# Patient Record
Sex: Female | Born: 1940
Health system: Southern US, Community
[De-identification: ages and names within clinical notes are randomized; demographics above are authoritative.]

## PROBLEM LIST (undated history)

## (undated) DIAGNOSIS — I10 Essential (primary) hypertension: Secondary | ICD-10-CM

## (undated) DIAGNOSIS — R569 Unspecified convulsions: Secondary | ICD-10-CM

## (undated) DIAGNOSIS — R112 Nausea with vomiting, unspecified: Secondary | ICD-10-CM

## (undated) DIAGNOSIS — Z9889 Other specified postprocedural states: Secondary | ICD-10-CM

## (undated) DIAGNOSIS — I639 Cerebral infarction, unspecified: Secondary | ICD-10-CM

## (undated) DIAGNOSIS — R479 Unspecified speech disturbances: Secondary | ICD-10-CM

## (undated) DIAGNOSIS — R011 Cardiac murmur, unspecified: Secondary | ICD-10-CM

## (undated) DIAGNOSIS — E785 Hyperlipidemia, unspecified: Secondary | ICD-10-CM

## (undated) DIAGNOSIS — J189 Pneumonia, unspecified organism: Secondary | ICD-10-CM

## (undated) DIAGNOSIS — E1140 Type 2 diabetes mellitus with diabetic neuropathy, unspecified: Secondary | ICD-10-CM

## (undated) DIAGNOSIS — K219 Gastro-esophageal reflux disease without esophagitis: Secondary | ICD-10-CM

## (undated) DIAGNOSIS — A498 Other bacterial infections of unspecified site: Secondary | ICD-10-CM

## (undated) DIAGNOSIS — R42 Dizziness and giddiness: Secondary | ICD-10-CM

## (undated) DIAGNOSIS — K8020 Calculus of gallbladder without cholecystitis without obstruction: Secondary | ICD-10-CM

## (undated) DIAGNOSIS — F17200 Nicotine dependence, unspecified, uncomplicated: Secondary | ICD-10-CM

## (undated) DIAGNOSIS — Z923 Personal history of irradiation: Secondary | ICD-10-CM

## (undated) DIAGNOSIS — C801 Malignant (primary) neoplasm, unspecified: Secondary | ICD-10-CM

## (undated) DIAGNOSIS — I6529 Occlusion and stenosis of unspecified carotid artery: Secondary | ICD-10-CM

## (undated) HISTORY — DX: Occlusion and stenosis of unspecified carotid artery: I65.29

## (undated) HISTORY — PX: KNEE SURGERY: SHX244

## (undated) HISTORY — DX: Hyperlipidemia, unspecified: E78.5

## (undated) HISTORY — DX: Nicotine dependence, unspecified, uncomplicated: F17.200

## (undated) HISTORY — PX: OTHER SURGICAL HISTORY: SHX169

## (undated) HISTORY — PX: EYE SURGERY: SHX253

## (undated) HISTORY — PX: CHOLECYSTECTOMY: SHX55

## (undated) HISTORY — DX: Other bacterial infections of unspecified site: A49.8

## (undated) HISTORY — DX: Type 2 diabetes mellitus with diabetic neuropathy, unspecified: E11.40

## (undated) HISTORY — DX: Calculus of gallbladder without cholecystitis without obstruction: K80.20

## (undated) HISTORY — DX: Personal history of irradiation: Z92.3

## (undated) HISTORY — DX: Essential (primary) hypertension: I10

## (undated) HISTORY — PX: COLONOSCOPY: SHX174

---

## 2001-03-06 ENCOUNTER — Encounter: Admission: RE | Admit: 2001-03-06 | Discharge: 2001-06-04 | Payer: Self-pay | Admitting: Family Medicine

## 2001-03-28 ENCOUNTER — Emergency Department (HOSPITAL_COMMUNITY): Admission: EM | Admit: 2001-03-28 | Discharge: 2001-03-28 | Payer: Self-pay | Admitting: *Deleted

## 2001-03-28 ENCOUNTER — Encounter: Payer: Self-pay | Admitting: *Deleted

## 2001-08-15 ENCOUNTER — Inpatient Hospital Stay (HOSPITAL_COMMUNITY): Admission: EM | Admit: 2001-08-15 | Discharge: 2001-08-17 | Payer: Self-pay | Admitting: Emergency Medicine

## 2001-08-15 ENCOUNTER — Encounter: Payer: Self-pay | Admitting: Emergency Medicine

## 2004-12-31 ENCOUNTER — Emergency Department (HOSPITAL_COMMUNITY): Admission: EM | Admit: 2004-12-31 | Discharge: 2004-12-31 | Payer: Self-pay | Admitting: Emergency Medicine

## 2006-02-27 ENCOUNTER — Ambulatory Visit: Payer: Self-pay | Admitting: Family Medicine

## 2006-03-01 ENCOUNTER — Ambulatory Visit: Payer: Self-pay | Admitting: Family Medicine

## 2006-03-04 ENCOUNTER — Encounter: Admission: RE | Admit: 2006-03-04 | Discharge: 2006-03-04 | Payer: Self-pay | Admitting: Family Medicine

## 2006-03-22 ENCOUNTER — Encounter (INDEPENDENT_AMBULATORY_CARE_PROVIDER_SITE_OTHER): Payer: Self-pay | Admitting: Specialist

## 2006-03-22 ENCOUNTER — Ambulatory Visit (HOSPITAL_COMMUNITY): Admission: RE | Admit: 2006-03-22 | Discharge: 2006-03-23 | Payer: Self-pay | Admitting: General Surgery

## 2006-04-16 ENCOUNTER — Ambulatory Visit: Payer: Self-pay | Admitting: Family Medicine

## 2006-05-29 ENCOUNTER — Ambulatory Visit: Payer: Self-pay | Admitting: Family Medicine

## 2006-09-05 ENCOUNTER — Ambulatory Visit: Payer: Self-pay | Admitting: Family Medicine

## 2006-12-04 ENCOUNTER — Ambulatory Visit: Payer: Self-pay | Admitting: Family Medicine

## 2007-04-03 ENCOUNTER — Ambulatory Visit: Payer: Self-pay | Admitting: Family Medicine

## 2007-06-05 ENCOUNTER — Ambulatory Visit: Payer: Self-pay | Admitting: Family Medicine

## 2007-08-29 ENCOUNTER — Ambulatory Visit: Payer: Self-pay | Admitting: Family Medicine

## 2007-09-04 ENCOUNTER — Ambulatory Visit: Payer: Self-pay | Admitting: Family Medicine

## 2007-09-05 ENCOUNTER — Ambulatory Visit: Payer: Self-pay | Admitting: Family Medicine

## 2007-09-08 ENCOUNTER — Ambulatory Visit: Payer: Self-pay | Admitting: Family Medicine

## 2007-10-10 ENCOUNTER — Ambulatory Visit: Payer: Self-pay | Admitting: Family Medicine

## 2007-12-30 ENCOUNTER — Ambulatory Visit: Payer: Self-pay | Admitting: Family Medicine

## 2008-04-21 ENCOUNTER — Ambulatory Visit: Payer: Self-pay | Admitting: Family Medicine

## 2008-08-30 ENCOUNTER — Ambulatory Visit: Payer: Self-pay | Admitting: Family Medicine

## 2009-06-27 ENCOUNTER — Ambulatory Visit: Payer: Self-pay | Admitting: Family Medicine

## 2009-06-29 ENCOUNTER — Ambulatory Visit: Payer: Self-pay | Admitting: Family Medicine

## 2009-08-04 ENCOUNTER — Ambulatory Visit: Payer: Self-pay | Admitting: Vascular Surgery

## 2009-10-26 ENCOUNTER — Ambulatory Visit: Payer: Self-pay | Admitting: Family Medicine

## 2010-02-01 ENCOUNTER — Ambulatory Visit: Payer: Self-pay | Admitting: Family Medicine

## 2010-06-02 ENCOUNTER — Ambulatory Visit: Payer: Self-pay | Admitting: Family Medicine

## 2010-08-06 ENCOUNTER — Encounter: Payer: Self-pay | Admitting: Family Medicine

## 2010-10-04 ENCOUNTER — Ambulatory Visit: Payer: Self-pay | Admitting: Family Medicine

## 2010-10-11 ENCOUNTER — Ambulatory Visit (INDEPENDENT_AMBULATORY_CARE_PROVIDER_SITE_OTHER): Payer: Medicare Other | Admitting: Family Medicine

## 2010-10-11 DIAGNOSIS — F17200 Nicotine dependence, unspecified, uncomplicated: Secondary | ICD-10-CM

## 2010-10-11 DIAGNOSIS — I10 Essential (primary) hypertension: Secondary | ICD-10-CM

## 2010-10-11 DIAGNOSIS — Z79899 Other long term (current) drug therapy: Secondary | ICD-10-CM

## 2010-10-11 DIAGNOSIS — E119 Type 2 diabetes mellitus without complications: Secondary | ICD-10-CM

## 2010-10-11 DIAGNOSIS — E785 Hyperlipidemia, unspecified: Secondary | ICD-10-CM

## 2010-12-01 NOTE — Op Note (Signed)
[name], [name] NO.:  [id number]   MEDICAL RECORD NO.:  [id number]          PATIENT TYPE:  AMB   LOCATION:  SDS                          FACILITY:  [facility]   PHYSICIAN:  Cherylynn Ridges, M.D.    DATE OF BIRTH:  1941/06/03   DATE OF PROCEDURE:  03/22/2006  DATE OF DISCHARGE:                                 OPERATIVE REPORT   SURGEON:  Cherylynn Ridges, M.D.   PREOPERATIVE DIAGNOSIS:  Symptomatic cholelithiasis in a diabetic.   POSTOPERATIVE DIAGNOSIS:  Symptomatic cholelithiasis in a diabetic.   PROCEDURE:  Laparoscopic cholecystectomy with cholangiogram.   ANESTHESIA:  General endotracheal.   ESTIMATED BLOOD LOSS:  Less than 20 cc.   COMPLICATIONS:  None.   CONDITION:  Stable.   FINDINGS:  Subacute cholecystitis with a large gallbladder stone in the neck  of the gallbladder.  Intraoperative cholangiogram was normal.   INDICATIONS FOR OPERATION:  The patient is a 71 year old with a very  symptomatic gallstone in the neck of the gallbladder, who comes in now for a  laparoscopic cholecystectomy.   OPERATION:  The patient was taken to the operating room and placed on the  table in a supine position.  After an adequate endotracheal anesthetic was  administered, she was prepped and draped in the usual sterile manner,  exposing the midline in the right upper quadrant.   A supraumbilical curvilinear incision was made using a #11 blade and taken  down to the midline fascia.  It was at this fascial level that Kocher clamps  were grabbed onto the fascia and an incision made between the Kocher clamps  using a #15 blade.  We then were able to bluntly dissect down into the  peritoneal cavity, and once we had opened into the peritoneal cavity  bluntly, we passed a pursestring suture of 0 Vicryl into the peritoneal  cavity with a Hasson cannula.  The pursestring suture was used to secure the  Hasson cannula in place.  We then insufflated carbon dioxide gas up to a  maximum intra-abdominal pressure of 15 mmHg.  Two right costal margin 5-mm  cannulae and a subxiphoid 11-12 mm cannula were passed under direct vision  into the peritoneal cavity.  Once they were all in place, the patient was  placed in reverse Trendelenburg, the left side was tilted down and then  dissection begun.   The dome of the gallbladder was grasped using a ratcheted grasper through  the lateral-most 5-mm cannula and retracted towards the anterior abdominal  wall in the right upper quadrant.  We then passed a second grasper onto the  infundibulum of the gallbladder, and then dissected out the peritoneum  overlying the triangle of Calot and the hepatoduodenal triangle.  Both the  cystic duct and the cystic artery were identified.  We clipped the cystic  artery proximally x2, and distally, and then transected it.  And then we  clipped the cystic duct along the gallbladder side and made a  cholecystodochotomy with the laparoscopic scissors, and then passed a Cook  catheter, which had been passed through  the anterior abdominal wall, into  the cholecystodochotomy.  We performed a cholangiogram, which showed good  flow into the duodenum, no evidence of intraductal filling defect, no  obstruction and good proximal flow.   Once the cholangiogram was completed, the clip holding the catheter in place  was removed, and then the cholangiocatheter removed without difficulty.  We  then clipped the distal cystic duct x2, and then transected it.  We  dissected out the gallbladder from its bed with minimal difficulty, and then  retrieved it from the supraumbilical site, having to enlarge both the  fascial and the skin incision because of the large size of the stone.  We  then placed a second suture at the fascial side of the periumbilical  incision, tying down the pursestring suture, and then placing in a second  suture.  We inspected the gallbladder bed and right upper quadrant for bile   staining and bleeding, and there was minimal of each.  We then irrigated  with a small amount of saline solution, aspirated all fluid and gas from  above the liver, and then removed all cannulae.   The skin at all sites was closed using a running subcuticular stitch of 4-0  Vicryl.  Marcaine 0.25% with epi was injected at all sites, and all needle  counts, sponge counts and instrument counts were correct, as we applied  sterile dressings to all wounds.      Cherylynn Ridges, M.D.  Electronically Signed     JOW/MEDQ  D:  03/22/2006  T:  03/22/2006  Job:  [id number]   cc:   Baird Lyons

## 2010-12-01 NOTE — Op Note (Signed)
[place]  Patient:    [name], [name] Visit Number: [id number] MRN: [id number]          Service Type: Attending:  Harvie Junior, M.D. Dictated by:   Harvie Junior, M.D. Proc. Date: 08/15/01                             Operative Report  PREOPERATIVE DIAGNOSIS:  Comminuted displaced patellar fracture.  POSTOPERATIVE DIAGNOSIS:  Comminuted displaced patellar fracture.  OPERATION PERFORMED:  Partial inferior pole patellectomy with suturing of the patellar tendon to the posterior aspect of the remaining patella.  SURGEON:  Harvie Junior, M.D.  ASSISTANT:  Currie Paris. Thedore Mins.  ANESTHESIA:  General.  INDICATIONS FOR PROCEDURE:  The patient is a 70 year old female with a long history of fallen and having a comminuted fracture of the patella.  She was evaluated and felt to have this comminuted fracture.  She was taken to the operating room for fixation versus excision and patellar tendon repair.  DESCRIPTION OF PROCEDURE:  The patient was taken to the operating room where adequate anesthesia was obtained with general anesthetic.  The patient was placed supine on the operating table.  The left leg was then prepped and draped in the usual sterile fashion.  Following arthroscopic exsanguination of the extremity, blood pressure tourniquet was inflated to 350 mHg.  Following this, a midline incision was made and the subcutaneous tissues dissected down to the level of the comminuted patellar fracture.  The patella was fractured in a comminuted way such that the portion that was articular was actually only about 10% of the articular surface.  It went in a diagonal direction from there but this was nonarticular.  I did not feel that these fragments could be attached in a way that would allow for early motion and given that there was only a 10% articular portion, I felt that repair of the patellar tendon to the remaining fragment was appropriate.   This was undertaken with #5 Ethibond sutures times three through the tendon, tied over four drill holes into the patella.  Excellent fixation was achieved and the patient could be flexed to about 90 without any significant tension on the repair.  At this point the retinaculum was closed medially and laterally.  The wounds were then copiously irrigated and suctioned dry.  A sterile compressive dressing was applied.  The patient was taken to the recovery room where she was noted to be in satisfactory condition.  Estimated blood loss for this procedure was none. Total tourniquet time was an hour and 10 minutes. Dictated by:   Harvie Junior, M.D. Attending:  Harvie Junior, M.D. DD:  11/05/01 TD:  11/06/01 Job: [id number]

## 2010-12-01 NOTE — Discharge Summary (Signed)
[place]  Patient:    [name], [name] Visit Number: [id number] MRN: [id number]          Service Type: MED Location: [phone] Attending Physician:  Milly Jakob Dictated by:   Currie Paris Thedore Mins. Admit Date:  08/15/2001 Discharge Date: 08/17/2001   CC:         Ronnald Nian, M.D.  Harvie Junior, M.D.   Discharge Summary  ADMITTING DIAGNOSES: 1. Displaced patella fracture left knee. 2. History of hypertension. 3. History of noninsulin-dependent diabetes mellitus.  DISCHARGE DIAGNOSES: 1. Displaced patella fracture left knee. 2. History of hypertension. 3. History of noninsulin-dependent diabetes mellitus.  PROCEDURES IN HOSPITAL:  Partial patellectomy left knee with extensor mechanism repair, Jodi Geralds, M.D. on August 15, 2001.  BRIEF HISTORY:  The patient is a pleasant 70 year old patient who is followed medically by Dr. Sharlot Gowda for hypertension and noninsulin-dependent diabetes. She was at work today and fell on some ice, striking her left knee. She had onset of pain and swelling in her left knee and had difficulty doing a straight leg raise. She came to the [facility] Emergency Room where her left leg was evaluated and x-rays confirmed a displaced comminuted inferior patellar fracture. She was brought to the operating room for open reduction internal fixation of her patellar fracture versus partial patellectomy and extensor mechanism repair depending upon the ability to fix the patella adequately and get it reduced to our liking. She was admitted for this.  LABORATORY AND ACCESSORY DATA:  EKG on admission showed normal sinus rhythm. Preoperative chest x-ray showed upper limits of normal heart size with pulmonary vascular congestion without definite edema.  Hemoglobin on admission was 15.5 with a hematocrit of 44.7. WBC was 13.5, indices were within normal limits. Protime on admission was 12.7  seconds with an INR of 0.9, PTT was 26. On the day of discharge on Coumadin and anti-thrombolytic therapy, her protime was 15.6 seconds with an INR of 1.3. Her BMET on admission was within normal limits other than an elevated glucose at 228.  HOSPITAL COURSE:  The patient underwent left knee surgery as well described in Dr. Luiz Blare operative note on August 15, 2001. Postoperatively she was put on Ancef 1 gram IV q.8h x 5 doses. She was also started on Coumadin and anti-thrombolytic therapy per pharmacy protocol for DVT prophylaxis as we felt that she would be somewhat limited in her activity and was at risk for DVT. Postoperatively she was seen by physical therapy for walker ambulation, partial weight bearing on the left, IV fluids were used to hydrate the patient. Foley catheter that was in place at the time of surgery was intact. On postoperative day #1, she was without complaints. She had no chest pain or shortness of breath. She was taking fluids without difficulty. She had a low grade fever of 99.9. Her vital signs were stable. She progressed with physical therapy and on postoperative day #2, she had moderate knee pain. She was taking fluids without difficulty. The Foley catheter was discontinued, and she voided without difficulty. She had spiked fever up to 100.8, and her vital signs were stable. Her protime was 15.6 seconds and INR 1.3. The left knee dressing was changed. Her IV and Foley catheter had been removed. She was discharged home that day after being seen by physical therapy. She was instructed to ambulate weight bearing as tolerated on the left with a knee mobilizer and a walker. She  was given a prescription for Percocet p.r.n. for pain and Coumadin per pharmacy protocol x1 month postoperatively. She will be set up for home health, physical therapy, and home health R.N. blood draws for protimes, and we will follow her up in our office in 2 weeks. She will also continue  her usual hypertension and diabetes medication by Dr. Richardo Hanks. Dictated by:   Currie Paris Thedore Mins. Attending Physician:  Milly Jakob DD:  09/03/01 TD:  09/03/01 Job: [id number]

## 2010-12-28 ENCOUNTER — Telehealth: Payer: Self-pay | Admitting: Family Medicine

## 2010-12-28 MED ORDER — VERAPAMIL HCL ER 180 MG PO TBCR: 180.0000 mg | EXTENDED_RELEASE_TABLET | Freq: Every day | ORAL | Status: DC

## 2010-12-28 MED ORDER — SITAGLIPTIN PHOS-METFORMIN HCL 50-1000 MG PO TABS: 1.0000 | ORAL_TABLET | Freq: Two times a day (BID) | ORAL | Status: DC

## 2010-12-28 NOTE — Telephone Encounter (Signed)
Verapamil and Janumet were called in

## 2011-01-04 ENCOUNTER — Encounter: Payer: Self-pay | Admitting: Family Medicine

## 2011-01-04 DIAGNOSIS — E1159 Type 2 diabetes mellitus with other circulatory complications: Secondary | ICD-10-CM | POA: Insufficient documentation

## 2011-01-04 DIAGNOSIS — I152 Hypertension secondary to endocrine disorders: Secondary | ICD-10-CM | POA: Insufficient documentation

## 2011-01-04 DIAGNOSIS — F17200 Nicotine dependence, unspecified, uncomplicated: Secondary | ICD-10-CM

## 2011-01-04 DIAGNOSIS — E1140 Type 2 diabetes mellitus with diabetic neuropathy, unspecified: Secondary | ICD-10-CM

## 2011-01-04 DIAGNOSIS — E785 Hyperlipidemia, unspecified: Secondary | ICD-10-CM

## 2011-01-04 DIAGNOSIS — E1169 Type 2 diabetes mellitus with other specified complication: Secondary | ICD-10-CM | POA: Insufficient documentation

## 2011-01-04 DIAGNOSIS — F17210 Nicotine dependence, cigarettes, uncomplicated: Secondary | ICD-10-CM | POA: Insufficient documentation

## 2011-01-04 DIAGNOSIS — E119 Type 2 diabetes mellitus without complications: Secondary | ICD-10-CM | POA: Insufficient documentation

## 2011-01-04 DIAGNOSIS — I10 Essential (primary) hypertension: Secondary | ICD-10-CM

## 2011-02-06 ENCOUNTER — Encounter: Payer: Self-pay | Admitting: Family Medicine

## 2011-02-09 ENCOUNTER — Ambulatory Visit (INDEPENDENT_AMBULATORY_CARE_PROVIDER_SITE_OTHER): Payer: Medicare Other | Admitting: Family Medicine

## 2011-02-09 ENCOUNTER — Encounter: Payer: Self-pay | Admitting: Family Medicine

## 2011-02-09 DIAGNOSIS — F17200 Nicotine dependence, unspecified, uncomplicated: Secondary | ICD-10-CM

## 2011-02-09 DIAGNOSIS — E119 Type 2 diabetes mellitus without complications: Secondary | ICD-10-CM

## 2011-02-09 DIAGNOSIS — E785 Hyperlipidemia, unspecified: Secondary | ICD-10-CM

## 2011-02-09 DIAGNOSIS — E1169 Type 2 diabetes mellitus with other specified complication: Secondary | ICD-10-CM

## 2011-02-09 DIAGNOSIS — I10 Essential (primary) hypertension: Secondary | ICD-10-CM

## 2011-02-09 DIAGNOSIS — E1159 Type 2 diabetes mellitus with other circulatory complications: Secondary | ICD-10-CM

## 2011-02-09 LAB — POCT GLYCOSYLATED HEMOGLOBIN (HGB A1C): Hemoglobin A1C: 6.9

## 2011-02-09 NOTE — Patient Instructions (Signed)
Continue on your present medications. Will discuss the smoking later

## 2011-02-09 NOTE — Progress Notes (Signed)
  Subjective:    Patient ID: [name], female    DOB: Dec 12, 1940, 70 y.o.   MRN: [id number]  HPI She is here for a diabetes recheck. She continues on medications listed in the chart. She continues to smoke and is not ready to quit. She checks her blood sugars 3 times per day. Next eye exam is in October per she checks her feet regularly . Her exercise is minimal. She has been under a lot of stress over the last several months handling her brother's estate.  Review of Systems     Objective:   Physical Exam Alert and in no distress. Hemoglobin A1c is 6.9       Assessment & Plan:   Diabetes. Hypertension. Hyperlipidemia. Cigarette abuse

## 2011-03-02 ENCOUNTER — Other Ambulatory Visit: Payer: Self-pay | Admitting: Family Medicine

## 2011-03-06 ENCOUNTER — Other Ambulatory Visit: Payer: Self-pay | Admitting: Family Medicine

## 2011-03-21 ENCOUNTER — Ambulatory Visit (INDEPENDENT_AMBULATORY_CARE_PROVIDER_SITE_OTHER): Payer: Medicare Other | Admitting: Medical

## 2011-03-21 ENCOUNTER — Encounter: Payer: Self-pay | Admitting: Medical

## 2011-03-21 VITALS — BP 122/78 | HR 68 | Temp 98.0°F | Resp 16 | Ht 67.0 in | Wt 152.0 lb

## 2011-03-21 DIAGNOSIS — R209 Unspecified disturbances of skin sensation: Secondary | ICD-10-CM

## 2011-03-21 DIAGNOSIS — F17200 Nicotine dependence, unspecified, uncomplicated: Secondary | ICD-10-CM

## 2011-03-21 DIAGNOSIS — G5620 Lesion of ulnar nerve, unspecified upper limb: Secondary | ICD-10-CM

## 2011-03-21 DIAGNOSIS — J329 Chronic sinusitis, unspecified: Secondary | ICD-10-CM

## 2011-03-21 DIAGNOSIS — R202 Paresthesia of skin: Secondary | ICD-10-CM

## 2011-03-21 MED ORDER — AMOXICILLIN 875 MG PO TABS: 875.0000 mg | ORAL_TABLET | Freq: Two times a day (BID) | ORAL | Status: AC

## 2011-03-21 NOTE — Progress Notes (Signed)
Subjective:   HPI [name] is a 70 y.o. female who presents for multiple c/o.  She notes two-week history of head pressure, head congestion, cough, sinus pressure, runny nose, scratchy throat, and right ear pressure.  The last time she had a sinus infection was 3 years ago, but thinks she has a sinus infection now. Her grandkids were in town recently to see her and all of them were sick.  She is using nothing for her symptoms currently.  She notes for the last 4 days having some intermittent numbness in her right fourth and fifth fingers.  At times the thumb has numbness as well.  She notes having similar symptoms in the remote past, but nothing in the last several months until the last 4 days.  She is right-handed, is on her computer a lot, plays video games and online poker, in addition to gardening and canning.  She also notes that she does rest her elbows and arms on the table or desk when on the computer.  She denies any unilateral weakness, no recent vision changes, slurred speech, memory change, or dizziness.  No other aggravating or relieving factors.  No other c/o.  The following portions of the patient's history were reviewed and updated as appropriate: allergies, current medications, past family history, past medical history, past social history, past surgical history and problem list.  Past Medical History  Diagnosis Date  . Hypertension   . Diabetes mellitus   . Neuropathy, diabetic   . Dyslipidemia   . Smoker    Review of Systems Gen.: No fever, chills, sweats, fatigue Allergies: No sneezing, itchy eyes Neck: No neck pain Lungs: + Cough; No shortness of breath, wheezing Heart: No chest pain, palpitations, lower extremity edema GI: No abdominal pain, nausea, vomiting Neuro: No slurred speech, mental status change, loss of consciousness, memory changes Muscle skeletal: No arthralgias, myalgias, joint swelling, gait changes    Objective:   Physical Exam  General  appearance: alert, no distress, WD/WN, white female Skin: Unremarkable HEENT: Conjunctiva normal, TMs pearly, nares patent, mild sinus tenderness, pharynx with mild erythema Neck: Nontender, normal range of motion, no bruit, supple, no thyromegaly Heart: RRR, normal S1, S2 no murmurs Lungs: Decreased breath sounds in general, but no rhonchi, wheezes, rales Extremities: No edema, no cyanosis, no clubbing Neuro: Alert and oriented x3, CN II through XII intact, normal strength and sensation throughout, DTRs normal, Phalen's and Tinel's negative, no gait abnormality, no focal signs    Assessment :    Encounter Diagnoses  Name Primary?  . Sinusitis Yes  . Ulnar nerve external compression syndrome   . Paresthesia   . Tobacco use disorder       Plan:     Sinusitis- prescription for amoxicillin today, rest, hydrate well, can use over-the-counter Mucinex DM. Cough not improving.  Ulnar nerve compression syndrome/paresthesias -  given her history and symptoms, I suspect between the computer use or resting her elbows on the table, and that she is putting pressure on her ulnar nerve. I suspect she has both mild ulnar nerve and carpal tunnel syndrome. Advise she avoid resting her arms and elbows on the desk, take frequent breaks when on the computer, abduct and wrap her right arm in a towel to keep it straight during sleep, and don't sleep on that side.  If this continues to worsen we can get EMG/nerve conduction studies.  I reassured her that her current symptoms and exam do not suggest stroke.  Discussed signs  of TIA/stroke, and when to call 911.  Tobacco use disorder- discussed the risk of smoking, and advise she stopped smoking. She notes that she is not ready to quit at this time, and she says that Dr. Susann Givens, her [facility], has spoken to her many times about her tobacco use.

## 2011-03-21 NOTE — Patient Instructions (Signed)
Sinus infection - increase your water intake.  Take OTC Mucinex DM or Coricidin HBP for congestion and cough.  Rest, and begin Amoxicillin twice daily for 10 days.  Call if you are not improving.  Finger numbness - your symptoms suggests Ulnar nerve syndrome and possibly mild carpal tunnel syndrome.  Avoid sitting with arms propped up with the elbow area resting on the table.  This may be worsening your symptoms.  Have your husband wrap your arm in a towel at night straight to avoid bending your arm in your sleep.  If this continues to worsen, let us know.  STOP SMOKING!!!!

## 2011-03-30 ENCOUNTER — Telehealth: Payer: Self-pay | Admitting: Family Medicine

## 2011-03-30 MED ORDER — FLUCONAZOLE 150 MG PO TABS: 150.0000 mg | ORAL_TABLET | Freq: Once | ORAL | Status: AC

## 2011-03-30 NOTE — Telephone Encounter (Signed)
Diflucan called in for yeast infection secondary to antibiotic use

## 2011-03-30 NOTE — Telephone Encounter (Signed)
Amoxil caused yeast infection, wants rx    [facility] [address] [place]

## 2011-03-30 NOTE — Telephone Encounter (Signed)
Pt called re antibiotic

## 2011-03-30 NOTE — Telephone Encounter (Signed)
Let her know that I called an antibiotic in 

## 2011-04-01 ENCOUNTER — Other Ambulatory Visit: Payer: Self-pay | Admitting: Family Medicine

## 2011-04-06 ENCOUNTER — Other Ambulatory Visit: Payer: Self-pay | Admitting: Family Medicine

## 2011-06-12 ENCOUNTER — Ambulatory Visit (INDEPENDENT_AMBULATORY_CARE_PROVIDER_SITE_OTHER): Payer: Medicare Other | Admitting: Family Medicine

## 2011-06-12 ENCOUNTER — Encounter: Payer: Self-pay | Admitting: Family Medicine

## 2011-06-12 DIAGNOSIS — E785 Hyperlipidemia, unspecified: Secondary | ICD-10-CM

## 2011-06-12 DIAGNOSIS — E1140 Type 2 diabetes mellitus with diabetic neuropathy, unspecified: Secondary | ICD-10-CM

## 2011-06-12 DIAGNOSIS — Z23 Encounter for immunization: Secondary | ICD-10-CM

## 2011-06-12 DIAGNOSIS — F17200 Nicotine dependence, unspecified, uncomplicated: Secondary | ICD-10-CM

## 2011-06-12 DIAGNOSIS — I10 Essential (primary) hypertension: Secondary | ICD-10-CM

## 2011-06-12 DIAGNOSIS — E1149 Type 2 diabetes mellitus with other diabetic neurological complication: Secondary | ICD-10-CM

## 2011-06-12 DIAGNOSIS — Z79899 Other long term (current) drug therapy: Secondary | ICD-10-CM

## 2011-06-12 DIAGNOSIS — E1142 Type 2 diabetes mellitus with diabetic polyneuropathy: Secondary | ICD-10-CM

## 2011-06-12 LAB — CBC WITH DIFFERENTIAL/PLATELET
Basophils Relative: 0 % (ref 0–1)
Hemoglobin: 13.2 g/dL (ref 12.0–15.0)
Lymphocytes Relative: 17 % (ref 12–46)
Lymphs Abs: 2.6 10*3/uL (ref 0.7–4.0)
MCHC: 33.2 g/dL (ref 30.0–36.0)
Monocytes Relative: 6 % (ref 3–12)
Neutro Abs: 11.5 10*3/uL — ABNORMAL HIGH (ref 1.7–7.7)
Neutrophils Relative %: 75 % (ref 43–77)
RBC: 4.35 MIL/uL (ref 3.87–5.11)
WBC: 15.3 10*3/uL — ABNORMAL HIGH (ref 4.0–10.5)

## 2011-06-12 LAB — COMPREHENSIVE METABOLIC PANEL
ALT: 8 U/L (ref 0–35)
AST: 12 U/L (ref 0–37)
BUN: 21 mg/dL (ref 6–23)
CO2: 23 mEq/L (ref 19–32)
Calcium: 9.8 mg/dL (ref 8.4–10.5)
Creat: 0.88 mg/dL (ref 0.50–1.10)
Total Bilirubin: 0.5 mg/dL (ref 0.3–1.2)

## 2011-06-12 LAB — POCT UA - MICROALBUMIN: Creatinine, POC: 87.6 mg/dL

## 2011-06-12 LAB — LIPID PANEL
Cholesterol: 188 mg/dL (ref 0–200)
HDL: 47 mg/dL (ref >39)
Total CHOL/HDL Ratio: 4 Ratio
VLDL: 32 mg/dL (ref 0–40)

## 2011-06-12 LAB — POCT GLYCOSYLATED HEMOGLOBIN (HGB A1C): Hemoglobin A1C: 7

## 2011-06-12 NOTE — Progress Notes (Signed)
  Subjective:    Patient ID: ARLYNN STARE, female    DOB: Aug 15, 1940, 70 y.o.   MRN: [id number]  HPI She is here for recheck. She has not taken her Levemir in the last 3 months due to cost. She is also been under a lot of stress dealing with the death of a brother and his estate. This is now settled. She continues to smoke. At this time she is not ready to quit. She does not exercise regularly. Review of the record indicates she needs followup with her ophthalmologist. She is not sure about her blood pressure medications.   Review of Systems     Objective:   Physical Exam Alert and in no distress. Hemoglobin A1c is 7.0       Assessment & Plan:   1. Diabetes mellitus with neuropathy  POCT HgB A1C, CBC with Differential, Comprehensive metabolic panel, Lipid panel, POCT UA - Microalbumin  2. Dyslipidemia  Lipid panel  3. HTN (hypertension)  CBC with Differential, Comprehensive metabolic panel  4. Smoker    5. Encounter for long-term (current) use of other medications  CBC with Differential, Comprehensive metabolic panel, Lipid panel   The majority of the encounter was spent discussing the stresses she has been under dealing with her brother and the estate. She is glad that this is behind her. Strongly encouraged her to get back to taking better care of yourself regard to her diabetes and in working on smoking cessation. Recheck here in several months.

## 2011-06-12 NOTE — Patient Instructions (Signed)
Continue on your present medications. Keep working on quitting smoking.

## 2011-06-13 ENCOUNTER — Other Ambulatory Visit: Payer: Self-pay | Admitting: Internal Medicine

## 2011-06-13 MED ORDER — ROSUVASTATIN CALCIUM 40 MG PO TABS: 40.0000 mg | ORAL_TABLET | Freq: Every day | ORAL | Status: DC

## 2011-06-13 NOTE — Telephone Encounter (Signed)
Increase crestor 40mg  per Dr. Susann Givens. Sent in to pharmacy

## 2011-07-04 ENCOUNTER — Other Ambulatory Visit: Payer: Self-pay | Admitting: Family Medicine

## 2011-07-24 ENCOUNTER — Other Ambulatory Visit: Payer: Self-pay | Admitting: Family Medicine

## 2011-08-28 ENCOUNTER — Other Ambulatory Visit: Payer: Self-pay | Admitting: Family Medicine

## 2011-09-03 ENCOUNTER — Other Ambulatory Visit: Payer: Self-pay | Admitting: Family Medicine

## 2011-10-04 ENCOUNTER — Other Ambulatory Visit: Payer: Self-pay | Admitting: Family Medicine

## 2011-10-10 ENCOUNTER — Ambulatory Visit (INDEPENDENT_AMBULATORY_CARE_PROVIDER_SITE_OTHER): Payer: Medicare Other | Admitting: Family Medicine

## 2011-10-10 ENCOUNTER — Encounter: Payer: Self-pay | Admitting: Family Medicine

## 2011-10-10 DIAGNOSIS — I10 Essential (primary) hypertension: Secondary | ICD-10-CM

## 2011-10-10 DIAGNOSIS — E119 Type 2 diabetes mellitus without complications: Secondary | ICD-10-CM

## 2011-10-10 DIAGNOSIS — E785 Hyperlipidemia, unspecified: Secondary | ICD-10-CM

## 2011-10-10 DIAGNOSIS — Z79899 Other long term (current) drug therapy: Secondary | ICD-10-CM

## 2011-10-10 DIAGNOSIS — F17200 Nicotine dependence, unspecified, uncomplicated: Secondary | ICD-10-CM

## 2011-10-10 LAB — LIPID PANEL
Cholesterol: 128 mg/dL (ref 0–200)
LDL Cholesterol: 47 mg/dL (ref 0–99)
Total CHOL/HDL Ratio: 2.5 Ratio
Triglycerides: 145 mg/dL (ref <150)
VLDL: 29 mg/dL (ref 0–40)

## 2011-10-10 NOTE — Progress Notes (Signed)
  Subjective:    Patient ID: [name], female    DOB: March 26, 1941, 71 y.o.   MRN: [id number]  HPI She is here for a diabetes recheck. She continues on medications listed in the chart. She does not check her feet regularly or her blood sugars. She continues to smoke and is not interested in quitting. He is not interested in having a colonoscopy done. She has been under a lot of stress dealing with recent family death and difficulties dealing with one of her brothers.  Review of Systems     Objective:   Physical Exam Alert and in no distress. Hemoglobin A1c is 7.5.       Assessment & Plan:   1. Diabetes mellitus  POCT HgB A1C  2. Dyslipidemia    3. HTN (hypertension)    4. Smoker     encouraged her to do a better job of taking care of her self in regard to diet and exercise. Did not discuss smoking since she is not interested in quitting.

## 2011-10-10 NOTE — Patient Instructions (Signed)
Get more physically active.

## 2011-10-11 ENCOUNTER — Encounter: Payer: Self-pay | Admitting: Internal Medicine

## 2011-12-04 ENCOUNTER — Other Ambulatory Visit: Payer: Self-pay | Admitting: Family Medicine

## 2011-12-30 ENCOUNTER — Other Ambulatory Visit: Payer: Self-pay | Admitting: Family Medicine

## 2012-01-03 ENCOUNTER — Other Ambulatory Visit: Payer: Self-pay | Admitting: Family Medicine

## 2012-02-05 ENCOUNTER — Other Ambulatory Visit: Payer: Self-pay | Admitting: Family Medicine

## 2012-02-07 ENCOUNTER — Ambulatory Visit (INDEPENDENT_AMBULATORY_CARE_PROVIDER_SITE_OTHER): Payer: Medicare Other | Admitting: Family Medicine

## 2012-02-07 ENCOUNTER — Encounter: Payer: Self-pay | Admitting: Family Medicine

## 2012-02-07 VITALS — BP 124/70 | HR 74 | Wt 156.0 lb

## 2012-02-07 DIAGNOSIS — I10 Essential (primary) hypertension: Secondary | ICD-10-CM

## 2012-02-07 DIAGNOSIS — E119 Type 2 diabetes mellitus without complications: Secondary | ICD-10-CM

## 2012-02-07 DIAGNOSIS — F17200 Nicotine dependence, unspecified, uncomplicated: Secondary | ICD-10-CM

## 2012-02-07 DIAGNOSIS — E785 Hyperlipidemia, unspecified: Secondary | ICD-10-CM

## 2012-02-07 NOTE — Progress Notes (Signed)
  Subjective:    Patient ID: [name], female    DOB: October 15, 1940, 71 y.o.   MRN: [id number]  HPI She is here for recheck. She does check her blood sugars 2 or 3 times per day. She notes that sometimes after eating it is in the low 200 range but she has not made any adjustments based on that. She admits to not exercising like she normally does. She continues on medications listed in the chart and is having no difficulty. She continues to smoke and at this time is not interested in quitting. She we'll get an eye exam in December. She does intermittently check her feet. She has no other concerns or complaints.   Review of Systems     Objective:   Physical Exam Alert and in no distress. Globin A1c is 8.0.       Assessment & Plan:   1. Diabetes mellitus  POCT glycosylated hemoglobin (Hb A1C)  2. Dyslipidemia    3. HTN (hypertension)    4. Smoker     discussed the fact that checking her blood sugar and then doing nothing about it is wasting time and money. Encouraged her to increase her physical activities. Encouraged her to check her blood sugars after she eats and adjust carbohydrates based on this.

## 2012-02-07 NOTE — Patient Instructions (Signed)
There is no need to check your blood sugar less sugar usually information appropriately.

## 2012-03-06 ENCOUNTER — Other Ambulatory Visit: Payer: Self-pay | Admitting: Family Medicine

## 2012-04-01 ENCOUNTER — Telehealth: Payer: Self-pay | Admitting: Internal Medicine

## 2012-04-01 NOTE — Telephone Encounter (Signed)
Pt uses [facility] diabetic supplies.   However pt has enough stuff for now and does not need a refill. Pt will call at a later time when she needs a refill on her supplies

## 2012-04-29 ENCOUNTER — Other Ambulatory Visit: Payer: Self-pay | Admitting: Family Medicine

## 2012-06-10 ENCOUNTER — Encounter: Payer: Self-pay | Admitting: Family Medicine

## 2012-06-10 ENCOUNTER — Ambulatory Visit (INDEPENDENT_AMBULATORY_CARE_PROVIDER_SITE_OTHER): Payer: Medicare Other | Admitting: Family Medicine

## 2012-06-10 VITALS — BP 150/90 | HR 83 | Temp 98.1°F | Wt 152.0 lb

## 2012-06-10 DIAGNOSIS — Z79899 Other long term (current) drug therapy: Secondary | ICD-10-CM

## 2012-06-10 DIAGNOSIS — E785 Hyperlipidemia, unspecified: Secondary | ICD-10-CM

## 2012-06-10 DIAGNOSIS — E1149 Type 2 diabetes mellitus with other diabetic neurological complication: Secondary | ICD-10-CM

## 2012-06-10 DIAGNOSIS — E1142 Type 2 diabetes mellitus with diabetic polyneuropathy: Secondary | ICD-10-CM

## 2012-06-10 DIAGNOSIS — Z23 Encounter for immunization: Secondary | ICD-10-CM

## 2012-06-10 DIAGNOSIS — I10 Essential (primary) hypertension: Secondary | ICD-10-CM

## 2012-06-10 DIAGNOSIS — E1140 Type 2 diabetes mellitus with diabetic neuropathy, unspecified: Secondary | ICD-10-CM

## 2012-06-10 DIAGNOSIS — F17200 Nicotine dependence, unspecified, uncomplicated: Secondary | ICD-10-CM

## 2012-06-10 LAB — POCT UA - MICROALBUMIN: Microalbumin Ur, POC: 14.1 mg/dL

## 2012-06-10 MED ORDER — INFLUENZA VIRUS VACC SPLIT PF IM SUSP: 0.5000 mL | Freq: Once | INTRAMUSCULAR | Status: DC

## 2012-06-10 NOTE — Progress Notes (Signed)
  Subjective:    Patient ID: [name], female    DOB: March 08, 1941, 71 y.o.   MRN: [id number]  HPI She is here for a recheck. She continues on medications listed in the chart. She does check her blood sugars usually twice per day. She remains active physically. She continues to smoke and is not interested in quitting. She does check her feet regularly. She has an eye exam scheduled in the near future.   Review of Systems     Objective:   Physical Exam Alert and in no distress. Hemoglobin A1c is 6.9       Assessment & Plan:   1. Diabetes mellitus with neuropathy  POCT glycosylated hemoglobin (Hb A1C), POCT UA - Microalbumin  2. Need for prophylactic vaccination and inoculation against influenza  influenza  inactive virus vaccine (FLUZONE/FLUARIX) injection 0.5 mL  3. HTN (hypertension)    4. Dyslipidemia    5. Smoker     flu shot given with benefits and risks discussed. Blood pressure is elevated today. I will check this again with her next visit. Previous readings were normal

## 2012-06-30 ENCOUNTER — Other Ambulatory Visit: Payer: Self-pay | Admitting: Family Medicine

## 2012-07-04 ENCOUNTER — Other Ambulatory Visit: Payer: Self-pay | Admitting: Family Medicine

## 2012-07-15 LAB — HM DIABETES EYE EXAM

## 2012-07-30 ENCOUNTER — Other Ambulatory Visit: Payer: Self-pay | Admitting: Family Medicine

## 2012-09-28 ENCOUNTER — Other Ambulatory Visit: Payer: Self-pay | Admitting: Family Medicine

## 2012-10-09 ENCOUNTER — Encounter: Payer: Self-pay | Admitting: Family Medicine

## 2012-10-09 ENCOUNTER — Ambulatory Visit (INDEPENDENT_AMBULATORY_CARE_PROVIDER_SITE_OTHER): Payer: Medicare Other | Admitting: Family Medicine

## 2012-10-09 VITALS — BP 120/80 | HR 77 | Wt 154.0 lb

## 2012-10-09 DIAGNOSIS — E785 Hyperlipidemia, unspecified: Secondary | ICD-10-CM

## 2012-10-09 DIAGNOSIS — Z79899 Other long term (current) drug therapy: Secondary | ICD-10-CM

## 2012-10-09 DIAGNOSIS — E1140 Type 2 diabetes mellitus with diabetic neuropathy, unspecified: Secondary | ICD-10-CM

## 2012-10-09 DIAGNOSIS — E1142 Type 2 diabetes mellitus with diabetic polyneuropathy: Secondary | ICD-10-CM

## 2012-10-09 DIAGNOSIS — E1149 Type 2 diabetes mellitus with other diabetic neurological complication: Secondary | ICD-10-CM

## 2012-10-09 DIAGNOSIS — F17200 Nicotine dependence, unspecified, uncomplicated: Secondary | ICD-10-CM

## 2012-10-09 DIAGNOSIS — I10 Essential (primary) hypertension: Secondary | ICD-10-CM

## 2012-10-09 LAB — COMPREHENSIVE METABOLIC PANEL
AST: 12 U/L (ref 0–37)
BUN: 18 mg/dL (ref 6–23)
Calcium: 10.7 mg/dL — ABNORMAL HIGH (ref 8.4–10.5)
Chloride: 101 mEq/L (ref 96–112)
Creat: 0.84 mg/dL (ref 0.50–1.10)
Total Bilirubin: 0.7 mg/dL (ref 0.3–1.2)

## 2012-10-09 LAB — CBC WITH DIFFERENTIAL/PLATELET
Basophils Absolute: 0 10*3/uL (ref 0.0–0.1)
Eosinophils Absolute: 0.3 10*3/uL (ref 0.0–0.7)
Eosinophils Relative: 2 % (ref 0–5)
HCT: 39.1 % (ref 36.0–46.0)
Lymphocytes Relative: 28 % (ref 12–46)
MCH: 29.6 pg (ref 26.0–34.0)
MCHC: 34.3 g/dL (ref 30.0–36.0)
MCV: 86.5 fL (ref 78.0–100.0)
Monocytes Absolute: 0.5 10*3/uL (ref 0.1–1.0)
Platelets: 419 10*3/uL — ABNORMAL HIGH (ref 150–400)
RDW: 14.6 % (ref 11.5–15.5)

## 2012-10-09 LAB — POCT GLYCOSYLATED HEMOGLOBIN (HGB A1C): Hemoglobin A1C: 7.8

## 2012-10-09 LAB — LIPID PANEL
Cholesterol: 209 mg/dL — ABNORMAL HIGH (ref 0–200)
HDL: 61 mg/dL (ref >39)
Total CHOL/HDL Ratio: 3.4 Ratio
Triglycerides: 190 mg/dL — ABNORMAL HIGH (ref <150)

## 2012-10-09 NOTE — Progress Notes (Deleted)
  Subjective:    Patient ID: [name], female    DOB: Feb 07, 1941, 72 y.o.   MRN: [id number]  HPI    Review of Systems     Objective:   Physical Exam        Assessment & Plan:

## 2012-10-09 NOTE — Progress Notes (Signed)
  Subjective:    [name] is a 72 y.o. female who presents for follow-up of Type 2 diabetes mellitus.    Home blood sugar records: fasting range: 130's, postprandial range: 170 and 130  Current symptoms/problems include none and have   been stable. Daily foot checks, foot concerns:  Last eye exam:     Medication compliance: Current diet: in general, a "healthy" diet   Current exercise: none Known diabetic complications: none Cardiovascular risk factors: advanced age (older than 86 for men, 28 for women), diabetes mellitus, dyslipidemia, hypertension and smoking/ tobacco exposure   The following portions of the patient's history were reviewed and updated as appropriate: allergies, current medications, past family history, past medical history, past social history, past surgical history and problem list.  ROS as in subjective above    Objective:    BP 120/80  Pulse 77  Wt 154 lb (69.854 kg)  BMI 24.11 kg/m2  SpO2 99%  Filed Vitals:   10/09/12 0920  BP: 120/80  Pulse: 77    General appearence: alert, no distress, WD/WN Neck: supple, no lymphadenopathy, no thyromegaly, no masses Heart: RRR, normal S1, S2, no murmurs Lungs: CTA bilaterally, no wheezes, rhonchi, or rales Abdomen: +bs, soft, non tender, non distended, no masses, no hepatomegaly, no splenomegaly Pulses: 2+ symmetric, upper and lower extremities, normal cap refill Ext: no edema Foot exam:  Neuro: foot monofilament exam normal   Lab Review Lab Results  Component Value Date   HGBA1C 7.8 10/09/2012   Lab Results  Component Value Date   CHOL 128 10/10/2011   HDL 52 10/10/2011   LDLCALC 47 10/10/2011   TRIG 145 10/10/2011   CHOLHDL 2.5 10/10/2011   No results found for this basenameConcepcion Elk     Chemistry      Component Value Date/Time   NA 136 06/12/2011 0931   K 4.6 06/12/2011 0931   CL 100 06/12/2011 0931   CO2 23 06/12/2011 0931   BUN 21 06/12/2011 0931   CREATININE 0.88  06/12/2011 0931      Component Value Date/Time   CALCIUM 9.8 06/12/2011 0931   ALKPHOS 71 06/12/2011 0931   AST 12 06/12/2011 0931   ALT 8 06/12/2011 0931   BILITOT 0.5 06/12/2011 0931        Chemistry      Component Value Date/Time   NA 136 06/12/2011 0931   K 4.6 06/12/2011 0931   CL 100 06/12/2011 0931   CO2 23 06/12/2011 0931   BUN 21 06/12/2011 0931   CREATININE 0.88 06/12/2011 0931      Component Value Date/Time   CALCIUM 9.8 06/12/2011 0931   ALKPHOS 71 06/12/2011 0931   AST 12 06/12/2011 0931   ALT 8 06/12/2011 0931   BILITOT 0.5 06/12/2011 0931       Last optometry/ophthalmology exam reviewed from:    Assessment:   Encounter Diagnoses  Name Primary?  . Diabetes mellitus with neuropathy Yes  . HTN (hypertension)   . Dyslipidemia   . Smoker   . Encounter for long-term (current) use of other medications    Plan:    1.  Rx changes: none 2.  Education: Reviewed 'ABCs' of diabetes management (respective goals in parentheses):  A1C (<7), blood pressure (<130/80), and cholesterol (LDL <100). 3.  Compliance at present is estimated to be fair. Efforts to improve compliance (if necessary) will be directed at increased exercise. 4. Follow up: 4 months

## 2012-10-09 NOTE — Patient Instructions (Addendum)
Time to increase your physical activity. When you're ready to  quit smoking I will work with you

## 2012-10-09 NOTE — Progress Notes (Signed)
Last eye exam:07/15/12 Dr.groat Last foot exam:10/10/11 Dr.Lalonde PT test B/S 2 times daily B/S running around 130's

## 2012-11-16 ENCOUNTER — Emergency Department (HOSPITAL_COMMUNITY): Payer: Medicare Other

## 2012-11-16 ENCOUNTER — Encounter (HOSPITAL_COMMUNITY): Payer: Self-pay | Admitting: Family Medicine

## 2012-11-16 ENCOUNTER — Observation Stay (HOSPITAL_COMMUNITY)
Admission: EM | Admit: 2012-11-16 | Discharge: 2012-11-17 | Disposition: A | Discharge status: Home / Self Care | Payer: Medicare Other | Attending: Internal Medicine | Admitting: Internal Medicine

## 2012-11-16 ENCOUNTER — Observation Stay (HOSPITAL_COMMUNITY): Payer: Medicare Other

## 2012-11-16 DIAGNOSIS — I10 Essential (primary) hypertension: Secondary | ICD-10-CM | POA: Insufficient documentation

## 2012-11-16 DIAGNOSIS — E1169 Type 2 diabetes mellitus with other specified complication: Secondary | ICD-10-CM | POA: Diagnosis present

## 2012-11-16 DIAGNOSIS — E1140 Type 2 diabetes mellitus with diabetic neuropathy, unspecified: Secondary | ICD-10-CM

## 2012-11-16 DIAGNOSIS — R29810 Facial weakness: Secondary | ICD-10-CM | POA: Insufficient documentation

## 2012-11-16 DIAGNOSIS — E1142 Type 2 diabetes mellitus with diabetic polyneuropathy: Secondary | ICD-10-CM | POA: Insufficient documentation

## 2012-11-16 DIAGNOSIS — E785 Hyperlipidemia, unspecified: Secondary | ICD-10-CM

## 2012-11-16 DIAGNOSIS — E119 Type 2 diabetes mellitus without complications: Secondary | ICD-10-CM | POA: Diagnosis present

## 2012-11-16 DIAGNOSIS — G459 Transient cerebral ischemic attack, unspecified: Principal | ICD-10-CM

## 2012-11-16 DIAGNOSIS — R209 Unspecified disturbances of skin sensation: Secondary | ICD-10-CM | POA: Insufficient documentation

## 2012-11-16 DIAGNOSIS — E1149 Type 2 diabetes mellitus with other diabetic neurological complication: Secondary | ICD-10-CM | POA: Insufficient documentation

## 2012-11-16 DIAGNOSIS — F17200 Nicotine dependence, unspecified, uncomplicated: Secondary | ICD-10-CM | POA: Insufficient documentation

## 2012-11-16 DIAGNOSIS — E1159 Type 2 diabetes mellitus with other circulatory complications: Secondary | ICD-10-CM | POA: Diagnosis present

## 2012-11-16 DIAGNOSIS — D72829 Elevated white blood cell count, unspecified: Secondary | ICD-10-CM | POA: Insufficient documentation

## 2012-11-16 LAB — CBC
HCT: 37.1 % (ref 36.0–46.0)
MCH: 30.5 pg (ref 26.0–34.0)
MCV: 86.3 fL (ref 78.0–100.0)
RBC: 4.3 MIL/uL (ref 3.87–5.11)
WBC: 12.5 10*3/uL — ABNORMAL HIGH (ref 4.0–10.5)

## 2012-11-16 LAB — CBC WITH DIFFERENTIAL/PLATELET
Basophils Absolute: 0.1 10*3/uL (ref 0.0–0.1)
Eosinophils Absolute: 0.4 10*3/uL (ref 0.0–0.7)
Eosinophils Relative: 3 % (ref 0–5)
Lymphocytes Relative: 26 % (ref 12–46)
Lymphs Abs: 3.3 10*3/uL (ref 0.7–4.0)
MCH: 30.6 pg (ref 26.0–34.0)
Neutrophils Relative %: 65 % (ref 43–77)
Platelets: 367 10*3/uL (ref 150–400)
RBC: 4.35 MIL/uL (ref 3.87–5.11)
RDW: 14.5 % (ref 11.5–15.5)
WBC: 12.8 10*3/uL — ABNORMAL HIGH (ref 4.0–10.5)

## 2012-11-16 LAB — CREATININE, SERUM: GFR calc Af Amer: 77 mL/min — ABNORMAL LOW (ref >90)

## 2012-11-16 LAB — BASIC METABOLIC PANEL
Calcium: 9.3 mg/dL (ref 8.4–10.5)
GFR calc non Af Amer: 60 mL/min — ABNORMAL LOW (ref >90)
Glucose, Bld: 181 mg/dL — ABNORMAL HIGH (ref 70–99)
Potassium: 3.5 mEq/L (ref 3.5–5.1)
Sodium: 140 mEq/L (ref 135–145)

## 2012-11-16 LAB — GLUCOSE, CAPILLARY: Glucose-Capillary: 136 mg/dL — ABNORMAL HIGH (ref 70–99)

## 2012-11-16 MED ORDER — ENOXAPARIN SODIUM 40 MG/0.4ML ~~LOC~~ SOLN
40.0000 mg | SUBCUTANEOUS | Status: DC
  Administered 2012-11-16: 40 mg via SUBCUTANEOUS
  Filled 2012-11-16 (×2): qty 0.4

## 2012-11-16 MED ORDER — NICOTINE 14 MG/24HR TD PT24
14.0000 mg | MEDICATED_PATCH | Freq: Every day | TRANSDERMAL | Status: DC
  Administered 2012-11-17: 14 mg via TRANSDERMAL
  Filled 2012-11-16 (×2): qty 1

## 2012-11-16 MED ORDER — ASPIRIN 325 MG PO TABS
325.0000 mg | ORAL_TABLET | Freq: Every day | ORAL | Status: DC
  Administered 2012-11-17: 325 mg via ORAL
  Filled 2012-11-16: qty 1

## 2012-11-16 MED ORDER — INSULIN ASPART 100 UNIT/ML ~~LOC~~ SOLN
0.0000 [IU] | Freq: Three times a day (TID) | SUBCUTANEOUS | Status: DC
  Administered 2012-11-17: 3 [IU] via SUBCUTANEOUS
  Administered 2012-11-17: 2 [IU] via SUBCUTANEOUS

## 2012-11-16 NOTE — ED Notes (Signed)
Per pt sts intermittently for the last month she has had facial tightness and numbness, HA, and vision changes. sts also some numbness in fingers. sts her eyes and mouth also twitch.

## 2012-11-16 NOTE — H&P (Signed)
[facility] History and Physical  [name]:[id number] DOB: 1941-01-03 DOA: 11/16/2012  Referring physician: Rolan Bucco, MD  PCP: Carollee Herter, MD   Chief Complaint:right side headache    HPI:  72 yr old F with hx of DM, HTN, who presents with frontal HA and facial numbness.She reports episodes lasting 15 minutes that include a "white light jumping" in her right eye, right facial numbness and tightness, and right UE numbness without weakness. Per family at bedside, she has had some right facial drooping to her mouth. She has had these episodes for about a month, intermittently, with three occuring once each day for the past 3 days. Currently she is asymptomatic. No nausea, chest pain, SOB. Has not taken her ASA for 3 weeks  She has also had some numbness in fingers. Sister states  her eyes and mouth also twitch.        Review of Systems: negative for the following  Constitutional: Denies fever, chills, diaphoresis, appetite change and fatigue.  HEENT: Denies photophobia, eye pain, redness, hearing loss, ear pain, congestion, sore throat, rhinorrhea, sneezing, mouth sores, trouble swallowing, neck pain, neck stiffness and tinnitus. Positive for visual disturbance  Respiratory: Denies SOB, DOE, cough, chest tightness, and wheezing.  Cardiovascular: Denies chest pain, palpitations and leg swelling.  Gastrointestinal: Denies nausea, vomiting, abdominal pain, diarrhea, constipation, blood in stool and abdominal distention.  Genitourinary: Denies dysuria, urgency, frequency, hematuria, flank pain and difficulty urinating.  Musculoskeletal: Denies myalgias, back pain, joint swelling, arthralgias and gait problem.  Skin: Denies pallor, rash and wound.  Neurological: Denies dizziness, seizures, syncope, weakness, light-headedness, Neurological: Positive for facial asymmetry, numbness and headaches   Hematological: Denies adenopathy. Easy bruising, personal or family  bleeding history  Psychiatric/Behavioral: Denies suicidal ideation, mood changes, confusion, nervousness, sleep disturbance and agitation       Past Medical History  Diagnosis Date  . Hypertension   . Diabetes mellitus   . Neuropathy, diabetic   . Dyslipidemia   . Smoker      Past Surgical History  Procedure Laterality Date  . Cholecystectomy        Social History:  reports that she has been smoking.  She does not have any smokeless tobacco history on file. She reports that she does not drink alcohol or use illicit drugs.    Allergies  Allergen Reactions  . Codeine Nausea And Vomiting     family history.reviewed and found to be negative    Prior to Admission medications   Medication Sig Start Date End Date Taking? Authorizing Provider  aspirin 81 MG tablet Take 81 mg by mouth daily.   Yes Historical Provider, MD  lisinopril-hydrochlorothiazide (PRINZIDE,ZESTORETIC) 20-12.5 MG per tablet take 1 tablet by mouth once daily 09/28/12  Yes Ronnald Nian, MD  sitaGLIPtan-metformin (JANUMET) 50-1000 MG per tablet Take 1 tablet by mouth 2 (two) times daily with a meal.   Yes Historical Provider, MD  verapamil (CALAN-SR) 240 MG CR tablet take 1 tablet by mouth once daily 07/30/12  Yes Ronnald Nian, MD     Physical Exam: Filed Vitals:   11/16/12 1436 11/16/12 1530 11/16/12 1649  BP: 172/67 134/54 120/51  Pulse: 100 83 98  Temp: 98.1 F (36.7 C)    TempSrc: Oral    Resp: 20 32 20  SpO2: 97% 95% 96%     Constitutional: Vital signs reviewed. Patient is a well-developed and well-nourished in no acute distress and cooperative with exam. Alert and oriented x3.  Head:  Normocephalic and atraumatic  Ear: TM normal bilaterally  Mouth: no erythema or exudates, MMM  Eyes: PERRL, EOMI, conjunctivae normal, No scleral icterus.  Neck: Supple, Trachea midline normal ROM, No JVD, mass, thyromegaly, or carotid bruit present.  Cardiovascular: RRR, S1 normal, S2 normal, no MRG,  pulses symmetric and intact bilaterally  Pulmonary/Chest: CTAB, no wheezes, rales, or rhonchi  Abdominal: Soft. Non-tender, non-distended, bowel sounds are normal, no masses, organomegaly, or guarding present.  GU: no CVA tenderness Musculoskeletal: No joint deformities, erythema, or stiffness, ROM full and no nontender Ext: no edema and no cyanosis, pulses palpable bilaterally (DP and PT)  Hematology: no cervical, inginal, or axillary adenopathy.  Neurological: A&O x3, Strenght is normal and symmetric bilaterally, cranial nerve II-XII are grossly intact, no focal motor deficit, sensory intact to light touch bilaterally.  Skin: Warm, dry and intact. No rash, cyanosis, or clubbing.  Psychiatric: Normal mood and affect. speech and behavior is normal. Judgment and thought content normal. Cognition and memory are normal.       Labs on Admission:    Basic Metabolic Panel:  Recent Labs Lab 11/16/12 1503  NA 140  K 3.5  CL 102  CO2 25  GLUCOSE 181*  BUN 16  CREATININE 0.93  CALCIUM 9.3   Liver Function Tests: No results found for this basename: AST, ALT, ALKPHOS, BILITOT, PROT, ALBUMIN,  in the last 168 hours No results found for this basename: LIPASE, AMYLASE,  in the last 168 hours No results found for this basename: AMMONIA,  in the last 168 hours CBC:  Recent Labs Lab 11/16/12 1503 11/16/12 1700  WBC 12.8* 12.5*  NEUTROABS 8.3*  --   HGB 13.3 13.1  HCT 38.2 37.1  MCV 87.8 86.3  PLT 367 361   Cardiac Enzymes: No results found for this basename: CKTOTAL, CKMB, CKMBINDEX, TROPONINI,  in the last 168 hours  BNP (last 3 results) No results found for this basename: PROBNP,  in the last 8760 hours    CBG: No results found for this basename: GLUCAP,  in the last 168 hours  Radiological Exams on Admission: Ct Head Wo Contrast  11/16/2012  *RADIOLOGY REPORT*  Clinical Data: Headache and forehead numbness.  CT HEAD WITHOUT CONTRAST  Technique:  Contiguous axial images  were obtained from the base of the skull through the vertex without contrast.  Comparison: None.  Findings: No evidence for acute hemorrhage, mass lesion, midline shift, hydrocephalus or large infarct.  There is subtle low density in the subcortical white matter which may represent chronic changes.  There is a small cavum septum pellucidum.  Visualized sinuses are clear.  No acute bony abnormality.  IMPRESSION: No acute intracranial abnormality.   Original Report Authenticated By: Richarda Overlie, M.D.     EKG: Independently reviewed. RBBB  Assessment/Plan Active Problems:   HTN (hypertension)   Diabetes mellitus with neuropathy   Dyslipidemia   TIA (transient ischemic attack)   TIA Full stroke work up  [employer] shows  Several scattered punctate nonspecific white matter type changes  most likely related to result of small vessel disease in this  diabetic hypertensive patient with hyperlipidemia ECHO, CAROTID LIPID PANEL ASA RESUME   DIABETES  WILL CHECK A1C, START SSI  NICOTINE DEPENDENCE  SMOKING SINCE AGE 23  NICOTINE PATCH   Code Status:   full Family Communication: bedside Disposition Plan: admit   Time spent: 70 mins   [facility] Pager [phone]  If 7PM-7AM, please contact night-coverage [email or web address] Password [facility] 11/16/2012,  5:28 PM

## 2012-11-16 NOTE — ED Provider Notes (Addendum)
Medical screening examination/treatment/procedure(s) were conducted as a shared visit with non-physician practitioner(s) and myself.  I personally evaluated the patient during the encounter  Pt with one month hx of intermittent right side headache with vision changes right eye, over last 3 days, now associated with right arm numbness and weakness and numbness to right side of face.  Usually lasts about 15 minutes.  2 spells today.  No deficits now.  Discussed with [facility] who will admit.   Date: 11/16/2012  Rate: 82  Rhythm: normal sinus rhythm  QRS Axis: normal  Intervals: normal  ST/T Wave abnormalities: nonspecific ST/T changes  Conduction Disutrbances:right bundle branch block  Narrative Interpretation:   Old EKG Reviewed: none available    Rolan Bucco, MD 11/16/12 1537  Rolan Bucco, MD 11/16/12 1621  Rolan Bucco, MD 11/16/12 [phone]

## 2012-11-16 NOTE — ED Provider Notes (Signed)
History     CSN: [id number]  Arrival date & time 11/16/12  1431   First MD Initiated Contact with Patient 11/16/12 1437      Chief Complaint  Patient presents with  . Headache  . Numbness    (Consider location/radiation/quality/duration/timing/severity/associated sxs/prior treatment) Patient is a 72 y.o. female presenting with headaches. The history is provided by the patient.  Headache Pain location:  Frontal Radiates to:  Face Associated symptoms: numbness   Associated symptoms: no fever   Associated symptoms comment:  She reports episodes lasting 15 minutes that include a "white light jumping" in her right eye, right facial numbness and tightness, and right UE numbness without weakness. Per family at bedside, she has had some right facial drooping to her mouth. She has had these episodes for about a month, intermittently, with three occuring once each day for the past 3 days. Currently she is asymptomatic. No nausea, chest pain, SOB.   Past Medical History  Diagnosis Date  . Hypertension   . Diabetes mellitus   . Neuropathy, diabetic   . Dyslipidemia   . Smoker     Past Surgical History  Procedure Laterality Date  . Cholecystectomy      History reviewed. No pertinent family history.  History  Substance Use Topics  . Smoking status: Current Every Day Smoker  . Smokeless tobacco: Not on file  . Alcohol Use: No    OB History   Grav Para Term Preterm Abortions TAB SAB Ect Mult Living                  Review of Systems  Constitutional: Negative for fever and chills.  Eyes: Positive for visual disturbance.  Respiratory: Negative.   Cardiovascular: Negative.   Gastrointestinal: Negative.   Musculoskeletal: Negative.   Skin: Negative.   Neurological: Positive for facial asymmetry, numbness and headaches.    Allergies  Codeine  Home Medications   Current Outpatient Rx  Name  Route  Sig  Dispense  Refill  . aspirin 81 MG tablet   Oral   Take 81 mg by  mouth daily.         . CRESTOR 40 MG tablet      take 1 tablet by mouth once daily   30 tablet   1   . JANUMET 50-1000 MG per tablet      TAKE 1 TABLET 2 TIMES A DAY WITH A MEAL   60 each   6     Refill Approved   . lisinopril-hydrochlorothiazide (PRINZIDE,ZESTORETIC) 20-12.5 MG per tablet      take 1 tablet by mouth once daily   30 tablet   2   . verapamil (CALAN-SR) 240 MG CR tablet      take 1 tablet by mouth once daily   60 tablet   6     BP 172/67  Pulse 100  Temp(Src) 98.1 F (36.7 C) (Oral)  Resp 20  SpO2 97%  Physical Exam  Constitutional: She is oriented to person, place, and time. She appears well-developed and well-nourished.  HENT:  Head: Normocephalic.  Eyes: Pupils are equal, round, and reactive to light.  Neck: Normal range of motion. Neck supple.  Cardiovascular: Normal rate and regular rhythm.   Pulmonary/Chest: Effort normal and breath sounds normal.  Abdominal: Soft. Bowel sounds are normal. There is no tenderness. There is no rebound and no guarding.  Musculoskeletal: Normal range of motion.  Neurological: She is alert and oriented to person, place, and  time. She has normal strength and normal reflexes. No sensory deficit. She displays a negative Romberg sign. Coordination normal.  No neurologic deficits. Cranial nerves 3-12 grossly intact. Ambulatory, steady.   Skin: Skin is warm and dry. No rash noted.  Psychiatric: She has a normal mood and affect.    ED Course  Procedures (including critical care time)  Labs Reviewed  CBC WITH DIFFERENTIAL  BASIC METABOLIC PANEL   No results found.   No diagnosis found.    MDM  Patient care transferred to Dr. Arta Silence, PA-C 11/16/12 1507

## 2012-11-17 ENCOUNTER — Telehealth: Payer: Self-pay | Admitting: Family Medicine

## 2012-11-17 DIAGNOSIS — E1142 Type 2 diabetes mellitus with diabetic polyneuropathy: Secondary | ICD-10-CM

## 2012-11-17 DIAGNOSIS — E785 Hyperlipidemia, unspecified: Secondary | ICD-10-CM

## 2012-11-17 DIAGNOSIS — E1149 Type 2 diabetes mellitus with other diabetic neurological complication: Secondary | ICD-10-CM

## 2012-11-17 DIAGNOSIS — D72829 Elevated white blood cell count, unspecified: Secondary | ICD-10-CM | POA: Diagnosis present

## 2012-11-17 DIAGNOSIS — I10 Essential (primary) hypertension: Secondary | ICD-10-CM

## 2012-11-17 LAB — GLUCOSE, CAPILLARY: Glucose-Capillary: 211 mg/dL — ABNORMAL HIGH (ref 70–99)

## 2012-11-17 LAB — HEMOGLOBIN A1C
Hgb A1c MFr Bld: 6.9 % — ABNORMAL HIGH (ref <5.7)
Hgb A1c MFr Bld: 7.3 % — ABNORMAL HIGH (ref <5.7)
Mean Plasma Glucose: 151 mg/dL — ABNORMAL HIGH (ref <117)
Mean Plasma Glucose: 163 mg/dL — ABNORMAL HIGH (ref <117)

## 2012-11-17 LAB — LIPID PANEL: LDL Cholesterol: 110 mg/dL — ABNORMAL HIGH (ref 0–99)

## 2012-11-17 MED ORDER — INSULIN GLARGINE 100 UNIT/ML ~~LOC~~ SOLN
5.0000 [IU] | Freq: Every day | SUBCUTANEOUS | Status: DC
  Filled 2012-11-17: qty 0.05

## 2012-11-17 MED ORDER — ACETAMINOPHEN 325 MG PO TABS: 650.0000 mg | ORAL_TABLET | Freq: Four times a day (QID) | ORAL | Status: DC | PRN

## 2012-11-17 MED ORDER — SIMVASTATIN 40 MG PO TABS
40.0000 mg | ORAL_TABLET | Freq: Every day | ORAL | Status: DC
  Filled 2012-11-17: qty 1

## 2012-11-17 MED ORDER — LOVASTATIN 40 MG PO TABS: 40.0000 mg | ORAL_TABLET | Freq: Every day | ORAL | Status: DC

## 2012-11-17 NOTE — Progress Notes (Signed)
VASCULAR LAB PRELIMINARY  PRELIMINARY  PRELIMINARY  PRELIMINARY  Carotid Dopplers completed.    Preliminary report:  There is no significant right ICA stenosis.  There is 40-59% left ICA stenosis.  Bilateral vertebral artery flow is antegrade.  Codee Bloodworth, RVT 11/17/2012, 12:07 PM

## 2012-11-17 NOTE — Telephone Encounter (Signed)
Pt daughter called and states pt has been admitted to hospital and wanted you to know.

## 2012-11-17 NOTE — Evaluation (Signed)
Physical Therapy Evaluation Patient Details Name: [name] MRN: [id number] DOB: [birth year] Today's Date: 11/17/2012 Time: 7829-5621 PT Time Calculation (min): 20 min  PT Assessment / Plan / Recommendation Clinical Impression  Pt adm with TIA with RUE and Rt facial weakness. Symptoms have resolved and pt reports she is back to baseline. She scored 23/24 on Dynamic Gait Index and WNL for Romberg with eyes closed. Reviewed signs and symptoms of CVA and need to call 911 if symptoms return.     PT Assessment  Patent does not need any further PT services    Follow Up Recommendations  No PT follow up    Does the patient have the potential to tolerate intense rehabilitation      Barriers to Discharge        Equipment Recommendations  None recommended by PT    Recommendations for Other Services     Frequency      Precautions / Restrictions     Pertinent Vitals/Pain Denied pain; no apparent distress      Mobility  Bed Mobility Bed Mobility: Supine to Sit;Sitting - Scoot to Edge of Bed Supine to Sit: 7: Independent Sitting - Scoot to [place] of Bed: 7: Independent Transfers Transfers: Sit to Stand;Stand to Sit Sit to Stand: 7: Independent Stand to Sit: 7: Independent Ambulation/Gait Ambulation/Gait Assistance: 7: Independent Ambulation Distance (Feet): 400 Feet Assistive device: None Ambulation/Gait Assistance Details: see DGI; WNL Gait Pattern: Within Functional Limits Gait velocity: >2.62 ft/sec (community ambulator) Stairs: Yes Stairs Assistance: 7: Independent Stair Management Technique: No rails;Forwards Number of Stairs: 3 Modified Rankin (Stroke Patients Only) Pre-Morbid Rankin Score: No symptoms Modified Rankin: No symptoms       Visit Information  Last PT Received On: 11/17/12 Assistance Needed: +1    Subjective Data  Subjective: I've had the light in my eye thing for years...the numbness is new Patient Stated Goal: home today   Prior Functioning  Home Living Lives With: Spouse Available Help at Discharge: Family;Available 24 hours/day Type of Home: House Home Access: Stairs to enter [employer] of Steps: 1 Entrance Stairs-Rails: None Home Layout: One level Bathroom Shower/Tub: Engineer, manufacturing systems: Standard Bathroom Accessibility: Yes How Accessible: Accessible via walker Home Adaptive Equipment: Walker - rolling Additional Comments: used walker in past when she hurt her knee Prior Function Level of Independence: Independent Able to Take Stairs?: Reciprically Driving: Yes Vocation: Retired Musician: No difficulties Dominant Hand: Right    Cognition  Cognition Arousal/Alertness: Awake/alert Behavior During Therapy: WFL for tasks assessed/performed Overall Cognitive Status: Within Functional Limits for tasks assessed    Extremity/Trunk Assessment Right Lower Extremity Assessment RLE ROM/Strength/Tone: WFL for tasks assessed RLE Sensation: WFL - Light Touch RLE Coordination: WFL - gross motor Left Lower Extremity Assessment LLE ROM/Strength/Tone: WFL for tasks assessed LLE Sensation: WFL - Light Touch LLE Coordination: WFL - gross motor Trunk Assessment Trunk Assessment: Normal   Balance Balance Balance Assessed: Yes Static Standing Balance Rhomberg - Eyes Opened: 30 Rhomberg - Eyes Closed: 30 Standardized Balance Assessment Standardized Balance Assessment: Dynamic Gait Index Dynamic Gait Index Level Surface: Normal Change in Gait Speed: Normal Gait with Horizontal Head Turns: Normal Gait with Vertical Head Turns: Normal Gait and Pivot Turn: Normal Step Over Obstacle: Mild Impairment Step Around Obstacles: Normal Steps: Normal Total Score: 23 High Level Balance High Level Balance Activites: Direction changes;Turns;Sudden stops;Head turns;Other (comment) (reaching) High Level Balance Comments: no loss of balance  End of Session PT - End of Session Equipment  Utilized During Treatment: Gait belt Activity Tolerance: Patient tolerated treatment well Patient left: in bed;with call bell/phone within reach;with family/visitor present Nurse Communication: Mobility status (no further PT needs)  GP Functional Assessment Tool Used: clinical judgement Functional Limitation: Mobility: Walking and moving around Mobility: Walking and Moving Around Current Status ([id number]): 0 percent impaired, limited or restricted Mobility: Walking and Moving Around Goal Status ([id number]): 0 percent impaired, limited or restricted Mobility: Walking and Moving Around Discharge Status [phone]): 0 percent impaired, limited or restricted   Yaira Bernardi 11/17/2012, 10:59 AM  11/17/2012 Veda Canning, PT Pager: [phone]

## 2012-11-17 NOTE — Progress Notes (Signed)
[facility] PROGRESS NOTE Assessment/Plan:  TIA (transient ischemic attack) - MRI /MRA 5.3.2014: negative for a stroke - HbgA1c 6.9, see below. - LDL > 100 start statin. - carotid doppler, ECHO pending. - Resume ASA, not compliant.  HTN (hypertension) - stable, cont home meds.  Diabetes mellitus with neuropathy - HbgA1c good a control. - pt excersing. Follow up as an out patient.   Code Status: full Family Communication: daughter  Disposition Plan: observation   Consultants:  neuro  Procedures:  MRI  carotid  Antibiotics:  none (indicate start date, and stop date if known)  HPI/Subjective: No complains symptoms resolved.  Objective: Filed Vitals:   11/16/12 1854 11/16/12 2200 11/17/12 0200 11/17/12 0600  BP: 91/43 119/53 115/45 119/54  Pulse: 79 69 65 70  Temp: 98 F (36.7 C) 97.4 F (36.3 C) 97.8 F (36.6 C) 98.2 F (36.8 C)  TempSrc: Oral Oral Oral Oral  Resp: 18 18 18 18   Height:      Weight:      SpO2: 97% 98% 97% 98%   No intake or output data in the 24 hours ending 11/17/12 0850 Filed Weights   11/16/12 1844  Weight: 68.04 kg (150 lb)    Exam:  General: Alert, awake, oriented x3, in no acute distress.  HEENT: No bruits, no goiter.  Heart: Regular rate and rhythm, without murmurs, rubs, gallops.  Lungs: Good air movement, clear.  Abdomen: Soft, nontender, nondistended, positive bowel sounds.  Neuro: Grossly intact, nonfocal.   Data Reviewed: Basic Metabolic Panel:  Recent Labs Lab 11/16/12 1503 11/16/12 1700  NA 140  --   K 3.5  --   CL 102  --   CO2 25  --   GLUCOSE 181*  --   BUN 16  --   CREATININE 0.93 0.86  CALCIUM 9.3  --    Liver Function Tests: No results found for this basename: AST, ALT, ALKPHOS, BILITOT, PROT, ALBUMIN,  in the last 168 hours No results found for this basename: LIPASE, AMYLASE,  in the last 168 hours No results found for this basename: AMMONIA,  in the last 168 hours CBC:  Recent  Labs Lab 11/16/12 1503 11/16/12 1700  WBC 12.8* 12.5*  NEUTROABS 8.3*  --   HGB 13.3 13.1  HCT 38.2 37.1  MCV 87.8 86.3  PLT 367 361   Cardiac Enzymes: No results found for this basename: CKTOTAL, CKMB, CKMBINDEX, TROPONINI,  in the last 168 hours BNP (last 3 results) No results found for this basename: PROBNP,  in the last 8760 hours CBG:  Recent Labs Lab 11/16/12 1759 11/16/12 2110 11/17/12 0631  GLUCAP 136* 211* 155*    No results found for this or any previous visit (from the past 240 hour(s)).   Studies: Ct Head Wo Contrast  11/16/2012  *RADIOLOGY REPORT*  Clinical Data: Headache and forehead numbness.  CT HEAD WITHOUT CONTRAST  Technique:  Contiguous axial images were obtained from the base of the skull through the vertex without contrast.  Comparison: None.  Findings: No evidence for acute hemorrhage, mass lesion, midline shift, hydrocephalus or large infarct.  There is subtle low density in the subcortical white matter which may represent chronic changes.  There is a small cavum septum pellucidum.  Visualized sinuses are clear.  No acute bony abnormality.  IMPRESSION: No acute intracranial abnormality.   Original Report Authenticated By: Richarda Overlie, M.D.    Mri Brain Without Contrast  11/16/2012  *RADIOLOGY REPORT*  Clinical Data:  Right-sided  lip and hand numbness for past month. Diabetic hypertensive patient with hyperlipidemia.  Smoker.  MRI BRAIN WITHOUT CONTRAST MRA HEAD WITHOUT CONTRAST  Technique: Multiplanar, multiecho pulse sequences of the brain and surrounding structures were obtained according to standard protocol without intravenous contrast.  Angiographic images of the head were obtained using MRA technique without contrast.  Comparison: 11/16/2012 head CT.  No comparison brain MR.  MRI HEAD  Findings:  No acute infarct.  No intracranial hemorrhage.  Several scattered punctate nonspecific white matter type changes most likely related to result of small vessel  disease in this diabetic hypertensive patient with hyperlipidemia.  Mild atrophy without hydrocephalus.  No intracranial mass lesion detected on this unenhanced exam.  Major intracranial vascular structures are patent.  IMPRESSION: No acute infarct.  Mild small vessel disease type changes.  MRA HEAD  Findings: Anterior circulation without medium or large size vessel significant stenosis or occlusion.  Ectatic vertebral arteries and basilar artery.  No high-grade stenosis.  Nonvisualization PICAs and left AICA.  Mild branch vessel irregularity.  No aneurysm or vascular malformation.  IMPRESSION: Mild intracranial atherosclerotic type changes as noted above.   Original Report Authenticated By: Lacy Duverney, M.D.    Mr Mra Head/brain Wo Cm  11/16/2012  *RADIOLOGY REPORT*  Clinical Data:  Right-sided lip and hand numbness for past month. Diabetic hypertensive patient with hyperlipidemia.  Smoker.  MRI BRAIN WITHOUT CONTRAST MRA HEAD WITHOUT CONTRAST  Technique: Multiplanar, multiecho pulse sequences of the brain and surrounding structures were obtained according to standard protocol without intravenous contrast.  Angiographic images of the head were obtained using MRA technique without contrast.  Comparison: 11/16/2012 head CT.  No comparison brain MR.  MRI HEAD  Findings:  No acute infarct.  No intracranial hemorrhage.  Several scattered punctate nonspecific white matter type changes most likely related to result of small vessel disease in this diabetic hypertensive patient with hyperlipidemia.  Mild atrophy without hydrocephalus.  No intracranial mass lesion detected on this unenhanced exam.  Major intracranial vascular structures are patent.  IMPRESSION: No acute infarct.  Mild small vessel disease type changes.  MRA HEAD  Findings: Anterior circulation without medium or large size vessel significant stenosis or occlusion.  Ectatic vertebral arteries and basilar artery.  No high-grade stenosis.  Nonvisualization  PICAs and left AICA.  Mild branch vessel irregularity.  No aneurysm or vascular malformation.  IMPRESSION: Mild intracranial atherosclerotic type changes as noted above.   Original Report Authenticated By: Lacy Duverney, M.D.     Scheduled Meds: . aspirin  325 mg Oral Daily  . enoxaparin (LOVENOX) injection  40 mg Subcutaneous Q24H  . insulin aspart  0-15 Units Subcutaneous TID WC  . nicotine  14 mg Transdermal Daily   Continuous Infusions:    Marinda Elk  [facility] Pager [phone]. If 8PM-8AM, please contact night-coverage at [email or web address], password [facility] 11/17/2012, 8:50 AM  LOS: 1 day

## 2012-11-17 NOTE — Discharge Summary (Signed)
Physician Discharge Summary  [name] AVW:[id number] DOB: [birth year] DOA: 11/16/2012  PCP: Carollee Herter, MD  Admit date: 11/16/2012 Discharge date: 11/17/2012  Time spent: 35 minutes  Recommendations for Outpatient Follow-up:  1. Follow up with PCP check HbgA1c in 3 month. Titrate meds as needed.  Discharge Diagnoses:  Active Problems:   HTN (hypertension)   Diabetes mellitus with neuropathy   Dyslipidemia   TIA (transient ischemic attack)   Leukocytosis   Discharge Condition: stable  Diet recommendation: carb modified.  Filed Weights   11/16/12 1844  Weight: 68.04 kg (150 lb)    History of present illness:  72 yr old F with hx of DM, HTN, who presents with frontal HA and facial numbness.She reports episodes lasting 15 minutes that include a "white light jumping" in her right eye, right facial numbness and tightness, and right UE numbness without weakness. Per family at bedside, she has had some right facial drooping to her mouth. She has had these episodes for about a month, intermittently, with three occuring once each day for the past 3 days. Currently she is asymptomatic. No nausea, chest pain, SOB. Has not taken her ASA for 3 weeks  She has also had some numbness in fingers. Sister states her eyes and mouth also twitch.    Hospital Course:  TIA (transient ischemic attack)  - MRI /MRA 5.3.2014: negative for a stroke  - HbgA1c 6.9, see below.  - LDL > 100 start statin.  - carotid doppler, ECHO pending.  - Resume ASA, pt non complaint with statins and ASA, counseled.  HTN (hypertension)  - stable, cont home meds.   Diabetes mellitus with neuropathy  - HbgA1c good a control.  - pt excersing. Follow up as an out patient.   Procedures:  MRI  Carotid 5.5.2014: 40-59% left ICA stenosis. Echo .5.5.2014: estimated ejection fraction was in the range of 60% to 65%    Consultations:  none  Discharge Exam: Filed Vitals:   11/17/12 0200 11/17/12 0600  11/17/12 1056 11/17/12 1454  BP: 115/45 119/54 132/72 124/69  Pulse: 65 70 72 67  Temp: 97.8 F (36.6 C) 98.2 F (36.8 C) 98.2 F (36.8 C) 98.3 F (36.8 C)  TempSrc: Oral Oral Oral Oral  Resp: 18 18 18 18   Height:      Weight:      SpO2: 97% 98% 98% 96%    General: see progress note.  Discharge Instructions  Discharge Orders   Future Appointments Provider Department Dept Phone   02/10/2013 8:15 AM Ronnald Nian, MD [facility] [phone]   Future Orders Complete By Expires     Diet - low sodium heart healthy  As directed     Increase activity slowly  As directed         Medication List    TAKE these medications       aspirin 81 MG tablet  Take 81 mg by mouth daily.     lisinopril-hydrochlorothiazide 20-12.5 MG per tablet  Commonly known as:  PRINZIDE,ZESTORETIC  take 1 tablet by mouth once daily     lovastatin 40 MG tablet  Commonly known as:  MEVACOR  Take 1 tablet (40 mg total) by mouth at bedtime.     sitaGLIPtan-metformin 50-1000 MG per tablet  Commonly known as:  JANUMET  Take 1 tablet by mouth 2 (two) times daily with a meal.     verapamil 240 MG CR tablet  Commonly known as:  CALAN-SR  take 1 tablet  by mouth once daily       Allergies  Allergen Reactions  . Codeine Nausea And Vomiting       Follow-up Information   Follow up with Carollee Herter, MD In 3 weeks. (hospital follow up)    Contact information:   [address] [place] [address] [phone]        The results of significant diagnostics from this hospitalization (including imaging, microbiology, ancillary and laboratory) are listed below for reference.    Significant Diagnostic Studies: Ct Head Wo Contrast  11/16/2012  *RADIOLOGY REPORT*  Clinical Data: Headache and forehead numbness.  CT HEAD WITHOUT CONTRAST  Technique:  Contiguous axial images were obtained from the base of the skull through the vertex without contrast.  Comparison: None.   Findings: No evidence for acute hemorrhage, mass lesion, midline shift, hydrocephalus or large infarct.  There is subtle low density in the subcortical white matter which may represent chronic changes.  There is a small cavum septum pellucidum.  Visualized sinuses are clear.  No acute bony abnormality.  IMPRESSION: No acute intracranial abnormality.   Original Report Authenticated By: Richarda Overlie, M.D.    Mri Brain Without Contrast  11/16/2012  *RADIOLOGY REPORT*  Clinical Data:  Right-sided lip and hand numbness for past month. Diabetic hypertensive patient with hyperlipidemia.  Smoker.  MRI BRAIN WITHOUT CONTRAST MRA HEAD WITHOUT CONTRAST  Technique: Multiplanar, multiecho pulse sequences of the brain and surrounding structures were obtained according to standard protocol without intravenous contrast.  Angiographic images of the head were obtained using MRA technique without contrast.  Comparison: 11/16/2012 head CT.  No comparison brain MR.  MRI HEAD  Findings:  No acute infarct.  No intracranial hemorrhage.  Several scattered punctate nonspecific white matter type changes most likely related to result of small vessel disease in this diabetic hypertensive patient with hyperlipidemia.  Mild atrophy without hydrocephalus.  No intracranial mass lesion detected on this unenhanced exam.  Major intracranial vascular structures are patent.  IMPRESSION: No acute infarct.  Mild small vessel disease type changes.  MRA HEAD  Findings: Anterior circulation without medium or large size vessel significant stenosis or occlusion.  Ectatic vertebral arteries and basilar artery.  No high-grade stenosis.  Nonvisualization PICAs and left AICA.  Mild branch vessel irregularity.  No aneurysm or vascular malformation.  IMPRESSION: Mild intracranial atherosclerotic type changes as noted above.   Original Report Authenticated By: Lacy Duverney, M.D.    Mr Mra Head/brain Wo Cm  11/16/2012  *RADIOLOGY REPORT*  Clinical Data:  Right-sided  lip and hand numbness for past month. Diabetic hypertensive patient with hyperlipidemia.  Smoker.  MRI BRAIN WITHOUT CONTRAST MRA HEAD WITHOUT CONTRAST  Technique: Multiplanar, multiecho pulse sequences of the brain and surrounding structures were obtained according to standard protocol without intravenous contrast.  Angiographic images of the head were obtained using MRA technique without contrast.  Comparison: 11/16/2012 head CT.  No comparison brain MR.  MRI HEAD  Findings:  No acute infarct.  No intracranial hemorrhage.  Several scattered punctate nonspecific white matter type changes most likely related to result of small vessel disease in this diabetic hypertensive patient with hyperlipidemia.  Mild atrophy without hydrocephalus.  No intracranial mass lesion detected on this unenhanced exam.  Major intracranial vascular structures are patent.  IMPRESSION: No acute infarct.  Mild small vessel disease type changes.  MRA HEAD  Findings: Anterior circulation without medium or large size vessel significant stenosis or occlusion.  Ectatic vertebral arteries and basilar artery.  No  high-grade stenosis.  Nonvisualization PICAs and left AICA.  Mild branch vessel irregularity.  No aneurysm or vascular malformation.  IMPRESSION: Mild intracranial atherosclerotic type changes as noted above.   Original Report Authenticated By: Lacy Duverney, M.D.     Microbiology: No results found for this or any previous visit (from the past 240 hour(s)).   Labs: Basic Metabolic Panel:  Recent Labs Lab 11/16/12 1503 11/16/12 1700  NA 140  --   K 3.5  --   CL 102  --   CO2 25  --   GLUCOSE 181*  --   BUN 16  --   CREATININE 0.93 0.86  CALCIUM 9.3  --    Liver Function Tests: No results found for this basename: AST, ALT, ALKPHOS, BILITOT, PROT, ALBUMIN,  in the last 168 hours No results found for this basename: LIPASE, AMYLASE,  in the last 168 hours No results found for this basename: AMMONIA,  in the last 168  hours CBC:  Recent Labs Lab 11/16/12 1503 11/16/12 1700  WBC 12.8* 12.5*  NEUTROABS 8.3*  --   HGB 13.3 13.1  HCT 38.2 37.1  MCV 87.8 86.3  PLT 367 361   Cardiac Enzymes: No results found for this basename: CKTOTAL, CKMB, CKMBINDEX, TROPONINI,  in the last 168 hours BNP: BNP (last 3 results) No results found for this basename: PROBNP,  in the last 8760 hours CBG:  Recent Labs Lab 11/16/12 1759 11/16/12 2110 11/17/12 0631 11/17/12 1239  GLUCAP 136* 211* 155* 143*       Signed:  Marinda Elk  [facility] 11/17/2012, 5:04 PM

## 2012-11-17 NOTE — Evaluation (Signed)
Occupational Therapy Evaluation Patient Details Name: [name] MRN: [id number] DOB: 1940/10/20 Today's Date: 11/17/2012 Time: 4098-1191 OT Time Calculation (min): 25 min  OT Assessment / Plan / Recommendation Clinical Impression  Pt doing well following episode of RUE and Rt facial weakness,symptoms have since resolved and pt reports she is back to baseline. Pt is indpendent with all ADLs and ADL mobility. All education completed and no further acute or follow up OT services needed at this time, OT will sign off    OT Assessment  Patient does not need any further OT services    Follow Up Recommendations  No OT follow up    Barriers to Discharge  None    Equipment Recommendations  None recommended by OT    Recommendations for Other Services  None  Frequency       Precautions / Restrictions Precautions Precautions: None Restrictions Weight Bearing Restrictions: No       ADL  Grooming: Performed;Wash/dry hands;Wash/dry face;Independent Where Assessed - Grooming: Unsupported standing Upper Body Bathing: Simulated;Independent Lower Body Bathing: Simulated;Independent Upper Body Dressing: Performed;Independent Lower Body Dressing: Performed;Independent Toilet Transfer: Performed;Independent Toilet Transfer Method: Sit to Barista: Regular height toilet Toileting - Clothing Manipulation and Hygiene: Independent;Performed Where Assessed - Engineer, mining and Hygiene: Standing Tub/Shower Transfer: Performed;Independent Tub/Shower Transfer Method: Ambulating    OT Diagnosis:    OT Problem List:   OT Treatment Interventions:     OT Goals    Visit Information  Last OT Received On: 11/17/12    Subjective Data  Subjective: " I wish they would tell me about these tests so I can go home ' Patient Stated Goal: To return home   Prior Functioning     Home Living Lives With: Spouse Available Help at Discharge: Family;Available  24 hours/day Type of Home: House Home Access: Stairs to enter [employer] of Steps: 1 Entrance Stairs-Rails: None Home Layout: One level Bathroom Shower/Tub: Engineer, manufacturing systems: Standard Bathroom Accessibility: Yes How Accessible: Accessible via walker Home Adaptive Equipment: Walker - rolling Additional Comments: used walker in past when she hurt her knee Prior Function Level of Independence: Independent Able to Take Stairs?: Reciprically Driving: Yes Vocation: Retired Musician: No difficulties Dominant Hand: Right         Vision/Perception Vision - History Baseline Vision: Wears glasses only for reading Patient Visual Report: No change from baseline   Cognition  Cognition Arousal/Alertness: Awake/alert Behavior During Therapy: WFL for tasks assessed/performed Overall Cognitive Status: Within Functional Limits for tasks assessed    Extremity/Trunk Assessment Right Upper Extremity Assessment RUE ROM/Strength/Tone: WFL for tasks assessed RUE Sensation: WFL - Light Touch;WFL - Proprioception RUE Coordination: WFL - gross/fine motor Left Upper Extremity Assessment LUE ROM/Strength/Tone: WFL for tasks assessed LUE Sensation: WFL - Light Touch LUE Coordination: WFL - gross/fine motor     Mobility Bed Mobility Bed Mobility: Supine to Sit;Sitting - Scoot to Edge of Bed Supine to Sit: 7: Independent Sitting - Scoot to [place] of Bed: 7: Independent Transfers Transfers: Sit to Stand;Stand to Sit Sit to Stand: 7: Independent Stand to Sit: 7: Independent     Exercise     Balance Balance Balance Assessed: Yes Static Standing Balance Static Standing - Balance Support: No upper extremity supported Static Standing - Level of Assistance: 7: Independent Dynamic Standing Balance Dynamic Standing - Balance Support: No upper extremity supported;During functional activity Dynamic Standing - Level of Assistance: 7: Independent   End  of Session OT - End  of Session Activity Tolerance: Patient tolerated treatment well Patient left: in bed;with call bell/phone within reach;with family/visitor present;Other (comment) (seated EOB)  GO Functional Limitation: Self care Self Care Current Status (Z6109): 0 percent impaired, limited or restricted Self Care Goal Status (U0454): 0 percent impaired, limited or restricted Self Care Discharge Status [id number]): 0 percent impaired, limited or restricted   Galen Manila 11/17/2012, 2:53 PM

## 2012-11-17 NOTE — Plan of Care (Signed)
Problem: Phase III Progression Outcomes Goal: Discharge plan remains appropriate-arrangements made No follow up OT services recommended at this time

## 2012-11-18 NOTE — Care Management Note (Signed)
    Page 1 of 1   11/18/2012     7:53:45 AM   CARE MANAGEMENT NOTE 11/18/2012  Patient:  [name], [name]   Account Number:  [id number]  Date Initiated:  11/17/2012  Documentation initiated by:  [facility]  Subjective/Objective Assessment:   admitted for TIA workup     Action/Plan:   PT/OT evals- no PT follow or equipment   Anticipated DC Date:  11/18/2012   Anticipated DC Plan:  HOME/SELF CARE      DC Planning Services  CM consult      Choice offered to / List presented to:             Status of service:  Completed, signed off Medicare Important Message given?   (If response is "NO", the following Medicare IM given date fields will be blank) Date Medicare IM given:   Date Additional Medicare IM given:    Discharge Disposition:  HOME/SELF CARE  Per UR Regulation:  Reviewed for med. necessity/level of care/duration of stay  If discussed at Long Length of Stay Meetings, dates discussed:    Comments:

## 2012-12-15 ENCOUNTER — Ambulatory Visit (INDEPENDENT_AMBULATORY_CARE_PROVIDER_SITE_OTHER): Payer: Medicare Other | Admitting: Family Medicine

## 2012-12-15 ENCOUNTER — Encounter: Payer: Self-pay | Admitting: Family Medicine

## 2012-12-15 VITALS — BP 142/74 | HR 76 | Wt 150.0 lb

## 2012-12-15 DIAGNOSIS — E785 Hyperlipidemia, unspecified: Secondary | ICD-10-CM

## 2012-12-15 DIAGNOSIS — G459 Transient cerebral ischemic attack, unspecified: Secondary | ICD-10-CM

## 2012-12-15 DIAGNOSIS — I10 Essential (primary) hypertension: Secondary | ICD-10-CM

## 2012-12-15 MED ORDER — ROSUVASTATIN CALCIUM 20 MG PO TABS: 20.0000 mg | ORAL_TABLET | Freq: Every day | ORAL | Status: DC

## 2012-12-15 NOTE — Progress Notes (Signed)
  Subjective:    Patient ID: [name], female    DOB: [birth year], 72 y.o.   MRN: [id number]  HPI ffor a recheck. She was hospitalized in early May and treated for TIA. The record was reviewed. Her MRI and MRA were negative. She was also followup with me in roughly 3 months but is here for consult. They did switch from Crestor to lovastatin. Blood work was reviewed and did show an LDL of 110. Presently she is having no symptoms and continues on medications listed in the chart.   Review of Systems     Objective:   Physical Exam Alert and in no distress. EOMI. Other cranial nerves grossly intact.       Assessment & Plan:  HTN (hypertension)  TIA (transient ischemic attack)  Dyslipidemia I will switch her back to Crestor since her LDL was 110 and I doubt lovastatin will be able to get that low. She is to continue on her other medications and recheck here in 2 months.

## 2012-12-27 ENCOUNTER — Other Ambulatory Visit: Payer: Self-pay | Admitting: Family Medicine

## 2013-02-10 ENCOUNTER — Ambulatory Visit (INDEPENDENT_AMBULATORY_CARE_PROVIDER_SITE_OTHER): Payer: Medicare Other | Admitting: Family Medicine

## 2013-02-10 ENCOUNTER — Encounter: Payer: Self-pay | Admitting: Family Medicine

## 2013-02-10 VITALS — BP 130/72 | HR 81 | Wt 155.0 lb

## 2013-02-10 DIAGNOSIS — E1140 Type 2 diabetes mellitus with diabetic neuropathy, unspecified: Secondary | ICD-10-CM

## 2013-02-10 DIAGNOSIS — E1149 Type 2 diabetes mellitus with other diabetic neurological complication: Secondary | ICD-10-CM

## 2013-02-10 DIAGNOSIS — I10 Essential (primary) hypertension: Secondary | ICD-10-CM

## 2013-02-10 DIAGNOSIS — F17200 Nicotine dependence, unspecified, uncomplicated: Secondary | ICD-10-CM

## 2013-02-10 DIAGNOSIS — E785 Hyperlipidemia, unspecified: Secondary | ICD-10-CM

## 2013-02-10 DIAGNOSIS — E1142 Type 2 diabetes mellitus with diabetic polyneuropathy: Secondary | ICD-10-CM

## 2013-02-10 LAB — POCT GLYCOSYLATED HEMOGLOBIN (HGB A1C): Hemoglobin A1C: 7.2

## 2013-02-10 MED ORDER — SITAGLIPTIN PHOS-METFORMIN HCL 50-1000 MG PO TABS: 1.0000 | ORAL_TABLET | Freq: Two times a day (BID) | ORAL | Status: DC

## 2013-02-10 NOTE — Progress Notes (Signed)
  Subjective:    [name] is a 72 y.o. female who presents for follow-up of Type 2 diabetes mellitus.    Home blood sugar records: 150 avg  Current symptoms/problems no Daily foot checks, foot concerns: yes/no Last eye exam:  07/15/12   Medication compliance: Excellent Current diet: no, eating a lot of vegetables from her garden Current exercise: working in garden, gym Known diabetic complications: none Cardiovascular risk factors: advanced age (older than 75 for men, 70 for women), diabetes mellitus, dyslipidemia, hypertension and smoking/ tobacco exposure   The following portions of the patient's history were reviewed and updated as appropriate: allergies, current medications, past family history, past medical history, past social history and problem list.  ROS as in subjective above    Objective:    BP 130/72  Pulse 81  Wt 155 lb (70.308 kg)  BMI 24.27 kg/m2  Filed Vitals:   02/10/13 0807  BP: 130/72  Pulse: 81    General appearence: alert, no distress, WD/WN Neck: supple, no lymphadenopathy, no thyromegaly, no masses Heart: RRR, normal S1, S2, no murmurs Lungs: CTA bilaterally, no wheezes, rhonchi, or rales Abdomen: +bs, soft, non tender, non distended, no masses, no hepatomegaly, no splenomegaly Pulses: 2+ symmetric, upper and lower extremities, normal cap refill Ext: no edema Foot exam:  Neuro: foot monofilament exam normal   Lab Review Lab Results  Component Value Date   HGBA1C 7.3* 11/16/2012   Lab Results  Component Value Date   CHOL 196 11/17/2012   HDL 49 11/17/2012   LDLCALC 161* 11/17/2012   TRIG 185* 11/17/2012   CHOLHDL 4.0 11/17/2012   No results found for this basenameConcepcion Elk     Chemistry      Component Value Date/Time   NA 140 11/16/2012 1503   K 3.5 11/16/2012 1503   CL 102 11/16/2012 1503   CO2 25 11/16/2012 1503   BUN 16 11/16/2012 1503   CREATININE 0.86 11/16/2012 1700   CREATININE 0.84 10/09/2012 1009      Component Value  Date/Time   CALCIUM 9.3 11/16/2012 1503   ALKPHOS 56 10/09/2012 1009   AST 12 10/09/2012 1009   ALT 11 10/09/2012 1009   BILITOT 0.7 10/09/2012 1009        Chemistry      Component Value Date/Time   NA 140 11/16/2012 1503   K 3.5 11/16/2012 1503   CL 102 11/16/2012 1503   CO2 25 11/16/2012 1503   BUN 16 11/16/2012 1503   CREATININE 0.86 11/16/2012 1700   CREATININE 0.84 10/09/2012 1009      Component Value Date/Time   CALCIUM 9.3 11/16/2012 1503   ALKPHOS 56 10/09/2012 1009   AST 12 10/09/2012 1009   ALT 11 10/09/2012 1009   BILITOT 0.7 10/09/2012 1009     She refuses to have a colonoscopy  Hemoglobin A1c is 7.2    Assessment:   Encounter Diagnoses  Name Primary?  . Diabetes mellitus with neuropathy Yes  . Dyslipidemia   . HTN (hypertension)   . Smoker          Plan:    1.  Rx changes: none 2.  Education: Reviewed 'ABCs' of diabetes management (respective goals in parentheses):  A1C (<7), blood pressure (<130/80), and cholesterol (LDL <100). 3.  Compliance at present is estimated to be good. Efforts to improve compliance (if necessary) will be directed at Encouraged her to quit smoking. 4. Follow up: 4 months

## 2013-04-17 ENCOUNTER — Other Ambulatory Visit: Payer: Medicare Other

## 2013-06-09 ENCOUNTER — Encounter: Payer: Self-pay | Admitting: Family Medicine

## 2013-06-09 ENCOUNTER — Ambulatory Visit (INDEPENDENT_AMBULATORY_CARE_PROVIDER_SITE_OTHER): Payer: Medicare Other | Admitting: Family Medicine

## 2013-06-09 ENCOUNTER — Other Ambulatory Visit: Payer: Self-pay

## 2013-06-09 VITALS — BP 150/70 | HR 86 | Ht 66.5 in | Wt 148.0 lb

## 2013-06-09 DIAGNOSIS — E1140 Type 2 diabetes mellitus with diabetic neuropathy, unspecified: Secondary | ICD-10-CM

## 2013-06-09 DIAGNOSIS — I10 Essential (primary) hypertension: Secondary | ICD-10-CM

## 2013-06-09 DIAGNOSIS — E119 Type 2 diabetes mellitus without complications: Secondary | ICD-10-CM

## 2013-06-09 DIAGNOSIS — Z23 Encounter for immunization: Secondary | ICD-10-CM

## 2013-06-09 DIAGNOSIS — E785 Hyperlipidemia, unspecified: Secondary | ICD-10-CM

## 2013-06-09 DIAGNOSIS — F17200 Nicotine dependence, unspecified, uncomplicated: Secondary | ICD-10-CM

## 2013-06-09 MED ORDER — SITAGLIPTIN PHOS-METFORMIN HCL 50-1000 MG PO TABS: 1.0000 | ORAL_TABLET | Freq: Two times a day (BID) | ORAL | Status: DC

## 2013-06-09 NOTE — Telephone Encounter (Signed)
REORDERED DIABETES MED

## 2013-06-09 NOTE — Progress Notes (Signed)
  Subjective:    [name] is a 72 y.o. female who presents for follow-up of Type 2 diabetes mellitus.    Home blood sugar records: 170  Current symptoms/problems NONE Daily foot checks:  Any foot concerns: no Last eye exam:  2013/11. She has an appointment in December.   Medication compliance: Good Current diet: NONE Current exercise: TAKING CARE OF GRANDBABY ALL DAY KEEPS HER BUSY Known diabetic complications: none Cardiovascular risk factors: advanced age (older than 33 for men, 67 for women), diabetes mellitus, sedentary lifestyle and smoking/ tobacco exposure She has no plans to quit smoking  The following portions of the patient's history were reviewed and updated as appropriate: allergies, current medications, past medical history, past social history and problem list.  ROS as in subjective above    Objective:     General appearence: alert, no distress, WD/WN Foot exam:  Neuro: foot monofilament exam normal There is a previous history of possible neuropathy however at this point there is no data indicating that. Hemoglobin A1c 7.0 Lab Review Lab Results  Component Value Date   HGBA1C 7.2 02/10/2013   Lab Results  Component Value Date   CHOL 196 11/17/2012   HDL 49 11/17/2012   LDLCALC 161* 11/17/2012   TRIG 185* 11/17/2012   CHOLHDL 4.0 11/17/2012   No results found for this basename: Concepcion Elk     Chemistry      Component Value Date/Time   NA 140 11/16/2012 1503   K 3.5 11/16/2012 1503   CL 102 11/16/2012 1503   CO2 25 11/16/2012 1503   BUN 16 11/16/2012 1503   CREATININE 0.86 11/16/2012 1700   CREATININE 0.84 10/09/2012 1009      Component Value Date/Time   CALCIUM 9.3 11/16/2012 1503   ALKPHOS 56 10/09/2012 1009   AST 12 10/09/2012 1009   ALT 11 10/09/2012 1009   BILITOT 0.7 10/09/2012 1009        Chemistry      Component Value Date/Time   NA 140 11/16/2012 1503   K 3.5 11/16/2012 1503   CL 102 11/16/2012 1503   CO2 25 11/16/2012 1503   BUN 16 11/16/2012  1503   CREATININE 0.86 11/16/2012 1700   CREATININE 0.84 10/09/2012 1009      Component Value Date/Time   CALCIUM 9.3 11/16/2012 1503   ALKPHOS 56 10/09/2012 1009   AST 12 10/09/2012 1009   ALT 11 10/09/2012 1009   BILITOT 0.7 10/09/2012 1009       Assessment:   Encounter Diagnoses  Name Primary?  . Type II or unspecified type diabetes mellitus without mention of complication, not stated as uncontrolled Yes  . Need for prophylactic vaccination and inoculation against influenza   . HTN (hypertension)   . Dyslipidemia   . Smoker          Plan:    1.  Rx changes: none 2.  Education: Reviewed 'ABCs' of diabetes management (respective goals in parentheses):  A1C (<7), blood pressure (<130/80), and cholesterol (LDL <100). 3.  Compliance at present is estimated to be good. Efforts to improve compliance (if necessary) will be directed at increased exercise. 4. Follow up: 4 months  Lower extremity Doppler will also be ordered.

## 2013-06-15 ENCOUNTER — Ambulatory Visit (HOSPITAL_COMMUNITY)
Admission: RE | Admit: 2013-06-15 | Discharge: 2013-06-15 | Disposition: A | Discharge status: Home / Self Care | Payer: Medicare Other | Source: Ambulatory Visit | Attending: Family Medicine | Admitting: Family Medicine

## 2013-06-15 ENCOUNTER — Other Ambulatory Visit (HOSPITAL_COMMUNITY): Payer: Self-pay | Admitting: Vascular Surgery

## 2013-06-15 ENCOUNTER — Ambulatory Visit (HOSPITAL_COMMUNITY): Payer: Medicare Other

## 2013-06-15 ENCOUNTER — Other Ambulatory Visit (HOSPITAL_COMMUNITY): Payer: Self-pay | Admitting: Family Medicine

## 2013-06-15 DIAGNOSIS — I10 Essential (primary) hypertension: Secondary | ICD-10-CM | POA: Insufficient documentation

## 2013-06-15 DIAGNOSIS — R0989 Other specified symptoms and signs involving the circulatory and respiratory systems: Secondary | ICD-10-CM

## 2013-06-15 DIAGNOSIS — E785 Hyperlipidemia, unspecified: Secondary | ICD-10-CM | POA: Insufficient documentation

## 2013-06-15 DIAGNOSIS — A498 Other bacterial infections of unspecified site: Secondary | ICD-10-CM

## 2013-06-15 DIAGNOSIS — F17200 Nicotine dependence, unspecified, uncomplicated: Secondary | ICD-10-CM | POA: Insufficient documentation

## 2013-06-15 DIAGNOSIS — E119 Type 2 diabetes mellitus without complications: Secondary | ICD-10-CM

## 2013-06-15 HISTORY — DX: Other bacterial infections of unspecified site: A49.8

## 2013-06-15 NOTE — Progress Notes (Addendum)
VASCULAR LAB PRELIMINARY  ABI completed:    RIGHT    LEFT    PRESSURE WAVEFORM  PRESSURE WAVEFORM  BRACHIAL 144 Tri BRACHIAL 130 Tri  DP   DP    AT 122 Bi AT 134 Bi  PT 143 Bi PT 135 Bi  PER   PER    GREAT TOE  NA GREAT TOE  NA    RIGHT LEFT  ABI 0.99 0.94     Farrel Demark, RDMS, RVT  06/15/2013, 10:46 AM

## 2013-06-25 DIAGNOSIS — I639 Cerebral infarction, unspecified: Secondary | ICD-10-CM

## 2013-06-25 HISTORY — DX: Cerebral infarction, unspecified: I63.9

## 2013-06-27 ENCOUNTER — Inpatient Hospital Stay (HOSPITAL_COMMUNITY): Payer: Medicare Other

## 2013-06-27 ENCOUNTER — Emergency Department (HOSPITAL_COMMUNITY): Payer: Medicare Other

## 2013-06-27 ENCOUNTER — Inpatient Hospital Stay (HOSPITAL_COMMUNITY)
Admission: EM | Admit: 2013-06-27 | Discharge: 2013-06-29 | DRG: 066 | Disposition: A | Discharge status: Home / Self Care | Payer: Medicare Other | Attending: Internal Medicine | Admitting: Internal Medicine

## 2013-06-27 ENCOUNTER — Encounter (HOSPITAL_COMMUNITY): Payer: Self-pay | Admitting: Emergency Medicine

## 2013-06-27 DIAGNOSIS — Z7982 Long term (current) use of aspirin: Secondary | ICD-10-CM

## 2013-06-27 DIAGNOSIS — E1159 Type 2 diabetes mellitus with other circulatory complications: Secondary | ICD-10-CM | POA: Diagnosis present

## 2013-06-27 DIAGNOSIS — F17210 Nicotine dependence, cigarettes, uncomplicated: Secondary | ICD-10-CM | POA: Diagnosis present

## 2013-06-27 DIAGNOSIS — E1149 Type 2 diabetes mellitus with other diabetic neurological complication: Secondary | ICD-10-CM | POA: Diagnosis present

## 2013-06-27 DIAGNOSIS — E1169 Type 2 diabetes mellitus with other specified complication: Secondary | ICD-10-CM | POA: Diagnosis present

## 2013-06-27 DIAGNOSIS — Z8673 Personal history of transient ischemic attack (TIA), and cerebral infarction without residual deficits: Secondary | ICD-10-CM | POA: Diagnosis present

## 2013-06-27 DIAGNOSIS — E119 Type 2 diabetes mellitus without complications: Secondary | ICD-10-CM

## 2013-06-27 DIAGNOSIS — I63239 Cerebral infarction due to unspecified occlusion or stenosis of unspecified carotid arteries: Principal | ICD-10-CM

## 2013-06-27 DIAGNOSIS — I639 Cerebral infarction, unspecified: Secondary | ICD-10-CM

## 2013-06-27 DIAGNOSIS — D72829 Elevated white blood cell count, unspecified: Secondary | ICD-10-CM

## 2013-06-27 DIAGNOSIS — R4701 Aphasia: Secondary | ICD-10-CM | POA: Diagnosis present

## 2013-06-27 DIAGNOSIS — F17200 Nicotine dependence, unspecified, uncomplicated: Secondary | ICD-10-CM

## 2013-06-27 DIAGNOSIS — I6350 Cerebral infarction due to unspecified occlusion or stenosis of unspecified cerebral artery: Secondary | ICD-10-CM

## 2013-06-27 DIAGNOSIS — I10 Essential (primary) hypertension: Secondary | ICD-10-CM

## 2013-06-27 DIAGNOSIS — E1142 Type 2 diabetes mellitus with diabetic polyneuropathy: Secondary | ICD-10-CM | POA: Diagnosis present

## 2013-06-27 DIAGNOSIS — E785 Hyperlipidemia, unspecified: Secondary | ICD-10-CM

## 2013-06-27 DIAGNOSIS — G459 Transient cerebral ischemic attack, unspecified: Secondary | ICD-10-CM

## 2013-06-27 LAB — GLUCOSE, CAPILLARY: Glucose-Capillary: 156 mg/dL — ABNORMAL HIGH (ref 70–99)

## 2013-06-27 LAB — CBC WITH DIFFERENTIAL/PLATELET
Basophils Absolute: 0.1 10*3/uL (ref 0.0–0.1)
Basophils Relative: 0 % (ref 0–1)
Eosinophils Absolute: 0.2 10*3/uL (ref 0.0–0.7)
Eosinophils Relative: 2 % (ref 0–5)
Lymphs Abs: 2.5 10*3/uL (ref 0.7–4.0)
MCH: 30.9 pg (ref 26.0–34.0)
MCHC: 34.6 g/dL (ref 30.0–36.0)
MCV: 89.3 fL (ref 78.0–100.0)
Neutrophils Relative %: 72 % (ref 43–77)
Platelets: 366 10*3/uL (ref 150–400)
RDW: 15 % (ref 11.5–15.5)

## 2013-06-27 LAB — POCT I-STAT, CHEM 8
Creatinine, Ser: 0.9 mg/dL (ref 0.50–1.10)
HCT: 44 % (ref 36.0–46.0)
Hemoglobin: 15 g/dL (ref 12.0–15.0)
Potassium: 3.7 mEq/L (ref 3.5–5.1)
Sodium: 138 mEq/L (ref 135–145)
TCO2: 26 mmol/L (ref 0–100)

## 2013-06-27 LAB — CREATININE, SERUM
Creatinine, Ser: 0.73 mg/dL (ref 0.50–1.10)
GFR calc Af Amer: >90 mL/min (ref >90)
GFR calc non Af Amer: 83 mL/min — ABNORMAL LOW (ref >90)

## 2013-06-27 MED ORDER — VERAPAMIL HCL ER 120 MG PO TBCR
120.0000 mg | EXTENDED_RELEASE_TABLET | Freq: Every day | ORAL | Status: DC
  Administered 2013-06-27 – 2013-06-29 (×3): 120 mg via ORAL
  Filled 2013-06-27 (×3): qty 1

## 2013-06-27 MED ORDER — SENNOSIDES-DOCUSATE SODIUM 8.6-50 MG PO TABS: 1.0000 | ORAL_TABLET | Freq: Every evening | ORAL | Status: DC | PRN

## 2013-06-27 MED ORDER — ASPIRIN EC 81 MG PO TBEC
81.0000 mg | DELAYED_RELEASE_TABLET | Freq: Every day | ORAL | Status: DC
  Administered 2013-06-27 – 2013-06-29 (×3): 81 mg via ORAL
  Filled 2013-06-27 (×3): qty 1

## 2013-06-27 MED ORDER — ENOXAPARIN SODIUM 40 MG/0.4ML ~~LOC~~ SOLN
40.0000 mg | SUBCUTANEOUS | Status: DC
  Administered 2013-06-27 – 2013-06-28 (×2): 40 mg via SUBCUTANEOUS
  Filled 2013-06-27 (×3): qty 0.4

## 2013-06-27 MED ORDER — SODIUM CHLORIDE 0.9 % IV SOLN
INTRAVENOUS | Status: DC
  Administered 2013-06-27: 18:00:00 via INTRAVENOUS

## 2013-06-27 MED ORDER — ATORVASTATIN CALCIUM 40 MG PO TABS
40.0000 mg | ORAL_TABLET | Freq: Every day | ORAL | Status: DC
  Administered 2013-06-27: 40 mg via ORAL
  Filled 2013-06-27 (×3): qty 1

## 2013-06-27 MED ORDER — INSULIN ASPART 100 UNIT/ML ~~LOC~~ SOLN
0.0000 [IU] | Freq: Three times a day (TID) | SUBCUTANEOUS | Status: DC
  Administered 2013-06-27 – 2013-06-28 (×2): 2 [IU] via SUBCUTANEOUS
  Administered 2013-06-28: 3 [IU] via SUBCUTANEOUS
  Administered 2013-06-28: 2 [IU] via SUBCUTANEOUS
  Administered 2013-06-29: 3 [IU] via SUBCUTANEOUS
  Administered 2013-06-29: 2 [IU] via SUBCUTANEOUS

## 2013-06-27 MED ORDER — STROKE: EARLY STAGES OF RECOVERY BOOK
Freq: Once | Status: AC
  Administered 2013-06-27: 23:00:00
  Filled 2013-06-27: qty 1

## 2013-06-27 MED ORDER — ASPIRIN 81 MG PO TABS: 81.0000 mg | ORAL_TABLET | Freq: Every day | ORAL | Status: DC

## 2013-06-27 NOTE — H&P (Signed)
[facility] History and Physical  [name] ZOX:[id number] DOB: 11/25/1940 DOA: 06/27/2013  Referring physician: ED Physician.  PCP: Carollee Herter, MD   Chief Complaint: slurred speech.   HPI: [name] is a 72 y.o. female with PMH significant for hypertension, Diabetes, Dyslipidemia, TIA, current smoker who presents to the ED with persistent slurred speech and difficulty finding words. Patient wake up the morning of 12-12 with slurred speech, stuttering words, difficulty finding words. Patient ran out of aspirin 1 week ago. She continue to smoke one pack daily. On my interview she continue to have slurred speech, expressive aphasia. Per family patient speech is better today.  Patient denies chest pain, dyspnea, abdominal pain.  Per daughter patient has been in a lot stress.   Review of Systems:  Negative except as per HPI.   Past Medical History  Diagnosis Date  . Hypertension   . Diabetes mellitus   . Neuropathy, diabetic   . Dyslipidemia   . Smoker    Past Surgical History  Procedure Laterality Date  . Cholecystectomy     Social History:  reports that she has been smoking.  She does not have any smokeless tobacco history on file. She reports that she does not drink alcohol or use illicit drugs.  Allergies  Allergen Reactions  . Codeine Nausea And Vomiting    Family History: mother with history of Diabetes,, brother died sudden death.   Prior to Admission medications   Medication Sig Start Date End Date Taking? Authorizing Provider  aspirin 81 MG tablet Take 81 mg by mouth daily.   Yes Historical Provider, MD  lisinopril-hydrochlorothiazide (PRINZIDE,ZESTORETIC) 20-12.5 MG per tablet Take 1 tablet by mouth daily.   Yes Historical Provider, MD  rosuvastatin (CRESTOR) 20 MG tablet Take 1 tablet (20 mg total) by mouth daily. 12/15/12  Yes Ronnald Nian, MD  sitaGLIPtin-metformin (JANUMET) 50-1000 MG per tablet Take 1 tablet by mouth 2 (two) times daily  with a meal. 06/09/13  Yes Ronnald Nian, MD  verapamil (CALAN-SR) 240 MG CR tablet Take 240 mg by mouth daily.   Yes Historical Provider, MD   Physical Exam: Filed Vitals:   06/27/13 1330  BP: 125/57  Pulse: 74  Temp:   Resp:     BP 125/57  Pulse 74  Temp(Src) 98.1 F (36.7 C) (Oral)  Resp 22  Ht 5\' 6"  (1.676 m)  Wt 67.132 kg (148 lb)  BMI 23.90 kg/m2  SpO2 97%  BP 125/57  Pulse 74  Temp(Src) 98.1 F (36.7 C) (Oral)  Resp 22  Ht 5\' 6"  (1.676 m)  Wt 67.132 kg (148 lb)  BMI 23.90 kg/m2  SpO2 97%  General Appearance:    Alert, cooperative, no distress, appears stated age  Head:    Normocephalic, without obvious abnormality, atraumatic  Eyes:    PERRL, conjunctiva/corneas clear, EOM's intact,     Ears:    Normal TM's and external ear canals, both ears  Nose:   Nares normal, septum midline, mucosa normal, no drainage    or sinus tenderness  Throat:   Lips, mucosa, and tongue normal; teeth and gums normal  Neck:   Supple, symmetrical, trachea midline, no adenopathy;    thyroid:  no enlargement/tenderness/nodules; no carotid   bruit or JVD     Lungs:     Clear to auscultation bilaterally, respirations unlabored      Heart:    Regular rate and rhythm, S1 and S2 normal, no murmur, rub  or gallop     Abdomen:     Soft, non-tender, bowel sounds active all four quadrants,    no masses, no organomegaly        Extremities:   Extremities normal, atraumatic, no cyanosis or edema  Pulses:   2+ and symmetric all extremities  Skin:   Skin color, texture, turgor normal, no rashes or lesions  Lymph nodes:   Cervical, supraclavicular, and axillary nodes normal  Neurologic:   Slurred speech, difficulty finding words, following commands , normal strength, sensation and reflexes    throughout             Labs on Admission:  Basic Metabolic Panel:  Recent Labs Lab 06/27/13 0916  NA 138  K 3.7  CL 102  GLUCOSE 187*  BUN 18  CREATININE 0.90   Liver Function Tests: No  results found for this basename: AST, ALT, ALKPHOS, BILITOT, PROT, ALBUMIN,  in the last 168 hours No results found for this basename: LIPASE, AMYLASE,  in the last 168 hours No results found for this basename: AMMONIA,  in the last 168 hours CBC:  Recent Labs Lab 06/27/13 0900 06/27/13 0916  WBC 12.9*  --   NEUTROABS 9.4*  --   HGB 14.1 15.0  HCT 40.7 44.0  MCV 89.3  --   PLT 366  --    Cardiac Enzymes: No results found for this basename: CKTOTAL, CKMB, CKMBINDEX, TROPONINI,  in the last 168 hours  BNP (last 3 results) No results found for this basename: PROBNP,  in the last 8760 hours CBG:  Recent Labs Lab 06/27/13 0859  GLUCAP 176*    Radiological Exams on Admission: Ct Head Wo Contrast  06/27/2013   CLINICAL DATA:  Right arm paresthesias, speech difficulty, hypertension, diabetes  EXAM: CT HEAD WITHOUT CONTRAST  TECHNIQUE: Contiguous axial images were obtained from the base of the skull through the vertex without contrast.  COMPARISON:  11/16/2012  FINDINGS: Normal appearance of the intracranial structures. No evidence for acute hemorrhage, mass lesion, midline shift, hydrocephalus or large infarct. No acute bony abnormality. Minor right maxillary mucosal thickening. Other sinuses clear. Mastoids clear. Atherosclerosis of the intracranial vessels. .  IMPRESSION: No acute intracranial abnormality.   Electronically Signed   By: Ruel Favors M.D.   On: 06/27/2013 10:15   Mr [name] Head Wo Contrast  06/27/2013   CLINICAL DATA:  Slurred speech.  Stroke.  EXAM: MRI HEAD WITHOUT CONTRAST  MRA HEAD WITHOUT CONTRAST  TECHNIQUE: Multiplanar, multiecho pulse sequences of the brain and surrounding structures were obtained without intravenous contrast. Angiographic images of the head were obtained using MRA technique without contrast.  COMPARISON:  CT 06/27/2013  FINDINGS: MRI HEAD FINDINGS  Acute infarct left posterior frontal cortex extending into the white matter. No other acute infarct.   Mild chronic microvascular ischemic change in the white matter. Small chronic infarct posterior external capsule on the right. Brainstem and pons are normal.  Negative for intracranial hemorrhage.  Negative for mass or edema.  Mucosal edema right maxillary sinus.  MRA HEAD FINDINGS  Both vertebral arteries are patent to the basilar. The basilar is patent. Superior cerebellar and posterior cerebral arteries are patent bilaterally.  Cavernous carotid is patent bilaterally. Anterior and middle cerebral arteries are widely patent without stenosis.  Negative for cerebral aneurysm.  IMPRESSION: Acute infarct left posterior frontal lobe involving cortex and white matter. This measures approximately 10 x 20 mm.  Negative intracranial MRA.   Electronically Signed   By:  Marlan Palau M.D.   On: 06/27/2013 13:17   Mr Brain Wo Contrast  06/27/2013   CLINICAL DATA:  Slurred speech.  Stroke.  EXAM: MRI HEAD WITHOUT CONTRAST  MRA HEAD WITHOUT CONTRAST  TECHNIQUE: Multiplanar, multiecho pulse sequences of the brain and surrounding structures were obtained without intravenous contrast. Angiographic images of the head were obtained using MRA technique without contrast.  COMPARISON:  CT 06/27/2013  FINDINGS: MRI HEAD FINDINGS  Acute infarct left posterior frontal cortex extending into the white matter. No other acute infarct.  Mild chronic microvascular ischemic change in the white matter. Small chronic infarct posterior external capsule on the right. Brainstem and pons are normal.  Negative for intracranial hemorrhage.  Negative for mass or edema.  Mucosal edema right maxillary sinus.  MRA HEAD FINDINGS  Both vertebral arteries are patent to the basilar. The basilar is patent. Superior cerebellar and posterior cerebral arteries are patent bilaterally.  Cavernous carotid is patent bilaterally. Anterior and middle cerebral arteries are widely patent without stenosis.  Negative for cerebral aneurysm.  IMPRESSION: Acute infarct left  posterior frontal lobe involving cortex and white matter. This measures approximately 10 x 20 mm.  Negative intracranial MRA.   Electronically Signed   By: Marlan Palau M.D.   On: 06/27/2013 13:17    EKG: Independently reviewed. Sinus rhythm.   Assessment/Plan Active Problems:   HTN (hypertension)   Type II or unspecified type diabetes mellitus without mention of complication, not stated as uncontrolled   Dyslipidemia   Smoker   Stroke   1-Acute Stroke: MRI show Acute infarct left posterior frontal lobe involving cortex and white matter. Patient was not taking aspirin for 1 week, she is current smoker. She presents beyond period window for TPA. Will continue with aspirin. Order  lipid panel. ECHO and doppler. Needs risk factor modification.  2-HTN: Permissive hypertension in setting of acute stroke. Will continue with lower dose Cardizem to avoid rebound tachycardia. Hold diuretic SBP in the 120 range.     3-Diabetes: recent Hb A 1 at 7. Hold oral hypoglycemic medications while in the hospital. Will order SSI.   Code Status: presume Full Code.  Family Communication: Care discussed with Daughter who was at bedside.  Disposition Plan: expect 2 days inpatient.   Time spent: 75 minutes.   [facility] Pager [phone]

## 2013-06-27 NOTE — ED Provider Notes (Signed)
CSN: [id number]     Arrival date & time 06/27/13  4098 History   First MD Initiated Contact with Patient 06/27/13 [phone]     Chief Complaint  Patient presents with  . Stroke Symptoms   (Consider location/radiation/quality/duration/timing/severity/associated sxs/prior Treatment) Patient is a 72 y.o. female presenting with Acute Neurological Problem. The history is provided by the patient. No language interpreter was used.  Cerebrovascular Accident Episode onset: 24 hours ago. Pertinent negatives include no chest pain, chills, fever, headaches, nausea, numbness, rash, sore throat, vertigo, visual change, vomiting or weakness.  Pt is a 72 year old female who presents to the ER this morning after having slurred speech yesterday morning (24 hrs ago). She also had a brief period of time on the phone last night with her daughter where she had periods of repetitive speech saying, "ate a bite" over and over repeatedly. She is ambulatory this morning and reports that she is feeling fine. She wants to go shopping. She reports that she has not had any unilateral weakness, headache, problems with her vision or other related symptoms. She denies dizziness, weakness, numbness or difficulty walking. She denies fever, chills or recent illness. She reports that she is still about a 1 ppd smoker.   Past Medical History  Diagnosis Date  . Hypertension   . Diabetes mellitus   . Neuropathy, diabetic   . Dyslipidemia   . Smoker    Past Surgical History  Procedure Laterality Date  . Cholecystectomy     History reviewed. No pertinent family history. History  Substance Use Topics  . Smoking status: Current Every Day Smoker  . Smokeless tobacco: Not on file  . Alcohol Use: No   OB History   Grav Para Term Preterm Abortions TAB SAB Ect Mult Living                 Review of Systems  Constitutional: Negative for fever and chills.  HENT: Negative for sore throat.   Cardiovascular: Negative for chest pain.   Gastrointestinal: Negative for nausea and vomiting.  Musculoskeletal: Negative for gait problem.  Skin: Negative for rash.  Neurological: Positive for speech difficulty. Negative for dizziness, vertigo, facial asymmetry, weakness, numbness and headaches.  All other systems reviewed and are negative.    Allergies  Codeine  Home Medications   Current Outpatient Rx  Name  Route  Sig  Dispense  Refill  . lisinopril-hydrochlorothiazide (PRINZIDE,ZESTORETIC) 20-12.5 MG per tablet   Oral   Take 1 tablet by mouth daily.         . rosuvastatin (CRESTOR) 20 MG tablet   Oral   Take 1 tablet (20 mg total) by mouth daily.   30 tablet   11   . sitaGLIPtin-metformin (JANUMET) 50-1000 MG per tablet   Oral   Take 1 tablet by mouth 2 (two) times daily with a meal.   60 tablet   5   . verapamil (CALAN-SR) 240 MG CR tablet   Oral   Take 240 mg by mouth daily.         Marland Kitchen aspirin EC 325 MG EC tablet   Oral   Take 1 tablet (325 mg total) by mouth daily.   30 tablet   0    BP 142/50  Pulse 67  Temp(Src) 97.8 F (36.6 C) (Oral)  Resp 18  Ht 5\' 6"  (1.676 m)  Wt 148 lb (67.132 kg)  BMI 23.90 kg/m2  SpO2 98% Physical Exam  Nursing note and vitals reviewed. Constitutional:  She is oriented to person, place, and time. She appears well-developed and well-nourished. No distress.  HENT:  Head: Normocephalic and atraumatic.  Eyes: Conjunctivae and EOM are normal.  Neck: Normal range of motion. Neck supple. No JVD present. No tracheal deviation present. No thyromegaly present.  Cardiovascular: Normal rate, regular rhythm and normal heart sounds.   Pulmonary/Chest: Effort normal and breath sounds normal. No respiratory distress. She has no wheezes.  Abdominal: Soft. Bowel sounds are normal. She exhibits no distension. There is no tenderness. There is no rebound and no guarding.  Musculoskeletal: Normal range of motion.  Lymphadenopathy:    She has no cervical adenopathy.   Neurological: She is alert and oriented to person, place, and time. She has normal strength. No cranial nerve deficit or sensory deficit. Coordination and gait normal. GCS eye subscore is 4. GCS verbal subscore is 5. GCS motor subscore is 6.  Strength equal bilaterally. Resolved expressive aphasia and slurred speech.   Skin: Skin is warm and dry.  Psychiatric: She has a normal mood and affect. Her behavior is normal. Judgment and thought content normal.    ED Course  Procedures (including critical care time) Labs Review Labs Reviewed  CBC WITH DIFFERENTIAL - Abnormal; Notable for the following:    WBC 12.9 (*)    Neutro Abs 9.4 (*)    All other components within normal limits  GLUCOSE, CAPILLARY - Abnormal; Notable for the following:    Glucose-Capillary 176 (*)    All other components within normal limits  CREATININE, SERUM - Abnormal; Notable for the following:    GFR calc non Af Amer 83 (*)    All other components within normal limits  LIPID PANEL - Abnormal; Notable for the following:    Triglycerides 208 (*)    VLDL 42 (*)    All other components within normal limits  GLUCOSE, CAPILLARY - Abnormal; Notable for the following:    Glucose-Capillary 195 (*)    All other components within normal limits  GLUCOSE, CAPILLARY - Abnormal; Notable for the following:    Glucose-Capillary 156 (*)    All other components within normal limits  GLUCOSE, CAPILLARY - Abnormal; Notable for the following:    Glucose-Capillary 202 (*)    All other components within normal limits  URINALYSIS, ROUTINE W REFLEX MICROSCOPIC - Abnormal; Notable for the following:    Glucose, UA >1000 (*)    All other components within normal limits  GLUCOSE, CAPILLARY - Abnormal; Notable for the following:    Glucose-Capillary 161 (*)    All other components within normal limits  GLUCOSE, CAPILLARY - Abnormal; Notable for the following:    Glucose-Capillary 195 (*)    All other components within normal limits   CBC - Abnormal; Notable for the following:    WBC 11.8 (*)    All other components within normal limits  BASIC METABOLIC PANEL - Abnormal; Notable for the following:    Glucose, Bld 187 (*)    GFR calc non Af Amer 85 (*)    All other components within normal limits  GLUCOSE, CAPILLARY - Abnormal; Notable for the following:    Glucose-Capillary 214 (*)    All other components within normal limits  GLUCOSE, CAPILLARY - Abnormal; Notable for the following:    Glucose-Capillary 192 (*)    All other components within normal limits  GLUCOSE, CAPILLARY - Abnormal; Notable for the following:    Glucose-Capillary 225 (*)    All other components within normal limits  POCT I-STAT, CHEM 8 - Abnormal; Notable for the following:    Glucose, Bld 187 (*)    All other components within normal limits  URINE MICROSCOPIC-ADD ON   Imaging Review No results found.  EKG Interpretation    Date/Time:  Saturday June 27 2013 08:50:39 EST Ventricular Rate:  81 PR Interval:  170 QRS Duration: 138 QT Interval:  413 QTC Calculation: 479 R Axis:   64 Text Interpretation:  Sinus rhythm Right bundle branch block Sinus rhythm Right bundle branch block No significant change since last tracing Abnormal ekg Confirmed by Gerhard Munch  MD ([id number]) on 06/27/2013 9:51:13 AM            MDM   1. Stroke   2. Dyslipidemia   3. HTN (hypertension)   4. Type II or unspecified type diabetes mellitus without mention of complication, not stated as uncontrolled   5. Smoker   6. Occlusion and stenosis of carotid artery with cerebral infarction   7. Type II or unspecified type diabetes mellitus with neurological manifestations, not stated as uncontrolled(250.60)   8. TIA (transient ischemic attack)   9. Leukocytosis    Consulted with [facility] and admitted for stroke. Onset of symptoms were 24+ hours ago and she presented with history of slurred speech and expressive aphasia. Resolution of symptoms  at this time and Neuro exam in normal, cranial nerves II-XII intact. Discussed plan with pt to admit and she is agreeable. VS stable. Reassuring exam.       Irish Elders, NP 06/29/13 2203

## 2013-06-27 NOTE — ED Notes (Signed)
Patient presents to ED from home with complaints of slurred speech and unable to get the words out, and stuttering since yesterday.

## 2013-06-27 NOTE — Progress Notes (Signed)
ED called to report off on pt at 1540 and pt got on the unit at 1620. Pt arrived on unit via stretcher with RN on telemetry; upon arrival to unit pt able to ambulate from stretcher to bed with no difficulty voiced or assessed. Pt placed on telemetry; Pt  A&O x4, denies any pain, MAE x 4; denies any numbness or tingling; NIH 0; family at bed side and will continue to monitor.

## 2013-06-27 NOTE — Consult Note (Signed)
Referring Physician: Dr. Jeraldine Loots    Chief Complaint: New-onset speech difficulty  HPI: [name] is an 72 y.o. female history hypertension, hyperlipidemia, diabetes mellitus and TIA in May 2014, presenting with new onset speech difficulty which was first noted when she woke up yesterday morning. He was last known well at 9:30 PM on 06/25/2013. She's been taking aspirin daily but ran out of aspirin one week ago. CT scan of her head today showed no intracranial abnormality. MRI study showed small left posterior frontal ischemic cerebral infarction. NIH stroke score was 1.  LSN: 9:30 PM on 06/25/2013 tPA Given: No: Beyond time under for treatment consideration MRankin: 1  Past Medical History  Diagnosis Date  . Hypertension   . Diabetes mellitus   . Neuropathy, diabetic   . Dyslipidemia   . Smoker     History reviewed. No pertinent family history.   Medications: I have reviewed the patient's current medications.  ROS: History obtained from child and the patient  General ROS: negative for - chills, fatigue, fever, night sweats, weight gain or weight loss Psychological ROS: negative for - behavioral disorder, hallucinations, memory difficulties, mood swings or suicidal ideation Ophthalmic ROS: negative for - blurry vision, double vision, eye pain or loss of vision ENT ROS: negative for - epistaxis, nasal discharge, oral lesions, sore throat, tinnitus or vertigo Allergy and Immunology ROS: negative for - hives or itchy/watery eyes Hematological and Lymphatic ROS: negative for - bleeding problems, bruising or swollen lymph nodes Endocrine ROS: negative for - galactorrhea, hair pattern changes, polydipsia/polyuria or temperature intolerance Respiratory ROS: negative for - cough, hemoptysis, shortness of breath or wheezing Cardiovascular ROS: negative for - chest pain, dyspnea on exertion, edema or irregular heartbeat Gastrointestinal ROS: negative for - abdominal pain, diarrhea,  hematemesis, nausea/vomiting or stool incontinence Genito-Urinary ROS: negative for - dysuria, hematuria, incontinence or urinary frequency/urgency Musculoskeletal ROS: negative for - joint swelling or muscular weakness Neurological ROS: as noted in HPI Dermatological ROS: negative for rash and skin lesion changes  Physical Examination: Blood pressure 130/70, pulse 88, temperature 98.1 F (36.7 C), temperature source Oral, resp. rate 22, height 5\' 6"  (1.676 m), weight 67.132 kg (148 lb), SpO2 97.00%.  Neurologic Examination: Mental Status: Alert, oriented, thought content appropriate.  Speech speech was fluent for the most part with occasional word finding difficulty as well as occasional paraphasic error. Able to follow commands without difficulty. Cranial Nerves: II-Visual fields were normal. III/IV/VI-Pupils were equal and reacted. Extraocular movements were full and conjugate.    V/VII-no facial numbness and no facial weakness. VIII-normal. X-normal speech and symmetrical palatal movement. Motor: 5/5 bilaterally with normal tone and bulk Sensory: Normal throughout. Deep Tendon Reflexes: trace to 1+  and symmetric. Plantars: Flexor bilaterally Cerebellar: Normal finger-to-nose testing. Carotid auscultation: Normal  Ct Head Wo Contrast  06/27/2013   CLINICAL DATA:  Right arm paresthesias, speech difficulty, hypertension, diabetes  EXAM: CT HEAD WITHOUT CONTRAST  TECHNIQUE: Contiguous axial images were obtained from the base of the skull through the vertex without contrast.  COMPARISON:  11/16/2012  FINDINGS: Normal appearance of the intracranial structures. No evidence for acute hemorrhage, mass lesion, midline shift, hydrocephalus or large infarct. No acute bony abnormality. Minor right maxillary mucosal thickening. Other sinuses clear. Mastoids clear. Atherosclerosis of the intracranial vessels. .  IMPRESSION: No acute intracranial abnormality.   Electronically Signed   By: Ruel Favors M.D.   On: 06/27/2013 10:15   Mr [name] Head Wo Contrast  06/27/2013   CLINICAL DATA:  Slurred speech.  Stroke.  EXAM: MRI HEAD WITHOUT CONTRAST  MRA HEAD WITHOUT CONTRAST  TECHNIQUE: Multiplanar, multiecho pulse sequences of the brain and surrounding structures were obtained without intravenous contrast. Angiographic images of the head were obtained using MRA technique without contrast.  COMPARISON:  CT 06/27/2013  FINDINGS: MRI HEAD FINDINGS  Acute infarct left posterior frontal cortex extending into the white matter. No other acute infarct.  Mild chronic microvascular ischemic change in the white matter. Small chronic infarct posterior external capsule on the right. Brainstem and pons are normal.  Negative for intracranial hemorrhage.  Negative for mass or edema.  Mucosal edema right maxillary sinus.  MRA HEAD FINDINGS  Both vertebral arteries are patent to the basilar. The basilar is patent. Superior cerebellar and posterior cerebral arteries are patent bilaterally.  Cavernous carotid is patent bilaterally. Anterior and middle cerebral arteries are widely patent without stenosis.  Negative for cerebral aneurysm.  IMPRESSION: Acute infarct left posterior frontal lobe involving cortex and white matter. This measures approximately 10 x 20 mm.  Negative intracranial MRA.   Electronically Signed   By: Marlan Palau M.D.   On: 06/27/2013 13:17   Mr Brain Wo Contrast  06/27/2013   CLINICAL DATA:  Slurred speech.  Stroke.  EXAM: MRI HEAD WITHOUT CONTRAST  MRA HEAD WITHOUT CONTRAST  TECHNIQUE: Multiplanar, multiecho pulse sequences of the brain and surrounding structures were obtained without intravenous contrast. Angiographic images of the head were obtained using MRA technique without contrast.  COMPARISON:  CT 06/27/2013  FINDINGS: MRI HEAD FINDINGS  Acute infarct left posterior frontal cortex extending into the white matter. No other acute infarct.  Mild chronic microvascular ischemic change in the white  matter. Small chronic infarct posterior external capsule on the right. Brainstem and pons are normal.  Negative for intracranial hemorrhage.  Negative for mass or edema.  Mucosal edema right maxillary sinus.  MRA HEAD FINDINGS  Both vertebral arteries are patent to the basilar. The basilar is patent. Superior cerebellar and posterior cerebral arteries are patent bilaterally.  Cavernous carotid is patent bilaterally. Anterior and middle cerebral arteries are widely patent without stenosis.  Negative for cerebral aneurysm.  IMPRESSION: Acute infarct left posterior frontal lobe involving cortex and white matter. This measures approximately 10 x 20 mm.  Negative intracranial MRA.   Electronically Signed   By: Marlan Palau M.D.   On: 06/27/2013 13:17    Assessment: 73 y.o. female  with multiple risk factors for stroke presenting with acute mild expressive aphasia with small left posterior frontal ischemic infarction demonstrated on MRI study.   Stroke Risk Factors - diabetes mellitus, hyperlipidemia, hypertension and smoking  Plan: 1. HgbA1c, fasting lipid panel 2. MRI, MRA  of the brain without contrast 3. PT consult, OT consult, Speech consult 4. Echocardiogram 5. Carotid dopplers 6. Prophylactic therapy-Antiplatelet med: Aspirin  7. Risk factor modification 8. Telemetry monitoring   C.R. Roseanne Reno, MD [facility] [phone]   06/27/2013, 2:12 PM

## 2013-06-27 NOTE — ED Notes (Signed)
Patient transported to CT 

## 2013-06-28 DIAGNOSIS — I517 Cardiomegaly: Secondary | ICD-10-CM

## 2013-06-28 LAB — LIPID PANEL
Cholesterol: 156 mg/dL (ref 0–200)
Total CHOL/HDL Ratio: 3.3 RATIO

## 2013-06-28 LAB — URINALYSIS, ROUTINE W REFLEX MICROSCOPIC
Glucose, UA: >1000 mg/dL — AB
Hgb urine dipstick: NEGATIVE
Ketones, ur: NEGATIVE mg/dL
Leukocytes, UA: NEGATIVE
Nitrite: NEGATIVE
Protein, ur: NEGATIVE mg/dL
Specific Gravity, Urine: 1.029 (ref 1.005–1.030)
Urobilinogen, UA: 0.2 mg/dL (ref 0.0–1.0)

## 2013-06-28 LAB — GLUCOSE, CAPILLARY
Glucose-Capillary: 202 mg/dL — ABNORMAL HIGH (ref 70–99)
Glucose-Capillary: 161 mg/dL — ABNORMAL HIGH (ref 70–99)
Glucose-Capillary: 195 mg/dL — ABNORMAL HIGH (ref 70–99)
Glucose-Capillary: 214 mg/dL — ABNORMAL HIGH (ref 70–99)

## 2013-06-28 LAB — URINE MICROSCOPIC-ADD ON

## 2013-06-28 NOTE — Evaluation (Signed)
Occupational Therapy Evaluation Patient Details Name: [name] MRN: [id number] DOB: 1941-03-16 Today's Date: 06/28/2013 Time: 4098-1191 OT Time Calculation (min): 12 min  OT Assessment / Plan / Recommendation History of present illness Pt admitted with speech difficulty. MRI show Acute infarct left posterior frontal lobe involving cortex and white matter.    Clinical Impression   Pt admitted with above.  Pt is at baseline with ADLs and functional mobility. Continues to experience expressive deficits. Education completed. No further acute OT needs. Will sign off.    OT Assessment  Patient does not need any further OT services    Follow Up Recommendations  No OT follow up;Supervision/Assistance - 24 hour    Barriers to Discharge      Equipment Recommendations  None recommended by OT    Recommendations for Other Services Speech consult  Frequency       Precautions / Restrictions     Pertinent Vitals/Pain See vitals    ADL  Eating/Feeding: Performed;Independent Where Assessed - Eating/Feeding: Edge of bed Grooming: Performed;Wash/dry hands;Independent Where Assessed - Grooming: Unsupported standing Lower Body Dressing: Performed;Independent Where Assessed - Lower Body Dressing: Unsupported standing Toilet Transfer: Simulated;Independent Toilet Transfer Method: Sit to Barista:  (bed) Transfers/Ambulation Related to ADLs: independent ADL Comments: Pt independent with mobility within room. Able to pick objects up off floor while standing without LOB and retrieved ADL items independently without LOB. Family present and report pt is at baseline with ADLs and mobility.  Continues to demonstrate difficulty getting words out.  Reviewed signs/symptoms of stroke as well as risk factors.  Also recommended to pt and family that she have someone home with her 24/7 intially due to safety concern (in case of emergency and unable to communicate over phone for  help).     OT Diagnosis:    OT Problem List:   OT Treatment Interventions:     OT Goals(Current goals can be found in the care plan section)    Visit Information  Last OT Received On: 06/28/13 History of Present Illness: Pt admitted with speech difficulty. MRI show Acute infarct left posterior frontal lobe involving cortex and white matter.        Prior Functioning     Home Living Family/patient expects to be discharged to:: Private residence Living Arrangements: Spouse/significant other;Children Available Help at Discharge: Family;Available 24 hours/day Type of Home: House Home Access: Stairs to enter [employer] of Steps: 1 Entrance Stairs-Rails: None Home Layout: One level Home Equipment: Walker - 2 wheels Additional Comments: used walker in past when she hurt her knee Prior Function Level of Independence: Independent Communication Communication: Expressive difficulties Dominant Hand: Right         Vision/Perception     Cognition  Cognition Arousal/Alertness: Awake/alert Behavior During Therapy: WFL for tasks assessed/performed Overall Cognitive Status: Within Functional Limits for tasks assessed    Extremity/Trunk Assessment Upper Extremity Assessment Upper Extremity Assessment: Overall WFL for tasks assessed     Mobility Bed Mobility Bed Mobility: Not assessed (pt sitting EOB) Transfers Transfers: Sit to Stand;Stand to Sit Sit to Stand: 7: Independent;From bed Stand to Sit: 7: Independent;To bed     Exercise     Balance Balance Balance Assessed: Yes Static Sitting Balance Static Sitting - Balance Support: Feet supported Static Sitting - Level of Assistance: 7: Independent Dynamic Sitting Balance Dynamic Sitting - Balance Support: During functional activity Dynamic Sitting - Level of Assistance: 7: Independent Dynamic Sitting Balance - Compensations: independent donning socks while sitting  EOB Static Standing Balance Static  Standing - Balance Support: No upper extremity supported Static Standing - Level of Assistance: 7: Independent Dynamic Standing Balance Dynamic Standing - Balance Support: No upper extremity supported;During functional activity Dynamic Standing - Level of Assistance: 7: Independent Dynamic Standing - Balance Activities: Lateral lean/weight shifting;Forward lean/weight shifting;Reaching for objects;Reaching across midline Dynamic Standing - Comments: independent with picking items up off floor and out of low drawers.   End of Session OT - End of Session Activity Tolerance: Patient tolerated treatment well Patient left: in bed;with call bell/phone within reach;with family/visitor present  GO    06/28/2013 Cipriano Mile OTR/L Pager [phone] Office [phone]  Cipriano Mile 06/28/2013, 11:16 AM

## 2013-06-28 NOTE — Progress Notes (Signed)
Stroke Team Progress Note  HISTORY  [name] is a 72 y.o. female history hypertension, hyperlipidemia, diabetes mellitus and TIA in May 2014, presenting 06/27/2013 with new onset speech difficulty which was first noted when she woke up yesterday morning 06/26/2013. She was last known well at 9:30 PM on 06/25/2013. She's been taking aspirin daily but ran out of aspirin one week ago. CT scan of her head today showed no intracranial abnormality. MRI study showed small left posterior frontal ischemic cerebral infarction. NIH stroke score was 1.   LSN: 9:30 PM on 06/25/2013  tPA Given: No: Beyond time under for treatment consideration  MRankin: 1   SUBJECTIVE The patient's daughter and son-in-law are in the room this morning. The patient feels she is doing much better. She feels back to baseline other than some occasional word finding difficulties. She is anxious to go home. We discussed smoking cessation and medication compliance.  OBJECTIVE Most recent Vital Signs: Filed Vitals:   06/28/13 0035 06/28/13 0237 06/28/13 0443 06/28/13 0656  BP: 122/52 136/69 143/70 138/61  Pulse: 72 73 71 69  Temp: 97.8 F (36.6 C) 98.1 F (36.7 C) 97.8 F (36.6 C) 98 F (36.7 C)  TempSrc: Oral Oral Oral Oral  Resp: 18 18 18 20   Height:      Weight:      SpO2: 97% 95% 95% 98%   CBG (last 3)   Recent Labs  06/27/13 1806 06/27/13 2126 06/28/13 0653  GLUCAP 195* 156* 202*    IV Fluid Intake:   . sodium chloride 75 mL/hr at 06/27/13 1755    MEDICATIONS  . aspirin EC  81 mg Oral Daily  . atorvastatin  40 mg Oral q1800  . enoxaparin (LOVENOX) injection  40 mg Subcutaneous Q24H  . insulin aspart  0-9 Units Subcutaneous TID WC  . verapamil  120 mg Oral Daily   PRN:  senna-docusate  Diet:  Carb Control thin liquids Activity:  Up with assistance DVT Prophylaxis:  Lovenox  CLINICALLY SIGNIFICANT STUDIES Basic Metabolic Panel:   Recent Labs Lab 06/27/13 0900 06/27/13 0916  NA  --   138  K  --  3.7  CL  --  102  GLUCOSE  --  187*  BUN  --  18  CREATININE 0.73 0.90   Liver Function Tests: No results found for this basename: AST, ALT, ALKPHOS, BILITOT, PROT, ALBUMIN,  in the last 168 hours CBC:   Recent Labs Lab 06/27/13 0900 06/27/13 0916  WBC 12.9*  --   NEUTROABS 9.4*  --   HGB 14.1 15.0  HCT 40.7 44.0  MCV 89.3  --   PLT 366  --    Coagulation: No results found for this basename: LABPROT, INR,  in the last 168 hours Cardiac Enzymes: No results found for this basename: CKTOTAL, CKMB, CKMBINDEX, TROPONINI,  in the last 168 hours Urinalysis: No results found for this basename: COLORURINE, APPERANCEUR, LABSPEC, PHURINE, GLUCOSEU, HGBUR, BILIRUBINUR, KETONESUR, PROTEINUR, UROBILINOGEN, NITRITE, LEUKOCYTESUR,  in the last 168 hours Lipid Panel    Component Value Date/Time   CHOL 156 06/28/2013 0414   TRIG 208* 06/28/2013 0414   HDL 48 06/28/2013 0414   CHOLHDL 3.3 06/28/2013 0414   VLDL 42* 06/28/2013 0414   LDLCALC 66 06/28/2013 0414   HgbA1C  Lab Results  Component Value Date   HGBA1C 7.6 06/09/2013    Urine Drug Screen:   No results found for this basename: labopia,  cocainscrnur,  labbenz,  amphetmu,  thcu,  labbarb    Alcohol Level: No results found for this basename: ETH,  in the last 168 hours   Dg Chest 2 View 06/28/2013    No acute cardiopulmonary process seen.       Ct Head Wo Contrast 06/27/2013    No acute intracranial abnormality.    Mr [name] Head Wo Contrast 06/27/2013    Acute infarct left posterior frontal lobe involving cortex and white matter. This measures approximately 10 x 20 mm.  Negative intracranial MRA.      2D Echocardiogram  pending  Carotid Doppler  pending  EKG  sinus rhythm rate 81 beats per minute  Therapy Recommendations pending  Physical Exam    Neurologic Examination:  Mental Status:  Alert, oriented, thought content appropriate. Speech speech was fluent for the most part with occasional word  finding difficulty as well as occasional paraphasic error. Able to follow commands without difficulty.  Cranial Nerves:  II-Visual fields were normal.  III/IV/VI-Pupils were equal and reacted. Extraocular movements were full and conjugate.  V/VII-no facial numbness and no facial weakness.  VIII-normal.  X-normal speech and symmetrical palatal movement.  Motor: 5/5 bilaterally with normal tone and bulk  Sensory: Normal throughout.  Deep Tendon Reflexes: trace to 1+ and symmetric.  Plantars: Flexor bilaterally  Cerebellar: Normal finger-to-nose testing.  Carotid auscultation: Normal   ASSESSMENT [name] is a 72 y.o. female presenting with speech difficulties. TPA was not used as the patient was beyond the window for treatment and her deficits are minimal . An MRI of revealed an acute infarct left posterior frontal lobe involving cortex and white matter. Infarct felt to be thrombotic secondary to small vessel disease..  On aspirin 81 mg orally every day prior to admission. Now on aspirin 81 mg orally every day for secondary stroke prevention. Patient with resultant mild aphasia. Work up underway.   Hypertension history  Diabetes mellitus hemoglobin A1c 7.6  Dyslipidemia cholesterol 156 LDL 66. The patient was on Lipitor prior to admission.  Tobacco history  Hospital day # 1  TREATMENT/PLAN  Continue aspirin 81 mg daily. The patient had run out of her aspirin prior to admission and had received no aspirin for approximately 1 week.  Await 2-D echo and carotid Dopplers  Awake therapist evaluations  Risk factor modification - smoking cessation  Delton See PA-C [facility] Pager [phone] 06/28/2013, 9:07 AM  I evaluated and examined patient, reviewed records, labs and imaging, and agree with note and plan.  Suanne Marker, MD 06/28/2013, 5:30 PM Certified in Neurology, Neurophysiology and Neuroimaging [facility] - Stroke  Team  Please refer to [email or web address] for on-call Stroke MD

## 2013-06-28 NOTE — Progress Notes (Signed)
[facility] PROGRESS NOTE  [name] ZOX:[id number] DOB: Aug 03, 1940 DOA: 06/27/2013 PCP: Carollee Herter, MD  Assessment/Plan: Acute Stroke:  -MRI show Acute infarct left posterior frontal lobe involving cortex and white matter.  - not taking aspirin for 1 week -smoker/DM -presents beyond period window for TPA.  -aspirin -lipid panel: LDL 66 -ECHO  -doppler.  PT/OT/SLP  HTN: Permissive hypertension in setting of acute stroke. Will continue with lower dose Cardizem to avoid rebound tachycardia. Hold diuretic SBP in the 120 range.   Diabetes: recent HgbA1C at 7. Hold oral hypoglycemic medications while in the hospital. Will order SSI   Code Status: full Family Communication: patient/ daughter at bedside Disposition Plan:    Consultants:  neuro  Procedures:    Antibiotics:    HPI/Subjective: Anxious to go home  Objective: Filed Vitals:   06/28/13 0656  BP: 138/61  Pulse: 69  Temp: 98 F (36.7 C)  Resp: 20    Intake/Output Summary (Last 24 hours) at 06/28/13 0925 Last data filed at 06/27/13 1813  Gross per 24 hour  Intake      0 ml  Output      2 ml  Net     -2 ml   Filed Weights   06/27/13 0848  Weight: 67.132 kg (148 lb)    Exam:   General:  A+Ox3, NAD- mild word finding difficulties  Cardiovascular: rrr  Respiratory: clear anterior  Abdomen: +BS, soft  Musculoskeletal: moves all 4 ext, no edema   Data Reviewed: Basic Metabolic Panel:  Recent Labs Lab 06/27/13 0900 06/27/13 0916  NA  --  138  K  --  3.7  CL  --  102  GLUCOSE  --  187*  BUN  --  18  CREATININE 0.73 0.90   Liver Function Tests: No results found for this basename: AST, ALT, ALKPHOS, BILITOT, PROT, ALBUMIN,  in the last 168 hours No results found for this basename: LIPASE, AMYLASE,  in the last 168 hours No results found for this basename: AMMONIA,  in the last 168 hours CBC:  Recent Labs Lab 06/27/13 0900 06/27/13 0916  WBC 12.9*  --    NEUTROABS 9.4*  --   HGB 14.1 15.0  HCT 40.7 44.0  MCV 89.3  --   PLT 366  --    Cardiac Enzymes: No results found for this basename: CKTOTAL, CKMB, CKMBINDEX, TROPONINI,  in the last 168 hours BNP (last 3 results) No results found for this basename: PROBNP,  in the last 8760 hours CBG:  Recent Labs Lab 06/27/13 0859 06/27/13 1806 06/27/13 2126 06/28/13 0653  GLUCAP 176* 195* 156* 202*    No results found for this or any previous visit (from the past 240 hour(s)).   Studies: Dg Chest 2 View  06/28/2013   CLINICAL DATA:  CVA.  EXAM: CHEST  2 VIEW  COMPARISON:  None.  FINDINGS: The patient's right IJ line is noted ending about the proximal SVC.  The lungs are well-aerated and clear. There is no evidence of focal opacification, pleural effusion or pneumothorax.  The heart is normal in size; the mediastinal contour is within normal limits. No acute osseous abnormalities are seen. Clips are noted within the right upper quadrant, reflecting prior cholecystectomy.  IMPRESSION: No acute cardiopulmonary process seen.   Electronically Signed   By: Roanna Raider M.D.   On: 06/28/2013 05:36   Ct Head Wo Contrast  06/27/2013   CLINICAL DATA:  Right arm paresthesias, speech difficulty, hypertension,  diabetes  EXAM: CT HEAD WITHOUT CONTRAST  TECHNIQUE: Contiguous axial images were obtained from the base of the skull through the vertex without contrast.  COMPARISON:  11/16/2012  FINDINGS: Normal appearance of the intracranial structures. No evidence for acute hemorrhage, mass lesion, midline shift, hydrocephalus or large infarct. No acute bony abnormality. Minor right maxillary mucosal thickening. Other sinuses clear. Mastoids clear. Atherosclerosis of the intracranial vessels. .  IMPRESSION: No acute intracranial abnormality.   Electronically Signed   By: Ruel Favors M.D.   On: 06/27/2013 10:15   Mr [name] Head Wo Contrast  06/27/2013   CLINICAL DATA:  Slurred speech.  Stroke.  EXAM: MRI HEAD  WITHOUT CONTRAST  MRA HEAD WITHOUT CONTRAST  TECHNIQUE: Multiplanar, multiecho pulse sequences of the brain and surrounding structures were obtained without intravenous contrast. Angiographic images of the head were obtained using MRA technique without contrast.  COMPARISON:  CT 06/27/2013  FINDINGS: MRI HEAD FINDINGS  Acute infarct left posterior frontal cortex extending into the white matter. No other acute infarct.  Mild chronic microvascular ischemic change in the white matter. Small chronic infarct posterior external capsule on the right. Brainstem and pons are normal.  Negative for intracranial hemorrhage.  Negative for mass or edema.  Mucosal edema right maxillary sinus.  MRA HEAD FINDINGS  Both vertebral arteries are patent to the basilar. The basilar is patent. Superior cerebellar and posterior cerebral arteries are patent bilaterally.  Cavernous carotid is patent bilaterally. Anterior and middle cerebral arteries are widely patent without stenosis.  Negative for cerebral aneurysm.  IMPRESSION: Acute infarct left posterior frontal lobe involving cortex and white matter. This measures approximately 10 x 20 mm.  Negative intracranial MRA.   Electronically Signed   By: Marlan Palau M.D.   On: 06/27/2013 13:17   Mr Brain Wo Contrast  06/27/2013   CLINICAL DATA:  Slurred speech.  Stroke.  EXAM: MRI HEAD WITHOUT CONTRAST  MRA HEAD WITHOUT CONTRAST  TECHNIQUE: Multiplanar, multiecho pulse sequences of the brain and surrounding structures were obtained without intravenous contrast. Angiographic images of the head were obtained using MRA technique without contrast.  COMPARISON:  CT 06/27/2013  FINDINGS: MRI HEAD FINDINGS  Acute infarct left posterior frontal cortex extending into the white matter. No other acute infarct.  Mild chronic microvascular ischemic change in the white matter. Small chronic infarct posterior external capsule on the right. Brainstem and pons are normal.  Negative for intracranial  hemorrhage.  Negative for mass or edema.  Mucosal edema right maxillary sinus.  MRA HEAD FINDINGS  Both vertebral arteries are patent to the basilar. The basilar is patent. Superior cerebellar and posterior cerebral arteries are patent bilaterally.  Cavernous carotid is patent bilaterally. Anterior and middle cerebral arteries are widely patent without stenosis.  Negative for cerebral aneurysm.  IMPRESSION: Acute infarct left posterior frontal lobe involving cortex and white matter. This measures approximately 10 x 20 mm.  Negative intracranial MRA.   Electronically Signed   By: Marlan Palau M.D.   On: 06/27/2013 13:17    Scheduled Meds: . aspirin EC  81 mg Oral Daily  . atorvastatin  40 mg Oral q1800  . enoxaparin (LOVENOX) injection  40 mg Subcutaneous Q24H  . insulin aspart  0-9 Units Subcutaneous TID WC  . verapamil  120 mg Oral Daily   Continuous Infusions: . sodium chloride 75 mL/hr at 06/27/13 1755    Active Problems:   HTN (hypertension)   Type II or unspecified type diabetes mellitus without mention of complication,  not stated as uncontrolled   Dyslipidemia   Smoker   Stroke    Time spent: 3    [facility], [facility]  [facility] Pager [phone]. If 7PM-7AM, please contact night-coverage at [email or web address], password [facility] 06/28/2013, 9:25 AM  LOS: 1 day

## 2013-06-28 NOTE — Progress Notes (Signed)
  Echocardiogram 2D Echocardiogram has been performed.  Nestor Ramp M 06/28/2013, 12:38 PM

## 2013-06-28 NOTE — Progress Notes (Signed)
PT Cancellation Note  Patient Details Name: [name] MRN: [id number] DOB: [birth year]   Cancelled Treatment:    Reason Eval/Treat Not Completed: PT screened, no needs identified, will sign off  Discussed pt with OT, who reports there are no PT needs   Van Clines [facility] 06/28/2013, 11:17 AM

## 2013-06-29 ENCOUNTER — Encounter: Payer: Self-pay | Admitting: *Deleted

## 2013-06-29 ENCOUNTER — Telehealth: Payer: Self-pay | Admitting: Family Medicine

## 2013-06-29 DIAGNOSIS — I63239 Cerebral infarction due to unspecified occlusion or stenosis of unspecified carotid arteries: Principal | ICD-10-CM

## 2013-06-29 LAB — CBC
Hemoglobin: 12.5 g/dL (ref 12.0–15.0)
MCV: 89.1 fL (ref 78.0–100.0)
Platelets: 349 10*3/uL (ref 150–400)
RDW: 14.8 % (ref 11.5–15.5)
WBC: 11.8 10*3/uL — ABNORMAL HIGH (ref 4.0–10.5)

## 2013-06-29 LAB — BASIC METABOLIC PANEL
Calcium: 8.7 mg/dL (ref 8.4–10.5)
Chloride: 106 mEq/L (ref 96–112)
GFR calc Af Amer: >90 mL/min (ref >90)
Potassium: 3.9 mEq/L (ref 3.5–5.1)
Sodium: 142 mEq/L (ref 135–145)

## 2013-06-29 LAB — GLUCOSE, CAPILLARY: Glucose-Capillary: 225 mg/dL — ABNORMAL HIGH (ref 70–99)

## 2013-06-29 MED ORDER — ASPIRIN EC 325 MG PO TBEC: 325.0000 mg | DELAYED_RELEASE_TABLET | Freq: Every day | ORAL | Status: DC

## 2013-06-29 MED ORDER — ASPIRIN 325 MG PO TBEC: 325.0000 mg | DELAYED_RELEASE_TABLET | Freq: Every day | ORAL | Status: DC

## 2013-06-29 NOTE — Progress Notes (Signed)
[facility] PROGRESS NOTE  [name] WUJ:[id number] DOB: 02-Feb-1941 DOA: 06/27/2013 PCP: Carollee Herter, MD  Assessment/Plan: Acute Stroke:  -MRI show Acute infarct left posterior frontal lobe involving cortex and white matter.  - not taking aspirin for 1 week -smoker/DM -presents beyond period window for TPA.  -aspirin -lipid panel: LDL 66 -ECHO :Study Conclusions  - Left ventricle: The cavity size was normal. Systolic function was normal. The estimated ejection fraction was in the range of 55% to 60%. Wall motion was normal; there were no regional wall motion abnormalities. Features are consistent with a pseudonormal left ventricular filling pattern, with concomitant abnormal relaxation and increased filling pressure (grade 2 diastolic dysfunction). - Left atrium: The atrium was mildly dilated. Impressions:  - No cardiac source of emboli was indentified.   -doppler showed left side >80% stenosis- LM for Dr. Darrick Penna PT/OT/SLP  tobacco abuse- encourage cessation  HTN: Permissive hypertension in setting of acute stroke. Will continue with lower dose Cardizem to avoid rebound tachycardia. Hold diuretic SBP in the 120 range.   Diabetes: recent HgbA1C at 7. Hold oral hypoglycemic medications while in the hospital. Will order SSI   Code Status: full Family Communication: patient at bedside Disposition Plan:    Consultants:  Neuro  Vascular surgery  Procedures:    Antibiotics:    HPI/Subjective: Anxious to go home  Objective: Filed Vitals:   06/29/13 0525  BP: 153/66  Pulse: 71  Temp: 97.9 F (36.6 C)  Resp: 18    Intake/Output Summary (Last 24 hours) at 06/29/13 1006 Last data filed at 06/29/13 0900  Gross per 24 hour  Intake    240 ml  Output      0 ml  Net    240 ml   Filed Weights   06/27/13 0848  Weight: 67.132 kg (148 lb)    Exam:   General:  A+Ox3, NAD- mild word finding difficulties  Cardiovascular:  rrr  Respiratory: clear anterior  Abdomen: +BS, soft  Musculoskeletal: moves all 4 ext, no edema   Data Reviewed: Basic Metabolic Panel:  Recent Labs Lab 06/27/13 0900 06/27/13 0916 06/29/13 0635  NA  --  138 142  K  --  3.7 3.9  CL  --  102 106  CO2  --   --  24  GLUCOSE  --  187* 187*  BUN  --  18 16  CREATININE 0.73 0.90 0.68  CALCIUM  --   --  8.7   Liver Function Tests: No results found for this basename: AST, ALT, ALKPHOS, BILITOT, PROT, ALBUMIN,  in the last 168 hours No results found for this basename: LIPASE, AMYLASE,  in the last 168 hours No results found for this basename: AMMONIA,  in the last 168 hours CBC:  Recent Labs Lab 06/27/13 0900 06/27/13 0916 06/29/13 0635  WBC 12.9*  --  11.8*  NEUTROABS 9.4*  --   --   HGB 14.1 15.0 12.5  HCT 40.7 44.0 37.6  MCV 89.3  --  89.1  PLT 366  --  349   Cardiac Enzymes: No results found for this basename: CKTOTAL, CKMB, CKMBINDEX, TROPONINI,  in the last 168 hours BNP (last 3 results) No results found for this basename: PROBNP,  in the last 8760 hours CBG:  Recent Labs Lab 06/28/13 0653 06/28/13 1153 06/28/13 1643 06/28/13 2208 06/29/13 0642  GLUCAP 202* 161* 195* 214* 192*    No results found for this or any previous visit (from the past 240 hour(s)).  Studies: Dg Chest 2 View  06/28/2013   CLINICAL DATA:  CVA.  EXAM: CHEST  2 VIEW  COMPARISON:  None.  FINDINGS: The patient's right IJ line is noted ending about the proximal SVC.  The lungs are well-aerated and clear. There is no evidence of focal opacification, pleural effusion or pneumothorax.  The heart is normal in size; the mediastinal contour is within normal limits. No acute osseous abnormalities are seen. Clips are noted within the right upper quadrant, reflecting prior cholecystectomy.  IMPRESSION: No acute cardiopulmonary process seen.   Electronically Signed   By: Roanna Raider M.D.   On: 06/28/2013 05:36   Mr [name] Head Wo  Contrast  06/27/2013   CLINICAL DATA:  Slurred speech.  Stroke.  EXAM: MRI HEAD WITHOUT CONTRAST  MRA HEAD WITHOUT CONTRAST  TECHNIQUE: Multiplanar, multiecho pulse sequences of the brain and surrounding structures were obtained without intravenous contrast. Angiographic images of the head were obtained using MRA technique without contrast.  COMPARISON:  CT 06/27/2013  FINDINGS: MRI HEAD FINDINGS  Acute infarct left posterior frontal cortex extending into the white matter. No other acute infarct.  Mild chronic microvascular ischemic change in the white matter. Small chronic infarct posterior external capsule on the right. Brainstem and pons are normal.  Negative for intracranial hemorrhage.  Negative for mass or edema.  Mucosal edema right maxillary sinus.  MRA HEAD FINDINGS  Both vertebral arteries are patent to the basilar. The basilar is patent. Superior cerebellar and posterior cerebral arteries are patent bilaterally.  Cavernous carotid is patent bilaterally. Anterior and middle cerebral arteries are widely patent without stenosis.  Negative for cerebral aneurysm.  IMPRESSION: Acute infarct left posterior frontal lobe involving cortex and white matter. This measures approximately 10 x 20 mm.  Negative intracranial MRA.   Electronically Signed   By: Marlan Palau M.D.   On: 06/27/2013 13:17   Mr Brain Wo Contrast  06/27/2013   CLINICAL DATA:  Slurred speech.  Stroke.  EXAM: MRI HEAD WITHOUT CONTRAST  MRA HEAD WITHOUT CONTRAST  TECHNIQUE: Multiplanar, multiecho pulse sequences of the brain and surrounding structures were obtained without intravenous contrast. Angiographic images of the head were obtained using MRA technique without contrast.  COMPARISON:  CT 06/27/2013  FINDINGS: MRI HEAD FINDINGS  Acute infarct left posterior frontal cortex extending into the white matter. No other acute infarct.  Mild chronic microvascular ischemic change in the white matter. Small chronic infarct posterior external  capsule on the right. Brainstem and pons are normal.  Negative for intracranial hemorrhage.  Negative for mass or edema.  Mucosal edema right maxillary sinus.  MRA HEAD FINDINGS  Both vertebral arteries are patent to the basilar. The basilar is patent. Superior cerebellar and posterior cerebral arteries are patent bilaterally.  Cavernous carotid is patent bilaterally. Anterior and middle cerebral arteries are widely patent without stenosis.  Negative for cerebral aneurysm.  IMPRESSION: Acute infarct left posterior frontal lobe involving cortex and white matter. This measures approximately 10 x 20 mm.  Negative intracranial MRA.   Electronically Signed   By: Marlan Palau M.D.   On: 06/27/2013 13:17    Scheduled Meds: . aspirin EC  81 mg Oral Daily  . atorvastatin  40 mg Oral q1800  . enoxaparin (LOVENOX) injection  40 mg Subcutaneous Q24H  . insulin aspart  0-9 Units Subcutaneous TID WC  . verapamil  120 mg Oral Daily   Continuous Infusions: . sodium chloride 75 mL/hr at 06/27/13 1755    Active Problems:  HTN (hypertension)   Type II or unspecified type diabetes mellitus without mention of complication, not stated as uncontrolled   Dyslipidemia   Smoker   Stroke    Time spent: 68    Marlin Canary  [facility] Pager [phone]. If 7PM-7AM, please contact night-coverage at [email or web address], password [facility] 06/29/2013, 10:06 AM  LOS: 2 days

## 2013-06-29 NOTE — Evaluation (Signed)
Speech Language Pathology Evaluation Patient Details Name: [name] MRN: [id number] DOB: 1941-06-10 Today's Date: 06/29/2013 Time: 0815-0910 SLP Time Calculation (min): 55 min  Problem List:  Patient Active Problem List   Diagnosis Date Noted  . Stroke 06/27/2013  . Leukocytosis 11/17/2012  . TIA (transient ischemic attack) 11/16/2012  . HTN (hypertension) 01/04/2011  . Type II or unspecified type diabetes mellitus without mention of complication, not stated as uncontrolled 01/04/2011  . Dyslipidemia 01/04/2011  . Smoker 01/04/2011   Past Medical History:  Past Medical History  Diagnosis Date  . Hypertension   . Diabetes mellitus   . Neuropathy, diabetic   . Dyslipidemia   . Smoker    Past Surgical History:  Past Surgical History  Procedure Laterality Date  . Cholecystectomy     HPI:  72 yo female adm to [facility] with speech disturbance, found to have acute left posterior frontal lobe CVA.  PMH + for DM, smoker.  Speech evaluation ordered.  Pt resides with spouse, daughter [name] and granddaughter [name].  She is retired since age 23, has a 10th grade education and used to be a personal care attendant to elderly.     Assessment / Plan / Recommendation Clinical Impression  Pt presents with mild expressive language and motor planning issues resulting in dysfluent output.  Automatic speech tasks and social communication easier for pt to produce.  Pt's receptive language skills are intact for high level communication.  Written skills consistent with verbal skills with pt requiring extra time to write basic sentences.  Daughter reports handwriting is the same as prior to admit.  Pt able to name 9 animals in 60 seconds.  Question attention difficulties as pt distracted by occurences outside of her room during the session.  Pt would benefit from skilled SLP to maximize functional expressive communication.  Skilled intervention included educating pt and daughter to findings of test  results, compensation strategy utilization with demonstration and recommendation for outpt SLP.       SLP Assessment  Patient needs continued Speech Lanaguage Pathology Services    Follow Up Recommendations  Outpatient SLP    Frequency and Duration min 2x/week  2 weeks   Pertinent Vitals/Pain Afebrile, decreased   SLP Goals  SLP Goals Potential to Achieve Goals: Fair  SLP Evaluation Prior Functioning  Cognitive/Linguistic Baseline: Within functional limits Type of Home: House  Lives With: Spouse;Daughter Available Help at Discharge: Family;Available 24 hours/day Education: 10th grade Vocation: Retired   IT consultant  Overall Cognitive Status: Within Functional Limits for tasks assessed Arousal/Alertness: Awake/alert Orientation Level: Oriented X4 Attention: Selective Selective Attention: Impaired Selective Attention Impairment: Verbal complex (easily distracted by people walking by) Memory: Appears intact Awareness: Appears intact Problem Solving: Appears intact Executive Function: Reasoning;Decision Making Reasoning: Impaired Reasoning Impairment: Verbal complex Decision Making: Impaired Decision Making Impairment: Verbal complex (concern for pt managing medications independently- suspect baseline strong personality) Behaviors: Restless    Comprehension  Auditory Comprehension Overall Auditory Comprehension: Appears within functional limits for tasks assessed Yes/No Questions: Not tested Commands: Within Functional Limits Conversation: Complex Visual Recognition/Discrimination Discrimination: Within Function Limits Reading Comprehension Reading Status: Not tested    Expression Expression Primary Mode of Expression: Verbal Verbal Expression Overall Verbal Expression: Impaired Initiation: Impaired Automatic Speech: Social Response;Counting Level of Generative/Spontaneous Verbalization: Sentence;Conversation Repetition: No impairment Naming: Not  tested Pragmatics: No impairment Effective Techniques: Phonemic cues Non-Verbal Means of Communication: Not applicable Written Expression Dominant Hand: Right Written Expression:  (consistent with verbal language skills)   Oral /  Motor Oral Motor/Sensory Function Overall Oral Motor/Sensory Function: Appears within functional limits for tasks assessed Motor Speech Overall Motor Speech: Appears within functional limits for tasks assessed Motor Speech Errors: Aware;Groping for words   GO     Donavan Burnet, MS [facility] SLP [phone]

## 2013-06-29 NOTE — Progress Notes (Signed)
Stroke Team Progress Note  HISTORY [name] is a 72 y.o. female history hypertension, hyperlipidemia, diabetes mellitus and TIA in May 2014, presenting 06/27/2013 with new onset speech difficulty which was first noted when she woke up yesterday morning 06/26/2013. She was last known well at 9:30 PM on 06/25/2013. She's been taking aspirin daily but ran out of aspirin one week ago. CT scan of her head today showed no intracranial abnormality. MRI study showed small left posterior frontal ischemic cerebral infarction. NIH stroke score was 1. She was not a TPA candidate secondary to delay in arrival. She was admitted for further evaluation and treatment.   SUBJECTIVE Dr. Benjamine Mola is at the bedside. Her daughter is at the bedside. Pt lives with her husband (step father). Pt sitting up in the bed.  OBJECTIVE Most recent Vital Signs: Filed Vitals:   06/28/13 2210 06/29/13 0115 06/29/13 0525 06/29/13 1027  BP: 128/52 154/72 153/66 157/60  Pulse: 66 64 71 62  Temp: 97.8 F (36.6 C) 97.9 F (36.6 C) 97.9 F (36.6 C) 97.9 F (36.6 C)  TempSrc: Oral Oral Oral Oral  Resp: 18 18 18 18   Height:      Weight:      SpO2: 97% 100% 99% 98%   CBG (last 3)   Recent Labs  06/28/13 1643 06/28/13 2208 06/29/13 0642  GLUCAP 195* 214* 192*    IV Fluid Intake:   . sodium chloride 75 mL/hr at 06/27/13 1755    MEDICATIONS  . aspirin EC  81 mg Oral Daily  . atorvastatin  40 mg Oral q1800  . enoxaparin (LOVENOX) injection  40 mg Subcutaneous Q24H  . insulin aspart  0-9 Units Subcutaneous TID WC  . verapamil  120 mg Oral Daily   PRN:  senna-docusate  Diet:  Carb Control thin liquids Activity:  Up with assistance DVT Prophylaxis:  Lovenox  CLINICALLY SIGNIFICANT STUDIES Basic Metabolic Panel:   Recent Labs Lab 06/27/13 0916 06/29/13 0635  NA 138 142  K 3.7 3.9  CL 102 106  CO2  --  24  GLUCOSE 187* 187*  BUN 18 16  CREATININE 0.90 0.68  CALCIUM  --  8.7   Liver Function Tests: No  results found for this basename: AST, ALT, ALKPHOS, BILITOT, PROT, ALBUMIN,  in the last 168 hours CBC:   Recent Labs Lab 06/27/13 0900 06/27/13 0916 06/29/13 0635  WBC 12.9*  --  11.8*  NEUTROABS 9.4*  --   --   HGB 14.1 15.0 12.5  HCT 40.7 44.0 37.6  MCV 89.3  --  89.1  PLT 366  --  349   Coagulation: No results found for this basename: LABPROT, INR,  in the last 168 hours Cardiac Enzymes: No results found for this basename: CKTOTAL, CKMB, CKMBINDEX, TROPONINI,  in the last 168 hours Urinalysis:   Recent Labs Lab 06/28/13 1354  COLORURINE YELLOW  LABSPEC 1.029  PHURINE 5.5  GLUCOSEU >1000*  HGBUR NEGATIVE  BILIRUBINUR NEGATIVE  KETONESUR NEGATIVE  PROTEINUR NEGATIVE  UROBILINOGEN 0.2  NITRITE NEGATIVE  LEUKOCYTESUR NEGATIVE   Lipid Panel    Component Value Date/Time   CHOL 156 06/28/2013 0414   TRIG 208* 06/28/2013 0414   HDL 48 06/28/2013 0414   CHOLHDL 3.3 06/28/2013 0414   VLDL 42* 06/28/2013 0414   LDLCALC 66 06/28/2013 0414   HgbA1C  Lab Results  Component Value Date   HGBA1C 7.6 06/09/2013    Urine Drug Screen:   No results found for this  basename: labopia,  cocainscrnur,  labbenz,  amphetmu,  thcu,  labbarb    Alcohol Level: No results found for this basename: ETH,  in the last 168 hours   Dg Chest 2 View 06/28/2013    No acute cardiopulmonary process seen.      Ct Head Wo Contrast 06/27/2013    No acute intracranial abnormality.    MRI/MRA Head Wo Contrast  06/27/2013    Acute infarct left posterior frontal lobe involving cortex and white matter. This measures approximately 10 x 20 mm. Negative intracranial MRA.     2D Echocardiogram EF 55-60% with no source of embolus.   Carotid Doppler  Right = 1-39% ICA stenosis. Left = >80% ICA stenosis,( velocity 618/168 cm/s)  Stenosis on the left appears to have worsened since study from 11/17/12.  EKG  sinus rhythm rate 81 beats per minute  Therapy Recommendations no needs  Physical Exam     Neurologic Examination:  Mental Status:  Alert, oriented, thought content appropriate. Speech speech was fluent for the most part with occasional word finding difficulty as well as occasional paraphasic error. Able to follow commands without difficulty.  Cranial Nerves:  II-Visual fields were normal.  III/IV/VI-Pupils were equal and reacted. Extraocular movements were full and conjugate.  V/VII-no facial numbness and no facial weakness.  VIII-normal.  X-normal speech and symmetrical palatal movement.  Motor: 5/5 bilaterally with normal tone and bulk  Sensory: Normal throughout.  Deep Tendon Reflexes: trace to 1+ and symmetric.  Plantars: Flexor bilaterally  Cerebellar: Normal finger-to-nose testing.  Carotid auscultation: Normal   ASSESSMENT [name] is a 72 y.o. female presenting with speech difficulties. TPA was not used as the patient was beyond the window for treatment and her deficits are minimal . An MRI of revealed an acute infarct left posterior frontal lobe involving cortex and white matter. Infarct felt to be thrombo-embolic due to L ICA stenosis > 80%.  On aspirin 81 mg orally every day prior to admission. Now on aspirin 81 mg orally every day for secondary stroke prevention. Patient with resultant mild aphasia. Work up underway.   Hypertension history  Diabetes mellitus hemoglobin A1c 7.6  Dyslipidemia cholesterol 156 LDL 66. The patient was on crestor prior to admission.  Tobacco history  Hospital day # 2  TREATMENT/PLAN  Increase aspirin to 325 mg daily for secondary stroke prevention  VVS consult to address L ICA stenosis. Ok for surgery in 7-10 days from stroke team perspective  No therapy needs  Risk factor modification - smoking cessation  No further stroke workup indicated.  Patient has a 10-15% risk of having another stroke over the next year, the highest risk is within 2 weeks of the most recent stroke/TIA (risk of having a stroke following  a stroke or TIA is the same).  Ongoing risk factor control by Primary Care Physician  Stroke Service will sign off. Please call should any needs arise.  Follow up with Dr. Pearlean Brownie, Stroke Clinic, in 2 months.  Ok for discharge after VVS consult from stroke team perspective  Annie Main, MSN, RN, ANVP-BC, ANP-BC, [facility] Pager: [phone] 06/29/2013 11:36 AM  I have personally obtained a history, examined the patient, evaluated imaging results, and formulated the assessment and plan of care. I agree with the above. Delia Heady, MD

## 2013-06-29 NOTE — Discharge Summary (Signed)
Physician Discharge Summary  [name] ZOX:[id number] DOB: 1940-11-21 DOA: 06/27/2013  PCP: Carollee Herter, MD  Admit date: 06/27/2013 Discharge date: 06/29/2013  Time spent: 35 minutes  Recommendations for Outpatient Follow-up:  1. Vascular for CEA  Discharge Diagnoses:  Active Problems:   HTN (hypertension)   Type II or unspecified type diabetes mellitus without mention of complication, not stated as uncontrolled   Dyslipidemia   Smoker   Stroke   Discharge Condition: improved  Diet recommendation: cardiac/diabetic  Filed Weights   06/27/13 0848  Weight: 67.132 kg (148 lb)    History of present illness:  [name] is a 72 y.o. female with PMH significant for hypertension, Diabetes, Dyslipidemia, TIA, current smoker who presents to the ED with persistent slurred speech and difficulty finding words. Patient wake up the morning of 12-12 with slurred speech, stuttering words, difficulty finding words. Patient ran out of aspirin 1 week ago. She continue to smoke one pack daily. On my interview she continue to have slurred speech, expressive aphasia. Per family patient speech is better today.  Patient denies chest pain, dyspnea, abdominal pain.  Per daughter patient has been in a lot stress.    Hospital Course:  Acute Stroke:  -MRI show Acute infarct left posterior frontal lobe involving cortex and white matter.  - not taking aspirin for 1 week  -smoker/DM  -presents beyond period window for TPA.  -aspirin  -lipid panel: LDL 66  -ECHO :Study Conclusions - Left ventricle: The cavity size was normal. Systolic function was normal. The estimated ejection fraction was in the range of 55% to 60%. Wall motion was normal; there were no regional wall motion abnormalities. Features are consistent with a pseudonormal left ventricular filling pattern, with concomitant abnormal relaxation and increased filling pressure (grade 2 diastolic dysfunction). - Left  atrium: The atrium was mildly dilated. Impressions: - No cardiac source of emboli was indentified.  -doppler showed left side >80% stenosis- Dr. Darrick Penna- appointment made for pre-op eval PT/OT/SLP - ok for outpatient SLP  tobacco abuse- encourage cessation   HTN: resume home meds   Diabetes: recent HgbA1C at 7. Defer to outpatient     Procedures:  See above  Consultations:  neuro  Discharge Exam: Filed Vitals:   06/29/13 1027  BP: 157/60  Pulse: 62  Temp: 97.9 F (36.6 C)  Resp: 18     Discharge Instructions   Future Appointments Provider Department Dept Phone   09/10/2013 9:15 AM Ronnald Nian, MD [facility] [phone]       Medication List    ASK your doctor about these medications       aspirin 81 MG tablet  Take 81 mg by mouth daily.     lisinopril-hydrochlorothiazide 20-12.5 MG per tablet  Commonly known as:  PRINZIDE,ZESTORETIC  Take 1 tablet by mouth daily.     rosuvastatin 20 MG tablet  Commonly known as:  CRESTOR  Take 1 tablet (20 mg total) by mouth daily.     sitaGLIPtin-metformin 50-1000 MG per tablet  Commonly known as:  JANUMET  Take 1 tablet by mouth 2 (two) times daily with a meal.     verapamil 240 MG CR tablet  Commonly known as:  CALAN-SR  Take 240 mg by mouth daily.       Allergies  Allergen Reactions  . Codeine Nausea And Vomiting       Follow-up Information   Schedule an appointment as soon as possible for a visit with SETHI,PRAMODKUMAR P,  MD. (stroke clinic)    Specialties:  Neurology, Radiology   Contact information:   [address] [place] [address] [phone]       Follow up with Carollee Herter, MD In 1 week.   Specialty:  Family Medicine   Contact information:   [address] [place] [address] [phone]        The results of significant diagnostics from this hospitalization (including imaging, microbiology, ancillary and laboratory) are listed  below for reference.    Significant Diagnostic Studies: Dg Chest 2 View  06/28/2013   CLINICAL DATA:  CVA.  EXAM: CHEST  2 VIEW  COMPARISON:  None.  FINDINGS: The patient's right IJ line is noted ending about the proximal SVC.  The lungs are well-aerated and clear. There is no evidence of focal opacification, pleural effusion or pneumothorax.  The heart is normal in size; the mediastinal contour is within normal limits. No acute osseous abnormalities are seen. Clips are noted within the right upper quadrant, reflecting prior cholecystectomy.  IMPRESSION: No acute cardiopulmonary process seen.   Electronically Signed   By: Roanna Raider M.D.   On: 06/28/2013 05:36   Ct Head Wo Contrast  06/27/2013   CLINICAL DATA:  Right arm paresthesias, speech difficulty, hypertension, diabetes  EXAM: CT HEAD WITHOUT CONTRAST  TECHNIQUE: Contiguous axial images were obtained from the base of the skull through the vertex without contrast.  COMPARISON:  11/16/2012  FINDINGS: Normal appearance of the intracranial structures. No evidence for acute hemorrhage, mass lesion, midline shift, hydrocephalus or large infarct. No acute bony abnormality. Minor right maxillary mucosal thickening. Other sinuses clear. Mastoids clear. Atherosclerosis of the intracranial vessels. .  IMPRESSION: No acute intracranial abnormality.   Electronically Signed   By: Ruel Favors M.D.   On: 06/27/2013 10:15   Mr [name] Head Wo Contrast  06/27/2013   CLINICAL DATA:  Slurred speech.  Stroke.  EXAM: MRI HEAD WITHOUT CONTRAST  MRA HEAD WITHOUT CONTRAST  TECHNIQUE: Multiplanar, multiecho pulse sequences of the brain and surrounding structures were obtained without intravenous contrast. Angiographic images of the head were obtained using MRA technique without contrast.  COMPARISON:  CT 06/27/2013  FINDINGS: MRI HEAD FINDINGS  Acute infarct left posterior frontal cortex extending into the white matter. No other acute infarct.  Mild chronic microvascular  ischemic change in the white matter. Small chronic infarct posterior external capsule on the right. Brainstem and pons are normal.  Negative for intracranial hemorrhage.  Negative for mass or edema.  Mucosal edema right maxillary sinus.  MRA HEAD FINDINGS  Both vertebral arteries are patent to the basilar. The basilar is patent. Superior cerebellar and posterior cerebral arteries are patent bilaterally.  Cavernous carotid is patent bilaterally. Anterior and middle cerebral arteries are widely patent without stenosis.  Negative for cerebral aneurysm.  IMPRESSION: Acute infarct left posterior frontal lobe involving cortex and white matter. This measures approximately 10 x 20 mm.  Negative intracranial MRA.   Electronically Signed   By: Marlan Palau M.D.   On: 06/27/2013 13:17   Mr Brain Wo Contrast  06/27/2013   CLINICAL DATA:  Slurred speech.  Stroke.  EXAM: MRI HEAD WITHOUT CONTRAST  MRA HEAD WITHOUT CONTRAST  TECHNIQUE: Multiplanar, multiecho pulse sequences of the brain and surrounding structures were obtained without intravenous contrast. Angiographic images of the head were obtained using MRA technique without contrast.  COMPARISON:  CT 06/27/2013  FINDINGS: MRI HEAD FINDINGS  Acute infarct left posterior frontal cortex extending into  the white matter. No other acute infarct.  Mild chronic microvascular ischemic change in the white matter. Small chronic infarct posterior external capsule on the right. Brainstem and pons are normal.  Negative for intracranial hemorrhage.  Negative for mass or edema.  Mucosal edema right maxillary sinus.  MRA HEAD FINDINGS  Both vertebral arteries are patent to the basilar. The basilar is patent. Superior cerebellar and posterior cerebral arteries are patent bilaterally.  Cavernous carotid is patent bilaterally. Anterior and middle cerebral arteries are widely patent without stenosis.  Negative for cerebral aneurysm.  IMPRESSION: Acute infarct left posterior frontal lobe  involving cortex and white matter. This measures approximately 10 x 20 mm.  Negative intracranial MRA.   Electronically Signed   By: Marlan Palau M.D.   On: 06/27/2013 13:17    Microbiology: No results found for this or any previous visit (from the past 240 hour(s)).   Labs: Basic Metabolic Panel:  Recent Labs Lab 06/27/13 0900 06/27/13 0916 06/29/13 0635  NA  --  138 142  K  --  3.7 3.9  CL  --  102 106  CO2  --   --  24  GLUCOSE  --  187* 187*  BUN  --  18 16  CREATININE 0.73 0.90 0.68  CALCIUM  --   --  8.7   Liver Function Tests: No results found for this basename: AST, ALT, ALKPHOS, BILITOT, PROT, ALBUMIN,  in the last 168 hours No results found for this basename: LIPASE, AMYLASE,  in the last 168 hours No results found for this basename: AMMONIA,  in the last 168 hours CBC:  Recent Labs Lab 06/27/13 0900 06/27/13 0916 06/29/13 0635  WBC 12.9*  --  11.8*  NEUTROABS 9.4*  --   --   HGB 14.1 15.0 12.5  HCT 40.7 44.0 37.6  MCV 89.3  --  89.1  PLT 366  --  349   Cardiac Enzymes: No results found for this basename: CKTOTAL, CKMB, CKMBINDEX, TROPONINI,  in the last 168 hours BNP: BNP (last 3 results) No results found for this basename: PROBNP,  in the last 8760 hours CBG:  Recent Labs Lab 06/28/13 0653 06/28/13 1153 06/28/13 1643 06/28/13 2208 06/29/13 0642  GLUCAP 202* 161* 195* 214* 192*       Signed:  Georgeann Brinkman  [facility] 06/29/2013, 1:39 PM

## 2013-06-29 NOTE — Progress Notes (Signed)
06/29/2013 1230 UR completed. Isidoro Donning RN CCM Case Mgmt

## 2013-06-29 NOTE — Progress Notes (Signed)
*  PRELIMINARY RESULTS* Vascular Ultrasound Carotid Duplex (Doppler) has been completed.  Preliminary findings: Right = 1-39% ICA stenosis. Left = >80% ICA stenosis,( velocity 618/168 cm/s)  Stenosis on the left appears to have worsened since study from 11/17/12.  Farrel Demark, RDMS, RVT  06/29/2013, 10:15 AM

## 2013-06-29 NOTE — Consult Note (Addendum)
[facility] [place] Consultation  Requesting: Dr Benjamine Mola Reason for consult: Stroke with left carotid stenosis History of Present Illness:  Patient is a 72 y.o. year old female who presents for evaluation of left carotid stenosis with stroke.  Pt has had difficulty finding words since 06/26/13.  She states it has improved some but not resolved.  She denies any numbness or weakness of face or extremities.  She had a TIA 5/14 which caused her to have right hand numbness.  Carotid duplex at that time showed 60-80% stenosis of the left carotid.  The left ICA stenosis has now progressed to > 80%.  She denies shortness of breath or chest pain.  She currently smokes 1/2 ppd cigarettes.  Greater than 3 min spent today regarding smoking cessation.  Other medical problems include hypertension, diabetes, hyperlipidemia all of which are currently stable.  She is on aspirin and a statin.  Past Medical History  Diagnosis Date  . Hypertension   . Diabetes mellitus   . Neuropathy, diabetic   . Dyslipidemia   . Smoker     Past Surgical History  Procedure Laterality Date  . Cholecystectomy    Left knee surgery to repair patella  Social History History  Substance Use Topics  . Smoking status: Current Every Day Smoker  . Smokeless tobacco: Not on file  . Alcohol Use: No    Family History History reviewed. No pertinent family history.  Allergies  Allergies  Allergen Reactions  . Codeine Nausea And Vomiting     Current Facility-Administered Medications  Medication Dose Route Frequency Provider Last Rate Last Dose  . 0.9 %  sodium chloride infusion   Intravenous Continuous Belkys A Regalado, MD 75 mL/hr at 06/27/13 1755    . [START ON 06/30/2013] aspirin EC tablet 325 mg  325 mg Oral Daily Layne Benton, NP      . atorvastatin (LIPITOR) tablet 40 mg  40 mg Oral q1800 Belkys A Regalado, MD   40 mg at 06/27/13 1808  . enoxaparin (LOVENOX) injection 40 mg  40 mg Subcutaneous  Q24H Belkys A Regalado, MD   40 mg at 06/28/13 2115  . insulin aspart (novoLOG) injection 0-9 Units  0-9 Units Subcutaneous TID WC Belkys A Regalado, MD   3 Units at 06/29/13 1209  . senna-docusate (Senokot-S) tablet 1 tablet  1 tablet Oral QHS PRN Belkys A Regalado, MD      . verapamil (CALAN-SR) CR tablet 120 mg  120 mg Oral Daily Belkys A Regalado, MD   120 mg at 06/29/13 1048    ROS:   General:  No weight loss, Fever, chills  HEENT: No recent headaches, no nasal bleeding, no visual changes, no sore throat  Neurologic: No dizziness, blackouts, seizures. + recent symptoms of stroke or mini- stroke. No recent episodes of slurred speech, or temporary blindness.  +aphasia  Cardiac: No recent episodes of chest pain/pressure, no shortness of breath at rest.  No shortness of breath with exertion.  Denies history of atrial fibrillation or irregular heartbeat  Vascular: No history of rest pain in feet.  No history of claudication.  No history of non-healing ulcer, No history of DVT   Pulmonary: No home oxygen, no productive cough, no hemoptysis,  No asthma or wheezing  Musculoskeletal:  [ ]  Arthritis, [ ]  Low back pain,  [ ]  Joint pain  Hematologic:No history of hypercoagulable state.  No history of easy bleeding.  No history of anemia  Gastrointestinal: No hematochezia  or melena,  No gastroesophageal reflux, no trouble swallowing  Urinary: [ ]  chronic Kidney disease, [ ]  on HD - [ ]  MWF or [ ]  TTHS, [ ]  Burning with urination, [ ]  Frequent urination, [ ]  Difficulty urinating;   Skin: No rashes  Psychological: No history of anxiety,  No history of depression   Physical Examination  Filed Vitals:   06/28/13 2210 06/29/13 0115 06/29/13 0525 06/29/13 1027  BP: 128/52 154/72 153/66 157/60  Pulse: 66 64 71 62  Temp: 97.8 F (36.6 C) 97.9 F (36.6 C) 97.9 F (36.6 C) 97.9 F (36.6 C)  TempSrc: Oral Oral Oral Oral  Resp: 18 18 18 18   Height:      Weight:      SpO2: 97% 100% 99% 98%     Body mass index is 23.9 kg/(m^2).  General:  Alert and oriented, no acute distress HEENT: Normal Neck: No bruit or JVD Pulmonary: Clear to auscultation bilaterally Cardiac: Regular Rate and Rhythm without murmur Abdomen: Soft, non-tender, non-distended, no mass Skin: No rash Extremity Pulses:  2+ radial, brachial, femoral, dorsalis pedis right side, absent left DP pulse Musculoskeletal: No deformity or edema  Neurologic: Upper and lower extremity motor 5/5 and symmetric, no facial asymmetry, difficulty gathering words but no slurring  DATA:  Carotid duplex left ICA 613/163, right 83/24 <40%, head CT no bleed, head MRI left posterior frontal acute infarct, chronic right external capsule   ASSESSMENT:  Symptomatic left ICA stenosis   PLAN:  Continue aspirin.  Left CEA Tuesday December 30th.  Pt may be discharged from my standpoint today.  Fabienne Bruns, MD [facility] [place] Office: [phone] Pager: [phone]   For VQI Use Only     PRE-ADM LIVING: Home  AMB STATUS: Ambulatory  CAD Sx: None  PRIOR CHF: None  STRESS TEST: [x ] No, [ ]  Normal, [ ]  + ischemia, [ ]  + MI, [ ]  Both

## 2013-06-29 NOTE — Progress Notes (Signed)
Talked to patient about DCP/ outpatient speech therapy; Patient lives in [place]; referral made to [facility] [phone]); CM talked to [facility] at the rehab department; Orders/ clinical information faxed as requested; B Shelba Flake

## 2013-06-30 ENCOUNTER — Encounter (HOSPITAL_COMMUNITY): Payer: Self-pay | Admitting: Respiratory Therapy

## 2013-06-30 ENCOUNTER — Other Ambulatory Visit: Payer: Self-pay

## 2013-06-30 NOTE — ED Provider Notes (Signed)
  This was a shared visit with a mid-level provided (NP or PA).  Throughout the patient's course I was available for consultation/collaboration.  I saw the ECG (if appropriate), relevant labs and studies - I agree with the interpretation.  On my exam the patient was in no distress, but she had ongoing inconsistent expressive aphasia.  However, with no neurologic deficits, time of onset yesterday, there was initial concern for stroke.  Patient's MRI demonstrated this. We discussed the patient's case with our neurology team, and the patient was admitted for further evaluation and management.      Gerhard Munch, MD 06/30/13 1537

## 2013-06-30 NOTE — Telephone Encounter (Signed)
lm

## 2013-07-02 NOTE — Pre-Procedure Instructions (Signed)
[name]  07/02/2013   Your procedure is scheduled on:  Tues, Dec 30 @ 7:30 AM  Report to [facility] Short Stay Entrance A  at 5:30 AM.  Call this number if you have problems the morning of surgery: [phone]   Remember:   Do not eat food or drink liquids after midnight.                No Goody's,BC's,Aleve,Ibuprofen,Fish Oil,or any Herbal Medications   Do not wear jewelry, make-up or nail polish.  Do not wear lotions, powders, or perfumes. You may wear deodorant.  Do not shave 48 hours prior to surgery.   Do not bring valuables to the hospital.  [facility] is not responsible                  for any belongings or valuables.               Contacts, dentures or bridgework may not be worn into surgery.  Leave suitcase in the car. After surgery it may be brought to your room.  For patients admitted to the hospital, discharge time is determined by your                treatment team.                 Special Instructions: Shower using CHG 2 nights before surgery and the night before surgery.  If you shower the day of surgery use CHG.  Use special wash - you have one bottle of CHG for all showers.  You should use approximately 1/3 of the bottle for each shower.   Please read over the following fact sheets that you were given: Pain Booklet, Coughing and Deep Breathing, Blood Transfusion Information, MRSA Information and Surgical Site Infection Prevention

## 2013-07-03 ENCOUNTER — Encounter (HOSPITAL_COMMUNITY): Payer: Self-pay

## 2013-07-03 ENCOUNTER — Encounter (HOSPITAL_COMMUNITY)
Admission: RE | Admit: 2013-07-03 | Discharge: 2013-07-03 | Disposition: A | Discharge status: Home / Self Care | Payer: Medicare Other | Source: Ambulatory Visit | Attending: Vascular Surgery | Admitting: Vascular Surgery

## 2013-07-03 DIAGNOSIS — Z01812 Encounter for preprocedural laboratory examination: Secondary | ICD-10-CM | POA: Insufficient documentation

## 2013-07-03 HISTORY — DX: Cerebral infarction, unspecified: I63.9

## 2013-07-03 HISTORY — DX: Dizziness and giddiness: R42

## 2013-07-03 HISTORY — DX: Nausea with vomiting, unspecified: R11.2

## 2013-07-03 HISTORY — DX: Other specified postprocedural states: Z98.890

## 2013-07-03 HISTORY — DX: Unspecified speech disturbances: R47.9

## 2013-07-03 LAB — CBC
HCT: 37.5 % (ref 36.0–46.0)
Hemoglobin: 12.7 g/dL (ref 12.0–15.0)
MCH: 30.2 pg (ref 26.0–34.0)
MCV: 89.3 fL (ref 78.0–100.0)
Platelets: 371 10*3/uL (ref 150–400)
RBC: 4.2 MIL/uL (ref 3.87–5.11)
RDW: 14.7 % (ref 11.5–15.5)
WBC: 18.3 10*3/uL — ABNORMAL HIGH (ref 4.0–10.5)

## 2013-07-03 LAB — URINALYSIS, ROUTINE W REFLEX MICROSCOPIC
Glucose, UA: 250 mg/dL — AB
Hgb urine dipstick: NEGATIVE
Ketones, ur: 15 mg/dL — AB
Leukocytes, UA: NEGATIVE
Protein, ur: NEGATIVE mg/dL
Urobilinogen, UA: 0.2 mg/dL (ref 0.0–1.0)
pH: 5 (ref 5.0–8.0)

## 2013-07-03 LAB — COMPREHENSIVE METABOLIC PANEL
AST: 9 U/L (ref 0–37)
BUN: 15 mg/dL (ref 6–23)
CO2: 26 mEq/L (ref 19–32)
Calcium: 9.4 mg/dL (ref 8.4–10.5)
Creatinine, Ser: 0.91 mg/dL (ref 0.50–1.10)
GFR calc non Af Amer: 62 mL/min — ABNORMAL LOW (ref >90)
Sodium: 138 mEq/L (ref 135–145)
Total Bilirubin: 1.3 mg/dL — ABNORMAL HIGH (ref 0.3–1.2)

## 2013-07-03 LAB — APTT: aPTT: 30 seconds (ref 24–37)

## 2013-07-03 LAB — PROTIME-INR
INR: 1.01 (ref 0.00–1.49)
Prothrombin Time: 13.1 seconds (ref 11.6–15.2)

## 2013-07-03 LAB — TYPE AND SCREEN
ABO/RH(D): O POS
Antibody Screen: NEGATIVE

## 2013-07-03 LAB — SURGICAL PCR SCREEN: MRSA, PCR: NEGATIVE

## 2013-07-03 NOTE — Progress Notes (Addendum)
  Pt doesn't have a cardiologist  Echo report in epic from 11/2012  Denies ever having a stress test or heart cath  Dr.John Susann Givens is Medical Md  EKG and CXR in epic from 06-27-13

## 2013-07-03 NOTE — Progress Notes (Addendum)
Anesthesia Chart Review:  Patient is a 72 year old female scheduled for left CEA on 07/14/13 by Dr. Darrick Penna. History includes CVA (acute infarct left posterior frontal lobe per 06/27/13 MRI) with mild residual dysarthria with work-up revealing high grade left ICA stenosis, HTN, DM2 with neuropathy, dyslipidemia, smoking, post-operative N/V.  PCP is  Dr. Sharlot Gowda, currently with scheduled appointment for 07/07/13.    EKG on 06/27/13 showed NSR, right BBB.  Echo on 06/28/13 showed: - Left ventricle: The cavity size was normal. Systolic function was normal. The estimated ejection fraction was in the range of 55% to 60%. Wall motion was normal; there were no regional wall motion abnormalities. Features are consistent with a pseudonormal left ventricular filling pattern, with concomitant abnormal relaxation and increased filling pressure (grade 2 diastolic dysfunction). - Left atrium: The atrium was mildly dilated. - Impressions: No cardiac source of emboli was indentified.  Carotid duplex on 06/29/13 showed: - The vertebral arteries appear patent with antegrade flow. - Findings consistent with1- 39 percent stenosis involving the right internal carotid artery. - Findings consistent with greater than 80 percent stenosis involving the left internal carotid artery.  CXR on 06/27/13 showed no acute cardiopulmonary process.  Preoperative labs noted.  WBC elevated at 18.3K. UA was negative for leukocytes and nitrites.  A1C on 06/09/13 was 7.6. CBC routed to Dr. Darrick Penna and [facility] nurses Darel Hong and Okey Regal for additional preoperative recommendations.  I'll follow-up if any additional recommendations received.  Velna [facility] Short Stay Center/Anesthesiology Phone [phone] 07/03/2013 2:43 PM  Addendum: 07/06/2013 10:05 AM Darel Hong from [facility] called regarding elevated WBC.  She is contacting Dr. Susann Givens for further evaluation since he is scheduled to see patient on 07/07/13. She will let me know if  there are any new recommendations or preoperative orders.  Addendum: 07/08/2013 1:15 PM Patient was seen by Dr. Susann Givens yesterday. He was aware of leukocytosis and plans for surgery. He repeated CBC and it was down to 12.8, which was more around her baseline.  She reported that she had stopped smoking.  She denied symptoms of infection. I will not plan on ordering any repeat preoperative labs since her WBC is now < 15 K.

## 2013-07-06 ENCOUNTER — Ambulatory Visit (HOSPITAL_COMMUNITY)
Admission: RE | Admit: 2013-07-06 | Discharge: 2013-07-06 | Disposition: A | Discharge status: Home / Self Care | Payer: Medicare Other | Source: Ambulatory Visit | Attending: Orthopedic Surgery | Admitting: Orthopedic Surgery

## 2013-07-06 DIAGNOSIS — IMO0001 Reserved for inherently not codable concepts without codable children: Secondary | ICD-10-CM | POA: Insufficient documentation

## 2013-07-06 DIAGNOSIS — E119 Type 2 diabetes mellitus without complications: Secondary | ICD-10-CM | POA: Insufficient documentation

## 2013-07-06 DIAGNOSIS — I10 Essential (primary) hypertension: Secondary | ICD-10-CM | POA: Insufficient documentation

## 2013-07-06 DIAGNOSIS — I69920 Aphasia following unspecified cerebrovascular disease: Secondary | ICD-10-CM | POA: Insufficient documentation

## 2013-07-06 DIAGNOSIS — I69320 Aphasia following cerebral infarction: Secondary | ICD-10-CM | POA: Insufficient documentation

## 2013-07-06 NOTE — Evaluation (Signed)
Speech Language Pathology Evaluation Patient Details  Name: [name] MRN: [id number] Date of Birth: 29-Sep-1940  Today's Date: 07/06/2013 Time: 0930-1015 SLP Time Calculation (min): 45 min  Authorization: [employer]  Authorization Time Period: 07/06/2013- 08/04/2013  Authorization Visit#: Authorization - Visit Number: 1 of 8   Past Medical History:  Past Medical History  Diagnosis Date  . Neuropathy, diabetic   . Dyslipidemia     takes Crestor daily  . Smoker   . Hypertension     takes Prinzide and Verapamil daily  . Diabetes mellitus     takes Janumet daily  . PONV (postoperative nausea and vomiting)   . Impaired speech     from stroke  . Vertigo     but doesn't take any meds  . Stroke 06/25/13   Past Surgical History:  Past Surgical History  Procedure Laterality Date  . Cholecystectomy    . Knee surgery Left 5yrs ago  . Cataract removed Right    HPI:  HPI: 72 yo female adm to [facility] with speech disturbance, found to have acute left posterior frontal lobe CVA.  PMH + for DM, smoker.  Pt resides with spouse, daughter [name] and granddaughter [name].  She is retired since age 71, has a 10th grade education and used to be a personal care attendant to elderly.   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 : No Is the patient reluctant to leave their home because of a fear of falling? : No  Cognition  Overall Cognitive Status: Within Functional Limits for tasks assessed Arousal/Alertness:  Awake/alert Orientation Level: Oriented X4 Memory: Appears intact Awareness: Appears intact Problem Solving: Appears intact Executive Function: Organizing;Reasoning Reasoning: Impaired Reasoning Impairment: Verbal complex Organizing: Impaired Organizing Impairment: Verbal complex Decision Making: Appears intact Safety/Judgment: Appears intact  Comprehension  Auditory Comprehension Overall Auditory Comprehension: Appears within functional limits for tasks assessed Yes/No Questions: Within Functional Limits Commands: Within Functional Limits Conversation: Complex Visual Recognition/Discrimination Discrimination: Within Function Limits Reading Comprehension Reading Status: Impaired Word level: Within functional limits Sentence Level: Impaired Paragraph Level: Impaired Functional Environmental (signs, name badge): Within functional limits Effective Techniques: Verbal cueing  Expression  Expression Primary Mode of Expression: Verbal Verbal Expression Overall Verbal Expression: Impaired Initiation: Impaired Level of Generative/Spontaneous Verbalization: Sentence;Conversation Repetition: No impairment Naming: Impairment Responsive: 76-100% accurate Confrontation: Within functional limits Convergent: 75-100% accurate Divergent: 50-74% accurate Other Naming Comments: Increased difficulty with abstract naming tasks Verbal Errors: Semantic paraphasias;Aware of errors;Not aware of errors Pragmatics: No impairment Effective Techniques: Phonemic cues Non-Verbal Means of Communication: Not applicable Written Expression Dominant Hand: Right Written Expression: Within Functional Limits (WFL for personal/bio information)  Oral/Motor  Oral Motor/Sensory Function Overall Oral Motor/Sensory Function: Appears within functional limits for tasks assessed Motor Speech Overall Motor Speech: Impaired Respiration: Within functional limits Phonation: Normal Resonance: Within functional  limits Articulation: Within functional limitis Intelligibility: Intelligible Motor Planning:  (to be further assessed) Motor Speech Errors: Aware;Groping for words Effective Techniques: Slow rate  SLP Goals  Home Exercise SLP Goal: Patient will Perform Home Exercise Program: with supervision, verbal cues required/provided SLP Short Term Goals SLP Short  Term Goal 1: Pt will increase abstract naming tasks to 5+ words per category with mi/mod cues (baseline is 2). SLP Short Term Goal 2: Pt will verbally describe pictures in complete sentences with min cues for fluency and content. SLP Short Term Goal 3: Pt will verbally retell simple paragraph- length stories with 5+ details with min cues and use of srategies. SLP Short Term Goal 4: Pt will increase reading comprehension of short paragraphs to 90% acc via multiple choice responses when provided min cues. SLP Long Term Goals SLP Long Term Goal 1: Increase verbal expression to [facility] for moderately complex information when communicating with friends and family.  Assessment/Plan  Patient Active Problem List   Diagnosis Date Noted  . Aphasia due to recent cerebral infarction 07/06/2013  . Occlusion and stenosis of carotid artery with cerebral infarction 06/29/2013  . Type II or unspecified type diabetes mellitus with neurological manifestations, not stated as uncontrolled(250.60) 06/29/2013  . Stroke 06/27/2013  . Leukocytosis 11/17/2012  . TIA (transient ischemic attack) 11/16/2012  . HTN (hypertension) 01/04/2011  . Type II or unspecified type diabetes mellitus without mention of complication, not stated as uncontrolled 01/04/2011  . Dyslipidemia 01/04/2011  . Smoker 01/04/2011   SLP - End of Session Activity Tolerance: Patient tolerated treatment well General Behavior During Therapy: WFL for tasks assessed/performed  SLP Assessment/Plan Clinical Impression Statement: Mild expressive language and motor planning deficits persist. Verbal  output is mildly dysfluent with decreased content and gramatical errors (mostly labeling objects/nouns without linking together for complete sentences. Pt completed confrontation and responsive naming tasks with ease, but has more difficulty with self generation in abstract naming tasks and in verbal retelling tasks. Reading comprehension  is adequate for basic-level sentence length material, however pt struggles with paragraph and moderately complex written information. Pt would benefit from skilled speech/language  intervention in outpatient setting to maximize functional independence with communication in home/community environment. Speech Therapy Frequency: min 2x/week Duration: 4 weeks Potential to Achieve Goals: Good Potential Considerations:  (question motivation level for therapy)  GN Functional Assessment Tool Used: informal cognitive linguisic measures and clinical judgement Functional Limitations: Spoken language expressive Spoken Language Expression Current Status [phone]): At least 20 percent but less than 40 percent impaired, limited or restricted Spoken Language Expression Goal Status [phone]): At least 1 percent but less than 20 percent impaired, limited or restricted  Thank you,  Havery Moros, CCC-SLP [phone]  PORTER,DABNEY 07/06/2013, 2:42 PM  Physician Documentation Your signature is required to indicate approval of the treatment plan as stated above.  Please sign and either send electronically or make a copy of this report for your files and return this physician signed original.  Please mark one 1.__approve of plan  2. ___approve of plan with the following conditions.   ______________________________                                                          _____________________ Physician Signature  Date  

## 2013-07-07 ENCOUNTER — Ambulatory Visit (INDEPENDENT_AMBULATORY_CARE_PROVIDER_SITE_OTHER): Payer: Medicare Other | Admitting: Family Medicine

## 2013-07-07 ENCOUNTER — Encounter: Payer: Self-pay | Admitting: Family Medicine

## 2013-07-07 VITALS — BP 136/68

## 2013-07-07 DIAGNOSIS — E1140 Type 2 diabetes mellitus with diabetic neuropathy, unspecified: Secondary | ICD-10-CM

## 2013-07-07 DIAGNOSIS — E1149 Type 2 diabetes mellitus with other diabetic neurological complication: Secondary | ICD-10-CM

## 2013-07-07 DIAGNOSIS — Z87891 Personal history of nicotine dependence: Secondary | ICD-10-CM

## 2013-07-07 DIAGNOSIS — I6350 Cerebral infarction due to unspecified occlusion or stenosis of unspecified cerebral artery: Secondary | ICD-10-CM

## 2013-07-07 DIAGNOSIS — I639 Cerebral infarction, unspecified: Secondary | ICD-10-CM

## 2013-07-07 DIAGNOSIS — I63239 Cerebral infarction due to unspecified occlusion or stenosis of unspecified carotid arteries: Secondary | ICD-10-CM

## 2013-07-07 DIAGNOSIS — E1142 Type 2 diabetes mellitus with diabetic polyneuropathy: Secondary | ICD-10-CM

## 2013-07-07 DIAGNOSIS — D72829 Elevated white blood cell count, unspecified: Secondary | ICD-10-CM

## 2013-07-07 LAB — CBC WITH DIFFERENTIAL/PLATELET
Basophils Relative: 0 % (ref 0–1)
Eosinophils Absolute: 0.5 10*3/uL (ref 0.0–0.7)
HCT: 38.4 % (ref 36.0–46.0)
Hemoglobin: 12.9 g/dL (ref 12.0–15.0)
Lymphs Abs: 3.6 10*3/uL (ref 0.7–4.0)
MCH: 29.4 pg (ref 26.0–34.0)
MCHC: 33.6 g/dL (ref 30.0–36.0)
MCV: 87.5 fL (ref 78.0–100.0)
Monocytes Absolute: 0.8 10*3/uL (ref 0.1–1.0)
Monocytes Relative: 7 % (ref 3–12)
Neutrophils Relative %: 61 % (ref 43–77)
RBC: 4.39 MIL/uL (ref 3.87–5.11)

## 2013-07-07 LAB — GLUCOSE, POCT (MANUAL RESULT ENTRY): POC Glucose: 200 mg/dl — AB (ref 70–99)

## 2013-07-07 NOTE — Progress Notes (Signed)
   Subjective:    Patient ID: [name], female    DOB: Dec 14, 1940, 72 y.o.   MRN: [id number]  HPI He is here for followup after recent hospitalization and treatment for CVA. She was found to have carotid stenosis and is scheduled for surgery. She also was noted to have an elevated white count. She does not complaining of fever, chills, cough, congestion, abdominal pain or urinary symptoms. She has quit smoking. She has not been checking her blood sugars stating that she does not have the proper strips. They're being sent her. She has quit smoking.   Review of Systems     Objective:   Physical Exam alert and in no distress. Tympanic membranes and canals are normal. Normal speech pattern is noted .Throat is clear. Tonsils are normal. Neck is supple without adenopathy or thyromegaly. Cardiac exam shows a regular sinus rhythm without murmurs or gallops. Lungs are clear to auscultation. Left carotid bruit noted. Abdominal exam shows no masses or tenderness. Two-hour postprandial blood sugar was 200 The hospital record was reviewed.     Assessment & Plan:  Elevated white blood cell count - Plan: CBC with Differential  Occlusion and stenosis of carotid artery with cerebral infarction  Diabetes mellitus with neuropathy - Plan: Glucose (CBG)  Former smoker  Type II or unspecified type diabetes mellitus with neurological manifestations, not stated as uncontrolled(250.60)  Stroke  encouraged her to continue to check her blood sugars regularly.

## 2013-07-13 MED ORDER — DEXTROSE 5 % IV SOLN
1.5000 g | INTRAVENOUS | Status: AC
  Administered 2013-07-14: 1.5 g via INTRAVENOUS
  Filled 2013-07-13: qty 1.5

## 2013-07-14 ENCOUNTER — Encounter (HOSPITAL_COMMUNITY): Payer: Medicare Other | Admitting: Vascular Surgery

## 2013-07-14 ENCOUNTER — Encounter (HOSPITAL_COMMUNITY)
Admission: RE | Disposition: A | Discharge status: Home / Self Care | Payer: Self-pay | Source: Ambulatory Visit | Attending: Vascular Surgery

## 2013-07-14 ENCOUNTER — Telehealth: Payer: Self-pay | Admitting: Vascular Surgery

## 2013-07-14 ENCOUNTER — Inpatient Hospital Stay (HOSPITAL_COMMUNITY)
Admission: RE | Admit: 2013-07-14 | Discharge: 2013-07-15 | DRG: 039 | Disposition: A | Discharge status: Home / Self Care | Payer: Medicare Other | Source: Ambulatory Visit | Attending: Vascular Surgery | Admitting: Vascular Surgery

## 2013-07-14 ENCOUNTER — Inpatient Hospital Stay (HOSPITAL_COMMUNITY): Payer: Medicare Other | Admitting: Anesthesiology

## 2013-07-14 ENCOUNTER — Encounter (HOSPITAL_COMMUNITY): Payer: Self-pay | Admitting: *Deleted

## 2013-07-14 DIAGNOSIS — F17200 Nicotine dependence, unspecified, uncomplicated: Secondary | ICD-10-CM | POA: Diagnosis present

## 2013-07-14 DIAGNOSIS — I10 Essential (primary) hypertension: Secondary | ICD-10-CM | POA: Diagnosis present

## 2013-07-14 DIAGNOSIS — E1149 Type 2 diabetes mellitus with other diabetic neurological complication: Secondary | ICD-10-CM | POA: Diagnosis present

## 2013-07-14 DIAGNOSIS — I6529 Occlusion and stenosis of unspecified carotid artery: Secondary | ICD-10-CM

## 2013-07-14 DIAGNOSIS — I63239 Cerebral infarction due to unspecified occlusion or stenosis of unspecified carotid arteries: Principal | ICD-10-CM | POA: Diagnosis present

## 2013-07-14 DIAGNOSIS — Z7982 Long term (current) use of aspirin: Secondary | ICD-10-CM

## 2013-07-14 DIAGNOSIS — E785 Hyperlipidemia, unspecified: Secondary | ICD-10-CM | POA: Diagnosis present

## 2013-07-14 DIAGNOSIS — E1142 Type 2 diabetes mellitus with diabetic polyneuropathy: Secondary | ICD-10-CM | POA: Diagnosis present

## 2013-07-14 DIAGNOSIS — Z9889 Other specified postprocedural states: Secondary | ICD-10-CM | POA: Diagnosis present

## 2013-07-14 HISTORY — PX: PATCH ANGIOPLASTY: SHX6230

## 2013-07-14 HISTORY — PX: ENDARTERECTOMY: SHX5162

## 2013-07-14 LAB — GLUCOSE, CAPILLARY
Glucose-Capillary: 201 mg/dL — ABNORMAL HIGH (ref 70–99)
Glucose-Capillary: 170 mg/dL — ABNORMAL HIGH (ref 70–99)

## 2013-07-14 LAB — CBC
Hemoglobin: 10.5 g/dL — ABNORMAL LOW (ref 12.0–15.0)
MCH: 29.4 pg (ref 26.0–34.0)
MCHC: 33.3 g/dL (ref 30.0–36.0)
Platelets: 402 10*3/uL — ABNORMAL HIGH (ref 150–400)
RDW: 14.4 % (ref 11.5–15.5)

## 2013-07-14 LAB — CREATININE, SERUM
Creatinine, Ser: 0.75 mg/dL (ref 0.50–1.10)
GFR calc Af Amer: >90 mL/min (ref >90)

## 2013-07-14 SURGERY — ENDARTERECTOMY, CAROTID
Anesthesia: General | Site: Neck | Laterality: Left

## 2013-07-14 MED ORDER — LACTATED RINGERS IV SOLN
INTRAVENOUS | Status: DC | PRN
  Administered 2013-07-14 (×2): via INTRAVENOUS

## 2013-07-14 MED ORDER — ALBUMIN HUMAN 5 % IV SOLN
INTRAVENOUS | Status: AC
  Administered 2013-07-14: 12.5 g via INTRAVENOUS
  Filled 2013-07-14: qty 250

## 2013-07-14 MED ORDER — SITAGLIPTIN PHOS-METFORMIN HCL 50-1000 MG PO TABS: 1.0000 | ORAL_TABLET | Freq: Two times a day (BID) | ORAL | Status: DC

## 2013-07-14 MED ORDER — PHENOL 1.4 % MT LIQD: 1.0000 | OROMUCOSAL | Status: DC | PRN

## 2013-07-14 MED ORDER — LIDOCAINE HCL (PF) 1 % IJ SOLN
INTRAMUSCULAR | Status: AC
  Filled 2013-07-14: qty 30

## 2013-07-14 MED ORDER — LABETALOL HCL 5 MG/ML IV SOLN
10.0000 mg | INTRAVENOUS | Status: DC | PRN
  Filled 2013-07-14: qty 4

## 2013-07-14 MED ORDER — METOPROLOL TARTRATE 1 MG/ML IV SOLN
2.0000 mg | INTRAVENOUS | Status: DC | PRN
  Filled 2013-07-14: qty 5

## 2013-07-14 MED ORDER — SODIUM CHLORIDE 0.9 % IV SOLN
INTRAVENOUS | Status: DC
  Administered 2013-07-14: 75 mL via INTRAVENOUS

## 2013-07-14 MED ORDER — HYDRALAZINE HCL 20 MG/ML IJ SOLN
10.0000 mg | INTRAMUSCULAR | Status: DC | PRN
  Filled 2013-07-14: qty 0.5

## 2013-07-14 MED ORDER — DOCUSATE SODIUM 100 MG PO CAPS
100.0000 mg | ORAL_CAPSULE | Freq: Every day | ORAL | Status: DC
  Administered 2013-07-15: 100 mg via ORAL
  Filled 2013-07-14: qty 1

## 2013-07-14 MED ORDER — HEPARIN SODIUM (PORCINE) 1000 UNIT/ML IJ SOLN
INTRAMUSCULAR | Status: DC | PRN
  Administered 2013-07-14: 7000 [IU] via INTRAVENOUS

## 2013-07-14 MED ORDER — ONDANSETRON HCL 4 MG/2ML IJ SOLN
INTRAMUSCULAR | Status: DC | PRN
  Administered 2013-07-14 (×2): 4 mg via INTRAVENOUS

## 2013-07-14 MED ORDER — FENTANYL CITRATE 0.05 MG/ML IJ SOLN: 25.0000 ug | INTRAMUSCULAR | Status: DC | PRN

## 2013-07-14 MED ORDER — OXYCODONE HCL 5 MG/5ML PO SOLN: 5.0000 mg | Freq: Once | ORAL | Status: DC | PRN

## 2013-07-14 MED ORDER — FENTANYL CITRATE 0.05 MG/ML IJ SOLN
INTRAMUSCULAR | Status: DC | PRN
  Administered 2013-07-14 (×2): 50 ug via INTRAVENOUS

## 2013-07-14 MED ORDER — OXYCODONE HCL 5 MG PO TABS: 5.0000 mg | ORAL_TABLET | ORAL | Status: DC | PRN

## 2013-07-14 MED ORDER — ALBUMIN HUMAN 25 % IV SOLN: 12.5000 g | Freq: Once | INTRAVENOUS | Status: DC

## 2013-07-14 MED ORDER — SODIUM CHLORIDE 0.9 % IV SOLN: INTRAVENOUS | Status: DC

## 2013-07-14 MED ORDER — ZOLPIDEM TARTRATE 5 MG PO TABS: 5.0000 mg | ORAL_TABLET | Freq: Every evening | ORAL | Status: DC | PRN

## 2013-07-14 MED ORDER — LACTATED RINGERS IV BOLUS (SEPSIS): 500.0000 mL | Freq: Once | INTRAVENOUS | Status: DC

## 2013-07-14 MED ORDER — ENOXAPARIN SODIUM 30 MG/0.3ML ~~LOC~~ SOLN
30.0000 mg | SUBCUTANEOUS | Status: DC
  Filled 2013-07-14: qty 0.3

## 2013-07-14 MED ORDER — ROCURONIUM BROMIDE 100 MG/10ML IV SOLN
INTRAVENOUS | Status: DC | PRN
  Administered 2013-07-14: 50 mg via INTRAVENOUS

## 2013-07-14 MED ORDER — NEOSTIGMINE METHYLSULFATE 1 MG/ML IJ SOLN
INTRAMUSCULAR | Status: DC | PRN
  Administered 2013-07-14: 3 mg via INTRAVENOUS

## 2013-07-14 MED ORDER — ONDANSETRON HCL 4 MG/2ML IJ SOLN
INTRAMUSCULAR | Status: AC
  Filled 2013-07-14: qty 2

## 2013-07-14 MED ORDER — ONDANSETRON HCL 4 MG/2ML IJ SOLN: 4.0000 mg | Freq: Four times a day (QID) | INTRAMUSCULAR | Status: DC | PRN

## 2013-07-14 MED ORDER — METFORMIN HCL 500 MG PO TABS
1000.0000 mg | ORAL_TABLET | Freq: Two times a day (BID) | ORAL | Status: DC
  Administered 2013-07-15: 1000 mg via ORAL
  Filled 2013-07-14 (×4): qty 2

## 2013-07-14 MED ORDER — VERAPAMIL HCL ER 240 MG PO TBCR
240.0000 mg | EXTENDED_RELEASE_TABLET | Freq: Every day | ORAL | Status: DC
  Administered 2013-07-15: 240 mg via ORAL
  Filled 2013-07-14 (×2): qty 1

## 2013-07-14 MED ORDER — METOCLOPRAMIDE HCL 5 MG/ML IJ SOLN
10.0000 mg | Freq: Once | INTRAMUSCULAR | Status: AC
  Administered 2013-07-14: 10 mg via INTRAVENOUS

## 2013-07-14 MED ORDER — POTASSIUM CHLORIDE CRYS ER 20 MEQ PO TBCR
20.0000 meq | EXTENDED_RELEASE_TABLET | Freq: Every day | ORAL | Status: DC | PRN
  Filled 2013-07-14: qty 2

## 2013-07-14 MED ORDER — ASPIRIN EC 325 MG PO TBEC
325.0000 mg | DELAYED_RELEASE_TABLET | Freq: Every day | ORAL | Status: DC
Start: 2013-07-15 — End: 2013-07-15
  Administered 2013-07-15: 325 mg via ORAL
  Filled 2013-07-14: qty 1

## 2013-07-14 MED ORDER — PROTAMINE SULFATE 10 MG/ML IV SOLN
INTRAVENOUS | Status: DC | PRN
  Administered 2013-07-14 (×3): 20 mg via INTRAVENOUS
  Administered 2013-07-14: 10 mg via INTRAVENOUS

## 2013-07-14 MED ORDER — INSULIN ASPART 100 UNIT/ML ~~LOC~~ SOLN
0.0000 [IU] | Freq: Three times a day (TID) | SUBCUTANEOUS | Status: DC
  Administered 2013-07-14 – 2013-07-15 (×2): 2 [IU] via SUBCUTANEOUS
  Filled 2013-07-14 (×26): qty 0.09

## 2013-07-14 MED ORDER — ACETAMINOPHEN 325 MG PO TABS
325.0000 mg | ORAL_TABLET | ORAL | Status: DC | PRN
  Administered 2013-07-15: 325 mg via ORAL
  Filled 2013-07-14: qty 2

## 2013-07-14 MED ORDER — ACETAMINOPHEN 325 MG RE SUPP
325.0000 mg | RECTAL | Status: DC | PRN
  Filled 2013-07-14: qty 2

## 2013-07-14 MED ORDER — ALBUMIN HUMAN 5 % IV SOLN
12.5000 g | Freq: Once | INTRAVENOUS | Status: AC
  Administered 2013-07-14: 12.5 g via INTRAVENOUS

## 2013-07-14 MED ORDER — GLYCOPYRROLATE 0.2 MG/ML IJ SOLN
INTRAMUSCULAR | Status: DC | PRN
  Administered 2013-07-14: 0.4 mg via INTRAVENOUS
  Administered 2013-07-14: 0.2 mg via INTRAVENOUS

## 2013-07-14 MED ORDER — HYDROCHLOROTHIAZIDE 12.5 MG PO CAPS
12.5000 mg | ORAL_CAPSULE | Freq: Every day | ORAL | Status: DC
  Administered 2013-07-15: 12.5 mg via ORAL
  Filled 2013-07-14 (×2): qty 1

## 2013-07-14 MED ORDER — OXYCODONE HCL 5 MG PO TABS: 5.0000 mg | ORAL_TABLET | Freq: Once | ORAL | Status: DC | PRN

## 2013-07-14 MED ORDER — DEXTROSE 5 % IV SOLN
1.5000 g | Freq: Two times a day (BID) | INTRAVENOUS | Status: AC
  Administered 2013-07-14 – 2013-07-15 (×2): 1.5 g via INTRAVENOUS
  Filled 2013-07-14 (×2): qty 1.5

## 2013-07-14 MED ORDER — SODIUM CHLORIDE 0.9 % IV SOLN: 500.0000 mL | Freq: Once | INTRAVENOUS | Status: AC | PRN

## 2013-07-14 MED ORDER — 0.9 % SODIUM CHLORIDE (POUR BTL) OPTIME
TOPICAL | Status: DC | PRN
  Administered 2013-07-14: 2000 mL

## 2013-07-14 MED ORDER — PROMETHAZINE HCL 25 MG/ML IJ SOLN
6.2500 mg | INTRAMUSCULAR | Status: DC | PRN
  Administered 2013-07-14: 12.5 mg via INTRAVENOUS

## 2013-07-14 MED ORDER — LISINOPRIL-HYDROCHLOROTHIAZIDE 20-12.5 MG PO TABS: 1.0000 | ORAL_TABLET | Freq: Every day | ORAL | Status: DC

## 2013-07-14 MED ORDER — SODIUM CHLORIDE 0.9 % IR SOLN
Status: DC | PRN
  Administered 2013-07-14: 08:00:00

## 2013-07-14 MED ORDER — PROPOFOL 10 MG/ML IV BOLUS
INTRAVENOUS | Status: DC | PRN
  Administered 2013-07-14: 20 mg via INTRAVENOUS
  Administered 2013-07-14: 150 mg via INTRAVENOUS
  Administered 2013-07-14: 20 mg via INTRAVENOUS

## 2013-07-14 MED ORDER — PROMETHAZINE HCL 25 MG/ML IJ SOLN
INTRAMUSCULAR | Status: AC
  Filled 2013-07-14: qty 1

## 2013-07-14 MED ORDER — OXYCODONE HCL 5 MG PO TABS: 5.0000 mg | ORAL_TABLET | Freq: Four times a day (QID) | ORAL | Status: DC | PRN

## 2013-07-14 MED ORDER — ARTIFICIAL TEARS OP OINT
TOPICAL_OINTMENT | OPHTHALMIC | Status: DC | PRN
  Administered 2013-07-14: 1 via OPHTHALMIC

## 2013-07-14 MED ORDER — METOCLOPRAMIDE HCL 5 MG/ML IJ SOLN
INTRAMUSCULAR | Status: AC
  Filled 2013-07-14: qty 2

## 2013-07-14 MED ORDER — PANTOPRAZOLE SODIUM 40 MG PO TBEC
40.0000 mg | DELAYED_RELEASE_TABLET | Freq: Every day | ORAL | Status: DC
  Administered 2013-07-15: 40 mg via ORAL
  Filled 2013-07-14: qty 1

## 2013-07-14 MED ORDER — LIDOCAINE HCL (CARDIAC) 20 MG/ML IV SOLN
INTRAVENOUS | Status: DC | PRN
  Administered 2013-07-14: 80 mg via INTRAVENOUS

## 2013-07-14 MED ORDER — GUAIFENESIN-DM 100-10 MG/5ML PO SYRP: 15.0000 mL | ORAL_SOLUTION | ORAL | Status: DC | PRN

## 2013-07-14 MED ORDER — DOPAMINE-DEXTROSE 3.2-5 MG/ML-% IV SOLN
3.0000 ug/kg/min | INTRAVENOUS | Status: DC
  Administered 2013-07-14: 3 ug/kg/min via INTRAVENOUS
  Filled 2013-07-14 (×2): qty 250

## 2013-07-14 MED ORDER — SITAGLIPTIN PHOSPHATE 50 MG PO TABS
50.0000 mg | ORAL_TABLET | Freq: Two times a day (BID) | ORAL | Status: DC
  Administered 2013-07-15: 50 mg via ORAL
  Filled 2013-07-14 (×4): qty 1

## 2013-07-14 MED ORDER — LISINOPRIL 20 MG PO TABS
20.0000 mg | ORAL_TABLET | Freq: Every day | ORAL | Status: DC
  Administered 2013-07-15: 20 mg via ORAL
  Filled 2013-07-14 (×2): qty 1

## 2013-07-14 MED ORDER — ALUM & MAG HYDROXIDE-SIMETH 200-200-20 MG/5ML PO SUSP: 15.0000 mL | ORAL | Status: DC | PRN

## 2013-07-14 MED ORDER — MORPHINE SULFATE 2 MG/ML IJ SOLN
2.0000 mg | INTRAMUSCULAR | Status: DC | PRN
  Filled 2013-07-14: qty 1

## 2013-07-14 MED ORDER — PHENYLEPHRINE HCL 10 MG/ML IJ SOLN
10.0000 mg | INTRAVENOUS | Status: DC | PRN
  Administered 2013-07-14: 50 ug/min via INTRAVENOUS

## 2013-07-14 SURGICAL SUPPLY — 55 items
ADH SKN CLS APL DERMABOND .7 (GAUZE/BANDAGES/DRESSINGS) ×1
CANISTER SUCTION 2500CC (MISCELLANEOUS) ×2 IMPLANT
CANNULA VESSEL 3MM 2 BLNT TIP (CANNULA) ×2 IMPLANT
CATH ROBINSON RED A/P 18FR (CATHETERS) ×2 IMPLANT
CHLORAPREP W/TINT 10.5 ML (MISCELLANEOUS) ×1 IMPLANT
CLIP TI MEDIUM 6 (CLIP) ×2 IMPLANT
CLIP TI WIDE RED SMALL 6 (CLIP) ×2 IMPLANT
COVER SURGICAL LIGHT HANDLE (MISCELLANEOUS) ×2 IMPLANT
CRADLE DONUT ADULT HEAD (MISCELLANEOUS) ×2 IMPLANT
DECANTER SPIKE VIAL GLASS SM (MISCELLANEOUS) IMPLANT
DERMABOND ADVANCED (GAUZE/BANDAGES/DRESSINGS) ×1
DERMABOND ADVANCED .7 DNX12 (GAUZE/BANDAGES/DRESSINGS) ×1 IMPLANT
DRAIN HEMOVAC 1/8 X 5 (WOUND CARE) IMPLANT
DRAPE INCISE IOBAN 66X45 STRL (DRAPES) ×1 IMPLANT
DRAPE ORTHO SPLIT 77X108 STRL (DRAPES) ×2
DRAPE SURG ORHT 6 SPLT 77X108 (DRAPES) IMPLANT
DRAPE WARM FLUID 44X44 (DRAPE) ×2 IMPLANT
ELECT REM PT RETURN 9FT ADLT (ELECTROSURGICAL) ×2
ELECTRODE REM PT RTRN 9FT ADLT (ELECTROSURGICAL) ×1 IMPLANT
EVACUATOR SILICONE 100CC (DRAIN) IMPLANT
GEL ULTRASOUND 20GR AQUASONIC (MISCELLANEOUS) IMPLANT
GLOVE BIO SURGEON STRL SZ 6.5 (GLOVE) ×2 IMPLANT
GLOVE BIO SURGEON STRL SZ7.5 (GLOVE) ×2 IMPLANT
GLOVE BIOGEL PI IND STRL 6.5 (GLOVE) IMPLANT
GLOVE BIOGEL PI IND STRL 7.0 (GLOVE) IMPLANT
GLOVE BIOGEL PI INDICATOR 6.5 (GLOVE) ×1
GLOVE BIOGEL PI INDICATOR 7.0 (GLOVE) ×1
GOWN PREVENTION PLUS XLARGE (GOWN DISPOSABLE) ×2 IMPLANT
GOWN STRL NON-REIN LRG LVL3 (GOWN DISPOSABLE) ×4 IMPLANT
GOWN STRL REIN XL XLG (GOWN DISPOSABLE) ×1 IMPLANT
KIT BASIN OR (CUSTOM PROCEDURE TRAY) ×2 IMPLANT
KIT ROOM TURNOVER OR (KITS) ×2 IMPLANT
LOOP VESSEL MINI RED (MISCELLANEOUS) IMPLANT
NDL HYPO 25GX1X1/2 BEV (NEEDLE) IMPLANT
NEEDLE HYPO 25GX1X1/2 BEV (NEEDLE) IMPLANT
NS IRRIG 1000ML POUR BTL (IV SOLUTION) ×4 IMPLANT
PACK CAROTID (CUSTOM PROCEDURE TRAY) ×2 IMPLANT
PACK UNIVERSAL I (CUSTOM PROCEDURE TRAY) ×1 IMPLANT
PAD ARMBOARD 7.5X6 YLW CONV (MISCELLANEOUS) ×4 IMPLANT
PATCH HEMASHIELD 8X75 (Vascular Products) ×1 IMPLANT
SHUNT CAROTID BYPASS 10 (VASCULAR PRODUCTS) ×1 IMPLANT
SHUNT CAROTID BYPASS 12FRX15.5 (VASCULAR PRODUCTS) IMPLANT
SPONGE SURGIFOAM ABS GEL 100 (HEMOSTASIS) IMPLANT
SUT ETHILON 3 0 PS 1 (SUTURE) IMPLANT
SUT PROLENE 6 0 CC (SUTURE) ×4 IMPLANT
SUT PROLENE 7 0 BV 1 (SUTURE) IMPLANT
SUT SILK 3 0 TIES 17X18 (SUTURE)
SUT SILK 3-0 18XBRD TIE BLK (SUTURE) IMPLANT
SUT VIC AB 3-0 SH 27 (SUTURE) ×2
SUT VIC AB 3-0 SH 27X BRD (SUTURE) ×1 IMPLANT
SUT VICRYL 4-0 PS2 18IN ABS (SUTURE) ×2 IMPLANT
SYR CONTROL 10ML LL (SYRINGE) IMPLANT
TOWEL OR 17X24 6PK STRL BLUE (TOWEL DISPOSABLE) ×3 IMPLANT
TOWEL OR 17X26 10 PK STRL BLUE (TOWEL DISPOSABLE) ×2 IMPLANT
WATER STERILE IRR 1000ML POUR (IV SOLUTION) ×2 IMPLANT

## 2013-07-14 NOTE — Anesthesia Procedure Notes (Signed)
Procedure Name: Intubation Date/Time: 07/14/2013 7:40 AM Performed by: Lovie Chol Pre-anesthesia Checklist: Patient identified, Emergency Drugs available, Suction available, Patient being monitored and Timeout performed Patient Re-evaluated:Patient Re-evaluated prior to inductionOxygen Delivery Method: Circle system utilized Preoxygenation: Pre-oxygenation with 100% oxygen Intubation Type: IV induction Ventilation: Mask ventilation without difficulty Laryngoscope Size: Miller and 2 Grade View: Grade I Tube type: Oral Tube size: 7.0 mm Number of attempts: 1 Airway Equipment and Method: Stylet Placement Confirmation: ETT inserted through vocal cords under direct vision,  positive ETCO2,  CO2 detector and breath sounds checked- equal and bilateral Secured at: 22 cm Tube secured with: Tape Dental Injury: Teeth and Oropharynx as per pre-operative assessment

## 2013-07-14 NOTE — Transfer of Care (Signed)
Immediate Anesthesia Transfer of Care Note  Patient: [name]  Procedure(s) Performed: Procedure(s): ENDARTERECTOMY CAROTID-LEFT (Left) LEFT CAROTID ARTERY PATCH ANGIOPLASTY (Left)  Patient Location: PACU  Anesthesia Type:General  Level of Consciousness: awake, oriented and patient cooperative  Airway & Oxygen Therapy: Patient Spontanous Breathing and Patient connected to nasal cannula oxygen  Post-op Assessment: Report given to PACU RN and Post -op Vital signs reviewed and stable  Post vital signs: Reviewed  Complications: No apparent anesthesia complications

## 2013-07-14 NOTE — Progress Notes (Signed)
Utilization review completed.  

## 2013-07-14 NOTE — Op Note (Signed)
Procedure Left carotid endarterectomy  Preoperative diagnosis: High-grade symptomatic left internal carotid artery stenosis  Postoperative diagnosis: Same  Anesthesia General  Asst.: Doreatha Massed, PAC  Operative findings: #1 greater than 80% left internal carotid stenosis with exophytic calcified plaque                                #2 Dacron patch, 10 Fr shunt  Operative details: After obtaining informed consent, the patient was taken to the operating room. The patient was placed in a supine position on the operating room table. After induction of general anesthesia and endotracheal intubation a Foley catheter was placed. Next the patient's entire neck and chest was prepped and draped in the usual sterile fashion. An oblique incision was made on the left aspect of the patient's neck anterior to the border the left sternocleidomastoid muscle. The incision was carried into the subcutaneous tissues and through the platysma. The sternocleidomastoid muscle was identified and reflected laterally. The omohyoid muscle was identified and this was reflected inferiorly. The common carotid artery was then found at the base of the incision this was dissected free circumferentially. It was soft on palpation.  The vagus nerve was identified and protected. Dissection was then carried up to the level carotid bifurcation. The Ansa cervicalis was identified and traced up to its insertion into the hypoglossal nerve.  The hyperglossal nerve was well above the primary area of dissection. The internal carotid artery was dissected free circumferentially just below the level of the hypoglossal nerve and it was soft in character at this location and above any palpable disease. A vessel loop was placed around this. Next the external carotid and superior thyroid arteries were dissected free circumferentially and vessel loops were placed around these. The patient was given 7000 units of intravenous heparin.  After 2 minutes of  circulation time and raising the mean arterial pressure to 85 mm mercury, the distal internal carotid artery was controlled with small bulldog clamp. The external carotid and superior thyroid arteries were controlled with vessel loops. The common carotid artery was controlled with a peripheral DeBakey clamp. A longitudinal opening was made in the common carotid artery just below the bifurcation. The arteriotomy was extended distally up into the internal carotid with Potts scissors. There was a large exophytic calcified plaque with greater than 80% stenosis in the internal carotid.  The arteriotomy was extended past the level of stenosis. Next a 10 Fr shunt was brought onto the field and threaded into the distal internal carotid and allowed to backbleed thoroughly.  The shunt was then placed into the common carotid and secured with a Rummel tourniquet.  The shunt was inspected and found to be air free.  The shunt was unclamped and flow restored to the brain after about 2 minutes.   Attention was then turned to the common carotid artery once again. A suitable endarterectomy plane was obtained and endarterectomy was begun in the common carotid artery and a reasonable proximal endpoint was obtained. An eversion endarterectomy was performed on the external carotid artery and a good endpoint was obtained. The plaque was then elevated in the internal carotid artery and a nice feathered distal endpoint was also obtained. The plaque was passed off the table. All loose debris was then removed from the carotid bed and everything was thoroughly irrigated with heparinized saline. A Dacron patch was then brought on to the operative field and this was sewn on as  a patch angioplasty using a running 6-0 Prolene suture. Prior to completion of the anastomosis the shunt was removed after clamping and pulled down out of the internal carotid.  The internal carotid artery was thoroughly backbled. This was then controlled again with a  small bulldog clamp. The shunt was removed from the common carotid artery and the common carotid was thoroughly flushed forward. This was then controlled with a peripheral debakey clamp.  The external carotid was also thoroughly backbled.  The remainder of the patch was completed and the anastomosis was secured. Flow was then restored first retrograde from the external carotid into the carotid bed then antegrade from the common carotid to the external carotid artery and after approximately 5 cardiac cycles to the internal carotid artery. Doppler was used to evaluate the external/internal and common carotid arteries and these all had good Doppler flow. Hemostasis was obtained with 1 additional repair suture at the distal end of the patch. The patient was also given 70 mg of Protamine.    After hemostasis was obtained, the platysma muscle was reapproximated using a running 3-0 Vicryl suture. The skin was closed with 4 0 Vicryl subcuticular stitch.  The patient was awakened in the operating room and was moving upper and lower extremities symmetrically and following commands.  The patient was stable on arrival to the PACU.  Fabienne Bruns, MD [facility] Office: [phone] Pager: [phone]

## 2013-07-14 NOTE — Interval H&P Note (Signed)
History and Physical Interval Note:  07/14/2013 7:23 AM  [name]  has presented today for surgery, with the diagnosis of Left Internal Carotid Artery Stenosis with CVA  The various methods of treatment have been discussed with the patient and family. After consideration of risks, benefits and other options for treatment, the patient has consented to  Procedure(s): ENDARTERECTOMY CAROTID-LEFT (Left) as a surgical intervention .  The patient's history has been reviewed, patient examined, no change in status, stable for surgery.  I have reviewed the patient's chart and labs.  Questions were answered to the patient's satisfaction.     FIELDS,CHARLES E

## 2013-07-14 NOTE — Anesthesia Preprocedure Evaluation (Addendum)
Anesthesia Evaluation  Patient identified by MRN, date of birth, ID band Patient awake    Reviewed: Allergy & Precautions, H&P , Patient's Chart, lab work & pertinent test results  History of Anesthesia Complications (+) PONV and history of anesthetic complications  Airway Mallampati: II TM Distance: >3 FB Neck ROM: full    Dental  (+) Poor Dentition, Missing and Dental Advisory Given   Pulmonary Current Smoker,  breath sounds clear to auscultation        Cardiovascular hypertension, Pt. on medications + Peripheral Vascular Disease Rhythm:Regular     Neuro/Psych Studdering CVA, Residual Symptoms    GI/Hepatic negative GI ROS, Neg liver ROS,   Endo/Other  diabetes, Type 2, Oral Hypoglycemic Agents  Renal/GU negative Renal ROS     Musculoskeletal   Abdominal   Peds  Hematology   Anesthesia Other Findings   Reproductive/Obstetrics negative OB ROS                          Anesthesia Physical Anesthesia Plan  ASA: III  Anesthesia Plan: General   Post-op Pain Management:    Induction: Intravenous  Airway Management Planned: Oral ETT  Additional Equipment: Arterial line  Intra-op Plan:   Post-operative Plan: Extubation in OR  Informed Consent: I have reviewed the patients History and Physical, chart, labs and discussed the procedure including the risks, benefits and alternatives for the proposed anesthesia with the patient or authorized representative who has indicated his/her understanding and acceptance.     Plan Discussed with: CRNA, Anesthesiologist and Surgeon  Anesthesia Plan Comments:         Anesthesia Quick Evaluation

## 2013-07-14 NOTE — H&P (View-Only) (Signed)
[facility] [place] Consultation  Requesting: Dr Vann Reason for consult: Stroke with left carotid stenosis History of Present Illness:  Patient is a 72 y.o. year old female who presents for evaluation of left carotid stenosis with stroke.  Pt has had difficulty finding words since 06/26/13.  She states it has improved some but not resolved.  She denies any numbness or weakness of face or extremities.  She had a TIA 5/14 which caused her to have right hand numbness.  Carotid duplex at that time showed 60-80% stenosis of the left carotid.  The left ICA stenosis has now progressed to > 80%.  She denies shortness of breath or chest pain.  She currently smokes 1/2 ppd cigarettes.  Greater than 3 min spent today regarding smoking cessation.  Other medical problems include hypertension, diabetes, hyperlipidemia all of which are currently stable.  She is on aspirin and a statin.  Past Medical History  Diagnosis Date  . Hypertension   . Diabetes mellitus   . Neuropathy, diabetic   . Dyslipidemia   . Smoker     Past Surgical History  Procedure Laterality Date  . Cholecystectomy    Left knee surgery to repair patella  Social History History  Substance Use Topics  . Smoking status: Current Every Day Smoker  . Smokeless tobacco: Not on file  . Alcohol Use: No    Family History History reviewed. No pertinent family history.  Allergies  Allergies  Allergen Reactions  . Codeine Nausea And Vomiting     Current Facility-Administered Medications  Medication Dose Route Frequency Provider Last Rate Last Dose  . 0.9 %  sodium chloride infusion   Intravenous Continuous Belkys A Regalado, MD 75 mL/hr at 06/27/13 1755    . [START ON 06/30/2013] aspirin EC tablet 325 mg  325 mg Oral Daily Sharon L Biby, NP      . atorvastatin (LIPITOR) tablet 40 mg  40 mg Oral q1800 Belkys A Regalado, MD   40 mg at 06/27/13 1808  . enoxaparin (LOVENOX) injection 40 mg  40 mg Subcutaneous  Q24H Belkys A Regalado, MD   40 mg at 06/28/13 2115  . insulin aspart (novoLOG) injection 0-9 Units  0-9 Units Subcutaneous TID WC Belkys A Regalado, MD   3 Units at 06/29/13 1209  . senna-docusate (Senokot-S) tablet 1 tablet  1 tablet Oral QHS PRN Belkys A Regalado, MD      . verapamil (CALAN-SR) CR tablet 120 mg  120 mg Oral Daily Belkys A Regalado, MD   120 mg at 06/29/13 1048    ROS:   General:  No weight loss, Fever, chills  HEENT: No recent headaches, no nasal bleeding, no visual changes, no sore throat  Neurologic: No dizziness, blackouts, seizures. + recent symptoms of stroke or mini- stroke. No recent episodes of slurred speech, or temporary blindness.  +aphasia  Cardiac: No recent episodes of chest pain/pressure, no shortness of breath at rest.  No shortness of breath with exertion.  Denies history of atrial fibrillation or irregular heartbeat  Vascular: No history of rest pain in feet.  No history of claudication.  No history of non-healing ulcer, No history of DVT   Pulmonary: No home oxygen, no productive cough, no hemoptysis,  No asthma or wheezing  Musculoskeletal:  [ ] Arthritis, [ ] Low back pain,  [ ] Joint pain  Hematologic:No history of hypercoagulable state.  No history of easy bleeding.  No history of anemia  Gastrointestinal: No hematochezia   or melena,  No gastroesophageal reflux, no trouble swallowing  Urinary: [ ] chronic Kidney disease, [ ] on HD - [ ] MWF or [ ] TTHS, [ ] Burning with urination, [ ] Frequent urination, [ ] Difficulty urinating;   Skin: No rashes  Psychological: No history of anxiety,  No history of depression   Physical Examination  Filed Vitals:   06/28/13 2210 06/29/13 0115 06/29/13 0525 06/29/13 1027  BP: 128/52 154/72 153/66 157/60  Pulse: 66 64 71 62  Temp: 97.8 F (36.6 C) 97.9 F (36.6 C) 97.9 F (36.6 C) 97.9 F (36.6 C)  TempSrc: Oral Oral Oral Oral  Resp: 18 18 18 18  Height:      Weight:      SpO2: 97% 100% 99% 98%     Body mass index is 23.9 kg/(m^2).  General:  Alert and oriented, no acute distress HEENT: Normal Neck: No bruit or JVD Pulmonary: Clear to auscultation bilaterally Cardiac: Regular Rate and Rhythm without murmur Abdomen: Soft, non-tender, non-distended, no mass Skin: No rash Extremity Pulses:  2+ radial, brachial, femoral, dorsalis pedis right side, absent left DP pulse Musculoskeletal: No deformity or edema  Neurologic: Upper and lower extremity motor 5/5 and symmetric, no facial asymmetry, difficulty gathering words but no slurring  DATA:  Carotid duplex left ICA 613/163, right 83/24 <40%, head CT no bleed, head MRI left posterior frontal acute infarct, chronic right external capsule   ASSESSMENT:  Symptomatic left ICA stenosis   PLAN:  Continue aspirin.  Left CEA Tuesday December 30th.  Pt may be discharged from my standpoint today.  Melessia Kaus, MD [facility] [place] Office: [phone] Pager: [phone]   For VQI Use Only     PRE-ADM LIVING: Home  AMB STATUS: Ambulatory  CAD Sx: None  PRIOR CHF: None  STRESS TEST: [x ] No, [ ] Normal, [ ] + ischemia, [ ] + MI, [ ] Both  

## 2013-07-14 NOTE — Telephone Encounter (Addendum)
Message copied by Fredrich Birks on Tue Jul 14, 2013 11:05 AM ------      Message from: Dara Lords      Created: Tue Jul 14, 2013 10:12 AM       S/p left CEA 07/14/13.  F/u with Dr. Darrick Penna in 2 weeks.                  Thanks,      Lelon Mast ------  07/14/13: lm with pts spouse and mailed letter, dpm

## 2013-07-14 NOTE — Progress Notes (Signed)
Pt. Stated she was nauseated due to taking medications this am. Stated Dr. Darrick Penna wanted her to take aspirin and the blood pressure medications. States the nausea is easing off now.

## 2013-07-14 NOTE — Progress Notes (Signed)
Pt A-line and cuff pressure began reading systolic b/p in the upper 80's despite fluids bolus. Dopamine gtt started. Pt also had episode of nausea and vomiting at this time. O2 saturations dropped post emesis into the upper 80's on room air, lungs sound clear, pt placed on 4LNC. Will continue to monitor.

## 2013-07-14 NOTE — Anesthesia Postprocedure Evaluation (Signed)
Anesthesia Post Note  Patient: [name]  Procedure(s) Performed: Procedure(s) (LRB): ENDARTERECTOMY CAROTID-LEFT (Left) LEFT CAROTID ARTERY PATCH ANGIOPLASTY (Left)  Anesthesia type: General  Patient location: PACU  Post pain: Pain level controlled and Adequate analgesia  Post assessment: Post-op Vital signs reviewed, Patient's Cardiovascular Status Stable, Respiratory Function Stable, Patent Airway and Pain level controlled  Last Vitals:  Filed Vitals:   07/14/13 1145  BP:   Pulse: 58  Temp:   Resp: 23    Post vital signs: Reviewed and stable  Level of consciousness: awake, alert  and oriented  Complications: PONV difficult to control.

## 2013-07-14 NOTE — Preoperative (Signed)
Beta Blockers   Reason not to administer Beta Blockers:Not Applicable 

## 2013-07-14 NOTE — OR Nursing (Signed)
@  1045 had 4 episodes nausea and vomiing of 60 mls green bilious emesis/ had 2 doses zofran per crna--spoke with dr Chaney Malling, orders rec'd/given promethazine

## 2013-07-15 ENCOUNTER — Encounter (HOSPITAL_COMMUNITY): Payer: Self-pay | Admitting: Vascular Surgery

## 2013-07-15 LAB — GLUCOSE, CAPILLARY: Glucose-Capillary: 164 mg/dL — ABNORMAL HIGH (ref 70–99)

## 2013-07-15 LAB — CBC
HCT: 32.4 % — ABNORMAL LOW (ref 36.0–46.0)
Hemoglobin: 10.8 g/dL — ABNORMAL LOW (ref 12.0–15.0)
MCV: 89.5 fL (ref 78.0–100.0)
Platelets: 383 10*3/uL (ref 150–400)
RBC: 3.62 MIL/uL — ABNORMAL LOW (ref 3.87–5.11)
WBC: 19.2 10*3/uL — ABNORMAL HIGH (ref 4.0–10.5)

## 2013-07-15 LAB — BASIC METABOLIC PANEL
BUN: 13 mg/dL (ref 6–23)
CO2: 29 mEq/L (ref 19–32)
Chloride: 105 mEq/L (ref 96–112)
Creatinine, Ser: 0.73 mg/dL (ref 0.50–1.10)
Potassium: 4.1 mEq/L (ref 3.7–5.3)
Sodium: 144 mEq/L (ref 137–147)

## 2013-07-15 MED ORDER — TRAMADOL HCL 50 MG PO TABS: 50.0000 mg | ORAL_TABLET | Freq: Four times a day (QID) | ORAL | Status: DC | PRN

## 2013-07-15 NOTE — Progress Notes (Signed)
Discussed discharge instructions and medications with patient and family. Both verbalized understanding with all questions answered. VSS. Pt discharged home with family.  Kinzie Wickes,RN  

## 2013-07-15 NOTE — Discharge Summary (Signed)
[facility] Discharge Summary  [name] 1940-08-20 72 y.o. female  [id number]  Admission Date: 07/14/2013  Discharge Date: 07/15/13  Physician: Sherren Kerns, MD  Admission Diagnosis: Left Internal Carotid Artery Stenosis with CVA LEFT ICA STENOSIS WITH SYMPTOMS   HPI:   This is a 72 y.o. female who presents for evaluation of left carotid stenosis with stroke. Pt has had difficulty finding words since 06/26/13. She states it has improved some but not resolved. She denies any numbness or weakness of face or extremities. She had a TIA 5/14 which caused her to have right hand numbness. Carotid duplex at that time showed 60-80% stenosis of the left carotid. The left ICA stenosis has now progressed to > 80%. She denies shortness of breath or chest pain. She currently smokes 1/2 ppd cigarettes. Greater than 3 min spent today regarding smoking cessation. Other medical problems include hypertension, diabetes, hyperlipidemia all of which are currently stable. She is on aspirin and a statin.   Hospital Course:  The patient was admitted to the hospital and taken to the operating room on 07/14/2013 and underwent left carotid endarterectomy.  The pt tolerated the procedure well and was transported to the PACU in good condition.  She did have nausea and vomiting in the recovery room, but this has resolved.   By POD 1, the pt neuro status is in tact.  She is having no difficulty swallowing and is ambulating without difficulty.  The remainder of the hospital course consisted of increasing mobilization and increasing intake of solids without difficulty.    Recent Labs  07/15/13 0426  NA 144  K 4.1  CL 105  CO2 29  GLUCOSE 185*  BUN 13  CALCIUM 8.3*    Recent Labs  07/14/13 1430 07/15/13 0426  WBC 23.5* 19.2*  HGB 10.5* 10.8*  HCT 31.5* 32.4*  PLT 402* 383   No results found for this basename: INR,  in the last 72 hours  Discharge Instructions:   The  patient is discharged to home with extensive instructions on wound care and progressive ambulation.  They are instructed not to drive or perform any heavy lifting until returning to see the physician in his office.  Discharge Orders   Future Appointments Provider Department Dept Phone   07/20/2013 10:30 AM Jannifer Hick [facility] [phone]   07/22/2013 10:15 AM Jean Rosenthal, CCC-SLP [place] [facility] [phone]   07/27/2013 10:30 AM Jannifer Hick [facility] [phone]   07/29/2013 9:30 AM Jean Rosenthal, CCC-SLP [place] [facility] [phone]   07/30/2013 1:30 PM Sherren Kerns, MD [facility] [phone]   08/03/2013 10:30 AM Dorene Ar, CCC-SLP [place] [facility] [phone]   08/05/2013 10:15 AM Jean Rosenthal, CCC-SLP [place] [facility] [phone]   08/10/2013 10:30 AM Ellan Lambert [facility] [phone]   08/12/2013 10:15 AM Jean Rosenthal, CCC-SLP [place] [facility] [phone]   09/08/2013 2:15 PM Micki Riley, MD [facility] [phone]   09/10/2013 9:15 AM Ronnald Nian, MD [facility] [phone]   Future Orders Complete By Expires   Call MD for:  redness, tenderness, or signs of infection (pain, swelling, bleeding, redness, odor or green/yellow discharge around incision site)  As directed    Call MD for:  severe or increased pain, loss or decreased feeling  in affected limb(s)  As directed  Call MD for:  temperature >100.5  As directed    CAROTID Sugery: Call MD for difficulty swallowing or speaking; weakness in arms or legs that is a new symtom; severe headache.  If you have increased swelling in the neck and/or  are having difficulty breathing, CALL 911  As directed    Discharge wound  care:  As directed    Comments:     Shower daily with soap and water starting 07/16/13   Driving Restrictions  As directed    Comments:     No driving for 2 weeks   Lifting restrictions  As directed    Comments:     No lifting for 2 weeks   Resume previous diet  As directed       Discharge Diagnosis:  Left Internal Carotid Artery Stenosis with CVA LEFT ICA STENOSIS WITH SYMPTOMS  Secondary Diagnosis: Patient Active Problem List   Diagnosis Date Noted  . Carotid artery stenosis 07/14/2013  . Aphasia due to recent cerebral infarction 07/06/2013  . Occlusion and stenosis of carotid artery with cerebral infarction 06/29/2013  . Type II or unspecified type diabetes mellitus with neurological manifestations, not stated as uncontrolled(250.60) 06/29/2013  . Stroke 06/27/2013  . Leukocytosis 11/17/2012  . TIA (transient ischemic attack) 11/16/2012  . HTN (hypertension) 01/04/2011  . Dyslipidemia 01/04/2011  . Former smoker 01/04/2011   Past Medical History  Diagnosis Date  . Neuropathy, diabetic   . Dyslipidemia     takes Crestor daily  . Smoker   . Hypertension     takes Prinzide and Verapamil daily  . Diabetes mellitus     takes Janumet daily  . PONV (postoperative nausea and vomiting)   . Impaired speech     from stroke  . Vertigo     but doesn't take any meds  . Stroke 06/25/13      Medication List         aspirin 325 MG EC tablet  Take 1 tablet (325 mg total) by mouth daily.     lisinopril-hydrochlorothiazide 20-12.5 MG per tablet  Commonly known as:  PRINZIDE,ZESTORETIC  Take 1 tablet by mouth daily.     oxyCODONE 5 MG immediate release tablet  Commonly known as:  ROXICODONE  Take 1 tablet (5 mg total) by mouth every 6 (six) hours as needed for severe pain.     rosuvastatin 20 MG tablet  Commonly known as:  CRESTOR  Take 1 tablet (20 mg total) by mouth daily.     sitaGLIPtin-metformin 50-1000 MG per tablet  Commonly known as:  JANUMET  Take 1 tablet  by mouth 2 (two) times daily with a meal.     verapamil 240 MG CR tablet  Commonly known as:  CALAN-SR  Take 240 mg by mouth daily.        Roxicodone #30 No Refill  Disposition: home  Patient's condition: is Good  Follow up: 1. Dr.  Darrick Penna in 2 weeks.   Doreatha Massed, PA-C [facility] [phone]  --- For [facility] use --- Instructions: Press F2 to tab through selections.  Delete question if not applicable.   Modified Rankin score at D/C (0-6): 1  IV medication needed for:  1. Hypertension:  2. Hypotension: Yes  Post-op Complications: No  1. Post-op CVA or TIA: No  If yes: Event classification (right eye, left eye, right cortical, left cortical, verterobasilar, other): n/a  If yes: Timing of event (intra-op, <6 hrs post-op, >=6 hrs post-op, unknown): n/a  2. CN injury: No  If yes: CN n/a injuried   3. Myocardial infarction: No  If yes: Dx by (EKG or clinical, Troponin): n/a  4.  CHF: No  5.  Dysrhythmia (new): No  6. Wound infection: No  7. Reperfusion symptoms: No  8. Return to OR: No  If yes: return to OR for (bleeding, neurologic, other CEA incision, other): n/a  Discharge medications: Statin use:  Yes If No: [ ]  For Medical reasons, [ ]  Non-compliant, [ ]  Not-indicated ASA use:  Yes  If No: [ ]  For Medical reasons, [ ]  Non-compliant, [ ]  Not-indicated Beta blocker use:  No If No: [ ]  For Medical reasons, [ ]  Non-compliant, [ ]  Not-indicated ACE-Inhibitor use:  Yes If No: [ ]  For Medical reasons, [ ]  Non-compliant, [ ]  Not-indicated P2Y12 Antagonist use: no, [ ]  Plavix, [ ]  Plasugrel, [ ]  Ticlopinine, [ ]  Ticagrelor, [ ]  Other, [ ]  No for medical reason, [ ]  Non-compliant, [ ]  Not-indicated Anti-coagulant use:  no, [ ]  Warfarin, [ ]  Rivaroxaban, [ ]  Dabigatran, [ ]  Other, [ ]  No for medical reason, [ ]  Non-compliant, [ ]  Not-indicated

## 2013-07-15 NOTE — Progress Notes (Signed)
[facility] Progress Note  07/15/2013 7:33 AM 1 Day Post-Op  Subjective:  No complaints this am  Tm 99.9 now afebrile 100% 3LO2NC  Dopamine off at Midnight  Filed Vitals:   07/15/13 0400  BP: 119/51  Pulse: 57  Temp: 98.7 F (37.1 C)  Resp: 20     Physical Exam: Neuro:  In tact Incision:  C/d/i  CBC    Component Value Date/Time   WBC 19.2* 07/15/2013 0426   RBC 3.62* 07/15/2013 0426   HGB 10.8* 07/15/2013 0426   HCT 32.4* 07/15/2013 0426   PLT 383 07/15/2013 0426   MCV 89.5 07/15/2013 0426   MCH 29.8 07/15/2013 0426   MCHC 33.3 07/15/2013 0426   RDW 14.5 07/15/2013 0426   LYMPHSABS 3.6 07/07/2013 1503   MONOABS 0.8 07/07/2013 1503   EOSABS 0.5 07/07/2013 1503   BASOSABS 0.1 07/07/2013 1503    BMET    Component Value Date/Time   NA 144 07/15/2013 0426   K 4.1 07/15/2013 0426   CL 105 07/15/2013 0426   CO2 29 07/15/2013 0426   GLUCOSE 185* 07/15/2013 0426   BUN 13 07/15/2013 0426   CREATININE 0.73 07/15/2013 0426   CREATININE 0.84 10/09/2012 1009   CALCIUM 8.3* 07/15/2013 0426   GFRNONAA 83* 07/15/2013 0426   GFRAA >90 07/15/2013 0426     Intake/Output Summary (Last 24 hours) at 07/15/13 0733 Last data filed at 07/15/13 0600  Gross per 24 hour  Intake 3877.8 ml  Output   1450 ml  Net 2427.8 ml      Assessment/Plan:  This is a 72 y.o. female who is s/p left CEA 1 Day Post-Op  -pt is doing well this am. -pt neuro exam is in tact -pt has ambulated -pt has voided  -pt doing well this am -her WBC still elevated, but is decreased from yesterday -pt did require dopamine for BP support, but this was weaned successfully at [place] and she is tolerating well. -Wean off O2 and discharge later this morning after breakfast.   Doreatha Massed, PA-C [facility] [phone]

## 2013-07-20 ENCOUNTER — Ambulatory Visit (HOSPITAL_COMMUNITY)
Admission: RE | Admit: 2013-07-20 | Discharge: 2013-07-20 | Disposition: A | Discharge status: Home / Self Care | Payer: Medicare Other | Source: Ambulatory Visit | Attending: Family Medicine | Admitting: Family Medicine

## 2013-07-20 DIAGNOSIS — IMO0001 Reserved for inherently not codable concepts without codable children: Secondary | ICD-10-CM | POA: Insufficient documentation

## 2013-07-20 DIAGNOSIS — I69920 Aphasia following unspecified cerebrovascular disease: Secondary | ICD-10-CM | POA: Insufficient documentation

## 2013-07-20 DIAGNOSIS — I69320 Aphasia following cerebral infarction: Secondary | ICD-10-CM

## 2013-07-20 DIAGNOSIS — I10 Essential (primary) hypertension: Secondary | ICD-10-CM | POA: Insufficient documentation

## 2013-07-20 DIAGNOSIS — E119 Type 2 diabetes mellitus without complications: Secondary | ICD-10-CM | POA: Insufficient documentation

## 2013-07-20 NOTE — Progress Notes (Signed)
Speech Language Pathology Treatment Patient Details  Name: NIALA STCHARLES MRN: [id number] Date of Birth: 1940/09/23  Today's Date: 07/20/2013 Time: 1030-1115 SLP Time Calculation (min): 62 min  Authorization: [employer]  Authorization Time Period: 07/06/2013- 08/04/2013  Authorization Visit#:   2 of 8   HPI:   Pt with mild expressive aphasia and reading comprehension deficits s/p CVA.   Treatment  Aphasia Therapy Patient/Family Education Home Exercise Program  SLP Goals  Home Exercise SLP Goal: Patient will Perform Home Exercise Program: with supervision, verbal cues required/provided SLP Goal: Perform Home Exercise Program - Progress: Progressing toward goal SLP Short Term Goals SLP Short Term Goal 1: Pt will increase abstract naming tasks to 5+ words per category with mi/mod cues (baseline is 2). SLP Short Term Goal 1 - Progress: Progressing toward goal SLP Short Term Goal 2: Pt will verbally describe pictures in complete sentences with min cues for fluency and content. SLP Short Term Goal 2 - Progress: Progressing toward goal SLP Short Term Goal 3: Pt will verbally retell simple paragraph- length stories with 5+ details with min cues and use of srategies. SLP Short Term Goal 3 - Progress: Progressing toward goal SLP Short Term Goal 4: Pt will increase reading comprehension of short paragraphs to 90% acc via multiple choice responses when provided min cues. SLP Short Term Goal 4 - Progress: Progressing toward goal SLP Long Term Goals SLP Long Term Goal 1: Increase verbal expression to [facility] for moderately complex information when communicating with friends and family. SLP Long Term Goal 1 - Progress: Progressing toward goal  Assessment/Plan  Patient Active Problem List   Diagnosis Date Noted  . Carotid artery stenosis 07/14/2013  . Aphasia due to recent cerebral infarction 07/06/2013  . Occlusion and stenosis of carotid artery with cerebral infarction  06/29/2013  . Type II or unspecified type diabetes mellitus with neurological manifestations, not stated as uncontrolled(250.60) 06/29/2013  . Stroke 06/27/2013  . Leukocytosis 11/17/2012  . TIA (transient ischemic attack) 11/16/2012  . HTN (hypertension) 01/04/2011  . Dyslipidemia 01/04/2011  . Former smoker 01/04/2011   SLP - End of Session Activity Tolerance: Patient tolerated treatment well General Behavior During Therapy: WFL for tasks assessed/performed  SLP Assessment/Plan  Clinical Impression Statement: Mrs. Perryman had her CEA procedure on 12/30 and she reports that she is recovering well and glad to have it behind her. She forgot her glasses today, but was able to read short paragraphs with min cues for 100% acc via multiple choice responses. Pt had episodes of dysnomia in spontaneous conversation but did better in structured word-finding tasks. She was encouraged to utilize compensatory strategies for word-finding (specifically state the function). Pt provided with folder with homework and schedule. She was reminded to bring her glasses for next session. Continue with plan of care with focus on word-finding strategies (needs list of strategies).  Speech Therapy Frequency: min 2x/week Duration:  (3 weeks) Potential to Achieve Goals: Good Potential Considerations:  (question motivation level for therapy)  GN   Thank you,  Genene Churn, [place]  [place] 07/20/2013, 11:32 AM

## 2013-07-22 ENCOUNTER — Ambulatory Visit (HOSPITAL_COMMUNITY)
Admission: RE | Admit: 2013-07-22 | Discharge: 2013-07-22 | Disposition: A | Discharge status: Home / Self Care | Payer: Medicare Other | Source: Ambulatory Visit | Attending: Orthopedic Surgery | Admitting: Orthopedic Surgery

## 2013-07-22 NOTE — Progress Notes (Signed)
Speech Language Pathology Treatment Patient Details  Name: [name] MRN: [id number] Date of Birth: 1941/02/13  Today's Date: 07/22/2013 Time: 1015-1100 SLP Time Calculation (min): 45 min  Authorization: [employer]  Authorization Time Period: 07/06/2013- 08/04/2013  Authorization Visit#:  3 of 8   Treatment  Aphasia Therapy Patient/Family Education Home Exercise Program  SLP Goals  Home Exercise SLP Goal: Patient will Perform Home Exercise Program: with supervision, verbal cues required/provided SLP Goal: Perform Home Exercise Program - Progress: Progressing toward goal SLP Short Term Goals SLP Short Term Goal 1: Pt will increase abstract naming tasks to 5+ words per category with mi/mod cues (baseline is 2). SLP Short Term Goal 1 - Progress: Progressing toward goal SLP Short Term Goal 2: Pt will verbally describe pictures in complete sentences with min cues for fluency and content. SLP Short Term Goal 2 - Progress: Progressing toward goal SLP Short Term Goal 3: Pt will verbally retell simple paragraph- length stories with 5+ details with min cues and use of srategies. SLP Short Term Goal 3 - Progress: Progressing toward goal SLP Short Term Goal 4: Pt will increase reading comprehension of short paragraphs to 90% acc via multiple choice responses when provided min cues. SLP Short Term Goal 4 - Progress: Progressing toward goal SLP Long Term Goals SLP Long Term Goal 1: Increase verbal expression to [facility] for moderately complex information when communicating with friends and family. SLP Long Term Goal 1 - Progress: Progressing toward goal  Assessment/Plan  Patient Active Problem List   Diagnosis Date Noted  . Carotid artery stenosis 07/14/2013  . Aphasia due to recent cerebral infarction 07/06/2013  . Occlusion and stenosis of carotid artery with cerebral infarction 06/29/2013  . Type II or unspecified type diabetes mellitus with neurological manifestations, not  stated as uncontrolled(250.60) 06/29/2013  . Stroke 06/27/2013  . Leukocytosis 11/17/2012  . TIA (transient ischemic attack) 11/16/2012  . HTN (hypertension) 01/04/2011  . Dyslipidemia 01/04/2011  . Former smoker 01/04/2011   SLP - End of Session Activity Tolerance: Patient tolerated treatment well General Behavior During Therapy: WFL for tasks assessed/performed  SLP Assessment/Plan Clinical Impression Statement: [name] attended session on time and brought her folder and glasses to session. She said that she didn't get a chance to do homework activities. Given short paragraphs, pt read with mild cueing for word initiation, and answered yes/no and multiple choice questions with 100% accuracy. SLP then reviewed list of word-finding strategies. She was very receptive to this. SLP went through list using example words so that pt fully understood the strategies provided. Next, abstract categories were presented were pt was encouraged to name at least 5 items per category. SLP encouraged pt to write and verbalize responses as two modes of expressive language. Pt was very concerned that her written language was not as fluent as she would like it to be. However, she redirected to task with encouragement. She was able to provided at least 5 responses given mild situational cues in 5 categories. Mod cues provided for spelling during written portion. SLP encouraged pt to bring her folder and glasses to next scheduled session. Cont with current plan of care.  Speech Therapy Frequency: min 2x/week Potential to Achieve Goals: Good  GN    [name] S 07/22/2013, 12:00 PM

## 2013-07-24 ENCOUNTER — Emergency Department (HOSPITAL_COMMUNITY): Payer: Medicare Other

## 2013-07-24 ENCOUNTER — Emergency Department (HOSPITAL_COMMUNITY)
Admission: EM | Admit: 2013-07-24 | Discharge: 2013-07-24 | Disposition: A | Discharge status: Home / Self Care | Payer: Medicare Other | Attending: Emergency Medicine | Admitting: Emergency Medicine

## 2013-07-24 ENCOUNTER — Encounter (HOSPITAL_COMMUNITY): Payer: Self-pay | Admitting: Emergency Medicine

## 2013-07-24 DIAGNOSIS — E1142 Type 2 diabetes mellitus with diabetic polyneuropathy: Secondary | ICD-10-CM | POA: Insufficient documentation

## 2013-07-24 DIAGNOSIS — Z7982 Long term (current) use of aspirin: Secondary | ICD-10-CM | POA: Insufficient documentation

## 2013-07-24 DIAGNOSIS — R269 Unspecified abnormalities of gait and mobility: Secondary | ICD-10-CM | POA: Insufficient documentation

## 2013-07-24 DIAGNOSIS — Z79899 Other long term (current) drug therapy: Secondary | ICD-10-CM | POA: Insufficient documentation

## 2013-07-24 DIAGNOSIS — R112 Nausea with vomiting, unspecified: Secondary | ICD-10-CM | POA: Insufficient documentation

## 2013-07-24 DIAGNOSIS — R4701 Aphasia: Secondary | ICD-10-CM | POA: Insufficient documentation

## 2013-07-24 DIAGNOSIS — E1149 Type 2 diabetes mellitus with other diabetic neurological complication: Secondary | ICD-10-CM | POA: Insufficient documentation

## 2013-07-24 DIAGNOSIS — E785 Hyperlipidemia, unspecified: Secondary | ICD-10-CM | POA: Insufficient documentation

## 2013-07-24 DIAGNOSIS — Z9889 Other specified postprocedural states: Secondary | ICD-10-CM | POA: Insufficient documentation

## 2013-07-24 DIAGNOSIS — F17200 Nicotine dependence, unspecified, uncomplicated: Secondary | ICD-10-CM | POA: Insufficient documentation

## 2013-07-24 DIAGNOSIS — R4182 Altered mental status, unspecified: Secondary | ICD-10-CM | POA: Insufficient documentation

## 2013-07-24 DIAGNOSIS — Z8673 Personal history of transient ischemic attack (TIA), and cerebral infarction without residual deficits: Secondary | ICD-10-CM | POA: Insufficient documentation

## 2013-07-24 DIAGNOSIS — D72829 Elevated white blood cell count, unspecified: Secondary | ICD-10-CM | POA: Insufficient documentation

## 2013-07-24 DIAGNOSIS — I10 Essential (primary) hypertension: Secondary | ICD-10-CM | POA: Insufficient documentation

## 2013-07-24 LAB — COMPREHENSIVE METABOLIC PANEL
ALBUMIN: 3.9 g/dL (ref 3.5–5.2)
ALT: 9 U/L (ref 0–35)
AST: 13 U/L (ref 0–37)
Alkaline Phosphatase: 76 U/L (ref 39–117)
BILIRUBIN TOTAL: 0.7 mg/dL (ref 0.3–1.2)
BUN: 17 mg/dL (ref 6–23)
CO2: 32 mEq/L (ref 19–32)
CREATININE: 0.7 mg/dL (ref 0.50–1.10)
Calcium: 10.6 mg/dL — ABNORMAL HIGH (ref 8.4–10.5)
Chloride: 96 mEq/L (ref 96–112)
GFR calc Af Amer: >90 mL/min (ref >90)
GFR, EST NON AFRICAN AMERICAN: 85 mL/min — AB (ref >90)
Glucose, Bld: 184 mg/dL — ABNORMAL HIGH (ref 70–99)
Potassium: 4.3 mEq/L (ref 3.7–5.3)
Sodium: 138 mEq/L (ref 137–147)
Total Protein: 7.5 g/dL (ref 6.0–8.3)

## 2013-07-24 LAB — CBC
HEMATOCRIT: 39.5 % (ref 36.0–46.0)
Hemoglobin: 13.3 g/dL (ref 12.0–15.0)
MCH: 29.7 pg (ref 26.0–34.0)
MCHC: 33.7 g/dL (ref 30.0–36.0)
MCV: 88.2 fL (ref 78.0–100.0)
Platelets: 552 10*3/uL — ABNORMAL HIGH (ref 150–400)
RBC: 4.48 MIL/uL (ref 3.87–5.11)
RDW: 14.2 % (ref 11.5–15.5)
WBC: 14.8 10*3/uL — AB (ref 4.0–10.5)

## 2013-07-24 LAB — GLUCOSE, CAPILLARY: GLUCOSE-CAPILLARY: 180 mg/dL — AB (ref 70–99)

## 2013-07-24 LAB — URINALYSIS, ROUTINE W REFLEX MICROSCOPIC
Glucose, UA: >1000 mg/dL — AB
Hgb urine dipstick: NEGATIVE
Ketones, ur: 15 mg/dL — AB
Leukocytes, UA: NEGATIVE
NITRITE: NEGATIVE
Protein, ur: NEGATIVE mg/dL
Specific Gravity, Urine: 1.029 (ref 1.005–1.030)
UROBILINOGEN UA: 0.2 mg/dL (ref 0.0–1.0)
pH: 5.5 (ref 5.0–8.0)

## 2013-07-24 LAB — URINE MICROSCOPIC-ADD ON

## 2013-07-24 MED ORDER — ACETAMINOPHEN 325 MG PO TABS
650.0000 mg | ORAL_TABLET | Freq: Once | ORAL | Status: AC
  Administered 2013-07-24: 650 mg via ORAL
  Filled 2013-07-24: qty 2

## 2013-07-24 MED ORDER — ONDANSETRON 4 MG PO TBDP
8.0000 mg | ORAL_TABLET | Freq: Once | ORAL | Status: AC
  Administered 2013-07-24: 8 mg via ORAL
  Filled 2013-07-24: qty 2

## 2013-07-24 NOTE — ED Notes (Signed)
MD at bedside. 

## 2013-07-24 NOTE — ED Notes (Signed)
Pt ambulated with standby assist. Pt able to ambulate with no noted difficulty. Denies dizziness, lightheadedness. Gait steady.

## 2013-07-24 NOTE — ED Notes (Signed)
Daughter reports that pt has slurred speech, unequal gait and confusion possible onset Monday or Tuesday. Pt recently had stroke, had artery cleaning on left neck done one 07/14/2013. Pt is going through speech therapy and had difficulty getting out of car on Wednesday.

## 2013-07-24 NOTE — ED Notes (Signed)
Daughters at bedside report that they have noticed since Monday pt has increased difficulty finding words and forming thoughts, that pt is leaning to left when she walks (deny recent fall) and slurred speech on Tuesday that has resolved. Pt denies any c/o. AO x4 but has difficulty forming thoughts and following commands quickly.

## 2013-07-24 NOTE — Discharge Instructions (Signed)
Please continue to take oral fluids as much as possible. Return if any further signs of infection such as fever or unable to tolerate fluids. Followup with your primary care Dr. as soon as possible

## 2013-07-24 NOTE — ED Provider Notes (Signed)
CSN: [id number]     Arrival date & time 07/24/13  5956 History   First MD Initiated Contact with Patient 07/24/13 1151     Chief Complaint  Patient presents with  . Aphasia  . Altered Mental Status  . Gait Problem   (Consider location/radiation/quality/duration/timing/severity/associated sxs/prior Treatment) HPI This is a 73 year old female with a history of diabetes status post stroke 12/13 , status post left carotid endarterectomy 12/30 who presents today with increased word finding difficulty, listing to the left with gait, and some increased confusion for 2-3 days. Her daughter who lives in the same household is the chief historian. The patient is able to answer questions. The patient does not perceive that she is having worsening problems. Neither the patient nor the daughter report any recent history of, chills, cough, chest pain, vomiting, diarrhea, or urinary tract infection symptoms. The day after she came home from her carotid endarterectomy surgery she complained of some right-sided headache which she complained of for 2 days. She was discharged home on 12/31. In general she had been doing well getting around and going out to visit people and going to speech therapy. She has not been doing her general housework which she had done prior to the first stroke on 12/13. She did cook chicken and dumplings  yesterday and did not seem to have any problems with this. Patient presented with her stroke on  12/13 with some word finding problems and an  time showed an acute infarct of the left your frontal lobe. Previously, she had a carotid evaluation revealing 60% stenosis but during that hospitalization it revealed 80% stenosis of her left carotid. Subsequently, arrangements were made with Dr. Oneida Alar for surgery which proceeded on 12/30 and she was discharged home on 12/31. The daughter states that she called Mrs. Donahoo primary care physician, Dr. Redmond School,  today and was told to come to the emergency  department. The patient has been a smoker but quit smoking in mid December when she had the stroke. She has a history of hypertension, dyslipidemia, and diabetes. The strips that were sent her to do her blood sugar testing were not correct for her machine so she has not been testing her blood sugar over the past several days. She takes oral hypoglycemic agents. She has been eating and drinking as usual. Past Medical History  Diagnosis Date  . Neuropathy, diabetic   . Dyslipidemia     takes Crestor daily  . Smoker   . Hypertension     takes Prinzide and Verapamil daily  . Diabetes mellitus     takes Janumet daily  . PONV (postoperative nausea and vomiting)   . Impaired speech     from stroke  . Vertigo     but doesn't take any meds  . Stroke 06/25/13   Past Surgical History  Procedure Laterality Date  . Cholecystectomy    . Knee surgery Left 59yrs ago  . Cataract removed Right   . Endarterectomy Left 07/14/2013    Procedure: ENDARTERECTOMY CAROTID-LEFT;  Surgeon: Elam Dutch, MD;  Location: [facility];  Service: Vascular;  Laterality: Left;  . Patch angioplasty Left 07/14/2013    Procedure: LEFT CAROTID ARTERY PATCH ANGIOPLASTY;  Surgeon: Elam Dutch, MD;  Location: [facility];  Service: Vascular;  Laterality: Left;   No family history on file. History  Substance Use Topics  . Smoking status: Current Every Day Smoker  . Smokeless tobacco: Not on file     Comment: hasn't smoked since  Sat,Dec 13  . Alcohol Use: No   OB History   Grav Para Term Preterm Abortions TAB SAB Ect Mult Living                 Review of Systems  All other systems reviewed and are negative.    Allergies  Codeine and Lipitor  Home Medications   Current Outpatient Rx  Name  Route  Sig  Dispense  Refill  . aspirin EC 325 MG EC tablet   Oral   Take 1 tablet (325 mg total) by mouth daily.   30 tablet   0   . lisinopril-hydrochlorothiazide (PRINZIDE,ZESTORETIC) 20-12.5 MG per tablet   Oral    Take 1 tablet by mouth daily.         . rosuvastatin (CRESTOR) 20 MG tablet   Oral   Take 1 tablet (20 mg total) by mouth daily.   30 tablet   11   . sitaGLIPtin-metformin (JANUMET) 50-1000 MG per tablet   Oral   Take 1 tablet by mouth 2 (two) times daily with a meal.   60 tablet   5   . traMADol (ULTRAM) 50 MG tablet   Oral   Take 1 tablet (50 mg total) by mouth every 6 (six) hours as needed.   20 tablet   0   . verapamil (CALAN-SR) 240 MG CR tablet   Oral   Take 240 mg by mouth daily.          BP 178/63  Pulse 76  Temp(Src) 97.9 F (36.6 C) (Oral)  Resp 13  Ht 5\' 6"  (1.676 m)  Wt 148 lb (67.132 kg)  BMI 23.90 kg/m2  SpO2 99% Physical Exam  Nursing note and vitals reviewed. Constitutional: She is oriented to person, place, and time. She appears well-developed and well-nourished.  HENT:  Head: Normocephalic and atraumatic.  Right Ear: Tympanic membrane and external ear normal.  Left Ear: Tympanic membrane and external ear normal.  Nose: Nose normal. Right sinus exhibits no maxillary sinus tenderness and no frontal sinus tenderness. Left sinus exhibits no maxillary sinus tenderness and no frontal sinus tenderness.  Eyes: Conjunctivae and EOM are normal. Pupils are equal, round, and reactive to light. Right eye exhibits no nystagmus. Left eye exhibits no nystagmus.  Neck: Normal range of motion. Neck supple.  Cardiovascular: Normal rate, regular rhythm, normal heart sounds and intact distal pulses.   Pulmonary/Chest: Effort normal and breath sounds normal. No respiratory distress. She exhibits no tenderness.  Abdominal: Soft. Bowel sounds are normal. She exhibits no distension and no mass. There is no tenderness.  Musculoskeletal: Normal range of motion. She exhibits no edema and no tenderness.  Neurological: She is alert and oriented to person, place, and time. She has normal strength and normal reflexes. No sensory deficit. She displays a negative Romberg sign. GCS  eye subscore is 4. GCS verbal subscore is 5. GCS motor subscore is 6.  Reflex Scores:      Tricep reflexes are 2+ on the right side and 2+ on the left side.      Bicep reflexes are 2+ on the right side and 2+ on the left side.      Brachioradialis reflexes are 2+ on the right side and 2+ on the left side.      Patellar reflexes are 2+ on the right side and 2+ on the left side.      Achilles reflexes are 2+ on the right side and 2+ on the left  side. Speech is normal without dysarthria, , or aphasia.Patient with some speech abnormality of interpolating word sounds.  Muscle strength is 5/5 in bilateral shoulders, elbow flexor and extensors, wrist flexor and extensors, and intrinsic hand muscles. 5/5 bilateral lower extremity hip flexors, extensors, knee flexors and extensors, and ankle dorsi and plantar flexors. She had some difficulty with finger to nose but appeared to be chiefly due to problems attending. Heel shin slightly abnormal bilaterally.     Skin: Skin is warm and dry. No rash noted.  Psychiatric: She has a normal mood and affect. Her behavior is normal. Judgment and thought content normal.    ED Course  Procedures (including critical care time) Labs Review Labs Reviewed  CBC - Abnormal; Notable for the following:    WBC 14.8 (*)    Platelets 552 (*)    All other components within normal limits  GLUCOSE, CAPILLARY - Abnormal; Notable for the following:    Glucose-Capillary 180 (*)    All other components within normal limits  COMPREHENSIVE METABOLIC PANEL  URINALYSIS, ROUTINE W REFLEX MICROSCOPIC   Imaging Review No results found.  EKG Interpretation   None       MDM  Blood sugar elevated here at 181.  Labs and head ct ordered.  These symptoms have been present for 72 plus hours and does not appear to be an acute stroke.  She may have had a post op stroke but ddx also includes metabolic abnormality, infection, toxins.    Patient had MRI here that does not show any  evidence of new stroke. She has continued to remain with the same neurological exam. He has had an episode of nausea and vomiting here. She is receiving Zofran and we will see if she is able to keep down fluids. Metabolically her eyelids are slightly elevated and she may have some mild volume depletion but appears stable from a neurological aspect.  She does have a leukocytosis but this is improved from her previous leukocytosis with a white blood cell count decreasing to 14,000 from first prior of 19,000. We have checked a rectal temperature and she does not appear to have any fever, chest x-Cordney [place] is clear, and urinalysis does not show a uti.   Patient given Zofran if she vomited here and is now taking by mouth without difficulty. She is dressed and ambulating in the hallway wishing for her discharge instructions.  Shaune Pollack, MD 07/24/13 1750

## 2013-07-25 ENCOUNTER — Inpatient Hospital Stay (HOSPITAL_COMMUNITY): Payer: Medicare Other

## 2013-07-25 ENCOUNTER — Emergency Department (HOSPITAL_COMMUNITY): Payer: Medicare Other

## 2013-07-25 ENCOUNTER — Inpatient Hospital Stay (HOSPITAL_COMMUNITY)
Admission: EM | Admit: 2013-07-25 | Discharge: 2013-07-30 | DRG: 871 | Disposition: A | Discharge status: Home Health | Payer: Medicare Other | Attending: Internal Medicine | Admitting: Internal Medicine

## 2013-07-25 ENCOUNTER — Encounter (HOSPITAL_COMMUNITY): Payer: Self-pay | Admitting: *Deleted

## 2013-07-25 DIAGNOSIS — E1149 Type 2 diabetes mellitus with other diabetic neurological complication: Secondary | ICD-10-CM

## 2013-07-25 DIAGNOSIS — A0472 Enterocolitis due to Clostridium difficile, not specified as recurrent: Secondary | ICD-10-CM

## 2013-07-25 DIAGNOSIS — Z79899 Other long term (current) drug therapy: Secondary | ICD-10-CM

## 2013-07-25 DIAGNOSIS — I69320 Aphasia following cerebral infarction: Secondary | ICD-10-CM

## 2013-07-25 DIAGNOSIS — R112 Nausea with vomiting, unspecified: Secondary | ICD-10-CM

## 2013-07-25 DIAGNOSIS — Z9889 Other specified postprocedural states: Secondary | ICD-10-CM

## 2013-07-25 DIAGNOSIS — E876 Hypokalemia: Secondary | ICD-10-CM | POA: Diagnosis not present

## 2013-07-25 DIAGNOSIS — D649 Anemia, unspecified: Secondary | ICD-10-CM | POA: Diagnosis present

## 2013-07-25 DIAGNOSIS — T450X5A Adverse effect of antiallergic and antiemetic drugs, initial encounter: Secondary | ICD-10-CM | POA: Diagnosis not present

## 2013-07-25 DIAGNOSIS — J9600 Acute respiratory failure, unspecified whether with hypoxia or hypercapnia: Secondary | ICD-10-CM

## 2013-07-25 DIAGNOSIS — I6350 Cerebral infarction due to unspecified occlusion or stenosis of unspecified cerebral artery: Secondary | ICD-10-CM

## 2013-07-25 DIAGNOSIS — E869 Volume depletion, unspecified: Secondary | ICD-10-CM | POA: Diagnosis present

## 2013-07-25 DIAGNOSIS — Z87891 Personal history of nicotine dependence: Secondary | ICD-10-CM

## 2013-07-25 DIAGNOSIS — R197 Diarrhea, unspecified: Secondary | ICD-10-CM

## 2013-07-25 DIAGNOSIS — Z885 Allergy status to narcotic agent status: Secondary | ICD-10-CM

## 2013-07-25 DIAGNOSIS — E785 Hyperlipidemia, unspecified: Secondary | ICD-10-CM

## 2013-07-25 DIAGNOSIS — R404 Transient alteration of awareness: Secondary | ICD-10-CM

## 2013-07-25 DIAGNOSIS — Z7982 Long term (current) use of aspirin: Secondary | ICD-10-CM

## 2013-07-25 DIAGNOSIS — R6520 Severe sepsis without septic shock: Secondary | ICD-10-CM

## 2013-07-25 DIAGNOSIS — E1159 Type 2 diabetes mellitus with other circulatory complications: Secondary | ICD-10-CM | POA: Diagnosis present

## 2013-07-25 DIAGNOSIS — G039 Meningitis, unspecified: Secondary | ICD-10-CM

## 2013-07-25 DIAGNOSIS — F0390 Unspecified dementia without behavioral disturbance: Secondary | ICD-10-CM

## 2013-07-25 DIAGNOSIS — R443 Hallucinations, unspecified: Secondary | ICD-10-CM | POA: Diagnosis not present

## 2013-07-25 DIAGNOSIS — Z888 Allergy status to other drugs, medicaments and biological substances status: Secondary | ICD-10-CM

## 2013-07-25 DIAGNOSIS — I63239 Cerebral infarction due to unspecified occlusion or stenosis of unspecified carotid arteries: Secondary | ICD-10-CM

## 2013-07-25 DIAGNOSIS — I10 Essential (primary) hypertension: Secondary | ICD-10-CM

## 2013-07-25 DIAGNOSIS — R579 Shock, unspecified: Secondary | ICD-10-CM | POA: Diagnosis present

## 2013-07-25 DIAGNOSIS — I639 Cerebral infarction, unspecified: Secondary | ICD-10-CM

## 2013-07-25 DIAGNOSIS — A419 Sepsis, unspecified organism: Principal | ICD-10-CM

## 2013-07-25 DIAGNOSIS — E1142 Type 2 diabetes mellitus with diabetic polyneuropathy: Secondary | ICD-10-CM | POA: Diagnosis present

## 2013-07-25 DIAGNOSIS — R1110 Vomiting, unspecified: Secondary | ICD-10-CM

## 2013-07-25 DIAGNOSIS — R21 Rash and other nonspecific skin eruption: Secondary | ICD-10-CM | POA: Diagnosis present

## 2013-07-25 DIAGNOSIS — F17200 Nicotine dependence, unspecified, uncomplicated: Secondary | ICD-10-CM | POA: Diagnosis present

## 2013-07-25 DIAGNOSIS — R410 Disorientation, unspecified: Secondary | ICD-10-CM

## 2013-07-25 DIAGNOSIS — G9341 Metabolic encephalopathy: Secondary | ICD-10-CM | POA: Diagnosis present

## 2013-07-25 DIAGNOSIS — I69920 Aphasia following unspecified cerebrovascular disease: Secondary | ICD-10-CM

## 2013-07-25 LAB — COMPREHENSIVE METABOLIC PANEL
ALT: 10 U/L (ref 0–35)
AST: 13 U/L (ref 0–37)
Albumin: 4.1 g/dL (ref 3.5–5.2)
Alkaline Phosphatase: 81 U/L (ref 39–117)
BUN: 20 mg/dL (ref 6–23)
CALCIUM: 10.8 mg/dL — AB (ref 8.4–10.5)
CO2: 29 meq/L (ref 19–32)
CREATININE: 0.73 mg/dL (ref 0.50–1.10)
Chloride: 95 mEq/L — ABNORMAL LOW (ref 96–112)
GFR calc Af Amer: >90 mL/min (ref >90)
GFR, EST NON AFRICAN AMERICAN: 83 mL/min — AB (ref >90)
Glucose, Bld: 288 mg/dL — ABNORMAL HIGH (ref 70–99)
Potassium: 4.1 mEq/L (ref 3.7–5.3)
SODIUM: 142 meq/L (ref 137–147)
TOTAL PROTEIN: 7.7 g/dL (ref 6.0–8.3)
Total Bilirubin: 0.7 mg/dL (ref 0.3–1.2)

## 2013-07-25 LAB — POCT I-STAT 3, ART BLOOD GAS (G3+)
Acid-base deficit: 1 mmol/L (ref 0.0–2.0)
BICARBONATE: 24.2 meq/L — AB (ref 20.0–24.0)
O2 Saturation: 97 %
PH ART: 7.351 (ref 7.350–7.450)
PO2 ART: 102 mmHg — AB (ref 80.0–100.0)
TCO2: 25 mmol/L (ref 0–100)
pCO2 arterial: 45 mmHg (ref 35.0–45.0)

## 2013-07-25 LAB — DIFFERENTIAL
Basophils Absolute: 0 10*3/uL (ref 0.0–0.1)
Basophils Relative: 0 % (ref 0–1)
Eosinophils Absolute: 0.1 10*3/uL (ref 0.0–0.7)
Eosinophils Relative: 0 % (ref 0–5)
LYMPHS PCT: 10 % — AB (ref 12–46)
Lymphs Abs: 2.2 10*3/uL (ref 0.7–4.0)
MONO ABS: 0.8 10*3/uL (ref 0.1–1.0)
Monocytes Relative: 4 % (ref 3–12)
Neutro Abs: 18.3 10*3/uL — ABNORMAL HIGH (ref 1.7–7.7)
Neutrophils Relative %: 86 % — ABNORMAL HIGH (ref 43–77)

## 2013-07-25 LAB — CBC
HEMATOCRIT: 41.1 % (ref 36.0–46.0)
Hemoglobin: 14 g/dL (ref 12.0–15.0)
MCH: 30.2 pg (ref 26.0–34.0)
MCHC: 34.1 g/dL (ref 30.0–36.0)
MCV: 88.8 fL (ref 78.0–100.0)
Platelets: 543 10*3/uL — ABNORMAL HIGH (ref 150–400)
RBC: 4.63 MIL/uL (ref 3.87–5.11)
RDW: 14.3 % (ref 11.5–15.5)
WBC: 21.4 10*3/uL — AB (ref 4.0–10.5)

## 2013-07-25 LAB — POCT I-STAT TROPONIN I: Troponin i, poc: 0 ng/mL (ref 0.00–0.08)

## 2013-07-25 LAB — URINE MICROSCOPIC-ADD ON

## 2013-07-25 LAB — POCT I-STAT, CHEM 8
BUN: 29 mg/dL — ABNORMAL HIGH (ref 6–23)
CALCIUM ION: 1.27 mmol/L (ref 1.13–1.30)
CHLORIDE: 96 meq/L (ref 96–112)
Creatinine, Ser: 0.9 mg/dL (ref 0.50–1.10)
GLUCOSE: 293 mg/dL — AB (ref 70–99)
HCT: 46 % (ref 36.0–46.0)
Hemoglobin: 15.6 g/dL — ABNORMAL HIGH (ref 12.0–15.0)
Potassium: 4.6 mEq/L (ref 3.7–5.3)
Sodium: 138 mEq/L (ref 137–147)
TCO2: 33 mmol/L (ref 0–100)

## 2013-07-25 LAB — PROTIME-INR
INR: 0.96 (ref 0.00–1.49)
Prothrombin Time: 12.6 seconds (ref 11.6–15.2)

## 2013-07-25 LAB — INFLUENZA PANEL BY PCR (TYPE A & B)
H1N1FLUPCR: NOT DETECTED
INFLAPCR: NEGATIVE
INFLBPCR: NEGATIVE

## 2013-07-25 LAB — URINALYSIS, ROUTINE W REFLEX MICROSCOPIC
BILIRUBIN URINE: NEGATIVE
Glucose, UA: >1000 mg/dL — AB
HGB URINE DIPSTICK: NEGATIVE
KETONES UR: 15 mg/dL — AB
Leukocytes, UA: NEGATIVE
Nitrite: NEGATIVE
PH: 6.5 (ref 5.0–8.0)
Protein, ur: 30 mg/dL — AB
Specific Gravity, Urine: 1.025 (ref 1.005–1.030)
Urobilinogen, UA: 0.2 mg/dL (ref 0.0–1.0)

## 2013-07-25 LAB — RAPID URINE DRUG SCREEN, HOSP PERFORMED
Amphetamines: NOT DETECTED
BENZODIAZEPINES: NOT DETECTED
Barbiturates: NOT DETECTED
COCAINE: NOT DETECTED
OPIATES: NOT DETECTED
TETRAHYDROCANNABINOL: NOT DETECTED

## 2013-07-25 LAB — CLOSTRIDIUM DIFFICILE BY PCR: CDIFFPCR: POSITIVE — AB

## 2013-07-25 LAB — ETHANOL

## 2013-07-25 LAB — GRAM STAIN

## 2013-07-25 LAB — GLUCOSE, CAPILLARY
Glucose-Capillary: 272 mg/dL — ABNORMAL HIGH (ref 70–99)
Glucose-Capillary: 267 mg/dL — ABNORMAL HIGH (ref 70–99)
Glucose-Capillary: 207 mg/dL — ABNORMAL HIGH (ref 70–99)

## 2013-07-25 LAB — CSF CELL COUNT WITH DIFFERENTIAL
RBC COUNT CSF: 2 /mm3 — AB
Tube #: 1
WBC, CSF: 1 /mm3 (ref 0–5)

## 2013-07-25 LAB — TSH: TSH: 0.778 u[IU]/mL (ref 0.350–4.500)

## 2013-07-25 LAB — TRIGLYCERIDES: Triglycerides: 135 mg/dL (ref <150)

## 2013-07-25 LAB — APTT: aPTT: 24 seconds (ref 24–37)

## 2013-07-25 LAB — LACTIC ACID, PLASMA: LACTIC ACID, VENOUS: 2.2 mmol/L (ref 0.5–2.2)

## 2013-07-25 LAB — PROTEIN AND GLUCOSE, CSF
Glucose, CSF: 132 mg/dL — ABNORMAL HIGH (ref 43–76)
Total  Protein, CSF: 47 mg/dL — ABNORMAL HIGH (ref 15–45)

## 2013-07-25 LAB — AMMONIA: AMMONIA: 24 umol/L (ref 11–60)

## 2013-07-25 LAB — TROPONIN I
TROPONIN I: 0.33 ng/mL — AB (ref <0.30)
Troponin I: <0.3 ng/mL (ref <0.30)
Troponin I: <0.3 ng/mL (ref <0.30)

## 2013-07-25 LAB — MRSA PCR SCREENING: MRSA by PCR: NEGATIVE

## 2013-07-25 LAB — PROCALCITONIN: Procalcitonin: <0.1 ng/mL

## 2013-07-25 MED ORDER — MIDAZOLAM HCL 2 MG/2ML IJ SOLN: 1.0000 mg | INTRAMUSCULAR | Status: DC | PRN

## 2013-07-25 MED ORDER — SODIUM CHLORIDE 0.9 % IV BOLUS (SEPSIS): 1000.0000 mL | Freq: Once | INTRAVENOUS | Status: DC

## 2013-07-25 MED ORDER — PROPOFOL 10 MG/ML IV EMUL
0.0000 ug/kg/min | INTRAVENOUS | Status: DC
  Administered 2013-07-25: 5 ug/kg/min via INTRAVENOUS
  Filled 2013-07-25: qty 100

## 2013-07-25 MED ORDER — ACETAMINOPHEN 650 MG RE SUPP
650.0000 mg | Freq: Once | RECTAL | Status: AC
Start: 2013-07-25 — End: 2013-07-25
  Administered 2013-07-25: 650 mg via RECTAL
  Filled 2013-07-25: qty 1

## 2013-07-25 MED ORDER — DEXTROSE 5 % IV SOLN
10.0000 mg/kg | Freq: Three times a day (TID) | INTRAVENOUS | Status: DC
  Administered 2013-07-25: 670 mg via INTRAVENOUS
  Filled 2013-07-25 (×3): qty 13.4

## 2013-07-25 MED ORDER — DEXTROSE-NACL 5-0.9 % IV SOLN: INTRAVENOUS | Status: DC

## 2013-07-25 MED ORDER — VANCOMYCIN HCL 10 G IV SOLR
1500.0000 mg | INTRAVENOUS | Status: AC
  Administered 2013-07-25: 1500 mg via INTRAVENOUS
  Filled 2013-07-25: qty 1500

## 2013-07-25 MED ORDER — VANCOMYCIN 50 MG/ML ORAL SOLUTION
125.0000 mg | Freq: Four times a day (QID) | ORAL | Status: DC
  Filled 2013-07-25 (×3): qty 2.5

## 2013-07-25 MED ORDER — BIOTENE DRY MOUTH MT LIQD
15.0000 mL | Freq: Four times a day (QID) | OROMUCOSAL | Status: DC
  Administered 2013-07-25 – 2013-07-30 (×16): 15 mL via OROMUCOSAL

## 2013-07-25 MED ORDER — HALOPERIDOL LACTATE 5 MG/ML IJ SOLN
INTRAMUSCULAR | Status: AC
  Filled 2013-07-25: qty 1

## 2013-07-25 MED ORDER — CHLORHEXIDINE GLUCONATE 0.12 % MT SOLN
15.0000 mL | Freq: Two times a day (BID) | OROMUCOSAL | Status: DC
  Administered 2013-07-25 – 2013-07-27 (×5): 15 mL via OROMUCOSAL
  Filled 2013-07-25 (×6): qty 15

## 2013-07-25 MED ORDER — HALOPERIDOL LACTATE 5 MG/ML IJ SOLN
2.5000 mg | Freq: Once | INTRAMUSCULAR | Status: AC
  Administered 2013-07-25: 2.5 mg via INTRAVENOUS

## 2013-07-25 MED ORDER — INSULIN ASPART 100 UNIT/ML ~~LOC~~ SOLN
0.0000 [IU] | SUBCUTANEOUS | Status: DC
  Administered 2013-07-25: 5 [IU] via SUBCUTANEOUS
  Administered 2013-07-25: 8 [IU] via SUBCUTANEOUS
  Administered 2013-07-26 (×2): 2 [IU] via SUBCUTANEOUS
  Administered 2013-07-26 (×2): 3 [IU] via SUBCUTANEOUS
  Administered 2013-07-26 – 2013-07-27 (×3): 2 [IU] via SUBCUTANEOUS

## 2013-07-25 MED ORDER — MIDAZOLAM HCL 2 MG/2ML IJ SOLN
4.0000 mg | Freq: Once | INTRAMUSCULAR | Status: AC
  Administered 2013-07-25: 4 mg via INTRAVENOUS

## 2013-07-25 MED ORDER — FENTANYL CITRATE 0.05 MG/ML IJ SOLN
200.0000 ug | Freq: Once | INTRAMUSCULAR | Status: AC
  Administered 2013-07-25: 200 ug via INTRAVENOUS

## 2013-07-25 MED ORDER — ONDANSETRON HCL 4 MG PO TABS: 4.0000 mg | ORAL_TABLET | Freq: Four times a day (QID) | ORAL | Status: DC | PRN

## 2013-07-25 MED ORDER — ONDANSETRON HCL 4 MG/2ML IJ SOLN
INTRAMUSCULAR | Status: AC
  Administered 2013-07-25: 05:00:00 4 mg via INTRAVENOUS
  Filled 2013-07-25: qty 2

## 2013-07-25 MED ORDER — DEXTROSE 5 % IV SOLN
2.0000 g | INTRAVENOUS | Status: AC
  Administered 2013-07-25: 2 g via INTRAVENOUS
  Filled 2013-07-25: qty 2

## 2013-07-25 MED ORDER — ACETAMINOPHEN 325 MG PO TABS: 650.0000 mg | ORAL_TABLET | Freq: Four times a day (QID) | ORAL | Status: DC | PRN

## 2013-07-25 MED ORDER — METRONIDAZOLE IN NACL 5-0.79 MG/ML-% IV SOLN
500.0000 mg | Freq: Three times a day (TID) | INTRAVENOUS | Status: DC
  Administered 2013-07-25 – 2013-07-28 (×9): 500 mg via INTRAVENOUS
  Filled 2013-07-25 (×11): qty 100

## 2013-07-25 MED ORDER — ETOMIDATE 2 MG/ML IV SOLN
10.0000 mg | Freq: Once | INTRAVENOUS | Status: AC
  Administered 2013-07-25: 10 mg via INTRAVENOUS

## 2013-07-25 MED ORDER — ACETAMINOPHEN 160 MG/5ML PO SOLN: 650.0000 mg | Freq: Four times a day (QID) | ORAL | Status: DC | PRN

## 2013-07-25 MED ORDER — ASPIRIN 81 MG PO CHEW
81.0000 mg | CHEWABLE_TABLET | Freq: Every day | ORAL | Status: DC
  Administered 2013-07-25 – 2013-07-28 (×4): 81 mg via ORAL
  Filled 2013-07-25 (×4): qty 1

## 2013-07-25 MED ORDER — SODIUM CHLORIDE 0.9 % IJ SOLN
3.0000 mL | Freq: Two times a day (BID) | INTRAMUSCULAR | Status: DC
  Administered 2013-07-25 – 2013-07-29 (×8): 3 mL via INTRAVENOUS

## 2013-07-25 MED ORDER — FAMOTIDINE 40 MG/5ML PO SUSR
20.0000 mg | Freq: Two times a day (BID) | ORAL | Status: DC
  Administered 2013-07-25 – 2013-07-26 (×3): 20 mg via ORAL
  Filled 2013-07-25 (×5): qty 2.5

## 2013-07-25 MED ORDER — ENOXAPARIN SODIUM 40 MG/0.4ML ~~LOC~~ SOLN
40.0000 mg | SUBCUTANEOUS | Status: DC
  Administered 2013-07-25 – 2013-07-29 (×5): 40 mg via SUBCUTANEOUS
  Filled 2013-07-25 (×6): qty 0.4

## 2013-07-25 MED ORDER — LORAZEPAM 2 MG/ML IJ SOLN
1.0000 mg | Freq: Once | INTRAMUSCULAR | Status: AC
  Administered 2013-07-25: 1 mg via INTRAVENOUS

## 2013-07-25 MED ORDER — ONDANSETRON HCL 4 MG/2ML IJ SOLN
4.0000 mg | Freq: Once | INTRAMUSCULAR | Status: AC
  Administered 2013-07-25: 4 mg via INTRAVENOUS

## 2013-07-25 MED ORDER — SODIUM CHLORIDE 0.9 % IV SOLN
INTRAVENOUS | Status: DC
  Administered 2013-07-25 – 2013-07-27 (×2): via INTRAVENOUS
  Administered 2013-07-28: 1000 mL via INTRAVENOUS

## 2013-07-25 MED ORDER — ACETAMINOPHEN 650 MG RE SUPP
650.0000 mg | Freq: Four times a day (QID) | RECTAL | Status: DC | PRN
  Administered 2013-07-25: 650 mg via RECTAL
  Filled 2013-07-25: qty 1

## 2013-07-25 MED ORDER — FENTANYL CITRATE 0.05 MG/ML IJ SOLN
50.0000 ug | INTRAMUSCULAR | Status: DC | PRN
  Administered 2013-07-26: 50 ug via INTRAVENOUS
  Filled 2013-07-25: qty 2

## 2013-07-25 MED ORDER — LORAZEPAM 2 MG/ML IJ SOLN
INTRAMUSCULAR | Status: AC
  Administered 2013-07-25: 05:00:00 1 mg via INTRAVENOUS
  Filled 2013-07-25: qty 1

## 2013-07-25 MED ORDER — ONDANSETRON HCL 4 MG/2ML IJ SOLN
4.0000 mg | Freq: Four times a day (QID) | INTRAMUSCULAR | Status: DC | PRN
  Administered 2013-07-26 – 2013-07-28 (×3): 4 mg via INTRAVENOUS
  Filled 2013-07-25 (×5): qty 2

## 2013-07-25 MED ORDER — VANCOMYCIN 50 MG/ML ORAL SOLUTION
125.0000 mg | Freq: Four times a day (QID) | ORAL | Status: DC
  Administered 2013-07-25 – 2013-07-28 (×11): 125 mg
  Filled 2013-07-25 (×14): qty 2.5

## 2013-07-25 NOTE — Procedures (Signed)
Central Venous Catheter Insertion Procedure Note TENELLE ANDREASON [id number] [birth year]  Procedure: Insertion of Central Venous Catheter Indications: Drug and/or fluid administration  Procedure Details Consent: Risks of procedure as well as the alternatives and risks of each were explained to the (patient/caregiver).  Consent for procedure obtained. Time Out: Verified patient identification, verified procedure, site/side was marked, verified correct patient position, special equipment/implants available, medications/allergies/relevent history reviewed, required imaging and test results available.  Performed  Maximum sterile technique was used including antiseptics, cap, gloves, gown, hand hygiene, mask and sheet. Skin prep: Chlorhexidine; local anesthetic administered A antimicrobial bonded/coated triple lumen catheter was placed in the right internal jugular vein using the Seldinger technique.  Evaluation Blood flow good Complications: No apparent complications Patient did tolerate procedure well. Chest X-ray ordered to verify placement.  CXR: pending.  Performed by Marni Griffon, ACNP.  I was present for procedure.  Chesley Mires, MD [facility] Pulmonary/Critical Care 07/25/2013, 3:24 PM Pager:  [phone] After 3pm call: [phone]

## 2013-07-25 NOTE — ED Notes (Signed)
Attempted report 

## 2013-07-25 NOTE — Progress Notes (Signed)
ANTIBIOTIC CONSULT NOTE - INITIAL  Pharmacy Consult for Acyclovir  Indication: r/o meningitis  Allergies  Allergen Reactions  . Codeine Nausea And Vomiting  . Lipitor [Atorvastatin] Nausea And Vomiting   Patient Measurements: 67.1 kg  Vital Signs: Temp: 99.9 F (37.7 C) (01/10 0419) Temp src: Rectal (01/10 0419) BP: 186/61 mmHg (01/10 0540) Pulse Rate: 114 (01/10 0540)  Labs:  Recent Labs  07/24/13 1120 07/25/13 0358 07/25/13 0407  WBC 14.8* 21.4*  --   HGB 13.3 14.0 15.6*  PLT 552* 543*  --   CREATININE 0.70 0.73 0.90   Microbiology: Recent Results (from the past 720 hour(s))  SURGICAL PCR SCREEN     Status: None   Collection Time    07/03/13 11:39 AM      Result Value Range Status   MRSA, PCR NEGATIVE  NEGATIVE Final   Staphylococcus aureus NEGATIVE  NEGATIVE Final   Comment:            The Xpert SA Assay (FDA     approved for NASAL specimens     in patients over 68 years of age),     is one component of     a comprehensive surveillance     program.  Test performance has     been validated by [employer] for patients greater     than or equal to 75 year old.     It is not intended     to diagnose infection nor to     guide or monitor treatment.    Medical History: Past Medical History  Diagnosis Date  . Neuropathy, diabetic   . Dyslipidemia     takes Crestor daily  . Smoker   . Hypertension     takes Prinzide and Verapamil daily  . Diabetes mellitus     takes Janumet daily  . PONV (postoperative nausea and vomiting)   . Impaired speech     from stroke  . Vertigo     but doesn't take any meds  . Stroke 06/25/13   Assessment: 73 y/o here with progressive confusion for 2-3 days to start acyclovir per pharmacy for r/o meningitis. Also getting vancomycin/ceftriaxone in the ED. WBC 21.4, Tmax 99.9, other labs as above.   Goal of Therapy:  Clinical resolution   Plan:  -Acyclovir 670 mg IV q8h -Trend WBC, temp, renal function  -F/U  additional broad spectrum CNS coverage   Narda Bonds 07/25/2013,5:51 AM

## 2013-07-25 NOTE — Progress Notes (Signed)
[facility] Progress Note Patient Name: [name] DOB: Apr 15, 1941 MRN: [id number]  Date of Service  07/25/2013   HPI/Events of Note   SUP, has c diff  eICU Interventions  Pepcid   Intervention Category Intermediate Interventions: Best-practice therapies (e.g. DVT, beta blocker, etc.)  [name] V. 07/25/2013, 7:29 PM

## 2013-07-25 NOTE — Progress Notes (Signed)
EEG completed; results pending.    

## 2013-07-25 NOTE — ED Notes (Signed)
Pt is still asleep, but arousable to speech and touch. Pt will not open eyes, but she does move to try to get away from touch.

## 2013-07-25 NOTE — Consult Note (Signed)
Name: [name] MRN: [id number] DOB: Dec 19, 1940    ADMISSION DATE:  07/25/2013 CONSULTATION DATE:  1/10  REFERRING MD :  Eliseo Squires PRIMARY SERVICE:  [facility]-->PCCM   CHIEF COMPLAINT:  Sepsis/occult shock/acute encephalopathy   BRIEF PATIENT DESCRIPTION:  This is a 73 year old female w/ recent h/o CVA,  presented to ER 1/10 w/ progressive MS change & diarrhea. Clinically deteriorated while in ER. C diff Positive. PCCM asked to see re: possible need for ICU admit.   SIGNIFICANT EVENTS: 1/09 Seen in ER w/ 72 hrs of AMS. Seen by EDP, MRI done neg for new stroke. 1/10 presents again w/ AMS, diarrhea and sepsis criteria. Seen by Neuro EEG done: c/w metabolic encephalopathy, LP done: not c/w meningitis. PCCM asked to see as pt clinically worse.   STUDIES:  1/09 CT head >> subacute Lt frontal stroke 1/09 MRI brain >> subacute Lt frontal stroke 1/10 CT head >> subacute Lt frontal stroke 1/10 LP >> glucose 132, protein 47, RBC 2, WBC 1  LINES / TUBES: ETT 1/10 >> Rt IJ CVL 1/10 >>  CULTURES: Cdiff 1/10: positive  bcx2 1/10>>> CSF 1/10: no orgs  ANTIBIOTICS: vanc 1/10>>>1/10 Oral vanc 1/10>>> Flagyl 1/10>>> Rocephin 1/10>>>1/10  HISTORY OF PRESENT ILLNESS:   73 y.o. female -Who was discharged in December after a stroke- mild aphasia but able to talk. Patient underwent CEA at the end of December. Presented 1/10, she presented with progressive confusion over the past 2 -3 days. She was initially noted to be slightly confused on 1/6 and since then has been progressively getting worse. She was seen in the ED on 1/9 and evaluated for this: she had an MRI which showed no new signs of stroke, had some episodes of vomiting then so was sent home with zofran. She presented again early in the am hours on 1/10 after 3 episodes of projective vomiting and progressive AMS to the point she was no longer verbal. She began to have diarrhea which has been on-going since the early am hours of 1/10, with on  going fevers and SIRS criteria. She was seen by neurology: EEG was c/w TME, LP was not c/w meningitis. C-diff PCR came back positive. PCCM asked to see on 1/10 given concern for clinical decline and concern that she would require ICU level care     PAST MEDICAL HISTORY :  Past Medical History  Diagnosis Date  . Neuropathy, diabetic   . Dyslipidemia     takes Crestor daily  . Smoker   . Hypertension     takes Prinzide and Verapamil daily  . Diabetes mellitus     takes Janumet daily  . PONV (postoperative nausea and vomiting)   . Impaired speech     from stroke  . Vertigo     but doesn't take any meds  . Stroke 06/25/13   Past Surgical History  Procedure Laterality Date  . Cholecystectomy    . Knee surgery Left 12yrs ago  . Cataract removed Right   . Endarterectomy Left 07/14/2013    Procedure: ENDARTERECTOMY CAROTID-LEFT;  Surgeon: Elam Dutch, MD;  Location: [facility];  Service: Vascular;  Laterality: Left;  . Patch angioplasty Left 07/14/2013    Procedure: LEFT CAROTID ARTERY PATCH ANGIOPLASTY;  Surgeon: Elam Dutch, MD;  Location: [facility];  Service: Vascular;  Laterality: Left;   Prior to Admission medications   Medication Sig Start Date End Date Taking? Authorizing Provider  aspirin EC 325 MG EC tablet Take 1  tablet (325 mg total) by mouth daily. 06/30/13  Yes Geradine Girt, DO  lisinopril-hydrochlorothiazide (PRINZIDE,ZESTORETIC) 20-12.5 MG per tablet Take 1 tablet by mouth daily.   Yes Historical Provider, MD  rosuvastatin (CRESTOR) 20 MG tablet Take 1 tablet (20 mg total) by mouth daily. 12/15/12  Yes [name], MD  sitaGLIPtin-metformin (JANUMET) 50-1000 MG per tablet Take 1 tablet by mouth 2 (two) times daily with a meal. 06/09/13  Yes [name], MD  traMADol (ULTRAM) 50 MG tablet Take 1 tablet (50 mg total) by mouth every 6 (six) hours as needed. 07/15/13  Yes Ulyses Amor, PA-C  verapamil (CALAN-SR) 240 MG CR tablet Take 240 mg by mouth daily.   Yes  Historical Provider, MD   Allergies  Allergen Reactions  . Codeine Nausea And Vomiting  . Lipitor [Atorvastatin] Nausea And Vomiting    FAMILY HISTORY:  No family history on file. SOCIAL HISTORY:  reports that she has been smoking.  She does not have any smokeless tobacco history on file. She reports that she does not drink alcohol or use illicit drugs.  REVIEW OF SYSTEMS:   Unable   SUBJECTIVE:  Appears critically ill  VITAL SIGNS: Temp:  [98.1 F (36.7 C)-103.2 F (39.6 C)] 103.2 F (39.6 C) (01/10 0955) Pulse Rate:  [87-135] 117 (01/10 1200) Resp:  [17-44] 44 (01/10 1200) BP: (123-186)/(57-120) 139/113 mmHg (01/10 1200) SpO2:  [92 %-100 %] 94 % (01/10 1200) Weight:  [67.132 kg (148 lb)] 67.132 kg (148 lb) (01/10 0555)  PHYSICAL EXAMINATION: General: acutely ill appearing white female, toxic appearing  Neuro:  Non-verbal, moans, localizes but does not f/c  HEENT: mucous membranes are dry, dentition is poor  Cardiovascular:  Tachy rrr  Lungs:  Clear but rr efforts labored and + accessory muscle use  Abdomen:  Tender to palp, hypoactive. incont of stool  Musculoskeletal:  Intact  Skin:  Dry w/ diffuse macular erythremic rash    Recent Labs Lab 07/24/13 1120 07/25/13 0358 07/25/13 0407  NA 138 142 138  K 4.3 4.1 4.6  CL 96 95* 96  CO2 32 29  --   BUN 17 20 29*  CREATININE 0.70 0.73 0.90  GLUCOSE 184* 288* 293*    Recent Labs Lab 07/24/13 1120 07/25/13 0358 07/25/13 0407  HGB 13.3 14.0 15.6*  HCT 39.5 41.1 46.0  WBC 14.8* 21.4*  --   PLT 552* 543*  --    Ct Head Wo Contrast  07/25/2013   CLINICAL DATA:  Stroke  EXAM: CT HEAD WITHOUT CONTRAST  TECHNIQUE: Contiguous axial images were obtained from the base of the skull through the vertex without intravenous contrast.  COMPARISON:  MRI and CT No new large vessel territory infarct identified. No acute intracranial hemorrhage. No mass or midline shift. No extra-axial fluid collection. Mild chronic  microvascular ischemic changes again noted.  Calvarium is intact. Orbits are within normal limits. Paranasal sinuses and mastoid air cells are clear. From 07/24/2013  FINDINGS: Vague hypodensity seen in region of previously identified ischemic infarct within the left frontal lobe is again seen.  IMPRESSION: Similar size and distribution of subacute left frontal lobe ischemic infarct. No new large vessel territory infarct or intracranial hemorrhage identified.  Mild chronic microvascular ischemic disease, unchanged.   Electronically Signed   By: Jeannine Boga M.D.   On: 07/25/2013 04:21   Ct Head Wo Contrast  07/24/2013   CLINICAL DATA:  Slurred speech and 9 ankle date and mental status change for  preceding 3 days  EXAM: CT HEAD WITHOUT CONTRAST  TECHNIQUE: Contiguous axial images were obtained from the base of the skull through the vertex without intravenous contrast.  COMPARISON:  MRI of the brain dated June 27, 2013  FINDINGS: The ventricles are normal in size and position. There is subtle decreased density in the deep white matter of the left frontal lobe consistent with known recent infarcts. I do not see evidence of an acute intracranial hemorrhage. No abnormality in the right cerebral hemisphere is demonstrated. The cerebellum and brainstem exhibit no acute abnormalities.  At bone window settings there is no lytic nor blastic lesion or evidence of a skull fracture. The observed portions of the paranasal sinuses and mastoid air cells are clear.  IMPRESSION: There is no evidence of an acute ischemic or hemorrhagic infarction. There are ongoing changes in the left frontal lobe consistent with the recent ischemic event. If there are strong clinical concerns of new ischemic symptoms, MRI now would be useful.   Electronically Signed   By: David  Martinique   On: 07/24/2013 13:03   Mr Brain Wo Contrast  07/24/2013   CLINICAL DATA:  aphasia.  Gait disturbance.  Confusion.  EXAM: MRI HEAD WITHOUT CONTRAST   TECHNIQUE: Multiplanar, multiecho pulse sequences of the brain and surrounding structures were obtained without intravenous contrast.  COMPARISON:  Head CT same day.  MRI 06/27/2013.  FINDINGS: Diffusion imaging does not show any new or acute insult. There is minimal residual restricted diffusion at the site of infarction which was acute in December in the left posterior frontal cortical and subcortical brain.  The brainstem and cerebellum are normal. There are mild chronic small-vessel changes affecting the cerebral hemispheric white matter. No mass lesion, hemorrhage, hydrocephalus or extra-axial collection. No pituitary mass. No significant sinus disease. No skull or skullbase lesion.  IMPRESSION: No new infarction when compared to the study of December. Some persistent residual restricted diffusion in the area of infarction in the left posterior frontal region which was acute in December.  Mild chronic small-vessel changes elsewhere affecting the cerebral hemispheric white matter.   Electronically Signed   By: Nelson Chimes M.D.   On: 07/24/2013 16:38    ASSESSMENT / PLAN: Pulmonary  A: Tachypnea. Currently gas exchange adequate. Concerned about possible aspiration given AMS and vomiting but current CXR clear.  P: Pulse ox Correct metabolic derangements  F/u cxr Proceed with intubation 1/10  Cardiac  A: Severe sepsis in setting of C diff colitis, complicated by volume depletion.  P: Repeat lactic acid r/o occult shock NS fluid bolus  Needs ICU monitoring, high risk of clinical deterioration   Renal A: No acute currently. Given marked diarrhea and clinical status concern that she will develop metabolic acidosis  P: IVFs Trend chemistry  strick I&O  GI A: Nausea and vomiting  Diarrhea/ C diff colitis  P: IV flagyl/ oral vanc  NPO    Heme A: Hemoconcentration P: Trend CBC [place] heparin   Endocrine  A: DM S: ssi   Infectious disease  A: C diff colitis PCXR is  negative.  U/A unremarkable  CSF negative  P: C diff protocol   Neurology A: Acute encephalopathy >> likely related to sepsis and C diff.  No evidence for recurrent stroke, or meningitis. P: -sedation protocol while on vent  Updated family about plan.  CC time 50 minutes.  Pulmonary and [place] Pager: [phone]  07/25/2013, 1:44 PM

## 2013-07-25 NOTE — ED Notes (Signed)
Gram Stain-WBC's present, no organisms seen

## 2013-07-25 NOTE — Procedures (Signed)
Intubation Procedure Note [name] [id number] [birth year]  Procedure: Intubation Indications: Respiratory insufficiency  Procedure Details Consent: Risks of procedure as well as the alternatives and risks of each were explained to the (patient/caregiver).  Consent for procedure obtained. Time Out: Verified patient identification, verified procedure, site/side was marked, verified correct patient position, special equipment/implants available, medications/allergies/relevent history reviewed, required imaging and test results available.  Performed  Maximum sterile technique was used including antiseptics, cap, gloves, gown, hand hygiene and mask.  MAC and 3    Evaluation Hemodynamic Status: BP stable throughout; O2 sats: stable throughout Patient's Current Condition: stable Complications: No apparent complications Patient did tolerate procedure well. Chest X-ray ordered to verify placement.  CXR: pending.  Performed by Marni Griffon, ACNP  I was present for procedure.  Chesley Mires, MD [facility] Pulmonary/Critical Care 07/25/2013, 3:14 PM Pager:  [phone] After 3pm call: [phone]

## 2013-07-25 NOTE — ED Notes (Signed)
Pt had green emesis and was incontinent of stool.

## 2013-07-25 NOTE — Procedures (Signed)
Indication:   Risks of the procedure were dicussed with the patient including post-LP headache, bleeding, infection, weakness/numbness of legs(radiculopathy), death.  The patient's husband agreed and written consent was obtained.   Due to agitation the patient was given 1mg  IV ativan, repeated x 1 for a total of 2mg  IV ativan. She continued to resist to the point that the procedure was not possible and therefore 2.5mg  IV haldol were administered.   The patient was prepped and draped, and using sterile technique a 20 gauge quinke spinal needle was inserted in the L4-5 space. Clear CSF return was obtained on the first pass. The opening pressure was 7 cm H2O. Approximately 6 cc of CSF were obtained and sent for analysis.

## 2013-07-25 NOTE — Progress Notes (Signed)
CRITICAL VALUE ALERT  Critical value received:  Trp I = 0.33  Date of notification:  07/25/13  Time of notification:  2219  Critical value read back:yes  Nurse who received alert:  Delphia Grates RN   MD notified (1st page):  Dr Elsworth Soho  Time of first page:  2221  MD notified (2nd page):  Time of second page:  Responding MD:  Dr Elsworth Soho  Time MD responded:  2221

## 2013-07-25 NOTE — H&P (Addendum)
[facility] History and Physical  [name] TIW:[id number] DOB: 1941-01-18 DOA: 07/25/2013  Referring physician: er PCP: Nichole Haste, MD   Chief Complaint: AMS  HPI: [name] is a 74 y.o. female - unable to give history- obtained from chart- no family at bedside-. Who was discharged last month after a stroke- mild aphasia but able to talk.  Patient underwent CEA at the end of December.  Today, she presented with progressive confusion over the past 2 -3 days. She was initially noted to be slightly confused on Tuesday and since then has been progressively getting worse. She was seen in the ED on 1/9 and evaluated for this: she had an MRI which showed no new signs of stroke. Tonight, she has gotten progressively worse and developed nausea/vomiting.  Around 1 am (1/10), she was noted to no longer be answering questions and therefore EMS was called.  She developed a macular rash prior to being brought to the ER tonight.  +N/V/D  LP was done in ER- results not back yet  Review of Systems:  Unable to Walker ROS due to AMS   Past Medical History  Diagnosis Date  . Neuropathy, diabetic   . Dyslipidemia     takes Crestor daily  . Smoker   . Hypertension     takes Prinzide and Verapamil daily  . Diabetes mellitus     takes Janumet daily  . PONV (postoperative nausea and vomiting)   . Impaired speech     from stroke  . Vertigo     but doesn't take any meds  . Stroke 06/25/13   Past Surgical History  Procedure Laterality Date  . Cholecystectomy    . Knee surgery Left 53yrs ago  . Cataract removed Right   . Endarterectomy Left 07/14/2013    Procedure: ENDARTERECTOMY CAROTID-LEFT;  Surgeon: Nichole Dutch, MD;  Location: [place];  Service: Vascular;  Laterality: Left;  . Patch angioplasty Left 07/14/2013    Procedure: LEFT CAROTID ARTERY PATCH ANGIOPLASTY;  Surgeon: Nichole Dutch, MD;  Location: [place];  Service: Vascular;  Laterality: Left;   Social History:   reports that she has been smoking.  She does not have any smokeless tobacco history on file. She reports that she does not drink alcohol or use illicit drugs.  Allergies  Allergen Reactions  . Codeine Nausea And Vomiting  . Lipitor [Atorvastatin] Nausea And Vomiting    No family history on file.   Prior to Admission medications   Medication Sig Start Date End Date Taking? Authorizing Provider  aspirin EC 325 MG EC tablet Take 1 tablet (325 mg total) by mouth daily. 06/30/13  Yes [name]  lisinopril-hydrochlorothiazide (PRINZIDE,ZESTORETIC) 20-12.5 MG per tablet Take 1 tablet by mouth daily.   Yes Historical Provider, MD  rosuvastatin (CRESTOR) 20 MG tablet Take 1 tablet (20 mg total) by mouth daily. 12/15/12  Yes Nichole Lung, MD  sitaGLIPtin-metformin (JANUMET) 50-1000 MG per tablet Take 1 tablet by mouth 2 (two) times daily with a meal. 06/09/13  Yes Nichole Lung, MD  traMADol (ULTRAM) 50 MG tablet Take 1 tablet (50 mg total) by mouth every 6 (six) hours as needed. 07/15/13  Yes Nichole Amor, PA-C  verapamil (CALAN-SR) 240 MG CR tablet Take 240 mg by mouth daily.   Yes Historical Provider, MD   Physical Exam: Filed Vitals:   07/25/13 0700  BP: 153/69  Pulse: 128  Temp: 102.9 F (39.4 C)  Resp: 26  BP 153/69  Pulse 128  Temp(Src) 102.9 F (39.4 C) (Rectal)  Resp 26  Wt 67.132 kg (148 lb)  SpO2 94%  General:  Snoring respirations, has received ativan and haldol- not speaking or following commands- withdraws to pain Eyes: PERRL, normal lids, irises & conjunctiva ENT: grossly normal hearing, lips & tongue Neck: no LAD, masses or thyromegaly Cardiovascular: RRR, no m/r/g. No LE edema. Telemetry: SR, no arrhythmias  Respiratory: CTA bilaterally, no w/r/r. Normal respiratory effort. Abdomen: soft, ntnd Skin: macular rash left side of face Musculoskeletal: grossly normal tone BUE/BLE Psychiatric: sleeping Neurologic: unable to assess- patient lethargic           Labs on Admission:  Basic Metabolic Panel:  Recent Labs Lab 07/24/13 1120 07/25/13 0358 07/25/13 0407  NA 138 142 138  K 4.3 4.1 4.6  CL 96 95* 96  CO2 32 29  --   GLUCOSE 184* 288* 293*  BUN 17 20 29*  CREATININE 0.70 0.73 0.90  CALCIUM 10.6* 10.8*  --    Liver Function Tests:  Recent Labs Lab 07/24/13 1120 07/25/13 0358  AST 13 13  ALT 9 10  ALKPHOS 76 81  BILITOT 0.7 0.7  PROT 7.5 7.7  ALBUMIN 3.9 4.1   No results found for this basename: LIPASE, AMYLASE,  in the last 168 hours  Recent Labs Lab 07/25/13 0605  AMMONIA 24   CBC:  Recent Labs Lab 07/24/13 1120 07/25/13 0358 07/25/13 0407  WBC 14.8* 21.4*  --   NEUTROABS  --  18.3*  --   HGB 13.3 14.0 15.6*  HCT 39.5 41.1 46.0  MCV 88.2 88.8  --   PLT 552* 543*  --    Cardiac Enzymes:  Recent Labs Lab 07/25/13 0359  TROPONINI <0.30    BNP (last 3 results) No results found for this basename: PROBNP,  in the last 8760 hours CBG:  Recent Labs Lab 07/24/13 1202 07/25/13 0414  GLUCAP 180* 272*    Radiological Exams on Admission: Ct Head Wo Contrast  07/25/2013   CLINICAL DATA:  Stroke  EXAM: CT HEAD WITHOUT CONTRAST  TECHNIQUE: Contiguous axial images were obtained from the base of the skull through the vertex without intravenous contrast.  COMPARISON:  MRI and CT No new large vessel territory infarct identified. No acute intracranial hemorrhage. No mass or midline shift. No extra-axial fluid collection. Mild chronic microvascular ischemic changes again noted.  Calvarium is intact. Orbits are within normal limits. Paranasal sinuses and mastoid air cells are clear. From 07/24/2013  FINDINGS: Vague hypodensity seen in region of previously identified ischemic infarct within the left frontal lobe is again seen.  IMPRESSION: Similar size and distribution of subacute left frontal lobe ischemic infarct. No new large vessel territory infarct or intracranial hemorrhage identified.  Mild chronic  microvascular ischemic disease, unchanged.   Electronically Signed   By: [name] M.D.   On: 07/25/2013 04:21   Ct Head Wo Contrast  07/24/2013   CLINICAL DATA:  Slurred speech and 9 ankle date and mental status change for preceding 3 days  EXAM: CT HEAD WITHOUT CONTRAST  TECHNIQUE: Contiguous axial images were obtained from the base of the skull through the vertex without intravenous contrast.  COMPARISON:  MRI of the brain dated June 27, 2013  FINDINGS: The ventricles are normal in size and position. There is subtle decreased density in the deep white matter of the left frontal lobe consistent with known recent infarcts. I Walker not see evidence of an  acute intracranial hemorrhage. No abnormality in the right cerebral hemisphere is demonstrated. The cerebellum and brainstem exhibit no acute abnormalities.  At bone window settings there is no lytic nor blastic lesion or evidence of a skull fracture. The observed portions of the paranasal sinuses and mastoid air cells are clear.  IMPRESSION: There is no evidence of an acute ischemic or hemorrhagic infarction. There are ongoing changes in the left frontal lobe consistent with the recent ischemic event. If there are strong clinical concerns of new ischemic symptoms, MRI now would be useful.   Electronically Signed   By: David  Martinique   On: 07/24/2013 13:03   Mr Brain Wo Contrast  07/24/2013   CLINICAL DATA:  aphasia.  Gait disturbance.  Confusion.  EXAM: MRI HEAD WITHOUT CONTRAST  TECHNIQUE: Multiplanar, multiecho pulse sequences of the brain and surrounding structures were obtained without intravenous contrast.  COMPARISON:  Head CT same day.  MRI 06/27/2013.  FINDINGS: Diffusion imaging does not show any new or acute insult. There is minimal residual restricted diffusion at the site of infarction which was acute in December in the left posterior frontal cortical and subcortical brain.  The brainstem and cerebellum are normal. There are mild chronic  small-vessel changes affecting the cerebral hemispheric white matter. No mass lesion, hemorrhage, hydrocephalus or extra-axial collection. No pituitary mass. No significant sinus disease. No skull or skullbase lesion.  IMPRESSION: No new infarction when compared to the study of December. Some persistent residual restricted diffusion in the area of infarction in the left posterior frontal region which was acute in December.  Mild chronic small-vessel changes elsewhere affecting the cerebral hemispheric white matter.   Electronically Signed   By: Nelson Chimes M.D.   On: 07/24/2013 16:38    EKG: Independently reviewed. Sinus tach  Assessment/Plan Active Problems:   HTN (hypertension)   Leukocytosis   Type II or unspecified type diabetes mellitus with neurological manifestations, not stated as uncontrolled(250.60)   Aphasia due to recent cerebral infarction   Carotid artery stenosis   Delirium   N&V (nausea and vomiting)   Diarrhea   1. Fever/leukocytosis- possible meningitis s/p LP; vanc, rocephin, azithromycin, monitor in SDU, PRN tylenol suppository, no sign of UTI/PNA; will r/o flu 2. N/V/D- r/o c diff 3. DM- SSI 4. Tobacco abuse- cessation 5. delerium- see #1; still sedate after medications 6. Recent CEA after Carotrid artery stenosis 7. HTN- PRN IV meds while NPO 8. Recent CVA  Neurology  Patient is critically ill > 90 minutes spent talking with family and examining patient   Code Status: full Family Communication: daughter on cell phone Disposition Plan:   Time spent: 75 min  Eulogio Bear [facility] Pager [phone]

## 2013-07-25 NOTE — ED Notes (Signed)
Leonel Ramsay, MD at bedside.

## 2013-07-25 NOTE — ED Notes (Signed)
Pt again incontinent of stool. This RN performed pericare to clean up the pt. Pt is still resting, breathing unlabored.

## 2013-07-25 NOTE — ED Notes (Signed)
Report per EMS. Pt seen this evening for aphasia and discharged home. Pt tearful upon arrival. Pt is non-verbal and unable to answer questions. Trinda Pascal, MDs at bridge upon arrival, sent to CT. EMS reports 3 episodes of projectile vomiting at home and started having diarrhea en route to hospital. Last seen normal is unkown. Family reports that the pt woke up around 0230, and would not answer their questions. Pt has hx of previous stroke, unsure which side is affected. EMS noticed right-sided facial droop en route.

## 2013-07-25 NOTE — ED Provider Notes (Signed)
CSN: [id number]     Arrival date & time 07/25/13  0355 History   First MD Initiated Contact with Patient 07/25/13 0424     Chief Complaint  Patient presents with  . Altered Mental Status  . Emesis  . Diarrhea   (Consider location/radiation/quality/duration/timing/severity/associated sxs/prior Treatment) HPI Comments: [name] is a 73 y.o. female who is brought in tonight for evaluation of vomiting and diarrhea. The symptoms started suddenly at about midnight, tonight. She had been evaluated earlier in the day for difficulty speaking. She recently had a carotid endarterectomy and was discharged about a week ago. She had a comprehensive evaluation, on 07/24/13, and then was discharged home. The patient cannot contribute to her history. The patient's daughter, who lives in her home is the primary historian. There been no other difficulties this week. She is reportedly taking all her medications, as prescribed.   Level 5 caveat- altered mental status  Patient is a 73 y.o. female presenting with altered mental status, vomiting, and diarrhea. The history is provided by the patient, a relative and the spouse.  Altered Mental Status Associated symptoms: vomiting   Emesis Associated symptoms: diarrhea   Diarrhea Associated symptoms: vomiting     Past Medical History  Diagnosis Date  . Neuropathy, diabetic   . Dyslipidemia     takes Crestor daily  . Smoker   . Hypertension     takes Prinzide and Verapamil daily  . Diabetes mellitus     takes Janumet daily  . PONV (postoperative nausea and vomiting)   . Impaired speech     from stroke  . Vertigo     but doesn't take any meds  . Stroke 06/25/13   Past Surgical History  Procedure Laterality Date  . Cholecystectomy    . Knee surgery Left 86yrs ago  . Cataract removed Right   . Endarterectomy Left 07/14/2013    Procedure: ENDARTERECTOMY CAROTID-LEFT;  Surgeon: Elam Dutch, MD;  Location: [place];  Service: Vascular;   Laterality: Left;  . Patch angioplasty Left 07/14/2013    Procedure: LEFT CAROTID ARTERY PATCH ANGIOPLASTY;  Surgeon: Elam Dutch, MD;  Location: [place];  Service: Vascular;  Laterality: Left;   No family history on file. History  Substance Use Topics  . Smoking status: Current Every Day Smoker  . Smokeless tobacco: Not on file     Comment: hasn't smoked since Sat,Dec 13  . Alcohol Use: No   OB History   Grav Para Term Preterm Abortions TAB SAB Ect Mult Living                 Review of Systems  Gastrointestinal: Positive for vomiting and diarrhea.  All other systems reviewed and are negative.    Allergies  Codeine and Lipitor  Home Medications   Current Outpatient Rx  Name  Route  Sig  Dispense  Refill  . aspirin EC 325 MG EC tablet   Oral   Take 1 tablet (325 mg total) by mouth daily.   30 tablet   0   . lisinopril-hydrochlorothiazide (PRINZIDE,ZESTORETIC) 20-12.5 MG per tablet   Oral   Take 1 tablet by mouth daily.         . rosuvastatin (CRESTOR) 20 MG tablet   Oral   Take 1 tablet (20 mg total) by mouth daily.   30 tablet   11   . sitaGLIPtin-metformin (JANUMET) 50-1000 MG per tablet   Oral   Take 1 tablet by mouth 2 (two)  times daily with a meal.   60 tablet   5   . traMADol (ULTRAM) 50 MG tablet   Oral   Take 1 tablet (50 mg total) by mouth every 6 (six) hours as needed.   20 tablet   0   . verapamil (CALAN-SR) 240 MG CR tablet   Oral   Take 240 mg by mouth daily.          BP 186/61  Pulse 114  Temp(Src) 99.9 F (37.7 C) (Rectal)  Resp 23  Wt 148 lb (67.132 kg)  SpO2 100% Physical Exam  Nursing note and vitals reviewed. Constitutional: She appears well-developed. She appears distressed.  Elderly, frail. He is sedated after the initial treatment, ordered by Dr. Leonel Ramsay.  HENT:  Head: Normocephalic and atraumatic.  Eyes: Conjunctivae and EOM are normal. Pupils are equal, round, and reactive to light.  Neck: Normal range of  motion and phonation normal. Neck supple.  Cardiovascular: Normal rate, regular rhythm and intact distal pulses.   Pulmonary/Chest: Effort normal and breath sounds normal. She exhibits no tenderness.  Abdominal: Soft. She exhibits no distension. There is no tenderness. There is no guarding.  Musculoskeletal: Normal range of motion.  Neurological: She exhibits normal muscle tone.  She is sedated and did not speak during the exam  Skin: Skin is warm and dry.  Flat red rash, primarily on the upper back, but extending to the neck anteriorly. The rash is macular and there is no raised component or petechial component.    ED Course  Procedures (including critical care time)  Medications  cefTRIAXone (ROCEPHIN) 2 g in dextrose 5 % 50 mL IVPB (not administered)  vancomycin (VANCOCIN) 1,500 mg in sodium chloride 0.9 % 500 mL IVPB (1,500 mg Intravenous New Bag/Given 07/25/13 0550)  acyclovir (ZOVIRAX) 670 mg in dextrose 5 % 100 mL IVPB (not administered)  ondansetron (ZOFRAN) injection 4 mg (4 mg Intravenous Given 07/25/13 0447)  LORazepam (ATIVAN) injection 1 mg (1 mg Intravenous Given 07/25/13 0453)  LORazepam (ATIVAN) injection 1 mg (1 mg Intravenous Given 07/25/13 0500)  haloperidol lactate (HALDOL) injection 2.5 mg (2.5 mg Intravenous Given 07/25/13 0511)    Patient Vitals for the past 24 hrs:  BP Temp Temp src Pulse Resp SpO2 Weight  07/25/13 0555 - - - - - - 148 lb (67.132 kg)  07/25/13 0540 186/61 mmHg - - 114 23 100 % -  07/25/13 0535 157/61 mmHg - - 114 31 100 % -  07/25/13 0531 157/64 mmHg - - 111 24 99 % -  07/25/13 0530 157/64 mmHg - - 112 32 98 % -  07/25/13 0529 123/72 mmHg - - - 28 100 % -  07/25/13 0520 182/67 mmHg - - 120 31 92 % -  07/25/13 0515 173/77 mmHg - - 118 32 96 % -  07/25/13 0419 153/108 mmHg 99.9 F (37.7 C) Rectal 117 22 99 % -  07/25/13 0415 - - - - - 98 % -    The patient was initially managed by the stroke physician, Dr. Leonel Ramsay. He did a lumbar  puncture,. He ordered antibiotics. He was at the patient has an acute delirium. He would like her admitted by a hospitalist.  6:07 AM-Consult complete with [facility] Patient case explained and discussed. She agrees to admit patient for further evaluation and treatment. Call ended at 07:40   Labs Review Labs Reviewed  CBC - Abnormal; Notable for the following:    WBC 21.4 (*)    Platelets  543 (*)    All other components within normal limits  DIFFERENTIAL - Abnormal; Notable for the following:    Neutrophils Relative % 86 (*)    Neutro Abs 18.3 (*)    Lymphocytes Relative 10 (*)    All other components within normal limits  COMPREHENSIVE METABOLIC PANEL - Abnormal; Notable for the following:    Chloride 95 (*)    Glucose, Bld 288 (*)    Calcium 10.8 (*)    GFR calc non Af Amer 83 (*)    All other components within normal limits  GLUCOSE, CAPILLARY - Abnormal; Notable for the following:    Glucose-Capillary 272 (*)    All other components within normal limits  POCT I-STAT, CHEM 8 - Abnormal; Notable for the following:    BUN 29 (*)    Glucose, Bld 293 (*)    Hemoglobin 15.6 (*)    All other components within normal limits  CSF CULTURE  GRAM STAIN  ETHANOL  PROTIME-INR  APTT  TROPONIN I  URINE RAPID DRUG SCREEN (HOSP PERFORMED)  URINALYSIS, ROUTINE W REFLEX MICROSCOPIC  CSF CELL COUNT WITH DIFFERENTIAL  PROTEIN AND GLUCOSE, CSF  HERPES SIMPLEX VIRUS(HSV) DNA BY PCR  AMMONIA  TSH  POCT I-STAT TROPONIN I   Imaging Review Ct Head Wo Contrast  07/25/2013   CLINICAL DATA:  Stroke  EXAM: CT HEAD WITHOUT CONTRAST  TECHNIQUE: Contiguous axial images were obtained from the base of the skull through the vertex without intravenous contrast.  COMPARISON:  MRI and CT No new large vessel territory infarct identified. No acute intracranial hemorrhage. No mass or midline shift. No extra-axial fluid collection. Mild chronic microvascular ischemic changes again noted.  Calvarium is intact.  Orbits are within normal limits. Paranasal sinuses and mastoid air cells are clear. From 07/24/2013  FINDINGS: Vague hypodensity seen in region of previously identified ischemic infarct within the left frontal lobe is again seen.  IMPRESSION: Similar size and distribution of subacute left frontal lobe ischemic infarct. No new large vessel territory infarct or intracranial hemorrhage identified.  Mild chronic microvascular ischemic disease, unchanged.   Electronically Signed   By: Jeannine Boga M.D.   On: 07/25/2013 04:21   Ct Head Wo Contrast  07/24/2013   CLINICAL DATA:  Slurred speech and 9 ankle date and mental status change for preceding 3 days  EXAM: CT HEAD WITHOUT CONTRAST  TECHNIQUE: Contiguous axial images were obtained from the base of the skull through the vertex without intravenous contrast.  COMPARISON:  MRI of the brain dated June 27, 2013  FINDINGS: The ventricles are normal in size and position. There is subtle decreased density in the deep white matter of the left frontal lobe consistent with known recent infarcts. I do not see evidence of an acute intracranial hemorrhage. No abnormality in the right cerebral hemisphere is demonstrated. The cerebellum and brainstem exhibit no acute abnormalities.  At bone window settings there is no lytic nor blastic lesion or evidence of a skull fracture. The observed portions of the paranasal sinuses and mastoid air cells are clear.  IMPRESSION: There is no evidence of an acute ischemic or hemorrhagic infarction. There are ongoing changes in the left frontal lobe consistent with the recent ischemic event. If there are strong clinical concerns of new ischemic symptoms, MRI now would be useful.   Electronically Signed   By: David  Martinique   On: 07/24/2013 13:03   Mr Brain Wo Contrast  07/24/2013   CLINICAL DATA:  aphasia.  Gait disturbance.  Confusion.  EXAM: MRI HEAD WITHOUT CONTRAST  TECHNIQUE: Multiplanar, multiecho pulse sequences of the brain and  surrounding structures were obtained without intravenous contrast.  COMPARISON:  Head CT same day.  MRI 06/27/2013.  FINDINGS: Diffusion imaging does not show any new or acute insult. There is minimal residual restricted diffusion at the site of infarction which was acute in December in the left posterior frontal cortical and subcortical brain.  The brainstem and cerebellum are normal. There are mild chronic small-vessel changes affecting the cerebral hemispheric white matter. No mass lesion, hemorrhage, hydrocephalus or extra-axial collection. No pituitary mass. No significant sinus disease. No skull or skullbase lesion.  IMPRESSION: No new infarction when compared to the study of December. Some persistent residual restricted diffusion in the area of infarction in the left posterior frontal region which was acute in December.  Mild chronic small-vessel changes elsewhere affecting the cerebral hemispheric white matter.   Electronically Signed   By: Nelson Chimes M.D.   On: 07/24/2013 16:38    EKG Interpretation    Date/Time:  Saturday July 25 2013 04:47:04 EST Ventricular Rate:  109 PR Interval:  199 QRS Duration: 133 QT Interval:  333 QTC Calculation: 448 R Axis:   68 Text Interpretation:  Sinus tachycardia Right atrial enlargement IVCD, consider atypical RBBB ST depr, consider ischemia, anterior leads since last tracing no significant change Confirmed by Arica Bevilacqua  MD, Kissie Ziolkowski ([id number]) on 07/25/2013 6:05:55 AM            MDM   1. Aphasia due to recent cerebral infarction   2. Delirium   3. Vomiting and diarrhea      Unspecified delirium following carotid endarterectomy. She is being treated for possible meningitis.  Plan: Admit Nursing Notes Reviewed/ Care Coordinated Applicable Imaging Reviewed Interpretation of Laboratory Data incorporated into ED treatment    Richarda Blade, MD 07/25/13 1424

## 2013-07-25 NOTE — Consult Note (Signed)
Neurology Consultation Reason for Consult: Altered Mental Status Referring Physician: Eulis Foster, E  CC: Altered Mental Status  History is obtained from: Daughter, Husband  HPI: [name] is a 73 y.o. female with progressive confusion over the past 2 -3 days. She was initially noted to be slightly confused on Tuesday and since then has been progressively getting worse. She was seen in the ED and evaluated for this, where she had an MRI which showed no new signs of stroke. Tonight, she has gotten progressively worse and developed nausea/vomiting.   Around 1am, she was noted to no longer be answering questions and therefore EMS was called.   She had a recent stroke with some mild aphasia. Repeat MRI earlier today with no worsening of ischemia.   LKW: unclear but at least 2 -3 days, recent stroke in December tpa given?: no, outside of window, recent stroke  She developed a macular rash prior to being brought to the ER tonight.     ROS: Unable to assess secondary to patient's altered mental status.    Past Medical History  Diagnosis Date  . Neuropathy, diabetic   . Dyslipidemia     takes Crestor daily  . Smoker   . Hypertension     takes Prinzide and Verapamil daily  . Diabetes mellitus     takes Janumet daily  . PONV (postoperative nausea and vomiting)   . Impaired speech     from stroke  . Vertigo     but doesn't take any meds  . Stroke 06/25/13    Family History: Unable to assess secondary to patient's altered mental status.    Social History: Tob: Unable to assess secondary to patient's altered mental status.    Exam: Current vital signs: BP 153/108  Pulse 117  Temp(Src) 99.9 F (37.7 C) (Rectal)  Resp 22  SpO2 99% Vital signs in last 24 hours: Temp:  [97.9 F (36.6 C)-99.9 F (37.7 C)] 99.9 F (37.7 C) (01/10 0419) Pulse Rate:  [70-117] 117 (01/10 0419) Resp:  [13-22] 22 (01/10 0419) BP: (150-187)/(63-108) 153/108 mmHg (01/10 0419) SpO2:  [94 %-99 %]  99 % (01/10 0419) Weight:  [67.132 kg (148 lb)] 67.132 kg (148 lb) (01/09 0940)  General: in bed, on left side appears nauseated CV: tachycardic Skin: macular rash Mental Status: Patient keeps eyes tightly shut, does not follow commands or answer questions.  She  Cranial Nerves: II: Does nto clearly blink to threat. Pupils are equal, round, and reactive to light.  Discs are difficult to visualize due ot patient non-cooperation. III,IV, VI: does not clearly fixate or track, but eyes are midline.  V: blinks to eyelid stim VII: Face grossly symmetric VIII, X, XI, XII: Unable to assess secondary to patient's altered mental status.  Motor: Tone is normal. Bulk is normal. She moves all 4 extremities purposefully and spontaneously.  Sensory: Responds to nox stim x 4.  Deep Tendon Reflexes: 1+ and symmetric in the biceps and patellae.  Cerebellar: Unable to assess secondary to patient's altered mental status.  Gait: Unable to assess secondary to patient's altered mental status.    I have reviewed labs in epic and the results pertinent to this consultation are: Leukocytosis, 21 up from 14 earlier today.   I have reviewed the images obtained:MRI brain - previous stroke identified.   Impression: [name] with worsening mental status, nausea, stool incontinence in the setting of recent stroke. Not a candidate for tpa given recent stroke, but my suspicion is  low for new ischemia. Delirium secondary to gastroenteritis is a possibility, as is secondary to other infection. CNS infection must be considered given AMS and no history of dementia and therefore LP will be performed.   Recommendations: 1) LP with cell count/diff, protein, glucose, hsv by pcr 2) Will order empiric coverage pending LP studies.  3) Continue ASA for secondary stroke prevention.  4) EEG 5) could repeat MRI if AMS persists and no other cause found 6) Ammonia, tsh, uds  Roland Rack, MD [facility] [phone]  If 7pm- 7am, please page neurology on call at [phone].

## 2013-07-25 NOTE — ED Notes (Signed)
CBG taken = 272.

## 2013-07-25 NOTE — Progress Notes (Signed)
eLink Physician-Brief Progress Note Patient Name: [name] DOB: Sep 12, 1940 MRN: [id number]  Date of Service  07/25/2013   HPI/Events of Note   Pos trop  eICU Interventions  aspirin   Intervention Category Intermediate Interventions: Diagnostic test evaluation  ALVA,RAKESH V. 07/25/2013, 10:42 PM

## 2013-07-25 NOTE — Procedures (Signed)
ELECTROENCEPHALOGRAM REPORT   Patient: [name]       Room #: ED-06 EEG No. ID: [id number] Age: 73 y.o.        Sex: female Referring Physician: Eliseo Squires Report Date:  07/25/2013        Interpreting Physician: Anthony Sar  History: [name] is an 73 y.o. female with progressively worsening confusion for several days. Lumbar puncture showed no signs of acute meningitis.  Indications for study:  Assess severity of encephalopathy; rule out seizure activity.  Technique: This is an 18 channel routine scalp EEG performed at the bedside with bipolar and monopolar montages arranged in accordance to the international 10/20 system of electrode placement.   Description: This EEG recording was performed during wakefulness. Background activity consists of low amplitude 1-2 Hz diffuse irregular delta activity with superimposed 4-7 Hz irregular theta activity diffusely. Photic stimulation was not performed. Hyperventilation was not performed. No epileptiform discharges were recorded.  Interpretation: EEG is abnormal with mild to moderate generalized continuous nonspecific slowing of cerebral activity. This pattern of slowing can be seen with a wide variety of etiologies including metabolic as well as degenerative CNS disorders. No evidence of an epileptic disorder was demonstrated.   Rush Farmer M.D. [facility] [phone]

## 2013-07-25 NOTE — Progress Notes (Addendum)
Patient's respiratory rate has increased and HR as well, will get stat lactic acid and ABG and ask critical care to see about possible ICU placement c-diff positive- added IV flagyl  Eulogio Bear DO

## 2013-07-25 NOTE — ED Notes (Signed)
EEG Tech at bedside.

## 2013-07-25 NOTE — ED Notes (Signed)
[facility] reports that Vancomycin and Zovirax are compatible, and will be run through the same IV.

## 2013-07-25 NOTE — ED Notes (Signed)
Pt is asleep, breathing is easy and unlabored.

## 2013-07-26 DIAGNOSIS — J9600 Acute respiratory failure, unspecified whether with hypoxia or hypercapnia: Secondary | ICD-10-CM

## 2013-07-26 DIAGNOSIS — I10 Essential (primary) hypertension: Secondary | ICD-10-CM

## 2013-07-26 DIAGNOSIS — I69920 Aphasia following unspecified cerebrovascular disease: Secondary | ICD-10-CM

## 2013-07-26 DIAGNOSIS — A0472 Enterocolitis due to Clostridium difficile, not specified as recurrent: Secondary | ICD-10-CM

## 2013-07-26 LAB — PROCALCITONIN

## 2013-07-26 LAB — URINE CULTURE
CULTURE: NO GROWTH
Colony Count: NO GROWTH

## 2013-07-26 LAB — COMPREHENSIVE METABOLIC PANEL
ALBUMIN: 2.6 g/dL — AB (ref 3.5–5.2)
ALT: 6 U/L (ref 0–35)
AST: 11 U/L (ref 0–37)
Alkaline Phosphatase: 52 U/L (ref 39–117)
BUN: 20 mg/dL (ref 6–23)
CO2: 23 mEq/L (ref 19–32)
CREATININE: 0.7 mg/dL (ref 0.50–1.10)
Calcium: 7.9 mg/dL — ABNORMAL LOW (ref 8.4–10.5)
Chloride: 108 mEq/L (ref 96–112)
GFR calc Af Amer: >90 mL/min (ref >90)
GFR calc non Af Amer: 85 mL/min — ABNORMAL LOW (ref >90)
Glucose, Bld: 164 mg/dL — ABNORMAL HIGH (ref 70–99)
Potassium: 3.1 mEq/L — ABNORMAL LOW (ref 3.7–5.3)
Sodium: 144 mEq/L (ref 137–147)
Total Bilirubin: 0.6 mg/dL (ref 0.3–1.2)
Total Protein: 5.3 g/dL — ABNORMAL LOW (ref 6.0–8.3)

## 2013-07-26 LAB — CBC
HCT: 33.2 % — ABNORMAL LOW (ref 36.0–46.0)
Hemoglobin: 11.1 g/dL — ABNORMAL LOW (ref 12.0–15.0)
MCH: 29.8 pg (ref 26.0–34.0)
MCHC: 33.4 g/dL (ref 30.0–36.0)
MCV: 89 fL (ref 78.0–100.0)
PLATELETS: 421 10*3/uL — AB (ref 150–400)
RBC: 3.73 MIL/uL — AB (ref 3.87–5.11)
RDW: 14.7 % (ref 11.5–15.5)
WBC: 20.5 10*3/uL — AB (ref 4.0–10.5)

## 2013-07-26 LAB — GLUCOSE, CAPILLARY
GLUCOSE-CAPILLARY: 116 mg/dL — AB (ref 70–99)
GLUCOSE-CAPILLARY: 129 mg/dL — AB (ref 70–99)
GLUCOSE-CAPILLARY: 136 mg/dL — AB (ref 70–99)
Glucose-Capillary: 152 mg/dL — ABNORMAL HIGH (ref 70–99)
Glucose-Capillary: 147 mg/dL — ABNORMAL HIGH (ref 70–99)
Glucose-Capillary: 154 mg/dL — ABNORMAL HIGH (ref 70–99)

## 2013-07-26 LAB — MAGNESIUM: Magnesium: 1.3 mg/dL — ABNORMAL LOW (ref 1.5–2.5)

## 2013-07-26 LAB — TROPONIN I: Troponin I: <0.3 ng/mL (ref <0.30)

## 2013-07-26 MED ORDER — LABETALOL HCL 5 MG/ML IV SOLN
10.0000 mg | INTRAVENOUS | Status: DC | PRN
  Administered 2013-07-26: 10 mg via INTRAVENOUS
  Filled 2013-07-26: qty 4

## 2013-07-26 MED ORDER — MAGNESIUM SULFATE 40 MG/ML IJ SOLN
4.0000 g | Freq: Once | INTRAMUSCULAR | Status: AC
  Administered 2013-07-26: 4 g via INTRAVENOUS
  Filled 2013-07-26: qty 100

## 2013-07-26 MED ORDER — POTASSIUM CHLORIDE 20 MEQ/15ML (10%) PO LIQD
40.0000 meq | ORAL | Status: AC
  Administered 2013-07-26 (×2): 40 meq
  Filled 2013-07-26 (×2): qty 30

## 2013-07-26 NOTE — Progress Notes (Signed)
Name: [name] MRN: [id number] DOB: [birth year]    ADMISSION DATE:  07/25/2013 CONSULTATION DATE:  1/10  REFERRING MD :  Eliseo Squires PRIMARY SERVICE:  [facility]   CHIEF COMPLAINT:  Sepsis/occult shock/acute encephalopathy   BRIEF PATIENT DESCRIPTION:  This is a 73 year old female w/ recent h/o CVA,  presented to ER 1/10 w/ progressive MS change & diarrhea. Clinically deteriorated while in ER, intubated, C dif positive.  SIGNIFICANT EVENTS: 1/09 Seen in ER w/ 72 hrs of AMS. Seen by EDP, MRI done neg for new stroke. 1/10 presents again w/ AMS, diarrhea and sepsis criteria. Seen by Neuro EEG done: c/w metabolic encephalopathy, LP done: not c/w meningitis. PCCM asked to see as pt clinically worse.   STUDIES:  1/09 CT head >> subacute Lt frontal stroke 1/09 MRI brain >> subacute Lt frontal stroke 1/10 CT head >> subacute Lt frontal stroke 1/10 LP >> glucose 132, protein 47, RBC 2, WBC 1  LINES / TUBES: ETT 1/10 >> Rt IJ CVL 1/10 >>  CULTURES: Cdiff 1/10: positive  bcx2 1/10>>> CSF 1/10: no orgs  ANTIBIOTICS: vanc 1/10>>>1/10 Oral vanc 1/10>>> Flagyl 1/10>>> Rocephin 1/10>>>1/10  Subjective: Mild pos troponin overnight, ASA started, on vent still confused  VITAL SIGNS: Temp:  [98.3 F (36.8 C)-103.2 F (39.6 C)] 98.3 F (36.8 C) (01/11 0727) Pulse Rate:  [71-135] 99 (01/11 0700) Resp:  [0-44] 17 (01/11 0700) BP: (91-185)/(44-120) 139/84 mmHg (01/11 0700) SpO2:  [94 %-100 %] 100 % (01/11 0700) FiO2 (%):  [30 %-100 %] 30 % (01/11 0600) Weight:  [151 lb 0.2 oz (68.5 kg)-153 lb 3.5 oz (69.5 kg)] 153 lb 3.5 oz (69.5 kg) (01/11 0315)  I/O: I/O last 3 completed shifts: In: 2487 [I.V.:2007; NG/GT:180; IV Piggyback:300] Out: 630 [Urine:630]    PHYSICAL EXAMINATION: General: acutely ill appearing white female, opens eyes but does not localize Neuro:  Non-verbal, localizes but does not f/c  HEENT: PERRLA, dentition is poor  Cardiovascular:  Mild tachy, sinus Lungs:   Clear, sync with vent  Abdomen:  Tender to palp, hypoactive Musculoskeletal:  Intact  Skin:  Dry w/ diffuse macular erythremic rash    Recent Labs Lab 07/24/13 1120 07/25/13 0358 07/25/13 0407 07/26/13 0350  NA 138 142 138 144  K 4.3 4.1 4.6 3.1*  CL 96 95* 96 108  CO2 32 29  --  23  BUN 17 20 29* 20  CREATININE 0.70 0.73 0.90 0.70  GLUCOSE 184* 288* 293* 164*    Recent Labs Lab 07/24/13 1120 07/25/13 0358 07/25/13 0407 07/26/13 0350  HGB 13.3 14.0 15.6* 11.1*  HCT 39.5 41.1 46.0 33.2*  WBC 14.8* 21.4*  --  20.5*  PLT 552* 543*  --  421*   Ct Head Wo Contrast  07/25/2013   CLINICAL DATA:  Stroke  EXAM: CT HEAD WITHOUT CONTRAST  TECHNIQUE: Contiguous axial images were obtained from the base of the skull through the vertex without intravenous contrast.  COMPARISON:  MRI and CT No new large vessel territory infarct identified. No acute intracranial hemorrhage. No mass or midline shift. No extra-axial fluid collection. Mild chronic microvascular ischemic changes again noted.  Calvarium is intact. Orbits are within normal limits. Paranasal sinuses and mastoid air cells are clear. From 07/24/2013  FINDINGS: Vague hypodensity seen in region of previously identified ischemic infarct within the left frontal lobe is again seen.  IMPRESSION: Similar size and distribution of subacute left frontal lobe ischemic infarct. No new large vessel territory infarct or intracranial  hemorrhage identified.  Mild chronic microvascular ischemic disease, unchanged.   Electronically Signed   By: Jeannine Boga M.D.   On: 07/25/2013 04:21   Ct Head Wo Contrast  07/24/2013   CLINICAL DATA:  Slurred speech and 9 ankle date and mental status change for preceding 3 days  EXAM: CT HEAD WITHOUT CONTRAST  TECHNIQUE: Contiguous axial images were obtained from the base of the skull through the vertex without intravenous contrast.  COMPARISON:  MRI of the brain dated June 27, 2013  FINDINGS: The ventricles  are normal in size and position. There is subtle decreased density in the deep white matter of the left frontal lobe consistent with known recent infarcts. I do not see evidence of an acute intracranial hemorrhage. No abnormality in the right cerebral hemisphere is demonstrated. The cerebellum and brainstem exhibit no acute abnormalities.  At bone window settings there is no lytic nor blastic lesion or evidence of a skull fracture. The observed portions of the paranasal sinuses and mastoid air cells are clear.  IMPRESSION: There is no evidence of an acute ischemic or hemorrhagic infarction. There are ongoing changes in the left frontal lobe consistent with the recent ischemic event. If there are strong clinical concerns of new ischemic symptoms, MRI now would be useful.   Electronically Signed   By: David  Martinique   On: 07/24/2013 13:03   Mr Brain Wo Contrast  07/24/2013   CLINICAL DATA:  aphasia.  Gait disturbance.  Confusion.  EXAM: MRI HEAD WITHOUT CONTRAST  TECHNIQUE: Multiplanar, multiecho pulse sequences of the brain and surrounding structures were obtained without intravenous contrast.  COMPARISON:  Head CT same day.  MRI 06/27/2013.  FINDINGS: Diffusion imaging does not show any new or acute insult. There is minimal residual restricted diffusion at the site of infarction which was acute in December in the left posterior frontal cortical and subcortical brain.  The brainstem and cerebellum are normal. There are mild chronic small-vessel changes affecting the cerebral hemispheric white matter. No mass lesion, hemorrhage, hydrocephalus or extra-axial collection. No pituitary mass. No significant sinus disease. No skull or skullbase lesion.  IMPRESSION: No new infarction when compared to the study of December. Some persistent residual restricted diffusion in the area of infarction in the left posterior frontal region which was acute in December.  Mild chronic small-vessel changes elsewhere affecting the  cerebral hemispheric white matter.   Electronically Signed   By: Nelson Chimes M.D.   On: 07/24/2013 16:38   Dg Chest Port 1 View  07/25/2013   CLINICAL DATA:  Right central line placement, OG tube placement  EXAM: PORTABLE CHEST - 1 VIEW  COMPARISON:  07/25/2013 at 1343 hr  FINDINGS: Lungs are clear. No pleural effusion or pneumothorax.  Endotracheal tube terminates 3.5 cm above the carina.  The heart is normal in size.  Right IJ venous catheter terminates in the lower SVC. Enteric tube courses into the stomach.  IMPRESSION: Endotracheal tube terminates 3.5 cm above the carina.  Right IJ venous catheter terminates in the lower SVC. No pneumothorax.   Electronically Signed   By: Julian Hy M.D.   On: 07/25/2013 16:30   Dg Chest Port 1 View  07/25/2013   CLINICAL DATA:  Diabetes, fever, nausea, vomiting  EXAM: PORTABLE CHEST - 1 VIEW  COMPARISON:  06/27/2013  FINDINGS: The heart size and mediastinal contours are within normal limits. Both lungs are clear. The visualized skeletal structures are unremarkable.  IMPRESSION: No active disease.   Electronically Signed  By: Daryll Brod M.D.   On: 07/25/2013 14:06    ASSESSMENT / PLAN: Pulmonary  A:Acute respiratory failure - no signs aspiration, lungs clear  P: WUA, SBT, plan cpap 5 ps 5 goal 30 min  Wean as able, hope to extubate today pcxr in am   Cardiac  A: Elevated troponins- unimpressive Severe sepsis in setting of C diff colitis, complicated by volume depletion. Lactic acid 2.2 P: No pressor needs Follow troponin, ASA daily NS 125 cc/hr with good pressures (139/82), assess cvp, may be able to reduce fluids Echo assessment  Renal A: Hypokalemia - due to diarrhea P: IVFs Replete K Trend chemistry  strict I&O likley to reduce volume, BP wnl and rising  GI A: Nausea and vomiting  Diarrhea/ C diff colitis  P: IV flagyl/ oral vanc  NPO    Heme A: Leukocytosis - see ID Hemoconcentration P: Trend CBC [place] heparin    Endocrine  A: DM S: ssi   Infectious disease  A:  C diff colitis PCXR is negative.  U/A unremarkable  CSF negative  P: C diff abx, goal 10 days avoid empiric abx  Neurology A: Acute encephalopathy >> likely related to sepsis and C diff.  No evidence for recurrent stroke, or meningitis. P: -sedation protocol while on vent rass goal 0  Today's Summary: Will try to wean vent, no pressors Lavon Paganini. Titus Mould, MD, [place] Pgr: [facility]  Pulmonary and [place] Pager: [phone]  07/26/2013, 7:43 AM Vertell Novak 7:52 AM  Ccm time 30 min  I have fully examined this patient and agree with above findings.      Lavon Paganini. Titus Mould, MD, [place] Pgr: [place] [facility]

## 2013-07-26 NOTE — Progress Notes (Signed)
eLink Physician-Brief Progress Note Patient Name: JAYONA MCCAIG DOB: [birth year] MRN: [id number]  Date of Service  07/26/2013   HPI/Events of Note  Hypokalemia   eICU Interventions  Potassium replaced   Intervention Category Intermediate Interventions: Electrolyte abnormality - evaluation and management  Maydelin Deming 07/26/2013, 5:12 AM

## 2013-07-26 NOTE — Progress Notes (Signed)
Subjective: Patient developed respiratory difficulty yesterday afternoon requiring intubation and placement on mechanical ventilation. Temperature spiked to 103.2. She's currently on Flagyl and vancomycin. She had no complaints this morning.  Objective: Current vital signs: BP 165/65  Pulse 106  Temp(Src) 98.3 F (36.8 C) (Oral)  Resp 21  Ht 5\' 4"  (1.626 m)  Wt 69.5 kg (153 lb 3.5 oz)  BMI 26.29 kg/m2  SpO2 98%  Neurologic Exam: Alert and in no acute distress. Patient is intubated and on mechanical ventilation with spontaneous respirations as well. Pupils were equal and reacted normally to light. Extraocular movements were full and conjugate. No facial weakness noted. Patient moved extremities equally with normal strength throughout. Deep tendon reflexes were trace to 1+ and symmetrical. Plantar responses were flexor bilaterally.  Medications: I have reviewed the patient's current medications.  Assessment/Plan: Marked improvement in mental status. Patient is no longer confused and agitated. Most likely etiology for mental status change was infectious with possible sepsis. She had no signs of CNS infection. CSF culture so far is negative in addition to a lack of pleocytosis.  No further neurological intervention is indicated at this point. We'll sign off on her care, but remain available for reevaluation if indicated.  C.R. Nicole Kindred, MD [facility] [phone]  07/26/2013  8:36 AM On

## 2013-07-26 NOTE — Significant Event (Signed)
Hypertensive after extubation.  Will order prn labetalol IV until she is able to resume oral medications.  Chesley Mires, MD [facility] 07/26/2013, 11:54 AM Pager:  [phone] After 3pm call: [phone]

## 2013-07-26 NOTE — Procedures (Signed)
Extubation Procedure Note  Patient Details:   Name: [name] DOB: 02-May-1941 MRN: [id number]   Airway Documentation:     Evaluation  O2 sats: stable throughout Complications: No apparent complications Patient did tolerate procedure well. Bilateral Breath Sounds: Diminished;Clear Suctioning: Airway Yes  Placed on 4L [place] and tolerating well.  Beatris Si D 07/26/2013, 12:19 PM

## 2013-07-27 ENCOUNTER — Ambulatory Visit (HOSPITAL_COMMUNITY): Payer: Medicare Other | Admitting: Speech Pathology

## 2013-07-27 DIAGNOSIS — I517 Cardiomegaly: Secondary | ICD-10-CM

## 2013-07-27 LAB — BASIC METABOLIC PANEL
BUN: 13 mg/dL (ref 6–23)
CO2: 24 meq/L (ref 19–32)
Calcium: 7.7 mg/dL — ABNORMAL LOW (ref 8.4–10.5)
Chloride: 108 mEq/L (ref 96–112)
Creatinine, Ser: 0.66 mg/dL (ref 0.50–1.10)
GFR calc Af Amer: >90 mL/min (ref >90)
GFR calc non Af Amer: 86 mL/min — ABNORMAL LOW (ref >90)
GLUCOSE: 126 mg/dL — AB (ref 70–99)
Potassium: 3.4 mEq/L — ABNORMAL LOW (ref 3.7–5.3)
SODIUM: 144 meq/L (ref 137–147)

## 2013-07-27 LAB — POCT I-STAT 3, ART BLOOD GAS (G3+)
Bicarbonate: 25.5 mEq/L — ABNORMAL HIGH (ref 20.0–24.0)
O2 Saturation: 100 %
PCO2 ART: 44.5 mmHg (ref 35.0–45.0)
PH ART: 7.367 (ref 7.350–7.450)
PO2 ART: 520 mmHg — AB (ref 80.0–100.0)
TCO2: 27 mmol/L (ref 0–100)

## 2013-07-27 LAB — HERPES SIMPLEX VIRUS(HSV) DNA BY PCR
HSV 1 DNA: NOT DETECTED
HSV 2 DNA: NOT DETECTED

## 2013-07-27 LAB — GI PATHOGEN PANEL BY PCR, STOOL
C difficile toxin A/B: POSITIVE
CRYPTOSPORIDIUM BY PCR: NEGATIVE
Campylobacter by PCR: NEGATIVE
E coli (ETEC) LT/ST: NEGATIVE
E coli (STEC): NEGATIVE
E coli 0157 by PCR: NEGATIVE
G LAMBLIA BY PCR: NEGATIVE
NOROVIRUS G1/G2: NEGATIVE
ROTAVIRUS A BY PCR: NEGATIVE
SHIGELLA BY PCR: NEGATIVE
Salmonella by PCR: NEGATIVE

## 2013-07-27 LAB — GLUCOSE, CAPILLARY
GLUCOSE-CAPILLARY: 138 mg/dL — AB (ref 70–99)
GLUCOSE-CAPILLARY: 134 mg/dL — AB (ref 70–99)
GLUCOSE-CAPILLARY: 114 mg/dL — AB (ref 70–99)
Glucose-Capillary: 126 mg/dL — ABNORMAL HIGH (ref 70–99)
Glucose-Capillary: 148 mg/dL — ABNORMAL HIGH (ref 70–99)
Glucose-Capillary: 153 mg/dL — ABNORMAL HIGH (ref 70–99)
Glucose-Capillary: 179 mg/dL — ABNORMAL HIGH (ref 70–99)

## 2013-07-27 LAB — CBC
HCT: 27.8 % — ABNORMAL LOW (ref 36.0–46.0)
HEMOGLOBIN: 9.6 g/dL — AB (ref 12.0–15.0)
MCH: 30 pg (ref 26.0–34.0)
MCHC: 34.5 g/dL (ref 30.0–36.0)
MCV: 86.9 fL (ref 78.0–100.0)
PLATELETS: 358 10*3/uL (ref 150–400)
RBC: 3.2 MIL/uL — ABNORMAL LOW (ref 3.87–5.11)
RDW: 14.8 % (ref 11.5–15.5)
WBC: 19.2 10*3/uL — AB (ref 4.0–10.5)

## 2013-07-27 LAB — PROCALCITONIN

## 2013-07-27 MED ORDER — POTASSIUM CHLORIDE 10 MEQ/50ML IV SOLN
INTRAVENOUS | Status: AC
  Administered 2013-07-27: 08:00:00 10 meq via INTRAVENOUS
  Filled 2013-07-27: qty 150

## 2013-07-27 MED ORDER — POTASSIUM CHLORIDE 10 MEQ/50ML IV SOLN
10.0000 meq | INTRAVENOUS | Status: AC
  Administered 2013-07-27 (×3): 10 meq via INTRAVENOUS

## 2013-07-27 MED ORDER — POTASSIUM CHLORIDE 20 MEQ/15ML (10%) PO LIQD
40.0000 meq | Freq: Once | ORAL | Status: AC
  Administered 2013-07-27: 40 meq via ORAL
  Filled 2013-07-27: qty 30

## 2013-07-27 MED ORDER — PROMETHAZINE HCL 25 MG/ML IJ SOLN
12.5000 mg | Freq: Once | INTRAMUSCULAR | Status: AC
  Administered 2013-07-27: 12.5 mg via INTRAVENOUS
  Filled 2013-07-27: qty 1

## 2013-07-27 MED ORDER — INSULIN ASPART 100 UNIT/ML ~~LOC~~ SOLN
0.0000 [IU] | Freq: Three times a day (TID) | SUBCUTANEOUS | Status: DC
  Administered 2013-07-27: 1 [IU] via SUBCUTANEOUS
  Administered 2013-07-27: 2 [IU] via SUBCUTANEOUS
  Administered 2013-07-28 – 2013-07-29 (×4): 1 [IU] via SUBCUTANEOUS
  Administered 2013-07-29: 2 [IU] via SUBCUTANEOUS
  Administered 2013-07-29 – 2013-07-30 (×2): 1 [IU] via SUBCUTANEOUS

## 2013-07-27 MED ORDER — SODIUM CHLORIDE 0.9 % IV SOLN
INTRAVENOUS | Status: DC
  Administered 2013-07-28: 10 mL/h via INTRAVENOUS

## 2013-07-27 NOTE — Progress Notes (Signed)
   Right IJ CVC d/c'd per order. Catheter was intact when removed. Vaseline gauze and occlusive dressing applied. Site is unremarkable with no bleeding noted. Pt, with stable VS post procedure. She tolerated the removal of the CVC well. Dressing is dry and intact.

## 2013-07-27 NOTE — Progress Notes (Signed)
UR Completed.  [name] [phone] 07/27/2013

## 2013-07-27 NOTE — Progress Notes (Signed)
Pt known to me recent right CEA.  Admitted over weekend with apparent C diff sepsis.  Rapidly improving.  Appreciate excellent care of medical and critical care teams. Incision healing well neuro exam at baseline available if questions.  Ruta Hinds, MD [facility] Office: [phone] Pager: [phone]

## 2013-07-27 NOTE — Progress Notes (Signed)
PULMONARY / CRITICAL CARE MEDICINE Name: [name] MRN: [id number] DOB: 24-Aug-1940    ADMISSION DATE:  07/25/2013 CONSULTATION DATE:  1/10  REFERRING MD :  [facility] PRIMARY SERVICE:  [facility]  CHIEF COMPLAINT:  Sepsis.  BRIEF PATIENT DESCRIPTION: 73 yo with recent CVA admitted with acute encephalopathy requiring airway and diarrhea.  SIGNIFICANT EVENTS / STUDIES 1/09  MRI brain >> subacute L frontal stroke 0/09  EEG >>> metabolic encephalopathy 3/81  CT head >> subacute L frontal stroke 1/10  LP >>> no meningitis 1/12  TTE >>>  LINES / TUBES: OETT 1/10 >>> 1/11 R IJ CVL 1/10 >>> Foley 1/10 >>>  CULTURES: 1/10  C.dif PCR >>> POSITIVE  1/10  Blood >>> 1/0    CSF >>> no organisms >>>  ANTIBIOTICS: Vancomycin 1/10 x 1 Rocephin 1/10 x 1 Oral Vancomycin 1/10 >>> Flagyl 1/10 >>>  INTERVAL HISTORY: Extubated yesterday and did well. Some vomiting and then phenergan causing delirium. Now giving appropriate answers but hallucinations overnight and still mildly delirious. 1 bowel movement of green liquid this AM.  VITAL SIGNS: Temp:  [98 F (36.7 C)-98.8 F (37.1 C)] 98 F (36.7 C) (01/12 0842) Pulse Rate:  [72-95] 72 (01/12 0700) Resp:  [16-28] 24 (01/12 0700) BP: (114-181)/(58-95) 155/58 mmHg (01/12 0700) SpO2:  [95 %-100 %] 99 % (01/12 0700) FiO2 (%):  [30 %] 30 % (01/11 1000) Weight:  [70.1 kg (154 lb 8.7 oz)] 70.1 kg (154 lb 8.7 oz) (01/12 0400)  I/O: I/O last 3 completed shifts: In: 2761.8 [I.V.:2051.8; NG/GT:210; IV [id number]:678] Out: 2486 [Urine:1860; Emesis/NG output:625; Stool:1]    PHYSICAL EXAMINATION: General: no apparent distress, trying to get out of bed, distractible Neuro:  Answers questions appropriately but mildly positive ICAM.  HEENT: PERRLA, dentition is poor  Cardiovascular:  sinus Lungs:  Clear Abdomen:  Non-tender, non-distended, +BS Musculoskeletal:  Intact  Skin:  Dry    CBC  Recent Labs Lab 07/25/13 0358 07/25/13 0407 07/26/13 0350  07/27/13 0614  WBC 21.4*  --  20.5* 19.2*  HGB 14.0 15.6* 11.1* 9.6*  HCT 41.1 46.0 33.2* 27.8*  PLT 543*  --  421* 358   Coag's  Recent Labs Lab 07/25/13 0358  APTT 24  INR 0.96   BMET  Recent Labs Lab 07/25/13 0358 07/25/13 0407 07/26/13 0350 07/27/13 0614  NA 142 138 144 144  K 4.1 4.6 3.1* 3.4*  CL 95* 96 108 108  CO2 29  --  23 24  BUN 20 29* 20 13  CREATININE 0.73 0.90 0.70 0.66  GLUCOSE 288* 293* 164* 126*   Electrolytes  Recent Labs Lab 07/25/13 0358 07/26/13 0350 07/26/13 0900 07/27/13 0614  CALCIUM 10.8* 7.9*  --  7.7*  MG  --   --  1.3*  --    Sepsis Markers  Recent Labs Lab 07/25/13 1316 07/25/13 1517 07/26/13 0350 07/27/13 0400  LATICACIDVEN 2.2  --   --   --   PROCALCITON  --  <0.10 <0.10 <0.10   ABG  Recent Labs Lab 07/25/13 1344 07/25/13 1756  PHART 7.351 7.367  PCO2ART 45.0 44.5  PO2ART 102.0* 520.0*   Liver Enzymes  Recent Labs Lab 07/24/13 1120 07/25/13 0358 07/26/13 0350  AST 13 13 11   ALT 9 10 6   ALKPHOS 76 81 52  BILITOT 0.7 0.7 0.6  ALBUMIN 3.9 4.1 2.6*   Cardiac Enzymes  Recent Labs Lab 07/25/13 1517 07/25/13 2143 07/26/13 0350  TROPONINI <0.30 0.33* <0.30   Glucose  Recent  Labs Lab 07/26/13 1450 07/26/13 1920 07/26/13 2346 07/27/13 0333 07/27/13 0834 07/27/13 1103  GLUCAP 136* 116* 148* 138* 134* 114*    CXR:  None today.  ASSESSMENT / PLAN:  PULMONARY A: Acute respiratory failure in setting of emesis and acute encephalopathy - resolved. P: Monitor clinically  CARDIAC A:  Mild troponinemia, doubt ACS. Severe sepsis in setting of C diff colitis, complicated by volume depletion. Lactic acid ( 2.2) reassuring. P: ASA daily TTE D/c CVL  RENAL A: Hypokalemia secondary to diarrhea. Hypomagnesemia. P: Drend BMP NS@75  K 10 x 3 Additional K 40 x 1   GI A: Nausea and vomiting . Diarrhea/ C diff colitis. Idiosyncrasy to Phenergan.  GI Px is not indicated. P: Abx as  above Advance diet as tolerated Avoid Phenergan D/c Pepcid  HEME A: Mild anemia. VTE Px. P: Trend CBC Heparin  Endocrine  A: DM S: SSI  Infectious disease  A:  C diff colitis P: Abx as above x 10 days  Neurology A: Acute encephalopathy, resolving. Subacute CVA. Hallucinations after Phenergan. P: Monitor clinically.  Downgrade telemetry.  PCCM will sign off.  TR[facility] to assume care 1/13.  I have personally obtained history, examined patient, evaluated and interpreted laboratory and imaging results, reviewed medical records, formulated assessment / plan and placed orders.  Doree Fudge, MD Pulmonary and [place] Pager: [phone]  07/27/2013, 3:11 PM

## 2013-07-27 NOTE — Progress Notes (Addendum)
Pt has  been increasingly confused since receiving phenergan at 0200.  Gives correct answers to orientation , name, date, place but has been very figity, picking at IV sites, EKG lead and pulse ox.  Green mittens removed times three..  Having visual hallucinations, seeing rats on the wall, water running over the sink etc.  Denies pain, has had no further emesis or nausea since phenergan received. No stools this 12 hr shift.  To advance to clear liquid diet per order.

## 2013-07-27 NOTE — Progress Notes (Signed)
Echocardiogram 2D Echocardiogram has been performed.  [name] 07/27/2013, 11:07 AM

## 2013-07-28 DIAGNOSIS — F0390 Unspecified dementia without behavioral disturbance: Secondary | ICD-10-CM | POA: Diagnosis present

## 2013-07-28 LAB — COMPREHENSIVE METABOLIC PANEL
ALBUMIN: 2.7 g/dL — AB (ref 3.5–5.2)
ALT: 7 U/L (ref 0–35)
AST: 10 U/L (ref 0–37)
Alkaline Phosphatase: 54 U/L (ref 39–117)
BUN: 9 mg/dL (ref 6–23)
CALCIUM: 7.3 mg/dL — AB (ref 8.4–10.5)
CHLORIDE: 108 meq/L (ref 96–112)
CO2: 21 mEq/L (ref 19–32)
CREATININE: 0.61 mg/dL (ref 0.50–1.10)
GFR calc Af Amer: >90 mL/min (ref >90)
GFR, EST NON AFRICAN AMERICAN: 88 mL/min — AB (ref >90)
Glucose, Bld: 138 mg/dL — ABNORMAL HIGH (ref 70–99)
Potassium: 3.9 mEq/L (ref 3.7–5.3)
Sodium: 143 mEq/L (ref 137–147)
Total Bilirubin: 0.5 mg/dL (ref 0.3–1.2)
Total Protein: 5.5 g/dL — ABNORMAL LOW (ref 6.0–8.3)

## 2013-07-28 LAB — GLUCOSE, CAPILLARY
GLUCOSE-CAPILLARY: 143 mg/dL — AB (ref 70–99)
GLUCOSE-CAPILLARY: 123 mg/dL — AB (ref 70–99)
GLUCOSE-CAPILLARY: 129 mg/dL — AB (ref 70–99)
Glucose-Capillary: 146 mg/dL — ABNORMAL HIGH (ref 70–99)

## 2013-07-28 LAB — CSF CULTURE W GRAM STAIN: Gram Stain: NONE SEEN

## 2013-07-28 LAB — CSF CULTURE: Culture: NO GROWTH

## 2013-07-28 LAB — CBC
HCT: 31.3 % — ABNORMAL LOW (ref 36.0–46.0)
HEMOGLOBIN: 10.3 g/dL — AB (ref 12.0–15.0)
MCH: 29.5 pg (ref 26.0–34.0)
MCHC: 32.9 g/dL (ref 30.0–36.0)
MCV: 89.7 fL (ref 78.0–100.0)
PLATELETS: 353 10*3/uL (ref 150–400)
RBC: 3.49 MIL/uL — AB (ref 3.87–5.11)
RDW: 15 % (ref 11.5–15.5)
WBC: 15.7 10*3/uL — ABNORMAL HIGH (ref 4.0–10.5)

## 2013-07-28 LAB — MAGNESIUM: Magnesium: 1.7 mg/dL (ref 1.5–2.5)

## 2013-07-28 MED ORDER — LINAGLIPTIN 5 MG PO TABS
5.0000 mg | ORAL_TABLET | Freq: Every day | ORAL | Status: DC
  Administered 2013-07-28 – 2013-07-30 (×3): 5 mg via ORAL
  Filled 2013-07-28 (×3): qty 1

## 2013-07-28 MED ORDER — TRAMADOL HCL 50 MG PO TABS: 50.0000 mg | ORAL_TABLET | Freq: Four times a day (QID) | ORAL | Status: DC | PRN

## 2013-07-28 MED ORDER — ATORVASTATIN CALCIUM 10 MG PO TABS
10.0000 mg | ORAL_TABLET | Freq: Every day | ORAL | Status: DC
  Filled 2013-07-28: qty 1

## 2013-07-28 MED ORDER — VERAPAMIL HCL ER 240 MG PO TBCR
240.0000 mg | EXTENDED_RELEASE_TABLET | Freq: Every day | ORAL | Status: DC
  Administered 2013-07-28 – 2013-07-30 (×3): 240 mg via ORAL
  Filled 2013-07-28 (×3): qty 1

## 2013-07-28 MED ORDER — METRONIDAZOLE 500 MG PO TABS
500.0000 mg | ORAL_TABLET | Freq: Three times a day (TID) | ORAL | Status: DC
  Administered 2013-07-28 – 2013-07-30 (×6): 500 mg via ORAL
  Filled 2013-07-28 (×9): qty 1

## 2013-07-28 MED ORDER — ASPIRIN EC 325 MG PO TBEC
325.0000 mg | DELAYED_RELEASE_TABLET | Freq: Every day | ORAL | Status: DC
  Administered 2013-07-28 – 2013-07-30 (×3): 325 mg via ORAL
  Filled 2013-07-28 (×3): qty 1

## 2013-07-28 NOTE — Progress Notes (Signed)
Received call from Dr Wynelle Cleveland after page.  Spoke with Dr Wynelle Cleveland about patient not needing a sitter.  Patient alert and oriented x 4. Patient calm and cooperative. Per Dr Wynelle Cleveland, ok with patient not needing a sitter as she has recovered her mental status  and is oriented and appropriate.

## 2013-07-28 NOTE — Significant Event (Signed)
Pt noted to have intolerance to atorvastatin which has been ordered. i have [place] this order. Primary team to decide whether an alternative is indicated   Merton Border, MD ; [facility] [phone].  After 5:30 PM or weekends, call [phone]

## 2013-07-28 NOTE — Progress Notes (Signed)
[facility] Progress Note    [name] KVQ:[id number] DOB: Jul 14, 1941 DOA: 07/25/2013 PCP: Wyatt Haste, MD  Brief narrative: 73 year old female patient presented to the hospital with altered mentation. Had undergone left carotid endarterectomy at the end of December and prior to this had had a stroke with mild residual expressive aphasia. Now has a two-day history of increasing confusion. Seen initially in the emergency department on 07/24/2013. MR that time demonstrated no evidence of acute stroke. Returned to the ER after developed a new symptom of nausea and vomiting with diarrhea. Also a new macular rash. LP was completed in the emergency department. Her initial white count was 21,400 with a left shift. She appeared dry with a BUN of 29 but normal creatinine 0.9. During the admitting physician's initial exam the patient's temperature was 102.9, she was tachycardic with a heart rate of 128, she was tachypneic with a respiratory rate of 26 but she was normotensive with a blood pressure 153/69.  While still in the ER patient developed symptoms consistent with progressive sepsis and impending shock so critical care medicine was consulted and subsequently admitted the patient to the ICU. In the interim C. difficile PCR returned positive so this was considered the likely source of her sepsis. Because of the degree of her metabolic encephalopathy and concerns over airway protection the patient was urgently intubated and a central line was placed.  After admission serial chest X. ray revealed no evidence of aspiration. Patient was aggressively hydrated and treatment was begun for her C. difficile colitis. She was eventually extubated and her neurological status has subsequently improved and now she is alert although confused and having symptoms consistent with acute delirium. Because of her history of CVA neurology had been consulted but since no evidence of acute stroke they have  subsequently signed off. Patient stabilized enough to be considered appropriate for transfer to the telemetry unit.    Assessment/Plan: Active Problems:   Delirium/Dementia -cont supportive care- restrain as needed -hopefully tx out of ICU will improve rest and lessen delirium sx's    C. difficile colitis -1st occurrence -due to septic presentation was treated with PO Vanco w/ IV Flagyl but since stable can transition to PO Flagyl only -diarrhea resolved -WBC trend down -will advance to full liquids today     HTN (hypertension) -moderate control and may be influenced by acute delirium -resume home Calan but cont hold ACE I w/ thiazide diuretic    Type II or unspecified type diabetes mellitus with neurological manifestations, not stated as uncontrolled -HgbA1c was 7.21 May 2013 -cbg controlled -on Janumet pre admit -can resume sitagliptin or equivalent but hold Metformin during acute illness    Sepsis/Acute respiratory failure -sepsis physiology has resolved-ECHO this admit completely normal -resp status stable on RA -renal fnx stable and diarrhea has resolved so will KVO fluids    S/P recent Left carotid endarterectomy -site unremarkable   DVT prophylaxis: Lovenox Code Status: Full Family Communication: Husband and sister at bedside Disposition Plan/Expected LOS: Transfer to floor   Consultants: Neurology-signed off  Procedures: 2-D echocardiogram Left ventricle: The cavity size was normal. Wall thickness was increased in a pattern of mild LVH. Systolic function was normal. The estimated ejection fraction was in the range of 50% to 55%. Wall motion was normal; there were no regional wall motion abnormalities. Left ventricular diastolic function parameters were normal.  Lumbar puncture Without evidence of meningitis  EEG Assist with metabolic encephalopathy  CULTURES:  1/10 C.dif PCR >>> POSITIVE  1/10 Blood >>>  1/0 CSF >>> no organisms  >>>  Antibiotics: Vancomycin 1/10 x 1  Rocephin 1/10 x 1  Oral Vancomycin 1/10 >>> 1/13  Flagyl 1/10 >>>   HPI/Subjective: Patient alert but very restless. Oriented to name and year but not to place or situation.  Objective: Blood pressure 165/68, pulse 80, temperature 97.9 F (36.6 C), temperature source Oral, resp. rate 22, height 5\' 4"  (1.626 m), weight 154 lb 8.7 oz (70.1 kg), SpO2 98.00%.  Intake/Output Summary (Last 24 hours) at 07/28/13 1227 Last data filed at 07/28/13 1000  Gross per 24 hour  Intake 3968.25 ml  Output    325 ml  Net 3643.25 ml     Exam: General: No acute respiratory distress Lungs: Clear to auscultation bilaterally without wheezes or crackles, RA Cardiovascular: Regular rate and rhythm without murmur gallop or rub normal S1 and S2, no peripheral edema except for focal edema right forearm consistent with likely IV infiltration or JVD Abdomen: Nontender, nondistended, soft, bowel sounds positive, no rebound, no ascites, no appreciable mass Musculoskeletal: No significant cyanosis, clubbing of bilateral lower extremities Neurological: Alert and oriented x name and year only, restless, moves all extremities x 4 without focal neurological deficits, CN 2-12 intact  Scheduled Meds:  Scheduled Meds: . antiseptic oral rinse  15 mL Mouth Rinse QID  . aspirin  81 mg Oral Daily  . enoxaparin (LOVENOX) injection  40 mg Subcutaneous Q24H  . insulin aspart  0-9 Units Subcutaneous TID WC  . metronidazole  500 mg Intravenous Q8H  . sodium chloride  1,000 mL Intravenous Once  . sodium chloride  3 mL Intravenous Q12H  . vancomycin  125 mg Per Tube QID   Continuous Infusions: . sodium chloride 75 mL/hr at 07/28/13 0800  . sodium chloride 75 mL/hr at 07/28/13 0800    **Reviewed in detail by the Attending Physician  Data Reviewed: Basic Metabolic Panel:  Recent Labs Lab 07/24/13 1120 07/25/13 0358 07/25/13 0407 07/26/13 0350 07/26/13 0900 07/27/13 0614  07/28/13 0315  NA 138 142 138 144  --  144 143  K 4.3 4.1 4.6 3.1*  --  3.4* 3.9  CL 96 95* 96 108  --  108 108  CO2 32 29  --  23  --  24 21  GLUCOSE 184* 288* 293* 164*  --  126* 138*  BUN 17 20 29* 20  --  13 9  CREATININE 0.70 0.73 0.90 0.70  --  0.66 0.61  CALCIUM 10.6* 10.8*  --  7.9*  --  7.7* 7.3*  MG  --   --   --   --  1.3*  --  1.7   Liver Function Tests:  Recent Labs Lab 07/24/13 1120 07/25/13 0358 07/26/13 0350 07/28/13 0315  AST 13 13 11 10   ALT 9 10 6 7   ALKPHOS 76 81 52 54  BILITOT 0.7 0.7 0.6 0.5  PROT 7.5 7.7 5.3* 5.5*  ALBUMIN 3.9 4.1 2.6* 2.7*   No results found for this basename: LIPASE, AMYLASE,  in the last 168 hours  Recent Labs Lab 07/25/13 0605  AMMONIA 24   CBC:  Recent Labs Lab 07/24/13 1120 07/25/13 0358 07/25/13 0407 07/26/13 0350 07/27/13 0614 07/28/13 0315  WBC 14.8* 21.4*  --  20.5* 19.2* 15.7*  NEUTROABS  --  18.3*  --   --   --   --   HGB 13.3 14.0 15.6* 11.1* 9.6* 10.3*  HCT 39.5 41.1 46.0 33.2* 27.8* 31.3*  MCV 88.2 88.8  --  89.0 86.9 89.7  PLT 552* 543*  --  421* 358 353   Cardiac Enzymes:  Recent Labs Lab 07/25/13 0359 07/25/13 1517 07/25/13 2143 07/26/13 0350  TROPONINI <0.30 <0.30 0.33* <0.30   BNP (last 3 results) No results found for this basename: PROBNP,  in the last 8760 hours CBG:  Recent Labs Lab 07/27/13 1603 07/27/13 1856 07/27/13 2238 07/28/13 0743 07/28/13 1214  GLUCAP 179* 153* 126* 143* 123*    Recent Results (from the past 240 hour(s))  CSF CULTURE     Status: None   Collection Time    07/25/13  5:28 AM      Result Value Range Status   Specimen Description CSF   Final   Special Requests NONE   Final   Gram Stain     Final   Value: CYTOSPIN NO WBC SEEN     NO ORGANISMS SEEN     Gram Stain Report Called to,Read Back By and Verified With: Gram Stain Report Called to,Read Back By and Verified With: Carmelia Roller. RN 07/25/13 0747 BY JONESJ Performed at [facility]      Performed at [facility]   Culture     Final   Value: NO GROWTH 2 DAYS     Performed at [facility]   Report Status PENDING   Incomplete  GRAM STAIN     Status: None   Collection Time    07/25/13  5:28 AM      Result Value Range Status   Specimen Description CSF   Final   Special Requests NONE   Final   Gram Stain     Final   Value: WBC PRESENT, PREDOMINANTLY MONONUCLEAR     NO ORGANISMS SEEN     Gram Stain Report Called to,Read Back By and Verified With: WILLIAMS M.,RN 07/25/13 0747 BY JONESJ   Report Status 07/25/2013 FINAL   Final  CLOSTRIDIUM DIFFICILE BY PCR     Status: Abnormal   Collection Time    07/25/13 11:57 AM      Result Value Range Status   C difficile by pcr POSITIVE (*) NEGATIVE Final   Comment: CRITICAL RESULT CALLED TO, READ BACK BY AND VERIFIED WITH:     ADKINS C,RN 1332 07/25/13 [facility]  CULTURE, BLOOD (ROUTINE X 2)     Status: None   Collection Time    07/25/13  1:00 PM      Result Value Range Status   Specimen Description BLOOD LEFT ARM   Final   Special Requests BOTTLES DRAWN AEROBIC AND ANAEROBIC 10CC   Final   Culture  Setup Time     Final   Value: 07/25/2013 22:04     Performed at [facility]   Culture     Final   Value:        BLOOD CULTURE RECEIVED NO GROWTH TO DATE CULTURE WILL BE HELD FOR 5 DAYS BEFORE ISSUING A FINAL NEGATIVE REPORT     Performed at [facility]   Report Status PENDING   Incomplete  MRSA PCR SCREENING     Status: None   Collection Time    07/25/13  3:16 PM      Result Value Range Status   MRSA by PCR NEGATIVE  NEGATIVE Final   Comment:            The GeneXpert MRSA Assay (FDA     approved for NASAL specimens  only), is one component of a     comprehensive MRSA colonization     surveillance program. It is not     intended to diagnose MRSA     infection nor to guide or     monitor treatment for     MRSA infections.  URINE CULTURE     Status: None   Collection Time    07/25/13  3:37  PM      Result Value Range Status   Specimen Description URINE, CATHETERIZED   Final   Special Requests NONE   Final   Culture  Setup Time     Final   Value: 07/25/2013 21:30     Performed at [place]     Final   Value: NO GROWTH     Performed at [facility]   Culture     Final   Value: NO GROWTH     Performed at [facility]   Report Status 07/26/2013 FINAL   Final     Studies:  Recent x-ray studies have been reviewed in detail by the Attending Physician  Time spent :   Erin Hearing, [place] [facility] Office  [phone] Pager [phone]  **If unable to reach the above provider after paging please contact the [place] @ [phone]  On-Call/Text Page:      [email or web address]      password [email or web address]  If 7PM-7AM, please contact night-coverage [email or web address] Password [email or web address] 07/28/2013, 12:27 PM   LOS: 3 days   I have examined the patient, reviewed the chart and modified the above note which I agree with.   Judye Lorino,MD [phone] 07/28/2013, 4:54 PM

## 2013-07-29 ENCOUNTER — Ambulatory Visit (HOSPITAL_COMMUNITY): Payer: Medicare Other | Admitting: Speech Pathology

## 2013-07-29 ENCOUNTER — Encounter: Payer: Self-pay | Admitting: Vascular Surgery

## 2013-07-29 ENCOUNTER — Telehealth (HOSPITAL_COMMUNITY): Payer: Self-pay

## 2013-07-29 LAB — CBC
HCT: 32.9 % — ABNORMAL LOW (ref 36.0–46.0)
Hemoglobin: 11 g/dL — ABNORMAL LOW (ref 12.0–15.0)
MCH: 29.5 pg (ref 26.0–34.0)
MCHC: 33.4 g/dL (ref 30.0–36.0)
MCV: 88.2 fL (ref 78.0–100.0)
PLATELETS: 383 10*3/uL (ref 150–400)
RBC: 3.73 MIL/uL — ABNORMAL LOW (ref 3.87–5.11)
RDW: 14.8 % (ref 11.5–15.5)
WBC: 14.2 10*3/uL — ABNORMAL HIGH (ref 4.0–10.5)

## 2013-07-29 LAB — GLUCOSE, CAPILLARY
GLUCOSE-CAPILLARY: 124 mg/dL — AB (ref 70–99)
Glucose-Capillary: 131 mg/dL — ABNORMAL HIGH (ref 70–99)
Glucose-Capillary: 123 mg/dL — ABNORMAL HIGH (ref 70–99)
Glucose-Capillary: 168 mg/dL — ABNORMAL HIGH (ref 70–99)
Glucose-Capillary: 172 mg/dL — ABNORMAL HIGH (ref 70–99)

## 2013-07-29 LAB — BASIC METABOLIC PANEL
BUN: 6 mg/dL (ref 6–23)
CO2: 21 mEq/L (ref 19–32)
CREATININE: 0.55 mg/dL (ref 0.50–1.10)
Calcium: 7.9 mg/dL — ABNORMAL LOW (ref 8.4–10.5)
Chloride: 103 mEq/L (ref 96–112)
GFR calc Af Amer: >90 mL/min (ref >90)
GFR calc non Af Amer: >90 mL/min (ref >90)
Glucose, Bld: 133 mg/dL — ABNORMAL HIGH (ref 70–99)
Potassium: 3.4 mEq/L — ABNORMAL LOW (ref 3.7–5.3)
Sodium: 140 mEq/L (ref 137–147)

## 2013-07-29 MED ORDER — LISINOPRIL 20 MG PO TABS
20.0000 mg | ORAL_TABLET | Freq: Every day | ORAL | Status: DC
  Administered 2013-07-29 – 2013-07-30 (×2): 20 mg via ORAL
  Filled 2013-07-29 (×2): qty 1

## 2013-07-29 MED ORDER — POTASSIUM CHLORIDE CRYS ER 20 MEQ PO TBCR
40.0000 meq | EXTENDED_RELEASE_TABLET | Freq: Once | ORAL | Status: AC
  Administered 2013-07-29: 12:00:00 40 meq via ORAL
  Filled 2013-07-29: qty 2

## 2013-07-29 MED ORDER — ATORVASTATIN CALCIUM 10 MG PO TABS: 10.0000 mg | ORAL_TABLET | Freq: Every day | ORAL | Status: DC

## 2013-07-29 MED ORDER — LISINOPRIL-HYDROCHLOROTHIAZIDE 20-12.5 MG PO TABS: 1.0000 | ORAL_TABLET | Freq: Every day | ORAL | Status: DC

## 2013-07-29 MED ORDER — HYDROCHLOROTHIAZIDE 12.5 MG PO CAPS
12.5000 mg | ORAL_CAPSULE | Freq: Every day | ORAL | Status: DC
  Administered 2013-07-29 – 2013-07-30 (×2): 12.5 mg via ORAL
  Filled 2013-07-29 (×2): qty 1

## 2013-07-29 MED ORDER — ROSUVASTATIN CALCIUM 20 MG PO TABS
20.0000 mg | ORAL_TABLET | Freq: Every day | ORAL | Status: DC
  Administered 2013-07-29: 20 mg via ORAL
  Filled 2013-07-29 (×2): qty 1

## 2013-07-29 NOTE — Evaluation (Signed)
Physical Therapy Evaluation Patient Details Name: [name] MRN: [id number] DOB: 04-Oct-1940 Today's Date: 07/29/2013 Time: 3007-6226 PT Time Calculation (min): 26 min  PT Assessment / Plan / Recommendation History of Present Illness  73 yo with recent CVA admitted with acute encephalopathy requiring airway and diarrhea  Clinical Impression  Patient demonstrates deficits in functional mobility as indicated below. Pt will benefit from continued skilled PT to address deficits and maximize independence. Will continue to see as indicated. rec HHPT upon discharge with family supervision and hands on assist for mobility.    PT Assessment  Patient needs continued PT services    Follow Up Recommendations  Home health PT;Supervision/Assistance - 24 hour          Equipment Recommendations  Other (comment) (family to confirm that patient has RW at home)       Frequency Min 4X/week    Precautions / Restrictions Precautions Precautions: Fall Restrictions Weight Bearing Restrictions: No   Pertinent Vitals/Pain No pain at this time      Mobility  Bed Mobility Overal bed mobility: Independent Transfers Overall transfer level: Modified independent Ambulation/Gait Ambulation/Gait assistance: Mod assist Ambulation Distance (Feet): 210 Feet Assistive device: Rolling walker (2 wheeled);None (110 without device) Gait Pattern/deviations: Step-through pattern;Decreased stride length;Scissoring;Staggering left;Narrow base of support Gait velocity: decreased Gait velocity interpretation: <1.8 ft/sec, indicative of risk for recurrent falls General Gait Details: VCs for improved cadence, cues for use of RW, assist for stability        PT Diagnosis: Difficulty walking;Abnormality of gait;Generalized weakness  PT Problem List: Decreased strength;Decreased activity tolerance;Decreased balance;Decreased mobility;Decreased coordination;Decreased knowledge of use of DME PT Treatment  Interventions: DME instruction;Gait training;Stair training;Functional mobility training;Therapeutic activities;Therapeutic exercise;Balance training;Patient/family education     PT Goals(Current goals can be found in the care plan section) Acute Rehab PT Goals Patient Stated Goal: to be independent again PT Goal Formulation: With patient Time For Goal Achievement: 08/12/13 Potential to Achieve Goals: Good  Visit Information  Last PT Received On: 07/29/13 Assistance Needed: +1 History of Present Illness: 73 yo with recent CVA admitted with acute encephalopathy requiring airway and diarrhea       Prior [place] expects to be discharged to:: Private residence Living Arrangements: Spouse/significant other Available Help at Discharge: Family;Available 24 hours/day Type of Home: House Home Access: Stairs to enter [employer] of Steps: 1 Entrance Stairs-Rails: None Home Layout: One level Home Equipment: Walker - 2 wheels;Bedside commode Additional Comments: used walker in past when she hurt her knee  Lives With: Spouse;Daughter Prior Function Level of Independence: Independent Dominant Hand: Right    Cognition  Cognition Arousal/Alertness: Awake/alert Behavior During Therapy: WFL for tasks assessed/performed Overall Cognitive Status: Within Functional Limits for tasks assessed    Extremity/Trunk Assessment Upper Extremity Assessment Upper Extremity Assessment: Generalized weakness Lower Extremity Assessment Lower Extremity Assessment: Generalized weakness   Balance Balance Overall balance assessment: Needs assistance Sitting-balance support: Feet supported Sitting balance-Leahy Scale: Good Standing balance support: Bilateral upper extremity supported;During functional activity Standing balance-Leahy Scale: Fair High level balance activites: Side stepping;Backward walking;Direction changes;Turns High Level Balance Comments:  significant LOB with higher level balance  End of Session PT - End of Session Equipment Utilized During Treatment: Gait belt Activity Tolerance: Patient tolerated treatment well Patient left: in bed;with bed alarm set;with family/visitor present Nurse Communication: Mobility status  GP     Duncan Dull 07/29/2013, 1:41 PM Alben Deeds, [place] DPT  [phone]

## 2013-07-29 NOTE — Progress Notes (Signed)
Pt transferred from 23M via bed. Pt A&O, VS stable, and skin intact. Pt's oriented to unit, call bell within reach, and family at bedside. Pt currently resting comfortably in bed. Will continue to monitor.

## 2013-07-29 NOTE — Care Management Note (Addendum)
    Page 1 of 2   07/30/2013     9:01:34 AM   CARE MANAGEMENT NOTE 07/30/2013  Patient:  [name], [name]   Account Number:  [id number]  Date Initiated:  07/27/2013  Documentation initiated by:  [facility]  Subjective/Objective Assessment:   Admitted with resp failure - intubated.     Action/Plan:   pt eval- rec hhpt   Anticipated DC Date:  07/30/2013   Anticipated DC Plan:  [place]  CM consult      [facility] Choice  HOME HEALTH   Choice offered to / List presented to:  C-4 Adult Children   DME arranged  [place]      DME agency  [place] arranged  [facility] RN      [place].   Status of service:  Completed, signed off Medicare Important Message given?   (If response is "NO", the following Medicare IM given date fields will be blank) Date Medicare IM given:   Date Additional Medicare IM given:    Discharge Disposition:  [place]  Per UR Regulation:  Reviewed for med. necessity/level of care/duration of stay  If discussed at [place] of Stay Meetings, dates discussed:    Comments:  ContactNovella Rob Daughter [phone]   [phone]                 Larke,James Spouse [phone]  07/30/13 9:00 Tomi Bamberger RN,BSN [phone] patient for dc today, will add HHRN to [facility] services, notified [facility].  07/29/13 14:33 Tomi Bamberger RN, BSN [phone] patient for dc tomorrow, per physical therapy rec hhpt, NCM spoke with daguhter in the room she states that patient was taking speech therapy at [facility] as outpt but she will cancel those appts since she will be receiving hh services.  NCM informed her that we could add ST therapy to the hh services, she stated that would be good.  Daughter chose [facility], Referral made to [facility], Butch Penny notified. Soc will begin 24-48 hrs post discharge.  Patient also needs a rolling walker, this was oredered.   Justin with [facility] will bring up to patient's room.  07-28-13 2:35pm Luz Lex, RNBSN [phone] Talked with patient.  Staff state mentally clearer today. Patient is awake alert - able to state she lives at home with husband - independent prior to admission.  Staff state she is moving independently in room - using restroom etc. CM  will continue to follow for further needs.

## 2013-07-29 NOTE — Progress Notes (Signed)
PATIENT DETAILS Name: [name] Age: 73 y.o. Sex: female Date of Birth: March 16, 1941 Admit Date: 07/25/2013 Admitting Physician Geradine Girt, DO OZD:[id number],VQQV Juanda Crumble, MD  Subjective: Diarrhea has resolved. She has no major complaints, diet being advanced. She seems to have improved rapidly.  Assessment/Plan: Acute encephalopathy - Secondary to C. difficile sepsis - Resolved.  Workup so far: 1/09 CT head >> subacute Lt frontal stroke  1/09 MRI brain >> subacute Lt frontal stroke  1/10 CT head >> subacute Lt frontal stroke  1/10 LP >> negative for meningitis, cultures negative 9/56 EEG >>> metabolic encephalopathy  Severe sepsis - Secondary to C. difficile colitis - Resolved  Microbiology: CSF culture 1/10>> negative  Blood culture 1/10>> negative Urine culture 1/10>> negative 1/10 C.dif PCR >> POSITIVE   C. difficile colitis - Continue Flagyl, will plan a total of 2 weeks therapy -  diarrhea has almost resolved  History of recent CVA  - Continue aspirin  - Can resume statins  - Status post recent left carotid arterectomy  Hypertension  - Not optimally controlled  - Continue verapamil, resume lisinopril HCTZ    diabetes  - CBGs stable, continue sliding scale insulin - Resume oral hypoglycemics on discharge  Disposition: Remain inpatient  DVT Prophylaxis: Prophylactic Lovenox   Code Status: Full code   Family Communication Spouse at bedside   Procedures: OETT 1/10 >>> 1/11  R IJ CVL 1/10 >>>  Foley 1/10 >>>   CONSULTS:  pulmonary/intensive care  MEDICATIONS: Scheduled Meds: . antiseptic oral rinse  15 mL Mouth Rinse QID  . aspirin EC  325 mg Oral Daily  . enoxaparin (LOVENOX) injection  40 mg Subcutaneous Q24H  . insulin aspart  0-9 Units Subcutaneous TID WC  . linagliptin  5 mg Oral Daily  . metroNIDAZOLE  500 mg Oral Q8H  . sodium chloride  1,000 mL Intravenous Once  . sodium chloride  3 mL Intravenous Q12H  . verapamil   240 mg Oral Daily   Continuous Infusions: . sodium chloride 10 mL/hr (07/28/13 1513)   PRN Meds:.acetaminophen (TYLENOL) oral liquid 160 mg/5 mL, ondansetron (ZOFRAN) IV, traMADol  Antibiotics: Anti-infectives   Start     Dose/Rate Route Frequency Ordered Stop   07/28/13 1400  metroNIDAZOLE (FLAGYL) tablet 500 mg     500 mg Oral 3 times per day 07/28/13 1243     07/25/13 1800  vancomycin (VANCOCIN) 50 mg/mL oral solution 125 mg  Status:  Discontinued     125 mg Per Tube 4 times daily 07/25/13 1516 07/28/13 1243   07/25/13 1430  vancomycin (VANCOCIN) 50 mg/mL oral solution 125 mg  Status:  Discontinued     125 mg Oral 4 times daily 07/25/13 1417 07/25/13 1516   07/25/13 1345  metroNIDAZOLE (FLAGYL) IVPB 500 mg  Status:  Discontinued     500 mg 100 mL/hr over 60 Minutes Intravenous Every 8 hours 07/25/13 1333 07/28/13 1250   07/25/13 0600  acyclovir (ZOVIRAX) 670 mg in dextrose 5 % 100 mL IVPB  Status:  Discontinued     10 mg/kg  67.1 kg 113.4 mL/hr over 60 Minutes Intravenous 3 times per day 07/25/13 0556 07/25/13 1402   07/25/13 0500  cefTRIAXone (ROCEPHIN) 2 g in dextrose 5 % 50 mL IVPB     2 g 100 mL/hr over 30 Minutes Intravenous STAT 07/25/13 0446 07/25/13 0651   07/25/13 0500  vancomycin (VANCOCIN) 1,500 mg in sodium chloride 0.9 % 500 mL IVPB     1,500  mg 250 mL/hr over 120 Minutes Intravenous STAT 07/25/13 0446 07/25/13 1357       PHYSICAL EXAM: Vital signs in last 24 hours: Filed Vitals:   07/28/13 1617 07/28/13 1943 07/28/13 2054 07/29/13 0421  BP:   178/74 179/72  Pulse:   61 68  Temp: 98.8 F (37.1 C) 98.6 F (37 C) 98.2 F (36.8 C) 98.2 F (36.8 C)  TempSrc: Oral Oral Oral Oral  Resp:   22 22  Height:   5\' 6"  (1.676 m)   Weight:   70.2 kg (154 lb 12.2 oz)   SpO2:   98% 97%    Weight change:  Filed Weights   07/26/13 0315 07/27/13 0400 07/28/13 2054  Weight: 69.5 kg (153 lb 3.5 oz) 70.1 kg (154 lb 8.7 oz) 70.2 kg (154 lb 12.2 oz)   Body mass index is  24.99 kg/(m^2).   Gen Exam: Awake and alert with clear speech.   Neck: Supple, No JVD.   Chest: B/L Clear.   CVS: S1 S2 Regular, no murmurs.  Abdomen: soft, BS +, non tender, non distended.  Extremities: no edema, lower extremities warm to touch Neurologic: Non Focal.   Skin: No Rash.  Wounds: N/A.    Intake/Output from previous day:  Intake/Output Summary (Last 24 hours) at 07/29/13 1036 Last data filed at 07/28/13 1800  Gross per 24 hour  Intake 1169.08 ml  Output      0 ml  Net 1169.08 ml     LAB RESULTS: CBC  Recent Labs Lab 07/25/13 0358 07/25/13 0407 07/26/13 0350 07/27/13 0614 07/28/13 0315 07/29/13 0550  WBC 21.4*  --  20.5* 19.2* 15.7* 14.2*  HGB 14.0 15.6* 11.1* 9.6* 10.3* 11.0*  HCT 41.1 46.0 33.2* 27.8* 31.3* 32.9*  PLT 543*  --  421* 358 353 383  MCV 88.8  --  89.0 86.9 89.7 88.2  MCH 30.2  --  29.8 30.0 29.5 29.5  MCHC 34.1  --  33.4 34.5 32.9 33.4  RDW 14.3  --  14.7 14.8 15.0 14.8  LYMPHSABS 2.2  --   --   --   --   --   MONOABS 0.8  --   --   --   --   --   EOSABS 0.1  --   --   --   --   --   BASOSABS 0.0  --   --   --   --   --     Chemistries   Recent Labs Lab 07/25/13 0358 07/25/13 0407 07/26/13 0350 07/26/13 0900 07/27/13 0614 07/28/13 0315 07/29/13 0550  NA 142 138 144  --  144 143 140  K 4.1 4.6 3.1*  --  3.4* 3.9 3.4*  CL 95* 96 108  --  108 108 103  CO2 29  --  23  --  24 21 21   GLUCOSE 288* 293* 164*  --  126* 138* 133*  BUN 20 29* 20  --  13 9 6   CREATININE 0.73 0.90 0.70  --  0.66 0.61 0.55  CALCIUM 10.8*  --  7.9*  --  7.7* 7.3* 7.9*  MG  --   --   --  1.3*  --  1.7  --     CBG:  Recent Labs Lab 07/28/13 1214 07/28/13 1528 07/28/13 1902 07/29/13 0038 07/29/13 0736  GLUCAP 123* 129* 146* 124* 131*    GFR Estimated Creatinine Clearance: 59.5 ml/min (by C-G formula based on Cr of 0.55).  Coagulation profile  Recent Labs Lab 07/25/13 0358  INR 0.96    Cardiac Enzymes  Recent Labs Lab  07/25/13 1517 07/25/13 2143 07/26/13 0350  TROPONINI <0.30 0.33* <0.30    No components found with this basename: POCBNP,  No results found for this basename: DDIMER,  in the last 72 hours No results found for this basename: HGBA1C,  in the last 72 hours No results found for this basename: CHOL, HDL, LDLCALC, TRIG, CHOLHDL, LDLDIRECT,  in the last 72 hours No results found for this basename: TSH, T4TOTAL, FREET3, T3FREE, THYROIDAB,  in the last 72 hours No results found for this basename: VITAMINB12, FOLATE, FERRITIN, TIBC, IRON, RETICCTPCT,  in the last 72 hours No results found for this basename: LIPASE, AMYLASE,  in the last 72 hours  Urine Studies No results found for this basename: UACOL, UAPR, USPG, UPH, UTP, UGL, UKET, UBIL, UHGB, UNIT, UROB, ULEU, UEPI, UWBC, URBC, UBAC, CAST, CRYS, UCOM, BILUA,  in the last 72 hours  MICROBIOLOGY: Recent Results (from the past 240 hour(s))  CSF CULTURE     Status: None   Collection Time    07/25/13  5:28 AM      Result Value Range Status   Specimen Description CSF   Final   Special Requests NONE   Final   Gram Stain     Final   Value: CYTOSPIN NO WBC SEEN     NO ORGANISMS SEEN     Gram Stain Report Called to,Read Back By and Verified With: Gram Stain Report Called to,Read Back By and Verified With: Carmelia Roller RN 07/25/13 0747 BY JONESJ Performed at [facility]     Performed at [facility]   Culture     Final   Value: NO GROWTH 3 DAYS     Performed at [facility]   Report Status 07/28/2013 FINAL   Final  GRAM STAIN     Status: None   Collection Time    07/25/13  5:28 AM      Result Value Range Status   Specimen Description CSF   Final   Special Requests NONE   Final   Gram Stain     Final   Value: WBC PRESENT, PREDOMINANTLY MONONUCLEAR     NO ORGANISMS SEEN     Gram Stain Report Called to,Read Back By and Verified With: WILLIAMS M.,RN 07/25/13 0747 BY JONESJ   Report Status 07/25/2013 FINAL   Final   CLOSTRIDIUM DIFFICILE BY PCR     Status: Abnormal   Collection Time    07/25/13 11:57 AM      Result Value Range Status   C difficile by pcr POSITIVE (*) NEGATIVE Final   Comment: CRITICAL RESULT CALLED TO, READ BACK BY AND VERIFIED WITH:     ADKINS C,RN [id number] 07/25/13 [facility]  CULTURE, BLOOD (ROUTINE X 2)     Status: None   Collection Time    07/25/13  1:00 PM      Result Value Range Status   Specimen Description BLOOD LEFT ARM   Final   Special Requests BOTTLES DRAWN AEROBIC AND ANAEROBIC 10CC   Final   Culture  Setup Time     Final   Value: 07/25/2013 22:04     Performed at [facility]   Culture     Final   Value:        BLOOD CULTURE RECEIVED NO GROWTH TO DATE CULTURE WILL BE HELD FOR 5 DAYS BEFORE  ISSUING A FINAL NEGATIVE REPORT     Performed at [facility]   Report Status PENDING   Incomplete  MRSA PCR SCREENING     Status: None   Collection Time    07/25/13  3:16 PM      Result Value Range Status   MRSA by PCR NEGATIVE  NEGATIVE Final   Comment:            The GeneXpert MRSA Assay (FDA     approved for NASAL specimens     only), is one component of a     comprehensive MRSA colonization     surveillance program. It is not     intended to diagnose MRSA     infection nor to guide or     monitor treatment for     MRSA infections.  URINE CULTURE     Status: None   Collection Time    07/25/13  3:37 PM      Result Value Range Status   Specimen Description URINE, CATHETERIZED   Final   Special Requests NONE   Final   Culture  Setup Time     Final   Value: 07/25/2013 21:30     Performed at [place]     Final   Value: NO GROWTH     Performed at [facility]   Culture     Final   Value: NO GROWTH     Performed at [facility]   Report Status 07/26/2013 FINAL   Final    RADIOLOGY STUDIES/RESULTS: Ct Head Wo Contrast  07/25/2013   CLINICAL DATA:  Stroke  EXAM: CT HEAD WITHOUT CONTRAST  TECHNIQUE: Contiguous  axial images were obtained from the base of the skull through the vertex without intravenous contrast.  COMPARISON:  MRI and CT No new large vessel territory infarct identified. No acute intracranial hemorrhage. No mass or midline shift. No extra-axial fluid collection. Mild chronic microvascular ischemic changes again noted.  Calvarium is intact. Orbits are within normal limits. Paranasal sinuses and mastoid air cells are clear. From 07/24/2013  FINDINGS: Vague hypodensity seen in region of previously identified ischemic infarct within the left frontal lobe is again seen.  IMPRESSION: Similar size and distribution of subacute left frontal lobe ischemic infarct. No new large vessel territory infarct or intracranial hemorrhage identified.  Mild chronic microvascular ischemic disease, unchanged.   Electronically Signed   By: Jeannine Boga M.D.   On: 07/25/2013 04:21   Ct Head Wo Contrast  07/24/2013   CLINICAL DATA:  Slurred speech and 9 ankle date and mental status change for preceding 3 days  EXAM: CT HEAD WITHOUT CONTRAST  TECHNIQUE: Contiguous axial images were obtained from the base of the skull through the vertex without intravenous contrast.  COMPARISON:  MRI of the brain dated June 27, 2013  FINDINGS: The ventricles are normal in size and position. There is subtle decreased density in the deep white matter of the left frontal lobe consistent with known recent infarcts. I do not see evidence of an acute intracranial hemorrhage. No abnormality in the right cerebral hemisphere is demonstrated. The cerebellum and brainstem exhibit no acute abnormalities.  At bone window settings there is no lytic nor blastic lesion or evidence of a skull fracture. The observed portions of the paranasal sinuses and mastoid air cells are clear.  IMPRESSION: There is no evidence of an acute ischemic or hemorrhagic infarction. There are ongoing changes in the  left frontal lobe consistent with the recent ischemic event. If  there are strong clinical concerns of new ischemic symptoms, MRI now would be useful.   Electronically Signed   By: David  Martinique   On: 07/24/2013 13:03   Mr Brain Wo Contrast  07/24/2013   CLINICAL DATA:  aphasia.  Gait disturbance.  Confusion.  EXAM: MRI HEAD WITHOUT CONTRAST  TECHNIQUE: Multiplanar, multiecho pulse sequences of the brain and surrounding structures were obtained without intravenous contrast.  COMPARISON:  Head CT same day.  MRI 06/27/2013.  FINDINGS: Diffusion imaging does not show any new or acute insult. There is minimal residual restricted diffusion at the site of infarction which was acute in December in the left posterior frontal cortical and subcortical brain.  The brainstem and cerebellum are normal. There are mild chronic small-vessel changes affecting the cerebral hemispheric white matter. No mass lesion, hemorrhage, hydrocephalus or extra-axial collection. No pituitary mass. No significant sinus disease. No skull or skullbase lesion.  IMPRESSION: No new infarction when compared to the study of December. Some persistent residual restricted diffusion in the area of infarction in the left posterior frontal region which was acute in December.  Mild chronic small-vessel changes elsewhere affecting the cerebral hemispheric white matter.   Electronically Signed   By: Nelson Chimes M.D.   On: 07/24/2013 16:38   Dg Chest Port 1 View  07/25/2013   CLINICAL DATA:  Right central line placement, OG tube placement  EXAM: PORTABLE CHEST - 1 VIEW  COMPARISON:  07/25/2013 at 1343 hr  FINDINGS: Lungs are clear. No pleural effusion or pneumothorax.  Endotracheal tube terminates 3.5 cm above the carina.  The heart is normal in size.  Right IJ venous catheter terminates in the lower SVC. Enteric tube courses into the stomach.  IMPRESSION: Endotracheal tube terminates 3.5 cm above the carina.  Right IJ venous catheter terminates in the lower SVC. No pneumothorax.   Electronically Signed   By: Julian Hy M.D.   On: 07/25/2013 16:30   Dg Chest Port 1 View  07/25/2013   CLINICAL DATA:  Diabetes, fever, nausea, vomiting  EXAM: PORTABLE CHEST - 1 VIEW  COMPARISON:  06/27/2013  FINDINGS: The heart size and mediastinal contours are within normal limits. Both lungs are clear. The visualized skeletal structures are unremarkable.  IMPRESSION: No active disease.   Electronically Signed   By: Daryll Brod M.D.   On: 07/25/2013 14:06    Oren Binet, MD  [facility] Pager:[phone]  If 7PM-7AM, please contact night-coverage [email or web address] Password [email or web address] 07/29/2013, 10:36 AM   LOS: 4 days

## 2013-07-30 ENCOUNTER — Telehealth: Payer: Self-pay | Admitting: Vascular Surgery

## 2013-07-30 ENCOUNTER — Encounter: Payer: Medicare Other | Admitting: Vascular Surgery

## 2013-07-30 ENCOUNTER — Telehealth (HOSPITAL_COMMUNITY): Payer: Self-pay

## 2013-07-30 LAB — GLUCOSE, CAPILLARY: GLUCOSE-CAPILLARY: 148 mg/dL — AB (ref 70–99)

## 2013-07-30 MED ORDER — METRONIDAZOLE 500 MG PO TABS: 500.0000 mg | ORAL_TABLET | Freq: Three times a day (TID) | ORAL | Status: DC

## 2013-07-30 NOTE — Progress Notes (Signed)
NURSING PROGRESS NOTE  [name] [id number] Discharge Data: 07/30/2013 11:40 AM Attending Provider: Jonetta Osgood, MD NBV:APOLIDC,VUDT Juanda Crumble, MD     Jimmy Picket to be D/C'd Home per MD order.  Discussed with the patient the After Visit Summary and all questions fully answered. All IV's discontinued with no bleeding noted. All belongings returned to patient for patient to take home.   Last Vital Signs:  Blood pressure 153/71, pulse 72, temperature 98.7 F (37.1 C), temperature source Oral, resp. rate 20, height 5\' 6"  (1.676 m), weight 70.2 kg (154 lb 12.2 oz), SpO2 94.00%.  Discharge Medication List   Medication List         aspirin 325 MG EC tablet  Take 1 tablet (325 mg total) by mouth daily.     lisinopril-hydrochlorothiazide 20-12.5 MG per tablet  Commonly known as:  PRINZIDE,ZESTORETIC  Take 1 tablet by mouth daily.     metroNIDAZOLE 500 MG tablet  Commonly known as:  FLAGYL  Take 1 tablet (500 mg total) by mouth every 8 (eight) hours.     rosuvastatin 20 MG tablet  Commonly known as:  CRESTOR  Take 1 tablet (20 mg total) by mouth daily.     sitaGLIPtin-metformin 50-1000 MG per tablet  Commonly known as:  JANUMET  Take 1 tablet by mouth 2 (two) times daily with a meal.     traMADol 50 MG tablet  Commonly known as:  ULTRAM  Take 1 tablet (50 mg total) by mouth every 6 (six) hours as needed.     verapamil 240 MG CR tablet  Commonly known as:  CALAN-SR  Take 240 mg by mouth daily.

## 2013-07-30 NOTE — Progress Notes (Signed)
PATIENT DETAILS Name: [name] Age: 73 y.o. Sex: female Date of Birth: 14-Aug-1940 Admit Date: 07/25/2013 Admitting Physician Geradine Girt, DO OXB:[id number],EQAS Juanda Crumble, MD  Subjective: No BM since yesterday. No vomiting. Seems alert to me this am-answers all questions appropriately.Not discharged on 1/14-due to family's request  Assessment/Plan: Acute encephalopathy - Secondary to C. difficile sepsis - Resolved.  Workup so far: 1/09 CT head >> subacute Lt frontal stroke  1/09 MRI brain >> subacute Lt frontal stroke  1/10 CT head >> subacute Lt frontal stroke  1/10 LP >> negative for meningitis, cultures negative 3/41 EEG >>> metabolic encephalopathy  Severe sepsis - Secondary to C. difficile colitis - Resolved  Microbiology: CSF culture 1/10>> negative  Blood culture 1/10>> negative Urine culture 1/10>> negative 1/10 C.dif PCR >> POSITIVE   C. difficile colitis - Continue Flagyl, will plan a total of 2 weeks therapy -  diarrhea has almost resolved  History of recent CVA  - Continue aspirin  - Can resume statins  - Status post recent left carotid arterectomy  Hypertension  - Much better controlled  - Continue verapamil and lisinopril HCTZ    Diabetes  - CBGs stable, continue sliding scale insulin - Resume oral hypoglycemics on discharge  Disposition: Remain inpatient-home today with HHPT/RN  DVT Prophylaxis: Prophylactic Lovenox   Code Status: Full code   Family Communication Spouse at bedside 1/14 2 daughters 1/14  Procedures: OETT 1/10 >>> 1/11  R IJ CVL 1/10 >>>  Foley 1/10 >>>   CONSULTS:  pulmonary/intensive care  MEDICATIONS: Scheduled Meds: . antiseptic oral rinse  15 mL Mouth Rinse QID  . aspirin EC  325 mg Oral Daily  . enoxaparin (LOVENOX) injection  40 mg Subcutaneous Q24H  . lisinopril  20 mg Oral Daily   And  . hydrochlorothiazide  12.5 mg Oral Daily  . insulin aspart  0-9 Units Subcutaneous TID WC  .  linagliptin  5 mg Oral Daily  . metroNIDAZOLE  500 mg Oral Q8H  . rosuvastatin  20 mg Oral q1800  . sodium chloride  1,000 mL Intravenous Once  . sodium chloride  3 mL Intravenous Q12H  . verapamil  240 mg Oral Daily   Continuous Infusions: . sodium chloride 10 mL/hr (07/28/13 1513)   PRN Meds:.acetaminophen (TYLENOL) oral liquid 160 mg/5 mL, ondansetron (ZOFRAN) IV, traMADol  Antibiotics: Anti-infectives   Start     Dose/Rate Route Frequency Ordered Stop   07/28/13 1400  metroNIDAZOLE (FLAGYL) tablet 500 mg     500 mg Oral 3 times per day 07/28/13 1243     07/25/13 1800  vancomycin (VANCOCIN) 50 mg/mL oral solution 125 mg  Status:  Discontinued     125 mg Per Tube 4 times daily 07/25/13 1516 07/28/13 1243   07/25/13 1430  vancomycin (VANCOCIN) 50 mg/mL oral solution 125 mg  Status:  Discontinued     125 mg Oral 4 times daily 07/25/13 1417 07/25/13 1516   07/25/13 1345  metroNIDAZOLE (FLAGYL) IVPB 500 mg  Status:  Discontinued     500 mg 100 mL/hr over 60 Minutes Intravenous Every 8 hours 07/25/13 1333 07/28/13 1250   07/25/13 0600  acyclovir (ZOVIRAX) 670 mg in dextrose 5 % 100 mL IVPB  Status:  Discontinued     10 mg/kg  67.1 kg 113.4 mL/hr over 60 Minutes Intravenous 3 times per day 07/25/13 0556 07/25/13 1402   07/25/13 0500  cefTRIAXone (ROCEPHIN) 2 g in dextrose 5 % 50 mL IVPB  2 g 100 mL/hr over 30 Minutes Intravenous STAT 07/25/13 0446 07/25/13 0651   07/25/13 0500  vancomycin (VANCOCIN) 1,500 mg in sodium chloride 0.9 % 500 mL IVPB     1,500 mg 250 mL/hr over 120 Minutes Intravenous STAT 07/25/13 0446 07/25/13 1357       PHYSICAL EXAM: Vital signs in last 24 hours: Filed Vitals:   07/29/13 0421 07/29/13 1306 07/29/13 2037 07/30/13 0449  BP: 179/72 156/72 139/64 153/71  Pulse: 68 67 69 72  Temp: 98.2 F (36.8 C) 98.2 F (36.8 C) 98.7 F (37.1 C) 98.7 F (37.1 C)  TempSrc: Oral Oral Oral Oral  Resp: 22 20 20 20   Height:      Weight:      SpO2: 97% 97%  95% 94%    Weight change:  Filed Weights   07/26/13 0315 07/27/13 0400 07/28/13 2054  Weight: 69.5 kg (153 lb 3.5 oz) 70.1 kg (154 lb 8.7 oz) 70.2 kg (154 lb 12.2 oz)   Body mass index is 24.99 kg/(m^2).   Gen Exam: Awake and alert with clear speech.   Neck: Supple, No JVD.   Chest: B/L Clear.   CVS: S1 S2 Regular, no murmurs.  Abdomen: soft, BS +, non tender, non distended.  Extremities: no edema, lower extremities warm to touch Neurologic: Non Focal.   Skin: No Rash.  Wounds: N/A.    Intake/Output from previous day: No intake or output data in the 24 hours ending 07/30/13 0850   LAB RESULTS: CBC  Recent Labs Lab 07/25/13 0358 07/25/13 0407 07/26/13 0350 07/27/13 0614 07/28/13 0315 07/29/13 0550  WBC 21.4*  --  20.5* 19.2* 15.7* 14.2*  HGB 14.0 15.6* 11.1* 9.6* 10.3* 11.0*  HCT 41.1 46.0 33.2* 27.8* 31.3* 32.9*  PLT 543*  --  421* 358 353 383  MCV 88.8  --  89.0 86.9 89.7 88.2  MCH 30.2  --  29.8 30.0 29.5 29.5  MCHC 34.1  --  33.4 34.5 32.9 33.4  RDW 14.3  --  14.7 14.8 15.0 14.8  LYMPHSABS 2.2  --   --   --   --   --   MONOABS 0.8  --   --   --   --   --   EOSABS 0.1  --   --   --   --   --   BASOSABS 0.0  --   --   --   --   --     Chemistries   Recent Labs Lab 07/25/13 0358 07/25/13 0407 07/26/13 0350 07/26/13 0900 07/27/13 0614 07/28/13 0315 07/29/13 0550  NA 142 138 144  --  144 143 140  K 4.1 4.6 3.1*  --  3.4* 3.9 3.4*  CL 95* 96 108  --  108 108 103  CO2 29  --  23  --  24 21 21   GLUCOSE 288* 293* 164*  --  126* 138* 133*  BUN 20 29* 20  --  13 9 6   CREATININE 0.73 0.90 0.70  --  0.66 0.61 0.55  CALCIUM 10.8*  --  7.9*  --  7.7* 7.3* 7.9*  MG  --   --   --  1.3*  --  1.7  --     CBG:  Recent Labs Lab 07/29/13 0736 07/29/13 1148 07/29/13 1704 07/29/13 2220 07/30/13 0736  GLUCAP 131* 123* 168* 172* 148*    GFR Estimated Creatinine Clearance: 59.5 ml/min (by C-G formula based on Cr of 0.55).  Coagulation profile  Recent  Labs Lab 07/25/13 0358  INR 0.96    Cardiac Enzymes  Recent Labs Lab 07/25/13 1517 07/25/13 2143 07/26/13 0350  TROPONINI <0.30 0.33* <0.30    No components found with this basename: POCBNP,  No results found for this basename: DDIMER,  in the last 72 hours No results found for this basename: HGBA1C,  in the last 72 hours No results found for this basename: CHOL, HDL, LDLCALC, TRIG, CHOLHDL, LDLDIRECT,  in the last 72 hours No results found for this basename: TSH, T4TOTAL, FREET3, T3FREE, THYROIDAB,  in the last 72 hours No results found for this basename: VITAMINB12, FOLATE, FERRITIN, TIBC, IRON, RETICCTPCT,  in the last 72 hours No results found for this basename: LIPASE, AMYLASE,  in the last 72 hours  Urine Studies No results found for this basename: UACOL, UAPR, USPG, UPH, UTP, UGL, UKET, UBIL, UHGB, UNIT, UROB, ULEU, UEPI, UWBC, URBC, UBAC, CAST, CRYS, UCOM, BILUA,  in the last 72 hours  MICROBIOLOGY: Recent Results (from the past 240 hour(s))  CSF CULTURE     Status: None   Collection Time    07/25/13  5:28 AM      Result Value Range Status   Specimen Description CSF   Final   Special Requests NONE   Final   Gram Stain     Final   Value: CYTOSPIN NO WBC SEEN     NO ORGANISMS SEEN     Gram Stain Report Called to,Read Back By and Verified With: Gram Stain Report Called to,Read Back By and Verified With: Carmelia Roller RN 07/25/13 0747 BY JONESJ Performed at [facility]     Performed at [facility]   Culture     Final   Value: NO GROWTH 3 DAYS     Performed at [facility]   Report Status 07/28/2013 FINAL   Final  GRAM STAIN     Status: None   Collection Time    07/25/13  5:28 AM      Result Value Range Status   Specimen Description CSF   Final   Special Requests NONE   Final   Gram Stain     Final   Value: WBC PRESENT, PREDOMINANTLY MONONUCLEAR     NO ORGANISMS SEEN     Gram Stain Report Called to,Read Back By and Verified With: WILLIAMS  M.,RN 07/25/13 0747 BY JONESJ   Report Status 07/25/2013 FINAL   Final  CLOSTRIDIUM DIFFICILE BY PCR     Status: Abnormal   Collection Time    07/25/13 11:57 AM      Result Value Range Status   C difficile by pcr POSITIVE (*) NEGATIVE Final   Comment: CRITICAL RESULT CALLED TO, READ BACK BY AND VERIFIED WITH:     ADKINS C,RN [id number] 07/25/13 [facility]  CULTURE, BLOOD (ROUTINE X 2)     Status: None   Collection Time    07/25/13  1:00 PM      Result Value Range Status   Specimen Description BLOOD LEFT ARM   Final   Special Requests BOTTLES DRAWN AEROBIC AND ANAEROBIC 10CC   Final   Culture  Setup Time     Final   Value: 07/25/2013 22:04     Performed at [facility]   Culture     Final   Value:        BLOOD CULTURE RECEIVED NO GROWTH TO DATE CULTURE WILL BE HELD FOR 5 DAYS BEFORE  ISSUING A FINAL NEGATIVE REPORT     Performed at [facility]   Report Status PENDING   Incomplete  MRSA PCR SCREENING     Status: None   Collection Time    07/25/13  3:16 PM      Result Value Range Status   MRSA by PCR NEGATIVE  NEGATIVE Final   Comment:            The GeneXpert MRSA Assay (FDA     approved for NASAL specimens     only), is one component of a     comprehensive MRSA colonization     surveillance program. It is not     intended to diagnose MRSA     infection nor to guide or     monitor treatment for     MRSA infections.  URINE CULTURE     Status: None   Collection Time    07/25/13  3:37 PM      Result Value Range Status   Specimen Description URINE, CATHETERIZED   Final   Special Requests NONE   Final   Culture  Setup Time     Final   Value: 07/25/2013 21:30     Performed at [facility]     Final   Value: NO GROWTH     Performed at [facility]   Culture     Final   Value: NO GROWTH     Performed at [facility]   Report Status 07/26/2013 FINAL   Final    RADIOLOGY STUDIES/RESULTS: Ct Head Wo Contrast  07/25/2013    CLINICAL DATA:  Stroke  EXAM: CT HEAD WITHOUT CONTRAST  TECHNIQUE: Contiguous axial images were obtained from the base of the skull through the vertex without intravenous contrast.  COMPARISON:  MRI and CT No new large vessel territory infarct identified. No acute intracranial hemorrhage. No mass or midline shift. No extra-axial fluid collection. Mild chronic microvascular ischemic changes again noted.  Calvarium is intact. Orbits are within normal limits. Paranasal sinuses and mastoid air cells are clear. From 07/24/2013  FINDINGS: Vague hypodensity seen in region of previously identified ischemic infarct within the left frontal lobe is again seen.  IMPRESSION: Similar size and distribution of subacute left frontal lobe ischemic infarct. No new large vessel territory infarct or intracranial hemorrhage identified.  Mild chronic microvascular ischemic disease, unchanged.   Electronically Signed   By: Jeannine Boga M.D.   On: 07/25/2013 04:21   Ct Head Wo Contrast  07/24/2013   CLINICAL DATA:  Slurred speech and 9 ankle date and mental status change for preceding 3 days  EXAM: CT HEAD WITHOUT CONTRAST  TECHNIQUE: Contiguous axial images were obtained from the base of the skull through the vertex without intravenous contrast.  COMPARISON:  MRI of the brain dated June 27, 2013  FINDINGS: The ventricles are normal in size and position. There is subtle decreased density in the deep white matter of the left frontal lobe consistent with known recent infarcts. I do not see evidence of an acute intracranial hemorrhage. No abnormality in the right cerebral hemisphere is demonstrated. The cerebellum and brainstem exhibit no acute abnormalities.  At bone window settings there is no lytic nor blastic lesion or evidence of a skull fracture. The observed portions of the paranasal sinuses and mastoid air cells are clear.  IMPRESSION: There is no evidence of an acute ischemic or hemorrhagic infarction. There are ongoing  changes in  the left frontal lobe consistent with the recent ischemic event. If there are strong clinical concerns of new ischemic symptoms, MRI now would be useful.   Electronically Signed   By: David  Martinique   On: 07/24/2013 13:03   Mr Brain Wo Contrast  07/24/2013   CLINICAL DATA:  aphasia.  Gait disturbance.  Confusion.  EXAM: MRI HEAD WITHOUT CONTRAST  TECHNIQUE: Multiplanar, multiecho pulse sequences of the brain and surrounding structures were obtained without intravenous contrast.  COMPARISON:  Head CT same day.  MRI 06/27/2013.  FINDINGS: Diffusion imaging does not show any new or acute insult. There is minimal residual restricted diffusion at the site of infarction which was acute in December in the left posterior frontal cortical and subcortical brain.  The brainstem and cerebellum are normal. There are mild chronic small-vessel changes affecting the cerebral hemispheric white matter. No mass lesion, hemorrhage, hydrocephalus or extra-axial collection. No pituitary mass. No significant sinus disease. No skull or skullbase lesion.  IMPRESSION: No new infarction when compared to the study of December. Some persistent residual restricted diffusion in the area of infarction in the left posterior frontal region which was acute in December.  Mild chronic small-vessel changes elsewhere affecting the cerebral hemispheric white matter.   Electronically Signed   By: Nelson Chimes M.D.   On: 07/24/2013 16:38   Dg Chest Port 1 View  07/25/2013   CLINICAL DATA:  Right central line placement, OG tube placement  EXAM: PORTABLE CHEST - 1 VIEW  COMPARISON:  07/25/2013 at 1343 hr  FINDINGS: Lungs are clear. No pleural effusion or pneumothorax.  Endotracheal tube terminates 3.5 cm above the carina.  The heart is normal in size.  Right IJ venous catheter terminates in the lower SVC. Enteric tube courses into the stomach.  IMPRESSION: Endotracheal tube terminates 3.5 cm above the carina.  Right IJ venous catheter  terminates in the lower SVC. No pneumothorax.   Electronically Signed   By: Julian Hy M.D.   On: 07/25/2013 16:30   Dg Chest Port 1 View  07/25/2013   CLINICAL DATA:  Diabetes, fever, nausea, vomiting  EXAM: PORTABLE CHEST - 1 VIEW  COMPARISON:  06/27/2013  FINDINGS: The heart size and mediastinal contours are within normal limits. Both lungs are clear. The visualized skeletal structures are unremarkable.  IMPRESSION: No active disease.   Electronically Signed   By: Daryll Brod M.D.   On: 07/25/2013 14:06    Oren Binet, MD  [facility] Pager:[phone]  If 7PM-7AM, please contact night-coverage [email or web address] Password [email or web address] 07/30/2013, 8:50 AM   LOS: 5 days

## 2013-07-30 NOTE — Discharge Summary (Signed)
PATIENT DETAILS Name: [name] Age: 73 y.o. Sex: female Date of Birth: [birth year] MRN: [id number]. Admit Date: 07/25/2013 Admitting Physician: Geradine Girt, DO FYB:[id number],HENI CHARLES, MD  Recommendations for Outpatient Follow-up:  1. Judicious use of antibiotics-first episode of C Diff  PRIMARY DISCHARGE DIAGNOSIS:  Active Problems:   HTN (hypertension)   Type II or unspecified type diabetes mellitus with neurological manifestations, not stated as uncontrolled   S/P recent Left carotid endarterectomy   Delirium   Sepsis   Acute respiratory failure   C. difficile colitis   Dementia      PAST MEDICAL HISTORY: Past Medical History  Diagnosis Date  . Neuropathy, diabetic   . Dyslipidemia     takes Crestor daily  . Smoker   . Hypertension     takes Prinzide and Verapamil daily  . Diabetes mellitus     takes Janumet daily  . PONV (postoperative nausea and vomiting)   . Impaired speech     from stroke  . Vertigo     but doesn't take any meds  . Stroke 06/25/13    DISCHARGE MEDICATIONS:   Medication List         aspirin 325 MG EC tablet  Take 1 tablet (325 mg total) by mouth daily.     lisinopril-hydrochlorothiazide 20-12.5 MG per tablet  Commonly known as:  PRINZIDE,ZESTORETIC  Take 1 tablet by mouth daily.     metroNIDAZOLE 500 MG tablet  Commonly known as:  FLAGYL  Take 1 tablet (500 mg total) by mouth every 8 (eight) hours.     rosuvastatin 20 MG tablet  Commonly known as:  CRESTOR  Take 1 tablet (20 mg total) by mouth daily.     sitaGLIPtin-metformin 50-1000 MG per tablet  Commonly known as:  JANUMET  Take 1 tablet by mouth 2 (two) times daily with a meal.     traMADol 50 MG tablet  Commonly known as:  ULTRAM  Take 1 tablet (50 mg total) by mouth every 6 (six) hours as needed.     verapamil 240 MG CR tablet  Commonly known as:  CALAN-SR  Take 240 mg by mouth daily.        ALLERGIES:   Allergies  Allergen Reactions  . Codeine  Nausea And Vomiting  . Lipitor [Atorvastatin] Nausea And Vomiting  . Phenergan [Promethazine Hcl] Other (See Comments)    Confusion, hallucinations, severe aggitation    BRIEF HPI:  See H&P, Labs, Consult and Test reports for all details in brief,a 73 year old female w/ recent h/o CVA, presented to ER 1/10 w/ progressive MS change & diarrhea.   CONSULTATIONS:   pulmonary/intensive care and vascular surgery  PERTINENT RADIOLOGIC STUDIES: Ct Head Wo Contrast  07/25/2013   CLINICAL DATA:  Stroke  EXAM: CT HEAD WITHOUT CONTRAST  TECHNIQUE: Contiguous axial images were obtained from the base of the skull through the vertex without intravenous contrast.  COMPARISON:  MRI and CT No new large vessel territory infarct identified. No acute intracranial hemorrhage. No mass or midline shift. No extra-axial fluid collection. Mild chronic microvascular ischemic changes again noted.  Calvarium is intact. Orbits are within normal limits. Paranasal sinuses and mastoid air cells are clear. From 07/24/2013  FINDINGS: Vague hypodensity seen in region of previously identified ischemic infarct within the left frontal lobe is again seen.  IMPRESSION: Similar size and distribution of subacute left frontal lobe ischemic infarct. No new large vessel territory infarct or intracranial hemorrhage identified.  Mild chronic microvascular  ischemic disease, unchanged.   Electronically Signed   By: Jeannine Boga M.D.   On: 07/25/2013 04:21   Ct Head Wo Contrast  07/24/2013   CLINICAL DATA:  Slurred speech and 9 ankle date and mental status change for preceding 3 days  EXAM: CT HEAD WITHOUT CONTRAST  TECHNIQUE: Contiguous axial images were obtained from the base of the skull through the vertex without intravenous contrast.  COMPARISON:  MRI of the brain dated June 27, 2013  FINDINGS: The ventricles are normal in size and position. There is subtle decreased density in the deep white matter of the left frontal lobe consistent  with known recent infarcts. I do not see evidence of an acute intracranial hemorrhage. No abnormality in the right cerebral hemisphere is demonstrated. The cerebellum and brainstem exhibit no acute abnormalities.  At bone window settings there is no lytic nor blastic lesion or evidence of a skull fracture. The observed portions of the paranasal sinuses and mastoid air cells are clear.  IMPRESSION: There is no evidence of an acute ischemic or hemorrhagic infarction. There are ongoing changes in the left frontal lobe consistent with the recent ischemic event. If there are strong clinical concerns of new ischemic symptoms, MRI now would be useful.   Electronically Signed   By: David  Martinique   On: 07/24/2013 13:03   Mr Brain Wo Contrast  07/24/2013   CLINICAL DATA:  aphasia.  Gait disturbance.  Confusion.  EXAM: MRI HEAD WITHOUT CONTRAST  TECHNIQUE: Multiplanar, multiecho pulse sequences of the brain and surrounding structures were obtained without intravenous contrast.  COMPARISON:  Head CT same day.  MRI 06/27/2013.  FINDINGS: Diffusion imaging does not show any new or acute insult. There is minimal residual restricted diffusion at the site of infarction which was acute in December in the left posterior frontal cortical and subcortical brain.  The brainstem and cerebellum are normal. There are mild chronic small-vessel changes affecting the cerebral hemispheric white matter. No mass lesion, hemorrhage, hydrocephalus or extra-axial collection. No pituitary mass. No significant sinus disease. No skull or skullbase lesion.  IMPRESSION: No new infarction when compared to the study of December. Some persistent residual restricted diffusion in the area of infarction in the left posterior frontal region which was acute in December.  Mild chronic small-vessel changes elsewhere affecting the cerebral hemispheric white matter.   Electronically Signed   By: Nelson Chimes M.D.   On: 07/24/2013 16:38   Dg Chest Port 1  View  07/25/2013   CLINICAL DATA:  Right central line placement, OG tube placement  EXAM: PORTABLE CHEST - 1 VIEW  COMPARISON:  07/25/2013 at 1343 hr  FINDINGS: Lungs are clear. No pleural effusion or pneumothorax.  Endotracheal tube terminates 3.5 cm above the carina.  The heart is normal in size.  Right IJ venous catheter terminates in the lower SVC. Enteric tube courses into the stomach.  IMPRESSION: Endotracheal tube terminates 3.5 cm above the carina.  Right IJ venous catheter terminates in the lower SVC. No pneumothorax.   Electronically Signed   By: Julian Hy M.D.   On: 07/25/2013 16:30   Dg Chest Port 1 View  07/25/2013   CLINICAL DATA:  Diabetes, fever, nausea, vomiting  EXAM: PORTABLE CHEST - 1 VIEW  COMPARISON:  06/27/2013  FINDINGS: The heart size and mediastinal contours are within normal limits. Both lungs are clear. The visualized skeletal structures are unremarkable.  IMPRESSION: No active disease.   Electronically Signed   By: Daryll Brod  M.D.   On: 07/25/2013 14:06     PERTINENT LAB RESULTS: CBC:  Recent Labs  07/28/13 0315 07/29/13 0550  WBC 15.7* 14.2*  HGB 10.3* 11.0*  HCT 31.3* 32.9*  PLT 353 383   CMET CMP     Component Value Date/Time   NA 140 07/29/2013 0550   K 3.4* 07/29/2013 0550   CL 103 07/29/2013 0550   CO2 21 07/29/2013 0550   GLUCOSE 133* 07/29/2013 0550   BUN 6 07/29/2013 0550   CREATININE 0.55 07/29/2013 0550   CREATININE 0.84 10/09/2012 1009   CALCIUM 7.9* 07/29/2013 0550   PROT 5.5* 07/28/2013 0315   ALBUMIN 2.7* 07/28/2013 0315   AST 10 07/28/2013 0315   ALT 7 07/28/2013 0315   ALKPHOS 54 07/28/2013 0315   BILITOT 0.5 07/28/2013 0315   GFRNONAA >90 07/29/2013 0550   GFRAA >90 07/29/2013 0550    GFR Estimated Creatinine Clearance: 59.5 ml/min (by C-G formula based on Cr of 0.55). No results found for this basename: LIPASE, AMYLASE,  in the last 72 hours No results found for this basename: CKTOTAL, CKMB, CKMBINDEX, TROPONINI,  in the last 72  hours No components found with this basename: POCBNP,  No results found for this basename: DDIMER,  in the last 72 hours No results found for this basename: HGBA1C,  in the last 72 hours No results found for this basename: CHOL, HDL, LDLCALC, TRIG, CHOLHDL, LDLDIRECT,  in the last 72 hours No results found for this basename: TSH, T4TOTAL, FREET3, T3FREE, THYROIDAB,  in the last 72 hours No results found for this basename: VITAMINB12, FOLATE, FERRITIN, TIBC, IRON, RETICCTPCT,  in the last 72 hours Coags: No results found for this basename: PT, INR,  in the last 72 hours Microbiology: Recent Results (from the past 240 hour(s))  CSF CULTURE     Status: None   Collection Time    07/25/13  5:28 AM      Result Value Range Status   Specimen Description CSF   Final   Special Requests NONE   Final   Gram Stain     Final   Value: CYTOSPIN NO WBC SEEN     NO ORGANISMS SEEN     Gram Stain Report Called to,Read Back By and Verified With: Gram Stain Report Called to,Read Back By and Verified With: Carmelia Roller RN 07/25/13 0747 BY JONESJ Performed at [facility]     Performed at [facility]   Culture     Final   Value: NO GROWTH 3 DAYS     Performed at [facility]   Report Status 07/28/2013 FINAL   Final  GRAM STAIN     Status: None   Collection Time    07/25/13  5:28 AM      Result Value Range Status   Specimen Description CSF   Final   Special Requests NONE   Final   Gram Stain     Final   Value: WBC PRESENT, PREDOMINANTLY MONONUCLEAR     NO ORGANISMS SEEN     Gram Stain Report Called to,Read Back By and Verified With: WILLIAMS M.,RN 07/25/13 0747 BY JONESJ   Report Status 07/25/2013 FINAL   Final  CLOSTRIDIUM DIFFICILE BY PCR     Status: Abnormal   Collection Time    07/25/13 11:57 AM      Result Value Range Status   C difficile by pcr POSITIVE (*) NEGATIVE Final   Comment: CRITICAL RESULT CALLED TO, READ  BACK BY AND VERIFIED WITH:     ADKINS C,RN 1332 07/25/13  SCALES H  CULTURE, BLOOD (ROUTINE X 2)     Status: None   Collection Time    07/25/13  1:00 PM      Result Value Range Status   Specimen Description BLOOD LEFT ARM   Final   Special Requests BOTTLES DRAWN AEROBIC AND ANAEROBIC 10CC   Final   Culture  Setup Time     Final   Value: 07/25/2013 22:04     Performed at [facility]   Culture     Final   Value:        BLOOD CULTURE RECEIVED NO GROWTH TO DATE CULTURE WILL BE HELD FOR 5 DAYS BEFORE ISSUING A FINAL NEGATIVE REPORT     Performed at [facility]   Report Status PENDING   Incomplete  MRSA PCR SCREENING     Status: None   Collection Time    07/25/13  3:16 PM      Result Value Range Status   MRSA by PCR NEGATIVE  NEGATIVE Final   Comment:            The GeneXpert MRSA Assay (FDA     approved for NASAL specimens     only), is one component of a     comprehensive MRSA colonization     surveillance program. It is not     intended to diagnose MRSA     infection nor to guide or     monitor treatment for     MRSA infections.  URINE CULTURE     Status: None   Collection Time    07/25/13  3:37 PM      Result Value Range Status   Specimen Description URINE, CATHETERIZED   Final   Special Requests NONE   Final   Culture  Setup Time     Final   Value: 07/25/2013 21:30     Performed at [place]     Final   Value: NO GROWTH     Performed at [facility]   Culture     Final   Value: NO GROWTH     Performed at [facility]   Report Status 07/26/2013 FINAL   Final     BRIEF HOSPITAL COURSE:  Acute encephalopathy  - Secondary to C. difficile sepsis  - Resolved.   Workup so far:  1/09 CT head >> subacute Lt frontal stroke  1/09 MRI brain >> subacute Lt frontal stroke  1/10 CT head >> subacute Lt frontal stroke  1/10 LP >> negative for meningitis, cultures negative  4/40 EEG >>> metabolic encephalopathy   Severe sepsis  - Secondary to C. difficile colitis  -  Resolved   Microbiology:  CSF culture 1/10>> negative  Blood culture 1/10>> negative  Urine culture 1/10>> negative  1/10 C.dif PCR >> POSITIVE   Antibiotics: Vancomycin 1/10 x 1  Rocephin 1/10 x 1  Oral Vancomycin 1/10 >>> 1/13  Flagyl 1/10 >>>  C. difficile colitis  - Continue Flagyl, will plan a total of 2 weeks therapy  - diarrhea has almost resolved   History of recent CVA  - Continue aspirin  - Can resume statins  - Status post recent left carotid arterectomy   Hypertension  - Much better controlled  - Continue verapamil and lisinopril HCTZ   Diabetes  - CBGs stable, continue sliding scale insulin  - Resume  oral hypoglycemics on discharge  TODAY-DAY OF DISCHARGE:  Subjective:   [name] today has no headache,no chest abdominal pain,no new weakness tingling or numbness, feels much better wants to go home today.   Objective:   Blood pressure 153/71, pulse 72, temperature 98.7 F (37.1 C), temperature source Oral, resp. rate 20, height 5\' 6"  (1.676 m), weight 70.2 kg (154 lb 12.2 oz), SpO2 94.00%. No intake or output data in the 24 hours ending 07/30/13 0858 Filed Weights   07/26/13 0315 07/27/13 0400 07/28/13 2054  Weight: 69.5 kg (153 lb 3.5 oz) 70.1 kg (154 lb 8.7 oz) 70.2 kg (154 lb 12.2 oz)    Exam Awake Alert, Oriented *3, No new F.N deficits, Normal affect [place].AT,PERRAL Supple Neck,No JVD, No cervical lymphadenopathy appriciated.  Symmetrical Chest wall movement, Good air movement bilaterally, CTAB RRR,No Gallops,Rubs or new Murmurs, No Parasternal Heave +ve B.Sounds, Abd Soft, Non tender, No organomegaly appriciated, No rebound -guarding or rigidity. No Cyanosis, Clubbing or edema, No new Rash or bruise  DISCHARGE CONDITION: Stable  DISPOSITION: Home with home health services  DISCHARGE INSTRUCTIONS:    Activity:  As tolerated with Full fall precautions use walker/cane & assistance as needed  Diet recommendation: Diabetic Diet Heart  Healthy diet  Discharge Orders   Future Appointments Provider Department Dept Phone   07/30/2013 1:30 PM Elam Dutch, MD [facility] [phone]   08/03/2013 10:30 AM Ephraim Hamburger, [place] [phone]   08/05/2013 10:15 AM Sheffield Slider, CCC-SLP [place] [facility] [phone]   08/10/2013 10:30 AM Conchita Paris [facility] [phone]   08/12/2013 10:15 AM Sheffield Slider, [place] [phone]   09/08/2013 2:15 PM Garvin Fila, MD [facility] [phone]   09/10/2013 9:15 AM [name], MD [place] [phone]   Future Orders Complete By Expires   Call MD for:  persistant nausea and vomiting  As directed    Diet - low sodium heart healthy  As directed    Diet general  As directed    Increase activity slowly  As directed       Follow-up Information   Follow up with Wyatt Haste, MD. Schedule an appointment as soon as possible for a visit in 1 week.   Specialty:  Family Medicine   Contact information:   [address] [place] [address] [phone]       Follow up with Elam Dutch, MD. Schedule an appointment as soon as possible for a visit in 1 week.   Specialty:  Vascular Surgery   Contact information:   [address] [place] [address] [phone]       Total Time spent on discharge equals 45 minutes.  SignedOren Binet 07/30/2013 8:58 AM

## 2013-07-30 NOTE — Telephone Encounter (Addendum)
Message copied by Gena Fray on Thu Jul 30, 2013  2:55 PM ------      Message from: Ruta Hinds E      Created: Thu Jul 30, 2013  2:22 PM      Regarding: RE: ? follow up       She can have carotid duplex in 6 months.  I did her post op visit in the hospital.  If she wants  She can see me again in a few weeks but not necessary.            Charles      ----- Message -----         From: Gena Fray         Sent: 07/30/2013   1:13 PM           To: Elam Dutch, MD      Subject: ? follow up                                              [name] was scheduled to see you today 01/15 at 1:30, however did not show as she was just released from [facility] after care for cdiff sepsis. The patients daughter called to ask if they need to reschedule this follow up.  I did see a progress note from you on 07/27/13 that stated her incisions had healed well. Please advise.            Thanks       Hinton Dyer       ------  07/30/13: I spoke with patient and her daughter [name]. They are fine with coming back in 6 months. She knows to call if she has issues. dpm

## 2013-07-31 LAB — CULTURE, BLOOD (ROUTINE X 2): CULTURE: NO GROWTH

## 2013-08-03 ENCOUNTER — Ambulatory Visit (HOSPITAL_COMMUNITY): Payer: Medicare Other | Admitting: Speech Pathology

## 2013-08-05 ENCOUNTER — Ambulatory Visit (HOSPITAL_COMMUNITY): Payer: Medicare Other | Admitting: Speech Pathology

## 2013-08-06 ENCOUNTER — Encounter: Payer: Self-pay | Admitting: Family Medicine

## 2013-08-06 ENCOUNTER — Ambulatory Visit (INDEPENDENT_AMBULATORY_CARE_PROVIDER_SITE_OTHER): Payer: Medicare Other | Admitting: Family Medicine

## 2013-08-06 VITALS — BP 142/70 | HR 72 | Wt 143.0 lb

## 2013-08-06 DIAGNOSIS — A0472 Enterocolitis due to Clostridium difficile, not specified as recurrent: Secondary | ICD-10-CM

## 2013-08-06 DIAGNOSIS — Z79899 Other long term (current) drug therapy: Secondary | ICD-10-CM

## 2013-08-06 DIAGNOSIS — I639 Cerebral infarction, unspecified: Secondary | ICD-10-CM

## 2013-08-06 DIAGNOSIS — I6350 Cerebral infarction due to unspecified occlusion or stenosis of unspecified cerebral artery: Secondary | ICD-10-CM

## 2013-08-06 DIAGNOSIS — D72829 Elevated white blood cell count, unspecified: Secondary | ICD-10-CM

## 2013-08-06 LAB — COMPREHENSIVE METABOLIC PANEL
ALK PHOS: 50 U/L (ref 39–117)
ALT: 10 U/L (ref 0–35)
AST: 13 U/L (ref 0–37)
Albumin: 4.2 g/dL (ref 3.5–5.2)
BUN: 14 mg/dL (ref 6–23)
CO2: 29 mEq/L (ref 19–32)
CREATININE: 0.81 mg/dL (ref 0.50–1.10)
Calcium: 9.5 mg/dL (ref 8.4–10.5)
Chloride: 99 mEq/L (ref 96–112)
GLUCOSE: 158 mg/dL — AB (ref 70–99)
POTASSIUM: 4.1 meq/L (ref 3.5–5.3)
Sodium: 138 mEq/L (ref 135–145)
Total Bilirubin: 0.5 mg/dL (ref 0.3–1.2)
Total Protein: 6.6 g/dL (ref 6.0–8.3)

## 2013-08-06 LAB — CBC WITH DIFFERENTIAL/PLATELET
BASOS ABS: 0 10*3/uL (ref 0.0–0.1)
BASOS PCT: 0 % (ref 0–1)
Eosinophils Absolute: 0.3 10*3/uL (ref 0.0–0.7)
Eosinophils Relative: 2 % (ref 0–5)
HCT: 38.6 % (ref 36.0–46.0)
HEMOGLOBIN: 12.6 g/dL (ref 12.0–15.0)
Lymphocytes Relative: 27 % (ref 12–46)
Lymphs Abs: 3.1 10*3/uL (ref 0.7–4.0)
MCH: 29.3 pg (ref 26.0–34.0)
MCHC: 32.6 g/dL (ref 30.0–36.0)
MCV: 89.8 fL (ref 78.0–100.0)
MONO ABS: 0.9 10*3/uL (ref 0.1–1.0)
MONOS PCT: 8 % (ref 3–12)
NEUTROS ABS: 7 10*3/uL (ref 1.7–7.7)
Neutrophils Relative %: 63 % (ref 43–77)
Platelets: 502 10*3/uL — ABNORMAL HIGH (ref 150–400)
RBC: 4.3 MIL/uL (ref 3.87–5.11)
RDW: 15.8 % — AB (ref 11.5–15.5)
WBC: 11.3 10*3/uL — ABNORMAL HIGH (ref 4.0–10.5)

## 2013-08-06 NOTE — Progress Notes (Signed)
   Subjective:    Patient ID: [name], female    DOB: [birth year], 73 y.o.   MRN: [id number]  HPI She is here for recheck after recent hospitalization. The hospital record was reviewed including the admission H&P and discharge summary. It is difficult to determine the exact hospital course based on the discharge summary however she was diagnosed with C. difficile colitis. She also had elevated white count. She slowly improved and has been home for one week. She has had no more difficulty with diarrhea. She still does feel weak and have difficulty with speech.    Review of Systems     Objective:   Physical Exam Alert and in no distress. Expressive aphasia is noted with her word searching. Left neck surgical scar seems to be healing nicely.      Assessment & Plan:  Elevated white blood cell count  C. difficile colitis  Stroke  Encounter for long-term (current) use of other medications  at this point she does seem to be doing much better. I will continue her on Flagyl for one more week. We'll followup on the white count and general blood screening 25 minutes spent left the patient and her daughter.

## 2013-08-07 ENCOUNTER — Telehealth: Payer: Self-pay | Admitting: Family Medicine

## 2013-08-07 MED ORDER — METRONIDAZOLE 500 MG PO TABS: 500.0000 mg | ORAL_TABLET | Freq: Three times a day (TID) | ORAL | Status: DC

## 2013-08-07 NOTE — Telephone Encounter (Signed)
Sent in med to pharmacy for another week supply

## 2013-08-10 ENCOUNTER — Encounter: Payer: Self-pay | Admitting: Family Medicine

## 2013-08-10 ENCOUNTER — Ambulatory Visit (INDEPENDENT_AMBULATORY_CARE_PROVIDER_SITE_OTHER): Payer: Medicare Other | Admitting: Family Medicine

## 2013-08-10 ENCOUNTER — Ambulatory Visit (HOSPITAL_COMMUNITY): Payer: Medicare Other | Admitting: Speech Pathology

## 2013-08-10 VITALS — BP 130/72 | HR 98

## 2013-08-10 DIAGNOSIS — L299 Pruritus, unspecified: Secondary | ICD-10-CM

## 2013-08-10 NOTE — Progress Notes (Signed)
   Subjective:    Patient ID: [name], female    DOB: 12/06/1940, 73 y.o.   MRN: [id number]  HPI  She is here for evaluation of itching. This started yesterday around 1 in the afternoon. She noted no change in her medications earlier in the day. She notes no foods that bother her. The itching has continued. She did take her pills morning and noted no change. No new soaps, detergents, fever, chills, sore throat, congestion.   Review of Systems     Objective:   Physical Exam alert and in no distress. Tympanic membranes and canals are normal. Throat is clear. Tonsils are normal. Neck is supple without adenopathy or thyromegaly. Cardiac exam shows a regular sinus rhythm with a 1/6 murmur no gallops. Lungs are clear to auscultation. Exam of her skin shows no erythema, wheal-and-flare or other skin related issues.       Assessment & Plan:  Pruritus  I recommended using Benadryl and paying attention to see if there is a pattern to the itching specifically in regard to foods, medications, soaps or detergents.

## 2013-08-10 NOTE — Patient Instructions (Addendum)
Pay attention to if this gets worse what have you just done or eaten or pill you've taken . You can take Benadryl 3 times per day

## 2013-08-11 ENCOUNTER — Telehealth: Payer: Self-pay | Admitting: Internal Medicine

## 2013-08-11 NOTE — Telephone Encounter (Signed)
lm

## 2013-08-11 NOTE — Telephone Encounter (Signed)
[facility] called stating that pt is broke out in whelps in pelvic area and stomach. She had tried benadryl and hydrocortisone cream and nothing is helping. She wants to know if you can send something in for her.

## 2013-08-12 ENCOUNTER — Ambulatory Visit (INDEPENDENT_AMBULATORY_CARE_PROVIDER_SITE_OTHER): Payer: Medicare Other | Admitting: Family Medicine

## 2013-08-12 ENCOUNTER — Ambulatory Visit (HOSPITAL_COMMUNITY): Payer: Medicare Other | Admitting: Speech Pathology

## 2013-08-12 ENCOUNTER — Encounter: Payer: Self-pay | Admitting: Family Medicine

## 2013-08-12 VITALS — BP 128/70 | HR 66 | Ht 66.5 in | Wt 144.0 lb

## 2013-08-12 DIAGNOSIS — T7840XA Allergy, unspecified, initial encounter: Secondary | ICD-10-CM

## 2013-08-12 MED ORDER — METHYLPREDNISOLONE SODIUM SUCC 125 MG IJ SOLR: 125.0000 mg | Freq: Once | INTRAMUSCULAR | Status: DC

## 2013-08-12 MED ORDER — PREDNISONE 5 MG PO KIT: PACK | ORAL | Status: DC

## 2013-08-12 MED ORDER — METHYLPREDNISOLONE SODIUM SUCC 125 MG IJ SOLR
125.0000 mg | Freq: Once | INTRAMUSCULAR | Status: AC
  Administered 2013-08-12: 125 mg via INTRAMUSCULAR

## 2013-08-12 NOTE — Addendum Note (Signed)
Addended by: Carolee Rota F on: 08/12/2013 12:30 PM   Modules accepted: Orders, Medications

## 2013-08-12 NOTE — Progress Notes (Signed)
   Subjective:    Patient ID: [name], female    DOB: [birth year], 73 y.o.   MRN: [id number]  HPI She continues to have difficulty with itching and now has developed a rash in the axilla as well as under her breasts and to a lesser extent on her abdomen.   Review of Systems     Objective:   Physical Exam Slight erythema is noted with some wheal and flare reaction in the left axilla as well as underneath her left lateral breast area.       Assessment & Plan:  Allergic reaction - Plan: PredniSONE 5 MG KIT, methylPREDNISolone sodium succinate (SOLU-MEDROL) 125 mg/2 mL injection  since she is so miserable I will give her steroids. Discussed the fact that will also make her blood sugar go up. She will keep me informed .

## 2013-08-17 ENCOUNTER — Ambulatory Visit: Payer: Medicare Other | Admitting: Family Medicine

## 2013-08-17 ENCOUNTER — Ambulatory Visit (INDEPENDENT_AMBULATORY_CARE_PROVIDER_SITE_OTHER): Payer: Medicare Other | Admitting: Family Medicine

## 2013-08-17 VITALS — BP 152/76 | HR 85 | Wt 144.0 lb

## 2013-08-17 DIAGNOSIS — I639 Cerebral infarction, unspecified: Secondary | ICD-10-CM

## 2013-08-17 DIAGNOSIS — A0472 Enterocolitis due to Clostridium difficile, not specified as recurrent: Secondary | ICD-10-CM

## 2013-08-17 DIAGNOSIS — I6350 Cerebral infarction due to unspecified occlusion or stenosis of unspecified cerebral artery: Secondary | ICD-10-CM

## 2013-08-17 DIAGNOSIS — L299 Pruritus, unspecified: Secondary | ICD-10-CM

## 2013-08-17 NOTE — Progress Notes (Signed)
   Subjective:    Patient ID: [name], female    DOB: September 12, 1940, 73 y.o.   MRN: [id number]  HPI She is here for a followup. She states that the chin is much better. She did not start on the steroids due to fear of what he would do to her blood sugar. She recently finished her Flagyl for treatment of her C. difficile colitis. She does state that her bowel habits are back to normal although is difficult to get her up and down on this. She continues to just not feel good. Over the last several months she has had several major incidents occurring encouraging the CVA as well as carotid endarterectomy and most recently C. difficile colitis.   Review of Systems     Objective:   Physical Exam Alert and in no distress. Exam of her skin shows no visible lesions.       Assessment & Plan:  Pruritus  C. difficile colitis  Stroke  I discussed at length the fact that she is not feeling well can certainly be explained by all the problems she has had. She will call if her diarrhea recurs. Otherwise I will see her in several months.

## 2013-08-17 NOTE — Patient Instructions (Signed)
Use Dannon yogurt or Activia

## 2013-08-18 ENCOUNTER — Telehealth: Payer: Self-pay | Admitting: Family Medicine

## 2013-08-18 NOTE — Telephone Encounter (Signed)
lm

## 2013-09-08 ENCOUNTER — Encounter (HOSPITAL_COMMUNITY): Payer: Self-pay | Admitting: Emergency Medicine

## 2013-09-08 ENCOUNTER — Emergency Department (HOSPITAL_COMMUNITY)
Admission: EM | Admit: 2013-09-08 | Discharge: 2013-09-08 | Disposition: A | Discharge status: Home / Self Care | Payer: Medicare Other | Attending: Emergency Medicine | Admitting: Emergency Medicine

## 2013-09-08 ENCOUNTER — Emergency Department (HOSPITAL_COMMUNITY): Payer: Medicare Other

## 2013-09-08 ENCOUNTER — Ambulatory Visit: Payer: Medicare Other | Admitting: Neurology

## 2013-09-08 DIAGNOSIS — I10 Essential (primary) hypertension: Secondary | ICD-10-CM | POA: Insufficient documentation

## 2013-09-08 DIAGNOSIS — Z9889 Other specified postprocedural states: Secondary | ICD-10-CM | POA: Insufficient documentation

## 2013-09-08 DIAGNOSIS — F17200 Nicotine dependence, unspecified, uncomplicated: Secondary | ICD-10-CM | POA: Insufficient documentation

## 2013-09-08 DIAGNOSIS — Z7982 Long term (current) use of aspirin: Secondary | ICD-10-CM | POA: Insufficient documentation

## 2013-09-08 DIAGNOSIS — Z79899 Other long term (current) drug therapy: Secondary | ICD-10-CM | POA: Insufficient documentation

## 2013-09-08 DIAGNOSIS — IMO0002 Reserved for concepts with insufficient information to code with codable children: Secondary | ICD-10-CM | POA: Insufficient documentation

## 2013-09-08 DIAGNOSIS — E785 Hyperlipidemia, unspecified: Secondary | ICD-10-CM | POA: Insufficient documentation

## 2013-09-08 DIAGNOSIS — G459 Transient cerebral ischemic attack, unspecified: Secondary | ICD-10-CM | POA: Insufficient documentation

## 2013-09-08 DIAGNOSIS — E1149 Type 2 diabetes mellitus with other diabetic neurological complication: Secondary | ICD-10-CM | POA: Insufficient documentation

## 2013-09-08 DIAGNOSIS — E1142 Type 2 diabetes mellitus with diabetic polyneuropathy: Secondary | ICD-10-CM | POA: Insufficient documentation

## 2013-09-08 DIAGNOSIS — Z8619 Personal history of other infectious and parasitic diseases: Secondary | ICD-10-CM | POA: Insufficient documentation

## 2013-09-08 LAB — COMPREHENSIVE METABOLIC PANEL
ALBUMIN: 3.8 g/dL (ref 3.5–5.2)
ALT: 10 U/L (ref 0–35)
AST: 11 U/L (ref 0–37)
Alkaline Phosphatase: 88 U/L (ref 39–117)
BILIRUBIN TOTAL: 0.3 mg/dL (ref 0.3–1.2)
BUN: 20 mg/dL (ref 6–23)
CHLORIDE: 98 meq/L (ref 96–112)
CO2: 23 mEq/L (ref 19–32)
CREATININE: 0.76 mg/dL (ref 0.50–1.10)
Calcium: 9.5 mg/dL (ref 8.4–10.5)
GFR calc Af Amer: >90 mL/min (ref >90)
GFR calc non Af Amer: 82 mL/min — ABNORMAL LOW (ref >90)
Glucose, Bld: 229 mg/dL — ABNORMAL HIGH (ref 70–99)
Potassium: 4.1 mEq/L (ref 3.7–5.3)
Sodium: 140 mEq/L (ref 137–147)
Total Protein: 7.5 g/dL (ref 6.0–8.3)

## 2013-09-08 LAB — PROTIME-INR
INR: 0.9 (ref 0.00–1.49)
Prothrombin Time: 12 seconds (ref 11.6–15.2)

## 2013-09-08 LAB — CBC
HEMATOCRIT: 38.6 % (ref 36.0–46.0)
HEMOGLOBIN: 12.9 g/dL (ref 12.0–15.0)
MCH: 29.7 pg (ref 26.0–34.0)
MCHC: 33.4 g/dL (ref 30.0–36.0)
MCV: 88.7 fL (ref 78.0–100.0)
Platelets: 561 10*3/uL — ABNORMAL HIGH (ref 150–400)
RBC: 4.35 MIL/uL (ref 3.87–5.11)
RDW: 14.6 % (ref 11.5–15.5)
WBC: 17.9 10*3/uL — ABNORMAL HIGH (ref 4.0–10.5)

## 2013-09-08 LAB — DIFFERENTIAL
Basophils Absolute: 0 10*3/uL (ref 0.0–0.1)
Basophils Relative: 0 % (ref 0–1)
EOS ABS: 0.4 10*3/uL (ref 0.0–0.7)
Eosinophils Relative: 2 % (ref 0–5)
Lymphocytes Relative: 11 % — ABNORMAL LOW (ref 12–46)
Lymphs Abs: 1.9 10*3/uL (ref 0.7–4.0)
MONO ABS: 1 10*3/uL (ref 0.1–1.0)
MONOS PCT: 6 % (ref 3–12)
NEUTROS ABS: 14.6 10*3/uL — AB (ref 1.7–7.7)
Neutrophils Relative %: 81 % — ABNORMAL HIGH (ref 43–77)

## 2013-09-08 LAB — CBG MONITORING, ED
Glucose-Capillary: 226 mg/dL — ABNORMAL HIGH (ref 70–99)
Glucose-Capillary: 222 mg/dL — ABNORMAL HIGH (ref 70–99)

## 2013-09-08 LAB — APTT: aPTT: 29 seconds (ref 24–37)

## 2013-09-08 LAB — I-STAT TROPONIN, ED: TROPONIN I, POC: 0.01 ng/mL (ref 0.00–0.08)

## 2013-09-08 MED ORDER — CLOPIDOGREL BISULFATE 75 MG PO TABS: 75.0000 mg | ORAL_TABLET | Freq: Two times a day (BID) | ORAL | Status: DC

## 2013-09-08 NOTE — ED Notes (Signed)
Checked Pt CBG level was 222 mg/dL

## 2013-09-08 NOTE — ED Notes (Signed)
Pt A&Ox4, ambulatory at discharge.

## 2013-09-08 NOTE — ED Provider Notes (Signed)
CSN: [id number]     Arrival date & time 09/08/13  1448 History   First MD Initiated Contact with Patient 09/08/13 2017     Chief Complaint  Patient presents with  . Stroke Symptoms   HPI Comments: [name] hx of left frontal CVA 12/14, recent admission for C diff 1/15, presents with CC of paresthesias.  Pt states around 530 PM she started having left hand, left side of tongue, and left face tingling, paresthesias, with associated left visual blurring, without weakness or speech difficulty.  This lasted approximately 20 minutes, with complete resolution.  Denies fever, chills, headache, CP, SOB, abdominal pain, n/v/d, rash, myalgias, or any other symptoms.  Pt states she took full ASA today, along with her other prescribed medications.  Given hx of recent CVA pt wanted to be further evaluated.  She was supposed to have f/u with neurologist today, however due to inclement weather her appointment was cancelled.    The history is provided by the patient and a relative. No language interpreter was used.    Past Medical History  Diagnosis Date  . Neuropathy, diabetic   . Dyslipidemia     takes Crestor daily  . Smoker   . Hypertension     takes Prinzide and Verapamil daily  . Diabetes mellitus     takes Janumet daily  . PONV (postoperative nausea and vomiting)   . Impaired speech     from stroke  . Vertigo     but doesn't take any meds  . Stroke 06/25/13   Past Surgical History  Procedure Laterality Date  . Cholecystectomy    . Knee surgery Left 51yr ago  . Cataract removed Right   . Endarterectomy Left 07/14/2013    Procedure: ENDARTERECTOMY CAROTID-LEFT;  Surgeon: CElam Dutch MD;  Location: [facility]  Service: Vascular;  Laterality: Left;  . Patch angioplasty Left 07/14/2013    Procedure: LEFT CAROTID ARTERY PATCH ANGIOPLASTY;  Surgeon: CElam Dutch MD;  Location: [facility]  Service: Vascular;  Laterality: Left;   No family history on file. History  Substance Use Topics  .  Smoking status: Current Every Day Smoker  . Smokeless tobacco: Not on file     Comment: hasn't smoked since Sat,Dec 13  . Alcohol Use: No   OB History   Grav Para Term Preterm Abortions TAB SAB Ect Mult Living                 Review of Systems  Constitutional: Negative for fever and chills.  Respiratory: Negative for cough and shortness of breath.   Cardiovascular: Negative for chest pain, palpitations and leg swelling.  Gastrointestinal: Negative for nausea, vomiting, abdominal pain, diarrhea and constipation.  Musculoskeletal: Negative for myalgias.  Skin: Negative for rash.  Neurological: Positive for numbness. Negative for dizziness, syncope, speech difficulty, weakness, light-headedness and headaches.  Hematological: Negative for adenopathy. Does not bruise/bleed easily.  All other systems reviewed and are negative.      Allergies  Codeine; Lipitor; and Phenergan  Home Medications   Current Outpatient Rx  Name  Route  Sig  Dispense  Refill  . aspirin EC 325 MG EC tablet   Oral   Take 1 tablet (325 mg total) by mouth daily.   30 tablet   0   . lisinopril-hydrochlorothiazide (PRINZIDE,ZESTORETIC) 20-12.5 MG per tablet   Oral   Take 1 tablet by mouth daily.         . PredniSONE 5 MG KIT  Take 6 pills todsy,  5 the second, 4 the third, 3 fourth, 2 on the fifth and one on the last day   21 each   0   . rosuvastatin (CRESTOR) 20 MG tablet   Oral   Take 1 tablet (20 mg total) by mouth daily.   30 tablet   11   . sitaGLIPtin-metformin (JANUMET) 50-1000 MG per tablet   Oral   Take 1 tablet by mouth 2 (two) times daily with a meal.   60 tablet   5   . verapamil (CALAN-SR) 240 MG CR tablet   Oral   Take 240 mg by mouth daily.          BP 111/42  Pulse 92  Temp(Src) 98.1 F (36.7 C) (Oral)  Resp 14  SpO2 95% Physical Exam  Nursing note and vitals reviewed. Constitutional: She is oriented to person, place, and time. She appears well-developed  and well-nourished.  HENT:  Head: Normocephalic and atraumatic.  Right Ear: External ear normal.  Left Ear: External ear normal.  Nose: Nose normal.  Mouth/Throat: Oropharynx is clear and moist.  Eyes: Conjunctivae and EOM are normal. Pupils are equal, round, and reactive to light.  Neck: Normal range of motion. Neck supple.  Cardiovascular: Normal rate, regular rhythm, normal heart sounds and intact distal pulses.   Pulmonary/Chest: Effort normal and breath sounds normal. No respiratory distress. She has no wheezes. She has no rales. She exhibits no tenderness.  Abdominal: Soft. Bowel sounds are normal. She exhibits no distension and no mass. There is no tenderness. There is no rebound and no guarding.  Musculoskeletal: Normal range of motion.  Neurological: She is alert and oriented to person, place, and time.  Vision grossly intact.  Hearing grossly intact bilaterally.  No tongue deviation.  Shoulder shrug normal bilaterally, 5/5 strength.  No focal sensory or motor deficits globally.  Pt ambulates without complication.    Skin: Skin is warm and dry.    ED Course  Procedures (including critical care time) Labs Review Labs Reviewed  CBC - Abnormal; Notable for the following:    WBC 17.9 (*)    Platelets 561 (*)    All other components within normal limits  DIFFERENTIAL - Abnormal; Notable for the following:    Neutrophils Relative % 81 (*)    Neutro Abs 14.6 (*)    Lymphocytes Relative 11 (*)    All other components within normal limits  COMPREHENSIVE METABOLIC PANEL - Abnormal; Notable for the following:    Glucose, Bld 229 (*)    GFR calc non Af Amer 82 (*)    All other components within normal limits  CBG MONITORING, ED - Abnormal; Notable for the following:    Glucose-Capillary 226 (*)    All other components within normal limits  CBG MONITORING, ED - Abnormal; Notable for the following:    Glucose-Capillary 222 (*)    All other components within normal limits   PROTIME-INR  APTT  I-STAT TROPOININ, ED   Imaging Review Ct Head (brain) Wo Contrast  09/08/2013   CLINICAL DATA:  73 year old female status post left MCA infarct in December. Episode of extremity and face numbness and tingling. Weakness. Initial encounter.  EXAM: CT HEAD WITHOUT CONTRAST  TECHNIQUE: Contiguous axial images were obtained from the base of the skull through the vertex without intravenous contrast.  COMPARISON:  Head CT 07/25/2013 and earlier.  FINDINGS: Stable and largely negative paranasal sinuses and mastoids. No acute osseous abnormality identified.  Stable orbit and scalp soft tissues.  Calcified atherosclerosis at the skull base. Stable small focus of encephalomalacia left frontal lobe corresponding to the December injury. Stable cerebral volume. No ventriculomegaly. No suspicious intracranial vascular hyperdensity. Mild intracranial artery dolichoectasia. No midline shift, mass effect, or evidence of intracranial mass lesion. No acute intracranial hemorrhage identified. No evidence of cortically based acute infarction identified.  IMPRESSION: No acute intracranial abnormality. Stable sequelae of small left MCA infarct.   Electronically Signed   By: Lars Pinks M.D.   On: 09/08/2013 19:06    EKG Interpretation   None       MDM   Final diagnoses:  None   [name] hx of left frontal CVA 12/14, recent admission for C diff 1/15, presents with CC of paresthesias.  Filed Vitals:   09/08/13 2100  BP: 111/42  Pulse: 92  Temp:   Resp: 14   Physical exam as above.  Pt's vitals WNL.  Neurologic exam as detailed above, no focal neuro deficits noted, and pt currently asymptomatic.  EKG NSr, nonischemic.  Labs show hyperglycemia, similar to previous.  Pt with leukocytosis which is improved from prior.  CMP unremarkable.  Troponin WNL.  CT head shows no acute abnormality.  Concern for TIA.  Neurology consulted, and state pt does not require inpt admission at this time, and  recommends d/c ASA and starting pt on Plavix 75 mg tablet BID.  Pt's family would like to wait and talk to pt's personal neurologist before making this medication change.  We have discussed continuing ASA for now, and f/u with her personal neurologist for their recommendations.    Pt to be d/c home in good condition.  Encouraged to continue supportive care.  F/u with neurologist in 1 week.  Return precautions given.  Pt understands and agrees with plan.  I have discussed pt's care plan with Dr. Audie Pinto.  Sinda Du, MD      Sinda Du, MD 09/09/13 [phone]

## 2013-09-08 NOTE — Discharge Instructions (Signed)
Transient Ischemic Attack A transient ischemic attack (TIA) is a "warning stroke" that causes stroke-like symptoms. A TIA does not cause lasting damage to the brain. It is important to know when to get help and what to do to prevent stroke or death.  HOME CARE   Take all medicines exactly as told by your doctor. Understand all your medicine instructions.  You may need to take aspirin or warfarin medicine. Take warfarin exactly as told.  Taking too much or too little warfarin is dangerous. Blood tests must be done as often as told by your doctor. These blood tests help your doctor make sure the amount of warfarin you are taking is right. A PT blood test measures how long it takes for blood to clot. Your PT is used to calculate another value called an INR. Your PT and INR help your doctor adjust your warfarin dosage.  Food can cause problems with warfarin and affect the results of your blood tests. This is true for foods high in vitamin K. Spinach, kale, broccoli, cabbage, collard and turnip greens, brussels sprouts, peas, cauliflower, seaweed, and parsley are high in vitamin K as well as beef and pork liver, green tea, and soybean oil. Eat the same amount of food high in vitamin K. Avoid major changes in your diet. Tell your doctor before changing your diet. Talk to a food specialist (dietitian) if you have questions.  Many medicines can cause problems with warfarin and affect your PT and INR. Tell your doctor about all medicines you take. This includes vitamins and dietary pills (supplements). Be careful with aspirin and medicines that relieve redness, soreness, and puffiness (inflammation). Do not take or stop medicines unless your doctor tells you to.  Warfarin can cause a lot of bruising or bleeding. Hold pressure over cuts for longer than normal. Talk to your doctor about other side effects of warfarin.  Avoid sports or activities that may cause injury or bleeding.  Be careful when you shave,  floss your teeth, or use sharp objects.  Avoid alcoholic drinks or drink very little alcohol while taking warfarin. Tell your doctor if you change how much alcohol you drink.  Tell your dentist and other doctors that you take warfarin before procedures.  Eat 5 or more servings of fruits and vegetables a day.  Follow your diet program as told, if you are given one.  Keep a healthy weight.  Stay active. Try to get at least 30 minutes of activity on most or all days.  Do not smoke.  Limit how much alcohol you drink even if you are not taking warfarin. Moderate alcohol use is:  No more than 2 drinks each day for men.  No more than 1 drink each day for women who are not pregnant.  Stop abusing drugs.  Keep your home safe so you do not fall. Try:  Putting grab bars in the bedroom and bathroom.  Raising toilet seats.  Putting a seat in the shower.  Keep all doctor visits a told. GET HELP IF:  Your personality changes.  You have trouble swallowing.  You are seeing two of everything.  You are dizzy.  You have a fever.  Your skin starts to break down. GET HELP RIGHT AWAY IF:  The symptoms below may be a sign of an emergency. Do not wait to see if the symptoms go away. Call for help (911 in [place] Do not drive yourself to the hospital.  You have sudden weakness or numbness on   the face, arm, or leg (especially on one side of the body).  You have sudden trouble walking or moving your arms or legs.  You have sudden confusion.  You have trouble talking or understanding.  You have sudden trouble seeing in one or both eyes.  You lose your balance or your movements are not smooth.  You have a sudden, severe headache with no known cause.  You have new chest pain or you feel your heart beating in a unsteady way.  You are partly or totally unaware of what is going on around you. MAKE SURE YOU:   Understand these instructions.  Will watch your condition.  Will get  help right away if you are not doing well or get worse. Document Released: 04/10/2008 Document Revised: 06/18/2012 Document Reviewed: 08/25/2009 Exit[employer] Patient Information 2014 [employer].  

## 2013-09-08 NOTE — ED Notes (Signed)
Pt has history of stroke in December and today had a period of 20 minutes with left hand numbness and weakness, and left face tingling.  Pt has no deficits at this time.  CBg 226.

## 2013-09-09 ENCOUNTER — Telehealth: Payer: Self-pay | Admitting: Neurology

## 2013-09-09 ENCOUNTER — Other Ambulatory Visit: Payer: Self-pay | Admitting: Vascular Surgery

## 2013-09-09 ENCOUNTER — Ambulatory Visit (INDEPENDENT_AMBULATORY_CARE_PROVIDER_SITE_OTHER): Payer: Medicare Other | Admitting: Nurse Practitioner

## 2013-09-09 ENCOUNTER — Telehealth: Payer: Self-pay | Admitting: Family Medicine

## 2013-09-09 ENCOUNTER — Encounter: Payer: Self-pay | Admitting: Nurse Practitioner

## 2013-09-09 VITALS — BP 129/74 | HR 90 | Ht 66.0 in | Wt 142.0 lb

## 2013-09-09 DIAGNOSIS — I639 Cerebral infarction, unspecified: Secondary | ICD-10-CM

## 2013-09-09 DIAGNOSIS — I6529 Occlusion and stenosis of unspecified carotid artery: Secondary | ICD-10-CM

## 2013-09-09 DIAGNOSIS — I6350 Cerebral infarction due to unspecified occlusion or stenosis of unspecified cerebral artery: Secondary | ICD-10-CM

## 2013-09-09 DIAGNOSIS — I63239 Cerebral infarction due to unspecified occlusion or stenosis of unspecified carotid arteries: Secondary | ICD-10-CM

## 2013-09-09 DIAGNOSIS — G459 Transient cerebral ischemic attack, unspecified: Secondary | ICD-10-CM

## 2013-09-09 DIAGNOSIS — Z48812 Encounter for surgical aftercare following surgery on the circulatory system: Secondary | ICD-10-CM

## 2013-09-09 MED ORDER — CLOPIDOGREL BISULFATE 75 MG PO TABS: 75.0000 mg | ORAL_TABLET | Freq: Once | ORAL | Status: DC

## 2013-09-09 NOTE — Telephone Encounter (Signed)
[name] called and states that pt went to ER last night and they told her to take Plavix 75 instead of ASA 81 mg.  She wants to know what you think?  Please call [phone]

## 2013-09-09 NOTE — Telephone Encounter (Signed)
Tell her that I totally agree.

## 2013-09-09 NOTE — Patient Instructions (Signed)
Continue clopidogrel 75 mg orally every day  for secondary stroke prevention and maintain strict control of hypertension with blood pressure goal below 130/90, diabetes with hemoglobin A1c goal below 6.5% and lipids with LDL cholesterol goal below 70 mg/dL. Followup in the future with me in 6 months.  STROKE/TIA INSTRUCTIONS SMOKING Cigarette smoking nearly doubles your risk of having a stroke & is the single most alterable risk factor  If you smoke or have smoked in the last 12 months, you are advised to quit smoking for your health.  Most of the excess cardiovascular risk related to smoking disappears within a year of stopping.  Ask you doctor about anti-smoking medications  [place] Quit Line: 1-800-QUIT NOW  Free Smoking Cessation Classes ([phone]  CHOLESTEROL Know your levels; limit fat & cholesterol in your diet  Lab Results  Component Value Date   CHOL 156 06/28/2013   HDL 48 06/28/2013   LDLCALC 66 06/28/2013   TRIG 135 07/25/2013   CHOLHDL 3.3 06/28/2013      Many patients benefit from treatment even if their cholesterol is at goal.  Goal: Total Cholesterol less than 160  Goal:  LDL less than 100  Goal:  HDL greater than 40  Goal:  Triglycerides less than 150  BLOOD PRESSURE American Stroke Association blood pressure target is less that 120/80 mm/Hg  Your discharge blood pressure is:  BP: 129/74 mmHg  Monitor your blood pressure  Limit your salt and alcohol intake  Many individuals will require more than one medication for high blood pressure  DIABETES (A1c is a blood sugar average for last 3 months) Goal A1c is under 7% (A1c is blood sugar average for last 3 months)  Diabetes: Diagnosis of diabetes:  A1c was not drawn this admission    Lab Results  Component Value Date   HGBA1C 7.6 06/09/2013    Your A1c can be lowered with medications, healthy diet, and exercise.  Check your blood sugar as directed by your physician  Call your physician if you experience  unexplained or low blood sugars.  PHYSICAL ACTIVITY/REHABILITATION Goal is 30 minutes at least 4 days per week    Activity decreases your risk of heart attack and stroke and makes your heart stronger.  It helps control your weight and blood pressure; helps you relax and can improve your mood.  Participate in a regular exercise program.  Talk with your doctor about the best form of exercise for you (dancing, walking, swimming, cycling).  DIET/WEIGHT Goal is to maintain a healthy weight  Your height is:  Height: 5\' 6"  (167.6 cm) Your current weight is: Weight: 142 lb (64.411 kg) Your body Mass Index (BMI) is:  BMI (Calculated): 23  Following the type of diet specifically designed for you will help prevent another stroke.  Your goal Body Mass Index (BMI) is 19-24.  Healthy food habits can help reduce 3 risk factors for stroke:  High cholesterol, hypertension, and excess weight.

## 2013-09-09 NOTE — Telephone Encounter (Signed)
Left message for daughter to call me back

## 2013-09-09 NOTE — Telephone Encounter (Signed)
Pt's daughter called in to reschedule her mother's appointment from yesterday that was canceled due to weather.  Daughter stated that her mother went to the ER last night with stroke like symptoms and they were told that she was having TIAs on the left side.  Pt stated that the left side of her hand and face were tingling.  The first available appointment with Dr. Leonie Man is in June and they feel like she would need to be seen sooner than that.  Please call.  Thank you.

## 2013-09-09 NOTE — Telephone Encounter (Signed)
Called and spoke with lisa(daughter), scheduled/confirmed appt for today

## 2013-09-09 NOTE — Progress Notes (Signed)
PATIENT: JAN WALTERS DOB: Jul 13, 1941   REASON FOR VISIT: follow up for stroke HISTORY FROM: patient  HISTORY OF PRESENT ILLNESS: ZYAIRE DUMAS is a 73 y.o. female history hypertension, hyperlipidemia, diabetes mellitus and TIA in May 2014, presenting 06/27/2013 with new onset speech difficulty which was first noted when she woke up the morning of 06/26/2013. She was last known well at 9:30 PM on 06/25/2013. She's been taking aspirin daily but ran out of aspirin one week ago. CT scan of her head today showed no intracranial abnormality. MRI study showed small left posterior frontal ischemic cerebral infarction. MRA was negative. NIH stroke score was 1.  Cholesterol was 156, Trig 208, LDL 66, HgbA1c 7.6. 2D Echo with the estimated ejection fraction was in the range of 55% to 60%. No cardiac source of emboli was indentified. Carotid dopplers showed left side >80% stenosis, and she had left endarterectomy by Dr. Oneida Alar. She was hospitalized with sepsis from C diff Jan 2015. She recovered well without serious neurological deficits.  She has mild word-finding difficulties and dysarthria when tired.  Yesterday she had episode of numbness in her left hand which spread to the left side of her face, which resolved within a half-hour.  She was taken to ER by family, but discharged with diagnosis of TIA.  Her aspirin was changed to Plavix which she has not filled yet.  REVIEW OF SYSTEMS: Full 14 system review of systems performed and notable only for:  Appetite change, unexpected weight change, neck stiffness, cough, headache, numbness, speech difficulty.  ALLERGIES: Allergies  Allergen Reactions  . Codeine Nausea And Vomiting  . Lipitor [Atorvastatin] Nausea And Vomiting  . Phenergan [Promethazine Hcl] Other (See Comments)    Confusion, hallucinations, severe aggitation    HOME MEDICATIONS: Outpatient Prescriptions Prior to Visit  Medication Sig Dispense Refill  . aspirin EC 325 MG EC  tablet Take 1 tablet (325 mg total) by mouth daily.  30 tablet  0  . clopidogrel (PLAVIX) 75 MG tablet Take 1 tablet (75 mg total) by mouth 2 (two) times daily.  60 tablet  0  . lisinopril-hydrochlorothiazide (PRINZIDE,ZESTORETIC) 20-12.5 MG per tablet Take 1 tablet by mouth daily.      . rosuvastatin (CRESTOR) 20 MG tablet Take 1 tablet (20 mg total) by mouth daily.  30 tablet  11  . sitaGLIPtin-metformin (JANUMET) 50-1000 MG per tablet Take 1 tablet by mouth 2 (two) times daily with a meal.  60 tablet  5  . verapamil (CALAN-SR) 240 MG CR tablet Take 240 mg by mouth daily.      . PredniSONE 5 MG KIT Take 6 pills todsy,  5 the second, 4 the third, 3 fourth, 2 on the fifth and one on the last day  21 each  0   No facility-administered medications prior to visit.    PAST MEDICAL HISTORY: Past Medical History  Diagnosis Date  . Neuropathy, diabetic   . Dyslipidemia     takes Crestor daily  . Smoker   . Hypertension     takes Prinzide and Verapamil daily  . Diabetes mellitus     takes Janumet daily  . PONV (postoperative nausea and vomiting)   . Impaired speech     from stroke  . Vertigo     but doesn't take any meds  . Stroke 06/25/13    PAST SURGICAL HISTORY: Past Surgical History  Procedure Laterality Date  . Cholecystectomy    . Knee surgery Left 17yr ago  .  Cataract removed Right   . Endarterectomy Left 07/14/2013    Procedure: ENDARTERECTOMY CAROTID-LEFT;  Surgeon: Elam Dutch, MD;  Location: [facility];  Service: Vascular;  Laterality: Left;  . Patch angioplasty Left 07/14/2013    Procedure: LEFT CAROTID ARTERY PATCH ANGIOPLASTY;  Surgeon: Elam Dutch, MD;  Location: [facility];  Service: Vascular;  Laterality: Left;    FAMILY HISTORY: No family history on file.  SOCIAL HISTORY: History   Social History  . Marital Status: Married    Spouse Name: james    Number of Children: 3  . Years of Education: 10   Occupational History  . retired    Social History  Main Topics  . Smoking status: Former Smoker    Quit date: 06/27/2013  . Smokeless tobacco: Not on file     Comment: hasn't smoked since Sat,Dec 13  . Alcohol Use: No  . Drug Use: No  . Sexual Activity: Yes   Other Topics Concern  . Not on file   Social History Narrative  . No narrative on file     PHYSICAL EXAM  Filed Vitals:   09/09/13 1300  BP: 129/74  Pulse: 90  Height: '5\' 6"'  (1.676 m)  Weight: 142 lb (64.411 kg)   Body mass index is 22.93 kg/(m^2).  Generalized: Well developed, in no acute distress  Head: normocephalic and atraumatic. Oropharynx benign  Neck: Supple, no carotid bruits  Cardiac: Regular rate rhythm, no murmur  Musculoskeletal: No deformity   Neurological examination  Mentation: Alert oriented to time, place, history taking. Follows all commands speech and language fluent Cranial nerve II-XII:  Pupils were equal round reactive to light extraocular movements were full, visual field were full on confrontational test. Facial sensation and strength were normal. hearing was intact to finger rubbing bilaterally. Uvula tongue midline. head turning and shoulder shrug and were normal and symmetric.Tongue protrusion into cheek strength was normal. Motor: normal bulk and tone, full strength in the BUE, BLE, fine finger movements normal, no pronator drift. No focal weakness Sensory: normal and symmetric to light touch, pinprick, and  vibration  Coordination: finger-nose-finger, heel-to-shin bilaterally, no dysmetria Reflexes:  Deep tendon reflexes in the upper and lower extremities are present and symmetric.  Gait and Station: Rising up from seated position without assistance, normal stance, without trunk ataxia, moderate stride, good arm swing, smooth turning, able to perform tiptoe, and heel walking without difficulty. Romberg negative.  NIHSS: 0 Mod Rankin: 1  ASSESSMENT AND PLAN 73 y.o. year old female  has a past medical history of Neuropathy, diabetic;  Dyslipidemia; Smoker; Hypertension; Diabetes mellitus; PONV (postoperative nausea and vomiting);  Vertigo; left posterior frontal ischemic Stroke (06/25/13) Left Carotid Endarterectomy and TIA here for stroke follow up.  She has recovered well with only mild word-finding difficulties.  PLAN: Continue clopidogrel 75 mg orally every day  for secondary stroke prevention and maintain strict control of hypertension with blood pressure goal below 130/90, diabetes with hemoglobin A1c goal below 6.5% and lipids with LDL cholesterol goal below 100 mg/dL.  Followup in the future with me in 6 months, sooner as needed.  Philmore Pali, MSN, NP-C 09/09/2013, 1:34 PM [facility] [address] [place], [place] [address] [phone]  Note: This document was prepared with digital dictation and possible smart phrase technology. Any transcriptional errors that result from this process are unintentional.

## 2013-09-10 ENCOUNTER — Ambulatory Visit: Payer: Medicare Other | Admitting: Family Medicine

## 2013-09-13 NOTE — ED Provider Notes (Signed)
I saw and evaluated the patient, reviewed the resident's note and I agree with the findings and plan.   .Face to face Exam:  General:  Awake HEENT:  Atraumatic Resp:  Normal effort Abd:  Nondistended Neuro:No focal weakness Lymph: No adenopathy   Dot Lanes, MD 09/13/13 [phone]

## 2013-09-29 ENCOUNTER — Encounter: Payer: Self-pay | Admitting: Family Medicine

## 2013-09-29 ENCOUNTER — Ambulatory Visit (INDEPENDENT_AMBULATORY_CARE_PROVIDER_SITE_OTHER): Payer: Medicare Other | Admitting: Family Medicine

## 2013-09-29 VITALS — BP 130/82 | HR 80 | Wt 144.0 lb

## 2013-09-29 DIAGNOSIS — Z87891 Personal history of nicotine dependence: Secondary | ICD-10-CM

## 2013-09-29 DIAGNOSIS — E1169 Type 2 diabetes mellitus with other specified complication: Secondary | ICD-10-CM

## 2013-09-29 DIAGNOSIS — I10 Essential (primary) hypertension: Secondary | ICD-10-CM

## 2013-09-29 DIAGNOSIS — E1159 Type 2 diabetes mellitus with other circulatory complications: Secondary | ICD-10-CM

## 2013-09-29 DIAGNOSIS — E785 Hyperlipidemia, unspecified: Secondary | ICD-10-CM

## 2013-09-29 DIAGNOSIS — E1149 Type 2 diabetes mellitus with other diabetic neurological complication: Secondary | ICD-10-CM

## 2013-09-29 LAB — POCT GLYCOSYLATED HEMOGLOBIN (HGB A1C): Hemoglobin A1C: 7.8

## 2013-09-29 NOTE — Patient Instructions (Signed)
I want your blood sugar before you eat in the low 100 range and after you eat nothing above 180. Occasionally check your sugar 2 hours after a meal

## 2013-09-29 NOTE — Progress Notes (Signed)
   Subjective:    Patient ID: [name], female    DOB: 09-07-1940, 73 y.o.   MRN: [id number]  HPI She is here for followup on mainly her diabetes. She has had a stormy course the last several months. She did have a TIA and also became septic and had C. difficile colitis. At this point she is doing much better. She has quit smoking. She states her blood sugars run in the 150-180 range. She admits to drinking Coke regularly. She continues on medications listed in the chart. Her daughter does live with her and is helping care for her and feed her. She is not driving.   Review of Systems     Objective:   Physical Exam Alert and in no distress with a normal speech pattern. He will A1c 7.8.       Assessment & Plan:  Type II or unspecified type diabetes mellitus with neurological manifestations, not stated as uncontrolled - Plan: HgB A1c  Dyslipidemia  Former smoker  Hypertension associated with diabetes  I discussed the possibility of adding another medication however at this point she will work on making dietary changes. Recheck here in 4 months

## 2013-09-30 ENCOUNTER — Other Ambulatory Visit: Payer: Self-pay | Admitting: Family Medicine

## 2013-10-05 ENCOUNTER — Telehealth: Payer: Self-pay | Admitting: Internal Medicine

## 2013-10-05 DIAGNOSIS — E1140 Type 2 diabetes mellitus with diabetic neuropathy, unspecified: Secondary | ICD-10-CM

## 2013-10-05 MED ORDER — SITAGLIPTIN PHOS-METFORMIN HCL 50-1000 MG PO TABS: 1.0000 | ORAL_TABLET | Freq: Two times a day (BID) | ORAL | Status: DC

## 2013-10-05 NOTE — Telephone Encounter (Signed)
Medication sent in. 

## 2013-10-05 NOTE — Telephone Encounter (Signed)
Refil request for 90 day supply fo rjanumet 50-1000mg  #180 to [facility] [place]

## 2013-10-15 ENCOUNTER — Telehealth: Payer: Self-pay

## 2013-10-15 NOTE — Telephone Encounter (Signed)
Refill request for Accu-chk AVIVA PLUS Lancet Soft Clix

## 2013-10-15 NOTE — Telephone Encounter (Signed)
Renew these for a full year

## 2013-10-19 MED ORDER — GLUCOSE BLOOD VI STRP: ORAL_STRIP | Status: DC

## 2013-10-19 MED ORDER — ACCU-CHEK SOFTCLIX LANCET DEV MISC: Status: DC

## 2013-10-19 NOTE — Telephone Encounter (Signed)
Medication sent in. 

## 2013-12-30 ENCOUNTER — Other Ambulatory Visit: Payer: Self-pay | Admitting: Family Medicine

## 2014-02-01 ENCOUNTER — Ambulatory Visit: Payer: Medicare Other | Admitting: Family Medicine

## 2014-02-03 ENCOUNTER — Encounter: Payer: Self-pay | Admitting: Family

## 2014-02-04 ENCOUNTER — Encounter: Payer: Self-pay | Admitting: Family

## 2014-02-04 ENCOUNTER — Ambulatory Visit (HOSPITAL_COMMUNITY)
Admission: RE | Admit: 2014-02-04 | Discharge: 2014-02-04 | Disposition: A | Discharge status: Home / Self Care | Payer: Medicare Other | Source: Ambulatory Visit | Attending: Family | Admitting: Family

## 2014-02-04 ENCOUNTER — Ambulatory Visit (INDEPENDENT_AMBULATORY_CARE_PROVIDER_SITE_OTHER): Payer: Medicare Other | Admitting: Family

## 2014-02-04 VITALS — BP 147/76 | HR 75 | Resp 16 | Ht 66.5 in | Wt 151.3 lb

## 2014-02-04 DIAGNOSIS — I6529 Occlusion and stenosis of unspecified carotid artery: Secondary | ICD-10-CM | POA: Insufficient documentation

## 2014-02-04 DIAGNOSIS — Z48812 Encounter for surgical aftercare following surgery on the circulatory system: Secondary | ICD-10-CM | POA: Insufficient documentation

## 2014-02-04 NOTE — Patient Instructions (Addendum)
Stroke Prevention Some medical conditions and behaviors are associated with an increased chance of having a stroke. You may prevent a stroke by making healthy choices and managing medical conditions. HOW CAN I REDUCE MY RISK OF HAVING A STROKE?   Stay physically active. Get at least 30 minutes of activity on most or all days.  Do not smoke. It may also be helpful to avoid exposure to secondhand smoke.  Limit alcohol use. Moderate alcohol use is considered to be:  No more than 2 drinks per day for men.  No more than 1 drink per day for nonpregnant women.  Eat healthy foods. This involves:  Eating 5 or more servings of fruits and vegetables a day.  Making dietary changes that address high blood pressure (hypertension), high cholesterol, diabetes, or obesity.  Manage your cholesterol levels.  Making food choices that are high in fiber and low in saturated fat, trans fat, and cholesterol may control cholesterol levels.  Take any prescribed medicines to control cholesterol as directed by your health care provider.  Manage your diabetes.  Controlling your carbohydrate and sugar intake is recommended to manage diabetes.  Take any prescribed medicines to control diabetes as directed by your health care provider.  Control your hypertension.  Making food choices that are low in salt (sodium), saturated fat, trans fat, and cholesterol is recommended to manage hypertension.  Take any prescribed medicines to control hypertension as directed by your health care provider.  Maintain a healthy weight.  Reducing calorie intake and making food choices that are low in sodium, saturated fat, trans fat, and cholesterol are recommended to manage weight.  Stop drug abuse.  Avoid taking birth control pills.  Talk to your health care provider about the risks of taking birth control pills if you are over 35 years old, smoke, get migraines, or have ever had a blood clot.  Get evaluated for sleep  disorders (sleep apnea).  Talk to your health care provider about getting a sleep evaluation if you snore a lot or have excessive sleepiness.  Take medicines only as directed by your health care provider.  For some people, aspirin or blood thinners (anticoagulants) are helpful in reducing the risk of forming abnormal blood clots that can lead to stroke. If you have the irregular heart rhythm of atrial fibrillation, you should be on a blood thinner unless there is a good reason you cannot take them.  Understand all your medicine instructions.  Make sure that other conditions (such as anemia or atherosclerosis) are addressed. SEEK IMMEDIATE MEDICAL CARE IF:   You have sudden weakness or numbness of the face, arm, or leg, especially on one side of the body.  Your face or eyelid droops to one side.  You have sudden confusion.  You have trouble speaking (aphasia) or understanding.  You have sudden trouble seeing in one or both eyes.  You have sudden trouble walking.  You have dizziness.  You have a loss of balance or coordination.  You have a sudden, severe headache with no known cause.  You have new chest pain or an irregular heartbeat. Any of these symptoms may represent a serious problem that is an emergency. Do not wait to see if the symptoms will go away. Get medical help at once. Call your local emergency services (911 in [place] Do not drive yourself to the hospital. Document Released: 08/09/2004 Document Revised: 11/16/2013 Document Reviewed: 01/02/2013 ExitCare Patient Information 2015 [employer]. This information is not intended to replace advice given   to you by your health care provider. Make sure you discuss any questions you have with your health care provider.   Smoking Cessation Quitting smoking is important to your health and has many advantages. However, it is not always easy to quit since nicotine is a very addictive drug. Oftentimes, people try 3 times or more  before being able to quit. This document explains the best ways for you to prepare to quit smoking. Quitting takes hard work and a lot of effort, but you can do it. ADVANTAGES OF QUITTING SMOKING  You will live longer, feel better, and live better.  Your body will feel the impact of quitting smoking almost immediately.  Within 20 minutes, blood pressure decreases. Your pulse returns to its normal level.  After 8 hours, carbon monoxide levels in the blood return to normal. Your oxygen level increases.  After 24 hours, the chance of having a heart attack starts to decrease. Your breath, hair, and body stop smelling like smoke.  After 48 hours, damaged nerve endings begin to recover. Your sense of taste and smell improve.  After 72 hours, the body is virtually free of nicotine. Your bronchial tubes relax and breathing becomes easier.  After 2 to 12 weeks, lungs can hold more air. Exercise becomes easier and circulation improves.  The risk of having a heart attack, stroke, cancer, or lung disease is greatly reduced.  After 1 year, the risk of coronary heart disease is cut in half.  After 5 years, the risk of stroke falls to the same as a nonsmoker.  After 10 years, the risk of lung cancer is cut in half and the risk of other cancers decreases significantly.  After 15 years, the risk of coronary heart disease drops, usually to the level of a nonsmoker.  If you are pregnant, quitting smoking will improve your chances of having a healthy baby.  The people you live with, especially any children, will be healthier.  You will have extra money to spend on things other than cigarettes. QUESTIONS TO THINK ABOUT BEFORE ATTEMPTING TO QUIT You may want to talk about your answers with your health care provider.  Why do you want to quit?  If you tried to quit in the past, what helped and what did not?  What will be the most difficult situations for you after you quit? How will you plan to  handle them?  Who can help you through the tough times? Your family? Friends? A health care provider?  What pleasures do you get from smoking? What ways can you still get pleasure if you quit? Here are some questions to ask your health care provider:  How can you help me to be successful at quitting?  What medicine do you think would be best for me and how should I take it?  What should I do if I need more help?  What is smoking withdrawal like? How can I get information on withdrawal? GET READY  Set a quit date.  Change your environment by getting rid of all cigarettes, ashtrays, matches, and lighters in your home, car, or work. Do not let people smoke in your home.  Review your past attempts to quit. Think about what worked and what did not. GET SUPPORT AND ENCOURAGEMENT You have a better chance of being successful if you have help. You can get support in many ways.  Tell your family, friends, and coworkers that you are going to quit and need their support. Ask them not   to smoke around you.  Get individual, group, or telephone counseling and support. Programs are available at local hospitals and health centers. Call your local health department for information about programs in your area.  Spiritual beliefs and practices may help some smokers quit.  Download a "quit meter" on your computer to keep track of quit statistics, such as how long you have gone without smoking, cigarettes not smoked, and money saved.  Get a self-help book about quitting smoking and staying off tobacco. LEARN NEW SKILLS AND BEHAVIORS  Distract yourself from urges to smoke. Talk to someone, go for a walk, or occupy your time with a task.  Change your normal routine. Take a different route to work. Drink tea instead of coffee. Eat breakfast in a different place.  Reduce your stress. Take a hot bath, exercise, or read a book.  Plan something enjoyable to do every day. Reward yourself for not  smoking.  Explore interactive web-based programs that specialize in helping you quit. GET MEDICINE AND USE IT CORRECTLY Medicines can help you stop smoking and decrease the urge to smoke. Combining medicine with the above behavioral methods and support can greatly increase your chances of successfully quitting smoking.  Nicotine replacement therapy helps deliver nicotine to your body without the negative effects and risks of smoking. Nicotine replacement therapy includes nicotine gum, lozenges, inhalers, nasal sprays, and skin patches. Some may be available over-the-counter and others require a prescription.  Antidepressant medicine helps people abstain from smoking, but how this works is unknown. This medicine is available by prescription.  Nicotinic receptor partial agonist medicine simulates the effect of nicotine in your brain. This medicine is available by prescription. Ask your health care provider for advice about which medicines to use and how to use them based on your health history. Your health care provider will tell you what side effects to look out for if you choose to be on a medicine or therapy. Carefully read the information on the package. Do not use any other product containing nicotine while using a nicotine replacement product.  RELAPSE OR DIFFICULT SITUATIONS Most relapses occur within the first 3 months after quitting. Do not be discouraged if you start smoking again. Remember, most people try several times before finally quitting. You may have symptoms of withdrawal because your body is used to nicotine. You may crave cigarettes, be irritable, feel very hungry, cough often, get headaches, or have difficulty concentrating. The withdrawal symptoms are only temporary. They are strongest when you first quit, but they will go away within 10-14 days. To reduce the chances of relapse, try to:  Avoid drinking alcohol. Drinking lowers your chances of successfully quitting.  Reduce the  amount of caffeine you consume. Once you quit smoking, the amount of caffeine in your body increases and can give you symptoms, such as a rapid heartbeat, sweating, and anxiety.  Avoid smokers because they can make you want to smoke.  Do not let weight gain distract you. Many smokers will gain weight when they quit, usually less than 10 pounds. Eat a healthy diet and stay active. You can always lose the weight gained after you quit.  Find ways to improve your mood other than smoking. FOR MORE INFORMATION  www.smokefree.gov  Document Released: 06/26/2001 Document Revised: 11/16/2013 Document Reviewed: 10/11/2011 ExitCare Patient Information 2015 [employer]. This information is not intended to replace advice given to you by your health care provider. Make sure you discuss any questions you have with your health care   provider.  

## 2014-02-04 NOTE — Progress Notes (Signed)
Established Carotid Patient   History of Present Illness  [name] is a 73 y.o. female patient of Dr. Oneida Alar who is s/p left CEA with Dacron patch on 07/04/13 for symptomatic left internal carotid stenosis with exophytic calcified plaque. She returns today for follow up.  Patient has Positive history of TIA (Feb., 2015), or stroke (Dec.,2014) symptom as manifested by right hand and right facial tingling and numbness, as manifested by TIA symtoms later and expressive aphasia and confusion , denies weakness on right side, denies monocular vision loss, denies unilateral facial drooping.   The patient's previous neurologic deficits are resolved. Pt denies cllauduaction symptoms with walking, denies non healing wounds, denies any known cardiac problems. Pt  reports New Medical or Surgical History: had sepsis from c.diff, January, 2015  Pt Diabetic: Yes, A1C in March, 2015 was 7.8, uncontrolled Pt smoker: smoker  (1/2 ppd, started at age 73 yrs)  Pt meds include: Statin : Yes ASA: No Other anticoagulants/antiplatelets: Plavix, started Feb., 2015 when she had the TIA, prescribed by Dr. Leonie Man   Past Medical History  Diagnosis Date  . Neuropathy, diabetic   . Dyslipidemia     takes Crestor daily  . Smoker   . Hypertension     takes Prinzide and Verapamil daily  . Diabetes mellitus     takes Janumet daily  . PONV (postoperative nausea and vomiting)   . Impaired speech     from stroke  . Vertigo     but doesn't take any meds  . Stroke 06/25/13    Social History History  Substance Use Topics  . Smoking status: Former Smoker    Quit date: 06/27/2013  . Smokeless tobacco: Not on file     Comment: hasn't smoked since Sat,Dec 13  . Alcohol Use: No    Family History No family history on file.  Surgical History Past Surgical History  Procedure Laterality Date  . Cholecystectomy    . Knee surgery Left 24yrs ago  . Cataract removed Right   . Endarterectomy Left  07/14/2013    Procedure: ENDARTERECTOMY CAROTID-LEFT;  Surgeon: Elam Dutch, MD;  Location: [facility];  Service: Vascular;  Laterality: Left;  . Patch angioplasty Left 07/14/2013    Procedure: LEFT CAROTID ARTERY PATCH ANGIOPLASTY;  Surgeon: Elam Dutch, MD;  Location: [facility];  Service: Vascular;  Laterality: Left;    Allergies  Allergen Reactions  . Codeine Nausea And Vomiting  . Lipitor [Atorvastatin] Nausea And Vomiting  . Phenergan [Promethazine Hcl] Other (See Comments)    Confusion, hallucinations, severe aggitation    Current Outpatient Prescriptions  Medication Sig Dispense Refill  . clopidogrel (PLAVIX) 75 MG tablet Take 1 tablet (75 mg total) by mouth once.  30 tablet  5  . glucose blood (ACCU-CHEK AVIVA) test strip Test sugar once daily  300 each  3  . Lancet Devices (ACCU-CHEK SOFTCLIX) lancets Test blood sugar once daily  300 each  3  . lisinopril-hydrochlorothiazide (PRINZIDE,ZESTORETIC) 20-12.5 MG per tablet Take 1 tablet by mouth daily.      Marland Kitchen lisinopril-hydrochlorothiazide (PRINZIDE,ZESTORETIC) 20-12.5 MG per tablet take 1 tablet by mouth once daily  30 tablet  2  . rosuvastatin (CRESTOR) 20 MG tablet Take 1 tablet (20 mg total) by mouth daily.  30 tablet  11  . sitaGLIPtin-metformin (JANUMET) 50-1000 MG per tablet Take 1 tablet by mouth 2 (two) times daily with a meal.  60 tablet  5  . verapamil (CALAN-SR) 240 MG CR tablet Take  240 mg by mouth daily.      . verapamil (CALAN-SR) 240 MG CR tablet take 1 tablet by mouth once daily  60 tablet  6   No current facility-administered medications for this visit.    Review of Systems : See HPI for pertinent positives and negatives.  Physical Examination  Filed Vitals:   02/04/14 1035 02/04/14 1038  BP: 155/75 147/76  Pulse: 73 75  Resp:  16  Height:  5' 6.5" (1.689 m)  Weight:  151 lb 4.8 oz (68.629 kg)  SpO2:  98%   Body mass index is 24.06 kg/(m^2).  General: WDWN female in NAD GAIT: normal Eyes:  PERRLA Pulmonary:  Non-labored, CTAB, Negative  Rales, Negative rhonchi, & Negative wheezing.  Cardiac: regular Rhythm ,  Positive detected low or mid grade murmur.  VASCULAR EXAM Carotid Bruits Left Right   Positive, mild Positive, mild    Radial pulses are 1+ palpable and equal.                                                                                                                            LE Pulses LEFT RIGHT       POPLITEAL  1+ palpable   1+ palpable       POSTERIOR TIBIAL  2+ palpable   2+ palpable        DORSALIS PEDIS      ANTERIOR TIBIAL not palpable  not palpable     Gastrointestinal: soft, nontender, BS WNL, no r/g,  negative masses.  Musculoskeletal: Negative muscle atrophy/wasting. M/S 5/5 throughout, Extremities without ischemic changes.  Neurologic: A&O X 3; Appropriate Affect ; SENSATION ;normal;  Speech is normal CN 2-12 intact, Pain and light touch intact in extremities, Motor exam as listed above.   Non-Invasive Vascular Imaging CAROTID DUPLEX 02/04/2014   CEREBROVASCULAR DUPLEX EVALUATION    INDICATION: Carotid artery disease     PREVIOUS INTERVENTION(S): Left carotid endarterectomy 07/14/2013.    DUPLEX EXAM:     RIGHT  LEFT  Peak Systolic Velocities (cm/s) End Diastolic Velocities (cm/s) Plaque LOCATION Peak Systolic Velocities (cm/s) End Diastolic Velocities (cm/s) Plaque  81 10  CCA PROXIMAL 78 16   66 13  CCA MID 78 16   74 15  CCA DISTAL 66 14   86 11  ECA 135 24   74 16 HT ICA PROXIMAL 34 3   70 18  ICA MID 86 24   92 21  ICA DISTAL 70 19     1.39 ICA / CCA Ratio (PSV)   Antegrade  Vertebral Flow Antegrade   384 Brachial Systolic Pressure (mmHg) 665  Triphasic  Brachial Artery Waveforms Triphasic     Plaque Morphology:  HM = Homogeneous, HT = Heterogeneous, CP = Calcific Plaque, SP = Smooth Plaque, IP = Irregular Plaque     ADDITIONAL FINDINGS:     IMPRESSION: Right internal carotid artery velocities suggest a <40%  stenosis.  Patent left carotid  endarterectomy site with no evidence of hyperplasia or restenosis.   Compared to the previous exam:  No prior exam performed at this facility for comparison.       Assessment: [name] is a 73 y.o. female who is s/p left CEA with Dacron patch on 07/04/13 for symptomatic left internal carotid stenosis (>80%) with exophytic calcified plaque. Today's carotid Duplex result demonstrates  <40% stenosis in the right internal carotid artery and patent left carotid endarterectomy site with no evidence of hyperplasia or restenosis. No prior exam performed at this facility for comparison.   Plan: Follow-up in 6 months with Carotid Duplex scan.   I discussed in depth with the patient the nature of atherosclerosis, and emphasized the importance of maximal medical management including strict control of blood pressure, blood glucose, and lipid levels, obtaining regular exercise, and cessation of smoking.  The patient is aware that without maximal medical management the underlying atherosclerotic disease process will progress, limiting the benefit of any interventions. The patient was given information about stroke prevention and what symptoms should prompt the patient to seek immediate medical care. Thank you for allowing [place] to participate in this patient's care.  Clemon Chambers, RN, MSN, FNP-C [facility] Office: [facility] Physician: Oneida Alar  02/04/2014 10:04 AM

## 2014-02-04 NOTE — Addendum Note (Signed)
Addended by: Mena Goes on: 02/04/2014 01:55 PM   Modules accepted: Orders

## 2014-02-10 ENCOUNTER — Other Ambulatory Visit: Payer: Self-pay

## 2014-02-10 ENCOUNTER — Ambulatory Visit (INDEPENDENT_AMBULATORY_CARE_PROVIDER_SITE_OTHER): Payer: Medicare Other | Admitting: Family Medicine

## 2014-02-10 ENCOUNTER — Encounter: Payer: Self-pay | Admitting: Family Medicine

## 2014-02-10 VITALS — BP 140/80 | HR 82 | Wt 150.0 lb

## 2014-02-10 DIAGNOSIS — E785 Hyperlipidemia, unspecified: Secondary | ICD-10-CM

## 2014-02-10 DIAGNOSIS — I639 Cerebral infarction, unspecified: Secondary | ICD-10-CM

## 2014-02-10 DIAGNOSIS — I152 Hypertension secondary to endocrine disorders: Secondary | ICD-10-CM

## 2014-02-10 DIAGNOSIS — I10 Essential (primary) hypertension: Secondary | ICD-10-CM

## 2014-02-10 DIAGNOSIS — E1169 Type 2 diabetes mellitus with other specified complication: Secondary | ICD-10-CM

## 2014-02-10 DIAGNOSIS — I6350 Cerebral infarction due to unspecified occlusion or stenosis of unspecified cerebral artery: Secondary | ICD-10-CM

## 2014-02-10 DIAGNOSIS — Z1239 Encounter for other screening for malignant neoplasm of breast: Secondary | ICD-10-CM

## 2014-02-10 DIAGNOSIS — Z23 Encounter for immunization: Secondary | ICD-10-CM

## 2014-02-10 DIAGNOSIS — Z87891 Personal history of nicotine dependence: Secondary | ICD-10-CM

## 2014-02-10 DIAGNOSIS — E1159 Type 2 diabetes mellitus with other circulatory complications: Secondary | ICD-10-CM

## 2014-02-10 DIAGNOSIS — E1149 Type 2 diabetes mellitus with other diabetic neurological complication: Secondary | ICD-10-CM

## 2014-02-10 LAB — POCT GLYCOSYLATED HEMOGLOBIN (HGB A1C): Hemoglobin A1C: 7

## 2014-02-10 MED ORDER — ROSUVASTATIN CALCIUM 20 MG PO TABS
20.0000 mg | ORAL_TABLET | Freq: Every day | ORAL | Status: DC
Start: 2014-02-10 — End: 2015-01-21

## 2014-02-10 NOTE — Progress Notes (Signed)
Patient was given appointments for [facility] care Aug 6th at 3 pm/ mammo. Is at [facility] Aug. 5 15 at 10;45 patient is aware of both appointments

## 2014-02-10 NOTE — Telephone Encounter (Signed)
SENT IN CRESTOR PER PT REQUEST

## 2014-02-10 NOTE — Progress Notes (Signed)
Subjective:    [name] is a 73 y.o. female who presents for follow-up of Type 2 diabetes mellitus.  She has a previous history of difficulty with a CVA. She has cut down on her tobacco use but still isn't smoking. He is on medications listed in the chart. She has not had an eye exam in over a year. She has had an ABI she does smoke but does slowly cutting down.  Home blood sugar records: 150 TO 170  Current symptoms/problems include none Daily foot checks:    Any foot concerns:  none Last eye exam:  [place] 2013   Medication compliance: Good Current diet: none Current exercise: none Known diabetic complications: cerebrovascular disease Cardiovascular risk factors: advanced age (older than 16 for men, 18 for women), diabetes mellitus, dyslipidemia, hypertension and smoking/ tobacco exposure   The following portions of the patient's history were reviewed and updated as appropriate: allergies, current medications, past family history, past medical history, past social history, past surgical history and problem list.  ROS as in subjective above    Objective:    BP 140/80  Pulse 82  Wt 150 lb (68.04 kg)  Filed Vitals:   02/10/14 0937  BP: 140/80  Pulse: 82    General appearence: alert, no distress, WD/WN  Lab Review Lab Results  Component Value Date   HGBA1C 7.8 09/29/2013   Lab Results  Component Value Date   CHOL 156 06/28/2013   HDL 48 06/28/2013   LDLCALC 66 06/28/2013   TRIG 135 07/25/2013   CHOLHDL 3.3 06/28/2013   No results found for this basename: Derl Barrow     Chemistry      Component Value Date/Time   NA 140 09/08/2013 1835   K 4.1 09/08/2013 1835   CL 98 09/08/2013 1835   CO2 23 09/08/2013 1835   BUN 20 09/08/2013 1835   CREATININE 0.76 09/08/2013 1835   CREATININE 0.81 08/06/2013 1203      Component Value Date/Time   CALCIUM 9.5 09/08/2013 1835   ALKPHOS 88 09/08/2013 1835   AST 11 09/08/2013 1835   ALT 10 09/08/2013 1835   BILITOT 0.3  09/08/2013 1835        Chemistry      Component Value Date/Time   NA 140 09/08/2013 1835   K 4.1 09/08/2013 1835   CL 98 09/08/2013 1835   CO2 23 09/08/2013 1835   BUN 20 09/08/2013 1835   CREATININE 0.76 09/08/2013 1835   CREATININE 0.81 08/06/2013 1203      Component Value Date/Time   CALCIUM 9.5 09/08/2013 1835   ALKPHOS 88 09/08/2013 1835   AST 11 09/08/2013 1835   ALT 10 09/08/2013 1835   BILITOT 0.3 09/08/2013 1835       Hemoglobin A1c 7.0   Assessment:   Encounter Diagnoses  Name Primary?  . Need for prophylactic vaccination against Streptococcus pneumoniae (pneumococcus)   . Screening breast examination   . Type II or unspecified type diabetes mellitus with neurological manifestations, not stated as uncontrolled Yes  . Stroke   . Hypertension associated with diabetes   . Former smoker   . DM type 2 with diabetic dyslipidemia          Plan:    1.  Rx changes: none 2.  Education: Reviewed 'ABCs' of diabetes management (respective goals in parentheses):  A1C (<7), blood pressure (<130/80), and cholesterol (LDL <100). 3.  Compliance at present is estimated to be good. Efforts to improve compliance (  if necessary) will be directed at No change. 4. Follow up: 4 months  Colonoscopy was discussed with the patient and she refused. Pneumovax shot given.

## 2014-03-02 ENCOUNTER — Other Ambulatory Visit: Payer: Self-pay | Admitting: Nurse Practitioner

## 2014-03-08 ENCOUNTER — Other Ambulatory Visit: Payer: Self-pay | Admitting: Nurse Practitioner

## 2014-03-08 ENCOUNTER — Ambulatory Visit: Payer: Self-pay | Admitting: Nurse Practitioner

## 2014-03-09 ENCOUNTER — Ambulatory Visit: Payer: Medicare Other | Admitting: Nurse Practitioner

## 2014-03-15 ENCOUNTER — Ambulatory Visit: Payer: Medicare Other | Admitting: Nurse Practitioner

## 2014-03-16 ENCOUNTER — Ambulatory Visit: Payer: Self-pay | Admitting: Nurse Practitioner

## 2014-03-18 ENCOUNTER — Encounter: Payer: Self-pay | Admitting: Nurse Practitioner

## 2014-03-18 ENCOUNTER — Ambulatory Visit (INDEPENDENT_AMBULATORY_CARE_PROVIDER_SITE_OTHER): Payer: Medicare Other | Admitting: Nurse Practitioner

## 2014-03-18 VITALS — BP 129/72 | HR 78 | Ht 65.5 in | Wt 152.0 lb

## 2014-03-18 DIAGNOSIS — Z9889 Other specified postprocedural states: Secondary | ICD-10-CM

## 2014-03-18 DIAGNOSIS — I6529 Occlusion and stenosis of unspecified carotid artery: Secondary | ICD-10-CM

## 2014-03-18 NOTE — Patient Instructions (Signed)
Continue clopidogrel 75 mg orally every day for secondary stroke prevention and maintain strict control of hypertension with blood pressure goal below 130/90, diabetes with hemoglobin A1c goal below 6.5% and lipids with LDL cholesterol goal below 100 mg/dL.  Followup in the future with Dr. Erlinda Hong in 6 months, sooner as needed.  Stroke Prevention Some medical conditions and behaviors are associated with an increased chance of having a stroke. You may prevent a stroke by making healthy choices and managing medical conditions. HOW CAN I REDUCE MY RISK OF HAVING A STROKE?   Stay physically active. Get at least 30 minutes of activity on most or all days.  Do not smoke. It may also be helpful to avoid exposure to secondhand smoke.  Limit alcohol use. Moderate alcohol use is considered to be:  No more than 2 drinks per day for men.  No more than 1 drink per day for nonpregnant women.  Eat healthy foods. This involves:  Eating 5 or more servings of fruits and vegetables a day.  Making dietary changes that address high blood pressure (hypertension), high cholesterol, diabetes, or obesity.  Manage your cholesterol levels.  Making food choices that are high in fiber and low in saturated fat, trans fat, and cholesterol may control cholesterol levels.  Take any prescribed medicines to control cholesterol as directed by your health care provider.  Manage your diabetes.  Controlling your carbohydrate and sugar intake is recommended to manage diabetes.  Take any prescribed medicines to control diabetes as directed by your health care provider.  Control your hypertension.  Making food choices that are low in salt (sodium), saturated fat, trans fat, and cholesterol is recommended to manage hypertension.  Take any prescribed medicines to control hypertension as directed by your health care provider.  Maintain a healthy weight.  Reducing calorie intake and making food choices that are low in  sodium, saturated fat, trans fat, and cholesterol are recommended to manage weight.  Stop drug abuse.  Avoid taking birth control pills.  Talk to your health care provider about the risks of taking birth control pills if you are over 56 years old, smoke, get migraines, or have ever had a blood clot.  Get evaluated for sleep disorders (sleep apnea).  Talk to your health care provider about getting a sleep evaluation if you snore a lot or have excessive sleepiness.  Take medicines only as directed by your health care provider.  For some people, aspirin or blood thinners (anticoagulants) are helpful in reducing the risk of forming abnormal blood clots that can lead to stroke. If you have the irregular heart rhythm of atrial fibrillation, you should be on a blood thinner unless there is a good reason you cannot take them.  Understand all your medicine instructions.  Make sure that other conditions (such as anemia or atherosclerosis) are addressed. SEEK IMMEDIATE MEDICAL CARE IF:   You have sudden weakness or numbness of the face, arm, or leg, especially on one side of the body.  Your face or eyelid droops to one side.  You have sudden confusion.  You have trouble speaking (aphasia) or understanding.  You have sudden trouble seeing in one or both eyes.  You have sudden trouble walking.  You have dizziness.  You have a loss of balance or coordination.  You have a sudden, severe headache with no known cause.  You have new chest pain or an irregular heartbeat. Any of these symptoms may represent a serious problem that is an emergency. Do  not wait to see if the symptoms will go away. Get medical help at once. Call your local emergency services (911 in [place] Do not drive yourself to the hospital. Document Released: 08/09/2004 Document Revised: 11/16/2013 Document Reviewed: 01/02/2013 [facility] Patient Information 2015 [place], [place]. This information is not intended to replace advice  given to you by your health care provider. Make sure you discuss any questions you have with your health care provider.

## 2014-03-18 NOTE — Progress Notes (Signed)
PATIENT: [name] DOB: 1940-09-15  REASON FOR VISIT: routine follow up for stroke HISTORY FROM: patient  HISTORY OF PRESENT ILLNESS: [name] is a 73 y.o. female who comes to the office for first hospital follow up post hospital discharge for stroke. She has a history of hypertension, hyperlipidemia, diabetes mellitus and TIA in May 2014, presenting 06/27/2013 with new onset speech difficulty which was first noted when she woke up the morning of 06/26/2013. She was last known well at 9:30 PM on 06/25/2013. She's been taking aspirin daily but ran out of aspirin one week ago. CT scan of her head today showed no intracranial abnormality. MRI study showed small left posterior frontal ischemic cerebral infarction. MRA was negative. NIH stroke score was 1. Cholesterol was 156, Trig 208, LDL 66, HgbA1c 7.6. 2D Echo with the estimated ejection fraction was in the range of 55% to 60%. No cardiac source of emboli was indentified. Carotid dopplers showed left side >80% stenosis, and she had left endarterectomy by Dr. Oneida Alar.  She was hospitalized with sepsis from C diff Jan 2015. She recovered well without serious neurological deficits. She has mild word-finding difficulties and dysarthria when tired. Yesterday she had episode of numbness in her left hand which spread to the left side of her face, which resolved within a half-hour. She was taken to ER by family, but discharged with diagnosis of TIA. Her aspirin was changed to Plavix which she has not filled yet.   Update 03/18/14 (LL):  [name] comes to the office with her daughter for 6 month stroke followup.  She states she is doing well and feels like she has fully recovered from stroke. Her blood pressure is well controlled, it is 129/72 in the office today. Her last Hgb a1c was 7.0, down from 7.8 previously.  She has no recurrent symptoms of numbness, weakness or speech difficulties. She is tolerating Plavix well with no signs of significant  bleeding or bruising. She has no new complaints today.  REVIEW OF SYSTEMS: Full 14 system review of systems performed and notable only for: Neck pain, neck stiffness, murmur,  Bruise easily, back pain, muscle cramps.  ALLERGIES: Allergies  Allergen Reactions  . Codeine Nausea And Vomiting  . Lipitor [Atorvastatin] Nausea And Vomiting  . Phenergan [Promethazine Hcl] Other (See Comments)    Confusion, hallucinations, severe aggitation    HOME MEDICATIONS: Outpatient Prescriptions Prior to Visit  Medication Sig Dispense Refill  . clopidogrel (PLAVIX) 75 MG tablet take 1 tablet by mouth once daily  30 tablet  0  . glucose blood (ACCU-CHEK AVIVA) test strip Test sugar once daily  300 each  3  . Lancet Devices (ACCU-CHEK SOFTCLIX) lancets Test blood sugar once daily  300 each  3  . lisinopril-hydrochlorothiazide (PRINZIDE,ZESTORETIC) 20-12.5 MG per tablet take 1 tablet by mouth once daily  30 tablet  2  . rosuvastatin (CRESTOR) 20 MG tablet Take 1 tablet (20 mg total) by mouth daily.  30 tablet  11  . sitaGLIPtin-metformin (JANUMET) 50-1000 MG per tablet Take 1 tablet by mouth 2 (two) times daily with a meal.  60 tablet  5  . verapamil (CALAN-SR) 240 MG CR tablet take 1 tablet by mouth once daily  60 tablet  6  . clopidogrel (PLAVIX) 75 MG tablet take 1 tablet by mouth once daily  30 tablet  0   No facility-administered medications prior to visit.    PHYSICAL EXAM Filed Vitals:   03/18/14 1054  BP:  129/72  Pulse: 78  Height: 5' 5.5" (1.664 m)  Weight: 152 lb (68.947 kg)   Body mass index is 24.9 kg/(m^2).  Generalized: Well developed, in no acute distress  Head: normocephalic and atraumatic. Oropharynx benign  Neck: Supple, no carotid bruits  Cardiac: Regular rate rhythm, no murmur  Musculoskeletal: No deformity   Neurological examination  Mentation: Alert oriented to time, place, history taking. Follows all commands speech and language fluent  Cranial nerve II-XII: Pupils  were equal round reactive to light extraocular movements were full, visual field were full on confrontational test. Facial sensation and strength were normal. hearing was intact to finger rubbing bilaterally. Uvula tongue midline. head turning and shoulder shrug and were normal and symmetric.Tongue protrusion into cheek strength was normal.  Motor: normal bulk and tone, full strength in the BUE, BLE, fine finger movements normal, no pronator drift. No focal weakness  Sensory: normal and symmetric to light touch, pinprick, and vibration  Coordination: finger-nose-finger, heel-to-shin bilaterally, no dysmetria  Reflexes: Deep tendon reflexes in the upper and lower extremities are present and symmetric.  Gait and Station: Rising up from seated position without assistance, normal stance, without trunk ataxia, moderate stride, good arm swing, smooth turning, able to perform tiptoe, and heel walking without difficulty. Romberg negative.  NIHSS: 0  Mod Rankin: 1   ASSESSMENT: 73 y.o. year old Caucasian female with a past medical history of Neuropathy, diabetic; Dyslipidemia; Smoker; Hypertension; Diabetes mellitus; PONV (postoperative nausea and vomiting); Vertigo; left posterior frontal ischemic Stroke (06/25/13) Left Carotid Endarterectomy and TIA here for stroke follow up. She has recovered well with only occasional mild word-finding difficulties.   PLAN:  Continue clopidogrel 75 mg orally every day for secondary stroke prevention and maintain strict control of hypertension with blood pressure goal below 130/90, diabetes with hemoglobin A1c goal below 6.5% and lipids with LDL cholesterol goal below 100 mg/dL.  Followup in the future with Dr. Erlinda Hong in 6 months, sooner as needed.  Rudi Rummage Luree Palla, MSN, FNP-BC, A/GNP-C 03/18/2014, 1:56 PM [facility] [address] [place],  [address] [phone]  Note: This document was prepared with digital dictation and possible smart  phrase technology. Any transcriptional errors that result from this process are unintentional.

## 2014-03-21 NOTE — Progress Notes (Signed)
I agree with the above plan 

## 2014-04-03 ENCOUNTER — Other Ambulatory Visit: Payer: Self-pay | Admitting: Family Medicine

## 2014-04-15 LAB — HM DIABETES EYE EXAM

## 2014-04-20 ENCOUNTER — Encounter: Payer: Self-pay | Admitting: Internal Medicine

## 2014-05-06 ENCOUNTER — Other Ambulatory Visit: Payer: Self-pay | Admitting: Nurse Practitioner

## 2014-06-15 ENCOUNTER — Ambulatory Visit: Payer: Medicare Other | Admitting: Family Medicine

## 2014-06-29 ENCOUNTER — Ambulatory Visit (INDEPENDENT_AMBULATORY_CARE_PROVIDER_SITE_OTHER): Payer: Medicare Other | Admitting: Family Medicine

## 2014-06-29 ENCOUNTER — Encounter: Payer: Self-pay | Admitting: Family Medicine

## 2014-06-29 VITALS — BP 120/84 | HR 70 | Wt 154.0 lb

## 2014-06-29 DIAGNOSIS — Z23 Encounter for immunization: Secondary | ICD-10-CM

## 2014-06-29 DIAGNOSIS — I10 Essential (primary) hypertension: Secondary | ICD-10-CM

## 2014-06-29 DIAGNOSIS — I639 Cerebral infarction, unspecified: Secondary | ICD-10-CM

## 2014-06-29 DIAGNOSIS — E1169 Type 2 diabetes mellitus with other specified complication: Secondary | ICD-10-CM

## 2014-06-29 DIAGNOSIS — E1159 Type 2 diabetes mellitus with other circulatory complications: Secondary | ICD-10-CM

## 2014-06-29 DIAGNOSIS — Z720 Tobacco use: Secondary | ICD-10-CM

## 2014-06-29 DIAGNOSIS — E785 Hyperlipidemia, unspecified: Secondary | ICD-10-CM

## 2014-06-29 DIAGNOSIS — E119 Type 2 diabetes mellitus without complications: Secondary | ICD-10-CM

## 2014-06-29 DIAGNOSIS — I152 Hypertension secondary to endocrine disorders: Secondary | ICD-10-CM

## 2014-06-29 DIAGNOSIS — F17210 Nicotine dependence, cigarettes, uncomplicated: Secondary | ICD-10-CM

## 2014-06-29 LAB — POCT GLYCOSYLATED HEMOGLOBIN (HGB A1C): Hemoglobin A1C: 7.2

## 2014-06-29 NOTE — Progress Notes (Signed)
Subjective:    [name] is a 73 y.o. female who presents for follow-up of Type 2 diabetes mellitus.    Home blood sugar records: Patient test BID and the past several days her B/S has been in the 200's in the am  Current symptoms/prob  NONE               She has started smoking again but says it is only 45 cigarettes. His had recent difficulty with diarrhea but does state she is doing much better but still having difficulty with malaise and slight anorexia. Daily foot checks: Any foot concerns: NONE Last eye exam:  04/15/14   Medication compliance:good  Current diet: NONE Current exercise: WALKING Known diabetic complications: cerebrovascular disease Cardiovascular risk factors: advanced age (older than 42 for men, 69 for women), diabetes mellitus, dyslipidemia, hypertension and smoking/ tobacco exposure     ROS as in subjective above    Objective:    BP 120/84 mmHg  Pulse 70  Wt 154 lb (69.854 kg)  SpO2 98%  Filed Vitals:   06/29/14 0910  BP: 120/84  Pulse: 70    General appearence: alert, no distress, WD/WN Neck: supple, no lymphadenopathy, no thyromegaly, no masses Heart: RRR, normal S1, S2, no murmurs Lungs: CTA bilaterally, no wheezes, rhonchi, or rales Abdomen: +bs, soft, non tender, non distended, no masses, no hepatomegaly, no splenomegaly Pulses: 2+ symmetric, upper and lower extremities, normal cap refill Ext: no edema Foot exam:  Neuro: foot monofilament exam normal   Lab Review Lab Results  Component Value Date   HGBA1C 7.0 02/10/2014   Lab Results  Component Value Date   CHOL 156 06/28/2013   HDL 48 06/28/2013   LDLCALC 66 06/28/2013   TRIG 135 07/25/2013   CHOLHDL 3.3 06/28/2013   No results found for: Derl Barrow   Chemistry      Component Value Date/Time   NA 140 09/08/2013 1835   K 4.1 09/08/2013 1835   CL 98 09/08/2013 1835   CO2 23 09/08/2013 1835   BUN 20 09/08/2013 1835   CREATININE 0.76 09/08/2013 1835   CREATININE 0.81 08/06/2013 1203      Component Value Date/Time   CALCIUM 9.5 09/08/2013 1835   ALKPHOS 88 09/08/2013 1835   AST 11 09/08/2013 1835   ALT 10 09/08/2013 1835   BILITOT 0.3 09/08/2013 1835        Chemistry      Component Value Date/Time   NA 140 09/08/2013 1835   K 4.1 09/08/2013 1835   CL 98 09/08/2013 1835   CO2 23 09/08/2013 1835   BUN 20 09/08/2013 1835   CREATININE 0.76 09/08/2013 1835   CREATININE 0.81 08/06/2013 1203      Component Value Date/Time   CALCIUM 9.5 09/08/2013 1835   ALKPHOS 88 09/08/2013 1835   AST 11 09/08/2013 1835   ALT 10 09/08/2013 1835   BILITOT 0.3 09/08/2013 1835        Hemoglobin A1c 7.2   Assessment:  Diabetes mellitus without complication - Plan: POCT glycosylated hemoglobin (Hb A1C)  DM type 2 with diabetic dyslipidemia  Hypertension associated with diabetes  Type 2 diabetes mellitus without complication  Stroke  Need for prophylactic vaccination and inoculation against influenza - Plan: Flu vaccine HIGH DOSE PF (Fluzone Tri High dose)  Light smoker        Plan:    1.  Rx changes: none 2.  Education: Reviewed 'ABCs' of diabetes management (respective goals in parentheses):  A1C (<7), blood pressure (<130/80), and cholesterol (LDL <100). 3.  Compliance at present is estimated to be good. Efforts to improve compliance (if necessary) will be directed at increased exercise. 4. Follow up: 4 months   I encouraged her to again go ahead and quit smoking however at this time and not sure she will do this. Routine follow-up.

## 2014-07-01 ENCOUNTER — Ambulatory Visit: Payer: Medicare Other | Admitting: Family Medicine

## 2014-08-12 ENCOUNTER — Other Ambulatory Visit (HOSPITAL_COMMUNITY): Payer: Medicare Other

## 2014-08-12 ENCOUNTER — Ambulatory Visit: Payer: Medicare Other | Admitting: Family

## 2014-08-24 ENCOUNTER — Telehealth: Payer: Self-pay | Admitting: Internal Medicine

## 2014-08-24 ENCOUNTER — Other Ambulatory Visit: Payer: Self-pay

## 2014-08-24 DIAGNOSIS — E0840 Diabetes mellitus due to underlying condition with diabetic neuropathy, unspecified: Secondary | ICD-10-CM

## 2014-08-24 MED ORDER — SITAGLIPTIN PHOS-METFORMIN HCL 50-1000 MG PO TABS: 1.0000 | ORAL_TABLET | Freq: Two times a day (BID) | ORAL | Status: DC

## 2014-08-24 NOTE — Telephone Encounter (Signed)
Refill request for janumet to [facility]

## 2014-08-24 NOTE — Telephone Encounter (Signed)
done

## 2014-09-01 ENCOUNTER — Encounter: Payer: Self-pay | Admitting: Family

## 2014-09-02 ENCOUNTER — Ambulatory Visit: Payer: Self-pay | Admitting: Family

## 2014-09-02 ENCOUNTER — Other Ambulatory Visit (HOSPITAL_COMMUNITY): Payer: Self-pay

## 2014-09-20 ENCOUNTER — Encounter: Payer: Self-pay | Admitting: Neurology

## 2014-09-20 ENCOUNTER — Ambulatory Visit (INDEPENDENT_AMBULATORY_CARE_PROVIDER_SITE_OTHER): Payer: Medicare Other | Admitting: Neurology

## 2014-09-20 VITALS — BP 117/62 | HR 78 | Ht 65.5 in | Wt 154.2 lb

## 2014-09-20 DIAGNOSIS — Z9889 Other specified postprocedural states: Secondary | ICD-10-CM

## 2014-09-20 DIAGNOSIS — E785 Hyperlipidemia, unspecified: Secondary | ICD-10-CM

## 2014-09-20 DIAGNOSIS — E1140 Type 2 diabetes mellitus with diabetic neuropathy, unspecified: Secondary | ICD-10-CM | POA: Diagnosis not present

## 2014-09-20 DIAGNOSIS — I639 Cerebral infarction, unspecified: Secondary | ICD-10-CM | POA: Diagnosis not present

## 2014-09-20 DIAGNOSIS — G451 Carotid artery syndrome (hemispheric): Secondary | ICD-10-CM

## 2014-09-20 DIAGNOSIS — I10 Essential (primary) hypertension: Secondary | ICD-10-CM | POA: Diagnosis not present

## 2014-09-20 DIAGNOSIS — Z720 Tobacco use: Secondary | ICD-10-CM | POA: Diagnosis not present

## 2014-09-20 DIAGNOSIS — F17200 Nicotine dependence, unspecified, uncomplicated: Secondary | ICD-10-CM

## 2014-09-20 NOTE — Patient Instructions (Signed)
-   continue plavix and crestor for stroke prevention - continue meds for HTN and DM - check BP and glucose at home - Follow up with your primary care physician for stroke risk factor modification. Recommend maintain blood pressure goal <130/80, diabetes with hemoglobin A1c goal below 6.5% and lipids with LDL cholesterol goal below 70 mg/dL.  - quit smoking - follow up in 6 months

## 2014-09-20 NOTE — Progress Notes (Signed)
STROKE NEUROLOGY FOLLOW UP NOTE  NAME: [name] DOB: 13-Feb-1941  REASON FOR VISIT: stroke follow up HISTORY FROM: pt and chart  Today we had the pleasure of seeing [name] in follow-up at our [facility]rology [facility]. Pt was accompanied by no one.   History Summary [name] is a 74 y.o. female who comes to the office for first hospital follow up post hospital discharge for stroke. She has a history of HTN, HLD, DM and TIA in May 2014, presenting 06/27/2013 with new onset speech difficulty. She was taking aspirin daily but ran out of aspirin one week prior. CT scan of her head today showed no intracranial abnormality. MRI study showed small left posterior frontal ischemic cerebral infarction. MRA was negative. LDL 66, HgbA1c 7.6. 2D Echo with EF 55% to 60%. No cardiac source of emboli was indentified. Carotid dopplers showed left side >80% stenosis, and she had left endarterectomy by Dr. Oneida Alar.   Follow up 09/09/13 (LL) - She was hospitalized with sepsis from C diff Jan 2015. She recovered well without serious neurological deficits. She has mild word-finding difficulties and dysarthria when tired. Yesterday she had episode of numbness in her left hand which spread to the left side of her face, which resolved within a half-hour. She was taken to ER by family, but discharged with diagnosis of TIA. Her aspirin was changed to Plavix which she has not filled yet.   Follow up 03/18/14 (LL): Ms. Kropp comes to the office with her daughter for 6 month stroke followup. She states she is doing well and feels like she has fully recovered from stroke. Her blood pressure is well controlled, it is 129/72 in the office today. Her last Hgb a1c was 7.0, down from 7.8 previously. She has no recurrent symptoms of numbness, weakness or speech difficulties. She is tolerating Plavix well with no signs of significant bleeding or bruising. She has no new complaints today.  Interval History During the  interval time, the patient has been doing well. No recurrent stroke like symptoms. Her BP today 117/62. She said her BP and glucose are good at home. Her A1C in 06/2014 was 7.2, up from 7.0 in July. She continues to smoke, 6 cig per day. She is not ready to quit yet. She is on plavix and crestor. She is going to have CUS next Monday.   REVIEW OF SYSTEMS: Full 14 system review of systems performed and notable only for those listed below and in HPI above, all others are negative:  Constitutional:   Cardiovascular:  Ear/Nose/Throat:   Skin:  Eyes:   Respiratory:   Gastroitestinal:   Genitourinary:  Hematology/Lymphatic:   Endocrine:  Musculoskeletal:   Allergy/Immunology:   Neurological:   Psychiatric:  Sleep:   The following represents the patient's updated allergies and side effects list: Allergies  Allergen Reactions  . Codeine Nausea And Vomiting  . Lipitor [Atorvastatin] Nausea And Vomiting  . Phenergan [Promethazine Hcl] Other (See Comments)    Confusion, hallucinations, severe aggitation    The neurologically relevant items on the patient's problem list were reviewed on today's visit.  Neurologic Examination  A problem focused neurological exam (12 or more points of the single system neurologic examination, vital signs counts as 1 point, cranial nerves count for 8 points) was performed.  Blood pressure 117/62, pulse 78, height 5' 5.5" (1.664 m), weight 154 lb 3.2 oz (69.945 kg).  General - Well nourished, well developed, in no apparent distress.  Ophthalmologic -  Sharp disc margins OU.  Cardiovascular - Regular rate and rhythm with no murmur.  Mental Status -  Level of arousal and orientation to time, place, and person were intact. Language including expression, naming, repetition, comprehension was assessed and found intact.  Cranial Nerves II - XII - II - Visual field intact OU. III, IV, VI - Extraocular movements intact. V - Facial sensation intact  bilaterally. VII - Facial movement intact bilaterally. VIII - Hearing & vestibular intact bilaterally. X - Palate elevates symmetrically. XI - Chin turning & shoulder shrug intact bilaterally. XII - Tongue protrusion intact.  Motor Strength - The patient's strength was normal in all extremities and pronator drift was absent.  Bulk was normal and fasciculations were absent.   Motor Tone - Muscle tone was assessed at the neck and appendages and was normal.  Reflexes - The patient's reflexes were normal in all extremities and she had no pathological reflexes.  Sensory - Light touch, temperature/pinprick were assessed and were normal.    Coordination - The patient had normal movements in the hands and feet with no ataxia or dysmetria.  Tremor was absent.  Gait and Station - The patient's transfers, posture, gait, station, and turns were observed as normal.  Data reviewed: I personally reviewed the images and agree with the radiology interpretations.  06/27/13 - MRI and MRA -  Acute infarct left posterior frontal lobe involving cortex and white matter. This measures approximately 10 x 20 mm. Negative intracranial MRA.  07/24/13 MRI No new infarction when compared to the study of December. Some persistent residual restricted diffusion in the area of infarction in the left posterior frontal region which was acute in December. Mild chronic small-vessel changes elsewhere affecting the cerebral hemispheric white matter.  CUS 02/04/14 - right ICA < 40% and left ICA patent post op  CUS 06/29/13 - The vertebral arteries appear patent with antegrade flow. - Findings consistent with1- 39 percent stenosis involving the right internal carotid artery. - Findings consistent with greater than 80 percent stenosis involving the left internal carotid artery.  2D echo 07/27/13 - Left ventricle: The cavity size was normal. Wall thickness was increased in a pattern of mild LVH. Systolic function was  normal. The estimated ejection fraction was in the range of 50% to 55%. Wall motion was normal; there were no regional wall motion abnormalities. Left ventricular diastolic function parameters were normal.  Component     Latest Ref Rng 11/17/2012 06/28/2013 09/29/2013 02/10/2014  Cholesterol     0 - 200 mg/dL 196 156    Triglycerides     <150 mg/dL 185 (H) 208 (H)    HDL     >39 mg/dL 49 48    Total CHOL/HDL Ratio      4.0 3.3    VLDL     0 - 40 mg/dL 37 42 (H)    LDL (calc)     0 - 99 mg/dL 110 (H) 66    Hemoglobin A1C        7.8 7.0   Component     Latest Ref Rng 06/29/2014  Cholesterol     0 - 200 mg/dL   Triglycerides     <150 mg/dL   HDL     >39 mg/dL   Total CHOL/HDL Ratio        VLDL     0 - 40 mg/dL   LDL (calc)     0 - 99 mg/dL   Hemoglobin A1C  7.2    Assessment: As you may recall, she is a 74 y.o. Caucasian female with PMH of HTN, HLD, DM and TIA in May 2014, admitted on12/13/2014 for small left posterior frontal ischemic cerebral infarction showing on MRI. HgbA1c 7.6. Carotid dopplers showed left side >80% stenosis, and she had left endarterectomy by Dr. Oneida Alar.follow up CUS so far patent left ICA with no stenosis of right ICA. She had one TIA episode in 07/2013 with left hand face and arm weakness. MRI negative. Her ASA changed to plavix. Since then, she has no recurrent stroke symptoms. Her A1C improved over time and BP controlled well at home, however, she still not quit smoking yet.  Plan:  - continue plavix and crestor for stroke prevention - continue home meds - check BP and glucose at home - Follow up with your primary care physician for stroke risk factor modification. Recommend maintain blood pressure goal <130/80, diabetes with hemoglobin A1c goal below 6.5% and lipids with LDL cholesterol goal below 70 mg/dL.  - quit smoking - RTC in 6 months  No orders of the defined types were placed in this encounter.    No orders of the defined types  were placed in this encounter.    Patient Instructions  - continue plavix and crestor for stroke prevention - continue meds for HTN and DM - check BP and glucose at home - Follow up with your primary care physician for stroke risk factor modification. Recommend maintain blood pressure goal <130/80, diabetes with hemoglobin A1c goal below 6.5% and lipids with LDL cholesterol goal below 70 mg/dL.  - quit smoking - follow up in 6 months    Rosalin Hawking, MD PhD [facility] [address] [place], [place] [address] [phone]

## 2014-09-24 ENCOUNTER — Encounter: Payer: Self-pay | Admitting: Family

## 2014-09-27 ENCOUNTER — Other Ambulatory Visit: Payer: Self-pay | Admitting: *Deleted

## 2014-09-27 ENCOUNTER — Ambulatory Visit (INDEPENDENT_AMBULATORY_CARE_PROVIDER_SITE_OTHER): Payer: Medicare Other | Admitting: Family

## 2014-09-27 ENCOUNTER — Ambulatory Visit (HOSPITAL_COMMUNITY)
Admission: RE | Admit: 2014-09-27 | Discharge: 2014-09-27 | Disposition: A | Discharge status: Home / Self Care | Payer: Medicare Other | Source: Ambulatory Visit | Attending: Family | Admitting: Family

## 2014-09-27 ENCOUNTER — Encounter: Payer: Self-pay | Admitting: Family

## 2014-09-27 VITALS — BP 112/68 | HR 64 | Resp 16 | Ht 66.0 in | Wt 153.0 lb

## 2014-09-27 DIAGNOSIS — F17200 Nicotine dependence, unspecified, uncomplicated: Secondary | ICD-10-CM | POA: Insufficient documentation

## 2014-09-27 DIAGNOSIS — Z9889 Other specified postprocedural states: Secondary | ICD-10-CM | POA: Insufficient documentation

## 2014-09-27 DIAGNOSIS — I6521 Occlusion and stenosis of right carotid artery: Secondary | ICD-10-CM | POA: Diagnosis not present

## 2014-09-27 DIAGNOSIS — Z720 Tobacco use: Secondary | ICD-10-CM

## 2014-09-27 DIAGNOSIS — I6523 Occlusion and stenosis of bilateral carotid arteries: Secondary | ICD-10-CM | POA: Diagnosis present

## 2014-09-27 DIAGNOSIS — Z48812 Encounter for surgical aftercare following surgery on the circulatory system: Secondary | ICD-10-CM

## 2014-09-27 DIAGNOSIS — Z8673 Personal history of transient ischemic attack (TIA), and cerebral infarction without residual deficits: Secondary | ICD-10-CM

## 2014-09-27 NOTE — Progress Notes (Signed)
Established Carotid Patient   History of Present Illness  [name] is a 74 y.o. female patient of Dr. Oneida Alar who is s/p left CEA with Dacron patch on 07/04/13 for symptomatic left internal carotid stenosis with exophytic calcified plaque. She returns today for follow up.  Patient has Positive history of TIA (Feb., 2015), or stroke (Dec.,2014) symptom as manifested by right hand and right facial tingling and numbness, as manifested by TIA symtoms later and expressive aphasia and confusion , denies weakness on right side, denies monocular vision loss, denies unilateral facial drooping.   The patient's previous neurologic deficits are resolved. Pt denies clauduaction symptoms with walking, denies non healing wounds, denies any known cardiac problems. She has left knee issues after falling on her left knee and had surgery years ago. Pt states she walks a great deal.  Pt denies New Medical or Surgical History. She  had sepsis from c.diff, January, 2015.  Pt Diabetic: Yes, A1C in March, 2015 was 7.0, improved from 7.8 Pt smoker: smoker (1/3 ppd, started at age 59 yrs)  Pt meds include: Statin : Yes ASA: No Other anticoagulants/antiplatelets: Plavix, started Feb., 2015 when she had the TIA, prescribed by Dr. Leonie Man  Past Medical History  Diagnosis Date  . Neuropathy, diabetic   . Dyslipidemia     takes Crestor daily  . Smoker   . Hypertension     takes Prinzide and Verapamil daily  . Diabetes mellitus     takes Janumet daily  . PONV (postoperative nausea and vomiting)   . Impaired speech     from stroke  . Vertigo     but doesn't take any meds  . Stroke 06/25/13  . Carotid artery occlusion     Social History History  Substance Use Topics  . Smoking status: Light Tobacco Smoker    Last Attempt to Quit: 06/27/2013  . Smokeless tobacco: Never Used     Comment: hasn't smoked since Sat,Dec 13  . Alcohol Use: No    Family History Family History  Problem Relation  Age of Onset  . Cancer Mother     Ursula Beath  . Cancer Father     Stomach    Surgical History Past Surgical History  Procedure Laterality Date  . Cholecystectomy    . Knee surgery Left 68yrs ago  . Cataract removed Right   . Endarterectomy Left 07/14/2013    Procedure: ENDARTERECTOMY CAROTID-LEFT;  Surgeon: Elam Dutch, MD;  Location: [facility];  Service: Vascular;  Laterality: Left;  . Patch angioplasty Left 07/14/2013    Procedure: LEFT CAROTID ARTERY PATCH ANGIOPLASTY;  Surgeon: Elam Dutch, MD;  Location: [place] [facility];  Service: Vascular;  Laterality: Left;  [facility] Kitchen Eye surgery    . Carotid endarterectomy Left 07-14-13    cea    Allergies  Allergen Reactions  . Codeine Nausea And Vomiting  . Lipitor [Atorvastatin] Nausea And Vomiting  . Phenergan [Promethazine Hcl] Other (See Comments)    Confusion, hallucinations, severe aggitation    Current Outpatient Prescriptions  Medication Sig Dispense Refill  . clopidogrel (PLAVIX) 75 MG tablet take 1 tablet by mouth once daily 30 tablet 6  . glucose blood (ACCU-CHEK AVIVA) test strip Test sugar once daily 300 each 3  . Lancet Devices (ACCU-CHEK SOFTCLIX) lancets Test blood sugar once daily 300 each 3  . lisinopril-hydrochlorothiazide (PRINZIDE,ZESTORETIC) 20-12.5 MG per tablet TAKE ONE TABLET BY MOUTH ONCE DAILY 30 tablet 5  . rosuvastatin (CRESTOR) 20 MG tablet Take 1 tablet (20  mg total) by mouth daily. 30 tablet 11  . sitaGLIPtin-metformin (JANUMET) 50-1000 MG per tablet Take 1 tablet by mouth 2 (two) times daily with a meal. 180 tablet 1  . verapamil (CALAN-SR) 240 MG CR tablet take 1 tablet by mouth once daily 60 tablet 6   No current facility-administered medications for this visit.    Review of Systems : See HPI for pertinent positives and negatives.  Physical Examination  Filed Vitals:   09/27/14 1146 09/27/14 1150  BP: 122/71 112/68  Pulse: 67 64  Resp:  16  Height:  5\' 6"  (1.676 m)  Weight:  153 lb (69.4 kg)  SpO2:   97%   Body mass index is 24.71 kg/(m^2).  General: WDWN female in NAD GAIT: normal Eyes: PERRLA Pulmonary: Non-labored, CTAB, Negative Rales, Negative rhonchi, & Negative wheezing.  Cardiac: regular Rhythm , Positive detected low or mid grade murmur.  VASCULAR EXAM Carotid Bruits Left Right   negative Positive, mild  aorta is not palpable Radial pulses are 1+ palpable and equal.      LE Pulses LEFT RIGHT   POPLITEAL not palpable  not palpable   POSTERIOR TIBIAL not palpable  not palpable    DORSALIS PEDIS  ANTERIOR TIBIAL not palpable  not palpable     Gastrointestinal: soft, nontender, BS WNL, no r/g, no palpable masses.  Musculoskeletal: Negative muscle atrophy/wasting. M/S 5/5 throughout, Extremities without ischemic changes.  Neurologic: A&O X 3; Appropriate Affect ; SENSATION ;normal;  Speech is normal CN 2-12 intact, Pain and light touch intact in extremities, Motor exam as listed above.          Non-Invasive Vascular Imaging CAROTID DUPLEX 09/27/2014   CEREBROVASCULAR DUPLEX EVALUATION    INDICATION: Carotid artery disease    PREVIOUS INTERVENTION(S): Left carotid endarterectomy 07/14/2013    DUPLEX EXAM: Carotid duplex    RIGHT  LEFT  Peak Systolic Velocities (cm/s) End Diastolic Velocities (cm/s) Plaque LOCATION Peak Systolic Velocities (cm/s) End Diastolic Velocities (cm/s) Plaque  96 13 - CCA PROXIMAL 92 17 -  82 16 - CCA MID 104 21 -  80 15 - CCA DISTAL 98 19 HM  113 13 - ECA 124 19 -  65 13 HT ICA PROXIMAL 83 15 HM  84 19 - ICA MID 90 26 -  94 21 - ICA DISTAL 86 22 -    1.1 ICA / CCA Ratio (PSV) N/A  Antegrade Vertebral Flow Antegrade  841 Brachial Systolic Pressure (mmHg) 660  Triphasic Brachial Artery Waveforms Biphasic    Plaque Morphology:  HM = Homogeneous, HT  = Heterogeneous, CP = Calcific Plaque, SP = Smooth Plaque, IP = Irregular Plaque  ADDITIONAL FINDINGS:     IMPRESSION: 1. Less than 40% right internal carotid artery stenosis. 2. Patent left carotid endarterectomy with no evidence for restenosis.    Compared to the previous exam:  No change      Assessment: [name] is a 74 y.o. female who is s/p left CEA with Dacron patch on 07/04/13 for symptomatic left internal carotid stenosis with exophytic calcified plaque. She presents with asymptomatic minimal right ICA stenosis and a patent left carotid endarterectomy with no evidence for restenosis.  The  ICA stenosis is  Unchanged from previous exam.  Pedal pulses are not palpable, no ischemic changes in feet and lower legs. Review of records: ABI's done December 2014 were in normal range with all biphasic waveforms.  Her DM appears to be in control but unfortunately she continues  to smoke.  The patient was counseled re smoking cessation and given several free resources re smoking cessation.  Face to face time with patient was 25 minutes. Over 50% of this time was spent on counseling and coordination of care.    Plan: Follow-up in 1 year with Carotid Duplex. The patient was counseled re smoking cessation and given several free resources re smoking cessation.    I discussed in depth with the patient the nature of atherosclerosis, and emphasized the importance of maximal medical management including strict control of blood pressure, blood glucose, and lipid levels, obtaining regular exercise, and cessation of smoking.  The patient is aware that without maximal medical management the underlying atherosclerotic disease process will progress, limiting the benefit of any interventions. The patient was given information about stroke prevention and what symptoms should prompt the patient to seek immediate medical care. Thank you for allowing [place] to participate in this patient's  care.2  Clemon Chambers, RN, MSN, FNP-C [facility] Office: [place]: Trula Slade  09/27/2014 12:12 PM

## 2014-09-27 NOTE — Patient Instructions (Addendum)
Stroke Prevention Some medical conditions and behaviors are associated with an increased chance of having a stroke. You may prevent a stroke by making healthy choices and managing medical conditions. HOW CAN I REDUCE MY RISK OF HAVING A STROKE?   Stay physically active. Get at least 30 minutes of activity on most or all days.  Do not smoke. It may also be helpful to avoid exposure to secondhand smoke.  Limit alcohol use. Moderate alcohol use is considered to be:  No more than 2 drinks per day for men.  No more than 1 drink per day for nonpregnant women.  Eat healthy foods. This involves:  Eating 5 or more servings of fruits and vegetables a day.  Making dietary changes that address high blood pressure (hypertension), high cholesterol, diabetes, or obesity.  Manage your cholesterol levels.  Making food choices that are high in fiber and low in saturated fat, trans fat, and cholesterol may control cholesterol levels.  Take any prescribed medicines to control cholesterol as directed by your health care provider.  Manage your diabetes.  Controlling your carbohydrate and sugar intake is recommended to manage diabetes.  Take any prescribed medicines to control diabetes as directed by your health care provider.  Control your hypertension.  Making food choices that are low in salt (sodium), saturated fat, trans fat, and cholesterol is recommended to manage hypertension.  Take any prescribed medicines to control hypertension as directed by your health care provider.  Maintain a healthy weight.  Reducing calorie intake and making food choices that are low in sodium, saturated fat, trans fat, and cholesterol are recommended to manage weight.  Stop drug abuse.  Avoid taking birth control pills.  Talk to your health care provider about the risks of taking birth control pills if you are over 35 years old, smoke, get migraines, or have ever had a blood clot.  Get evaluated for sleep  disorders (sleep apnea).  Talk to your health care provider about getting a sleep evaluation if you snore a lot or have excessive sleepiness.  Take medicines only as directed by your health care provider.  For some people, aspirin or blood thinners (anticoagulants) are helpful in reducing the risk of forming abnormal blood clots that can lead to stroke. If you have the irregular heart rhythm of atrial fibrillation, you should be on a blood thinner unless there is a good reason you cannot take them.  Understand all your medicine instructions.  Make sure that other conditions (such as anemia or atherosclerosis) are addressed. SEEK IMMEDIATE MEDICAL CARE IF:   You have sudden weakness or numbness of the face, arm, or leg, especially on one side of the body.  Your face or eyelid droops to one side.  You have sudden confusion.  You have trouble speaking (aphasia) or understanding.  You have sudden trouble seeing in one or both eyes.  You have sudden trouble walking.  You have dizziness.  You have a loss of balance or coordination.  You have a sudden, severe headache with no known cause.  You have new chest pain or an irregular heartbeat. Any of these symptoms may represent a serious problem that is an emergency. Do not wait to see if the symptoms will go away. Get medical help at once. Call your local emergency services (911 in [place] Do not drive yourself to the hospital. Document Released: 08/09/2004 Document Revised: 11/16/2013 Document Reviewed: 01/02/2013 ExitCare Patient Information 2015 [employer]. This information is not intended to replace advice given   to you by your health care provider. Make sure you discuss any questions you have with your health care provider.   Smoking Cessation Quitting smoking is important to your health and has many advantages. However, it is not always easy to quit since nicotine is a very addictive drug. Oftentimes, people try 3 times or more  before being able to quit. This document explains the best ways for you to prepare to quit smoking. Quitting takes hard work and a lot of effort, but you can do it. ADVANTAGES OF QUITTING SMOKING  You will live longer, feel better, and live better.  Your body will feel the impact of quitting smoking almost immediately.  Within 20 minutes, blood pressure decreases. Your pulse returns to its normal level.  After 8 hours, carbon monoxide levels in the blood return to normal. Your oxygen level increases.  After 24 hours, the chance of having a heart attack starts to decrease. Your breath, hair, and body stop smelling like smoke.  After 48 hours, damaged nerve endings begin to recover. Your sense of taste and smell improve.  After 72 hours, the body is virtually free of nicotine. Your bronchial tubes relax and breathing becomes easier.  After 2 to 12 weeks, lungs can hold more air. Exercise becomes easier and circulation improves.  The risk of having a heart attack, stroke, cancer, or lung disease is greatly reduced.  After 1 year, the risk of coronary heart disease is cut in half.  After 5 years, the risk of stroke falls to the same as a nonsmoker.  After 10 years, the risk of lung cancer is cut in half and the risk of other cancers decreases significantly.  After 15 years, the risk of coronary heart disease drops, usually to the level of a nonsmoker.  If you are pregnant, quitting smoking will improve your chances of having a healthy baby.  The people you live with, especially any children, will be healthier.  You will have extra money to spend on things other than cigarettes. QUESTIONS TO THINK ABOUT BEFORE ATTEMPTING TO QUIT You may want to talk about your answers with your health care provider.  Why do you want to quit?  If you tried to quit in the past, what helped and what did not?  What will be the most difficult situations for you after you quit? How will you plan to  handle them?  Who can help you through the tough times? Your family? Friends? A health care provider?  What pleasures do you get from smoking? What ways can you still get pleasure if you quit? Here are some questions to ask your health care provider:  How can you help me to be successful at quitting?  What medicine do you think would be best for me and how should I take it?  What should I do if I need more help?  What is smoking withdrawal like? How can I get information on withdrawal? GET READY  Set a quit date.  Change your environment by getting rid of all cigarettes, ashtrays, matches, and lighters in your home, car, or work. Do not let people smoke in your home.  Review your past attempts to quit. Think about what worked and what did not. GET SUPPORT AND ENCOURAGEMENT You have a better chance of being successful if you have help. You can get support in many ways.  Tell your family, friends, and coworkers that you are going to quit and need their support. Ask them not   to smoke around you.  Get individual, group, or telephone counseling and support. Programs are available at local hospitals and health centers. Call your local health department for information about programs in your area.  Spiritual beliefs and practices may help some smokers quit.  Download a "quit meter" on your computer to keep track of quit statistics, such as how long you have gone without smoking, cigarettes not smoked, and money saved.  Get a self-help book about quitting smoking and staying off tobacco. LEARN NEW SKILLS AND BEHAVIORS  Distract yourself from urges to smoke. Talk to someone, go for a walk, or occupy your time with a task.  Change your normal routine. Take a different route to work. Drink tea instead of coffee. Eat breakfast in a different place.  Reduce your stress. Take a hot bath, exercise, or read a book.  Plan something enjoyable to do every day. Reward yourself for not  smoking.  Explore interactive web-based programs that specialize in helping you quit. GET MEDICINE AND USE IT CORRECTLY Medicines can help you stop smoking and decrease the urge to smoke. Combining medicine with the above behavioral methods and support can greatly increase your chances of successfully quitting smoking.  Nicotine replacement therapy helps deliver nicotine to your body without the negative effects and risks of smoking. Nicotine replacement therapy includes nicotine gum, lozenges, inhalers, nasal sprays, and skin patches. Some may be available over-the-counter and others require a prescription.  Antidepressant medicine helps people abstain from smoking, but how this works is unknown. This medicine is available by prescription.  Nicotinic receptor partial agonist medicine simulates the effect of nicotine in your brain. This medicine is available by prescription. Ask your health care provider for advice about which medicines to use and how to use them based on your health history. Your health care provider will tell you what side effects to look out for if you choose to be on a medicine or therapy. Carefully read the information on the package. Do not use any other product containing nicotine while using a nicotine replacement product.  RELAPSE OR DIFFICULT SITUATIONS Most relapses occur within the first 3 months after quitting. Do not be discouraged if you start smoking again. Remember, most people try several times before finally quitting. You may have symptoms of withdrawal because your body is used to nicotine. You may crave cigarettes, be irritable, feel very hungry, cough often, get headaches, or have difficulty concentrating. The withdrawal symptoms are only temporary. They are strongest when you first quit, but they will go away within 10-14 days. To reduce the chances of relapse, try to:  Avoid drinking alcohol. Drinking lowers your chances of successfully quitting.  Reduce the  amount of caffeine you consume. Once you quit smoking, the amount of caffeine in your body increases and can give you symptoms, such as a rapid heartbeat, sweating, and anxiety.  Avoid smokers because they can make you want to smoke.  Do not let weight gain distract you. Many smokers will gain weight when they quit, usually less than 10 pounds. Eat a healthy diet and stay active. You can always lose the weight gained after you quit.  Find ways to improve your mood other than smoking. FOR MORE INFORMATION  www.smokefree.gov  Document Released: 06/26/2001 Document Revised: 11/16/2013 Document Reviewed: 10/11/2011 ExitCare Patient Information 2015 [employer]. This information is not intended to replace advice given to you by your health care provider. Make sure you discuss any questions you have with your health care   provider.    Smoking Cessation, Tips for Success If you are ready to quit smoking, congratulations! You have chosen to help yourself be healthier. Cigarettes bring nicotine, tar, carbon monoxide, and other irritants into your body. Your lungs, heart, and blood vessels will be able to work better without these poisons. There are many different ways to quit smoking. Nicotine gum, nicotine patches, a nicotine inhaler, or nicotine nasal spray can help with physical craving. Hypnosis, support groups, and medicines help break the habit of smoking. WHAT THINGS CAN I DO TO MAKE QUITTING EASIER?  Here are some tips to help you quit for good:  Pick a date when you will quit smoking completely. Tell all of your friends and family about your plan to quit on that date.  Do not try to slowly cut down on the number of cigarettes you are smoking. Pick a quit date and quit smoking completely starting on that day.  Throw away all cigarettes.   Clean and remove all ashtrays from your home, work, and car.  On a card, write down your reasons for quitting. Carry the card with you and read it  when you get the urge to smoke.  Cleanse your body of nicotine. Drink enough water and fluids to keep your urine clear or pale yellow. Do this after quitting to flush the nicotine from your body.  Learn to predict your moods. Do not let a bad situation be your excuse to have a cigarette. Some situations in your life might tempt you into wanting a cigarette.  Never have "just one" cigarette. It leads to wanting another and another. Remind yourself of your decision to quit.  Change habits associated with smoking. If you smoked while driving or when feeling stressed, try other activities to replace smoking. Stand up when drinking your coffee. Brush your teeth after eating. Sit in a different chair when you read the paper. Avoid alcohol while trying to quit, and try to drink fewer caffeinated beverages. [place]            [id number]   [place] and caffeine may urge you to smoke.  Avoid foods and drinks that can trigger a desire to smoke, such as sugary or spicy foods and alcohol.  Ask people who smoke not to smoke around you.  Have something planned to do right after eating or having a cup of coffee. For example, plan to take a walk or exercise.  Try a relaxation exercise to calm you down and decrease your stress. Remember, you may be tense and nervous for the first 2 weeks after you quit, but this will pass.  Find new activities to keep your hands busy. Play with a pen, coin, or rubber band. Doodle or draw things on paper.  Brush your teeth right after eating. This will help cut down on the craving for the taste of tobacco after meals. You can also try mouthwash.   Use oral substitutes in place of cigarettes. Try using lemon drops, carrots, cinnamon sticks, or chewing gum. Keep them handy so they are available when you have the urge to smoke.  When you have the urge to smoke, try deep breathing.  Designate your home as a nonsmoking area.  If you are a heavy smoker, ask your health care provider  about a prescription for nicotine chewing gum. It can ease your withdrawal from nicotine.  Reward yourself. Set aside the cigarette money you save and buy yourself something nice.  Look for support from others. Join  a support group or smoking cessation program. Ask someone at home or at work to help you with your plan to quit smoking.  Always ask yourself, "Do I need this cigarette or is this just a reflex?" Tell yourself, "Today, I choose not to smoke," or "I do not want to smoke." You are reminding yourself of your decision to quit.  Do not replace cigarette smoking with electronic cigarettes (commonly called e-cigarettes). The safety of e-cigarettes is unknown, and some may contain harmful chemicals.  If you relapse, do not give up! Plan ahead and think about what you will do the next time you get the urge to smoke. HOW WILL I FEEL WHEN I QUIT SMOKING? You may have symptoms of withdrawal because your body is used to nicotine (the addictive substance in cigarettes). You may crave cigarettes, be irritable, feel very hungry, cough often, get headaches, or have difficulty concentrating. The withdrawal symptoms are only temporary. They are strongest when you first quit but will go away within 10-14 days. When withdrawal symptoms occur, stay in control. Think about your reasons for quitting. Remind yourself that these are signs that your body is healing and getting used to being without cigarettes. Remember that withdrawal symptoms are easier to treat than the major diseases that smoking can cause.  Even after the withdrawal is over, expect periodic urges to smoke. However, these cravings are generally short lived and will go away whether you smoke or not. Do not smoke! WHAT RESOURCES ARE AVAILABLE TO HELP ME QUIT SMOKING? Your health care provider can direct you to community resources or hospitals for support, which may include:  Group support.  Education.  Hypnosis.  Therapy. Document  Released: 03/30/2004 Document Revised: 11/16/2013 Document Reviewed: 12/18/2012 [facility] Patient Information 2015 [place], [place]. This information is not intended to replace advice given to you by your health care provider. Make sure you discuss any questions you have with your health care provider.

## 2014-10-03 ENCOUNTER — Other Ambulatory Visit: Payer: Self-pay | Admitting: Family Medicine

## 2014-10-04 ENCOUNTER — Other Ambulatory Visit: Payer: Self-pay | Admitting: Family Medicine

## 2014-10-20 ENCOUNTER — Other Ambulatory Visit: Payer: Self-pay | Admitting: Family Medicine

## 2014-11-02 ENCOUNTER — Encounter: Payer: Self-pay | Admitting: Family Medicine

## 2014-11-02 ENCOUNTER — Ambulatory Visit (INDEPENDENT_AMBULATORY_CARE_PROVIDER_SITE_OTHER): Payer: Medicare Other | Admitting: Family Medicine

## 2014-11-02 VITALS — BP 130/70 | HR 86 | Ht 66.0 in | Wt 150.0 lb

## 2014-11-02 DIAGNOSIS — E0840 Diabetes mellitus due to underlying condition with diabetic neuropathy, unspecified: Secondary | ICD-10-CM

## 2014-11-02 DIAGNOSIS — E785 Hyperlipidemia, unspecified: Secondary | ICD-10-CM

## 2014-11-02 DIAGNOSIS — E1159 Type 2 diabetes mellitus with other circulatory complications: Secondary | ICD-10-CM

## 2014-11-02 DIAGNOSIS — Z720 Tobacco use: Secondary | ICD-10-CM

## 2014-11-02 DIAGNOSIS — F17200 Nicotine dependence, unspecified, uncomplicated: Secondary | ICD-10-CM

## 2014-11-02 DIAGNOSIS — E1169 Type 2 diabetes mellitus with other specified complication: Secondary | ICD-10-CM

## 2014-11-02 DIAGNOSIS — I10 Essential (primary) hypertension: Secondary | ICD-10-CM | POA: Diagnosis not present

## 2014-11-02 DIAGNOSIS — E1140 Type 2 diabetes mellitus with diabetic neuropathy, unspecified: Secondary | ICD-10-CM

## 2014-11-02 DIAGNOSIS — F17210 Nicotine dependence, cigarettes, uncomplicated: Secondary | ICD-10-CM

## 2014-11-02 LAB — POCT GLYCOSYLATED HEMOGLOBIN (HGB A1C): Hemoglobin A1C: 7.8

## 2014-11-02 LAB — POCT UA - MICROALBUMIN
Albumin/Creatinine Ratio, Urine, POC: 8.6
Creatinine, POC: 142.8 mg/dL
Microalbumin Ur, POC: 12.3 mg/L

## 2014-11-02 NOTE — Progress Notes (Signed)
  Subjective:    Patient ID: [name], female    DOB: 02-13-1941, 74 y.o.   MRN: [id number]  [name] is a 74 y.o. female who presents for follow-up of Type 2 diabetes mellitus.  Home blood sugar records: Patient test bid . Patient admits to dietary indiscretion. Current symptoms/problems high in the mornings  Daily foot checks:   Any foot concerns: none Exercise: none. She states that she is active around her house.  Eyes:04/15/14 The following portions of the patient's history were reviewed and updated as appropriate: allergies, current medications, past medical history, past social history and problem list.  ROS as in subjective above.     Objective:    Physical Exam Alert and in no distress otherwise not examined.  Lab Review Diabetic Labs Latest Ref Rng 06/29/2014 02/10/2014 09/29/2013 09/08/2013 08/06/2013  HbA1c - 7.2 7.0 7.8 - -  Chol 0 - 200 mg/dL - - - - -  HDL >39 mg/dL - - - - -  Calc LDL 0 - 99 mg/dL - - - - -  Triglycerides <150 mg/dL - - - - -  Creatinine 0.50 - 1.10 mg/dL - - - 0.76 0.81   BP/Weight 09/27/2014 09/20/2014 06/29/2014 03/18/2014 9/75/8832  Systolic BP 549 826 415 830 940  Diastolic BP 68 62 84 72 80  Wt. (Lbs) 153 154.2 154 152 150  BMI 24.71 25.26 25.23 24.9 23.85   Foot/eye exam completion dates Latest Ref Rng 04/15/2014 06/09/2013  Eye Exam No Retinopathy No Retinopathy -  Foot Form Completion - - Done    [name]  reports that she has been smoking.  She has never used smokeless tobacco. She reports that she does not drink alcohol or use illicit drugs.  She is not interested in quitting smoking     Assessment & Plan:    Type 2 diabetes mellitus with diabetic neuropathy - Plan: POCT glycosylated hemoglobin (Hb A1C), POCT UA - Microalbumin  Smoker  Light smoker  Hypertension associated with diabetes  DM type 2 with diabetic dyslipidemia  Diabetes mellitus due to underlying condition with diabetic neuropathy    1. Rx changes:  none I had along discussion with her concerning possibly adding another medication since she is close to an A1c of 8. She will make an effort to improve her diet and exercise. 2. Education: Reviewed 'ABCs' of diabetes management (respective goals in parentheses):  A1C (<7), blood pressure (<130/80), and cholesterol (LDL <100). 3. Compliance at present is estimated to be fair. Efforts to improve compliance (if necessary) will be directed at dietary modifications: cutting back on high sugar drinks. 4. Follow up: 4 months

## 2014-11-02 NOTE — Patient Instructions (Addendum)
Use Claritin or Allegra and also Flonase nasal spray regularly. If this doesn't work and give me a call. Work on [employer] and exercise to get your blood sugars down and if not, the next time you come in it will be time for more medicine

## 2014-11-29 ENCOUNTER — Other Ambulatory Visit: Payer: Self-pay | Admitting: Family Medicine

## 2014-11-29 ENCOUNTER — Other Ambulatory Visit: Payer: Self-pay

## 2014-11-29 MED ORDER — VERAPAMIL HCL ER 240 MG PO TBCR: 240.0000 mg | EXTENDED_RELEASE_TABLET | Freq: Every day | ORAL | Status: DC

## 2014-11-29 NOTE — Telephone Encounter (Signed)
I have changed diabetes check to physical with dm

## 2014-11-29 NOTE — Telephone Encounter (Signed)
-----   Message from [name], MD sent at 11/29/2014  4:22 PM EDT ----- Sedative up as a CPE in August and I'll do everything at that time ----- Message -----    From: Koren Bound, CMA    Sent: 11/29/2014   2:19 PM      To: [name], MD  Pt comes in every 4 months for diabetes check. And got a refill today for bp med. Pt has an appt in august for another diabete check but does pt need to come in for a cpe prior or will you handle this at visit  [place]

## 2014-12-01 ENCOUNTER — Other Ambulatory Visit: Payer: Self-pay | Admitting: Nurse Practitioner

## 2014-12-01 ENCOUNTER — Other Ambulatory Visit: Payer: Self-pay | Admitting: Family Medicine

## 2014-12-02 ENCOUNTER — Other Ambulatory Visit: Payer: Self-pay | Admitting: Nurse Practitioner

## 2014-12-21 ENCOUNTER — Other Ambulatory Visit: Payer: Self-pay | Admitting: Family Medicine

## 2014-12-21 ENCOUNTER — Telehealth: Payer: Self-pay | Admitting: Internal Medicine

## 2014-12-21 MED ORDER — LISINOPRIL-HYDROCHLOROTHIAZIDE 20-12.5 MG PO TABS: 1.0000 | ORAL_TABLET | Freq: Every day | ORAL | Status: DC

## 2014-12-21 MED ORDER — VERAPAMIL HCL ER 240 MG PO TBCR: 240.0000 mg | EXTENDED_RELEASE_TABLET | Freq: Every day | ORAL | Status: DC

## 2014-12-21 NOTE — Telephone Encounter (Signed)
done

## 2015-01-21 ENCOUNTER — Other Ambulatory Visit: Payer: Self-pay

## 2015-01-21 DIAGNOSIS — E785 Hyperlipidemia, unspecified: Secondary | ICD-10-CM

## 2015-01-21 MED ORDER — ROSUVASTATIN CALCIUM 20 MG PO TABS: 20.0000 mg | ORAL_TABLET | Freq: Every day | ORAL | Status: DC

## 2015-02-02 ENCOUNTER — Encounter: Payer: Self-pay | Admitting: Cardiology

## 2015-03-02 ENCOUNTER — Telehealth: Payer: Self-pay | Admitting: Family Medicine

## 2015-03-02 NOTE — Telephone Encounter (Signed)
Call the patient and make sure she is checking her blood sugars and have her come in to see me tomorrow or Friday

## 2015-03-02 NOTE — Telephone Encounter (Signed)
[name] with [facility] t# [phone] called to let you know that she checked pt's urine and it had 4+ Glucose and she wanted to let you know

## 2015-03-02 NOTE — Telephone Encounter (Signed)
Spoke with patient, pt states she has an appt on Tuesday for Medcheck plus and she is just going to keep that appt and will see you on Tuesday.

## 2015-03-08 ENCOUNTER — Ambulatory Visit (INDEPENDENT_AMBULATORY_CARE_PROVIDER_SITE_OTHER): Payer: Medicare Other | Admitting: Family Medicine

## 2015-03-08 ENCOUNTER — Ambulatory Visit
Admission: RE | Admit: 2015-03-08 | Discharge: 2015-03-08 | Disposition: A | Discharge status: Home / Self Care | Payer: Medicare Other | Source: Ambulatory Visit | Attending: Family Medicine | Admitting: Family Medicine

## 2015-03-08 ENCOUNTER — Encounter: Payer: Self-pay | Admitting: Family Medicine

## 2015-03-08 VITALS — BP 130/70 | HR 90 | Ht 66.5 in | Wt 152.0 lb

## 2015-03-08 DIAGNOSIS — M6740 Ganglion, unspecified site: Secondary | ICD-10-CM

## 2015-03-08 DIAGNOSIS — Z9889 Other specified postprocedural states: Secondary | ICD-10-CM | POA: Diagnosis not present

## 2015-03-08 DIAGNOSIS — E1140 Type 2 diabetes mellitus with diabetic neuropathy, unspecified: Secondary | ICD-10-CM | POA: Diagnosis not present

## 2015-03-08 DIAGNOSIS — Z720 Tobacco use: Secondary | ICD-10-CM | POA: Diagnosis not present

## 2015-03-08 DIAGNOSIS — E1169 Type 2 diabetes mellitus with other specified complication: Secondary | ICD-10-CM

## 2015-03-08 DIAGNOSIS — M19032 Primary osteoarthritis, left wrist: Secondary | ICD-10-CM | POA: Diagnosis not present

## 2015-03-08 DIAGNOSIS — I152 Hypertension secondary to endocrine disorders: Secondary | ICD-10-CM

## 2015-03-08 DIAGNOSIS — E1159 Type 2 diabetes mellitus with other circulatory complications: Secondary | ICD-10-CM

## 2015-03-08 DIAGNOSIS — E785 Hyperlipidemia, unspecified: Secondary | ICD-10-CM

## 2015-03-08 DIAGNOSIS — E1136 Type 2 diabetes mellitus with diabetic cataract: Secondary | ICD-10-CM

## 2015-03-08 DIAGNOSIS — E0840 Diabetes mellitus due to underlying condition with diabetic neuropathy, unspecified: Secondary | ICD-10-CM | POA: Diagnosis not present

## 2015-03-08 DIAGNOSIS — F17210 Nicotine dependence, cigarettes, uncomplicated: Secondary | ICD-10-CM

## 2015-03-08 DIAGNOSIS — I10 Essential (primary) hypertension: Secondary | ICD-10-CM | POA: Diagnosis not present

## 2015-03-08 LAB — CBC WITH DIFFERENTIAL/PLATELET
BASOS PCT: 0 % (ref 0–1)
Basophils Absolute: 0 10*3/uL (ref 0.0–0.1)
EOS ABS: 0.3 10*3/uL (ref 0.0–0.7)
Eosinophils Relative: 2 % (ref 0–5)
HCT: 40.3 % (ref 36.0–46.0)
Hemoglobin: 13.4 g/dL (ref 12.0–15.0)
Lymphocytes Relative: 26 % (ref 12–46)
Lymphs Abs: 3.3 10*3/uL (ref 0.7–4.0)
MCH: 30.6 pg (ref 26.0–34.0)
MCHC: 33.3 g/dL (ref 30.0–36.0)
MCV: 92 fL (ref 78.0–100.0)
MONOS PCT: 6 % (ref 3–12)
MPV: 11 fL (ref 8.6–12.4)
Monocytes Absolute: 0.8 10*3/uL (ref 0.1–1.0)
NEUTROS PCT: 66 % (ref 43–77)
Neutro Abs: 8.3 10*3/uL — ABNORMAL HIGH (ref 1.7–7.7)
PLATELETS: 418 10*3/uL — AB (ref 150–400)
RBC: 4.38 MIL/uL (ref 3.87–5.11)
RDW: 15.1 % (ref 11.5–15.5)
WBC: 12.5 10*3/uL — AB (ref 4.0–10.5)

## 2015-03-08 LAB — COMPREHENSIVE METABOLIC PANEL
ALBUMIN: 4.5 g/dL (ref 3.6–5.1)
ALK PHOS: 57 U/L (ref 33–130)
ALT: 13 U/L (ref 6–29)
AST: 14 U/L (ref 10–35)
BILIRUBIN TOTAL: 0.8 mg/dL (ref 0.2–1.2)
BUN: 14 mg/dL (ref 7–25)
CALCIUM: 9.8 mg/dL (ref 8.6–10.4)
CO2: 27 mmol/L (ref 20–31)
Chloride: 101 mmol/L (ref 98–110)
Creat: 0.87 mg/dL (ref 0.60–0.93)
GLUCOSE: 146 mg/dL — AB (ref 65–99)
POTASSIUM: 4.4 mmol/L (ref 3.5–5.3)
Sodium: 140 mmol/L (ref 135–146)
TOTAL PROTEIN: 6.9 g/dL (ref 6.1–8.1)

## 2015-03-08 LAB — LIPID PANEL
CHOL/HDL RATIO: 2.5 ratio (ref <=5.0)
CHOLESTEROL: 131 mg/dL (ref 125–200)
HDL: 53 mg/dL (ref >=46)
LDL Cholesterol: 48 mg/dL (ref <130)
Triglycerides: 151 mg/dL — ABNORMAL HIGH (ref <150)
VLDL: 30 mg/dL (ref <30)

## 2015-03-08 LAB — POCT GLYCOSYLATED HEMOGLOBIN (HGB A1C): HEMOGLOBIN A1C: 7.7

## 2015-03-08 MED ORDER — SITAGLIPTIN PHOS-METFORMIN HCL 50-1000 MG PO TABS: 1.0000 | ORAL_TABLET | Freq: Two times a day (BID) | ORAL | Status: DC

## 2015-03-08 MED ORDER — ROSUVASTATIN CALCIUM 20 MG PO TABS: 20.0000 mg | ORAL_TABLET | Freq: Every day | ORAL | Status: DC

## 2015-03-08 MED ORDER — CLOPIDOGREL BISULFATE 75 MG PO TABS: 75.0000 mg | ORAL_TABLET | Freq: Every day | ORAL | Status: DC

## 2015-03-08 MED ORDER — ACCU-CHEK SOFTCLIX LANCET DEV MISC: Status: DC

## 2015-03-08 MED ORDER — GLUCOSE BLOOD VI STRP: ORAL_STRIP | Status: DC

## 2015-03-08 MED ORDER — VERAPAMIL HCL ER 240 MG PO TBCR: 240.0000 mg | EXTENDED_RELEASE_TABLET | Freq: Every day | ORAL | Status: DC

## 2015-03-08 MED ORDER — LISINOPRIL-HYDROCHLOROTHIAZIDE 20-12.5 MG PO TABS: 1.0000 | ORAL_TABLET | Freq: Every day | ORAL | Status: DC

## 2015-03-08 NOTE — Progress Notes (Signed)
Subjective:    Patient ID: [name], female    DOB: [birth year], 74 y.o.   MRN: [id number]  HPI She is here for medication check. Does complain of a growing lesion in the area of the left radial styloid. No history of injury to that area. Over the last several months it apparently has grown. She does have underlying diabetes. Her blood sugars have been running in the 170 range. She had an eye exam in April. She does check her feet periodically. She is not involved in a regular exercise regimen. Her eating habits are unchanged.She continues to smoke and is not interested in quitting.She has had a carotid endarterectomy in 2014. She continues on Plavix. She has remote history of patellar fracture but no difficulty with that. She does have cataracts and apparently has had one surgery but not ready to have another one. She did have an eye exam done earlier this year.She continues on Crestor and is having no side effects from this.Her marriage is going well. Social and family history as well as health maintenance and immunizations were reviewed.She does not complain of shortness of breath or GI complaints.   Review of Systems  All other systems reviewed and are negative.      Objective:   Physical Exam BP 130/70 mmHg  Pulse 90  Ht 5' 6.5" (1.689 m)  Wt 152 lb (68.947 kg)  BMI 24.17 kg/m2  SpO2 98%  General Appearance:    Alert, cooperative, no distress, appears stated age  Head:    Normocephalic, without obvious abnormality, atraumatic  Eyes:    PERRL, conjunctiva/corneas clear, EOM's     Ears:    Normal TM's and external ear canals  Nose:   Nares normal, mucosa normal, no drainage or sinus   tenderness  Throat:   Lips, mucosa, and tongue normal; teeth and gums normal  Neck:   Supple, no lymphadenopathy;  thyroid:  no   enlargement/tenderness/nodules; no carotid   bruit or JVD  Back:    Spine nontender, no curvature, ROM normal, no CVA     tenderness  Lungs:     Clear to  auscultation bilaterally without wheezes, rales or     ronchi; respirations unlabored  Chest Wall:    No tenderness or deformity   Heart:    Regular rate and rhythm, S1 and S2 normal, no murmur, rub   or gallop  Breast Exam:    Deferred to GYN  Abdomen:     Soft, non-tender, nondistended, normoactive bowel sounds,    no masses, no hepatosplenomegaly  Genitalia:    Deferred to GYN     Extremities:   No clubbing, cyanosis or edema.A relatively firm 2 cm lesion is noted near the radial styloid. Radial pulses normal.  Pulses:   2+ and symmetric all extremities  Skin:   Skin color, texture, turgor normal, no rashes or lesions  Lymph nodes:   Cervical, supraclavicular, and axillary nodes normal  Neurologic:   CNII-XII intact, normal strength, sensation and gait; reflexes 2+ and symmetric throughout          Psych:   Normal mood, affect, hygiene and grooming.          Assessment & Plan:  Ganglion - Plan: DG Wrist Complete Left  Light smoker  Type 2 diabetes mellitus with diabetic neuropathy - Plan: POCT glycosylated hemoglobin (Hb A1C), CBC with Differential/Platelet, Comprehensive metabolic panel, Lipid panel, POCT UA - Microalbumin, [employer] (ACCU-CHEK SOFTCLIX) lancets, glucose blood (ACCU-CHEK  AVIVA PLUS) test strip  Hypertension associated with diabetes - Plan: CBC with Differential/Platelet, Comprehensive metabolic panel, lisinopril-hydrochlorothiazide (PRINZIDE,ZESTORETIC) 20-12.5 MG per tablet, verapamil (CALAN-SR) 240 MG CR tablet  DM type 2 with diabetic dyslipidemia - Plan: Lipid panel, rosuvastatin (CRESTOR) 20 MG tablet  S/P recent Left carotid endarterectomy - Plan: clopidogrel (PLAVIX) 75 MG tablet  Cataract associated with type 2 diabetes mellitus  Diabetes mellitus due to underlying condition with diabetic neuropathy - Plan: sitaGLIPtin-metformin (JANUMET) 50-1000 MG per tablet  Dyslipidemia - Plan: rosuvastatin (CRESTOR) 20 MG tablet Information concerning  advanced directive given. Encouraged her to continue to take good care of herself in regard to diet and exercise. Will also order a Cologard.Over 45 minutes, greater than 50% spent in counseling and coordination of care.

## 2015-03-23 DIAGNOSIS — Z1211 Encounter for screening for malignant neoplasm of colon: Secondary | ICD-10-CM | POA: Diagnosis not present

## 2015-03-23 DIAGNOSIS — Z1212 Encounter for screening for malignant neoplasm of rectum: Secondary | ICD-10-CM | POA: Diagnosis not present

## 2015-04-01 ENCOUNTER — Encounter: Payer: Self-pay | Admitting: Gastroenterology

## 2015-04-01 ENCOUNTER — Other Ambulatory Visit: Payer: Self-pay

## 2015-04-01 ENCOUNTER — Encounter: Payer: Self-pay | Admitting: Family Medicine

## 2015-04-01 DIAGNOSIS — Z1211 Encounter for screening for malignant neoplasm of colon: Secondary | ICD-10-CM

## 2015-04-01 NOTE — Telephone Encounter (Signed)
CALLED PT INFORMED HER THAT THE EXACT SCIENCE COLOGARD CAME BACK POS. FOR PRESENCE OF COLORECTAL CANCER OR ADENOMA PER CANCER I EXPLAINED THAT I WOULD PUT REFERRAL IN EPIC AND SHE REALLY NEED TO HAVE COLONOSCOPY NOTHING TO PLAY AROUND WITH PT VERBALIZED UNDERSTANDING

## 2015-04-07 ENCOUNTER — Ambulatory Visit (INDEPENDENT_AMBULATORY_CARE_PROVIDER_SITE_OTHER): Payer: Medicare Other | Admitting: Neurology

## 2015-04-07 ENCOUNTER — Encounter: Payer: Self-pay | Admitting: Neurology

## 2015-04-07 VITALS — BP 110/70 | HR 75 | Ht 65.0 in | Wt 152.6 lb

## 2015-04-07 DIAGNOSIS — F17200 Nicotine dependence, unspecified, uncomplicated: Secondary | ICD-10-CM

## 2015-04-07 DIAGNOSIS — E785 Hyperlipidemia, unspecified: Secondary | ICD-10-CM | POA: Diagnosis not present

## 2015-04-07 DIAGNOSIS — E1140 Type 2 diabetes mellitus with diabetic neuropathy, unspecified: Secondary | ICD-10-CM

## 2015-04-07 DIAGNOSIS — I639 Cerebral infarction, unspecified: Secondary | ICD-10-CM

## 2015-04-07 DIAGNOSIS — Z9889 Other specified postprocedural states: Secondary | ICD-10-CM

## 2015-04-07 DIAGNOSIS — G451 Carotid artery syndrome (hemispheric): Secondary | ICD-10-CM | POA: Diagnosis not present

## 2015-04-07 DIAGNOSIS — Z720 Tobacco use: Secondary | ICD-10-CM

## 2015-04-07 DIAGNOSIS — I10 Essential (primary) hypertension: Secondary | ICD-10-CM | POA: Diagnosis not present

## 2015-04-07 NOTE — Progress Notes (Signed)
STROKE NEUROLOGY FOLLOW UP NOTE  NAME: [name] DOB: Dec 02, 1940  REASON FOR VISIT: stroke follow up HISTORY FROM: pt and chart  Today we had the pleasure of seeing [name] in follow-up at our [facility]rology [facility]. Pt was accompanied by daughter.   History Summary [name] is a 74 y.o. female who comes to the office for first hospital follow up post hospital discharge for stroke. She has a history of HTN, HLD, DM and TIA in May 2014, presenting 06/27/2013 with new onset speech difficulty. She was taking aspirin daily but ran out of aspirin one week prior. CT scan of her head today showed no intracranial abnormality. MRI study showed small left posterior frontal ischemic cerebral infarction. MRA was negative. LDL 66, HgbA1c 7.6. 2D Echo with EF 55% to 60%. No cardiac source of emboli was indentified. Carotid dopplers showed left side >80% stenosis, and she had left endarterectomy by Dr. Oneida Alar.   Follow up 09/09/13 (LL) - She was hospitalized with sepsis from C diff Jan 2015. She recovered well without serious neurological deficits. She has mild word-finding difficulties and dysarthria when tired. Yesterday she had episode of numbness in her left hand which spread to the left side of her face, which resolved within a half-hour. She was taken to ER by family, but discharged with diagnosis of TIA. Her aspirin was changed to Plavix which she has not filled yet.   Follow up 03/18/14 (LL): [name] comes to the office with her daughter for 6 month stroke followup. She states she is doing well and feels like she has fully recovered from stroke. Her blood pressure is well controlled, it is 129/72 in the office today. Her last Hgb a1c was 7.0, down from 7.8 previously. She has no recurrent symptoms of numbness, weakness or speech difficulties. She is tolerating Plavix well with no signs of significant bleeding or bruising. She has no new complaints today.  Follow up 09/20/14 - the  patient has been doing well. No recurrent stroke like symptoms. Her BP today 117/62. She said her BP and glucose are good at home. Her A1C in 06/2014 was 7.2, up from 7.0 in July. She continues to smoke, 6 cig per day. She is not ready to quit yet. She is on plavix and crestor. She is going to have CUS next Monday.   Interval History During the interval time, pt has been doing well. No recurrent symptoms. Follows with vascular surgery in 09/2014 and doing well. Next visit in 09/2015. She still not quit smoking yet and seems she is not willing to quit at this time. BP 110/70.   REVIEW OF SYSTEMS: Full 14 system review of systems performed and notable only for those listed below and in HPI above, all others are negative:  Constitutional:   Cardiovascular:  Ear/Nose/Throat:   Skin:  Eyes:   Respiratory:   Gastroitestinal:   Genitourinary:  Hematology/Lymphatic:   Endocrine:  Musculoskeletal:  Joint pain Allergy/Immunology:   Neurological:   Psychiatric:  Sleep:   The following represents the patient's updated allergies and side effects list: Allergies  Allergen Reactions  . Codeine Nausea And Vomiting  . Lipitor [Atorvastatin] Nausea And Vomiting  . Phenergan [Promethazine Hcl] Other (See Comments)    Confusion, hallucinations, severe aggitation    The neurologically relevant items on the patient's problem list were reviewed on today's visit.  Neurologic Examination  A problem focused neurological exam (12 or more points of the single system neurologic examination,  vital signs counts as 1 point, cranial nerves count for 8 points) was performed.  Blood pressure 110/70, pulse 75, height '5\' 5"'$  (1.651 m), weight 152 lb 9.6 oz (69.219 kg).  General - Well nourished, well developed, in no apparent distress.  Ophthalmologic - Sharp disc margins OU.  Cardiovascular - Regular rate and rhythm with no murmur.  Mental Status -  Level of arousal and orientation to time, place, and person  were intact. Language including expression, naming, repetition, comprehension was assessed and found intact.  Cranial Nerves II - XII - II - Visual field intact OU. III, IV, VI - Extraocular movements intact. V - Facial sensation intact bilaterally. VII - Facial movement intact bilaterally. VIII - Hearing & vestibular intact bilaterally. X - Palate elevates symmetrically. XI - Chin turning & shoulder shrug intact bilaterally. XII - Tongue protrusion intact.  Motor Strength - The patient's strength was normal in all extremities and pronator drift was absent.  Bulk was normal and fasciculations were absent.   Motor Tone - Muscle tone was assessed at the neck and appendages and was normal.  Reflexes - The patient's reflexes were normal in all extremities and she had no pathological reflexes.  Sensory - Light touch, temperature/pinprick were assessed and were normal.    Coordination - The patient had normal movements in the hands and feet with no ataxia or dysmetria.  Tremor was absent.  Gait and Station - The patient's transfers, posture, gait, station, and turns were observed as normal.  Data reviewed: I personally reviewed the images and agree with the radiology interpretations.  06/27/13 - MRI and MRA -  Acute infarct left posterior frontal lobe involving cortex and white matter. This measures approximately 10 x 20 mm. Negative intracranial MRA.  07/24/13 MRI No new infarction when compared to the study of December. Some persistent residual restricted diffusion in the area of infarction in the left posterior frontal region which was acute in December. Mild chronic small-vessel changes elsewhere affecting the cerebral hemispheric white matter.  CUS 02/04/14 - right ICA < 40% and left ICA patent post op  CUS 06/29/13 - The vertebral arteries appear patent with antegrade flow. - Findings consistent with1- 39 percent stenosis involving the right internal carotid artery. -  Findings consistent with greater than 80 percent stenosis involving the left internal carotid artery.  2D echo 07/27/13 - Left ventricle: The cavity size was normal. Wall thickness was increased in a pattern of mild LVH. Systolic function was normal. The estimated ejection fraction was in the range of 50% to 55%. Wall motion was normal; there were no regional wall motion abnormalities. Left ventricular diastolic function parameters were normal.  Component     Latest Ref Rng 11/17/2012 06/28/2013 09/29/2013 02/10/2014  Cholesterol     0 - 200 mg/dL 196 156    Triglycerides     <150 mg/dL 185 (H) 208 (H)    HDL     >39 mg/dL 49 48    Total CHOL/HDL Ratio      4.0 3.3    VLDL     0 - 40 mg/dL 37 42 (H)    LDL (calc)     0 - 99 mg/dL 110 (H) 66    Hemoglobin A1C        7.8 7.0   Component     Latest Ref Rng 06/29/2014  Cholesterol     0 - 200 mg/dL   Triglycerides     <150 mg/dL   HDL     >  39 mg/dL   Total CHOL/HDL Ratio        VLDL     0 - 40 mg/dL   LDL (calc)     0 - 99 mg/dL   Hemoglobin A1C      7.2    Assessment: As you may recall, she is a 74 y.o. Caucasian female with PMH of HTN, HLD, DM and TIA in May 2014, admitted on12/13/2014 for small left posterior frontal ischemic cerebral infarction showing on MRI. HgbA1c 7.6. Carotid dopplers showed left side >80% stenosis, and she had left endarterectomy by Dr. Oneida Alar.follow up CUS so far patent left ICA with no stenosis of right ICA. She had one TIA episode in 07/2013 with left hand face and arm weakness. MRI negative. Her ASA changed to plavix. Since then, she has no recurrent stroke symptoms. Her A1C improved over time and BP controlled well at home, however, she still not quit smoking yet. Follow up with vascular surgery regularly.  Plan:  - continue plavix and crestor for stroke prevention - check BP and glucose at home - Follow up with your primary care physician for stroke risk factor modification. Recommend  maintain blood pressure goal <130/80, diabetes with hemoglobin A1c goal below 6.5% and lipids with LDL cholesterol goal below 70 mg/dL.  - quit smoking - follow up with vascular surgery for carotid doppler monitoring - RTC PRN  No orders of the defined types were placed in this encounter.    No orders of the defined types were placed in this encounter.    Patient Instructions  - continue plavix and crestor for stroke prevention - check BP and glucose at home - Follow up with your primary care physician for stroke risk factor modification. Recommend maintain blood pressure goal <130/80, diabetes with hemoglobin A1c goal below 6.5% and lipids with LDL cholesterol goal below 70 mg/dL.  - quit smoking - follow up with vascular surgery for carotid doppler monitoring - follow up as needed.    Rosalin Hawking, MD PhD [facility] [address] [place], [place] [address] [phone]

## 2015-04-07 NOTE — Patient Instructions (Signed)
-   continue plavix and crestor for stroke prevention - check BP and glucose at home - Follow up with your primary care physician for stroke risk factor modification. Recommend maintain blood pressure goal <130/80, diabetes with hemoglobin A1c goal below 6.5% and lipids with LDL cholesterol goal below 70 mg/dL.  - quit smoking - follow up with vascular surgery for carotid doppler monitoring - follow up as needed.

## 2015-05-01 ENCOUNTER — Other Ambulatory Visit: Payer: Self-pay | Admitting: Family Medicine

## 2015-05-04 ENCOUNTER — Other Ambulatory Visit: Payer: Self-pay | Admitting: Family Medicine

## 2015-05-23 ENCOUNTER — Ambulatory Visit: Payer: Medicare Other | Admitting: Gastroenterology

## 2015-05-30 ENCOUNTER — Telehealth: Payer: Self-pay

## 2015-05-30 ENCOUNTER — Ambulatory Visit (INDEPENDENT_AMBULATORY_CARE_PROVIDER_SITE_OTHER): Payer: Medicare Other | Admitting: Physician Assistant

## 2015-05-30 ENCOUNTER — Encounter: Payer: Self-pay | Admitting: Physician Assistant

## 2015-05-30 VITALS — BP 136/60 | HR 88 | Ht 65.5 in | Wt 153.5 lb

## 2015-05-30 DIAGNOSIS — Z7901 Long term (current) use of anticoagulants: Secondary | ICD-10-CM

## 2015-05-30 DIAGNOSIS — Z1211 Encounter for screening for malignant neoplasm of colon: Secondary | ICD-10-CM | POA: Diagnosis not present

## 2015-05-30 DIAGNOSIS — R195 Other fecal abnormalities: Secondary | ICD-10-CM

## 2015-05-30 DIAGNOSIS — G451 Carotid artery syndrome (hemispheric): Secondary | ICD-10-CM

## 2015-05-30 MED ORDER — NA SULFATE-K SULFATE-MG SULF 17.5-3.13-1.6 GM/177ML PO SOLN: 1.0000 | Freq: Once | ORAL | Status: DC

## 2015-05-30 NOTE — Telephone Encounter (Signed)
  05/30/2015   RE: [name] DOB: [birth year] MRN: [id number]   Dear Dr Erlinda Hong,    We have scheduled the above patient for an endoscopic procedure. Our records show that she is on anticoagulation therapy.   Please advise as to how long the patient may come off her therapy of Plavix prior to the procedure, which is scheduled for 06-24-2015.  Please fax back/ or route the completed form to [facility] at [phone].   Sincerely,    Elias Else

## 2015-05-30 NOTE — Patient Instructions (Addendum)
You have been given a separate informational sheet regarding your tobacco use, the importance of quitting and local resources to help you quit.  You have been scheduled for a colonoscopy. Please follow written instructions given to you at your visit today.  Please pick up your prep supplies at the pharmacy within the next 1-3 days. If you use inhalers (even only as needed), please bring them with you on the day of your procedure. Your physician has requested that you go to www.startemmi.com and enter the access code given to you at your visit today. This web site gives a general overview about your procedure. However, you should still follow specific instructions given to you by our office regarding your preparation for the procedure.  We will contact you about your directions on PLAVIX.

## 2015-05-30 NOTE — Progress Notes (Signed)
Patient ID: [name], female   DOB: Nov 29, 1940, 74 y.o.   MRN: [id number]    HPI:  [name] is a 74 y.o.   female  referred by Nichole Lung, MD  For evaluation due to a positive cologard test.  She has a history of hypertension, hyperlipidemia, diabetes , and a TIA in May 2014. In December 2014 she had new onset speech difficulty. CT of her head showed no intracranial abnormality. MRI showed small left posterior frontal ischemic cerebral infarction. MRI was negative. She also has a history of sepsis from C. Difficile in January 2015. She has been maintained on Plavix and Crestor for stroke prevention by Dr. Erlinda Walker.  She follows with vascular surgery for carotid Doppler monitoring.    She was recently seen by her primary care physician for a routine checkup. She had never had a colonoscopy and so she completed a cologard  Test.  The test came back positive. She has not  Had a change in her bowel habits or stool caliber. She denies bloody or tarry stools. She denies anorexia or unexplained weight loss. She has a niece who had colon cancer but otherwise is not familiar with any one else in the family having colon cancer or inflammatory bowel disease.   Past Medical History  Diagnosis Date  . Neuropathy, diabetic ([place])   . Dyslipidemia     takes Crestor daily  . Smoker   . Hypertension     takes Prinzide and Verapamil daily  . Diabetes mellitus     takes Janumet daily  . PONV (postoperative nausea and vomiting)   . Impaired speech     from stroke  . Vertigo     but doesn't take any meds  . Stroke ([place]) 06/25/13  . Carotid artery occlusion   . Gallstones     Past Surgical History  Procedure Laterality Date  . Cholecystectomy    . Knee surgery Left 80yr ago  . Cataract removed Right   . Endarterectomy Left 07/14/2013    Procedure: ENDARTERECTOMY CAROTID-LEFT;  Surgeon: CElam Dutch MD;  [facility]  Service: Vascular;  Laterality: Left;  . Patch angioplasty Left  07/14/2013    Procedure: LEFT CAROTID ARTERY PATCH ANGIOPLASTY;  Surgeon: CElam Dutch MD;  [facility];  Service: Vascular;  Laterality: Left;   Family History  Problem Relation Age of Onset  . Leukemia Mother   . Cancer Father     esophageal ca  . Diabetes Mother   . Diabetes Maternal Aunt   . Diabetes Maternal Uncle   . Heart disease Father   . Diabetes Brother   . Diabetes Sister   . Heart attack Brother     x 2   Social History  Substance Use Topics  . Smoking status: Heavy Tobacco Smoker -- 0.50 packs/day    Types: Cigarettes  . Smokeless tobacco: Never Used  . Alcohol Use: No   Current Outpatient Prescriptions  Medication Sig Dispense Refill  . clopidogrel (PLAVIX) 75 MG tablet Take 1 tablet (75 mg total) by mouth daily. 90 tablet 3  . glucose blood (ACCU-CHEK AVIVA PLUS) test strip Test sugar once daily 100 each 3  . Lancet Devices (ACCU-CHEK SOFTCLIX) lancets Test blood sugar once daily 300 each 3  . lisinopril-hydrochlorothiazide (PRINZIDE,ZESTORETIC) 20-12.5 MG tablet take 1 tablet by mouth once daily 30 tablet 5  . rosuvastatin (CRESTOR) 20 MG tablet Take 1 tablet (20 mg total) by mouth daily. 90 tablet  3  . sitaGLIPtin-metformin (JANUMET) 50-1000 MG per tablet Take 1 tablet by mouth 2 (two) times daily with a meal. 180 tablet 1  . verapamil (CALAN-SR) 240 MG CR tablet take 1 tablet once daily 90 tablet 0  . Na Sulfate-K Sulfate-Mg Sulf SOLN Take 1 kit by mouth once. 354 mL 0   No current facility-administered medications for this visit.   Allergies  Allergen Reactions  . Codeine Nausea And Vomiting  . Lipitor [Atorvastatin] Nausea And Vomiting  . Phenergan [Promethazine Hcl] Other (See Comments)    Confusion, hallucinations, severe aggitation     Review of Systems: Gen: Denies any fever, chills, sweats, anorexia, fatigue, weakness, malaise, weight loss, and sleep disorder CV: Denies chest pain, angina, palpitations, syncope, orthopnea, PND,  peripheral edema, and claudication. Resp: Denies dyspnea at rest, dyspnea with exercise, cough, sputum, wheezing, coughing up blood, and pleurisy. GI: Denies vomiting blood, jaundice, and fecal incontinence.   Denies dysphagia or odynophagia. GU : Denies urinary burning, blood in urine, urinary frequency, urinary hesitancy, nocturnal urination, and urinary incontinence. MS: Denies joint pain, limitation of movement, and swelling, stiffness, low back pain, extremity pain. Denies muscle weakness, cramps, atrophy.  Derm: Denies rash, itching, dry skin, hives, moles, warts, or unhealing ulcers.  Psych: Denies depression, anxiety, memory loss, suicidal ideation, hallucinations, paranoia, and confusion. Heme: Denies bruising, bleeding, and enlarged lymph nodes. Neuro:  Denies any headaches, dizziness, paresthesias. Endo:  Denies any problems with DM, thyroid, adrenal function   LAB RESULTS:  Cologard test from 03/23/2015 was positive.    Physical Exam: BP 136/60 mmHg  Pulse 88  Ht 5' 5.5" (1.664 m)  Wt 153 lb 8 oz (69.627 kg)  BMI 25.15 kg/m2 Constitutional: Pleasant,well-developed, female in no acute distress. HEENT: Normocephalic and atraumatic. Conjunctivae are normal. No scleral icterus. Neck supple.  Cardiovascular: Normal rate, regular rhythm.  Pulmonary/chest: Effort normal and breath sounds normal. No wheezing, rales or rhonchi. Abdominal: Soft, nondistended, nontender. Bowel sounds active throughout. There are no masses palpable. No hepatomegaly. Extremities: no edema Lymphadenopathy: No cervical adenopathy noted. Neurological: Alert and oriented to person place and time. Skin: Skin is warm and dry. No rashes noted. Psychiatric: Normal mood and affect. Behavior is normal.  ASSESSMENT AND PLAN:  asymptomatic 74 year old female on chronic antiplatelet for  stroke prevention  Who recently had a positive cologard  Test,  Referred for evaluation. We spent a considerable amount of  time discussing colonoscopy with the patient and assessing whether or not she would be interested in having surgery or other treatments if a cancer were found. She states she is  Interested in pursuing colonoscopy because she has been feeling well.The risks, benefits, and alternatives to colonoscopy with possible biopsy and possible polypectomy were discussed with the patient and they consent to proceed. Hold plavix  5 days before procedure - will instruct when and how to resume after procedure. Risks and benefits of procedure including bleeding, perforation, infection, missed lesions, medication reactions and possible hospitalization or surgery if complications occur explained. Additional rare but real risk of cardiovascular event such as heart attack or ischemia/infarct of other organs off plavix explained and need to seek urgent help if this occurs. Will communicate by phone or EMR with patient's prescribing provider that to confirm holding plavix is reasonable in this case.     Cobie Leidner, Deloris Ping 05/30/2015, 4:48 PM  CC: Nichole Lung, MD  Addendum: Reviewed and agree with initial management. Jerene Bears, MD

## 2015-05-31 ENCOUNTER — Encounter: Payer: Self-pay | Admitting: Neurology

## 2015-05-31 NOTE — Telephone Encounter (Signed)
Hi, Katrina:  I have written a letter addressing the plavix issue and I put on your desk. Please fax over to the number below attention to [employer]. Thanks.  Rosalin Hawking, MD PhD Stroke Neurology 05/31/2015 5:38 PM

## 2015-06-01 NOTE — Telephone Encounter (Signed)
Please see letter in EPIC (epic letters tab) for directions from Dr Erlinda Hong to hold Plavix. Per Dr Hilarie Fredrickson "Would follow neurology rec. Begin ASA '81mg'$  while off plavix. Hold Plavix 5 days prior." Patient was contacted via phone. She states understanding. A copy of the letter from Dr Erlinda Hong was sent by mail for the pt.

## 2015-06-01 NOTE — Telephone Encounter (Signed)
Letter fax and sign by Dr. Erlinda Hong for clearance for endoscopic procedure. Fax confim

## 2015-06-20 ENCOUNTER — Other Ambulatory Visit: Payer: Self-pay | Admitting: Family Medicine

## 2015-06-20 ENCOUNTER — Telehealth: Payer: Self-pay

## 2015-06-20 MED ORDER — SITAGLIPTIN PHOS-METFORMIN HCL 50-1000 MG PO TABS: 1.0000 | ORAL_TABLET | Freq: Two times a day (BID) | ORAL | Status: DC

## 2015-06-20 NOTE — Telephone Encounter (Signed)
Pt called needing a refill on her Janumet 50-'1000mg'$  #180 sent to [facility] on [address]. She would like to have this today since she is completely out and hasn't taken any today.

## 2015-06-20 NOTE — Telephone Encounter (Signed)
Pt has appt for 07/05/15

## 2015-06-20 NOTE — Telephone Encounter (Signed)
Rx was sent to [facility].  Please call patient and have her set up diabetic follow up

## 2015-06-24 ENCOUNTER — Encounter: Payer: Self-pay | Admitting: Internal Medicine

## 2015-06-24 ENCOUNTER — Ambulatory Visit (AMBULATORY_SURGERY_CENTER): Payer: Medicare Other | Admitting: Internal Medicine

## 2015-06-24 VITALS — BP 109/54 | HR 76 | Temp 97.6°F | Resp 18 | Ht 65.0 in | Wt 153.0 lb

## 2015-06-24 DIAGNOSIS — D120 Benign neoplasm of cecum: Secondary | ICD-10-CM | POA: Diagnosis not present

## 2015-06-24 DIAGNOSIS — D490 Neoplasm of unspecified behavior of digestive system: Secondary | ICD-10-CM | POA: Diagnosis not present

## 2015-06-24 DIAGNOSIS — I10 Essential (primary) hypertension: Secondary | ICD-10-CM | POA: Diagnosis not present

## 2015-06-24 DIAGNOSIS — R195 Other fecal abnormalities: Secondary | ICD-10-CM

## 2015-06-24 DIAGNOSIS — K639 Disease of intestine, unspecified: Secondary | ICD-10-CM | POA: Diagnosis not present

## 2015-06-24 DIAGNOSIS — D122 Benign neoplasm of ascending colon: Secondary | ICD-10-CM

## 2015-06-24 DIAGNOSIS — D121 Benign neoplasm of appendix: Secondary | ICD-10-CM | POA: Diagnosis not present

## 2015-06-24 LAB — COLOGUARD: COLOGUARD: POSITIVE

## 2015-06-24 LAB — GLUCOSE, CAPILLARY
GLUCOSE-CAPILLARY: 157 mg/dL — AB (ref 65–99)
Glucose-Capillary: 186 mg/dL — ABNORMAL HIGH (ref 65–99)

## 2015-06-24 LAB — HM COLONOSCOPY

## 2015-06-24 MED ORDER — SODIUM CHLORIDE 0.9 % IV SOLN: 500.0000 mL | INTRAVENOUS | Status: DC

## 2015-06-24 NOTE — Progress Notes (Signed)
Called to room to assist during endoscopic procedure.  Patient ID and intended procedure confirmed with present staff. Received instructions for my participation in the procedure from the performing physician.  

## 2015-06-24 NOTE — Progress Notes (Signed)
Stable to RR 

## 2015-06-24 NOTE — Patient Instructions (Signed)
YOU HAD AN ENDOSCOPIC PROCEDURE TODAY AT [facility]:   Refer to the procedure report that was given to you for any specific questions about what was found during the examination.  If the procedure report does not answer your questions, please call your gastroenterologist to clarify.  If you requested that your care partner not be given the details of your procedure findings, then the procedure report has been included in a sealed envelope for you to review at your convenience later.  YOU SHOULD EXPECT: Some feelings of bloating in the abdomen. Passage of more gas than usual.  Walking can help get rid of the air that was put into your GI tract during the procedure and reduce the bloating. If you had a lower endoscopy (such as a colonoscopy or flexible sigmoidoscopy) you may notice spotting of blood in your stool or on the toilet paper. If you underwent a bowel prep for your procedure, you may not have a normal bowel movement for a few days.  Please Note:  You might notice some irritation and congestion in your nose or some drainage.  This is from the oxygen used during your procedure.  There is no need for concern and it should clear up in a day or so.  SYMPTOMS TO REPORT IMMEDIATELY:   Following lower endoscopy (colonoscopy or flexible sigmoidoscopy):  Excessive amounts of blood in the stool  Significant tenderness or worsening of abdominal pains  Swelling of the abdomen that is new, acute  Fever of 100F or higher  For urgent or emergent issues, a gastroenterologist can be reached at any hour by calling [phone].   DIET: Your first meal following the procedure should be a small meal and then it is ok to progress to your normal diet. Heavy or fried foods are harder to digest and may make you feel nauseous or bloated.  Likewise, meals heavy in dairy and vegetables can increase bloating.  Drink plenty of fluids but you should avoid alcoholic beverages for 24  hours.  ACTIVITY:  You should plan to take it easy for the rest of today and you should NOT DRIVE or use heavy machinery until tomorrow (because of the sedation medicines used during the test).    FOLLOW UP: Our staff will call the number listed on your records the next business day following your procedure to check on you and address any questions or concerns that you may have regarding the information given to you following your procedure. If we do not reach you, we will leave a message.  However, if you are feeling well and you are not experiencing any problems, there is no need to return our call.  We will assume that you have returned to your regular daily activities without incident.  If any biopsies were taken you will be contacted by phone or by letter within the next 1-3 weeks.  Please call us at [phone] if you have not heard about the biopsies in 3 weeks.    SIGNATURES/CONFIDENTIALITY: You and/or your care partner have signed paperwork which will be entered into your electronic medical record.  These signatures attest to the fact that that the information above on your After Visit Summary has been reviewed and is understood.  Full responsibility of the confidentiality of this discharge information lies with you and/or your care-partner.  Biopsy results pending. Resume Plavix tomorrow. Please review polyp, diverticulosis, and high fiber diet handouts provided.

## 2015-06-24 NOTE — Op Note (Signed)
[facility]  [address] [place], [id number]   COLONOSCOPY PROCEDURE REPORT  PATIENT: Nichole [name], [name]  MR#: [id number] BIRTHDATE: 1940/10/26 , 27  yrs. old GENDER: female ENDOSCOPIST: Jerene Bears, MD REFERRED MH:[employer], M.D. PROCEDURE DATE:  06/24/2015 PROCEDURE:   Colonoscopy, diagnostic, Colonoscopy with biopsy, Colonoscopy with snare polypectomy, and Submucosal injection, any substance First Screening Colonoscopy - Avg.  risk and is 50 yrs.  old or older - No.  Prior Negative Screening - Now for repeat screening. N/A  History of Adenoma - Now for follow-up colonoscopy & has been > or = to 3 yrs.  N/A  Polyps removed today? Yes ASA CLASS:   Class III INDICATIONS:positive Cologuard, first colonoscopy. MEDICATIONS: Monitored anesthesia care, Propofol 400 mg IV, and Lidocaine 40 mg IV  DESCRIPTION OF PROCEDURE:   After the risks benefits and alternatives of the procedure were thoroughly explained, informed consent was obtained.  The digital rectal exam revealed no rectal mass.   The LB [id number]  endoscope was introduced through the anus and advanced to the cecum, which was identified by both the appendix and ileocecal valve. No adverse events experienced. The quality of the prep was good.  (Suprep was used)  The instrument was then slowly withdrawn as the colon was fully examined. Estimated blood loss is zero unless otherwise noted in this procedure report.      COLON FINDINGS: 3 flat, subtle, cecal lesions found on close inspection.  The first was a flat, laterally spreading lesion, measuring 10-15 mm, which appeared to extend from the appendiceal orifice with very indistinct borders.  All lesions examined under white and narrow- band imaging.  This lesion was extensively biopsied and placed in jar one.  The second cecal lesion measured approximately 10-15 mm in the cecal base was also biopsied and placed in jar 2.  A third and laterally  spreading flat lesion was found in the cecum across from the IC valve.  This lesion measures approximately 25-30 mm.  Multiple biopsies were performed and placed in jar 3.  A submucosal tattoo was placed on 3 walls distal to the distal most cecal lesion using a 3 mL of [place] Ink.  Due to the flat nature of these polyps and very indistinct borders, complete endoscopic resection was not felt possible.  Small angiodysplastic lesion, nonbleeding, in the cecum.   A subepithelial lesion, measuring 40 mm in diameter, was found in the ascending colon. This lesion is felt to be most likely a lipoma with positive pillow sign. There was overlying erythema trauma related. Multiple bite on bite biopsies of the lesion were performed using cold forceps.   Two sessile polyps ranging between 5-68m in size were found in the ascending colon.  Polypectomies were performed with a cold snare.  The resection was complete, the polyp tissue was completely retrieved and sent to histology. There was moderate diverticulosis noted throughout the entire examined colon.  Retroflexed views revealed hypertrophied anal papillae. The time to cecum = 3.7 Withdrawal time = 30.8   The scope was withdrawn and the procedure completed.  COMPLICATIONS: There were no immediate complications.  ENDOSCOPIC IMPRESSION: 1.   3 flat cecal lesions as described above, all 3 lesions biopsied placed in separate jars. Submucosal tattoo placed 2.   Subepithelial lesion in the ascending colon; multiple biopsies of the lesion were performed 3.   Two sessile polyps ranging between 5-971min size were found in the ascending colon; polypectomies were performed with a  cold snare  4.   Moderate diverticulosis was noted throughout the entire examined colon 5.   Small nonbleeding cecal angiodysplasia  RECOMMENDATIONS: 1.  Await biopsy results 2.  Okay to resume Plavix tomorrow 3.  Further recommendations pending pathology report 4.   High-fiber diet  eSigned:  Jerene Bears, MD 06/24/2015 10:19 AM   cc: Jill Alexanders, MD and The Patient   PATIENT NAME:  Nichole [name], [name] MR#: [id number]

## 2015-06-27 ENCOUNTER — Telehealth: Payer: Self-pay | Admitting: *Deleted

## 2015-06-27 NOTE — Telephone Encounter (Signed)
  Follow up Call-  Call back number 06/24/2015  Post procedure Call Back phone  # [phone]  Permission to leave phone message Yes     Patient questions:  Do you have a fever, pain , or abdominal swelling? No. Pain Score  0 *  Have you tolerated food without any problems? Yes.    Have you been able to return to your normal activities? Yes.    Do you have any questions about your discharge instructions: Diet   No. Medications  No. Follow up visit  No.  Do you have questions or concerns about your Care? No.  Actions: * If pain score is 4 or above: No action needed, pain <4.

## 2015-07-05 ENCOUNTER — Encounter: Payer: Self-pay | Admitting: Family Medicine

## 2015-07-05 ENCOUNTER — Ambulatory Visit (INDEPENDENT_AMBULATORY_CARE_PROVIDER_SITE_OTHER): Payer: Medicare Other | Admitting: Family Medicine

## 2015-07-05 ENCOUNTER — Other Ambulatory Visit: Payer: Self-pay | Admitting: Family Medicine

## 2015-07-05 VITALS — BP 140/76 | HR 80 | Resp 14 | Wt 152.6 lb

## 2015-07-05 DIAGNOSIS — Z23 Encounter for immunization: Secondary | ICD-10-CM

## 2015-07-05 DIAGNOSIS — E1169 Type 2 diabetes mellitus with other specified complication: Secondary | ICD-10-CM

## 2015-07-05 DIAGNOSIS — I10 Essential (primary) hypertension: Secondary | ICD-10-CM | POA: Diagnosis not present

## 2015-07-05 DIAGNOSIS — E1159 Type 2 diabetes mellitus with other circulatory complications: Secondary | ICD-10-CM | POA: Diagnosis not present

## 2015-07-05 DIAGNOSIS — Z720 Tobacco use: Secondary | ICD-10-CM

## 2015-07-05 DIAGNOSIS — E785 Hyperlipidemia, unspecified: Secondary | ICD-10-CM

## 2015-07-05 DIAGNOSIS — E1140 Type 2 diabetes mellitus with diabetic neuropathy, unspecified: Secondary | ICD-10-CM | POA: Diagnosis not present

## 2015-07-05 DIAGNOSIS — Z86010 Personal history of colonic polyps: Secondary | ICD-10-CM

## 2015-07-05 DIAGNOSIS — F17210 Nicotine dependence, cigarettes, uncomplicated: Secondary | ICD-10-CM

## 2015-07-05 LAB — POCT GLYCOSYLATED HEMOGLOBIN (HGB A1C)

## 2015-07-05 MED ORDER — DAPAGLIFLOZIN PROPANEDIOL 5 MG PO TABS: 5.0000 mg | ORAL_TABLET | Freq: Every day | ORAL | Status: DC

## 2015-07-05 NOTE — Progress Notes (Signed)
   Subjective:    Patient ID: DESHAE DICKISON, female    DOB: February 25, 1941, 74 y.o.   MRN: [id number]  HPI  she is here for recheck on her diabetes. She does periodically check her blood sugars but does not remember readings. She keeps himself busy but is now involved in a regular exercise program. Her diet is unchanged. She continues to smoke. She does check her feet periodically and has had an eye exam within the last year. Her medications were reviewed and are listed in the chart. Family and social history was also reviewed  Well his health maintenance and immunizations. Recently she did undergo colonoscopy and several flat polyps were noted in the cecal area. Biopsies id show adenoma. She is now scheduled for surgical removal because the fact that there were flat and apparently the area that they were found and makes it difficult to do this through colonoscopy. She is concerned over this.   Review of Systems     Objective:   Physical Exam  alert and in no distress otherwise not examined. Hemoglobin A1c is 8.0.       Assessment & Plan:  Need for prophylactic vaccination and inoculation against influenza - Plan: Flu vaccine HIGH DOSE PF (Fluzone High dose)  DM type 2 with diabetic dyslipidemia ([place]) - Plan: HgB A1c  Hypertension associated with diabetes ([place])  Type 2 diabetes mellitus with diabetic neuropathy, without long-term current use of insulin ([place]) - Plan: dapagliflozin propanediol (FARXIGA) 5 MG TABS tablet  Light smoker  History of colonic polyps   encouraged her to quit smoking to help with the healing process. Wilder Glade was added to the regimen since her A1c has actually gone up. Encouraged her to become more physically active. She will continue on her present medications. Also counseled her on her surgery and explained that this is fairly simple procedure and she should do well and recovered nicely from this.

## 2015-07-12 ENCOUNTER — Encounter: Payer: Self-pay | Admitting: Family Medicine

## 2015-07-18 ENCOUNTER — Telehealth: Payer: Self-pay | Admitting: Family Medicine

## 2015-07-18 NOTE — Telephone Encounter (Signed)
P.A. Wilder Glade

## 2015-07-22 ENCOUNTER — Other Ambulatory Visit: Payer: Self-pay | Admitting: General Surgery

## 2015-07-22 DIAGNOSIS — D122 Benign neoplasm of ascending colon: Secondary | ICD-10-CM | POA: Diagnosis not present

## 2015-07-22 MED ORDER — CANAGLIFLOZIN 100 MG PO TABS: 100.0000 mg | ORAL_TABLET | Freq: Every day | ORAL | Status: DC

## 2015-07-22 NOTE — Telephone Encounter (Signed)
P.A. FARXIGA denied, pt needs trial both of the following Invokana, Jardiance.  Do you want to switch?

## 2015-07-22 NOTE — Telephone Encounter (Signed)
Sent in med to pharmacy for 4 months as pt has appt in april

## 2015-07-22 NOTE — Telephone Encounter (Signed)
Mickel Baas, i sent in med

## 2015-07-22 NOTE — Telephone Encounter (Signed)
Switch her to Invokana 100 mg daily and give her enough to cover her until her next visit

## 2015-07-22 NOTE — H&P (Signed)
History of Present Illness Nichole Ruff MD; 07/21/1094 12:20 PM) The patient is a 75 year old female who presents with a colonic polyp. 75 year old female who presents to the office after her recent colonoscopy showed several sessile polyps in the ascending colon. She received a colonoscopy due to a positive Cologard test. Colonoscopy was performed by Dr. Hilarie Fredrickson. Three flat, subtle cecal lesions were found. They were laterally spreading and approximately 10-15 mm with indistinct borders. The lesions were biopsied and shown to be tubular adenomas, with no high-grade dysplasia. There was a subepithelial lesion also found in the descending colon measuring approximately 4 cm. It was felt most likely to be a lipoma. She has a history of a CVA and required left carotid surgery in 2014. After the surgery, she developed severe C. difficile colitis requiring intensive care.   Problem List/Past Medical Nichole Lorenzo, LPN; 0/10/5407 81:19 PM) ADENOMATOUS POLYP OF ASCENDING COLON (D12.2)  Other Problems Nichole Lorenzo, LPN; 07/20/7827 56:21 PM) Cerebrovascular Accident Cholelithiasis Diabetes Mellitus General anesthesia - complications High blood pressure Hypercholesterolemia  Past Surgical History Nichole Lorenzo, LPN; 3/0/8657 84:69 PM) Carotid Artery Surgery Left. Cataract Surgery Right. Colon Polyp Removal - Colonoscopy Gallbladder Surgery - Laparoscopic Knee Surgery Left.  Diagnostic Studies History Nichole Lorenzo, LPN; 12/16/9526 41:32 PM) Colonoscopy within last year Mammogram >3 years ago Pap Smear >5 years ago  Allergies [name], CMA; 07/22/2015 11:42 AM) Vicodin *ANALGESICS - OPIOID* Codeine Phosphate *ANALGESICS - OPIOID* Lipitor *ANTIHYPERLIPIDEMICS* Phenergan *ANTIHISTAMINES*  Medication History Nichole Ruff, MD; 10/17/100 12:16 PM) Clopidogrel Bisulfate ('75MG'$  Tablet, Oral) Active. Crestor ('20MG'$  Tablet, Oral) Active. Janumet (50-'1000MG'$  Tablet,  Oral) Active. Lisinopril-Hydrochlorothiazide (20-12.'5MG'$  Tablet, Oral) Active. Verapamil HCl ER ('240MG'$  Tablet ER, Oral) Active. Medications Reconciled [name] ('500MG'$  Tablet, 2 (two) Tablet Oral SEE NOTE, Taken starting 07/22/2015) Active. (TAKE TWO TABLETS AT 2 PM, 3 PM, AND 10 PM THE DAY PRIOR TO SURGERY) [name] ('500MG'$  Tablet, 2 (two) Tablet Oral SEE NOTE, Taken starting 07/22/2015) Active. (Take at 2pm, 3pm, and 10pm the day prior to your colon operation)  Social History Nichole Lorenzo, LPN; 01/14/5365 44:03 PM) No alcohol use Tobacco use Current every day smoker.  Family History Nichole Lorenzo, LPN; 10/20/4257 56:38 PM) Cancer Father. Diabetes Mellitus Brother, Mother, Sister. Heart Disease Father. Hypertension Brother, Daughter, Mother, Sister. Migraine Headache Daughter.  Pregnancy / Birth History Nichole Lorenzo, LPN; [birth year] 29:51 PM) Age at menarche 42 years. Age of menopause 95-50 Gravida 3 Maternal age 103-20 Para 3 Regular periods     Review of Systems Nichole Lorenzo LPN; 02/20/4165 06:30 PM) General Not Present- Appetite Loss, Chills, Fatigue, Fever, Night Sweats, Weight Gain and Weight Loss. Skin Present- Dryness. Not Present- Change in Wart/Mole, Hives, Jaundice, New Lesions, Non-Healing Wounds, Rash and Ulcer. HEENT Not Present- Earache, Hearing Loss, Hoarseness, Nose Bleed, Oral Ulcers, Ringing in the Ears, Seasonal Allergies, Sinus Pain, Sore Throat, Visual Disturbances, Wears glasses/contact lenses and Yellow Eyes. Respiratory Present- Snoring. Not Present- Bloody sputum, Chronic Cough, Difficulty Breathing and Wheezing. Breast Not Present- Breast Mass, Breast Pain, Nipple Discharge and Skin Changes. Cardiovascular Not Present- Chest Pain, Difficulty Breathing Lying Down, Leg Cramps, Palpitations, Rapid Heart Rate, Shortness of Breath and Swelling of Extremities. Gastrointestinal Present- Hemorrhoids. Not Present- Abdominal Pain, Bloating,  Bloody Stool, Change in Bowel Habits, Chronic diarrhea, Constipation, Difficulty Swallowing, Excessive gas, Gets full quickly at meals, Indigestion, Nausea, Rectal Pain and Vomiting. Female Genitourinary Not Present- Frequency, Nocturia, Painful Urination, Pelvic Pain and Urgency. Musculoskeletal Not Present- Back Pain, Joint Pain, Joint Stiffness, Muscle  Pain, Muscle Weakness and Swelling of Extremities. Neurological Not Present- Decreased Memory, Fainting, Headaches, Numbness, Seizures, Tingling, Tremor, Trouble walking and Weakness. Psychiatric Not Present- Anxiety, Bipolar, Change in Sleep Pattern, Depression, Fearful and Frequent crying. Endocrine Not Present- Cold Intolerance, Excessive Hunger, Hair Changes, Heat Intolerance, Hot flashes and New Diabetes. Hematology Present- Easy Bruising. Not Present- Excessive bleeding, Gland problems, HIV and Persistent Infections.  Vitals ([name] CMA; 07/22/2015 11:41 AM) 07/22/2015 11:40 AM Weight: 149 lb Height: 63in Body Surface Area: 1.71 m Body Mass Index: 26.39 kg/m  Temp.: 12F(Temporal)  Pulse: 81 (Regular)  BP: 130/80 (Sitting, Left Arm, Standard)      Physical Exam Nichole Ruff MD; 02/13/174 12:17 PM)  General Mental Status-Alert. General Appearance-Not in acute distress. Build & Nutrition-Well nourished. Posture-Normal posture. Gait-Normal.  Head and Neck Head-normocephalic, atraumatic with no lesions or palpable masses. Trachea-midline.  Chest and Lung Exam Chest and lung exam reveals -on auscultation, normal breath sounds, no adventitious sounds and normal vocal resonance.  Cardiovascular Cardiovascular examination reveals -normal heart sounds, regular rate and rhythm with no murmurs.  Abdomen Inspection Inspection of the abdomen reveals - No Hernias. Skin - Scar - Note: umbilical scar noted. Palpation/Percussion Palpation and Percussion of the abdomen reveal - Soft, Non Tender, No  Rigidity (guarding), No hepatosplenomegaly and No Palpable abdominal masses.  Neurologic Neurologic evaluation reveals -alert and oriented x 3 with no impairment of recent or remote memory, normal attention span and ability to concentrate, normal sensation and normal coordination.  Musculoskeletal Normal Exam - Bilateral-Upper Extremity Strength Normal and Lower Extremity Strength Normal.    Assessment & Plan Nichole Ruff MD; 1/0/2585 1:13 PM)  ADENOMATOUS POLYP OF ASCENDING COLON (D12.2) Impression: 75 year old female with a history of stroke and TIA on Plavix who presents to the office with cecal polyps found on colonoscopy. This was tattooed. It was recommended that she have these removed via colon resection. I think this can be performed in a minimally invasive fashion. I will need to touch base with her neurologist to determine if she can safely come off of her Plavix. We also discussed her history of C. difficile colitis. She is at a higher risk than normal due to previous history. The surgery and anatomy were described to the patient as well as the risks of surgery and the possible complications. These include: Bleeding, deep abdominal infections and possible wound complications such as hernia and infection, damage to adjacent structures, leak of surgical connections, which can lead to other surgeries and possibly an ostomy, possible need for other procedures, such as abscess drains in radiology, possible prolonged hospital stay, possible diarrhea from removal of part of the colon, possible constipation from narcotics, possible bowel, bladder or sexual dysfunction if having rectal surgery, prolonged fatigue/weakness or appetite loss, possible early recurrence of of disease, possible complications of their medical problems such as heart disease or arrhythmias or lung problems, death (less than 1%). I believe the patient understands and wishes to proceed with the surgery.

## 2015-07-26 ENCOUNTER — Telehealth: Payer: Self-pay

## 2015-07-26 ENCOUNTER — Encounter: Payer: Self-pay | Admitting: Neurology

## 2015-07-26 NOTE — Telephone Encounter (Signed)
Rn call [facility] that a fax was sent from [place] stating pt needed clearance for surgery. Rn explain that the form stated cardiac clearance. Rn explain that we are a neurology office and we can give the neuro clearance. Abigail Butts stated there is a note stating to get neuro clearance. Rn explain Dr. Erlinda Hong did not prescribed the plavix but did write a letter for neurology clearance. Abigail Butts stated she will tell Claiborne Billings the LPN who fax the forms about neurology will fax over a letter.

## 2015-07-26 NOTE — Telephone Encounter (Signed)
Neurology clearance letter fax to [facility].

## 2015-08-04 ENCOUNTER — Other Ambulatory Visit: Payer: Self-pay | Admitting: *Deleted

## 2015-08-04 ENCOUNTER — Telehealth: Payer: Self-pay

## 2015-08-04 MED ORDER — VERAPAMIL HCL ER 240 MG PO TBCR: 240.0000 mg | EXTENDED_RELEASE_TABLET | Freq: Every day | ORAL | Status: DC

## 2015-08-04 NOTE — Telephone Encounter (Signed)
Pt called requesting a refill on her Verapamil '240mg'$ . She says she is completely out now and would like this refilled today. She uses the [facility] in [place] on [address].

## 2015-08-23 NOTE — Patient Instructions (Addendum)
AMATULLAH CHRISTY  08/23/2015   Your procedure is scheduled on: 09/02/2015    Report to [facility] Main  Entrance take [place]  elevators to 3rd floor to  [place] at     [place] AM.  Call this number if you have problems the morning of surgery [phone]   Remember: ONLY 1 PERSON MAY GO WITH YOU TO SHORT STAY TO GET  READY MORNING OF [place].  Do not eat food after midnight Wednesday night.  Then follow clear liquid diet Thursday morning until midnight Thursday night.  Then nothing by mouth.             Bowel prep as instructed by physician   CLEAR LIQUID DIET   Foods Allowed                                                                     Foods Excluded  Coffee and tea, regular and decaf                             liquids that you cannot  Plain Jell-O in any flavor                                             see through such as: Fruit ices (not with fruit pulp)                                     milk, soups, orange juice  Iced Popsicles                                    All solid food Carbonated beverages, regular and diet                                    Cranberry, grape and apple juices Sports drinks like Gatorade Lightly seasoned clear broth or consume(fat free) Sugar, honey syrup  Sample Menu Breakfast                                Lunch                                     Supper Cranberry juice                    Beef broth                            Chicken broth Jell-O  Grape juice                           Apple juice Coffee or tea                        Jell-O                                      Popsicle                                                Coffee or tea                        Coffee or tea  _____________________________________________________________________       Take these medicines the morning of surgery with A SIP OF WATER: Verapamil DO NOT TAKE ANY DIABETIC MEDICATIONS DAY  OF YOUR SURGERY                               You may not have any metal on your body including hair pins and              piercings  Do not wear jewelry, make-up, lotions, powders or perfumes, deodorant             Do not wear nail polish.  Do not shave  48 hours prior to surgery.                Do not bring valuables to the hospital. [facility].  Contacts, dentures or bridgework may not be worn into surgery.  Leave suitcase in the car. After surgery it may be brought to your room.       Special Instructions: coughing and deep breathing exercises, leg exercises               Please read over the following fact sheets you were given: _____________________________________________________________________             [facility] - Preparing for Surgery Before surgery, you can play an important role.  Because skin is not sterile, your skin needs to be as free of germs as possible.  You can reduce the number of germs on your skin by washing with CHG (chlorahexidine gluconate) soap before surgery.  CHG is an antiseptic cleaner which kills germs and bonds with the skin to continue killing germs even after washing. Please DO NOT use if you have an allergy to CHG or antibacterial soaps.  If your skin becomes reddened/irritated stop using the CHG and inform your nurse when you arrive at Short Stay. Do not shave (including legs and underarms) for at least 48 hours prior to the first CHG shower.  You may shave your face/neck. Please follow these instructions carefully:  1.  Shower with CHG Soap the night before surgery and the  morning of Surgery.  2.  If you choose to wash your hair, wash your hair first as usual with your  normal  shampoo.  3.  After you shampoo, rinse  your hair and body thoroughly to remove the  shampoo.                           4.  Use CHG as you would any other liquid soap.  You can apply chg directly  to the skin and wash                        Gently with a scrungie or clean washcloth.  5.  Apply the CHG Soap to your body ONLY FROM THE NECK DOWN.   Do not use on face/ open                           Wound or open sores. Avoid contact with eyes, ears mouth and genitals (private parts).                       Wash face,  Genitals (private parts) with your normal soap.             6.  Wash thoroughly, paying special attention to the area where your surgery  will be performed.  7.  Thoroughly rinse your body with warm water from the neck down.  8.  DO NOT shower/wash with your normal soap after using and rinsing off  the CHG Soap.                9.  Pat yourself dry with a clean towel.            10.  Wear clean pajamas.            11.  Place clean sheets on your bed the night of your first shower and do not  sleep with pets. Day of Surgery : Do not apply any lotions/deodorants the morning of surgery.  Please wear clean clothes to the hospital/surgery center.  FAILURE TO FOLLOW THESE INSTRUCTIONS MAY RESULT IN THE CANCELLATION OF YOUR SURGERY PATIENT SIGNATURE_________________________________  NURSE SIGNATURE__________________________________  ________________________________________________________________________  WHAT IS A BLOOD TRANSFUSION? Blood Transfusion Information  A transfusion is the replacement of blood or some of its parts. Blood is made up of multiple cells which provide different functions.  Red blood cells carry oxygen and are used for blood loss replacement.  White blood cells fight against infection.  Platelets control bleeding.  Plasma helps clot blood.  Other blood products are available for specialized needs, such as hemophilia or other clotting disorders. BEFORE THE TRANSFUSION  Who gives blood for transfusions?   Healthy volunteers who are fully evaluated to make sure their blood is safe. This is blood bank blood. Transfusion therapy is the safest it has ever been in the practice of  medicine. Before blood is taken from a donor, a complete history is taken to make sure that person has no history of diseases nor engages in risky social behavior (examples are intravenous drug use or sexual activity with multiple partners). The donor's travel history is screened to minimize risk of transmitting infections, such as malaria. The donated blood is tested for signs of infectious diseases, such as HIV and hepatitis. The blood is then tested to be sure it is compatible with you in order to minimize the chance of a transfusion reaction. If you or a relative donates blood, this is often done in anticipation of surgery and is not appropriate for emergency situations. It takes many  days to process the donated blood. RISKS AND COMPLICATIONS Although transfusion therapy is very safe and saves many lives, the main dangers of transfusion include:   Getting an infectious disease.  Developing a transfusion reaction. This is an allergic reaction to something in the blood you were given. Every precaution is taken to prevent this. The decision to have a blood transfusion has been considered carefully by your caregiver before blood is given. Blood is not given unless the benefits outweigh the risks. AFTER THE TRANSFUSION  Right after receiving a blood transfusion, you will usually feel much better and more energetic. This is especially true if your red blood cells have gotten low (anemic). The transfusion raises the level of the red blood cells which carry oxygen, and this usually causes an energy increase.  The nurse administering the transfusion will monitor you carefully for complications. HOME CARE INSTRUCTIONS  No special instructions are needed after a transfusion. You may find your energy is better. Speak with your caregiver about any limitations on activity for underlying diseases you may have. SEEK MEDICAL CARE IF:   Your condition is not improving after your transfusion.  You develop  redness or irritation at the intravenous (IV) site. SEEK IMMEDIATE MEDICAL CARE IF:  Any of the following symptoms occur over the next 12 hours:  Shaking chills.  You have a temperature by mouth above 102 F (38.9 C), not controlled by medicine.  Chest, back, or muscle pain.  People around you feel you are not acting correctly or are confused.  Shortness of breath or difficulty breathing.  Dizziness and fainting.  You get a rash or develop hives.  You have a decrease in urine output.  Your urine turns a dark color or changes to pink, red, or brown. Any of the following symptoms occur over the next 10 days:  You have a temperature by mouth above 102 F (38.9 C), not controlled by medicine.  Shortness of breath.  Weakness after normal activity.  The white part of the eye turns yellow (jaundice).  You have a decrease in the amount of urine or are urinating less often.  Your urine turns a dark color or changes to pink, red, or brown. Document Released: 06/29/2000 Document Revised: 09/24/2011 Document Reviewed: 02/16/2008 [facility] Patient Information 2014 [place], [place].  _______________________________________________________________________

## 2015-08-25 ENCOUNTER — Telehealth: Payer: Self-pay | Admitting: Family Medicine

## 2015-08-25 NOTE — Telephone Encounter (Signed)
Called pt & informed of medication change and to see if she had picked up medication and she states no, states getting ready to have surgery next week and didn't want to start any new meds until afterwards.  She seemed upset and didn't want to start a new med at this time & states not having any issues or side effects with [place] .  Samples given Farxiga '5mg'$ , ok per Beverlee Nims.  Pt was informed to let us know before she runs out & then would have to switch.   Called [place] went thru ins for $45 for 30 days

## 2015-08-26 ENCOUNTER — Encounter (HOSPITAL_COMMUNITY)
Admission: RE | Admit: 2015-08-26 | Discharge: 2015-08-26 | Disposition: A | Discharge status: Home / Self Care | Payer: Medicare Other | Source: Ambulatory Visit | Attending: General Surgery | Admitting: General Surgery

## 2015-08-26 ENCOUNTER — Encounter (HOSPITAL_COMMUNITY): Payer: Self-pay

## 2015-08-26 DIAGNOSIS — Z01818 Encounter for other preprocedural examination: Secondary | ICD-10-CM | POA: Diagnosis not present

## 2015-08-26 DIAGNOSIS — I4510 Unspecified right bundle-branch block: Secondary | ICD-10-CM | POA: Diagnosis not present

## 2015-08-26 DIAGNOSIS — Z0183 Encounter for blood typing: Secondary | ICD-10-CM | POA: Diagnosis not present

## 2015-08-26 DIAGNOSIS — Z01812 Encounter for preprocedural laboratory examination: Secondary | ICD-10-CM | POA: Diagnosis not present

## 2015-08-26 DIAGNOSIS — D120 Benign neoplasm of cecum: Secondary | ICD-10-CM | POA: Diagnosis not present

## 2015-08-26 HISTORY — DX: Cardiac murmur, unspecified: R01.1

## 2015-08-26 HISTORY — DX: Gastro-esophageal reflux disease without esophagitis: K21.9

## 2015-08-26 HISTORY — DX: Pneumonia, unspecified organism: J18.9

## 2015-08-26 LAB — CBC
HEMATOCRIT: 37.5 % (ref 36.0–46.0)
HEMOGLOBIN: 12.6 g/dL (ref 12.0–15.0)
MCH: 30.4 pg (ref 26.0–34.0)
MCHC: 33.6 g/dL (ref 30.0–36.0)
MCV: 90.6 fL (ref 78.0–100.0)
PLATELETS: 429 10*3/uL — AB (ref 150–400)
RBC: 4.14 MIL/uL (ref 3.87–5.11)
RDW: 13.9 % (ref 11.5–15.5)
WBC: 10.3 10*3/uL (ref 4.0–10.5)

## 2015-08-26 LAB — BASIC METABOLIC PANEL
ANION GAP: 13 (ref 5–15)
BUN: 21 mg/dL — ABNORMAL HIGH (ref 6–20)
CHLORIDE: 105 mmol/L (ref 101–111)
CO2: 23 mmol/L (ref 22–32)
Calcium: 9.6 mg/dL (ref 8.9–10.3)
Creatinine, Ser: 0.92 mg/dL (ref 0.44–1.00)
GFR calc Af Amer: >60 mL/min (ref >60)
GFR, EST NON AFRICAN AMERICAN: 60 mL/min — AB (ref >60)
GLUCOSE: 246 mg/dL — AB (ref 65–99)
POTASSIUM: 4.1 mmol/L (ref 3.5–5.1)
Sodium: 141 mmol/L (ref 135–145)

## 2015-08-26 NOTE — Progress Notes (Signed)
08-26-15 - BMP lab results from preop visit on 08-26-15 faxed to Dr. Marcello Moores via [facility]

## 2015-08-26 NOTE — Progress Notes (Signed)
07-05-15 - LOV - Dr. Redmond School (fam.med) - EPIC 07-05-15 - HgA1C (8.0) - EPIC 04-07-15 - LOV - Dr. Erlinda Hong (neoro) - EPIC  07-27-13 - Echo - EPIC

## 2015-08-27 LAB — ABO/RH: ABO/RH(D): O POS

## 2015-08-29 ENCOUNTER — Other Ambulatory Visit: Payer: Self-pay | Admitting: Family Medicine

## 2015-08-29 DIAGNOSIS — E1169 Type 2 diabetes mellitus with other specified complication: Secondary | ICD-10-CM

## 2015-08-29 DIAGNOSIS — E785 Hyperlipidemia, unspecified: Secondary | ICD-10-CM

## 2015-08-29 MED ORDER — SITAGLIPTIN PHOS-METFORMIN HCL 50-1000 MG PO TABS: 1.0000 | ORAL_TABLET | Freq: Two times a day (BID) | ORAL | Status: DC

## 2015-08-29 MED ORDER — ROSUVASTATIN CALCIUM 20 MG PO TABS: 20.0000 mg | ORAL_TABLET | Freq: Every day | ORAL | Status: DC

## 2015-09-01 NOTE — Anesthesia Preprocedure Evaluation (Addendum)
Anesthesia Evaluation  Patient identified by MRN, date of birth, ID band Patient awake    Reviewed: Allergy & Precautions, NPO status   History of Anesthesia Complications (+) PONV and history of anesthetic complications  Airway Mallampati: II  TM Distance: >3 FB Neck ROM: Full    Dental  (+) Dental Advisory Given,    Pulmonary Current Smoker,    breath sounds clear to auscultation       Cardiovascular hypertension, + Peripheral Vascular Disease  + Valvular Problems/Murmurs  Rhythm:Regular Rate:Normal     Neuro/Psych CVA negative psych ROS   GI/Hepatic GERD  ,  Endo/Other  diabetes, Type 2, Oral Hypoglycemic Agents  Renal/GU   negative genitourinary   Musculoskeletal negative musculoskeletal ROS (+)   Abdominal   Peds negative pediatric ROS (+)  Hematology negative hematology ROS (+)   Anesthesia Other Findings - HLD  Reproductive/Obstetrics negative OB ROS                            Lab Results  Component Value Date   WBC 10.3 08/26/2015   HGB 12.6 08/26/2015   HCT 37.5 08/26/2015   MCV 90.6 08/26/2015   PLT 429* 08/26/2015   Lab Results  Component Value Date   CREATININE 0.92 08/26/2015   BUN 21* 08/26/2015   NA 141 08/26/2015   K 4.1 08/26/2015   CL 105 08/26/2015   CO2 23 08/26/2015   Lab Results  Component Value Date   INR 0.90 09/08/2013   INR 0.96 07/25/2013   INR 1.01 07/03/2013   08/2015 EKG: normal sinus rhythm, RBBB.   Anesthesia Physical Anesthesia Plan  ASA: III  Anesthesia Plan: General   Post-op Pain Management:    Induction: Intravenous  Airway Management Planned: Oral ETT  Additional Equipment:   Intra-op Plan:   Post-operative Plan: Extubation in OR  Informed Consent: I have reviewed the patients History and Physical, chart, labs and discussed the procedure including the risks, benefits and alternatives for the proposed anesthesia with  the patient or authorized representative who has indicated his/her understanding and acceptance.     Plan Discussed with: CRNA  Anesthesia Plan Comments:         Anesthesia Quick Evaluation

## 2015-09-02 ENCOUNTER — Encounter (HOSPITAL_COMMUNITY): Payer: Self-pay | Admitting: *Deleted

## 2015-09-02 ENCOUNTER — Encounter (HOSPITAL_COMMUNITY)
Admission: RE | Disposition: A | Discharge status: Home / Self Care | Payer: Self-pay | Source: Ambulatory Visit | Attending: General Surgery

## 2015-09-02 ENCOUNTER — Inpatient Hospital Stay (HOSPITAL_COMMUNITY): Payer: Medicare Other | Admitting: Anesthesiology

## 2015-09-02 ENCOUNTER — Inpatient Hospital Stay (HOSPITAL_COMMUNITY)
Admission: RE | Admit: 2015-09-02 | Discharge: 2015-09-06 | DRG: 330 | Disposition: A | Discharge status: Home / Self Care | Payer: Medicare Other | Source: Ambulatory Visit | Attending: General Surgery | Admitting: General Surgery

## 2015-09-02 DIAGNOSIS — E1159 Type 2 diabetes mellitus with other circulatory complications: Secondary | ICD-10-CM | POA: Diagnosis present

## 2015-09-02 DIAGNOSIS — Z8619 Personal history of other infectious and parasitic diseases: Secondary | ICD-10-CM

## 2015-09-02 DIAGNOSIS — Z8673 Personal history of transient ischemic attack (TIA), and cerebral infarction without residual deficits: Secondary | ICD-10-CM | POA: Diagnosis not present

## 2015-09-02 DIAGNOSIS — D122 Benign neoplasm of ascending colon: Principal | ICD-10-CM | POA: Diagnosis present

## 2015-09-02 DIAGNOSIS — I10 Essential (primary) hypertension: Secondary | ICD-10-CM

## 2015-09-02 DIAGNOSIS — E1140 Type 2 diabetes mellitus with diabetic neuropathy, unspecified: Secondary | ICD-10-CM | POA: Diagnosis present

## 2015-09-02 DIAGNOSIS — D62 Acute posthemorrhagic anemia: Secondary | ICD-10-CM | POA: Diagnosis not present

## 2015-09-02 DIAGNOSIS — F17200 Nicotine dependence, unspecified, uncomplicated: Secondary | ICD-10-CM | POA: Diagnosis present

## 2015-09-02 DIAGNOSIS — K219 Gastro-esophageal reflux disease without esophagitis: Secondary | ICD-10-CM | POA: Diagnosis present

## 2015-09-02 DIAGNOSIS — I739 Peripheral vascular disease, unspecified: Secondary | ICD-10-CM | POA: Diagnosis not present

## 2015-09-02 DIAGNOSIS — Z833 Family history of diabetes mellitus: Secondary | ICD-10-CM

## 2015-09-02 DIAGNOSIS — I52 Other heart disorders in diseases classified elsewhere: Secondary | ICD-10-CM | POA: Diagnosis not present

## 2015-09-02 DIAGNOSIS — Z8249 Family history of ischemic heart disease and other diseases of the circulatory system: Secondary | ICD-10-CM | POA: Diagnosis not present

## 2015-09-02 DIAGNOSIS — R110 Nausea: Secondary | ICD-10-CM | POA: Diagnosis not present

## 2015-09-02 DIAGNOSIS — E1169 Type 2 diabetes mellitus with other specified complication: Secondary | ICD-10-CM | POA: Diagnosis not present

## 2015-09-02 DIAGNOSIS — K635 Polyp of colon: Secondary | ICD-10-CM | POA: Diagnosis not present

## 2015-09-02 DIAGNOSIS — Z79899 Other long term (current) drug therapy: Secondary | ICD-10-CM | POA: Diagnosis not present

## 2015-09-02 DIAGNOSIS — Z01812 Encounter for preprocedural laboratory examination: Secondary | ICD-10-CM | POA: Diagnosis not present

## 2015-09-02 DIAGNOSIS — D126 Benign neoplasm of colon, unspecified: Secondary | ICD-10-CM | POA: Diagnosis present

## 2015-09-02 DIAGNOSIS — E119 Type 2 diabetes mellitus without complications: Secondary | ICD-10-CM | POA: Diagnosis not present

## 2015-09-02 HISTORY — PX: LAPAROSCOPIC PARTIAL COLECTOMY: SHX5907

## 2015-09-02 LAB — GLUCOSE, CAPILLARY
GLUCOSE-CAPILLARY: 225 mg/dL — AB (ref 65–99)
Glucose-Capillary: 277 mg/dL — ABNORMAL HIGH (ref 65–99)
Glucose-Capillary: 277 mg/dL — ABNORMAL HIGH (ref 65–99)

## 2015-09-02 LAB — TYPE AND SCREEN
ABO/RH(D): O POS
Antibody Screen: NEGATIVE

## 2015-09-02 SURGERY — LAPAROSCOPIC PARTIAL COLECTOMY
Anesthesia: General | Site: Abdomen

## 2015-09-02 MED ORDER — PROPOFOL 10 MG/ML IV BOLUS
INTRAVENOUS | Status: AC
  Filled 2015-09-02: qty 20

## 2015-09-02 MED ORDER — LACTATED RINGERS IV SOLN
INTRAVENOUS | Status: DC
  Administered 2015-09-02 (×3): via INTRAVENOUS

## 2015-09-02 MED ORDER — SUCCINYLCHOLINE CHLORIDE 20 MG/ML IJ SOLN
INTRAMUSCULAR | Status: DC | PRN
  Administered 2015-09-02: 100 mg via INTRAVENOUS

## 2015-09-02 MED ORDER — DEXTROSE 5 % IV SOLN
2.0000 g | Freq: Two times a day (BID) | INTRAVENOUS | Status: AC
  Administered 2015-09-02: 2 g via INTRAVENOUS
  Filled 2015-09-02: qty 2

## 2015-09-02 MED ORDER — INSULIN ASPART 100 UNIT/ML ~~LOC~~ SOLN: 0.0000 [IU] | Freq: Three times a day (TID) | SUBCUTANEOUS | Status: DC

## 2015-09-02 MED ORDER — LIDOCAINE HCL (CARDIAC) 20 MG/ML IV SOLN
INTRAVENOUS | Status: AC
  Filled 2015-09-02: qty 5

## 2015-09-02 MED ORDER — ONDANSETRON HCL 4 MG/2ML IJ SOLN
INTRAMUSCULAR | Status: DC | PRN
  Administered 2015-09-02 (×2): 4 mg via INTRAVENOUS

## 2015-09-02 MED ORDER — PHENYLEPHRINE HCL 10 MG/ML IJ SOLN
INTRAMUSCULAR | Status: AC
  Filled 2015-09-02: qty 1

## 2015-09-02 MED ORDER — MEPERIDINE HCL 50 MG/ML IJ SOLN: 6.2500 mg | INTRAMUSCULAR | Status: DC | PRN

## 2015-09-02 MED ORDER — DIPHENHYDRAMINE HCL 50 MG/ML IJ SOLN
6.2500 mg | Freq: Once | INTRAMUSCULAR | Status: AC
  Administered 2015-09-02 (×2): 6.5 mg via INTRAVENOUS

## 2015-09-02 MED ORDER — HYDROMORPHONE HCL 1 MG/ML IJ SOLN
0.5000 mg | INTRAMUSCULAR | Status: DC | PRN
  Administered 2015-09-03 – 2015-09-05 (×4): 0.5 mg via INTRAVENOUS
  Filled 2015-09-02 (×4): qty 1

## 2015-09-02 MED ORDER — PROPOFOL 500 MG/50ML IV EMUL
INTRAVENOUS | Status: DC | PRN
Start: 2015-09-02 — End: 2015-09-02
  Administered 2015-09-02: 25 ug/kg/min via INTRAVENOUS

## 2015-09-02 MED ORDER — INSULIN ASPART 100 UNIT/ML ~~LOC~~ SOLN
0.0000 [IU] | Freq: Every day | SUBCUTANEOUS | Status: DC
  Administered 2015-09-02: 3 [IU] via SUBCUTANEOUS

## 2015-09-02 MED ORDER — ALUM & MAG HYDROXIDE-SIMETH 200-200-20 MG/5ML PO SUSP
30.0000 mL | Freq: Four times a day (QID) | ORAL | Status: DC | PRN
  Administered 2015-09-05: 30 mL via ORAL
  Filled 2015-09-02: qty 30

## 2015-09-02 MED ORDER — ALVIMOPAN 12 MG PO CAPS
12.0000 mg | ORAL_CAPSULE | Freq: Two times a day (BID) | ORAL | Status: DC
  Administered 2015-09-03: 12 mg via ORAL
  Filled 2015-09-02 (×4): qty 1

## 2015-09-02 MED ORDER — SODIUM CHLORIDE 0.9 % IV SOLN
10.0000 mg | INTRAVENOUS | Status: DC | PRN
  Administered 2015-09-02: 50 ug/min via INTRAVENOUS

## 2015-09-02 MED ORDER — SITAGLIPTIN PHOS-METFORMIN HCL 50-1000 MG PO TABS: 1.0000 | ORAL_TABLET | Freq: Two times a day (BID) | ORAL | Status: DC

## 2015-09-02 MED ORDER — HYDROMORPHONE HCL 1 MG/ML IJ SOLN
0.2500 mg | INTRAMUSCULAR | Status: DC | PRN
  Administered 2015-09-02 (×2): 0.25 mg via INTRAVENOUS

## 2015-09-02 MED ORDER — HYDROMORPHONE HCL 1 MG/ML IJ SOLN
INTRAMUSCULAR | Status: AC
  Filled 2015-09-02: qty 1

## 2015-09-02 MED ORDER — METFORMIN HCL 500 MG PO TABS
1000.0000 mg | ORAL_TABLET | Freq: Every day | ORAL | Status: DC
  Administered 2015-09-03 – 2015-09-06 (×4): 1000 mg via ORAL
  Filled 2015-09-02 (×6): qty 2

## 2015-09-02 MED ORDER — LIDOCAINE HCL (CARDIAC) 20 MG/ML IV SOLN
INTRAVENOUS | Status: DC | PRN
  Administered 2015-09-02: 100 mg via INTRAVENOUS

## 2015-09-02 MED ORDER — ROCURONIUM BROMIDE 100 MG/10ML IV SOLN
INTRAVENOUS | Status: DC | PRN
  Administered 2015-09-02: 40 mg via INTRAVENOUS

## 2015-09-02 MED ORDER — SUGAMMADEX SODIUM 200 MG/2ML IV SOLN
INTRAVENOUS | Status: AC
  Filled 2015-09-02: qty 2

## 2015-09-02 MED ORDER — DEXAMETHASONE SODIUM PHOSPHATE 10 MG/ML IJ SOLN
INTRAMUSCULAR | Status: DC | PRN
  Administered 2015-09-02: 10 mg via INTRAVENOUS

## 2015-09-02 MED ORDER — DIPHENHYDRAMINE HCL 50 MG/ML IJ SOLN
INTRAMUSCULAR | Status: AC
  Filled 2015-09-02: qty 1

## 2015-09-02 MED ORDER — GLYCOPYRROLATE 0.2 MG/ML IJ SOLN
INTRAMUSCULAR | Status: DC | PRN
  Administered 2015-09-02: 0.2 mg via INTRAVENOUS

## 2015-09-02 MED ORDER — DEXTROSE 5 % IV SOLN
2.0000 g | INTRAVENOUS | Status: AC
  Administered 2015-09-02: 2 g via INTRAVENOUS
  Filled 2015-09-02: qty 2

## 2015-09-02 MED ORDER — FENTANYL CITRATE (PF) 100 MCG/2ML IJ SOLN
INTRAMUSCULAR | Status: DC | PRN
  Administered 2015-09-02 (×5): 50 ug via INTRAVENOUS

## 2015-09-02 MED ORDER — ROCURONIUM BROMIDE 100 MG/10ML IV SOLN
INTRAVENOUS | Status: AC
  Filled 2015-09-02: qty 1

## 2015-09-02 MED ORDER — EPHEDRINE SULFATE 50 MG/ML IJ SOLN
INTRAMUSCULAR | Status: AC
  Filled 2015-09-02: qty 1

## 2015-09-02 MED ORDER — BUPIVACAINE-EPINEPHRINE 0.25% -1:200000 IJ SOLN
INTRAMUSCULAR | Status: DC | PRN
  Administered 2015-09-02: 30 mL

## 2015-09-02 MED ORDER — SODIUM CHLORIDE 0.9 % IJ SOLN
INTRAMUSCULAR | Status: AC
  Filled 2015-09-02: qty 10

## 2015-09-02 MED ORDER — HEPARIN SODIUM (PORCINE) 5000 UNIT/ML IJ SOLN
5000.0000 [IU] | Freq: Once | INTRAMUSCULAR | Status: AC
  Administered 2015-09-02: 5000 [IU] via SUBCUTANEOUS
  Filled 2015-09-02: qty 1

## 2015-09-02 MED ORDER — DEXAMETHASONE SODIUM PHOSPHATE 10 MG/ML IJ SOLN
INTRAMUSCULAR | Status: AC
  Filled 2015-09-02: qty 1

## 2015-09-02 MED ORDER — METFORMIN HCL 500 MG PO TABS
1000.0000 mg | ORAL_TABLET | Freq: Every day | ORAL | Status: DC
  Administered 2015-09-03 – 2015-09-05 (×3): 1000 mg via ORAL
  Filled 2015-09-02 (×5): qty 2

## 2015-09-02 MED ORDER — 0.9 % SODIUM CHLORIDE (POUR BTL) OPTIME
TOPICAL | Status: DC | PRN
  Administered 2015-09-02: 2000 mL

## 2015-09-02 MED ORDER — LINAGLIPTIN 5 MG PO TABS
5.0000 mg | ORAL_TABLET | Freq: Every day | ORAL | Status: DC
  Administered 2015-09-03 – 2015-09-06 (×4): 5 mg via ORAL
  Filled 2015-09-02 (×6): qty 1

## 2015-09-02 MED ORDER — KCL IN DEXTROSE-NACL 20-5-0.45 MEQ/L-%-% IV SOLN
INTRAVENOUS | Status: DC
  Administered 2015-09-02: 1000 mL via INTRAVENOUS
  Administered 2015-09-03 – 2015-09-05 (×6): via INTRAVENOUS
  Filled 2015-09-02 (×7): qty 1000

## 2015-09-02 MED ORDER — FENTANYL CITRATE (PF) 250 MCG/5ML IJ SOLN
INTRAMUSCULAR | Status: AC
  Filled 2015-09-02: qty 5

## 2015-09-02 MED ORDER — DIPHENHYDRAMINE HCL 50 MG/ML IJ SOLN: 12.5000 mg | Freq: Four times a day (QID) | INTRAMUSCULAR | Status: DC | PRN

## 2015-09-02 MED ORDER — ONDANSETRON HCL 4 MG PO TABS: 4.0000 mg | ORAL_TABLET | Freq: Four times a day (QID) | ORAL | Status: DC | PRN

## 2015-09-02 MED ORDER — VERAPAMIL HCL ER 240 MG PO TBCR
240.0000 mg | EXTENDED_RELEASE_TABLET | Freq: Every day | ORAL | Status: DC
  Administered 2015-09-02 – 2015-09-06 (×4): 240 mg via ORAL
  Filled 2015-09-02 (×5): qty 1

## 2015-09-02 MED ORDER — ONDANSETRON HCL 4 MG/2ML IJ SOLN
4.0000 mg | Freq: Four times a day (QID) | INTRAMUSCULAR | Status: DC | PRN
  Administered 2015-09-03 – 2015-09-05 (×4): 4 mg via INTRAVENOUS
  Filled 2015-09-02 (×5): qty 2

## 2015-09-02 MED ORDER — LACTATED RINGERS IV SOLN
INTRAVENOUS | Status: DC
  Administered 2015-09-02: 13:00:00 via INTRAVENOUS

## 2015-09-02 MED ORDER — EPHEDRINE SULFATE 50 MG/ML IJ SOLN
INTRAMUSCULAR | Status: DC | PRN
  Administered 2015-09-02 (×2): 10 mg via INTRAVENOUS

## 2015-09-02 MED ORDER — ALVIMOPAN 12 MG PO CAPS
12.0000 mg | ORAL_CAPSULE | Freq: Once | ORAL | Status: AC
  Administered 2015-09-02: 12 mg via ORAL
  Filled 2015-09-02: qty 1

## 2015-09-02 MED ORDER — SUGAMMADEX SODIUM 200 MG/2ML IV SOLN
INTRAVENOUS | Status: DC | PRN
  Administered 2015-09-02: 150 mg via INTRAVENOUS

## 2015-09-02 MED ORDER — PROPOFOL 10 MG/ML IV BOLUS
INTRAVENOUS | Status: DC | PRN
  Administered 2015-09-02: 150 mg via INTRAVENOUS

## 2015-09-02 MED ORDER — ONDANSETRON HCL 4 MG/2ML IJ SOLN
INTRAMUSCULAR | Status: AC
  Filled 2015-09-02: qty 2

## 2015-09-02 MED ORDER — ENOXAPARIN SODIUM 40 MG/0.4ML ~~LOC~~ SOLN
40.0000 mg | SUBCUTANEOUS | Status: DC
  Administered 2015-09-03 – 2015-09-06 (×4): 40 mg via SUBCUTANEOUS
  Filled 2015-09-02 (×6): qty 0.4

## 2015-09-02 MED ORDER — BUPIVACAINE-EPINEPHRINE (PF) 0.25% -1:200000 IJ SOLN
INTRAMUSCULAR | Status: AC
Start: 2015-09-02 — End: 2015-09-02
  Filled 2015-09-02: qty 30

## 2015-09-02 MED ORDER — ACETAMINOPHEN 500 MG PO TABS
1000.0000 mg | ORAL_TABLET | Freq: Four times a day (QID) | ORAL | Status: AC
  Administered 2015-09-02 – 2015-09-03 (×3): 1000 mg via ORAL
  Filled 2015-09-02 (×4): qty 2

## 2015-09-02 MED ORDER — LACTATED RINGERS IR SOLN
Status: DC | PRN
  Administered 2015-09-02: 1000 mL

## 2015-09-02 MED ORDER — ROSUVASTATIN CALCIUM 20 MG PO TABS
20.0000 mg | ORAL_TABLET | Freq: Every day | ORAL | Status: DC
  Administered 2015-09-02 – 2015-09-05 (×4): 20 mg via ORAL
  Filled 2015-09-02 (×6): qty 1

## 2015-09-02 MED ORDER — DIPHENHYDRAMINE HCL 12.5 MG/5ML PO ELIX: 12.5000 mg | ORAL_SOLUTION | Freq: Four times a day (QID) | ORAL | Status: DC | PRN

## 2015-09-02 MED ORDER — ONDANSETRON HCL 4 MG/2ML IJ SOLN
INTRAMUSCULAR | Status: AC
Start: 2015-09-02 — End: 2015-09-02
  Filled 2015-09-02: qty 2

## 2015-09-02 SURGICAL SUPPLY — 80 items
APPLIER CLIP 5 13 M/L LIGAMAX5 (MISCELLANEOUS)
APR CLP MED LRG 5 ANG JAW (MISCELLANEOUS)
BLADE EXTENDED COATED 6.5IN (ELECTRODE) IMPLANT
CABLE HIGH FREQUENCY MONO STRZ (ELECTRODE) IMPLANT
CELLS DAT CNTRL 66122 CELL SVR (MISCELLANEOUS) IMPLANT
CHLORAPREP W/TINT 26ML (MISCELLANEOUS) IMPLANT
CLIP APPLIE 5 13 M/L LIGAMAX5 (MISCELLANEOUS) IMPLANT
COVER MAYO STAND STRL (DRAPES) ×9 IMPLANT
COVER SURGICAL LIGHT HANDLE (MISCELLANEOUS) ×6 IMPLANT
DECANTER SPIKE VIAL GLASS SM (MISCELLANEOUS) ×1 IMPLANT
DRAIN CHANNEL 19F RND (DRAIN) IMPLANT
DRAPE LAPAROSCOPIC ABDOMINAL (DRAPES) ×3 IMPLANT
DRAPE SURG IRRIG POUCH 19X23 (DRAPES) ×1 IMPLANT
DRSG OPSITE POSTOP 4X10 (GAUZE/BANDAGES/DRESSINGS) IMPLANT
DRSG OPSITE POSTOP 4X6 (GAUZE/BANDAGES/DRESSINGS) ×2 IMPLANT
DRSG OPSITE POSTOP 4X8 (GAUZE/BANDAGES/DRESSINGS) IMPLANT
ELECT PENCIL ROCKER SW 15FT (MISCELLANEOUS) ×6 IMPLANT
ELECT REM PT RETURN 15FT ADLT (MISCELLANEOUS) ×3 IMPLANT
ENDOLOOP SUT PDS II  0 18 (SUTURE)
ENDOLOOP SUT PDS II 0 18 (SUTURE) IMPLANT
EVACUATOR SILICONE 100CC (DRAIN) IMPLANT
GAUZE SPONGE 4X4 12PLY STRL (GAUZE/BANDAGES/DRESSINGS) IMPLANT
GLOVE BIO SURGEON STRL SZ 6.5 (GLOVE) ×4 IMPLANT
GLOVE BIO SURGEONS STRL SZ 6.5 (GLOVE) ×2
GLOVE BIOGEL PI IND STRL 7.0 (GLOVE) ×2 IMPLANT
GLOVE BIOGEL PI INDICATOR 7.0 (GLOVE) ×4
GOWN STRL REUS W/TWL 2XL LVL3 (GOWN DISPOSABLE) ×6 IMPLANT
GOWN STRL REUS W/TWL XL LVL3 (GOWN DISPOSABLE) ×12 IMPLANT
HANDLE SUCTION POOLE (INSTRUMENTS) IMPLANT
LEGGING LITHOTOMY PAIR STRL (DRAPES) ×1 IMPLANT
LIGASURE IMPACT 36 18CM CVD LR (INSTRUMENTS) ×2 IMPLANT
LIQUID BAND (GAUZE/BANDAGES/DRESSINGS) ×2 IMPLANT
LUBRICANT JELLY K Y 4OZ (MISCELLANEOUS) ×3 IMPLANT
PACK COLON (CUSTOM PROCEDURE TRAY) ×3 IMPLANT
PAD POSITIONING PINK XL (MISCELLANEOUS) ×3 IMPLANT
PORT LAP GEL ALEXIS MED 5-9CM (MISCELLANEOUS) ×3 IMPLANT
RELOAD PROXIMATE 75MM BLUE (ENDOMECHANICALS) ×6 IMPLANT
RELOAD STAPLE 75 3.8 BLU REG (ENDOMECHANICALS) IMPLANT
RETRACTOR WND ALEXIS 18 MED (MISCELLANEOUS) IMPLANT
RTRCTR WOUND ALEXIS 18CM MED (MISCELLANEOUS)
SCISSORS LAP 5X35 DISP (ENDOMECHANICALS) ×3 IMPLANT
SEALER TISSUE G2 STRG ARTC 35C (ENDOMECHANICALS) IMPLANT
SET IRRIG TUBING LAPAROSCOPIC (IRRIGATION / IRRIGATOR) ×3 IMPLANT
SLEEVE XCEL OPT CAN 5 100 (ENDOMECHANICALS) ×3 IMPLANT
SPONGE LAP 18X18 X RAY DECT (DISPOSABLE) IMPLANT
STAPLER GUN LINEAR PROX 60 (STAPLE) ×4 IMPLANT
STAPLER PROXIMATE 75MM BLUE (STAPLE) ×4 IMPLANT
STAPLER VISISTAT 35W (STAPLE) ×2 IMPLANT
SUCTION POOLE HANDLE (INSTRUMENTS) ×3
SUT ETHILON 2 0 PS N (SUTURE) IMPLANT
SUT NOVA 1 T20/GS 25DT (SUTURE) ×6 IMPLANT
SUT NOVA NAB DX-16 0-1 5-0 T12 (SUTURE) IMPLANT
SUT PDS AB 1 CTX 36 (SUTURE) IMPLANT
SUT PDS AB 1 TP1 96 (SUTURE) IMPLANT
SUT PROLENE 2 0 KS (SUTURE) ×3 IMPLANT
SUT SILK 2 0 (SUTURE) ×3
SUT SILK 2 0 SH CR/8 (SUTURE) ×3 IMPLANT
SUT SILK 2-0 18XBRD TIE 12 (SUTURE) ×1 IMPLANT
SUT SILK 3 0 (SUTURE) ×3
SUT SILK 3 0 SH CR/8 (SUTURE) ×3 IMPLANT
SUT SILK 3-0 18XBRD TIE 12 (SUTURE) ×1 IMPLANT
SUT VIC AB 2-0 SH 18 (SUTURE) ×3 IMPLANT
SUT VIC AB 4-0 PS2 18 (SUTURE) ×6 IMPLANT
SUT VIC AB 4-0 PS2 27 (SUTURE) ×6 IMPLANT
SUT VICRYL 0 UR6 27IN ABS (SUTURE) ×1 IMPLANT
SUT VICRYL 2 0 18  UND BR (SUTURE)
SUT VICRYL 2 0 18 UND BR (SUTURE) IMPLANT
SYS LAPSCP GELPORT 120MM (MISCELLANEOUS)
SYSTEM LAPSCP GELPORT 120MM (MISCELLANEOUS) IMPLANT
TOWEL OR 17X26 10 PK STRL BLUE (TOWEL DISPOSABLE) IMPLANT
TOWEL OR NON WOVEN STRL DISP B (DISPOSABLE) IMPLANT
TRAY FOLEY W/METER SILVER 14FR (SET/KITS/TRAYS/PACK) ×3 IMPLANT
TRAY FOLEY W/METER SILVER 16FR (SET/KITS/TRAYS/PACK) ×1 IMPLANT
TROCAR BLADELESS OPT 5 100 (ENDOMECHANICALS) ×3 IMPLANT
TROCAR XCEL 12X100 BLDLESS (ENDOMECHANICALS) IMPLANT
TROCAR XCEL BLUNT TIP 100MML (ENDOMECHANICALS) IMPLANT
TROCAR XCEL NON-BLD 11X100MML (ENDOMECHANICALS) IMPLANT
TUBING CONNECTING 10 (TUBING) ×2 IMPLANT
TUBING CONNECTING 10' (TUBING) ×1
TUBING INSUF HEATED (TUBING) ×3 IMPLANT

## 2015-09-02 NOTE — Progress Notes (Signed)
09/02/15 1752: Attempted to dangle patient; she stated she "can't do it right now". Requests she give Tylenol "time to work" and refuses Dilaudid for pain. Education done regarding need to get pain relieved so she can dangle and use IS. Donne Hazel, RN

## 2015-09-02 NOTE — Anesthesia Procedure Notes (Addendum)
Procedure Name: Intubation Date/Time: 09/02/2015 7:36 AM Performed by: Maxwell Caul Pre-anesthesia Checklist: Patient identified, Emergency Drugs available, Suction available and Patient being monitored Patient Re-evaluated:Patient Re-evaluated prior to inductionOxygen Delivery Method: Circle System Utilized Preoxygenation: Pre-oxygenation with 100% oxygen Intubation Type: IV induction, Rapid sequence and Cricoid Pressure applied Laryngoscope Size: Mac and 4 Grade View: Grade I Tube type: Oral Tube size: 7.5 mm Number of attempts: 1 Airway Equipment and Method: Stylet Placement Confirmation: ETT inserted through vocal cords under direct vision,  positive ETCO2 and breath sounds checked- equal and bilateral Secured at: 21 cm Tube secured with: Tape Dental Injury: Teeth and Oropharynx as per pre-operative assessment

## 2015-09-02 NOTE — Op Note (Signed)
09/02/2015  9:50 AM  PATIENT:  [name]  75 y.o. female  Patient Care Team: [name], MD as PCP - General (Family Medicine)  PRE-OPERATIVE DIAGNOSIS:  colon polyps  POST-OPERATIVE DIAGNOSIS:  colon polyps  PROCEDURE:  LAPAROSCOPIC RIGHT COLECTOMY  SURGEON:  Surgeon(s): Leighton Ruff, MD  ASSISTANT: none   ANESTHESIA:   local and general  EBL: 88m Total I/O In: 2000 [I.V.:2000] Out: 425 [Urine:400; Blood:25]  SPECIMEN:  Source of Specimen:  R colon  DISPOSITION OF SPECIMEN:  PATHOLOGY  COUNTS:  YES  PLAN OF CARE: Admit to inpatient   PATIENT DISPOSITION:  PACU - hemodynamically stable.   INDICATIONS: This is a 75y.o. female who presented to my office with endoscopically unresectable colon polyps in her cecum. The risk and benefits and alternative treatments were explained to the patient prior to the OR and the patient has elected to proceed with partial colectomy.  Consent was signed and placed on chart prior to the OR.   OR FINDINGS: tattoo identified just distal to the cecum  DESCRIPTION:  The patient was identified & brought into the operating room. The patient was positioned supine with both arms tucked. SCDs were active during the entire case. The patient underwent general anesthesia without any difficulty. A foley catheter was inserted under sterile conditions. The abdomen was prepped and draped in a sterile fashion. A Surgical Timeout confirmed our plan.   I made a vertical incision above the umbilicus.  The subcutaneous tissue was divided with electrocautery. The fascia was then incised using electrocautery. The peritoneum was entered bluntly.  The omentum was freed from the abdominal wall.  An alexis wound protector was placed.  A port was placed through the capped alexis, and we induced carbon dioxide insufflation. Camera inspection revealed no injury. I placed additional ports under direct laparoscopic visualization.  I evaluated the entire abdomen  laparoscopically.  The liver appeared normal, the large and small bowel were normal as well.  There were no signs of metastatic disease.  I began by identifying the ileocolic artery and vein within the mesentery. Dissection was bluntly carried around these structures. The duodenum was identified and free from the structures. I then separated the structures bluntly and used the Enseal device to transect these separately.  I developed the retroperitoneal plane bluntly.  I then freed the appendix off its attachments to the pelvic wall. I mobilized the terminal ileum.  I took care to avoid injuring any retroperitoneal structures.  After this I began to mobilize laterally down the white line of Toldt and then took down the hepatic flexure using the Enseal device. I mobilized the omentum off of the right transverse colon. The entire colon was then flipped medially and mobilized off of the retroperitoneal structures until I could visualize the lateral edge of the duodenum underneath.  I gently freed the duodenal attachments.  The abdomen was desufflated and the cap was removed.  The terminal ileum and right colon were then removed from the wound. The terminal ileum was transected using a GIA blue load stapler. The remaining mesentery was divided using the Enseal device. I identified a portion of the transverse colon just distal to the hepatic flexure. This was transected using another blue load GIA stapler.  An anastomosis was created between the terminal ileum and the transverse colon. This was done using a GIA blue load stapler.  The common enterotomy channel was closed using a TA 60 blue load stapler. Hemostasis was good at the  staple line. Several 3-0 silk sutures were used to imbricate the edge of the anastomosis. An anti-tension suture was placed in the crotch of the anastomosis. This was then placed back into the abdomen. The abdomen was then irrigated with normal saline.  Hemostasis was good.  The omentum was  then brought down over the anastomosis. The Alexis wound protector was removed, and we switched to clean instruments, gowns and drapes.  The fascia was then closed using #0 Novafil interrupted sutures. The subcutaneous tissue of the incision was closed using interrupted 2-0 Vicryl sutures. The skin was then closed using 4-0 Vicryl sutures. Dermabond was placed on the port sites and a sterile dressing was placed over the abdominal incision. All counts were correct per operating room staff. The patient was then awakened from anesthesia and sent to the post anesthesia care unit in stable condition.

## 2015-09-02 NOTE — Transfer of Care (Signed)
Immediate Anesthesia Transfer of Care Note  Patient: [name]  Procedure(s) Performed: Procedure(s): LAPAROSCOPIC PARTIAL RIGHT COLECTOMY (N/A)  Patient Location: PACU  Anesthesia Type:General  Level of Consciousness:  sedated, patient cooperative and responds to stimulation  Airway & Oxygen Therapy:Patient Spontanous Breathing and Patient connected to face mask oxgen  Post-op Assessment:  Report given to PACU RN and Post -op Vital signs reviewed and stable  Post vital signs:  Reviewed and stable  Last Vitals:  Filed Vitals:   09/02/15 0528  BP: 131/59  Pulse: 75  Temp: 36.5 C  Resp: 18    Complications: No apparent anesthesia complications

## 2015-09-02 NOTE — Anesthesia Postprocedure Evaluation (Signed)
Anesthesia Post Note  Patient: [name]  Procedure(s) Performed: Procedure(s) (LRB): LAPAROSCOPIC PARTIAL RIGHT COLECTOMY (N/A)  Patient location during evaluation: PACU Anesthesia Type: General Level of consciousness: awake and alert Pain management: pain level controlled Vital Signs Assessment: post-procedure vital signs reviewed and stable Respiratory status: spontaneous breathing, nonlabored ventilation, respiratory function stable and patient connected to nasal cannula oxygen Cardiovascular status: blood pressure returned to baseline and stable Postop Assessment: no signs of nausea or vomiting Anesthetic complications: no    Last Vitals:  Filed Vitals:   09/02/15 1100 09/02/15 1115  BP: 100/45 105/48  Pulse: 82 86  Temp:  36.4 C  Resp: 21 21    Last Pain:  Filed Vitals:   09/02/15 1122  PainSc: [place] Rajinder Mesick

## 2015-09-02 NOTE — H&P (Signed)
The patient is a 75 year old female who presents with a colonic polyp. 75 year old female who presents to the office after her recent colonoscopy showed several sessile polyps in the ascending colon. She received a colonoscopy due to a positive Cologard test. Colonoscopy was performed by Dr. Hilarie Fredrickson. Three flat, subtle cecal lesions were found. They were laterally spreading and approximately 10-15 mm with indistinct borders. The lesions were biopsied and shown to be tubular adenomas, with no high-grade dysplasia. There was a subepithelial lesion also found in the descending colon measuring approximately 4 cm. It was felt most likely to be a lipoma. She has a history of a CVA and required left carotid surgery in 2014. After the surgery, she developed severe C. difficile colitis requiring intensive care.   Problem List/Past Medical [name], LPN; 07/21/1094 04:54 PM) ADENOMATOUS POLYP OF ASCENDING COLON (D12.2)  Other Problems [name], LPN; 0/03/8118 14:78 PM) Cerebrovascular Accident Cholelithiasis Diabetes Mellitus General anesthesia - complications High blood pressure Hypercholesterolemia  Past Surgical History [name], LPN; 08/25/5619 30:86 PM) Carotid Artery Surgery Left. Cataract Surgery Right. Colon Polyp Removal - Colonoscopy Gallbladder Surgery - Laparoscopic Knee Surgery Left.  Diagnostic Studies History [name], LPN; 11/20/8467 62:95 PM) Colonoscopy within last year Mammogram >3 years ago Pap Smear >5 years ago  Allergies Marjean Donna, CMA; 07/22/2015 11:42 AM) Vicodin *ANALGESICS - OPIOID* Codeine Phosphate *ANALGESICS - OPIOID* Lipitor *ANTIHYPERLIPIDEMICS* Phenergan *ANTIHISTAMINES*  Medication History Leighton Ruff, MD; 08/23/4130 12:16 PM) Clopidogrel Bisulfate ('75MG'$  Tablet, Oral) Active. Crestor ('20MG'$  Tablet, Oral) Active. Janumet (50-'1000MG'$  Tablet, Oral) Active. Lisinopril-Hydrochlorothiazide (20-12.'5MG'$  Tablet,  Oral) Active. Verapamil HCl ER ('240MG'$  Tablet ER, Oral) Active. Medications Reconciled Neomycin Sulfate ('500MG'$  Tablet, 2 (two) Tablet Oral SEE NOTE, Taken starting 07/22/2015) Active. (TAKE TWO TABLETS AT 2 PM, 3 PM, AND 10 PM THE DAY PRIOR TO SURGERY) Flagyl ('500MG'$  Tablet, 2 (two) Tablet Oral SEE NOTE, Taken starting 07/22/2015) Active. (Take at 2pm, 3pm, and 10pm the day prior to your colon operation)  Social History [name], LPN; 10/17/100 72:53 PM) No alcohol use Tobacco use Current every day smoker.  Family History [name], LPN; 12/19/4401 47:42 PM) Cancer Father. Diabetes Mellitus Brother, Mother, Sister. Heart Disease Father. Hypertension Brother, Daughter, Mother, Sister. Migraine Headache Daughter.  Pregnancy / Birth History [name], LPN; 11/21/5636 75:64 PM) Age at menarche 31 years. Age of menopause 28-50 Gravida 3 Maternal age 28-20 Para 3 Regular periods     Review of Systems General Not Present- Appetite Loss, Chills, Fatigue, Fever, Night Sweats, Weight Gain and Weight Loss. Skin Present- Dryness. Not Present- Change in Wart/Mole, Hives, Jaundice, New Lesions, Non-Healing Wounds, Rash and Ulcer. HEENT Not Present- Earache, Hearing Loss, Hoarseness, Nose Bleed, Oral Ulcers, Ringing in the Ears, Seasonal Allergies, Sinus Pain, Sore Throat, Visual Disturbances, Wears glasses/contact lenses and Yellow Eyes. Respiratory Present- Snoring. Not Present- Bloody sputum, Chronic Cough, Difficulty Breathing and Wheezing. Breast Not Present- Breast Mass, Breast Pain, Nipple Discharge and Skin Changes. Cardiovascular Not Present- Chest Pain, Difficulty Breathing Lying Down, Leg Cramps, Palpitations, Rapid Heart Rate, Shortness of Breath and Swelling of Extremities. Gastrointestinal Present- Hemorrhoids. Not Present- Abdominal Pain, Bloating, Bloody Stool, Change in Bowel Habits, Chronic diarrhea, Constipation, Difficulty Swallowing, Excessive gas,  Gets full quickly at meals, Indigestion, Nausea, Rectal Pain and Vomiting. Female Genitourinary Not Present- Frequency, Nocturia, Painful Urination, Pelvic Pain and Urgency. Musculoskeletal Not Present- Back Pain, Joint Pain, Joint Stiffness, Muscle Pain, Muscle Weakness and Swelling of Extremities. Neurological Not Present- Decreased Memory, Fainting, Headaches, Numbness, Seizures,  Tingling, Tremor, Trouble walking and Weakness. Psychiatric Not Present- Anxiety, Bipolar, Change in Sleep Pattern, Depression, Fearful and Frequent crying. Endocrine Not Present- Cold Intolerance, Excessive Hunger, Hair Changes, Heat Intolerance, Hot flashes and New Diabetes. Hematology Present- Easy Bruising. Not Present- Excessive bleeding, Gland problems, HIV and Persistent Infections.  BP 131/59 mmHg  Pulse 75  Temp(Src) 97.7 F (36.5 C) (Oral)  Resp 18  Ht '5\' 6"'$  (1.676 m)  Wt 68.04 kg (150 lb)  BMI 24.22 kg/m2  SpO2 98%    Physical Exam   General Mental Status-Alert. General Appearance-Not in acute distress. Build & Nutrition-Well nourished. Posture-Normal posture. Gait-Normal.  Head and Neck Head-normocephalic, atraumatic with no lesions or palpable masses. Trachea-midline.  Chest and Lung Exam Chest and lung exam reveals -on auscultation, normal breath sounds, no adventitious sounds and normal vocal resonance.  Cardiovascular Cardiovascular examination reveals -normal heart sounds, regular rate and rhythm with no murmurs.  Abdomen Inspection Inspection of the abdomen reveals - No Hernias. Skin - Scar - Note: umbilical scar noted. Palpation/Percussion Palpation and Percussion of the abdomen reveal - Soft, Non Tender, No Rigidity (guarding), No hepatosplenomegaly and No Palpable abdominal masses.  Neurologic Neurologic evaluation reveals -alert and oriented x 3 with no impairment of recent or remote memory, normal attention span and ability to concentrate, normal  sensation and normal coordination.  Musculoskeletal Normal Exam - Bilateral-Upper Extremity Strength Normal and Lower Extremity Strength Normal.    Assessment & Plan Leighton Ruff MD; 07/19/863 1:13 PM)  ADENOMATOUS POLYP OF ASCENDING COLON (D12.2) Impression: 75 year old female with a history of stroke and TIA on Plavix who presents to the office with cecal polyps found on colonoscopy. This was tattooed. It was recommended that she have these removed via colon resection. I think this can be performed in a minimally invasive fashion. I will need to touch base with her neurologist to determine if she can safely come off of her Plavix. We also discussed her history of C. difficile colitis. She is at a higher risk than normal due to previous history. The surgery and anatomy were described to the patient as well as the risks of surgery and the possible complications. These include: Bleeding, deep abdominal infections and possible wound complications such as hernia and infection, damage to adjacent structures, leak of surgical connections, which can lead to other surgeries and possibly an ostomy, possible need for other procedures, such as abscess drains in radiology, possible prolonged hospital stay, possible diarrhea from removal of part of the colon, possible constipation from narcotics, possible bowel, bladder or sexual dysfunction if having rectal surgery, prolonged fatigue/weakness or appetite loss, possible early recurrence of of disease, possible complications of their medical problems such as heart disease or arrhythmias or lung problems, death (less than 1%). I believe the patient understands and wishes to proceed with the surgery.

## 2015-09-03 LAB — CBC
HCT: 32.5 % — ABNORMAL LOW (ref 36.0–46.0)
Hemoglobin: 10.7 g/dL — ABNORMAL LOW (ref 12.0–15.0)
MCH: 30.7 pg (ref 26.0–34.0)
MCHC: 32.9 g/dL (ref 30.0–36.0)
MCV: 93.1 fL (ref 78.0–100.0)
PLATELETS: 366 10*3/uL (ref 150–400)
RBC: 3.49 MIL/uL — AB (ref 3.87–5.11)
RDW: 14.1 % (ref 11.5–15.5)
WBC: 32.1 10*3/uL — AB (ref 4.0–10.5)

## 2015-09-03 LAB — BASIC METABOLIC PANEL
Anion gap: 7 (ref 5–15)
BUN: 13 mg/dL (ref 6–20)
CALCIUM: 8.2 mg/dL — AB (ref 8.9–10.3)
CHLORIDE: 104 mmol/L (ref 101–111)
CO2: 25 mmol/L (ref 22–32)
CREATININE: 0.94 mg/dL (ref 0.44–1.00)
GFR calc non Af Amer: 58 mL/min — ABNORMAL LOW (ref >60)
Glucose, Bld: 205 mg/dL — ABNORMAL HIGH (ref 65–99)
Potassium: 4 mmol/L (ref 3.5–5.1)
SODIUM: 136 mmol/L (ref 135–145)

## 2015-09-03 LAB — GLUCOSE, CAPILLARY
GLUCOSE-CAPILLARY: 156 mg/dL — AB (ref 65–99)
GLUCOSE-CAPILLARY: 207 mg/dL — AB (ref 65–99)
GLUCOSE-CAPILLARY: 232 mg/dL — AB (ref 65–99)
GLUCOSE-CAPILLARY: 99 mg/dL (ref 65–99)
Glucose-Capillary: 126 mg/dL — ABNORMAL HIGH (ref 65–99)
Glucose-Capillary: 157 mg/dL — ABNORMAL HIGH (ref 65–99)
Glucose-Capillary: 215 mg/dL — ABNORMAL HIGH (ref 65–99)

## 2015-09-03 MED ORDER — MENTHOL 3 MG MT LOZG
1.0000 | LOZENGE | OROMUCOSAL | Status: DC | PRN
  Filled 2015-09-03: qty 9

## 2015-09-03 MED ORDER — PHENOL 1.4 % MT LIQD
1.0000 | OROMUCOSAL | Status: DC | PRN
  Filled 2015-09-03: qty 177

## 2015-09-03 MED ORDER — INSULIN ASPART 100 UNIT/ML ~~LOC~~ SOLN
0.0000 [IU] | SUBCUTANEOUS | Status: DC
  Administered 2015-09-03: 7 [IU] via SUBCUTANEOUS
  Administered 2015-09-03 (×2): 4 [IU] via SUBCUTANEOUS
  Administered 2015-09-03 – 2015-09-04 (×3): 3 [IU] via SUBCUTANEOUS

## 2015-09-03 NOTE — Progress Notes (Signed)
1 Day Post-Op  Subjective: Some nausea. Fair pain control.  Objective: Vital signs in last 24 hours: Temp:  [97.2 F (36.2 C)-98.4 F (36.9 C)] 98.4 F (36.9 C) (02/18 0615) Pulse Rate:  [78-108] 78 (02/18 0615) Resp:  [14-22] 16 (02/18 0615) BP: (96-159)/(45-84) 103/47 mmHg (02/18 0615) SpO2:  [95 %-100 %] 100 % (02/18 0615)    Intake/Output from previous day: 02/17 0701 - 02/18 0700 In: 4196.3 [I.V.:4146.3; IV Piggyback:50] Out: 1525 [Urine:1500; Blood:25] Intake/Output this shift:    PE: General- In NAD Abdomen-soft, incisions clean and intact, no bowel sounds  Lab Results:   Recent Labs  09/03/15 0453  WBC 32.1*  HGB 10.7*  HCT 32.5*  PLT 366   BMET  Recent Labs  09/03/15 0453  NA 136  K 4.0  CL 104  CO2 25  GLUCOSE 205*  BUN 13  CREATININE 0.94  CALCIUM 8.2*   PT/INR No results for input(s): LABPROT, INR in the last 72 hours. Comprehensive Metabolic Panel:    Component Value Date/Time   NA 136 09/03/2015 0453   NA 141 08/26/2015 0945   K 4.0 09/03/2015 0453   K 4.1 08/26/2015 0945   CL 104 09/03/2015 0453   CL 105 08/26/2015 0945   CO2 25 09/03/2015 0453   CO2 23 08/26/2015 0945   BUN 13 09/03/2015 0453   BUN 21* 08/26/2015 0945   CREATININE 0.94 09/03/2015 0453   CREATININE 0.92 08/26/2015 0945   CREATININE 0.87 03/08/2015 0001   CREATININE 0.81 08/06/2013 1203   GLUCOSE 205* 09/03/2015 0453   GLUCOSE 246* 08/26/2015 0945   CALCIUM 8.2* 09/03/2015 0453   CALCIUM 9.6 08/26/2015 0945   AST 14 03/08/2015 0001   AST 11 09/08/2013 1835   ALT 13 03/08/2015 0001   ALT 10 09/08/2013 1835   ALKPHOS 57 03/08/2015 0001   ALKPHOS 88 09/08/2013 1835   BILITOT 0.8 03/08/2015 0001   BILITOT 0.3 09/08/2013 1835   PROT 6.9 03/08/2015 0001   PROT 7.5 09/08/2013 1835   ALBUMIN 4.5 03/08/2015 0001   ALBUMIN 3.8 09/08/2013 1835     Studies/Results: No results found.  Anti-infectives: Anti-infectives    Start     Dose/Rate Route Frequency  Ordered Stop   09/02/15 2000  cefoTEtan (CEFOTAN) 2 g in dextrose 5 % 50 mL IVPB     2 g 100 mL/hr over 30 Minutes Intravenous Every 12 hours 09/02/15 1417 09/02/15 2043   09/02/15 0531  cefoTEtan (CEFOTAN) 2 g in dextrose 5 % 50 mL IVPB     2 g 100 mL/hr over 30 Minutes Intravenous On call to O.R. 09/02/15 0531 09/02/15 0745      Assessment Principal Problem:   Adenomatous colon polyp s/p laparoscopic right colectomy 09/02/15 Active Problems:   Hypertension associated with diabetes ([place])   Type 2 diabetes mellitus with diabetic neuropathy (HCC)-poor control on current SSI   ABL anemia   LOS: 1 day   Plan: Try clear liquids.  Mobilize.  Repeat CBC tomorrow.  Increase SSI.   Odis Hollingshead 09/03/2015

## 2015-09-03 NOTE — Progress Notes (Signed)
Utilization review completed.  

## 2015-09-03 NOTE — Evaluation (Signed)
Physical Therapy Evaluation Patient Details Name: [name] MRN: [id number] DOB: 1941/01/04 Today's Date: 09/03/2015   History of Present Illness  75 yo female s/p LAPAROSCOPIC RIGHT COLECTOMY; PMHx: CVA, L knee fx--s/p ORIF, HTN, DM  Clinical Impression  Pt admitted with above diagnosis. Pt currently with functional limitations due to the deficits listed below (see PT Problem List).  Pt will benefit from skilled PT to increase their independence and safety with mobility to allow discharge to the venue listed below.  Pt should progress well and D/C without f/u PT; will continue to follow in acute setting as pt is slightly impulsive and has LOB 3x times during PT eval; advised pt and dtr she may need to use her RW initially at home depending on progress     Follow Up Recommendations No PT follow up    Equipment Recommendations  None recommended by PT    Recommendations for Other Services       Precautions / Restrictions Precautions Precautions: Fall Restrictions Weight Bearing Restrictions: No      Mobility  Bed Mobility Overal bed mobility: Needs Assistance Bed Mobility: Supine to Sit     Supine to sit: Supervision     General bed mobility comments: for lines and safety  Transfers Overall transfer level: Needs assistance Equipment used: Rolling walker (2 wheeled);None Transfers: Sit to/from Stand Sit to Stand: Min assist         General transfer comment: cues for hand placement and safety  Ambulation/Gait Ambulation/Gait assistance: Min assist Ambulation Distance (Feet): 140 Feet (15' more) Assistive device: Rolling walker (2 wheeled) Gait Pattern/deviations: Trunk flexed;Step-through pattern;Decreased stride length     General Gait Details: verbal cues for RW safety, especially with turns, has difficulty staying in frame of RW, moves very quickly; LOB x3 with min to recover  Stairs            Wheelchair Mobility    Modified Rankin (Stroke  Patients Only)       Balance Overall balance assessment: Needs assistance           Standing balance-Leahy Scale: Fair               High level balance activites: Side stepping;Backward walking;Direction changes;Turns High Level Balance Comments: LOB x 3 during dynamic acitivities             Pertinent Vitals/Pain Pain Assessment: Faces Faces Pain Scale: Hurts little more Pain Location: abd Pain Intervention(s): Limited activity within patient's tolerance;Monitored during session;Repositioned    Home Living Family/patient expects to be discharged to:: Private residence Living Arrangements: Spouse/significant other;Children Available Help at Discharge: Family;Available 24 hours/day Type of Home: House Home Access: Stairs to enter Entrance Stairs-Rails: None Entrance Stairs-Number of Steps: 1 Home Layout: One level Home Equipment: Walker - 2 wheels;Bedside commode Additional Comments: pt was independent prior to this adm, did not use either of them    Prior Function Level of Independence: Independent               Hand Dominance        Extremity/Trunk Assessment   Upper Extremity Assessment: Overall WFL for tasks assessed           Lower Extremity Assessment: Overall WFL for tasks assessed         Communication      Cognition Arousal/Alertness: Awake/alert Behavior During Therapy: Impulsive Overall Cognitive Status: Within Functional Limits for tasks assessed  General Comments      Exercises        Assessment/Plan    PT Assessment Patient needs continued PT services  PT Diagnosis Difficulty walking   PT Problem List Decreased balance;Decreased mobility;Decreased activity tolerance;Pain  PT Treatment Interventions DME instruction;Gait training;Functional mobility training;Therapeutic activities;Patient/family education;Balance training;Therapeutic exercise   PT Goals (Current goals can be found in  the Care Plan section) Acute Rehab PT Goals Patient Stated Goal: return to independence PT Goal Formulation: With patient Time For Goal Achievement: 09/10/15 Potential to Achieve Goals: Good    Frequency Min 3X/week   Barriers to discharge        Co-evaluation               End of Session Equipment Utilized During Treatment: Gait belt Activity Tolerance: Patient tolerated treatment well Patient left: in chair;with call bell/phone within reach;with family/visitor present Nurse Communication: Mobility status         Time: 1012-1043 PT Time Calculation (min) (ACUTE ONLY): 31 min   Charges:   PT Evaluation $PT Eval Low Complexity: 1 Procedure PT Treatments $Gait Training: 8-22 mins   PT G Codes:        Robbi Spells 2015-09-04, 11:20 AM

## 2015-09-04 LAB — BASIC METABOLIC PANEL
ANION GAP: 8 (ref 5–15)
BUN: 8 mg/dL (ref 6–20)
CHLORIDE: 109 mmol/L (ref 101–111)
CO2: 25 mmol/L (ref 22–32)
Calcium: 8.5 mg/dL — ABNORMAL LOW (ref 8.9–10.3)
Creatinine, Ser: 0.86 mg/dL (ref 0.44–1.00)
GFR calc non Af Amer: >60 mL/min (ref >60)
GLUCOSE: 154 mg/dL — AB (ref 65–99)
POTASSIUM: 4.1 mmol/L (ref 3.5–5.1)
Sodium: 142 mmol/L (ref 135–145)

## 2015-09-04 LAB — GLUCOSE, CAPILLARY
GLUCOSE-CAPILLARY: 144 mg/dL — AB (ref 65–99)
GLUCOSE-CAPILLARY: 183 mg/dL — AB (ref 65–99)
GLUCOSE-CAPILLARY: 99 mg/dL (ref 65–99)
Glucose-Capillary: 144 mg/dL — ABNORMAL HIGH (ref 65–99)
Glucose-Capillary: 172 mg/dL — ABNORMAL HIGH (ref 65–99)

## 2015-09-04 LAB — CBC
HEMATOCRIT: 31.9 % — AB (ref 36.0–46.0)
HEMOGLOBIN: 10.5 g/dL — AB (ref 12.0–15.0)
MCH: 30.5 pg (ref 26.0–34.0)
MCHC: 32.9 g/dL (ref 30.0–36.0)
MCV: 92.7 fL (ref 78.0–100.0)
Platelets: 333 10*3/uL (ref 150–400)
RBC: 3.44 MIL/uL — AB (ref 3.87–5.11)
RDW: 14.2 % (ref 11.5–15.5)
WBC: 19.6 10*3/uL — ABNORMAL HIGH (ref 4.0–10.5)

## 2015-09-04 MED ORDER — CANAGLIFLOZIN 100 MG PO TABS
100.0000 mg | ORAL_TABLET | Freq: Every day | ORAL | Status: DC
  Administered 2015-09-04 – 2015-09-06 (×3): 100 mg via ORAL
  Filled 2015-09-04 (×5): qty 1

## 2015-09-04 MED ORDER — SITAGLIPTIN PHOS-METFORMIN HCL 50-1000 MG PO TABS: 1.0000 | ORAL_TABLET | Freq: Two times a day (BID) | ORAL | Status: DC

## 2015-09-04 MED ORDER — INSULIN ASPART 100 UNIT/ML ~~LOC~~ SOLN
0.0000 [IU] | Freq: Three times a day (TID) | SUBCUTANEOUS | Status: DC
  Administered 2015-09-04 – 2015-09-05 (×3): 4 [IU] via SUBCUTANEOUS
  Administered 2015-09-05: 3 [IU] via SUBCUTANEOUS
  Administered 2015-09-06: 4 [IU] via SUBCUTANEOUS

## 2015-09-04 NOTE — Progress Notes (Signed)
2 Days Post-Op  Subjective: Bowels have started to move.  Tolerating clear liquid diet.  Objective: Vital signs in last 24 hours: Temp:  [98.2 F (36.8 C)-98.4 F (36.9 C)] 98.2 F (36.8 C) (02/19 0459) Pulse Rate:  [65-71] 66 (02/19 0459) Resp:  [16] 16 (02/19 0459) BP: (114-131)/(52-64) 131/53 mmHg (02/19 0459) SpO2:  [96 %-98 %] 96 % (02/19 0459) Last BM Date: 09/04/15  Intake/Output from previous day: 02/18 0701 - 02/19 0700 In: 1140 [P.O.:240; I.V.:900] Out: 2350 [Urine:2350] Intake/Output this shift:    PE: General- In NAD Abdomen-soft, incisions clean and intact  Lab Results:   Recent Labs  09/03/15 0453 09/04/15 0525  WBC 32.1* 19.6*  HGB 10.7* 10.5*  HCT 32.5* 31.9*  PLT 366 333   BMET  Recent Labs  09/03/15 0453 09/04/15 0525  NA 136 142  K 4.0 4.1  CL 104 109  CO2 25 25  GLUCOSE 205* 154*  BUN 13 8  CREATININE 0.94 0.86  CALCIUM 8.2* 8.5*   PT/INR No results for input(s): LABPROT, INR in the last 72 hours. Comprehensive Metabolic Panel:    Component Value Date/Time   NA 142 09/04/2015 0525   NA 136 09/03/2015 0453   K 4.1 09/04/2015 0525   K 4.0 09/03/2015 0453   CL 109 09/04/2015 0525   CL 104 09/03/2015 0453   CO2 25 09/04/2015 0525   CO2 25 09/03/2015 0453   BUN 8 09/04/2015 0525   BUN 13 09/03/2015 0453   CREATININE 0.86 09/04/2015 0525   CREATININE 0.94 09/03/2015 0453   CREATININE 0.87 03/08/2015 0001   CREATININE 0.81 08/06/2013 1203   GLUCOSE 154* 09/04/2015 0525   GLUCOSE 205* 09/03/2015 0453   CALCIUM 8.5* 09/04/2015 0525   CALCIUM 8.2* 09/03/2015 0453   AST 14 03/08/2015 0001   AST 11 09/08/2013 1835   ALT 13 03/08/2015 0001   ALT 10 09/08/2013 1835   ALKPHOS 57 03/08/2015 0001   ALKPHOS 88 09/08/2013 1835   BILITOT 0.8 03/08/2015 0001   BILITOT 0.3 09/08/2013 1835   PROT 6.9 03/08/2015 0001   PROT 7.5 09/08/2013 1835   ALBUMIN 4.5 03/08/2015 0001   ALBUMIN 3.8 09/08/2013 1835     Studies/Results: No  results found.  Anti-infectives: Anti-infectives    Start     Dose/Rate Route Frequency Ordered Stop   09/02/15 2000  cefoTEtan (CEFOTAN) 2 g in dextrose 5 % 50 mL IVPB     2 g 100 mL/hr over 30 Minutes Intravenous Every 12 hours 09/02/15 1417 09/02/15 2043   09/02/15 0531  cefoTEtan (CEFOTAN) 2 g in dextrose 5 % 50 mL IVPB     2 g 100 mL/hr over 30 Minutes Intravenous On call to O.R. 09/02/15 0531 09/02/15 0745      Assessment Principal Problem:   Adenomatous colon polyp s/p laparoscopic right colectomy 09/02/15-progressing well Active Problems:   Hypertension associated with diabetes (HCC)-BP wnl   Type 2 diabetes mellitus with diabetic neuropathy (HCC)- sbgs 126-156 on resistant SSI   ABL anemia-hemoglobin 10.5->stable   LOS: 2 days   Plan:  Advance diet. Restart Invokana and Janumet.   [name] 09/04/2015

## 2015-09-05 LAB — CBC
HEMATOCRIT: 36 % (ref 36.0–46.0)
Hemoglobin: 11.4 g/dL — ABNORMAL LOW (ref 12.0–15.0)
MCH: 30.2 pg (ref 26.0–34.0)
MCHC: 31.7 g/dL (ref 30.0–36.0)
MCV: 95.5 fL (ref 78.0–100.0)
Platelets: 361 10*3/uL (ref 150–400)
RBC: 3.77 MIL/uL — ABNORMAL LOW (ref 3.87–5.11)
RDW: 14.4 % (ref 11.5–15.5)
WBC: 17.7 10*3/uL — ABNORMAL HIGH (ref 4.0–10.5)

## 2015-09-05 LAB — BASIC METABOLIC PANEL
ANION GAP: 9 (ref 5–15)
BUN: 9 mg/dL (ref 6–20)
CALCIUM: 8.6 mg/dL — AB (ref 8.9–10.3)
CO2: 25 mmol/L (ref 22–32)
Chloride: 106 mmol/L (ref 101–111)
Creatinine, Ser: 0.94 mg/dL (ref 0.44–1.00)
GFR calc Af Amer: >60 mL/min (ref >60)
GFR calc non Af Amer: 58 mL/min — ABNORMAL LOW (ref >60)
GLUCOSE: 173 mg/dL — AB (ref 65–99)
POTASSIUM: 4.4 mmol/L (ref 3.5–5.1)
Sodium: 140 mmol/L (ref 135–145)

## 2015-09-05 LAB — GLUCOSE, CAPILLARY
Glucose-Capillary: 148 mg/dL — ABNORMAL HIGH (ref 65–99)
Glucose-Capillary: 119 mg/dL — ABNORMAL HIGH (ref 65–99)
Glucose-Capillary: 129 mg/dL — ABNORMAL HIGH (ref 65–99)
Glucose-Capillary: 183 mg/dL — ABNORMAL HIGH (ref 65–99)

## 2015-09-05 MED ORDER — ACETAMINOPHEN 325 MG PO TABS
650.0000 mg | ORAL_TABLET | ORAL | Status: DC
  Administered 2015-09-05 – 2015-09-06 (×5): 650 mg via ORAL
  Filled 2015-09-05 (×11): qty 2

## 2015-09-05 MED ORDER — IBUPROFEN 200 MG PO TABS
400.0000 mg | ORAL_TABLET | Freq: Four times a day (QID) | ORAL | Status: DC | PRN
  Administered 2015-09-05: 400 mg via ORAL
  Filled 2015-09-05: qty 2

## 2015-09-05 NOTE — Care Management Important Message (Signed)
Important Message  Patient Details  Name: [name] MRN: [id number] Date of Birth: 14-Oct-1940   Medicare Important Message Given:  Yes    Camillo Flaming 09/05/2015, 3:45 [place] Message  Patient Details  Name: [name] MRN: [id number] Date of Birth: [birth year]   Medicare Important Message Given:  Yes    Camillo Flaming 09/05/2015, 3:45 PM

## 2015-09-05 NOTE — Progress Notes (Signed)
3 Days Post-Op Lap R colectomy Subjective: Having bowel function.  Having nausea related to administration of narcotics  Objective: Vital signs in last 24 hours: Temp:  [98.2 F (36.8 C)] 98.2 F (36.8 C) (02/20 0547) Pulse Rate:  [67-71] 71 (02/20 0547) Resp:  [16] 16 (02/20 0547) BP: (93-135)/(45-62) 124/59 mmHg (02/20 0547) SpO2:  [95 %-96 %] 95 % (02/20 0547)   Intake/Output from previous day: 02/19 0701 - 02/20 0700 In: 1512.5 [P.O.:240; I.V.:1272.5] Out: 1000 [Urine:1000] Intake/Output this shift: Total I/O In: -  Out: 500 [Urine:500]   General appearance: alert and cooperative GI: soft, non-distended  Incision: no significant drainage  Lab Results:   Recent Labs  09/04/15 0525 09/05/15 0432  WBC 19.6* 17.7*  HGB 10.5* 11.4*  HCT 31.9* 36.0  PLT 333 361   BMET  Recent Labs  09/04/15 0525 09/05/15 0432  NA 142 140  K 4.1 4.4  CL 109 106  CO2 25 25  GLUCOSE 154* 173*  BUN 8 9  CREATININE 0.86 0.94  CALCIUM 8.5* 8.6*   PT/INR No results for input(s): LABPROT, INR in the last 72 hours. ABG No results for input(s): PHART, HCO3 in the last 72 hours.  Invalid input(s): PCO2, PO2  MEDS, Scheduled . acetaminophen  650 mg Oral Q4H  . canagliflozin  100 mg Oral QAC breakfast  . enoxaparin (LOVENOX) injection  40 mg Subcutaneous Q24H  . insulin aspart  0-20 Units Subcutaneous TID WC  . linagliptin  5 mg Oral Q breakfast   And  . metFORMIN  1,000 mg Oral Q breakfast  . metFORMIN  1,000 mg Oral Q supper  . rosuvastatin  20 mg Oral Daily  . verapamil  240 mg Oral Daily    Studies/Results: No results found.  Assessment: s/p Procedure(s): LAPAROSCOPIC PARTIAL RIGHT COLECTOMY Patient Active Problem List   Diagnosis Date Noted  . Adenomatous colon polyp s/p laparoscopic right colectomy 09/02/15 09/02/2015  . Hemispheric carotid artery syndrome 04/07/2015  . Cataract associated with type 2 diabetes mellitus ([place]) 03/08/2015  . Type 2 diabetes  mellitus with diabetic neuropathy ([place]) 09/20/2014  . S/P recent Left carotid endarterectomy 07/14/2013  . Stroke ([place]) 06/27/2013  . Hypertension associated with diabetes ([place]) 01/04/2011  . DM type 2 with diabetic dyslipidemia ([place]) 01/04/2011  . Light smoker 01/04/2011    Narcotics induced nausea  Plan: Cont Diet and a IVF's  Will switch to non-narcotic pain control Recheck CBC in AM   LOS: 3 days     .Rosario Adie, [facility], [place]   09/05/2015 8:25 AM

## 2015-09-05 NOTE — Progress Notes (Signed)
Physical Therapy Treatment Patient Details Name: [name] MRN: [id number] DOB: [birth year] Today's Date: 09/05/2015    History of Present Illness 75 yo female s/p LAPAROSCOPIC RIGHT COLECTOMY; PMHx: CVA, L knee fx--s/p ORIF, HTN, DM    PT Comments    Pt very limited by severe back pain to L of spine in L4-5 region, she has difficulty describing pain but says it is horrible, it does not radiate to LLE; pt trunk is laterally shifted and she  is unable to extend trunk in standing d/t back pain today; will continue to follow for needs  Follow Up Recommendations  Home health PT;Supervision for mobility/OOB (vs SNF depending on progress)     Equipment Recommendations       Recommendations for Other Services       Precautions / Restrictions Precautions Precautions: Fall Restrictions Weight Bearing Restrictions: No    Mobility  Bed Mobility Overal bed mobility: Needs Assistance Bed Mobility: Supine to Sit     Supine to sit: Supervision     General bed mobility comments: incr time, supervision for safety  Transfers Overall transfer level: Needs assistance Equipment used: Rolling walker (2 wheeled);None Transfers: Sit to/from [employer] to Stand: Min assist;Mod assist Stand pivot transfers: Min assist       General transfer comment: cues for hand placement and safety  Ambulation/Gait Ambulation/Gait assistance: Mod assist Ambulation Distance (Feet): 12 Feet (5') Assistive device: Rolling walker (2 wheeled) Gait Pattern/deviations: Step-to pattern;Decreased stance time - left;Trunk flexed Gait velocity: chair brought to pt d/t L knee buckling, unable to amb back to bed   General Gait Details: verbal cues for RW safety, especially with turns, has difficulty staying in frame of RW; assist for balance, L knee buckling d/t back pain   Stairs            Wheelchair Mobility    Modified Rankin (Stroke Patients Only)       Balance             Standing balance-Leahy Scale: Poor                      Cognition Arousal/Alertness: Awake/alert Behavior During Therapy: WFL for tasks assessed/performed Overall Cognitive Status: Within Functional Limits for tasks assessed                      Exercises      General Comments        Pertinent Vitals/Pain Pain Assessment: 0-10 Pain Score: 8  Pain Location: back Pain Descriptors / Indicators: Jabbing;Sharp;Grimacing;Guarding;Contraction Pain Intervention(s): Limited activity within patient's tolerance;Monitored during session;Repositioned    Home Living                      Prior Function            PT Goals (current goals can now be found in the care plan section) Acute Rehab PT Goals Patient Stated Goal: return to independence PT Goal Formulation: With patient/family Time For Goal Achievement: 09/10/15 Potential to Achieve Goals: Good Progress towards PT goals: Not progressing toward goals - comment (d/t back pain)    Frequency  Min 3X/week    PT Plan Current plan remains appropriate;Discharge plan needs to be updated    Co-evaluation             End of Session Equipment Utilized During Treatment: Gait belt Activity Tolerance: Patient tolerated treatment well Patient left: in chair;with call  bell/phone within reach;with family/visitor present     Time: 1135-1200 PT Time Calculation (min) (ACUTE ONLY): 25 min  Charges:  $Gait Training: 8-22 mins $Therapeutic Activity: 8-22 mins                    G Codes:      Vannary Greening 2015-09-25, 12:32 PM

## 2015-09-06 LAB — CBC
HEMATOCRIT: 31.9 % — AB (ref 36.0–46.0)
Hemoglobin: 10.4 g/dL — ABNORMAL LOW (ref 12.0–15.0)
MCH: 30.3 pg (ref 26.0–34.0)
MCHC: 32.6 g/dL (ref 30.0–36.0)
MCV: 93 fL (ref 78.0–100.0)
Platelets: 344 10*3/uL (ref 150–400)
RBC: 3.43 MIL/uL — ABNORMAL LOW (ref 3.87–5.11)
RDW: 14 % (ref 11.5–15.5)
WBC: 12.4 10*3/uL — ABNORMAL HIGH (ref 4.0–10.5)

## 2015-09-06 LAB — GLUCOSE, CAPILLARY: Glucose-Capillary: 169 mg/dL — ABNORMAL HIGH (ref 65–99)

## 2015-09-06 NOTE — Progress Notes (Signed)
Discharge instructions given. Questions answered 

## 2015-09-06 NOTE — Discharge Instructions (Signed)

## 2015-09-06 NOTE — Care Management Note (Signed)
Case Management Note  Patient Details  Name: WHITTNEY STEENSON MRN: [id number] Date of Birth: 1940/10/25  Subjective/Objective:   LAPAROSCOPIC RIGHT COLECTOMY                 Action/Plan: Discharge planning, no HH needs identified  Expected Discharge Date:  09/04/15               Expected Discharge Plan:  Home/Self Care  In-House Referral:  NA  Discharge planning Services  CM Consult  Post Acute Care Choice:  NA Choice offered to:  NA  DME Arranged:  N/A DME Agency:  NA  HH Arranged:  NA HH Agency:  NA  Status of Service:  Completed, signed off  Medicare Important Message Given:  Yes Date Medicare IM Given:    Medicare IM give by:    Date Additional Medicare IM Given:    Additional Medicare Important Message give by:     If discussed at [place] of Stay Meetings, dates discussed:    Additional Comments:  Guadalupe Maple, RN 09/06/2015, 10:46 AM [phone]

## 2015-09-06 NOTE — Discharge Summary (Signed)
Patient ID: [name] [id number] 74 y.o. [birth year]  09/02/2015  Discharge date and time: 09/06/2015  9:59 AM  Admitting Physician: Rosario Adie  Discharge Physician: Rosario Adie.  Admission Diagnoses: colon polyps  Discharge Diagnoses: Colon polyp  Operations: LAPAROSCOPIC RIGHT COLECTOMY    Discharged Condition: good  Gen: NAD Abd: soft, non-distended Inc: clean, dry, intact  Hospital Course: Patient was admitted after surgery.  Her diet was advanced as tolerated.  Her foley was removed by POD 2.  She had a lot of nausea with narcotics and we switched her to scheduled tylenol instead.  She was having some back pain and discharge.  This is similar back pain she has had before and thought it was due to how she was sleeping.  Her lab work appeared normal as well.  Consults: None  Significant Diagnostic Studies: labs: cbc, chemistry  Treatments: IV hydration, analgesia: acetaminophen and surgery: see above  Disposition: Home

## 2015-09-26 ENCOUNTER — Encounter: Payer: Self-pay | Admitting: Family

## 2015-09-29 ENCOUNTER — Ambulatory Visit (INDEPENDENT_AMBULATORY_CARE_PROVIDER_SITE_OTHER): Payer: Medicare Other | Admitting: Family

## 2015-09-29 ENCOUNTER — Ambulatory Visit (HOSPITAL_COMMUNITY)
Admission: RE | Admit: 2015-09-29 | Discharge: 2015-09-29 | Disposition: A | Discharge status: Home / Self Care | Payer: Medicare Other | Source: Ambulatory Visit | Attending: Family | Admitting: Family

## 2015-09-29 ENCOUNTER — Encounter: Payer: Self-pay | Admitting: Family

## 2015-09-29 VITALS — BP 120/66 | HR 73 | Temp 97.2°F | Resp 16 | Ht 66.0 in | Wt 144.0 lb

## 2015-09-29 DIAGNOSIS — K219 Gastro-esophageal reflux disease without esophagitis: Secondary | ICD-10-CM | POA: Diagnosis not present

## 2015-09-29 DIAGNOSIS — I10 Essential (primary) hypertension: Secondary | ICD-10-CM | POA: Diagnosis not present

## 2015-09-29 DIAGNOSIS — Z720 Tobacco use: Secondary | ICD-10-CM | POA: Diagnosis not present

## 2015-09-29 DIAGNOSIS — Z48812 Encounter for surgical aftercare following surgery on the circulatory system: Secondary | ICD-10-CM | POA: Diagnosis not present

## 2015-09-29 DIAGNOSIS — E785 Hyperlipidemia, unspecified: Secondary | ICD-10-CM | POA: Insufficient documentation

## 2015-09-29 DIAGNOSIS — F17200 Nicotine dependence, unspecified, uncomplicated: Secondary | ICD-10-CM

## 2015-09-29 DIAGNOSIS — E1140 Type 2 diabetes mellitus with diabetic neuropathy, unspecified: Secondary | ICD-10-CM | POA: Diagnosis not present

## 2015-09-29 DIAGNOSIS — Z8673 Personal history of transient ischemic attack (TIA), and cerebral infarction without residual deficits: Secondary | ICD-10-CM | POA: Diagnosis not present

## 2015-09-29 DIAGNOSIS — I6523 Occlusion and stenosis of bilateral carotid arteries: Secondary | ICD-10-CM | POA: Diagnosis not present

## 2015-09-29 DIAGNOSIS — I6522 Occlusion and stenosis of left carotid artery: Secondary | ICD-10-CM | POA: Diagnosis not present

## 2015-09-29 NOTE — Patient Instructions (Signed)
Stroke Prevention Some medical conditions and behaviors are associated with an increased chance of having a stroke. You may prevent a stroke by making healthy choices and managing medical conditions. HOW CAN I REDUCE MY RISK OF HAVING A STROKE?   Stay physically active. Get at least 30 minutes of activity on most or all days.  Do not smoke. It may also be helpful to avoid exposure to secondhand smoke.  Limit alcohol use. Moderate alcohol use is considered to be:  No more than 2 drinks per day for men.  No more than 1 drink per day for nonpregnant women.  Eat healthy foods. This involves:  Eating 5 or more servings of fruits and vegetables a day.  Making dietary changes that address high blood pressure (hypertension), high cholesterol, diabetes, or obesity.  Manage your cholesterol levels.  Making food choices that are high in fiber and low in saturated fat, trans fat, and cholesterol may control cholesterol levels.  Take any prescribed medicines to control cholesterol as directed by your health care provider.  Manage your diabetes.  Controlling your carbohydrate and sugar intake is recommended to manage diabetes.  Take any prescribed medicines to control diabetes as directed by your health care provider.  Control your hypertension.  Making food choices that are low in salt (sodium), saturated fat, trans fat, and cholesterol is recommended to manage hypertension.  Ask your health care provider if you need treatment to lower your blood pressure. Take any prescribed medicines to control hypertension as directed by your health care provider.  If you are 18-39 years of age, have your blood pressure checked every 3-5 years. If you are 40 years of age or older, have your blood pressure checked every year.  Maintain a healthy weight.  Reducing calorie intake and making food choices that are low in sodium, saturated fat, trans fat, and cholesterol are recommended to manage  weight.  Stop drug abuse.  Avoid taking birth control pills.  Talk to your health care provider about the risks of taking birth control pills if you are over 35 years old, smoke, get migraines, or have ever had a blood clot.  Get evaluated for sleep disorders (sleep apnea).  Talk to your health care provider about getting a sleep evaluation if you snore a lot or have excessive sleepiness.  Take medicines only as directed by your health care provider.  For some people, aspirin or blood thinners (anticoagulants) are helpful in reducing the risk of forming abnormal blood clots that can lead to stroke. If you have the irregular heart rhythm of atrial fibrillation, you should be on a blood thinner unless there is a good reason you cannot take them.  Understand all your medicine instructions.  Make sure that other conditions (such as anemia or atherosclerosis) are addressed. SEEK IMMEDIATE MEDICAL CARE IF:   You have sudden weakness or numbness of the face, arm, or leg, especially on one side of the body.  Your face or eyelid droops to one side.  You have sudden confusion.  You have trouble speaking (aphasia) or understanding.  You have sudden trouble seeing in one or both eyes.  You have sudden trouble walking.  You have dizziness.  You have a loss of balance or coordination.  You have a sudden, severe headache with no known cause.  You have new chest pain or an irregular heartbeat. Any of these symptoms may represent a serious problem that is an emergency. Do not wait to see if the symptoms will   go away. Get medical help at once. Call your local emergency services (911 in [place] Do not drive yourself to the hospital.   This information is not intended to replace advice given to you by your health care provider. Make sure you discuss any questions you have with your health care provider.   Document Released: 08/09/2004 Document Revised: 07/23/2014 Document Reviewed:  01/02/2013 Elsevier Interactive Patient Education 2016 Elsevier Inc.    Smoking Cessation, Tips for Success If you are ready to quit smoking, congratulations! You have chosen to help yourself be healthier. Cigarettes bring nicotine, tar, carbon monoxide, and other irritants into your body. Your lungs, heart, and blood vessels will be able to work better without these poisons. There are many different ways to quit smoking. Nicotine gum, nicotine patches, a nicotine inhaler, or nicotine nasal spray can help with physical craving. Hypnosis, support groups, and medicines help break the habit of smoking. WHAT THINGS CAN I DO TO MAKE QUITTING EASIER?  Here are some tips to help you quit for good:  Pick a date when you will quit smoking completely. Tell all of your friends and family about your plan to quit on that date.  Do not try to slowly cut down on the number of cigarettes you are smoking. Pick a quit date and quit smoking completely starting on that day.  Throw away all cigarettes.   Clean and remove all ashtrays from your home, work, and car.  On a card, write down your reasons for quitting. Carry the card with you and read it when you get the urge to smoke.  Cleanse your body of nicotine. Drink enough water and fluids to keep your urine clear or pale yellow. Do this after quitting to flush the nicotine from your body.  Learn to predict your moods. Do not let a bad situation be your excuse to have a cigarette. Some situations in your life might tempt you into wanting a cigarette.  Never have "just one" cigarette. It leads to wanting another and another. Remind yourself of your decision to quit.  Change habits associated with smoking. If you smoked while driving or when feeling stressed, try other activities to replace smoking. Stand up when drinking your coffee. Brush your teeth after eating. Sit in a different chair when you read the paper. Avoid alcohol while trying to quit, and try to  drink fewer caffeinated beverages. Alcohol and caffeine may urge you to smoke.  Avoid foods and drinks that can trigger a desire to smoke, such as sugary or spicy foods and alcohol.  Ask people who smoke not to smoke around you.  Have something planned to do right after eating or having a cup of coffee. For example, plan to take a walk or exercise.  Try a relaxation exercise to calm you down and decrease your stress. Remember, you may be tense and nervous for the first 2 weeks after you quit, but this will pass.  Find new activities to keep your hands busy. Play with a pen, coin, or rubber band. Doodle or draw things on paper.  Brush your teeth right after eating. This will help cut down on the craving for the taste of tobacco after meals. You can also try mouthwash.   Use oral substitutes in place of cigarettes. Try using lemon drops, carrots, cinnamon sticks, or chewing gum. Keep them handy so they are available when you have the urge to smoke.  When you have the urge to smoke, try deep breathing.    Designate your home as a nonsmoking area.  If you are a heavy smoker, ask your health care provider about a prescription for nicotine chewing gum. It can ease your withdrawal from nicotine.  Reward yourself. Set aside the cigarette money you save and buy yourself something nice.  Look for support from others. Join a support group or smoking cessation program. Ask someone at home or at work to help you with your plan to quit smoking.  Always ask yourself, "Do I need this cigarette or is this just a reflex?" Tell yourself, "Today, I choose not to smoke," or "I do not want to smoke." You are reminding yourself of your decision to quit.  Do not replace cigarette smoking with electronic cigarettes (commonly called e-cigarettes). The safety of e-cigarettes is unknown, and some may contain harmful chemicals.  If you relapse, do not give up! Plan ahead and think about what you will do the next  time you get the urge to smoke. HOW WILL I FEEL WHEN I QUIT SMOKING? You may have symptoms of withdrawal because your body is used to nicotine (the addictive substance in cigarettes). You may crave cigarettes, be irritable, feel very hungry, cough often, get headaches, or have difficulty concentrating. The withdrawal symptoms are only temporary. They are strongest when you first quit but will go away within 10-14 days. When withdrawal symptoms occur, stay in control. Think about your reasons for quitting. Remind yourself that these are signs that your body is healing and getting used to being without cigarettes. Remember that withdrawal symptoms are easier to treat than the major diseases that smoking can cause.  Even after the withdrawal is over, expect periodic urges to smoke. However, these cravings are generally short lived and will go away whether you smoke or not. Do not smoke! WHAT RESOURCES ARE AVAILABLE TO HELP ME QUIT SMOKING? Your health care provider can direct you to community resources or hospitals for support, which may include:  Group support.  Education.  Hypnosis.  Therapy.   This information is not intended to replace advice given to you by your health care provider. Make sure you discuss any questions you have with your health care provider.   Document Released: 03/30/2004 Document Revised: 07/23/2014 Document Reviewed: 12/18/2012 Elsevier Interactive Patient Education 2016 Elsevier Inc.   Steps to Quit Smoking  Smoking tobacco can be harmful to your health and can affect almost every organ in your body. Smoking puts you, and those around you, at risk for developing many serious chronic diseases. Quitting smoking is difficult, but it is one of the best things that you can do for your health. It is never too late to quit. WHAT ARE THE BENEFITS OF QUITTING SMOKING? When you quit smoking, you lower your risk of developing serious diseases and conditions, such as:  Lung  cancer or lung disease, such as COPD.  Heart disease.  Stroke.  Heart attack.  Infertility.  Osteoporosis and bone fractures. Additionally, symptoms such as coughing, wheezing, and shortness of breath may get better when you quit. You may also find that you get sick less often because your body is stronger at fighting off colds and infections. If you are pregnant, quitting smoking can help to reduce your chances of having a baby of low birth weight. HOW DO I GET READY TO QUIT? When you decide to quit smoking, create a plan to make sure that you are successful. Before you quit:  Pick a date to quit. Set a date within the   next two weeks to give you time to prepare.  Write down the reasons why you are quitting. Keep this list in places where you will see it often, such as on your bathroom mirror or in your car or wallet.  Identify the people, places, things, and activities that make you want to smoke (triggers) and avoid them. Make sure to take these actions:  Throw away all cigarettes at home, at work, and in your car.  Throw away smoking accessories, such as ashtrays and lighters.  Clean your car and make sure to empty the ashtray.  Clean your home, including curtains and carpets.  Tell your family, friends, and coworkers that you are quitting. Support from your loved ones can make quitting easier.  Talk with your health care provider about your options for quitting smoking.  Find out what treatment options are covered by your health insurance. WHAT STRATEGIES CAN I USE TO QUIT SMOKING?  Talk with your healthcare provider about different strategies to quit smoking. Some strategies include:  Quitting smoking altogether instead of gradually lessening how much you smoke over a period of time. Research shows that quitting "cold turkey" is more successful than gradually quitting.  Attending in-person counseling to help you build problem-solving skills. You are more likely to have  success in quitting if you attend several counseling sessions. Even short sessions of 10 minutes can be effective.  Finding resources and support systems that can help you to quit smoking and remain smoke-free after you quit. These resources are most helpful when you use them often. They can include:  Online chats with a counselor.  Telephone quitlines.  Printed self-help materials.  Support groups or group counseling.  Text messaging programs.  Mobile phone applications.  Taking medicines to help you quit smoking. (If you are pregnant or breastfeeding, talk with your health care provider first.) Some medicines contain nicotine and some do not. Both types of medicines help with cravings, but the medicines that include nicotine help to relieve withdrawal symptoms. Your health care provider may recommend:  Nicotine patches, gum, or lozenges.  Nicotine inhalers or sprays.  Non-nicotine medicine that is taken by mouth. Talk with your health care provider about combining strategies, such as taking medicines while you are also receiving in-person counseling. Using these two strategies together makes you more likely to succeed in quitting than if you used either strategy on its own. If you are pregnant or breastfeeding, talk with your health care provider about finding counseling or other support strategies to quit smoking. Do not take medicine to help you quit smoking unless told to do so by your health care provider. WHAT THINGS CAN I DO TO MAKE IT EASIER TO QUIT? Quitting smoking might feel overwhelming at first, but there is a lot that you can do to make it easier. Take these important actions:  Reach out to your family and friends and ask that they support and encourage you during this time. Call telephone quitlines, reach out to support groups, or work with a counselor for support.  Ask people who smoke to avoid smoking around you.  Avoid places that trigger you to smoke, such as bars,  parties, or smoke-break areas at work.  Spend time around people who do not smoke.  Lessen stress in your life, because stress can be a smoking trigger for some people. To lessen stress, try:  Exercising regularly.  Deep-breathing exercises.  Yoga.  Meditating.  Performing a body scan. This involves closing your eyes, scanning   your body from head to toe, and noticing which parts of your body are particularly tense. Purposefully relax the muscles in those areas.  Download or purchase mobile phone or tablet apps (applications) that can help you stick to your quit plan by providing reminders, tips, and encouragement. There are many free apps, such as QuitGuide from the CDC (Centers for Disease Control and Prevention). You can find other support for quitting smoking (smoking cessation) through smokefree.gov and other websites. HOW WILL I FEEL WHEN I QUIT SMOKING? Within the first 24 hours of quitting smoking, you may start to feel some withdrawal symptoms. These symptoms are usually most noticeable 2-3 days after quitting, but they usually do not last beyond 2-3 weeks. Changes or symptoms that you might experience include:  Mood swings.  Restlessness, anxiety, or irritation.  Difficulty concentrating.  Dizziness.  Strong cravings for sugary foods in addition to nicotine.  Mild weight gain.  Constipation.  Nausea.  Coughing or a sore throat.  Changes in how your medicines work in your body.  A depressed mood.  Difficulty sleeping (insomnia). After the first 2-3 weeks of quitting, you may start to notice more positive results, such as:  Improved sense of smell and taste.  Decreased coughing and sore throat.  Slower heart rate.  Lower blood pressure.  Clearer skin.  The ability to breathe more easily.  Fewer sick days. Quitting smoking is very challenging for most people. Do not get discouraged if you are not successful the first time. Some people need to make many  attempts to quit before they achieve long-term success. Do your best to stick to your quit plan, and talk with your health care provider if you have any questions or concerns.   This information is not intended to replace advice given to you by your health care provider. Make sure you discuss any questions you have with your health care provider.   Document Released: 06/26/2001 Document Revised: 11/16/2014 Document Reviewed: 11/16/2014 Elsevier Interactive Patient Education 2016 Elsevier Inc.   

## 2015-09-29 NOTE — Progress Notes (Signed)
Chief Complaint: Extracranial Carotid Artery Stenosis   History of Present Illness  [name] is a 75 y.o. female patient of Dr. Oneida Alar who is s/p left CEA with Dacron patch on 07/04/13 for symptomatic left internal carotid stenosis with exophytic calcified plaque. She returns today for follow up.  Patient has a history of TIA (Feb., 2015), or stroke (Dec.,2014) symptoms as manifested by right hand and right facial tingling and numbness, expressive aphasia and confusion;  denies weakness on right side, denies monocular vision loss, denies unilateral facial drooping.  The patient's previous neurologic deficits are resolved.  She has not had any subsequent stroke or TIA.  Pt denies clauduaction symptoms with walking, denies non healing wounds, denies any known cardiac problems. She has left knee issues after falling on her left knee and had surgery years ago. Pt states she walks a great deal.  She had a partial colectomy in February 2017 for polyps that were benign. She had sepsis from c.diff, January, 2015.  Pt Diabetic: Yes, A1C three months ago was 8.0 Pt smoker: smoker (1/3 ppd, started at age 33 yrs)  Pt meds include: Statin : Yes ASA: No Other anticoagulants/antiplatelets: Plavix, started Feb., 2015 when she had the TIA, prescribed by Dr. Leonie Man; pt states she was discharged by Dr. Leonie Man in 2016 and to follow up prn   Past Medical History  Diagnosis Date  . Neuropathy, diabetic ([place])   . Dyslipidemia     takes Crestor daily  . Smoker   . Hypertension     takes Prinzide and Verapamil daily  . Diabetes mellitus     takes Janumet daily  . PONV (postoperative nausea and vomiting)   . Impaired speech     from stroke  . Vertigo     but doesn't take any meds  . Stroke ([place]) 06/25/13  . Carotid artery occlusion   . Gallstones   . Clostridium difficile infection 06/2013  . Heart murmur     hx of  . Pneumonia   . GERD (gastroesophageal reflux disease)      Social History Social History  Substance Use Topics  . Smoking status: Current Some Day Smoker -- 0.20 packs/day    Types: Cigarettes  . Smokeless tobacco: Never Used  . Alcohol Use: No    Family History Family History  Problem Relation Age of Onset  . Leukemia Mother   . Cancer Father     esophageal ca  . Diabetes Mother   . Diabetes Maternal Aunt   . Diabetes Maternal Uncle   . Heart disease Father   . Diabetes Brother   . Diabetes Sister   . Heart attack Brother     x 2    Surgical History Past Surgical History  Procedure Laterality Date  . Cholecystectomy    . Knee surgery Left 31yr ago  . Cataract removed Right   . Endarterectomy Left 07/14/2013    Procedure: ENDARTERECTOMY CAROTID-LEFT;  Surgeon: CElam Dutch MD;  Location: [facility]  Service: Vascular;  Laterality: Left;  . Patch angioplasty Left 07/14/2013    Procedure: LEFT CAROTID ARTERY PATCH ANGIOPLASTY;  Surgeon: CElam Dutch MD;  Location[facility]  Service: Vascular;  Laterality: Left;  . Laparoscopic partial colectomy N/A 09/02/2015    Procedure: LAPAROSCOPIC PARTIAL RIGHT COLECTOMY;  Surgeon: ALeighton Ruff MD;  Location: [facility];  Service: General;  Laterality: N/A;    Allergies  Allergen Reactions  . Codeine Nausea And Vomiting  . Lipitor [Atorvastatin]  Nausea And Vomiting  . Phenergan [Promethazine Hcl] Other (See Comments)    Confusion, hallucinations, severe aggitation    Current Outpatient Prescriptions  Medication Sig Dispense Refill  . aspirin 81 MG tablet Take 81 mg by mouth daily. Started aspirin '81mg'$  when stopped Plavix per instructions    . canagliflozin (INVOKANA) 100 MG TABS tablet Take 1 tablet (100 mg total) by mouth daily before breakfast. 30 tablet 3  . clopidogrel (PLAVIX) 75 MG tablet Take 1 tablet (75 mg total) by mouth daily. 90 tablet 3  . dapagliflozin propanediol (FARXIGA) 5 MG TABS tablet Take 5 mg by mouth daily. 30 tablet 5  . glucose blood (ACCU-CHEK AVIVA  PLUS) test strip Test sugar once daily 100 each 3  . Lancet Devices (ACCU-CHEK SOFTCLIX) lancets Test blood sugar once daily 300 each 3  . lisinopril-hydrochlorothiazide (PRINZIDE,ZESTORETIC) 20-12.5 MG tablet take 1 tablet by mouth once daily 30 tablet 5  . rosuvastatin (CRESTOR) 20 MG tablet Take 1 tablet (20 mg total) by mouth daily. 90 tablet 3  . sitaGLIPtin-metformin (JANUMET) 50-1000 MG tablet Take 1 tablet by mouth 2 (two) times daily with a meal. 180 tablet 1  . verapamil (CALAN-SR) 240 MG CR tablet Take 1 tablet (240 mg total) by mouth daily. 90 tablet 1   No current facility-administered medications for this visit.    Review of Systems : See HPI for pertinent positives and negatives.  Physical Examination  Filed Vitals:   09/29/15 1057 09/29/15 1101  BP: 130/70 120/66  Pulse: 78 73  Temp: 97.2 F (36.2 C)   TempSrc: Oral   Resp: 16   Height: '5\' 6"'$  (1.676 m)   Weight: 144 lb (65.318 kg)   SpO2: 98%    Body mass index is 23.25 kg/(m^2).  General: WDWN female in NAD GAIT: normal Eyes: PERRLA Pulmonary: Non-labored respirations, CTAB Cardiac: regular rhythm,positive low grade murmur.  VASCULAR EXAM Carotid Bruits Left Right   negative negative  aorta is not palpable Radial pulses are 1+ palpable and equal.      LE Pulses LEFT RIGHT   POPLITEAL not palpable  not palpable   POSTERIOR TIBIAL not palpable  not palpable    DORSALIS PEDIS  ANTERIOR TIBIAL  palpable   palpable     Gastrointestinal: soft, nontender, BS WNL, no r/g, no palpable masses.  Musculoskeletal: Noe muscle atrophy/wasting. M/S 5/5 throughout, Extremities without ischemic changes.  Neurologic: A&O X 3; Appropriate Affect, Speech is normal CN 2-12 intact, Pain and light touch intact in extremities,  Motor exam as listed above.                Non-Invasive Vascular Imaging CAROTID DUPLEX 09/29/2015   Right ICA: 1 - 39 % stenosis. Left ICA: CEA site with 1 - 39 % stenosis. Bilateral vertebral artery is antegrade No significant change compared to exam of 09/27/14   Assessment: [name] is a 75 y.o. female who is s/p left CEA with Dacron patch on 07/04/13 for symptomatic left internal carotid stenosis with exophytic calcified plaque. Patient has a history of TIA (Feb., 2015), or stroke (Dec.,2014) symptoms as manifested by right hand and right facial tingling and numbness, expressive aphasia and confusion. Her previous neurologic deficits have resolved and she has not had any subsequent stroke or TIA.  Today's carotid duplex suggests minimal right ICA stenosis and the left ICA which is the CEA site with minimal restenosis.  No significant change compared to exam of 09/27/14.  Her atherosclerotic risk factors  include uncontrolled DM and active smoking. She takes a statin and Plavix.   Plan:  The patient was counseled re smoking cessation and given several free resources re smoking cessation.   Follow-up in 1 year with Carotid Duplex scan.   I discussed in depth with the patient the nature of atherosclerosis, and emphasized the importance of maximal medical management including strict control of blood pressure, blood glucose, and lipid levels, obtaining regular exercise, and cessation of smoking.  The patient is aware that without maximal medical management the underlying atherosclerotic disease process will progress, limiting the benefit of any interventions. The patient was given information about stroke prevention and what symptoms should prompt the patient to seek immediate medical care. Thank you for allowing [place] to participate in this patient's care.  Clemon Chambers, RN, MSN, FNP-C [facility] Office: [phone]  Clinic Physician:  Scot Dock  09/29/2015 11:07 AM

## 2015-09-30 NOTE — Addendum Note (Signed)
Addended by: Thresa Ross C on: 09/30/2015 11:39 AM   Modules accepted: Orders

## 2015-10-21 ENCOUNTER — Telehealth: Payer: Self-pay | Admitting: Family Medicine

## 2015-10-21 MED ORDER — ACCU-CHEK COMBO KIT: 1.0000 | PACK | Freq: Every day | Status: DC

## 2015-10-21 NOTE — Telephone Encounter (Signed)
Accu chek sent to pharmacy.

## 2015-10-21 NOTE — Telephone Encounter (Signed)
Rcvd request for Accu Chek kit Aviva Plus

## 2015-10-26 ENCOUNTER — Telehealth: Payer: Self-pay

## 2015-10-26 ENCOUNTER — Other Ambulatory Visit: Payer: Self-pay

## 2015-10-26 MED ORDER — ACCU-CHEK AVIVA PLUS W/DEVICE KIT: 1.0000 | PACK | Freq: Two times a day (BID) | Status: DC

## 2015-10-26 NOTE — Telephone Encounter (Signed)
Received faxed refill request for accu chek kit aviva plus, how often pt checks sugar is not listed on rx.

## 2015-10-26 NOTE — Telephone Encounter (Signed)
Resent in kit test BID

## 2015-10-31 ENCOUNTER — Telehealth: Payer: Self-pay | Admitting: Family Medicine

## 2015-10-31 ENCOUNTER — Other Ambulatory Visit: Payer: Self-pay

## 2015-10-31 MED ORDER — GLUCOSE BLOOD VI STRP: ORAL_STRIP | Status: DC

## 2015-10-31 MED ORDER — ACCU-CHEK AVIVA PLUS W/DEVICE KIT: 1.0000 | PACK | Freq: Two times a day (BID) | Status: DC

## 2015-10-31 MED ORDER — ACCU-CHEK SOFTCLIX LANCET DEV MISC: Status: DC

## 2015-10-31 NOTE — Telephone Encounter (Signed)
Recvd NEW PRESCRIPTION request for Accu Chek Kit Aviva Plus to NEW PHARMACY at [facility]

## 2015-10-31 NOTE — Telephone Encounter (Signed)
[facility]

## 2015-11-04 ENCOUNTER — Encounter: Payer: Self-pay | Admitting: Family Medicine

## 2015-11-04 ENCOUNTER — Ambulatory Visit (INDEPENDENT_AMBULATORY_CARE_PROVIDER_SITE_OTHER): Payer: Medicare Other | Admitting: Family Medicine

## 2015-11-04 VITALS — BP 130/78 | HR 68 | Ht 66.5 in | Wt 146.6 lb

## 2015-11-04 DIAGNOSIS — E1169 Type 2 diabetes mellitus with other specified complication: Secondary | ICD-10-CM

## 2015-11-04 DIAGNOSIS — E1140 Type 2 diabetes mellitus with diabetic neuropathy, unspecified: Secondary | ICD-10-CM

## 2015-11-04 DIAGNOSIS — I10 Essential (primary) hypertension: Secondary | ICD-10-CM | POA: Diagnosis not present

## 2015-11-04 DIAGNOSIS — E1159 Type 2 diabetes mellitus with other circulatory complications: Secondary | ICD-10-CM

## 2015-11-04 DIAGNOSIS — F17210 Nicotine dependence, cigarettes, uncomplicated: Secondary | ICD-10-CM

## 2015-11-04 DIAGNOSIS — E785 Hyperlipidemia, unspecified: Secondary | ICD-10-CM | POA: Diagnosis not present

## 2015-11-04 DIAGNOSIS — Z720 Tobacco use: Secondary | ICD-10-CM | POA: Diagnosis not present

## 2015-11-04 DIAGNOSIS — D126 Benign neoplasm of colon, unspecified: Secondary | ICD-10-CM

## 2015-11-04 LAB — POCT GLYCOSYLATED HEMOGLOBIN (HGB A1C): HEMOGLOBIN A1C: 8.2

## 2015-11-04 NOTE — Patient Instructions (Signed)
20 minutes of physical activity daily. In divided up during the day.

## 2015-11-04 NOTE — Progress Notes (Signed)
  Subjective:    Patient ID: [name], female    DOB: [birth year], 75 y.o.   MRN: [id number]  [name] is a 75 y.o. female who presents for follow-up of Type 2 diabetes mellitus.she also has had difficulty with [place] causing a yeast infection and stopped taking this. Patient is checking home blood sugars.   Home blood sugar records:machine messed up How often is blood sugars being checked: BID Current symptoms/problems none Daily foot checks: yes  Any foot concerns: feet turning purple Last eye exam: 04/2014 Exercise: none due to surgery She also recently had laparoscopic surgery and is recovering nicely from this. This has interfered with her ability to exercise regularly. The following portions of the patient's history were reviewed and updated as appropriate: allergies, current medications, past medical history, past social history and problem list.  ROS as in subjective above.     Objective:    Physical Exam Alert and in no distress otherwise not examined.    Lab Review Diabetic Labs Latest Ref Rng 09/05/2015 09/04/2015 09/03/2015 08/26/2015 07/05/2015  HbA1c - - - - - 8.0%  Chol 125 - 200 mg/dL - - - - -  HDL >=46 mg/dL - - - - -  Calc LDL <130 mg/dL - - - - -  Triglycerides <150 mg/dL - - - - -  Creatinine 0.44 - 1.00 mg/dL 0.94 0.86 0.94 0.92 -   BP/Weight 09/29/2015 09/06/2015 09/02/2015 08/26/2015 33/29/5188  Systolic BP 416 606 - 301 601  Diastolic BP 66 60 - 55 76  Wt. (Lbs) 144 - 150 150 152.6  BMI 23.25 - 24.22 24.22 25.39   Foot/eye exam completion dates Latest Ref Rng 03/08/2015 04/15/2014  Eye Exam No Retinopathy - No Retinopathy  Foot Form Completion - Done -   Hemoglobin A1c is 8.2  [name]  reports that she has been smoking Cigarettes.  She has been smoking about 0.20 packs per day. She has never used smokeless tobacco. She reports that she does not drink alcohol or use illicit drugs.     Assessment & Plan:    Type 2 diabetes mellitus with  diabetic neuropathy, without long-term current use of insulin ([facility]) - Plan: POCT glycosylated hemoglobin (Hb A1C)  Light smoker  Hypertension associated with diabetes ([place])  DM type 2 with diabetic dyslipidemia ([place])  Adenomatous colon polyp s/p laparoscopic right colectomy 09/02/15   1. Rx changes: none 2. Education: Reviewed 'ABCs' of diabetes management (respective goals in parentheses):  A1C (<7), blood pressure (<130/80), and cholesterol (LDL <100). 3. Compliance at present is estimated to be fair. Efforts to improve compliance (if necessary) will be directed at increased exercise. 4. Follow up: 4 months  5. Hopefully now that she can exercise more she can get her diabetes under better control. We'll also refer her to ophthalmology for an eye exam. Discussed the fact that we are running out of options concerning eventually placing her on insulin.

## 2015-11-07 ENCOUNTER — Telehealth: Payer: Self-pay | Admitting: *Deleted

## 2015-11-07 MED ORDER — LISINOPRIL-HYDROCHLOROTHIAZIDE 20-12.5 MG PO TABS: 1.0000 | ORAL_TABLET | Freq: Every day | ORAL | Status: DC

## 2015-11-07 MED ORDER — VERAPAMIL HCL ER 240 MG PO TBCR: 240.0000 mg | EXTENDED_RELEASE_TABLET | Freq: Every day | ORAL | Status: DC

## 2015-11-07 NOTE — Telephone Encounter (Signed)
Refills sent

## 2015-12-14 ENCOUNTER — Encounter: Payer: Self-pay | Admitting: Family Medicine

## 2015-12-14 ENCOUNTER — Ambulatory Visit (INDEPENDENT_AMBULATORY_CARE_PROVIDER_SITE_OTHER): Payer: Medicare Other | Admitting: Family Medicine

## 2015-12-14 VITALS — BP 122/70 | HR 88 | Temp 97.8°F | Wt 142.8 lb

## 2015-12-14 DIAGNOSIS — B9789 Other viral agents as the cause of diseases classified elsewhere: Principal | ICD-10-CM

## 2015-12-14 DIAGNOSIS — J069 Acute upper respiratory infection, unspecified: Secondary | ICD-10-CM

## 2015-12-14 NOTE — Progress Notes (Signed)
   Subjective:    Patient ID: [name], female    DOB: 03/02/1941, 75 y.o.   MRN: [id number]  HPI She complains of a one-week history that started with cough followed by sore throat started 2 days ago as well as malaise. No fever, chills, earache. She states that today she does feel slightly better.   Review of Systems     Objective:   Physical Exam Alert and in no distress. Tympanic membranes and canals are normal. Pharyngeal area is normal. Neck is supple without adenopathy or thyromegaly. Cardiac exam shows a regular sinus rhythm without murmurs or gallops. Lungs are clear to auscultation.        Assessment & Plan:  Viral URI with cough Since she seems to be getting better, decided to wait. If her symptoms worsen, she was instructed to call me and I'll place her on an antibiotic.

## 2016-02-17 ENCOUNTER — Other Ambulatory Visit: Payer: Self-pay

## 2016-02-17 ENCOUNTER — Telehealth: Payer: Self-pay | Admitting: Family Medicine

## 2016-02-17 ENCOUNTER — Other Ambulatory Visit: Payer: Self-pay | Admitting: Family Medicine

## 2016-02-17 DIAGNOSIS — Z9889 Other specified postprocedural states: Secondary | ICD-10-CM

## 2016-02-17 MED ORDER — LISINOPRIL-HYDROCHLOROTHIAZIDE 20-12.5 MG PO TABS: 1.0000 | ORAL_TABLET | Freq: Every day | ORAL | 3 refills | Status: DC

## 2016-02-17 MED ORDER — CLOPIDOGREL BISULFATE 75 MG PO TABS: 75.0000 mg | ORAL_TABLET | Freq: Every day | ORAL | 0 refills | Status: DC

## 2016-02-17 MED ORDER — CLOPIDOGREL BISULFATE 75 MG PO TABS: 75.0000 mg | ORAL_TABLET | Freq: Every day | ORAL | 3 refills | Status: DC

## 2016-02-17 NOTE — Telephone Encounter (Signed)
Pt called and states that she took her last plavix pill this morning and was wondering if she could get a few pills sent to the [place] [place] till her other ones come in [facility] said if you could please call her after this was done so she go get  At [phone]

## 2016-02-17 NOTE — Telephone Encounter (Signed)
[facility]

## 2016-02-17 NOTE — Telephone Encounter (Signed)
Sent 30 days in

## 2016-02-17 NOTE — Telephone Encounter (Signed)
Rcvd refill request for Lisinopril, Clopidogrel, Veapamil to NEW PH[facility]Y at [facility]

## 2016-02-21 ENCOUNTER — Other Ambulatory Visit: Payer: Self-pay

## 2016-02-21 ENCOUNTER — Telehealth: Payer: Self-pay | Admitting: Family Medicine

## 2016-02-21 MED ORDER — VERAPAMIL HCL ER 240 MG PO TBCR: 240.0000 mg | EXTENDED_RELEASE_TABLET | Freq: Every day | ORAL | 3 refills | Status: DC

## 2016-02-21 NOTE — Telephone Encounter (Signed)
Rcvd refill request for Verapamil to NEW PHARMACY at [facility]

## 2016-02-21 NOTE — Telephone Encounter (Signed)
Med sent in.

## 2016-02-21 NOTE — Telephone Encounter (Signed)
Sent in B/P med to [facility]

## 2016-03-07 ENCOUNTER — Ambulatory Visit (INDEPENDENT_AMBULATORY_CARE_PROVIDER_SITE_OTHER): Payer: Medicare Other | Admitting: Family Medicine

## 2016-03-07 ENCOUNTER — Encounter: Payer: Self-pay | Admitting: Family Medicine

## 2016-03-07 VITALS — BP 140/70 | HR 74 | Ht 67.0 in | Wt 146.0 lb

## 2016-03-07 DIAGNOSIS — E1169 Type 2 diabetes mellitus with other specified complication: Secondary | ICD-10-CM

## 2016-03-07 DIAGNOSIS — Z720 Tobacco use: Secondary | ICD-10-CM

## 2016-03-07 DIAGNOSIS — E1136 Type 2 diabetes mellitus with diabetic cataract: Secondary | ICD-10-CM

## 2016-03-07 DIAGNOSIS — E785 Hyperlipidemia, unspecified: Secondary | ICD-10-CM | POA: Diagnosis not present

## 2016-03-07 DIAGNOSIS — F17210 Nicotine dependence, cigarettes, uncomplicated: Secondary | ICD-10-CM

## 2016-03-07 DIAGNOSIS — I10 Essential (primary) hypertension: Secondary | ICD-10-CM | POA: Diagnosis not present

## 2016-03-07 DIAGNOSIS — E1159 Type 2 diabetes mellitus with other circulatory complications: Secondary | ICD-10-CM

## 2016-03-07 DIAGNOSIS — I152 Hypertension secondary to endocrine disorders: Secondary | ICD-10-CM

## 2016-03-07 LAB — POCT UA - MICROALBUMIN
Albumin/Creatinine Ratio, Urine, POC: 11.7
CREATININE, POC: 49.6 mg/dL
MICROALBUMIN (UR) POC: 5.8 mg/L

## 2016-03-07 LAB — POCT GLYCOSYLATED HEMOGLOBIN (HGB A1C): HEMOGLOBIN A1C: 7.7

## 2016-03-07 NOTE — Progress Notes (Signed)
  Subjective:    Patient ID: [name], female    DOB: 03/28/1941, 75 y.o.   MRN: [id number]  [name] is a 75 y.o. female who presents for follow-up of Type 2 diabetes mellitus.  Patient is checking home blood sugars.   Home blood sugar records:132 to 200 How often is blood sugars being checked: BID Current symptoms/problems just being high. She has started exercising more. Daily foot checks: yes   Any foot concerns: none Last eye exam: canceled appointment but has one next month Exercise: walking every morning and every evening  The following portions of the patient's history were reviewed and updated as appropriate: allergies, current medications, past medical history, past social history and problem list.  ROS as in subjective above.     Objective:    Physical Exam Alert and in no distress otherwise not examined.   Lab Review Diabetic Labs Latest Ref Rng & Units 11/04/2015 09/05/2015 09/04/2015 09/03/2015 08/26/2015  HbA1c - 8.2 - - - -  Chol 125 - 200 mg/dL - - - - -  HDL >=46 mg/dL - - - - -  Calc LDL <130 mg/dL - - - - -  Triglycerides <150 mg/dL - - - - -  Creatinine 0.44 - 1.00 mg/dL - 0.94 0.86 0.94 0.92   BP/Weight 12/14/2015 11/04/2015 09/29/2015 09/06/2015 9/45/8592  Systolic BP 924 462 863 817 -  Diastolic BP 70 78 66 60 -  Wt. (Lbs) 142.8 146.6 144 - 150  BMI 22.71 23.31 23.25 - 24.22   Foot/eye exam completion dates Latest Ref Rng & Units 03/08/2015 04/15/2014  Eye Exam No Retinopathy - No Retinopathy  Foot Form Completion - Done -  Hemoglobin A1c is 7.7  [name]  reports that she has been smoking Cigarettes.  She has been smoking about 0.20 packs per day. She has never used smokeless tobacco. She reports that she does not drink alcohol or use drugs.     Assessment & Plan:    Hypertension associated with diabetes ([place])  Cataract associated with type 2 diabetes mellitus ([place])  DM type 2 with diabetic dyslipidemia ([place]) - Plan: POCT UA -  Microalbumin, HgB A1c  Light smoker  Hyperlipidemia associated with type 2 diabetes mellitus ([place])   1. Rx changes: none 2. Education: Reviewed 'ABCs' of diabetes management (respective goals in parentheses):  A1C (<7), blood pressure (<130/80), and cholesterol (LDL <100). 3. Compliance at present is estimated to be good. Efforts to improve compliance (if necessary) will be directed at increased exercise. Follow up: 4 year also discussed making further changes in her diet especially in regard to carbohydrates. Congratulated her on being able to get her A1c down. Discussed smoking cessation but again she is not interested in quitting

## 2016-03-18 ENCOUNTER — Other Ambulatory Visit: Payer: Self-pay | Admitting: Family Medicine

## 2016-03-18 DIAGNOSIS — Z9889 Other specified postprocedural states: Secondary | ICD-10-CM

## 2016-03-20 ENCOUNTER — Other Ambulatory Visit: Payer: Self-pay | Admitting: Family Medicine

## 2016-04-17 ENCOUNTER — Other Ambulatory Visit: Payer: Self-pay | Admitting: Family Medicine

## 2016-04-23 ENCOUNTER — Other Ambulatory Visit: Payer: Self-pay | Admitting: Family Medicine

## 2016-05-18 ENCOUNTER — Other Ambulatory Visit (INDEPENDENT_AMBULATORY_CARE_PROVIDER_SITE_OTHER): Payer: Medicare Other

## 2016-05-18 DIAGNOSIS — Z23 Encounter for immunization: Secondary | ICD-10-CM | POA: Diagnosis not present

## 2016-07-05 ENCOUNTER — Encounter: Payer: Self-pay | Admitting: Family Medicine

## 2016-07-05 ENCOUNTER — Ambulatory Visit (INDEPENDENT_AMBULATORY_CARE_PROVIDER_SITE_OTHER): Payer: Medicare Other | Admitting: Family Medicine

## 2016-07-05 VITALS — BP 120/70 | HR 70 | Ht 67.0 in | Wt 147.2 lb

## 2016-07-05 DIAGNOSIS — E1169 Type 2 diabetes mellitus with other specified complication: Secondary | ICD-10-CM | POA: Diagnosis not present

## 2016-07-05 DIAGNOSIS — I10 Essential (primary) hypertension: Secondary | ICD-10-CM

## 2016-07-05 DIAGNOSIS — E785 Hyperlipidemia, unspecified: Secondary | ICD-10-CM

## 2016-07-05 DIAGNOSIS — I152 Hypertension secondary to endocrine disorders: Secondary | ICD-10-CM

## 2016-07-05 DIAGNOSIS — E1159 Type 2 diabetes mellitus with other circulatory complications: Secondary | ICD-10-CM

## 2016-07-05 DIAGNOSIS — F17210 Nicotine dependence, cigarettes, uncomplicated: Secondary | ICD-10-CM | POA: Diagnosis not present

## 2016-07-05 LAB — POCT GLYCOSYLATED HEMOGLOBIN (HGB A1C): HEMOGLOBIN A1C: 7.9

## 2016-07-05 NOTE — Progress Notes (Signed)
  Subjective:    Patient ID: [name], female    DOB: [birth year], 75 y.o.   MRN: [id number]  [name] is a 75 y.o. female who presents for follow-up of Type 2 diabetes mellitus.  Patient is checking home blood sugars.   Home blood sugar records: 37 to 200 this morning 70 How often is blood sugars being checked: BID Current symptoms/problems none Daily foot checks yes  Any foot concerns:none Last eye exam: pt has not gone due to expense  Exercise: house work She continues on lisinopril/verapamil. She also takes Janumet and Crestor. She is having no difficulty with any these medications. She has been having difficulty with a cough recently but this is slowly getting better. She continues to smoke. The following portions of the patient's history were reviewed and updated as appropriate: allergies, current medications, past medical history, past social history and problem list.  ROS as in subjective above.     Objective:    Physical Exam Alert and in no distress otherwise not examined.   Lab Review Diabetic Labs Latest Ref Rng & Units 03/07/2016 11/04/2015 09/05/2015 09/04/2015 09/03/2015  HbA1c - 7.7 8.2 - - -  Microalbumin mg/L 5.8 - - - -  Micro/Creat Ratio - 11.7 - - - -  Chol 125 - 200 mg/dL - - - - -  HDL >=46 mg/dL - - - - -  Calc LDL <130 mg/dL - - - - -  Triglycerides <150 mg/dL - - - - -  Creatinine 0.44 - 1.00 mg/dL - - 0.94 0.86 0.94   BP/Weight 03/07/2016 12/14/2015 11/04/2015 09/29/2015 3/36/1224  Systolic BP 497 530 051 102 111  Diastolic BP 70 70 78 66 60  Wt. (Lbs) 146 142.8 146.6 144 -  BMI 22.87 22.71 23.31 23.25 -   Foot/eye exam completion dates Latest Ref Rng & Units 03/08/2015 04/15/2014  Eye Exam No Retinopathy - No Retinopathy  Foot Form Completion - Done -   [place]  reports that she has been smoking Cigarettes.  She has been smoking about 0.20 packs per day. She has never used smokeless tobacco. She reports that she does not drink alcohol or use  drugs. A1c is 7.9    Assessment & Plan:    DM type 2 with diabetic dyslipidemia ([place]) - Plan: HgB A1c  Hypertension associated with diabetes ([place])  Light smoker  Hyperlipidemia associated with type 2 diabetes mellitus ([place])    1. Rx changes: none 2. Education: Reviewed 'ABCs' of diabetes management (respective goals in parentheses):  A1C (<7), blood pressure (<130/80), and cholesterol (LDL <100). 3. Compliance at present is estimated to be fair. Efforts to improve compliance (if necessary) will be directed at increased exercise. 4. Follow up: 4 months

## 2016-07-09 ENCOUNTER — Other Ambulatory Visit: Payer: Self-pay | Admitting: Family Medicine

## 2016-08-29 ENCOUNTER — Other Ambulatory Visit: Payer: Self-pay | Admitting: Family Medicine

## 2016-08-29 DIAGNOSIS — E785 Hyperlipidemia, unspecified: Secondary | ICD-10-CM

## 2016-08-29 DIAGNOSIS — E1169 Type 2 diabetes mellitus with other specified complication: Secondary | ICD-10-CM

## 2016-09-21 ENCOUNTER — Encounter: Payer: Self-pay | Admitting: Family

## 2016-10-02 ENCOUNTER — Other Ambulatory Visit: Payer: Self-pay | Admitting: Family Medicine

## 2016-10-04 ENCOUNTER — Ambulatory Visit (INDEPENDENT_AMBULATORY_CARE_PROVIDER_SITE_OTHER): Payer: Medicare Other | Admitting: Family

## 2016-10-04 ENCOUNTER — Ambulatory Visit (HOSPITAL_COMMUNITY)
Admission: RE | Admit: 2016-10-04 | Discharge: 2016-10-04 | Disposition: A | Discharge status: Home / Self Care | Payer: Medicare Other | Source: Ambulatory Visit | Attending: Family | Admitting: Family

## 2016-10-04 ENCOUNTER — Encounter: Payer: Self-pay | Admitting: Family

## 2016-10-04 VITALS — BP 161/72 | HR 84 | Temp 97.4°F | Resp 20 | Ht 67.0 in | Wt 149.3 lb

## 2016-10-04 DIAGNOSIS — Z8673 Personal history of transient ischemic attack (TIA), and cerebral infarction without residual deficits: Secondary | ICD-10-CM | POA: Insufficient documentation

## 2016-10-04 DIAGNOSIS — I6522 Occlusion and stenosis of left carotid artery: Secondary | ICD-10-CM | POA: Insufficient documentation

## 2016-10-04 DIAGNOSIS — F17200 Nicotine dependence, unspecified, uncomplicated: Secondary | ICD-10-CM | POA: Diagnosis not present

## 2016-10-04 DIAGNOSIS — Z48812 Encounter for surgical aftercare following surgery on the circulatory system: Secondary | ICD-10-CM | POA: Diagnosis not present

## 2016-10-04 LAB — VAS US CAROTID
LCCAPSYS: 77 cm/s
LEFT ECA DIAS: -12 cm/s
Left CCA dist dias: 19 cm/s
Left CCA dist sys: 69 cm/s
Left CCA prox dias: 20 cm/s
Left ICA dist dias: -29 cm/s
Left ICA dist sys: -98 cm/s
Left ICA prox dias: -27 cm/s
Left ICA prox sys: -107 cm/s
RCCADSYS: -83 cm/s
RCCAPDIAS: 12 cm/s
RIGHT CCA MID DIAS: 16 cm/s
RIGHT ECA DIAS: -8 cm/s
Right CCA prox sys: 76 cm/s

## 2016-10-04 NOTE — Progress Notes (Signed)
Chief Complaint: Follow up Extracranial Carotid Artery Stenosis   History of Present Illness  [name] is a 76 y.o. female patient of Dr. Oneida Alar who is s/p left CEA with Dacron patch on 07/04/13 for symptomatic left internal carotid stenosis with exophytic calcified plaque. She returns today for follow up.  Patient has a history of TIA (Feb., 2015), or stroke (Dec.,2014) symptoms as manifested by right hand and right facial tingling and numbness, expressive aphasia and confusion;  denies weakness on right side, denies monocular vision loss, denies unilateral facial drooping.  The patient's previous neurologic deficits are resolved.  She has not had any subsequent stroke or TIA.  Pt denies clauduaction symptoms with walking, denies non healing wounds, denies any known cardiac problems. She has left knee issues after falling on her left knee and had surgery years ago. Pt states she walks a great deal.  She had a partial colectomy in February 2017 for polyps that were benign. Shehad sepsis from c.diff, January, 2015.  Her husband was diagnosed recently with cancer of the esophagus with mets to liver.   Pt Diabetic: Yes, A1C was 7.9 on 07-05-16 (review of records) Pt smoker: smoker (1/3 ppd, started at age 76 yrs)  Pt meds include: Statin : Yes ASA: No Other anticoagulants/antiplatelets: Plavix, started Feb., 2015 when she had the TIA, prescribed by Dr. Leonie Man; pt states she was discharged by Dr. Leonie Man in 2016 and to follow up prn   Past Medical History:  Diagnosis Date  . Carotid artery occlusion   . Clostridium difficile infection 06/2013  . Diabetes mellitus    takes Janumet daily  . Dyslipidemia    takes Crestor daily  . Gallstones   . GERD (gastroesophageal reflux disease)   . Heart murmur    hx of  . Hypertension    takes Prinzide and Verapamil daily  . Impaired speech    from stroke  . Neuropathy, diabetic ([place])   . Pneumonia   . PONV (postoperative  nausea and vomiting)   . Smoker   . Stroke ([place]) 06/25/13  . Vertigo    but doesn't take any meds    Social History Social History  Substance Use Topics  . Smoking status: Current Some Day Smoker    Packs/day: 0.20    Types: Cigarettes  . Smokeless tobacco: Never Used  . Alcohol use No    Family History Family History  Problem Relation Age of Onset  . Leukemia Mother   . Diabetes Mother   . Cancer Father     esophageal ca  . Heart disease Father   . Diabetes Maternal Aunt   . Diabetes Maternal Uncle   . Diabetes Brother   . Diabetes Sister   . Heart attack Brother     x 2    Surgical History Past Surgical History:  Procedure Laterality Date  . cataract removed Right   . CHOLECYSTECTOMY    . ENDARTERECTOMY Left 07/14/2013   Procedure: ENDARTERECTOMY CAROTID-LEFT;  Surgeon: Elam Dutch, MD;  Location: [facility];  Service: Vascular;  Laterality: Left;  . KNEE SURGERY Left 14yr ago  . LAPAROSCOPIC PARTIAL COLECTOMY N/A 09/02/2015   Procedure: LAPAROSCOPIC PARTIAL RIGHT COLECTOMY;  Surgeon: ALeighton Ruff MD;  Location: [facility];  Service: General;  Laterality: N/A;  . PATCH ANGIOPLASTY Left 07/14/2013   Procedure: LEFT CAROTID ARTERY PATCH ANGIOPLASTY;  Surgeon: CElam Dutch MD;  Location[facility]  Service: Vascular;  Laterality: Left;    Allergies  Allergen Reactions  . Codeine Nausea And Vomiting  . Lipitor [Atorvastatin] Nausea And Vomiting  . Phenergan [Promethazine Hcl] Other (See Comments)    Confusion, hallucinations, severe aggitation    Current Outpatient Prescriptions  Medication Sig Dispense Refill  . ACCU-CHEK AVIVA PLUS test strip CHECK BLOOD SUGAR TWO TIMES DAILY 200 each 3  . Blood Glucose Monitoring Suppl (ACCU-CHEK AVIVA PLUS) w/Device KIT 1 kit by Does not apply route 2 (two) times daily. 1 kit 0  . clopidogrel (PLAVIX) 75 MG tablet take 1 tablet by mouth once daily 30 tablet 0  . JANUMET 50-1000 MG tablet take 1 tablet twice a day with  food 180 tablet 1  . Lancet Devices (ACCU-CHEK SOFTCLIX) lancets Test blood sugar BID daily 300 each 3  . lisinopril-hydrochlorothiazide (PRINZIDE,ZESTORETIC) 20-12.5 MG tablet TAKE 1 TABLET BY MOUTH  DAILY 90 tablet 3  . rosuvastatin (CRESTOR) 20 MG tablet take 1 tablet by mouth once daily 90 tablet 0  . verapamil (CALAN-SR) 240 MG CR tablet Take 1 tablet (240 mg total) by mouth daily. 90 tablet 3   No current facility-administered medications for this visit.     Review of Systems : See HPI for pertinent positives and negatives.  Physical Examination  Vitals:   10/04/16 1323 10/04/16 1325  BP: (!) 144/83 (!) 161/72  Pulse: 84   Resp: 20   Temp: 97.4 F (36.3 C)   TempSrc: Oral   SpO2: 98%   Weight: 149 lb 4.8 oz (67.7 kg)   Height: _0  (1.702 m)    Body mass index is 23.38 kg/m.  General: WDWN female in NAD GAIT: normal Eyes: PERRLA Pulmonary: Non-labored respirations, CTAB, but limited air movement in posterior fields. Cardiac: regular rhythm,positive low grade murmur.  VASCULAR EXAM Carotid Bruits Left Right   negative negative  aorta is not palpable Radial pulses are 1+ palpable and equal.      LE Pulses LEFT RIGHT   POPLITEAL not palpable  not palpable   POSTERIOR TIBIAL not palpable   palpable    DORSALIS PEDIS  ANTERIOR TIBIAL  not palpable   not palpable     Gastrointestinal: soft, nontender, BS WNL, no r/g, no palpable masses.  Musculoskeletal: Noe muscle atrophy/wasting. M/S 5/5 throughout, Extremities without ischemic changes.  Neurologic: A&O X 3; Appropriate Affect, Speech is normal CN 2-12 intact, Pain and light touch intact in extremities, Motor exam as listed above     Assessment: [name] is a 76 y.o. female who is s/p left CEA with  Dacron patch on 07/04/13 for symptomatic left internal carotid stenosis with exophytic calcified plaque. Patient has a history of TIA (Feb., 2015), or stroke (Dec.,2014) symptoms as manifested by right hand and right facial tingling and numbness, expressive aphasia and confusion. Her previous neurologic deficits have resolved and she has not had any subsequent stroke or TIA.  Her atherosclerotic risk factors include uncontrolled DM and active smoking. She takes a statin and Plavix.   DATA Today's carotid duplex suggests <40% right ICA stenosis and the left ICA which is the CEA site with <40% restenosis.  Bilateral vertebral artery flow is antegrade.  Bilateral subclavian artery waveforms are normal.  No significant change compared to exam of 09-29-15.    Plan:  The patient was counseled re smoking cessation and given several free resources re smoking cessation.   Follow-up in 1 year with Carotid Duplex scan.    I discussed in depth with the patient the nature of atherosclerosis,  and emphasized the importance of maximal medical management including strict control of blood pressure, blood glucose, and lipid levels, obtaining regular exercise, and cessation of smoking.  The patient is aware that without maximal medical management the underlying atherosclerotic disease process will progress, limiting the benefit of any interventions. The patient was given information about stroke prevention and what symptoms should prompt the patient to seek immediate medical care. Thank you for allowing [place] to participate in this patient's care.  Clemon Chambers, RN, MSN, FNP-C [facility] Office: [place]: Oneida Alar  10/04/16 1:25 PM

## 2016-10-04 NOTE — Patient Instructions (Signed)
Stroke Prevention Some medical conditions and behaviors are associated with an increased chance of having a stroke. You may prevent a stroke by making healthy choices and managing medical conditions. How can I reduce my risk of having a stroke?  Stay physically active. Get at least 30 minutes of activity on most or all days.  Do not smoke. It may also be helpful to avoid exposure to secondhand smoke.  Limit alcohol use. Moderate alcohol use is considered to be:  No more than 2 drinks per day for men.  No more than 1 drink per day for nonpregnant women.  Eat healthy foods. This involves:  Eating 5 or more servings of fruits and vegetables a day.  Making dietary changes that address high blood pressure (hypertension), high cholesterol, diabetes, or obesity.  Manage your cholesterol levels.  Making food choices that are high in fiber and low in saturated fat, trans fat, and cholesterol may control cholesterol levels.  Take any prescribed medicines to control cholesterol as directed by your health care provider.  Manage your diabetes.  Controlling your carbohydrate and sugar intake is recommended to manage diabetes.  Take any prescribed medicines to control diabetes as directed by your health care provider.  Control your hypertension.  Making food choices that are low in salt (sodium), saturated fat, trans fat, and cholesterol is recommended to manage hypertension.  Ask your health care provider if you need treatment to lower your blood pressure. Take any prescribed medicines to control hypertension as directed by your health care provider.  If you are 18-39 years of age, have your blood pressure checked every 3-5 years. If you are 40 years of age or older, have your blood pressure checked every year.  Maintain a healthy weight.  Reducing calorie intake and making food choices that are low in sodium, saturated fat, trans fat, and cholesterol are recommended to manage  weight.  Stop drug abuse.  Avoid taking birth control pills.  Talk to your health care provider about the risks of taking birth control pills if you are over 35 years old, smoke, get migraines, or have ever had a blood clot.  Get evaluated for sleep disorders (sleep apnea).  Talk to your health care provider about getting a sleep evaluation if you snore a lot or have excessive sleepiness.  Take medicines only as directed by your health care provider.  For some people, aspirin or blood thinners (anticoagulants) are helpful in reducing the risk of forming abnormal blood clots that can lead to stroke. If you have the irregular heart rhythm of atrial fibrillation, you should be on a blood thinner unless there is a good reason you cannot take them.  Understand all your medicine instructions.  Make sure that other conditions (such as anemia or atherosclerosis) are addressed. Get help right away if:  You have sudden weakness or numbness of the face, arm, or leg, especially on one side of the body.  Your face or eyelid droops to one side.  You have sudden confusion.  You have trouble speaking (aphasia) or understanding.  You have sudden trouble seeing in one or both eyes.  You have sudden trouble walking.  You have dizziness.  You have a loss of balance or coordination.  You have a sudden, severe headache with no known cause.  You have new chest pain or an irregular heartbeat. Any of these symptoms may represent a serious problem that is an emergency. Do not wait to see if the symptoms will go away.   Get medical help at once. Call your local emergency services (911 in [place] Do not drive yourself to the hospital. This information is not intended to replace advice given to you by your health care provider. Make sure you discuss any questions you have with your health care provider. Document Released: 08/09/2004 Document Revised: 12/08/2015 Document Reviewed: 01/02/2013 Elsevier  Interactive Patient Education  2017 Elsevier Inc.      Preventing Cerebrovascular Disease Arteries are blood vessels that carry blood that contains oxygen from the heart to all parts of the body. Cerebrovascular disease affects arteries that supply the brain. Any condition that blocks or disrupts blood flow to the brain can cause cerebrovascular disease. Brain cells that lose blood supply start to die within minutes (stroke). Stroke is the main danger of cerebrovascular disease. Atherosclerosis and high blood pressure are common causes of cerebrovascular disease. Atherosclerosis is narrowing and hardening of an artery that results when fat, cholesterol, calcium, or other substances (plaque) build up inside an artery. Plaque reduces blood flow through the artery. High blood pressure increases the risk of bleeding inside the brain. Making diet and lifestyle changes to prevent atherosclerosis and high blood pressure lowers your risk of cerebrovascular disease. What nutrition changes can be made?  Eat more fruits, vegetables, and whole grains.  Reduce how much saturated fat you eat. To do this, eat less red meat and fewer full-fat dairy products.  Eat healthy proteins instead of red meat. Healthy proteins include:  Fish. Eat fish that contains heart-healthy omega-3 fatty acids, twice a week. Examples include salmon, albacore tuna, mackerel, and herring.  Chicken.  Nuts.  Low-fat or nonfat yogurt.  Avoid processed meats, like bacon and lunchmeat.  Avoid foods that contain:  A lot of sugar, such as sweets and drinks with added sugar.  A lot of salt (sodium). Avoid adding extra salt to your food, as told by your health care provider.  Trans fats, such as margarine and baked goods. Trans fats may be listed as "partially hydrogenated oils" on food labels.  Check food labels to see how much sodium, sugar, and trans fats are in foods.  Use vegetable oils that contain low amounts of  saturated fat, such as olive oil or canola oil. What lifestyle changes can be made?  Drink alcohol in moderation. This means no more than 1 drink a day for nonpregnant women and 2 drinks a day for men. One drink equals 12 oz of beer, 5 oz of wine, or 1 oz of hard liquor.  If you are overweight, ask your health care provider to recommend a weight-loss plan for you. Losing 5-10 lb (2.2-4.5 kg) can reduce your risk of diabetes, atherosclerosis, and high blood pressure.  Exercise for 30?60 minutes on most days, or as much as told by your health care provider.  Do moderate-intensity exercise, such as brisk walking, bicycling, and water aerobics. Ask your health care provider which activities are safe for you.  Do not use any products that contain nicotine or tobacco, such as cigarettes and e-cigarettes. If you need help quitting, ask your health care provider. Why are these changes important? Making these changes lowers your risk of many diseases that can cause cerebrovascular disease and stroke. Stroke is a leading cause of death and disability. Making these changes also improves your overall health and quality of life. What can I do to lower my risk? The following factors make you more likely to develop cerebrovascular disease:  Being overweight.  Smoking.  Being physically   inactive.  Eating a high-fat diet.  Having certain health conditions, such as:  Diabetes.  High blood pressure.  Heart disease.  Atherosclerosis.  High cholesterol.  Sickle cell disease. Talk with your health care provider about your risk for cerebrovascular disease. Work with your health care provider to control diseases that you have that may contribute to cerebrovascular disease. Your health care provider may prescribe medicines to help prevent major causes of cerebrovascular disease. Where to find more information: Learn more about preventing cerebrovascular disease from:  [place], Lung, and  [place]: MoAnalyst.de  Centers for Disease Control and Prevention: http://www.curry-wood.biz/ Summary  Cerebrovascular disease can lead to a stroke.  Atherosclerosis and high blood pressure are major causes of cerebrovascular disease.  Making diet and lifestyle changes can reduce your risk of cerebrovascular disease.  Work with your health care provider to get your risk factors under control to reduce your risk of cerebrovascular disease. This information is not intended to replace advice given to you by your health care provider. Make sure you discuss any questions you have with your health care provider. Document Released: 07/17/2015 Document Revised: 01/20/2016 Document Reviewed: 07/17/2015 Elsevier Interactive Patient Education  2017 [employer].      Steps to Quit Smoking Smoking tobacco can be bad for your health. It can also affect almost every organ in your body. Smoking puts you and people around you at risk for many serious long-lasting (chronic) diseases. Quitting smoking is hard, but it is one of the best things that you can do for your health. It is never too late to quit. What are the benefits of quitting smoking? When you quit smoking, you lower your risk for getting serious diseases and conditions. They can include:  Lung cancer or lung disease.  Heart disease.  Stroke.  Heart attack.  Not being able to have children (infertility).  Weak bones (osteoporosis) and broken bones (fractures). If you have coughing, wheezing, and shortness of breath, those symptoms may get better when you quit. You may also get sick less often. If you are pregnant, quitting smoking can help to lower your chances of having a baby of low birth weight. What can I do to help me quit smoking? Talk with your doctor about what can help you quit smoking. Some things you can do (strategies) include:  Quitting smoking totally, instead of slowly  cutting back how much you smoke over a period of time.  Going to in-person counseling. You are more likely to quit if you go to many counseling sessions.  Using resources and support systems, such as:  Online chats with a Social worker.  Phone quitlines.  Printed Furniture conservator/restorer.  Support groups or group counseling.  Text messaging programs.  Mobile phone apps or applications.  Taking medicines. Some of these medicines may have nicotine in them. If you are pregnant or breastfeeding, do not take any medicines to quit smoking unless your doctor says it is okay. Talk with your doctor about counseling or other things that can help you. Talk with your doctor about using more than one strategy at the same time, such as taking medicines while you are also going to in-person counseling. This can help make quitting easier. What things can I do to make it easier to quit? Quitting smoking might feel very hard at first, but there is a lot that you can do to make it easier. Take these steps:  Talk to your family and friends. Ask them to support and encourage you.  Call phone quitlines, reach out to support groups, or work with a Social worker.  Ask people who smoke to not smoke around you.  Avoid places that make you want (trigger) to smoke, such as:  Bars.  Parties.  Smoke-break areas at work.  Spend time with people who do not smoke.  Lower the stress in your life. Stress can make you want to smoke. Try these things to help your stress:  Getting regular exercise.  Deep-breathing exercises.  Yoga.  Meditating.  Doing a body scan. To do this, close your eyes, focus on one area of your body at a time from head to toe, and notice which parts of your body are tense. Try to relax the muscles in those areas.  Download or buy apps on your mobile phone or tablet that can help you stick to your quit plan. There are many free apps, such as QuitGuide from the [employer] Office manager for Disease Control  and Prevention). You can find more support from smokefree.gov and other websites. This information is not intended to replace advice given to you by your health care provider. Make sure you discuss any questions you have with your health care provider. Document Released: 04/28/2009 Document Revised: 02/28/2016 Document Reviewed: 11/16/2014 Elsevier Interactive Patient Education  2017 [employer].

## 2016-10-10 NOTE — Addendum Note (Signed)
Addended by: Lianne Cure A on: 10/10/2016 02:01 PM   Modules accepted: Orders

## 2016-11-05 ENCOUNTER — Ambulatory Visit: Payer: Medicare Other | Admitting: Family Medicine

## 2016-11-12 ENCOUNTER — Ambulatory Visit (INDEPENDENT_AMBULATORY_CARE_PROVIDER_SITE_OTHER): Payer: Medicare Other | Admitting: Family Medicine

## 2016-11-12 ENCOUNTER — Encounter: Payer: Self-pay | Admitting: Family Medicine

## 2016-11-12 VITALS — BP 120/70 | HR 88 | Ht 67.0 in | Wt 147.0 lb

## 2016-11-12 DIAGNOSIS — E1169 Type 2 diabetes mellitus with other specified complication: Secondary | ICD-10-CM

## 2016-11-12 DIAGNOSIS — F17210 Nicotine dependence, cigarettes, uncomplicated: Secondary | ICD-10-CM | POA: Diagnosis not present

## 2016-11-12 DIAGNOSIS — E1159 Type 2 diabetes mellitus with other circulatory complications: Secondary | ICD-10-CM | POA: Diagnosis not present

## 2016-11-12 DIAGNOSIS — Z8673 Personal history of transient ischemic attack (TIA), and cerebral infarction without residual deficits: Secondary | ICD-10-CM

## 2016-11-12 DIAGNOSIS — I10 Essential (primary) hypertension: Secondary | ICD-10-CM

## 2016-11-12 DIAGNOSIS — M25511 Pain in right shoulder: Secondary | ICD-10-CM

## 2016-11-12 DIAGNOSIS — E785 Hyperlipidemia, unspecified: Secondary | ICD-10-CM

## 2016-11-12 LAB — POCT GLYCOSYLATED HEMOGLOBIN (HGB A1C): HEMOGLOBIN A1C: 8.3

## 2016-11-12 NOTE — Progress Notes (Signed)
Subjective:    Patient ID: [name], female    DOB: 1940/07/23, 76 y.o.   MRN: [id number]  [name] is a 76 y.o. female who presents for follow-up of Type 2 diabetes mellitus.  Patient is checking home blood sugars.   Home blood sugar records: 127 to 214 How often is blood sugars being checked: BID Current symptoms/problems ; sugars higher in the mornings Daily foot checks: yes  Any foot concerns: none Last eye exam: appointment next month Dr.Groat Exercise: walking some 2 x week  Her eating habits are unchanged. She does continue to smoke and again is not interested in quitting. She continues on her Crestor with no aches and pains. She also is taking lisinopril as well as Plavix and verapamil. She does complain of right shoulder pain for the last week with no history of injury. She reports the anterior portion of her shoulder. The following portions of the patient's history were reviewed and updated as appropriate: allergies, current medications, past medical history, past social history and problem list.  ROS as in subjective above.     Objective:    Physical Exam Alert and in no distress Right shoulder exam shows slight tenderness to palpation near the occipital groove. Good motion of the shoulder. Negative drop arm test.. No shoulder laxity noted. Neer's and Hawkins test was difficult to do. Lab Review Diabetic Labs Latest Ref Rng & Units 11/12/2016 07/05/2016 03/07/2016 11/04/2015 09/05/2015  HbA1c - 8.3 7.9 7.7 8.2 -  Microalbumin mg/L - - 5.8 - -  Micro/Creat Ratio - - - 11.7 - -  Chol 125 - 200 mg/dL - - - - -  HDL >=46 mg/dL - - - - -  Calc LDL <130 mg/dL - - - - -  Triglycerides <150 mg/dL - - - - -  Creatinine 0.44 - 1.00 mg/dL - - - - 0.94   BP/Weight 11/12/2016 10/04/2016 07/05/2016 03/07/2016 5/73/2202  Systolic BP 542 706 237 628 315  Diastolic BP 70 72 70 70 70  Wt. (Lbs) 147 149.3 147.2 146 142.8  BMI 23.02 23.38 23.05 22.87 22.71   Foot/eye exam  completion dates Latest Ref Rng & Units 03/08/2015 04/15/2014  Eye Exam No Retinopathy - No Retinopathy  Foot Form Completion - Done -  Hemoglobin A1c is 8.3.  [name]  reports that she has been smoking Cigarettes.  She has been smoking about 0.20 packs per day. She has never used smokeless tobacco. She reports that she does not drink alcohol or use drugs.     Assessment & Plan:    DM type 2 with diabetic dyslipidemia ([place]) - Plan: HgB A1c  Hypertension associated with diabetes ([place])  Light smoker  Hyperlipidemia associated with type 2 diabetes mellitus ([place])  History of CVA (cerebrovascular accident)  Acute pain of right shoulder  1. Rx changes: none. I will add 2 Aleve twice per day for her right shoulder. She continues have difficulty she will return here for possible injection into the area around the bicipital groove. 2. Education: Reviewed 'ABCs' of diabetes management (respective goals in parentheses):  A1C (<7), blood pressure (<130/80), and cholesterol (LDL <100). 3. Compliance at present is estimated to be fair. Efforts to improve compliance (if necessary) will be directed at increased exercise. 4. Follow up: 4 months She realizes that her A1c is going up and has been under stress dealing with her husband having cancer which has interfered with her daily activities. He seems be doing well which will  make it easier for her to take better care of herself.

## 2016-11-17 ENCOUNTER — Other Ambulatory Visit: Payer: Self-pay | Admitting: Family Medicine

## 2017-01-05 ENCOUNTER — Other Ambulatory Visit: Payer: Self-pay | Admitting: Family Medicine

## 2017-01-05 DIAGNOSIS — Z9889 Other specified postprocedural states: Secondary | ICD-10-CM

## 2017-02-15 ENCOUNTER — Observation Stay (HOSPITAL_COMMUNITY)
Admission: EM | Admit: 2017-02-15 | Discharge: 2017-02-17 | Disposition: A | Discharge status: Home / Self Care | Payer: Medicare Other | Attending: Internal Medicine | Admitting: Internal Medicine

## 2017-02-15 ENCOUNTER — Emergency Department (HOSPITAL_COMMUNITY): Payer: Medicare Other

## 2017-02-15 ENCOUNTER — Inpatient Hospital Stay (HOSPITAL_COMMUNITY): Payer: Medicare Other

## 2017-02-15 ENCOUNTER — Encounter (HOSPITAL_COMMUNITY): Payer: Self-pay | Admitting: *Deleted

## 2017-02-15 DIAGNOSIS — E785 Hyperlipidemia, unspecified: Secondary | ICD-10-CM | POA: Insufficient documentation

## 2017-02-15 DIAGNOSIS — G459 Transient cerebral ischemic attack, unspecified: Secondary | ICD-10-CM | POA: Diagnosis not present

## 2017-02-15 DIAGNOSIS — I10 Essential (primary) hypertension: Secondary | ICD-10-CM | POA: Insufficient documentation

## 2017-02-15 DIAGNOSIS — E1165 Type 2 diabetes mellitus with hyperglycemia: Secondary | ICD-10-CM | POA: Insufficient documentation

## 2017-02-15 DIAGNOSIS — F17210 Nicotine dependence, cigarettes, uncomplicated: Secondary | ICD-10-CM | POA: Insufficient documentation

## 2017-02-15 DIAGNOSIS — E1151 Type 2 diabetes mellitus with diabetic peripheral angiopathy without gangrene: Secondary | ICD-10-CM | POA: Insufficient documentation

## 2017-02-15 DIAGNOSIS — I739 Peripheral vascular disease, unspecified: Secondary | ICD-10-CM

## 2017-02-15 DIAGNOSIS — E1140 Type 2 diabetes mellitus with diabetic neuropathy, unspecified: Secondary | ICD-10-CM | POA: Insufficient documentation

## 2017-02-15 DIAGNOSIS — Z7902 Long term (current) use of antithrombotics/antiplatelets: Secondary | ICD-10-CM | POA: Insufficient documentation

## 2017-02-15 DIAGNOSIS — K219 Gastro-esophageal reflux disease without esophagitis: Secondary | ICD-10-CM | POA: Diagnosis not present

## 2017-02-15 DIAGNOSIS — E1136 Type 2 diabetes mellitus with diabetic cataract: Secondary | ICD-10-CM | POA: Diagnosis not present

## 2017-02-15 DIAGNOSIS — R911 Solitary pulmonary nodule: Secondary | ICD-10-CM | POA: Diagnosis not present

## 2017-02-15 DIAGNOSIS — E1169 Type 2 diabetes mellitus with other specified complication: Secondary | ICD-10-CM | POA: Diagnosis not present

## 2017-02-15 DIAGNOSIS — R51 Headache: Secondary | ICD-10-CM | POA: Diagnosis not present

## 2017-02-15 DIAGNOSIS — R918 Other nonspecific abnormal finding of lung field: Secondary | ICD-10-CM

## 2017-02-15 DIAGNOSIS — I6523 Occlusion and stenosis of bilateral carotid arteries: Secondary | ICD-10-CM | POA: Diagnosis not present

## 2017-02-15 DIAGNOSIS — Z8673 Personal history of transient ischemic attack (TIA), and cerebral infarction without residual deficits: Secondary | ICD-10-CM | POA: Insufficient documentation

## 2017-02-15 LAB — CBC
HCT: 35.6 % — ABNORMAL LOW (ref 36.0–46.0)
Hemoglobin: 12 g/dL (ref 12.0–15.0)
MCH: 30.4 pg (ref 26.0–34.0)
MCHC: 33.7 g/dL (ref 30.0–36.0)
MCV: 90.1 fL (ref 78.0–100.0)
PLATELETS: 477 10*3/uL — AB (ref 150–400)
RBC: 3.95 MIL/uL (ref 3.87–5.11)
RDW: 14.2 % (ref 11.5–15.5)
WBC: 14.3 10*3/uL — AB (ref 4.0–10.5)

## 2017-02-15 LAB — COMPREHENSIVE METABOLIC PANEL
ALT: 11 U/L — ABNORMAL LOW (ref 14–54)
ANION GAP: 10 (ref 5–15)
AST: 14 U/L — ABNORMAL LOW (ref 15–41)
Albumin: 3.6 g/dL (ref 3.5–5.0)
Alkaline Phosphatase: 67 U/L (ref 38–126)
BUN: 19 mg/dL (ref 6–20)
CALCIUM: 9 mg/dL (ref 8.9–10.3)
CHLORIDE: 103 mmol/L (ref 101–111)
CO2: 26 mmol/L (ref 22–32)
CREATININE: 1.24 mg/dL — AB (ref 0.44–1.00)
GFR, EST AFRICAN AMERICAN: 48 mL/min — AB (ref >60)
GFR, EST NON AFRICAN AMERICAN: 41 mL/min — AB (ref >60)
GLUCOSE: 230 mg/dL — AB (ref 65–99)
POTASSIUM: 3.9 mmol/L (ref 3.5–5.1)
SODIUM: 139 mmol/L (ref 135–145)
TOTAL PROTEIN: 6.7 g/dL (ref 6.5–8.1)
Total Bilirubin: 0.4 mg/dL (ref 0.3–1.2)

## 2017-02-15 LAB — I-STAT CHEM 8, ED
BUN: 21 mg/dL — AB (ref 6–20)
CALCIUM ION: 1.12 mmol/L — AB (ref 1.15–1.40)
CHLORIDE: 101 mmol/L (ref 101–111)
CREATININE: 1.2 mg/dL — AB (ref 0.44–1.00)
GLUCOSE: 223 mg/dL — AB (ref 65–99)
HCT: 36 % (ref 36.0–46.0)
Hemoglobin: 12.2 g/dL (ref 12.0–15.0)
Potassium: 3.6 mmol/L (ref 3.5–5.1)
SODIUM: 140 mmol/L (ref 135–145)
TCO2: 25 mmol/L (ref 0–100)

## 2017-02-15 LAB — PROTIME-INR
INR: 0.94
PROTHROMBIN TIME: 12.5 s (ref 11.4–15.2)

## 2017-02-15 LAB — I-STAT TROPONIN, ED: Troponin i, poc: 0 ng/mL (ref 0.00–0.08)

## 2017-02-15 LAB — DIFFERENTIAL
BASOS PCT: 0 %
Basophils Absolute: 0 10*3/uL (ref 0.0–0.1)
EOS ABS: 0.5 10*3/uL (ref 0.0–0.7)
EOS PCT: 3 %
Lymphocytes Relative: 18 %
Lymphs Abs: 2.6 10*3/uL (ref 0.7–4.0)
MONO ABS: 0.8 10*3/uL (ref 0.1–1.0)
Monocytes Relative: 6 %
NEUTROS ABS: 10.4 10*3/uL — AB (ref 1.7–7.7)
Neutrophils Relative %: 73 %

## 2017-02-15 LAB — APTT: aPTT: 27 seconds (ref 24–36)

## 2017-02-15 LAB — CBG MONITORING, ED: Glucose-Capillary: 186 mg/dL — ABNORMAL HIGH (ref 65–99)

## 2017-02-15 MED ORDER — METFORMIN HCL 500 MG PO TABS: 1000.0000 mg | ORAL_TABLET | Freq: Two times a day (BID) | ORAL | Status: DC

## 2017-02-15 MED ORDER — ENOXAPARIN SODIUM 30 MG/0.3ML ~~LOC~~ SOLN
30.0000 mg | SUBCUTANEOUS | Status: DC
  Administered 2017-02-15 – 2017-02-16 (×2): 30 mg via SUBCUTANEOUS
  Filled 2017-02-15 (×2): qty 0.3

## 2017-02-15 MED ORDER — ASPIRIN EC 325 MG PO TBEC
325.0000 mg | DELAYED_RELEASE_TABLET | Freq: Every day | ORAL | Status: DC
  Administered 2017-02-15 – 2017-02-17 (×3): 325 mg via ORAL
  Filled 2017-02-15 (×3): qty 1

## 2017-02-15 MED ORDER — LISINOPRIL-HYDROCHLOROTHIAZIDE 20-12.5 MG PO TABS: 1.0000 | ORAL_TABLET | Freq: Every day | ORAL | Status: DC

## 2017-02-15 MED ORDER — LISINOPRIL 20 MG PO TABS
20.0000 mg | ORAL_TABLET | Freq: Every day | ORAL | Status: DC
  Administered 2017-02-16 – 2017-02-17 (×2): 20 mg via ORAL
  Filled 2017-02-15 (×2): qty 1

## 2017-02-15 MED ORDER — ACETAMINOPHEN 650 MG RE SUPP: 650.0000 mg | RECTAL | Status: DC | PRN

## 2017-02-15 MED ORDER — ACETAMINOPHEN 160 MG/5ML PO SOLN: 650.0000 mg | ORAL | Status: DC | PRN

## 2017-02-15 MED ORDER — STROKE: EARLY STAGES OF RECOVERY BOOK
Freq: Once | Status: AC
  Administered 2017-02-15: 22:00:00

## 2017-02-15 MED ORDER — HYDROCHLOROTHIAZIDE 12.5 MG PO CAPS
12.5000 mg | ORAL_CAPSULE | Freq: Every day | ORAL | Status: DC
  Administered 2017-02-16 – 2017-02-17 (×2): 12.5 mg via ORAL
  Filled 2017-02-15 (×2): qty 1

## 2017-02-15 MED ORDER — SENNOSIDES-DOCUSATE SODIUM 8.6-50 MG PO TABS: 1.0000 | ORAL_TABLET | Freq: Every evening | ORAL | Status: DC | PRN

## 2017-02-15 MED ORDER — SODIUM CHLORIDE 0.9 % IV SOLN
INTRAVENOUS | Status: DC
  Administered 2017-02-15: via INTRAVENOUS

## 2017-02-15 MED ORDER — LINAGLIPTIN 5 MG PO TABS
5.0000 mg | ORAL_TABLET | Freq: Every day | ORAL | Status: DC
  Administered 2017-02-16: 5 mg via ORAL
  Filled 2017-02-15: qty 1

## 2017-02-15 MED ORDER — ROSUVASTATIN CALCIUM 20 MG PO TABS
20.0000 mg | ORAL_TABLET | Freq: Every day | ORAL | Status: DC
  Administered 2017-02-16 – 2017-02-17 (×2): 20 mg via ORAL
  Filled 2017-02-15 (×2): qty 1

## 2017-02-15 MED ORDER — ACETAMINOPHEN 325 MG PO TABS: 650.0000 mg | ORAL_TABLET | ORAL | Status: DC | PRN

## 2017-02-15 MED ORDER — SITAGLIPTIN PHOS-METFORMIN HCL 50-1000 MG PO TABS: 1.0000 | ORAL_TABLET | Freq: Two times a day (BID) | ORAL | Status: DC

## 2017-02-15 MED ORDER — IOPAMIDOL (ISOVUE-370) INJECTION 76%
INTRAVENOUS | Status: AC
Start: 2017-02-15 — End: 2017-02-15
  Administered 2017-02-15: 50 mL
  Filled 2017-02-15: qty 50

## 2017-02-15 MED ORDER — CLOPIDOGREL BISULFATE 75 MG PO TABS
75.0000 mg | ORAL_TABLET | Freq: Every day | ORAL | Status: DC
  Administered 2017-02-16 – 2017-02-17 (×2): 75 mg via ORAL
  Filled 2017-02-15 (×2): qty 1

## 2017-02-15 MED ORDER — VERAPAMIL HCL ER 240 MG PO TBCR
240.0000 mg | EXTENDED_RELEASE_TABLET | Freq: Every day | ORAL | Status: DC
  Administered 2017-02-16: 240 mg via ORAL
  Filled 2017-02-15: qty 1

## 2017-02-15 NOTE — ED Notes (Signed)
Patient transported to CT with transport

## 2017-02-15 NOTE — Consult Note (Signed)
Neurology Consultation Reason for Consult: TIA Referring Physician: Annitta Jersey  CC: Left hand numbness  History is obtained from: Patient  HPI: [name] is a 76 y.o. female with a history of previous stroke, status post carotid endarterectomy on the left he presents with 2 episodes of left hand numbness, blurred vision on the left, difficulty walking that have occurred over the last 2 days. The first event occurred yesterday, and lasted for approximately 10 minutes. Following this, she returned to normal until today around 3:30 PM, she had recurrence of events and this time it lasted 30-40 minutes. She states that it was her left eye, but it is not clear to me if it was truly the left eye or left hemianopia. She states that when she tried to walk, and ran into the door she was trying to walk through with her left side.  She denies slurred speech, headache, weakness, or other symptoms.  She has a previous history of stroke, and sometimes does have some difficult with her speech, particularly when she is tired.  LKW: Yesterday tpa given?: no, resolve symptoms NIHSS: 0    ROS: A 14 point ROS was performed and is negative except as noted in the HPI.   Past Medical History:  Diagnosis Date  . Carotid artery occlusion   . Clostridium difficile infection 06/2013  . Diabetes mellitus    takes Janumet daily  . Dyslipidemia    takes Crestor daily  . Gallstones   . GERD (gastroesophageal reflux disease)   . Heart murmur    hx of  . Hypertension    takes Prinzide and Verapamil daily  . Impaired speech    from stroke  . Neuropathy, diabetic ([place])   . Pneumonia   . PONV (postoperative nausea and vomiting)   . Smoker   . Stroke ([place]) 06/25/13  . Vertigo    but doesn't take any meds     Family History  Problem Relation Age of Onset  . Leukemia Mother   . Diabetes Mother   . Cancer Father        esophageal ca  . Heart disease Father   . Diabetes Maternal Aunt   .  Diabetes Maternal Uncle   . Diabetes Brother   . Diabetes Sister   . Heart attack Brother        x 2     Social History:  reports that she has been smoking Cigarettes.  She has been smoking about 0.20 packs per day. She has never used smokeless tobacco. She reports that she does not drink alcohol or use drugs.   Exam: Current vital signs: BP 116/62   Pulse 77   Temp 98.6 F (37 C) (Oral)   Resp 16   SpO2 97%  Vital signs in last 24 hours: Temp:  [98.6 F (37 C)] 98.6 F (37 C) (08/03 1649) Pulse Rate:  [74-86] 77 (08/03 1945) Resp:  [16-18] 16 (08/03 1815) BP: (106-139)/(55-71) 116/62 (08/03 1945) SpO2:  [97 %-98 %] 97 % (08/03 1945)   Physical Exam  Constitutional: Appears well-developed and well-nourished.  Psych: Affect appropriate to situation Eyes: No scleral injection HENT: No OP obstrucion Head: Normocephalic.  Cardiovascular: Normal rate and regular rhythm.  Respiratory: Effort normal and breath sounds normal to anterior ascultation GI: Soft.  No distension. There is no tenderness.  Skin: WDI  Neuro: Mental Status: Patient is awake, alert, oriented to person, place, month, year, and situation. Patient is able to give a clear  and coherent history. No signs of aphasia or neglect Cranial Nerves: II: Visual Fields are full. Pupils are equal, round, and reactive to light.   III,IV, VI: EOMI without ptosis or diploplia.  V: Facial sensation is symmetric to temperature VII: Facial movement is symmetric.  VIII: hearing is intact to voice X: Uvula elevates symmetrically XI: Shoulder shrug is symmetric. XII: tongue is midline without atrophy or fasciculations.  Motor: Tone is normal. Bulk is normal. 5/5 strength was present in all four extremities.  Sensory: Sensation is symmetric to light touch and temperature in the arms and legs. Cerebellar: FNF and HKS are intact bilaterally   I have reviewed labs in epic and the results pertinent to this consultation  are: CMP-borderline creatinine  I have reviewed the images obtained: CT head-unremarkable  Impression: 76 year old female with 2 episodes of left hand numbness, blurred vision on the left, difficulty walking over the past 2 days. This is concerning for recurrent TIA. I would favor starting dual antiplatelet therapy in the setting, she is already on Plavix and therefore I will not load but I will give full dose aspirin. She will need to be admitted for secondary risk factor modification.  Recommendations: 1. HgbA1c, fasting lipid panel 2. MRI of the brain without contrast 3. Frequent neuro checks 4. Echocardiogram 5. CT angiogram of the head and neck 6. Prophylactic therapy-Antiplatelet med: Aspirin - dose 325mg  PO or 300mg  PR and Plavix 75 mg daily 7. Risk factor modification 8. Telemetry monitoring 9. PT consult, OT consult, Speech consult 10. please page stroke NP  Or  PA  Or MD  from 8am -4 pm as this patient will be followed by the stroke team at this point.   You can look them up on [email or web address]      Roland Rack, MD [facility] [phone]  If 7pm- 7am, please page neurology on call as listed in [place].

## 2017-02-15 NOTE — ED Notes (Signed)
Kirkpatrick MD at bedside

## 2017-02-15 NOTE — ED Notes (Signed)
Attempted report x 1; name and call back number provided 

## 2017-02-15 NOTE — ED Notes (Signed)
ED Provider at bedside. 

## 2017-02-15 NOTE — H&P (Signed)
[facility]                                                                                    Patient Demographics  [name], is a 76 y.o. female  CSN: [id number]  MRN: [id number]  DOB - 18-Nov-1940  Admit Date - 02/15/2017  Outpatient Primary MD for the patient is Nichole Lung, MD   With History of -  Past Medical History:  Diagnosis Date  . Carotid artery occlusion   . Clostridium difficile infection 06/2013  . Diabetes mellitus    takes Janumet daily  . Dyslipidemia    takes Crestor daily  . Gallstones   . GERD (gastroesophageal reflux disease)   . Heart murmur    hx of  . Hypertension    takes Prinzide and Verapamil daily  . Impaired speech    from stroke  . Neuropathy, diabetic ([place])   . Pneumonia   . PONV (postoperative nausea and vomiting)   . Smoker   . Stroke ([place]) 06/25/13  . Vertigo    but doesn't take any meds      Past Surgical History:  Procedure Laterality Date  . cataract removed Right   . CHOLECYSTECTOMY    . ENDARTERECTOMY Left 07/14/2013   Procedure: ENDARTERECTOMY CAROTID-LEFT;  Surgeon: Nichole Dutch, MD;  Location: [facility];  Service: Vascular;  Laterality: Left;  . KNEE SURGERY Left 86yr ago  . LAPAROSCOPIC PARTIAL COLECTOMY N/A 09/02/2015   Procedure: LAPAROSCOPIC PARTIAL RIGHT COLECTOMY;  Surgeon: Nichole Ruff MD;  Location: [facility];  Service: General;  Laterality: N/A;  . PATCH ANGIOPLASTY Left 07/14/2013   Procedure: LEFT CAROTID ARTERY PATCH ANGIOPLASTY;  Surgeon: Nichole Dutch MD;  [place] [facility]  Service: Vascular;  Laterality: Left;    in for   Chief Complaint  Patient presents with  . Stroke Symptoms     HPI  [name] is a 76y.o. female, With past medical history significant for CVA , peripheral vascular disease status post left endarterectomy in the past, being followed by ultrasound of left carotid artery yearly, last one on March 2018, presenting with an episode of questionable left  sided blurring of vision versus hemianopsia followed by dizziness and unsteadiness. Today she hit the side of the door while leaving a store. Patient denies any loss of consciousness or irregular heartbeats. She still smokes . CT of the head did not show any acute events. CT angiogram of the head and neck is still pending    Review of Systems    In addition to the HPI above,  No Fever-chills, No Headache, No problems swallowing food or Liquids, No Chest pain, Cough or Shortness of Breath, No Abdominal pain, No Nausea or Vommitting, Bowel movements are regular, No Blood in stool or Urine, No dysuria, No new skin rashes or bruises, No new joints pains-aches,  No new weakness, tingling, numbness in any extremity, No recent weight gain or loss, No polyuria, polydypsia or polyphagia, No significant Mental Stressors.  A full 10 point Review of Systems was done, except as stated above, all other Review of Systems were negative.   Social History Social  History  Substance Use Topics  . Smoking status: Current Some Day Smoker    Packs/day: 0.20    Types: Cigarettes  . Smokeless tobacco: Never Used  . Alcohol use No     Family History Family History  Problem Relation Age of Onset  . Leukemia Mother   . Diabetes Mother   . Cancer Father        esophageal ca  . Heart disease Father   . Diabetes Maternal Aunt   . Diabetes Maternal Uncle   . Diabetes Brother   . Diabetes Sister   . Heart attack Brother        x 2     Prior to Admission medications   Medication Sig Start Date End Date Taking? Authorizing Provider  ACCU-CHEK AVIVA PLUS test strip CHECK BLOOD SUGAR TWO TIMES DAILY 07/10/16   Nichole Lung, MD  ACCU-CHEK SOFTCLIX LANCETS lancets Patient is to test two times a day DX E11.9 11/19/16   Nichole Lung, MD  Blood Glucose Monitoring Suppl (ACCU-CHEK AVIVA PLUS) w/Device KIT 1 kit by Does not apply route 2 (two) times daily. 10/31/15   Nichole Lung, MD  clopidogrel  (PLAVIX) 75 MG tablet take 1 tablet by mouth once daily 03/20/16   Nichole Lung, MD  clopidogrel (PLAVIX) 75 MG tablet TAKE 1 TABLET BY MOUTH  DAILY 01/07/17   Nichole Lung, MD  JANUMET 50-1000 MG tablet take 1 tablet twice a day with food 04/17/16   Nichole Lung, MD  lisinopril-hydrochlorothiazide (PRINZIDE,ZESTORETIC) 20-12.5 MG tablet TAKE 1 TABLET BY MOUTH  DAILY 10/02/16   Nichole Lung, MD  rosuvastatin (CRESTOR) 20 MG tablet take 1 tablet by mouth once daily 08/30/16   Nichole Lung, MD  verapamil (CALAN-SR) 240 MG CR tablet TAKE 1 TABLET BY MOUTH  DAILY 01/07/17   Nichole Lung, MD    Allergies  Allergen Reactions  . Codeine Nausea And Vomiting  . Lipitor [Atorvastatin] Nausea And Vomiting  . Phenergan [Promethazine Hcl] Other (See Comments)    Confusion, hallucinations, severe aggitation    Physical Exam  Vitals  Blood pressure (!) 108/53, pulse 76, temperature 98.6 F (37 C), temperature source Oral, resp. rate 16, SpO2 97 %.   1. General Well-developed, well-nourished female, extremely pleasant  2. Normal affect and insight, Not Suicidal or Homicidal, Awake Alert, Oriented X 3.  3. No F.N deficits, grossly, patient moving all extremities.  4. Ears and Eyes appear Normal, Conjunctivae clear, PERRLA. Moist Oral Mucosa.  5. Supple Neck, No JVD, No cervical lymphadenopathy appriciated, No Carotid Bruits.  6. Symmetrical Chest wall movement, Good air movement bilaterally, CTAB.  7. RRR, No Gallops, Rubs or Murmurs, No Parasternal Heave.  8. Positive Bowel Sounds, Abdomen Soft, Non tender, No organomegaly appriciated,No rebound -guarding or rigidity.  9.  No Cyanosis, Normal Skin Turgor, No Skin Rash or Bruise.  10. Good muscle tone,  joints appear normal , no effusions, Normal ROM.    Data Review  CBC  Recent Labs Lab 02/15/17 1712 02/15/17 1715  WBC 14.3*  --   HGB 12.0 12.2  HCT 35.6* 36.0  PLT 477*  --   MCV 90.1  --   MCH 30.4  --   MCHC  33.7  --   RDW 14.2  --   LYMPHSABS 2.6  --   MONOABS 0.8  --   EOSABS 0.5  --   BASOSABS 0.0  --    ------------------------------------------------------------------------------------------------------------------  Chemistries  Recent Labs Lab 02/15/17 1712 02/15/17 1715  NA 139 140  K 3.9 3.6  CL 103 101  CO2 26  --   GLUCOSE 230* 223*  BUN 19 21*  CREATININE 1.24* 1.20*  CALCIUM 9.0  --   AST 14*  --   ALT 11*  --   ALKPHOS 67  --   BILITOT 0.4  --    ------------------------------------------------------------------------------------------------------------------ CrCl cannot be calculated (Unknown ideal weight.). ------------------------------------------------------------------------------------------------------------------ No results for input(s): TSH, T4TOTAL, T3FREE, THYROIDAB in the last 72 hours.  Invalid input(s): FREET3   Coagulation profile  Recent Labs Lab 02/15/17 1712  INR 0.94   ------------------------------------------------------------------------------------------------------------------- No results for input(s): DDIMER in the last 72 hours. -------------------------------------------------------------------------------------------------------------------  Cardiac Enzymes No results for input(s): CKMB, TROPONINI, MYOGLOBIN in the last 168 hours.  Invalid input(s): CK ------------------------------------------------------------------------------------------------------------------ Invalid input(s): POCBNP   ---------------------------------------------------------------------------------------------------------------  Urinalysis    Component Value Date/Time   COLORURINE YELLOW 07/25/2013 [place] 07/25/2013 0952   LABSPEC 1.025 07/25/2013 0952   PHURINE 6.5 07/25/2013 0952   GLUCOSEU >1000 (A) 07/25/2013 0952   HGBUR NEGATIVE 07/25/2013 0952   BILIRUBINUR NEGATIVE 07/25/2013 0952   KETONESUR 15 (A) 07/25/2013  0952   PROTEINUR 30 (A) 07/25/2013 0952   UROBILINOGEN 0.2 07/25/2013 0952   NITRITE NEGATIVE 07/25/2013 0952   LEUKOCYTESUR NEGATIVE 07/25/2013 0952    ----------------------------------------------------------------------------------------------------------------     Imaging results:   Ct Head Wo Contrast  Result Date: 02/15/2017 CLINICAL DATA:  76 y/o F; left-sided headache and left-sided blurred vision. EXAM: CT HEAD WITHOUT CONTRAST TECHNIQUE: Contiguous axial images were obtained from the base of the skull through the vertex without intravenous contrast. COMPARISON:  09/08/2013 CT head FINDINGS: Brain: No evidence of acute infarction, hemorrhage, hydrocephalus, extra-axial collection or mass lesion/mass effect. Stable small chronic infarction in left superolateral frontal lobe. Stable small lucency in right posterior putamen probably representing a lacunar infarction. Mild chronic microvascular ischemic changes and parenchymal volume loss of the brain. Vascular: No hyperdense vessel identified. Moderate calcific plaque of the carotid siphons. Skull: Normal. Negative for fracture or focal lesion. Sinuses/Orbits: No acute finding. Other: None. IMPRESSION: 1. No acute intracranial abnormality identified. 2. Stable small chronic left superolateral frontal cortical infarction and right putamen chronic lacunar infarction. 3. Mild chronic microvascular ischemic changes and mild parenchymal volume loss of the brain. Electronically Signed   By: Kristine Garbe M.D.   On: 02/15/2017 17:59    My personal review of EKG: Normal sinus rhythm at 85 bpm with left anterior fascicular block    Assessment & Plan   1. TIA, repeated 2. Peripheral vascular disease status post left carotid endarterectomy in 2014 3. History of CVA  Plan  Admit to neuro telemetry Plavix/aspirin Neurochecks CT angiogram of the neck as per neurology consult   DVT Prophylaxis Lovenox  AM Labs Ordered, also  please review Full Orders  Family Communication: Admission, patients condition and plan of care including tests being ordered have been discussed with the patient and daughter who indicate understanding and agree with the plan and Code Status.  Code Status full  Disposition Plan: To be determined  Time spent in minutes : 37 minutes  Condition GUARDED   '@SIGNATURE' @

## 2017-02-15 NOTE — ED Notes (Signed)
Pt to floor with tech on tele

## 2017-02-15 NOTE — ED Triage Notes (Signed)
Pt and family reports that pt had onset at approx 1400 of left side headache and left side blurred vision. Had numbness to left hand and nausea, all symptoms resolved within approx 20 mins. Pt had similar episode yesterday.

## 2017-02-15 NOTE — ED Provider Notes (Signed)
[facility] DEPT Provider Note   CSN: [id number] Arrival date & time: 02/15/17  1640     History   Chief Complaint Chief Complaint  Patient presents with  . Stroke Symptoms    HPI [name] is a 76 y.o. female.  HPI she had visual field cut this afternoon where she could not see out of her left eye approximately 3 PM symptoms coated by numbness in her left hand. Symptoms resolved spontaneously without treatment. Episode lasted 45 minutes She had a similar episode yesterday lasting 15 minutes she is presently asymptomatic. Patient had bilateral mild frontal headache earlier today, not at the time of visual changes. She gets similar headaches periodically. She is presently asymptomatic without treatment. Nothing made symptoms better or worse. No other associated symptoms no difficulties with speech or gait though the visual field cut in her left eye caused her to bump into a door with her left shoulder  Past Medical History:  Diagnosis Date  . Carotid artery occlusion   . Clostridium difficile infection 06/2013  . Diabetes mellitus    takes Janumet daily  . Dyslipidemia    takes Crestor daily  . Gallstones   . GERD (gastroesophageal reflux disease)   . Heart murmur    hx of  . Hypertension    takes Prinzide and Verapamil daily  . Impaired speech    from stroke  . Neuropathy, diabetic ([place])   . Pneumonia   . PONV (postoperative nausea and vomiting)   . Smoker   . Stroke ([place]) 06/25/13  . Vertigo    but doesn't take any meds    Patient Active Problem List   Diagnosis Date Noted  . Hyperlipidemia associated with type 2 diabetes mellitus ([place]) 03/07/2016  . Adenomatous colon polyp s/p laparoscopic right colectomy 09/02/15 09/02/2015  . Hemispheric carotid artery syndrome 04/07/2015  . Cataract associated with type 2 diabetes mellitus ([place]) 03/08/2015  . S/P recent Left carotid endarterectomy 07/14/2013  . History of CVA (cerebrovascular accident) 06/27/2013  .  Hypertension associated with diabetes ([place]) 01/04/2011  . DM type 2 with diabetic dyslipidemia ([place]) 01/04/2011  . Light smoker 01/04/2011    Past Surgical History:  Procedure Laterality Date  . cataract removed Right   . CHOLECYSTECTOMY    . ENDARTERECTOMY Left 07/14/2013   Procedure: ENDARTERECTOMY CAROTID-LEFT;  Surgeon: Elam Dutch, MD;  Location: [facility];  Service: Vascular;  Laterality: Left;  . KNEE SURGERY Left 73yr ago  . LAPAROSCOPIC PARTIAL COLECTOMY N/A 09/02/2015   Procedure: LAPAROSCOPIC PARTIAL RIGHT COLECTOMY;  Surgeon: ALeighton Ruff MD;  Location: [facility];  Service: General;  Laterality: N/A;  . PATCH ANGIOPLASTY Left 07/14/2013   Procedure: LEFT CAROTID ARTERY PATCH ANGIOPLASTY;  Surgeon: CElam Dutch MD;  [facility]  Service: Vascular;  Laterality: Left;    OB History    No data available       Home Medications    Prior to Admission medications   Medication Sig Start Date End Date Taking? Authorizing Provider  ACCU-CHEK AVIVA PLUS test strip CHECK BLOOD SUGAR TWO TIMES DAILY 07/10/16   LDenita Lung MD  ACCU-CHEK SOFTCLIX LANCETS lancets Patient is to test two times a day DX E11.9 11/19/16   LDenita Lung MD  Blood Glucose Monitoring Suppl (ACCU-CHEK AVIVA PLUS) w/Device KIT 1 kit by Does not apply route 2 (two) times daily. 10/31/15   LDenita Lung MD  clopidogrel (PLAVIX) 75 MG tablet take 1 tablet by mouth once daily  03/20/16   [name], MD  clopidogrel (PLAVIX) 75 MG tablet TAKE 1 TABLET BY MOUTH  DAILY 01/07/17   [name], MD  JANUMET 50-1000 MG tablet take 1 tablet twice a day with food 04/17/16   [name], MD  lisinopril-hydrochlorothiazide (PRINZIDE,ZESTORETIC) 20-12.5 MG tablet TAKE 1 TABLET BY MOUTH  DAILY 10/02/16   [name], MD  rosuvastatin (CRESTOR) 20 MG tablet take 1 tablet by mouth once daily 08/30/16   [name], MD  verapamil (CALAN-SR) 240 MG CR tablet TAKE 1 TABLET BY MOUTH  DAILY 01/07/17    [name], MD    Family History Family History  Problem Relation Age of Onset  . Leukemia Mother   . Diabetes Mother   . Cancer Father        esophageal ca  . Heart disease Father   . Diabetes Maternal Aunt   . Diabetes Maternal Uncle   . Diabetes Brother   . Diabetes Sister   . Heart attack Brother        x 2    Social History Social History  Substance Use Topics  . Smoking status: Current Some Day Smoker    Packs/day: 0.20    Types: Cigarettes  . Smokeless tobacco: Never Used  . Alcohol use No     Allergies   Codeine; Lipitor [atorvastatin]; and Phenergan [promethazine hcl]   Review of Systems Review of Systems  Constitutional: Negative.   HENT: Negative.   Eyes: Positive for visual disturbance.  Respiratory: Negative.   Cardiovascular: Negative.   Gastrointestinal: Negative.   Musculoskeletal: Negative.   Skin: Negative.   Allergic/Immunologic: Positive for immunocompromised state.       Diabetic  Neurological: Positive for numbness.  Psychiatric/Behavioral: Negative.   All other systems reviewed and are negative.    Physical Exam Updated Vital Signs BP 115/62   Pulse 84   Temp 98.6 F (37 C) (Oral)   Resp 18   SpO2 98%   Physical Exam  Constitutional: She is oriented to person, place, and time. She appears well-developed and well-nourished.  HENT:  Head: Normocephalic and atraumatic.  Eyes: Pupils are equal, round, and reactive to light. Conjunctivae are normal.  Neck: Neck supple. No tracheal deviation present. No thyromegaly present.  Left carotid endarterectomy scar. No bruit  Cardiovascular: Normal rate and regular rhythm.   No murmur heard. Pulmonary/Chest: Effort normal and breath sounds normal.  Abdominal: Soft. Bowel sounds are normal. She exhibits no distension. There is no tenderness.  Musculoskeletal: Normal range of motion. She exhibits no edema or tenderness.  Neurological: She is alert and oriented to person, place, and  time. Coordination normal.  Gait normal Romberg normal pronator drift normal finger to nose normal DTR symmetric bilaterally at knee jerk ankle jerk and biceps was ordered bilaterally  Skin: Skin is warm and dry. No rash noted.  Psychiatric: She has a normal mood and affect.  Nursing note and vitals reviewed.    ED Treatments / Results  Labs (all labs ordered are listed, but only abnormal results are displayed) Labs Reviewed  CBC - Abnormal; Notable for the following:       Result Value   WBC 14.3 (*)    HCT 35.6 (*)    Platelets 477 (*)    All other components within normal limits  DIFFERENTIAL - Abnormal; Notable for the following:    Neutro Abs 10.4 (*)    All other components within normal limits  COMPREHENSIVE METABOLIC PANEL - Abnormal; Notable for the following:    Glucose, Bld 230 (*)    Creatinine, Ser 1.24 (*)    AST 14 (*)    ALT 11 (*)    GFR calc non Af Amer 41 (*)    GFR calc Af Amer 48 (*)    All other components within normal limits  CBG MONITORING, ED - Abnormal; Notable for the following:    Glucose-Capillary 186 (*)    All other components within normal limits  I-STAT CHEM 8, ED - Abnormal; Notable for the following:    BUN 21 (*)    Creatinine, Ser 1.20 (*)    Glucose, Bld 223 (*)    Calcium, Ion 1.12 (*)    All other components within normal limits  PROTIME-INR  APTT  I-STAT TROPONIN, ED    EKG  EKG Interpretation  Date/Time:  Friday February 15 2017 16:52:18 EDT Ventricular Rate:  85 PR Interval:  178 QRS Duration: 76 QT Interval:  378 QTC Calculation: 449 R Axis:   -49 Text Interpretation:  Normal sinus rhythm Left anterior fascicular block Inferior infarct , age undetermined Anterior infarct , age undetermined Abnormal ECG No significant change since last tracing Confirmed by Orlie Dakin [phone]) on 02/15/2017 6:35:57 PM      Results for orders placed or performed during the hospital encounter of 02/15/17  Protime-INR  Result Value Ref  Range   Prothrombin Time 12.5 11.4 - 15.2 seconds   INR 0.94   APTT  Result Value Ref Range   aPTT 27 24 - 36 seconds  CBC  Result Value Ref Range   WBC 14.3 (H) 4.0 - 10.5 K/uL   RBC 3.95 3.87 - 5.11 MIL/uL   Hemoglobin 12.0 12.0 - 15.0 g/dL   HCT 35.6 (L) 36.0 - 46.0 %   MCV 90.1 78.0 - 100.0 fL   MCH 30.4 26.0 - 34.0 pg   MCHC 33.7 30.0 - 36.0 g/dL   RDW 14.2 11.5 - 15.5 %   Platelets 477 (H) 150 - 400 K/uL  Differential  Result Value Ref Range   Neutrophils Relative % 73 %   Neutro Abs 10.4 (H) 1.7 - 7.7 K/uL   Lymphocytes Relative 18 %   Lymphs Abs 2.6 0.7 - 4.0 K/uL   Monocytes Relative 6 %   Monocytes Absolute 0.8 0.1 - 1.0 K/uL   Eosinophils Relative 3 %   Eosinophils Absolute 0.5 0.0 - 0.7 K/uL   Basophils Relative 0 %   Basophils Absolute 0.0 0.0 - 0.1 K/uL  Comprehensive metabolic panel  Result Value Ref Range   Sodium 139 135 - 145 mmol/L   Potassium 3.9 3.5 - 5.1 mmol/L   Chloride 103 101 - 111 mmol/L   CO2 26 22 - 32 mmol/L   Glucose, Bld 230 (H) 65 - 99 mg/dL   BUN 19 6 - 20 mg/dL   Creatinine, Ser 1.24 (H) 0.44 - 1.00 mg/dL   Calcium 9.0 8.9 - 10.3 mg/dL   Total Protein 6.7 6.5 - 8.1 g/dL   Albumin 3.6 3.5 - 5.0 g/dL   AST 14 (L) 15 - 41 U/L   ALT 11 (L) 14 - 54 U/L   Alkaline Phosphatase 67 38 - 126 U/L   Total Bilirubin 0.4 0.3 - 1.2 mg/dL   GFR calc non Af Amer 41 (L) >60 mL/min   GFR calc Af Amer 48 (L) >60 mL/min   Anion gap 10 5 - 15  I-stat  troponin, ED  Result Value Ref Range   Troponin i, poc 0.00 0.00 - 0.08 ng/mL   Comment 3          CBG monitoring, ED  Result Value Ref Range   Glucose-Capillary 186 (H) 65 - 99 mg/dL  I-Stat Chem 8, ED  Result Value Ref Range   Sodium 140 135 - 145 mmol/L   Potassium 3.6 3.5 - 5.1 mmol/L   Chloride 101 101 - 111 mmol/L   BUN 21 (H) 6 - 20 mg/dL   Creatinine, Ser 1.20 (H) 0.44 - 1.00 mg/dL   Glucose, Bld 223 (H) 65 - 99 mg/dL   Calcium, Ion 1.12 (L) 1.15 - 1.40 mmol/L   TCO2 25 0 - 100 mmol/L     Hemoglobin 12.2 12.0 - 15.0 g/dL   HCT 36.0 36.0 - 46.0 %   Ct Head Wo Contrast  Result Date: 02/15/2017 CLINICAL DATA:  76 y/o F; left-sided headache and left-sided blurred vision. EXAM: CT HEAD WITHOUT CONTRAST TECHNIQUE: Contiguous axial images were obtained from the base of the skull through the vertex without intravenous contrast. COMPARISON:  09/08/2013 CT head FINDINGS: Brain: No evidence of acute infarction, hemorrhage, hydrocephalus, extra-axial collection or mass lesion/mass effect. Stable small chronic infarction in left superolateral frontal lobe. Stable small lucency in right posterior putamen probably representing a lacunar infarction. Mild chronic microvascular ischemic changes and parenchymal volume loss of the brain. Vascular: No hyperdense vessel identified. Moderate calcific plaque of the carotid siphons. Skull: Normal. Negative for fracture or focal lesion. Sinuses/Orbits: No acute finding. Other: None. IMPRESSION: 1. No acute intracranial abnormality identified. 2. Stable small chronic left superolateral frontal cortical infarction and right putamen chronic lacunar infarction. 3. Mild chronic microvascular ischemic changes and mild parenchymal volume loss of the brain. Electronically Signed   By: Kristine Garbe M.D.   On: 02/15/2017 17:59   Radiology Ct Head Wo Contrast  Result Date: 02/15/2017 CLINICAL DATA:  76 y/o F; left-sided headache and left-sided blurred vision. EXAM: CT HEAD WITHOUT CONTRAST TECHNIQUE: Contiguous axial images were obtained from the base of the skull through the vertex without intravenous contrast. COMPARISON:  09/08/2013 CT head FINDINGS: Brain: No evidence of acute infarction, hemorrhage, hydrocephalus, extra-axial collection or mass lesion/mass effect. Stable small chronic infarction in left superolateral frontal lobe. Stable small lucency in right posterior putamen probably representing a lacunar infarction. Mild chronic microvascular ischemic  changes and parenchymal volume loss of the brain. Vascular: No hyperdense vessel identified. Moderate calcific plaque of the carotid siphons. Skull: Normal. Negative for fracture or focal lesion. Sinuses/Orbits: No acute finding. Other: None. IMPRESSION: 1. No acute intracranial abnormality identified. 2. Stable small chronic left superolateral frontal cortical infarction and right putamen chronic lacunar infarction. 3. Mild chronic microvascular ischemic changes and mild parenchymal volume loss of the brain. Electronically Signed   By: Kristine Garbe M.D.   On: 02/15/2017 17:59    Procedures Procedures (including critical care time)  Medications Ordered in ED Medications - No data to display   Initial Impression / Assessment and Plan / ED Course  I have reviewed the triage vital signs and the nursing notes.  Pertinent labs & imaging results that were available during my care of the patient were reviewed by me and considered in my medical decision making (see chart for details).     Symptoms consistent with TIA. I've consulted hospitalist service who will arrange for overnight stay. I've also consulted Dr. Leonel Ramsay from neurology service he will see patient in  hospital. I counseled patient for 5 minutes on smoking cessation  Final Clinical Impressions(s) / ED Diagnoses  Diagnosis #1 TIA #2 hyperglycemia #3 tobacco abuse Final diagnoses:  None    New Prescriptions New Prescriptions   No medications on file     Orlie Dakin, MD 02/15/17 1925

## 2017-02-16 ENCOUNTER — Inpatient Hospital Stay (HOSPITAL_COMMUNITY): Payer: Medicare Other

## 2017-02-16 ENCOUNTER — Observation Stay (HOSPITAL_COMMUNITY): Payer: Medicare Other

## 2017-02-16 ENCOUNTER — Encounter (HOSPITAL_COMMUNITY): Payer: Self-pay

## 2017-02-16 DIAGNOSIS — R911 Solitary pulmonary nodule: Secondary | ICD-10-CM | POA: Diagnosis not present

## 2017-02-16 DIAGNOSIS — E785 Hyperlipidemia, unspecified: Secondary | ICD-10-CM | POA: Diagnosis not present

## 2017-02-16 DIAGNOSIS — E1136 Type 2 diabetes mellitus with diabetic cataract: Secondary | ICD-10-CM | POA: Diagnosis not present

## 2017-02-16 DIAGNOSIS — R918 Other nonspecific abnormal finding of lung field: Secondary | ICD-10-CM | POA: Diagnosis not present

## 2017-02-16 DIAGNOSIS — E1169 Type 2 diabetes mellitus with other specified complication: Secondary | ICD-10-CM | POA: Diagnosis not present

## 2017-02-16 DIAGNOSIS — Z8673 Personal history of transient ischemic attack (TIA), and cerebral infarction without residual deficits: Secondary | ICD-10-CM | POA: Diagnosis not present

## 2017-02-16 DIAGNOSIS — G459 Transient cerebral ischemic attack, unspecified: Secondary | ICD-10-CM | POA: Diagnosis not present

## 2017-02-16 DIAGNOSIS — R51 Headache: Secondary | ICD-10-CM | POA: Diagnosis not present

## 2017-02-16 DIAGNOSIS — K5730 Diverticulosis of large intestine without perforation or abscess without bleeding: Secondary | ICD-10-CM | POA: Diagnosis not present

## 2017-02-16 LAB — GLUCOSE, CAPILLARY
GLUCOSE-CAPILLARY: 116 mg/dL — AB (ref 65–99)
GLUCOSE-CAPILLARY: 177 mg/dL — AB (ref 65–99)
GLUCOSE-CAPILLARY: 206 mg/dL — AB (ref 65–99)

## 2017-02-16 LAB — LIPID PANEL
CHOLESTEROL: 154 mg/dL (ref 0–200)
HDL: 38 mg/dL — ABNORMAL LOW (ref >40)
LDL CALC: 68 mg/dL (ref 0–99)
TRIGLYCERIDES: 241 mg/dL — AB (ref <150)
Total CHOL/HDL Ratio: 4.1 RATIO
VLDL: 48 mg/dL — AB (ref 0–40)

## 2017-02-16 MED ORDER — IOPAMIDOL (ISOVUE-300) INJECTION 61%
INTRAVENOUS | Status: AC
  Filled 2017-02-16: qty 75

## 2017-02-16 MED ORDER — ONDANSETRON HCL 4 MG/2ML IJ SOLN
4.0000 mg | Freq: Four times a day (QID) | INTRAMUSCULAR | Status: DC | PRN
  Administered 2017-02-16: 4 mg via INTRAVENOUS
  Filled 2017-02-16: qty 2

## 2017-02-16 MED ORDER — INSULIN ASPART 100 UNIT/ML ~~LOC~~ SOLN: 0.0000 [IU] | Freq: Every day | SUBCUTANEOUS | Status: DC

## 2017-02-16 MED ORDER — INSULIN ASPART 100 UNIT/ML ~~LOC~~ SOLN
0.0000 [IU] | Freq: Three times a day (TID) | SUBCUTANEOUS | Status: DC
  Administered 2017-02-16 – 2017-02-17 (×2): 5 [IU] via SUBCUTANEOUS

## 2017-02-16 MED ORDER — IOPAMIDOL (ISOVUE-300) INJECTION 61%
INTRAVENOUS | Status: AC
  Administered 2017-02-16: 75 mL
  Filled 2017-02-16: qty 30

## 2017-02-16 MED ORDER — ALUM & MAG HYDROXIDE-SIMETH 200-200-20 MG/5ML PO SUSP
15.0000 mL | ORAL | Status: DC | PRN
  Administered 2017-02-16: 15 mL via ORAL
  Filled 2017-02-16: qty 30

## 2017-02-16 NOTE — Progress Notes (Signed)
SLP Cancellation Note  Patient Details Name: [name] MRN: [id number] DOB: [birth year]   Cancelled treatment:       Reason Eval/Treat Not Completed: SLP screened, no needs identified, will sign off. Pt admitted with strokelike symptoms which per pt have resolved; reports baseline level of function.   [name], [place], [place] Speech-Language Pathologist [phone]   Aliene Altes 02/16/2017, 2:53 PM

## 2017-02-16 NOTE — Care Management Obs Status (Signed)
[facility] NOTIFICATION   Patient Details  Name: ADAJAH COCKING MRN: [id number] Date of Birth: [birth year]   Medicare Observation Status Notification Given:  Yes    Carles Collet, RN 02/16/2017, 4:19 PM

## 2017-02-16 NOTE — Care Management CC44 (Signed)
Condition Code 44 Documentation Completed  Patient Details  Name: [name] MRN: [id number] Date of Birth: Dec 05, 1940   Condition Code 44 given:  Yes Patient signature on Condition Code 44 notice:  Yes Documentation of 2 MD's agreement:  Yes Code 44 added to claim:  Yes    Carles Collet, RN 02/16/2017, 4:19 PM

## 2017-02-16 NOTE — Progress Notes (Signed)
OT Cancellation Note  Patient Details Name: RODNISHA BLOMGREN MRN: [id number] DOB: 1940-10-08   Cancelled Treatment:    Reason Eval/Treat Not Completed: OT screened, no needs identified, will sign off  Redmond Baseman, MS, OTR/L 02/16/2017, 10:08 AM

## 2017-02-16 NOTE — Progress Notes (Signed)
PROGRESS NOTE    [name]  YOV:[id number] DOB: 1941/05/21 DOA: 02/15/2017 PCP: [name], MD    Brief Narrative:  76 y.o. female, With past medical history significant for CVA , peripheral vascular disease status post left endarterectomy in the past, being followed by ultrasound of left carotid artery yearly, last one on March 2018, presenting with an episode of questionable left sided blurring of vision versus hemianopsia followed by dizziness and unsteadiness. Today she hit the side of the door while leaving a store. Patient denies any loss of consciousness or irregular heartbeats. She still smokes . CT of the head did not show any acute events  Assessment & Plan:   Principal Problem:   TIA (transient ischemic attack) Active Problems:   DM type 2 with diabetic dyslipidemia ([place])   Cataract associated with type 2 diabetes mellitus ([place])   Hyperlipidemia associated with type 2 diabetes mellitus ([place])   Lung mass   1. Possible TIA 1. MRI reviewed. No evidence of acute infarct 2. Neurology following 3. 2d echo is pending 4. Neurology recommendations for aspirin 325 mg daily and clopidogrel 75 mg daily 5. Recommendation for crestor 20mg  daily 2. HTN 1. BP currently stable and controlled 2. Cont current regimen 3. DM2 1. a1c pending 2. Glucose in the 200's 3. Cont on SSI coverage 4. Lung nodule 1. 3.6x2.8cm RUL cavitary mass noted incidentally on CT angio neck 2. Have ordered formal CT chest and abd/pelvis to follow up 5. HLD 1. Stable at present 2. Cont on crestor 20mg  daily per neurology  DVT prophylaxis: Lovenox subQ Code Status: Full Family Communication: Pt in room, family at bedside Disposition Plan: Uncertain at this time  Consultants:   Neurology  Procedures:      Antimicrobials: Anti-infectives    None      Subjective: No complaints at present  Objective: Vitals:   02/16/17 0700 02/16/17 0835 02/16/17 1000 02/16/17 1409  BP: (!)  111/53 108/86 107/75 (!) 110/56  Pulse: 72 65    Resp: 18 12  16   Temp: 98 F (36.7 C) (!) 97.5 F (36.4 C)  98.1 F (36.7 C)  TempSrc: Oral Oral  Oral  SpO2: 97% 97%  95%  Weight:      Height:        Intake/Output Summary (Last 24 hours) at 02/16/17 1742 Last data filed at 02/16/17 1100  Gross per 24 hour  Intake              360 ml  Output                0 ml  Net              360 ml   Filed Weights   02/15/17 2109  Weight: 66.3 kg (146 lb 1.6 oz)    Examination:  General exam: Appears calm and comfortable  Respiratory system: Clear to auscultation. Respiratory effort normal. Cardiovascular system: S1 & S2 heard, RRR.  Gastrointestinal system: Abdomen is nondistended, soft and nontender. No organomegaly or masses felt. Normal bowel sounds heard. Central nervous system: Alert and oriented. No focal neurological deficits. Extremities: Symmetric 5 x 5 power. Skin: No rashes, lesions  Psychiatry: Judgement and insight appear normal. Mood & affect appropriate.   Data Reviewed: I have personally reviewed following labs and imaging studies  CBC:  Recent Labs Lab 02/15/17 1712 02/15/17 1715  WBC 14.3*  --   NEUTROABS 10.4*  --   HGB 12.0 12.2  HCT  35.6* 36.0  MCV 90.1  --   PLT 477*  --    Basic Metabolic Panel:  Recent Labs Lab 02/15/17 1712 02/15/17 1715  NA 139 140  K 3.9 3.6  CL 103 101  CO2 26  --   GLUCOSE 230* 223*  BUN 19 21*  CREATININE 1.24* 1.20*  CALCIUM 9.0  --    GFR: Estimated Creatinine Clearance: 37.3 mL/min (A) (by C-G formula based on SCr of 1.2 mg/dL (H)). Liver Function Tests:  Recent Labs Lab 02/15/17 1712  AST 14*  ALT 11*  ALKPHOS 67  BILITOT 0.4  PROT 6.7  ALBUMIN 3.6   No results for input(s): LIPASE, AMYLASE in the last 168 hours. No results for input(s): AMMONIA in the last 168 hours. Coagulation Profile:  Recent Labs Lab 02/15/17 1712  INR 0.94   Cardiac Enzymes: No results for input(s): CKTOTAL, CKMB,  CKMBINDEX, TROPONINI in the last 168 hours. BNP (last 3 results) No results for input(s): PROBNP in the last 8760 hours. HbA1C: No results for input(s): HGBA1C in the last 72 hours. CBG:  Recent Labs Lab 02/15/17 1802  GLUCAP 186*   Lipid Profile:  Recent Labs  02/16/17 0347  CHOL 154  HDL 38*  LDLCALC 68  TRIG 241*  CHOLHDL 4.1   Thyroid Function Tests: No results for input(s): TSH, T4TOTAL, FREET4, T3FREE, THYROIDAB in the last 72 hours. Anemia Panel: No results for input(s): VITAMINB12, FOLATE, FERRITIN, TIBC, IRON, RETICCTPCT in the last 72 hours. Sepsis Labs: No results for input(s): PROCALCITON, LATICACIDVEN in the last 168 hours.  No results found for this or any previous visit (from the past 240 hour(s)).   Radiology Studies: Ct Angio Head W Or Wo Contrast  Result Date: 02/15/2017 CLINICAL DATA:  76 y/o F; left-sided headaches and left-sided blurred vision. Numbness to left hand. Symptoms resolve within 20 minutes. Patient had similar episode yesterday. EXAM: CT ANGIOGRAPHY HEAD AND NECK TECHNIQUE: Multidetector CT imaging of the head and neck was performed using the standard protocol during bolus administration of intravenous contrast. Multiplanar CT image reconstructions and MIPs were obtained to evaluate the vascular anatomy. Carotid stenosis measurements (when applicable) are obtained utilizing NASCET criteria, using the distal internal carotid diameter as the denominator. CONTRAST:  50 cc Isovue 370 COMPARISON:  02/15/2017 CT head.  06/27/2013 MRA head. FINDINGS: CTA NECK FINDINGS Aortic arch: Bovine arch, normal variant. Mild calcific atherosclerosis. No significant stenosis of the great vessel origins. Right carotid system: No evidence of dissection, stenosis (50% or greater) or occlusion. Mild non stenotic calcified plaque of the bifurcation. Left carotid system: No evidence of dissection, stenosis (50% or greater) or occlusion. Moderate mixed plaque of the carotid  bifurcation with 40% mild stenosis of proximal ICA and plaque ulceration. Vertebral arteries: Codominant. No evidence of dissection, stenosis (50% or greater) or occlusion. Mild non stenotic calcified plaque of right V1 segment. Skeleton: Reversal of cervical curvature without listhesis. Discogenic degenerative changes greatest at the C5-6 level with there is disc space narrowing and marginal osteophytes. No high-grade bony canal stenosis. Other neck: Negative. Upper chest: Right upper lobe spiculated in cavitary mass measuring 36 x 28 mm (series 5, image 174). Scattered 2-3 mm nodules in the left lung apex. Review of the MIP images confirms the above findings CTA HEAD FINDINGS Anterior circulation: No significant stenosis, proximal occlusion, aneurysm, or vascular malformation. Mild non stenotic calcified plaque of the carotid siphons. Posterior circulation: No significant stenosis, proximal occlusion, aneurysm, or vascular malformation. Venous sinuses: As  permitted by contrast timing, patent. Anatomic variants: No posterior communicating artery identified, likely hypoplastic or absent. Small anterior communicating artery. Delayed phase: No abnormal intracranial enhancement. Review of the MIP images confirms the above findings IMPRESSION: 1. Patent carotid and vertebral arteries. No dissection, aneurysm, or hemodynamically significant stenosis utilizing NASCET criteria. 2. Patent circle of Willis. No large vessel occlusion, aneurysm, or significant stenosis. 3. Moderate mixed plaque of the left carotid bifurcation with 40% mild proximal ICA stenosis and plaque ulceration. 4. Right upper lobe spiculated and cavitary mass measuring up to 3.6 cm likely representing primary lung cancer. CT of the chest for more complete evaluation is recommended as well as surgical consultation. PETCT can be obtained for staging after tissue sampling. These results were called by telephone at the time of interpretation on 02/15/2017 at  9:18 pm to Dr. Roland Rack , who verbally acknowledged these results. Electronically Signed   By: Kristine Garbe M.D.   On: 02/15/2017 21:20   Ct Head Wo Contrast  Result Date: 02/15/2017 CLINICAL DATA:  76 y/o F; left-sided headache and left-sided blurred vision. EXAM: CT HEAD WITHOUT CONTRAST TECHNIQUE: Contiguous axial images were obtained from the base of the skull through the vertex without intravenous contrast. COMPARISON:  09/08/2013 CT head FINDINGS: Brain: No evidence of acute infarction, hemorrhage, hydrocephalus, extra-axial collection or mass lesion/mass effect. Stable small chronic infarction in left superolateral frontal lobe. Stable small lucency in right posterior putamen probably representing a lacunar infarction. Mild chronic microvascular ischemic changes and parenchymal volume loss of the brain. Vascular: No hyperdense vessel identified. Moderate calcific plaque of the carotid siphons. Skull: Normal. Negative for fracture or focal lesion. Sinuses/Orbits: No acute finding. Other: None. IMPRESSION: 1. No acute intracranial abnormality identified. 2. Stable small chronic left superolateral frontal cortical infarction and right putamen chronic lacunar infarction. 3. Mild chronic microvascular ischemic changes and mild parenchymal volume loss of the brain. Electronically Signed   By: Kristine Garbe M.D.   On: 02/15/2017 17:59   Ct Angio Neck W Or Wo Contrast  Result Date: 02/15/2017 CLINICAL DATA:  76 y/o F; left-sided headaches and left-sided blurred vision. Numbness to left hand. Symptoms resolve within 20 minutes. Patient had similar episode yesterday. EXAM: CT ANGIOGRAPHY HEAD AND NECK TECHNIQUE: Multidetector CT imaging of the head and neck was performed using the standard protocol during bolus administration of intravenous contrast. Multiplanar CT image reconstructions and MIPs were obtained to evaluate the vascular anatomy. Carotid stenosis measurements (when  applicable) are obtained utilizing NASCET criteria, using the distal internal carotid diameter as the denominator. CONTRAST:  50 cc Isovue 370 COMPARISON:  02/15/2017 CT head.  06/27/2013 MRA head. FINDINGS: CTA NECK FINDINGS Aortic arch: Bovine arch, normal variant. Mild calcific atherosclerosis. No significant stenosis of the great vessel origins. Right carotid system: No evidence of dissection, stenosis (50% or greater) or occlusion. Mild non stenotic calcified plaque of the bifurcation. Left carotid system: No evidence of dissection, stenosis (50% or greater) or occlusion. Moderate mixed plaque of the carotid bifurcation with 40% mild stenosis of proximal ICA and plaque ulceration. Vertebral arteries: Codominant. No evidence of dissection, stenosis (50% or greater) or occlusion. Mild non stenotic calcified plaque of right V1 segment. Skeleton: Reversal of cervical curvature without listhesis. Discogenic degenerative changes greatest at the C5-6 level with there is disc space narrowing and marginal osteophytes. No high-grade bony canal stenosis. Other neck: Negative. Upper chest: Right upper lobe spiculated in cavitary mass measuring 36 x 28 mm (series 5, image 174). Scattered 2-3  mm nodules in the left lung apex. Review of the MIP images confirms the above findings CTA HEAD FINDINGS Anterior circulation: No significant stenosis, proximal occlusion, aneurysm, or vascular malformation. Mild non stenotic calcified plaque of the carotid siphons. Posterior circulation: No significant stenosis, proximal occlusion, aneurysm, or vascular malformation. Venous sinuses: As permitted by contrast timing, patent. Anatomic variants: No posterior communicating artery identified, likely hypoplastic or absent. Small anterior communicating artery. Delayed phase: No abnormal intracranial enhancement. Review of the MIP images confirms the above findings IMPRESSION: 1. Patent carotid and vertebral arteries. No dissection, aneurysm,  or hemodynamically significant stenosis utilizing NASCET criteria. 2. Patent circle of Willis. No large vessel occlusion, aneurysm, or significant stenosis. 3. Moderate mixed plaque of the left carotid bifurcation with 40% mild proximal ICA stenosis and plaque ulceration. 4. Right upper lobe spiculated and cavitary mass measuring up to 3.6 cm likely representing primary lung cancer. CT of the chest for more complete evaluation is recommended as well as surgical consultation. PETCT can be obtained for staging after tissue sampling. These results were called by telephone at the time of interpretation on 02/15/2017 at 9:18 pm to Dr. Roland Rack , who verbally acknowledged these results. Electronically Signed   By: Kristine Garbe M.D.   On: 02/15/2017 21:20   Mr Brain Wo Contrast  Result Date: 02/16/2017 CLINICAL DATA:  76 year old female with left side headache, left blurred vision. TIA symptoms. Spiculated right upper lung mass discovered on CTA yesterday. EXAM: MRI HEAD WITHOUT CONTRAST TECHNIQUE: Multiplanar, multiecho pulse sequences of the brain and surrounding structures were obtained without intravenous contrast. COMPARISON:  CTA head and neck 02/15/2017 and earlier, including brain MRI 07/24/2013. FINDINGS: Brain: Chronic encephalomalacia left middle frontal gyrus, with mildly progressed white matter gliosis since 2015. No restricted diffusion to suggest acute infarction. No midline shift, mass effect, ventriculomegaly, extra-axial collection or acute intracranial hemorrhage. Cervicomedullary junction and pituitary are within normal limits. No signal abnormality suspicious for vasogenic edema. Outside of the left middle frontal gyrus there are scattered small nonspecific T2 and FLAIR hyperintense foci. No other new signal abnormality. No other cortical encephalomalacia or chronic cerebral blood products. No contrast administered. Vascular: Major intracranial vascular flow voids are preserved  and appear stable since 2015. Skull and upper cervical spine: Normal visible bone marrow signal. Negative visualized cervical spine. Sinuses/Orbits: Stable and negative orbits soft tissues. Chronic right maxillary sinus mucosal thickening, increased mucosal thickening or small superimposed fluid level today. Other paranasal sinuses and mastoids are stable and well pneumatized. Other: Visible internal auditory structures appear normal. Scalp and face soft tissues appear negative. IMPRESSION: 1. No acute intracranial abnormality. Note that early metastatic disease to the brain cannot be excluded in the absence of intravenous contrast. 2. Chronic left middle frontal gyrus encephalomalacia. 3. Acute on chronic right maxillary sinusitis. Electronically Signed   By: Genevie Ann M.D.   On: 02/16/2017 14:03    Scheduled Meds: . aspirin EC  325 mg Oral Daily  . clopidogrel  75 mg Oral Daily  . enoxaparin (LOVENOX) injection  30 mg Subcutaneous Q24H  . lisinopril  20 mg Oral Daily   And  . hydrochlorothiazide  12.5 mg Oral Daily  . linagliptin  5 mg Oral Q breakfast   And  . [START ON 02/18/2017] metFORMIN  1,000 mg Oral BID WC  . rosuvastatin  20 mg Oral Daily   Continuous Infusions: . sodium chloride 10 mL/hr at 02/15/17 2341     LOS: 1 day   Drequan Ironside  Raliegh Ip, MD [facility] Pager [phone]  If 7PM-7AM, please contact night-coverage [email or web address] Password [email or web address] 02/16/2017, 5:42 PM

## 2017-02-16 NOTE — Evaluation (Signed)
Physical Therapy Evaluation Patient Details Name: [name] MRN: [id number] DOB: [birth year] Today's Date: 02/16/2017   History of Present Illness  76 y.o. F admitted with strokelike symptoms that pt states have now resolved. See admitting MD note for PMH.   Clinical Impression  Pt admitted with above diagnosis. Pt currently reports being back at baseline and mobility and balance testing reveal no deficits. I have answered all patient's question regarding PT and mobility.  No follow up PT needed.  Pt also reports vision is back to normal and no difficulty with reading.  Independent with all mobility.        Follow Up Recommendations No PT follow up    Equipment Recommendations  None recommended by PT    Recommendations for Other Services       Precautions / Restrictions Precautions Precautions: Fall Restrictions Weight Bearing Restrictions: No      Mobility  Bed Mobility Overal bed mobility: Independent                Transfers Overall transfer level: Independent                  Ambulation/Gait Ambulation/Gait assistance: Independent Ambulation Distance (Feet): 300 Feet Assistive device: None Gait Pattern/deviations: WFL(Within Functional Limits) Gait velocity: WNL Gait velocity interpretation: at or above normal speed for age/gender General Gait Details: no balance losses despite challenges to balance  Stairs            Wheelchair Mobility    Modified Rankin (Stroke Patients Only) Modified Rankin (Stroke Patients Only) Pre-Morbid Rankin Score: No symptoms Modified Rankin: No symptoms     Balance Overall balance assessment: Independent                               Standardized Balance Assessment Standardized Balance Assessment : Dynamic Gait Index   Dynamic Gait Index Level Surface: Normal Change in Gait Speed: Normal Gait with Horizontal Head Turns: Normal Gait with Vertical Head Turns: Normal Gait and Pivot  Turn: Mild Impairment Step Over Obstacle: Mild Impairment Step Around Obstacles: Normal Steps: Normal (based on other functional testing) Total Score: 22       Pertinent Vitals/Pain Pain Assessment: No/denies pain    Home Living Family/patient expects to be discharged to:: Private residence Living Arrangements: Spouse/significant other;Children Available Help at Discharge: Family Type of Home: House Home Access: Stairs to enter Entrance Stairs-Rails: None Technical brewer of Steps: 1 Home Layout: One level   Additional Comments: Pt reports being independent at home and takes walks on her [place]    Prior Function Level of Independence: Independent               Hand Dominance   Dominant Hand: Right    Extremity/Trunk Assessment   Upper Extremity Assessment Upper Extremity Assessment: Overall WFL for tasks assessed    Lower Extremity Assessment Lower Extremity Assessment: Overall WFL for tasks assessed (No weakness to MMT)    Cervical / Trunk Assessment Cervical / Trunk Assessment: Normal  Communication   Communication: No difficulties  Cognition Arousal/Alertness: Awake/alert Behavior During Therapy: WFL for tasks assessed/performed Overall Cognitive Status: Within Functional Limits for tasks assessed                                        General Comments General comments (skin integrity,  edema, etc.): Husband and daughter present for session    Exercises     Assessment/Plan    PT Assessment Patent does not need any further PT services  PT Problem List         PT Treatment Interventions      PT Goals (Current goals can be found in the Care Plan section)  Acute Rehab PT Goals Patient Stated Goal: to go home    Frequency     Barriers to discharge        Co-evaluation               AM-PAC PT "6 Clicks" Daily Activity  Outcome Measure                  End of Session Equipment Utilized During  Treatment: Gait belt Activity Tolerance: Patient tolerated treatment well Patient left: in bed;with call bell/phone within reach;with bed alarm set;with family/visitor present Nurse Communication: Mobility status PT Visit Diagnosis: Unsteadiness on feet (R26.81)    Time: 1655-3748 PT Time Calculation (min) (ACUTE ONLY): 18 min   Charges:   PT Evaluation $PT Eval Low Complexity: 1 Low     PT G CodesLavonia Dana, PT  [phone] 02/16/2017   Melvern Banker 02/16/2017, 9:46 AM

## 2017-02-16 NOTE — Progress Notes (Signed)
Arrived from ED at 2110. Alert and oriented. Denies any pain.  Family at bedside.

## 2017-02-16 NOTE — Progress Notes (Signed)
STROKE TEAM PROGRESS NOTE   HISTORY OF PRESENT ILLNESS (per record) [name] is a 76 y.o. female with a history of previous stroke, status post carotid endarterectomy on the left he presents with 2 episodes of left hand numbness, blurred vision on the left, difficulty walking that have occurred over the last 2 days. The first event occurred yesterday, and lasted for approximately 10 minutes. Following this, she returned to normal until today around 3:30 PM, she had recurrence of events and this time it lasted 30-40 minutes. She states that it was her left eye, but it is not clear to me if it was truly the left eye or left hemianopia. She states that when she tried to walk, and ran into the door she was trying to walk through with her left side.  She denies slurred speech, headache, weakness, or other symptoms.  She has a previous history of stroke, and sometimes does have some difficult with her speech, particularly when she is tired.  LKW: Yesterday tpa given?: no, resolve symptoms NIHSS: 0  SUBJECTIVE (INTERVAL HISTORY) No symptoms.  No cigarette craving.     OBJECTIVE Temp:  [97.5 F (36.4 C)-98.6 F (37 C)] 97.5 F (36.4 C) (08/04 0835) Pulse Rate:  [65-86] 65 (08/04 0835) Cardiac Rhythm: Normal sinus rhythm (08/04 0728) Resp:  [12-18] 12 (08/04 0835) BP: (98-139)/(48-86) 107/75 (08/04 1000) SpO2:  [94 %-98 %] 97 % (08/04 0835) Weight:  [146 lb 1.6 oz (66.3 kg)] 146 lb 1.6 oz (66.3 kg) (08/03 2109)  CBC:   Recent Labs Lab 02/15/17 1712 02/15/17 1715  WBC 14.3*  --   NEUTROABS 10.4*  --   HGB 12.0 12.2  HCT 35.6* 36.0  MCV 90.1  --   PLT 477*  --     Basic Metabolic Panel:   Recent Labs Lab 02/15/17 1712 02/15/17 1715  NA 139 140  K 3.9 3.6  CL 103 101  CO2 26  --   GLUCOSE 230* 223*  BUN 19 21*  CREATININE 1.24* 1.20*  CALCIUM 9.0  --     Lipid Panel:     Component Value Date/Time   CHOL 154 02/16/2017 0347   TRIG 241 (H) 02/16/2017 0347    HDL 38 (L) 02/16/2017 0347   CHOLHDL 4.1 02/16/2017 0347   VLDL 48 (H) 02/16/2017 0347   LDLCALC 68 02/16/2017 0347   HgbA1c:  Lab Results  Component Value Date   HGBA1C 8.3 11/12/2016   Urine Drug Screen:     Component Value Date/Time   LABOPIA NONE DETECTED 07/25/2013 0952   COCAINSCRNUR NONE DETECTED 07/25/2013 0952   LABBENZ NONE DETECTED 07/25/2013 0952   AMPHETMU NONE DETECTED 07/25/2013 0952   THCU NONE DETECTED 07/25/2013 0952   LABBARB NONE DETECTED 07/25/2013 0952    Alcohol Level     Component Value Date/Time   ETH <11 07/25/2013 0358    IMAGING  Ct Angio Head W Or Wo Contrast Ct Angio Neck W Or Wo Contrast  02/15/2017 1. Patent carotid and vertebral arteries. No dissection, aneurysm, or hemodynamically significant stenosis utilizing NASCET criteria.  2. Patent circle of Willis. No large vessel occlusion, aneurysm, or significant stenosis.  3. Moderate mixed plaque of the left carotid bifurcation with 40% mild proximal ICA stenosis and plaque ulceration.  4. Right upper lobe spiculated and cavitary mass measuring up to 3.6 cm likely representing primary lung cancer.  CT of the chest for more complete evaluation is recommended as well as surgical consultation. PETCT  can be obtained for staging after tissue sampling.     Ct Head Wo Contrast 02/15/2017 1. No acute intracranial abnormality identified.  2. Stable small chronic left superolateral frontal cortical infarction and right putamen chronic lacunar infarction.  3. Mild chronic microvascular ischemic changes and mild parenchymal volume loss of the brain.    MRI Brain Wo Contrast - Pending       PHYSICAL EXAM   ASSESSMENT/PLAN [name] is a 76 y.o. female with history of previous stroke, tobacco use, diabetic neuropathy, hypertension, hyperlipidemia, diabetes mellitus, and carotid artery disease presenting with transient left hand numbness, blurred vision, and difficulty walking. She did not  receive IV t-PA due to resolution of deficits.  TIA:    Resultant  - left arm and possibly left leg numbness.    CT head - No acute intracranial abnormality identified.   CTA H&N - Rt upper lobe spiculated and cavitary mass likely representing primary lung cancer.   MRI head - pending  MRA head - not performed  Carotid Doppler - CTA neck  2D Echo - pending  LDL - 68  HgbA1c pending  VTE prophylaxis - Lovenox Diet heart healthy/carb modified Room service appropriate? Yes; Fluid consistency: Thin  clopidogrel 75 mg daily prior to admission, now on aspirin 325 mg daily and clopidogrel 75 mg daily  Patient counseled to be compliant with her antithrombotic medications  Ongoing aggressive stroke risk factor management  Therapy recommendations:  No follow-up PT recommended. OT evaluation is pending.  Disposition: Pending  Hypertension  Blood pressure currently running low on antihypertensive medications -> discontinue verapamil  Permissive hypertension (OK if < 220/120) but gradually normalize in 5-7 days  Long-term BP goal normotensive  Hyperlipidemia  Home meds:  Crestor 20 mg daily resumed in hospital  LDL 68, goal < 70  Continue statin at discharge  Diabetes  HgbA1c pending, goal < 7.0   Unc / Controlled  Other Stroke Risk Factors  Advanced age  Cigarette smoker - advised to stop smoking  Hx stroke/TIA   Other Active Problems  Right upper lobe spiculated and cavitary mass measuring up to 3.6 cm likely representing primary lung cancer. CT of the chest for more complete evaluation is recommended as well as surgical consultation. PETCT can be obtained for staging after tissue sampling.   Creatinine 1.20  Leukocytosis  Aspirin and iodine allergies  Hospital day # 1  Attending Addendum:  S/P 2 TIA episodes involving left arm numbness and ? Left leg weakness/numbness causing gait impairment.  Currently back to baseline.  Awaiting MRI Brain.   ASA  added to Plavix for now.    CTA Brain and Neck are negative, but incident RUL mass seen- need to address soon with CT chest.  She is a smoker - nicotine patch 21 mg/day if her cravings become severe.    TTE pending.  Tele NSR so far. Cholesterol is well controlled.  DM is poorly controlled, but awaiting HgA1c.    BP is rather low for someone with recurrent TIAs - she is on 3 BP meds.  I will discontinue the CCB and will continue the diuretic and ACEI for now.    Rogue Jury, MS, MD        To contact Stroke Continuity provider, please refer to [email or web address] After hours, contact General Neurology

## 2017-02-17 ENCOUNTER — Observation Stay (HOSPITAL_BASED_OUTPATIENT_CLINIC_OR_DEPARTMENT_OTHER): Payer: Medicare Other

## 2017-02-17 ENCOUNTER — Telehealth: Payer: Self-pay | Admitting: Emergency Medicine

## 2017-02-17 DIAGNOSIS — Z8673 Personal history of transient ischemic attack (TIA), and cerebral infarction without residual deficits: Secondary | ICD-10-CM

## 2017-02-17 DIAGNOSIS — R918 Other nonspecific abnormal finding of lung field: Secondary | ICD-10-CM | POA: Diagnosis not present

## 2017-02-17 DIAGNOSIS — E1136 Type 2 diabetes mellitus with diabetic cataract: Secondary | ICD-10-CM | POA: Diagnosis not present

## 2017-02-17 DIAGNOSIS — G459 Transient cerebral ischemic attack, unspecified: Secondary | ICD-10-CM

## 2017-02-17 DIAGNOSIS — E1169 Type 2 diabetes mellitus with other specified complication: Secondary | ICD-10-CM | POA: Diagnosis not present

## 2017-02-17 LAB — ECHOCARDIOGRAM COMPLETE
AVLVOTPG: 7 mmHg
CHL CUP DOP CALC LVOT VTI: 25.5 cm
CHL CUP MV DEC (S): 246
E/e' ratio: 14.88
EWDT: 246 ms
FS: 35 % (ref 28–44)
HEIGHTINCHES: 66 in
IV/PV OW: 0.82
LA ID, A-P, ES: 34 mm
LA diam index: 1.94 cm/m2
LA vol A4C: 26.8 ml
LA vol: 28.7 mL
LAVOLIN: 16.4 mL/m2
LDCA: 2.84 cm2
LEFT ATRIUM END SYS DIAM: 34 mm
LV E/e' medial: 14.88
LV E/e'average: 14.88
LV PW d: 11 mm — AB (ref 0.6–1.1)
LV TDI E'LATERAL: 6.25
LV TDI E'MEDIAL: 6.25
LV e' LATERAL: 6.25 cm/s
LVOT SV: 72 mL
LVOTD: 19 mm
LVOTPV: 132 cm/s
Lateral S' vel: 9.75 cm/s
MV Peak grad: 3 mmHg
MV pk A vel: 92.4 m/s
MV pk E vel: 93 m/s
TAPSE: 20 mm
Weight: 2337.6 oz

## 2017-02-17 LAB — GLUCOSE, CAPILLARY
GLUCOSE-CAPILLARY: 223 mg/dL — AB (ref 65–99)
Glucose-Capillary: 99 mg/dL (ref 65–99)

## 2017-02-17 LAB — BASIC METABOLIC PANEL
Anion gap: 8 (ref 5–15)
BUN: 17 mg/dL (ref 6–20)
CALCIUM: 8.7 mg/dL — AB (ref 8.9–10.3)
CO2: 26 mmol/L (ref 22–32)
Chloride: 103 mmol/L (ref 101–111)
Creatinine, Ser: 1.05 mg/dL — ABNORMAL HIGH (ref 0.44–1.00)
GFR, EST AFRICAN AMERICAN: 58 mL/min — AB (ref >60)
GFR, EST NON AFRICAN AMERICAN: 50 mL/min — AB (ref >60)
GLUCOSE: 163 mg/dL — AB (ref 65–99)
Potassium: 3.8 mmol/L (ref 3.5–5.1)
SODIUM: 137 mmol/L (ref 135–145)

## 2017-02-17 LAB — HEMOGLOBIN A1C
HEMOGLOBIN A1C: 8.4 % — AB (ref 4.8–5.6)
Mean Plasma Glucose: 194 mg/dL

## 2017-02-17 MED ORDER — LISINOPRIL 10 MG PO TABS: 10.0000 mg | ORAL_TABLET | Freq: Every day | ORAL | Status: DC

## 2017-02-17 MED ORDER — NICOTINE 14 MG/24HR TD PT24: 14.0000 mg | MEDICATED_PATCH | Freq: Every day | TRANSDERMAL | 0 refills | Status: DC

## 2017-02-17 MED ORDER — NICOTINE 14 MG/24HR TD PT24: 14.0000 mg | MEDICATED_PATCH | Freq: Every day | TRANSDERMAL | Status: DC

## 2017-02-17 MED ORDER — ENOXAPARIN SODIUM 40 MG/0.4ML ~~LOC~~ SOLN: 40.0000 mg | SUBCUTANEOUS | Status: DC

## 2017-02-17 MED ORDER — HYDROCHLOROTHIAZIDE 12.5 MG PO CAPS: 12.5000 mg | ORAL_CAPSULE | Freq: Every day | ORAL | Status: DC

## 2017-02-17 MED ORDER — ASPIRIN EC 81 MG PO TBEC: 81.0000 mg | DELAYED_RELEASE_TABLET | Freq: Every day | ORAL | Status: DC

## 2017-02-17 MED ORDER — ASPIRIN 81 MG PO TBEC: 81.0000 mg | DELAYED_RELEASE_TABLET | Freq: Every day | ORAL | 0 refills | Status: DC

## 2017-02-17 NOTE — Progress Notes (Signed)
  Echocardiogram 2D Echocardiogram has been performed.  [name] 02/17/2017, 9:05 AM

## 2017-02-17 NOTE — Progress Notes (Signed)
Patient discharged home. Discharged instructions were reviewed with the patient. Patient verbalized understanding .

## 2017-02-17 NOTE — Progress Notes (Signed)
STROKE TEAM PROGRESS NOTE   Attending addendum:  No acute infarct on MRI.  TIAs have resolved.  Normal exam.  ASA decrease to 81 mg qd in combination with Plavix 75 mg qd for 90 days.  After that, she should be transitioned to Plavix monotherapy.   There is more danger of bleeding into lung mass with dual therapy for too long.  May D/C home to follow up at stroke clinic.   Rogue Jury, MS, MD     HISTORY OF PRESENT ILLNESS (per record) [name] is a 76 y.o. female with a history of previous stroke, status post carotid endarterectomy on the left he presents with 2 episodes of left hand numbness, blurred vision on the left, difficulty walking that have occurred over the last 2 days. The first event occurred yesterday, and lasted for approximately 10 minutes. Following this, she returned to normal until today around 3:30 PM, she had recurrence of events and this time it lasted 30-40 minutes. She states that it was her left eye, but it is not clear to me if it was truly the left eye or left hemianopia. She states that when she tried to walk, and ran into the door she was trying to walk through with her left side.  She denies slurred speech, headache, weakness, or other symptoms.  She has a previous history of stroke, and sometimes does have some difficult with her speech, particularly when she is tired.  LKW: Yesterday tpa given?: no, resolve symptoms NIHSS: 0  SUBJECTIVE (INTERVAL HISTORY) No symptoms.  No cigarette craving.     OBJECTIVE Temp:  [98.1 F (36.7 C)-98.6 F (37 C)] 98.2 F (36.8 C) (08/05 0937) Pulse Rate:  [67-78] 69 (08/05 0937) Cardiac Rhythm: Normal sinus rhythm;Bundle branch block (08/05 0700) Resp:  [16-18] 16 (08/05 0937) BP: (86-128)/(50-71) 128/50 (08/05 0937) SpO2:  [95 %-98 %] 98 % (08/05 0937)  CBC:   Recent Labs Lab 02/15/17 1712 02/15/17 1715  WBC 14.3*  --   NEUTROABS 10.4*  --   HGB 12.0 12.2  HCT 35.6* 36.0  MCV 90.1  --   PLT  477*  --     Basic Metabolic Panel:   Recent Labs Lab 02/15/17 1712 02/15/17 1715 02/17/17 0422  NA 139 140 137  K 3.9 3.6 3.8  CL 103 101 103  CO2 26  --  26  GLUCOSE 230* 223* 163*  BUN 19 21* 17  CREATININE 1.24* 1.20* 1.05*  CALCIUM 9.0  --  8.7*    Lipid Panel:     Component Value Date/Time   CHOL 154 02/16/2017 0347   TRIG 241 (H) 02/16/2017 0347   HDL 38 (L) 02/16/2017 0347   CHOLHDL 4.1 02/16/2017 0347   VLDL 48 (H) 02/16/2017 0347   LDLCALC 68 02/16/2017 0347   HgbA1c:  Lab Results  Component Value Date   HGBA1C 8.4 (H) 02/16/2017   Urine Drug Screen:     Component Value Date/Time   LABOPIA NONE DETECTED 07/25/2013 0952   COCAINSCRNUR NONE DETECTED 07/25/2013 0952   LABBENZ NONE DETECTED 07/25/2013 0952   AMPHETMU NONE DETECTED 07/25/2013 0952   THCU NONE DETECTED 07/25/2013 0952   LABBARB NONE DETECTED 07/25/2013 0952    Alcohol Level     Component Value Date/Time   ETH <11 07/25/2013 0358    IMAGING  Ct Angio Head W Or Wo Contrast Ct Angio Neck W Or Wo Contrast  02/15/2017 1. Patent carotid and vertebral arteries. No dissection, aneurysm,  or hemodynamically significant stenosis utilizing NASCET criteria.  2. Patent circle of Willis. No large vessel occlusion, aneurysm, or significant stenosis.  3. Moderate mixed plaque of the left carotid bifurcation with 40% mild proximal ICA stenosis and plaque ulceration.  4. Right upper lobe spiculated and cavitary mass measuring up to 3.6 cm likely representing primary lung cancer.  CT of the chest for more complete evaluation is recommended as well as surgical consultation. PETCT can be obtained for staging after tissue sampling.     Ct Head Wo Contrast 02/15/2017 1. No acute intracranial abnormality identified.  2. Stable small chronic left superolateral frontal cortical infarction and right putamen chronic lacunar infarction.  3. Mild chronic microvascular ischemic changes and mild parenchymal volume  loss of the brain.     Mr Brain Wo Contrast 02/16/2017 1. No acute intracranial abnormality.      Note that early metastatic disease to the brain cannot be excluded in the absence of intravenous contrast.  2. Chronic left middle frontal gyrus encephalomalacia.  3. Acute on chronic right maxillary sinusitis.     CT Abdomen and Pelvis w Contrast 02/16/2017 Multilobulated mildly spiculated mass at the right upper lung lobe, measuring 3.3 x 2.7 x 3.2 cm. Associated cavitation noted. This is concerning for primary bronchogenic malignancy. Pulmonary consultation is recommended for tissue diagnosis, as deemed clinically appropriate. 2. No definite evidence of metastatic disease. 3. Question of enhancing 1.6 cm nodule near the upper pole of the left kidney on sagittal images. Though this may simply reflect underlying scarring and normal renal parenchyma, renal cell carcinoma cannot be excluded. MRI of the abdomen with contrast could be considered for further evaluation. 4. Scattered aortic atherosclerosis. 5. Diffuse coronary artery calcifications noted. 6. Scattered diverticulosis along the descending and sigmoid colon, without evidence of diverticulitis. 7. Scattered bilateral renal cysts seen.     PHYSICAL EXAM  Normal neuro exam   ASSESSMENT/PLAN [name] is a 76 y.o. female with history of previous stroke, tobacco use, diabetic neuropathy, hypertension, hyperlipidemia, diabetes mellitus, and carotid artery disease presenting with transient left hand numbness, blurred vision, and difficulty walking. She did not receive IV t-PA due to resolution of deficits.  TIA:    Resultant  - left arm and possibly left leg numbness.    CT head - No acute intracranial abnormality identified.   CTA H&N - Rt upper lobe spiculated and cavitary mass likely representing primary lung cancer.   MRI head - see above  MRA head - not performed  Carotid Doppler - CTA neck  2D Echo  - pending  LDL - 68  HgbA1c - 8.4  VTE prophylaxis - Lovenox Diet heart healthy/carb modified Room service appropriate? Yes; Fluid consistency: Thin  clopidogrel 75 mg daily prior to admission, now on aspirin 325 mg daily and clopidogrel 75 mg daily  Patient counseled to be compliant with her antithrombotic medications  Ongoing aggressive stroke risk factor management  Therapy recommendations:  No follow-up PT or OT recommended.   Disposition: Pending  Hypertension  Blood pressure currently running low on antihypertensive medications -> discontinue verapamil  Permissive hypertension (OK if < 220/120) but gradually normalize in 5-7 days  Long-term BP goal normotensive  Hyperlipidemia  Home meds:  Crestor 20 mg daily resumed in hospital  LDL 68, goal < 70  Continue statin at discharge  Diabetes  HgbA1c 8.4, goal < 7.0   Unc / Controlled  Other Stroke Risk Factors  Advanced age  Cigarette smoker - advised  to stop smoking  Hx stroke/TIA   Other Active Problems  Right upper lobe spiculated and cavitary mass measuring up to 3.6 cm likely representing primary lung cancer. CT of the chest for more complete evaluation is recommended as well as surgical consultation. PETCT can be obtained for staging after tissue sampling.   Creatinine 1.20  Leukocytosis  Aspirin and iodine allergies   PLAN  ASA 81 mg and Plavix 75 mg for 3 months then Plavix alone  F/U Dr Leonie Man 2 months  F/U pulmonary for lung mass.  Hospital day # 1  Attending Addendum:  S/P 2 TIA episodes involving left arm numbness and ? Left leg weakness/numbness causing gait impairment.  Currently back to baseline.  Awaiting MRI Brain.   ASA added to Plavix for now.    CTA Brain and Neck are negative, but incident RUL mass seen- need to address soon with CT chest.  She is a smoker - nicotine patch 21 mg/day if her cravings become severe.    TTE pending.  Tele NSR so far. Cholesterol is well  controlled.  DM is poorly controlled, but awaiting HgA1c.    BP is rather low for someone with recurrent TIAs - she is on 3 BP meds.  I will discontinue the CCB and will continue the diuretic and ACEI for now.            To contact Stroke Continuity provider, please refer to [email or web address] After hours, contact General Neurology

## 2017-02-17 NOTE — Telephone Encounter (Signed)
Reviewed case with Dr Wyline Copas. She was admitted for r/o CVA, found to have incidental spiculated RUL mass. Being d/c from [facility] today 8/5.   Please set her up with [place] as a new consult ASAP, any provider. Will need PET, PFT's, eval of her functional capacity to determine whether she can be referred to [facility] for a primary surgical resection. If not a primary surgical candidate then would be a reasonable candidate for ENB.   Thanks

## 2017-02-17 NOTE — Discharge Summary (Signed)
Physician Discharge Summary  [name] RSW:[id number] DOB: Feb 20, 1941 DOA: 02/15/2017  PCP: [name], MD  Admit date: 02/15/2017 Discharge date: 02/17/2017  Admitted From: Home Disposition:  Home  Recommendations for Outpatient Follow-up:  1. Follow up with PCP in 1-2 weeks 2. Follow up with Neurology in 2 months 3. Follow up with Pulmonary as scheduled  Discharge Condition:Stable CODE STATUS:Full Diet recommendation: Heart healthy   Brief/Interim Summary: 76 y.o.female,With past medical history significant for CVA ,peripheral vascular disease status post left endarterectomy in the past,being followed by ultrasound of left carotid artery yearly, last one on March 2018, presenting with an episode of questionable left sided blurring of vision versus hemianopsia followed by dizziness and unsteadiness. Today she hit the side of the door while leaving a store. Patient denies any loss of consciousness or irregular heartbeats. She still smokes .CT of the head did not show any acute events   1. TIA 1. MRI reviewed. No evidence of acute infarct 2. Symptoms resolved 3. Neurology following 4. 2d echo unremarkable 5. Neurology recommendations for aspirin 81 mg daily and clopidogrel 75 mg daily x 90 days, then transition to plavix monotherapy afterwards 6. Recommendation for crestor 70m daily 2. HTN 1. BP currently stable and controlled 2. Cont current regimen 3. DM2 1. a1c pending 2. Glucose in the 200's 3. Cont on SSI coverage while in hospital 4. Lung nodule 1. 3.6x2.8cm RUL cavitary mass noted incidentally on CT angio neck 2. Cavitary lung lesion confirmed on formal CT chest. No evidence of metastatic spread on abd/pelvic CT and MRI brain 3. Discussed case with Dr. BLamonte Sakaiwho recommends outpatient PET scan, PFT's and possible surgical referral. Informed patient and family plan. Pulmonary to contact patient to schedule close outpatient follow up. 5. HLD 1. Stable at  present 2. Cont on crestor 250mdaily per neurology  Discharge Diagnoses:  Principal Problem:   TIA (transient ischemic attack) Active Problems:   DM type 2 with diabetic dyslipidemia ([place]  Cataract associated with type 2 diabetes mellitus ([place]  Hyperlipidemia associated with type 2 diabetes mellitus ([facility]  Lung mass    Discharge Instructions  Discharge Instructions    Ambulatory referral to Neurology    Complete by:  As directed    An appointment is requested in approximately: 6-8 weeks     Allergies as of 02/17/2017      Reactions   Codeine Nausea And Vomiting   Lipitor [atorvastatin] Nausea And Vomiting   Phenergan [promethazine Hcl] Other (See Comments)   Confusion, hallucinations, severe agitation      Medication List    STOP taking these medications   verapamil 240 MG CR tablet Commonly known as:  CALAN-SR     TAKE these medications   ACCU-CHEK AVIVA PLUS test strip Generic drug:  glucose blood CHECK BLOOD SUGAR TWO TIMES DAILY   ACCU-CHEK AVIVA PLUS w/Device Kit 1 kit by Does not apply route 2 (two) times daily.   ACCU-CHEK SOFTCLIX LANCETS lancets Patient is to test two times a day DX E11.9   aspirin 81 MG EC tablet Take 1 tablet (81 mg total) by mouth daily.   clopidogrel 75 MG tablet Commonly known as:  PLAVIX TAKE 1 TABLET BY MOUTH  DAILY What changed:  See the new instructions.   JANUMET 50-1000 MG tablet Generic drug:  sitaGLIPtin-metformin take 1 tablet twice a day with food What changed:  See the new instructions.   lisinopril-hydrochlorothiazide 20-12.5 MG tablet Commonly known as:  PRINZIDE,ZESTORETIC TAKE 1 TABLET BY MOUTH  DAILY What changed:  See the new instructions.   nicotine 14 mg/24hr patch Commonly known as:  NICODERM CQ - dosed in mg/24 hours Place 1 patch (14 mg total) onto the skin daily.   rosuvastatin 20 MG tablet Commonly known as:  CRESTOR take 1 tablet by mouth once daily What changed:  See the new  instructions.      Follow-up Information    Garvin Fila, MD. Schedule an appointment as soon as possible for a visit in 2 month(s).   Specialties:  Neurology, Radiology Why:  Hospital follow up. Contact information: [address] [place] [address] [phone]        [name], MD. Schedule an appointment as soon as possible for a visit in 2 week(s).   Specialty:  Family Medicine Contact information: [address] [place]  [address] [phone]        Follow up with Pulmonary as scheduled. Schedule an appointment as soon as possible for a visit.          Allergies  Allergen Reactions  . Codeine Nausea And Vomiting  . Lipitor [Atorvastatin] Nausea And Vomiting  . Phenergan [Promethazine Hcl] Other (See Comments)    Confusion, hallucinations, severe agitation    Consultations:  Neurology  Discussed case with Dr. Lamonte Sakai (Pulmonary)  Procedures/Studies: Ct Angio Head W Or Wo Contrast  Result Date: 02/15/2017 CLINICAL DATA:  76 y/o F; left-sided headaches and left-sided blurred vision. Numbness to left hand. Symptoms resolve within 20 minutes. Patient had similar episode yesterday. EXAM: CT ANGIOGRAPHY HEAD AND NECK TECHNIQUE: Multidetector CT imaging of the head and neck was performed using the standard protocol during bolus administration of intravenous contrast. Multiplanar CT image reconstructions and MIPs were obtained to evaluate the vascular anatomy. Carotid stenosis measurements (when applicable) are obtained utilizing NASCET criteria, using the distal internal carotid diameter as the denominator. CONTRAST:  50 cc Isovue 370 COMPARISON:  02/15/2017 CT head.  06/27/2013 MRA head. FINDINGS: CTA NECK FINDINGS Aortic arch: Bovine arch, normal variant. Mild calcific atherosclerosis. No significant stenosis of the great vessel origins. Right carotid system: No evidence of dissection, stenosis (50% or greater) or occlusion. Mild non  stenotic calcified plaque of the bifurcation. Left carotid system: No evidence of dissection, stenosis (50% or greater) or occlusion. Moderate mixed plaque of the carotid bifurcation with 40% mild stenosis of proximal ICA and plaque ulceration. Vertebral arteries: Codominant. No evidence of dissection, stenosis (50% or greater) or occlusion. Mild non stenotic calcified plaque of right V1 segment. Skeleton: Reversal of cervical curvature without listhesis. Discogenic degenerative changes greatest at the C5-6 level with there is disc space narrowing and marginal osteophytes. No high-grade bony canal stenosis. Other neck: Negative. Upper chest: Right upper lobe spiculated in cavitary mass measuring 36 x 28 mm (series 5, image 174). Scattered 2-3 mm nodules in the left lung apex. Review of the MIP images confirms the above findings CTA HEAD FINDINGS Anterior circulation: No significant stenosis, proximal occlusion, aneurysm, or vascular malformation. Mild non stenotic calcified plaque of the carotid siphons. Posterior circulation: No significant stenosis, proximal occlusion, aneurysm, or vascular malformation. Venous sinuses: As permitted by contrast timing, patent. Anatomic variants: No posterior communicating artery identified, likely hypoplastic or absent. Small anterior communicating artery. Delayed phase: No abnormal intracranial enhancement. Review of the MIP images confirms the above findings IMPRESSION: 1. Patent carotid and vertebral arteries. No dissection, aneurysm, or hemodynamically significant stenosis utilizing NASCET criteria. 2. Patent  circle of Willis. No large vessel occlusion, aneurysm, or significant stenosis. 3. Moderate mixed plaque of the left carotid bifurcation with 40% mild proximal ICA stenosis and plaque ulceration. 4. Right upper lobe spiculated and cavitary mass measuring up to 3.6 cm likely representing primary lung cancer. CT of the chest for more complete evaluation is recommended as  well as surgical consultation. PETCT can be obtained for staging after tissue sampling. These results were called by telephone at the time of interpretation on 02/15/2017 at 9:18 pm to Dr. Roland Rack , who verbally acknowledged these results. Electronically Signed   By: Kristine Garbe M.D.   On: 02/15/2017 21:20   Ct Head Wo Contrast  Result Date: 02/15/2017 CLINICAL DATA:  76 y/o F; left-sided headache and left-sided blurred vision. EXAM: CT HEAD WITHOUT CONTRAST TECHNIQUE: Contiguous axial images were obtained from the base of the skull through the vertex without intravenous contrast. COMPARISON:  09/08/2013 CT head FINDINGS: Brain: No evidence of acute infarction, hemorrhage, hydrocephalus, extra-axial collection or mass lesion/mass effect. Stable small chronic infarction in left superolateral frontal lobe. Stable small lucency in right posterior putamen probably representing a lacunar infarction. Mild chronic microvascular ischemic changes and parenchymal volume loss of the brain. Vascular: No hyperdense vessel identified. Moderate calcific plaque of the carotid siphons. Skull: Normal. Negative for fracture or focal lesion. Sinuses/Orbits: No acute finding. Other: None. IMPRESSION: 1. No acute intracranial abnormality identified. 2. Stable small chronic left superolateral frontal cortical infarction and right putamen chronic lacunar infarction. 3. Mild chronic microvascular ischemic changes and mild parenchymal volume loss of the brain. Electronically Signed   By: Kristine Garbe M.D.   On: 02/15/2017 17:59   Ct Angio Neck W Or Wo Contrast  Result Date: 02/15/2017 CLINICAL DATA:  76 y/o F; left-sided headaches and left-sided blurred vision. Numbness to left hand. Symptoms resolve within 20 minutes. Patient had similar episode yesterday. EXAM: CT ANGIOGRAPHY HEAD AND NECK TECHNIQUE: Multidetector CT imaging of the head and neck was performed using the standard protocol during bolus  administration of intravenous contrast. Multiplanar CT image reconstructions and MIPs were obtained to evaluate the vascular anatomy. Carotid stenosis measurements (when applicable) are obtained utilizing NASCET criteria, using the distal internal carotid diameter as the denominator. CONTRAST:  50 cc Isovue 370 COMPARISON:  02/15/2017 CT head.  06/27/2013 MRA head. FINDINGS: CTA NECK FINDINGS Aortic arch: Bovine arch, normal variant. Mild calcific atherosclerosis. No significant stenosis of the great vessel origins. Right carotid system: No evidence of dissection, stenosis (50% or greater) or occlusion. Mild non stenotic calcified plaque of the bifurcation. Left carotid system: No evidence of dissection, stenosis (50% or greater) or occlusion. Moderate mixed plaque of the carotid bifurcation with 40% mild stenosis of proximal ICA and plaque ulceration. Vertebral arteries: Codominant. No evidence of dissection, stenosis (50% or greater) or occlusion. Mild non stenotic calcified plaque of right V1 segment. Skeleton: Reversal of cervical curvature without listhesis. Discogenic degenerative changes greatest at the C5-6 level with there is disc space narrowing and marginal osteophytes. No high-grade bony canal stenosis. Other neck: Negative. Upper chest: Right upper lobe spiculated in cavitary mass measuring 36 x 28 mm (series 5, image 174). Scattered 2-3 mm nodules in the left lung apex. Review of the MIP images confirms the above findings CTA HEAD FINDINGS Anterior circulation: No significant stenosis, proximal occlusion, aneurysm, or vascular malformation. Mild non stenotic calcified plaque of the carotid siphons. Posterior circulation: No significant stenosis, proximal occlusion, aneurysm, or vascular malformation. Venous sinuses: As permitted  by contrast timing, patent. Anatomic variants: No posterior communicating artery identified, likely hypoplastic or absent. Small anterior communicating artery. Delayed phase:  No abnormal intracranial enhancement. Review of the MIP images confirms the above findings IMPRESSION: 1. Patent carotid and vertebral arteries. No dissection, aneurysm, or hemodynamically significant stenosis utilizing NASCET criteria. 2. Patent circle of Willis. No large vessel occlusion, aneurysm, or significant stenosis. 3. Moderate mixed plaque of the left carotid bifurcation with 40% mild proximal ICA stenosis and plaque ulceration. 4. Right upper lobe spiculated and cavitary mass measuring up to 3.6 cm likely representing primary lung cancer. CT of the chest for more complete evaluation is recommended as well as surgical consultation. PETCT can be obtained for staging after tissue sampling. These results were called by telephone at the time of interpretation on 02/15/2017 at 9:18 pm to Dr. Roland Rack , who verbally acknowledged these results. Electronically Signed   By: Kristine Garbe M.D.   On: 02/15/2017 21:20   Ct Chest W Contrast  Result Date: 02/16/2017 CLINICAL DATA:  Assess right-sided lung nodule. Staging for malignancy. Initial encounter. EXAM: CT CHEST, ABDOMEN, AND PELVIS WITH CONTRAST TECHNIQUE: Multidetector CT imaging of the chest, abdomen and pelvis was performed following the standard protocol during bolus administration of intravenous contrast. CONTRAST:  61m ISOVUE-300 IOPAMIDOL (ISOVUE-300) INJECTION 61% COMPARISON:  None. FINDINGS: CT CHEST FINDINGS Cardiovascular: Diffuse coronary artery calcifications are seen. The heart remains normal in size. Scattered calcification is noted along the aortic arch. The great vessels are unremarkable in appearance. Mediastinum/Nodes: Visualized mediastinal nodes remain normal in size. No pericardial effusion is identified. The visualized portions of the thyroid gland are unremarkable. No axillary lymphadenopathy is seen. Lungs/Pleura: There is a multilobulated mildly spiculated mass at the right upper lobe, measuring 3.3 x 2.7 x 3.2  cm. There appears to be some degree of associated cavitation. This is concerning for primary bronchogenic malignancy. No definite satellite nodules are identified. A tiny calcified granuloma is noted at the right lung apex. No pleural effusion or pneumothorax is identified. Musculoskeletal: No acute osseous abnormalities are identified. The visualized musculature is unremarkable in appearance. CT ABDOMEN PELVIS FINDINGS Hepatobiliary: The liver is unremarkable in appearance. The patient is status post cholecystectomy, with clips noted at the gallbladder fossa. The common bile duct remains normal in caliber. Pancreas: The pancreas is within normal limits. Spleen: A vague 1.3 cm hypodensity in the periphery of the spleen appears to be posttraumatic in nature, given its distribution. Adrenals/Urinary Tract: There is slight nodularity of the left adrenal gland, without a definite mass. The adrenal glands are otherwise grossly unremarkable in appearance. There appears to be an enhancing 1.6 cm nodule near the upper pole of the left kidney on sagittal images. Renal cell carcinoma cannot be excluded, though this may reflect underlying scarring and normal renal parenchyma. Nonspecific perinephric stranding is noted bilaterally. Scattered bilateral renal cysts are seen. No renal or ureteral stones are identified. There is no evidence of hydronephrosis. Stomach/Bowel: The patient is status post partial right-sided hemicolectomy. The ileocolic anastomosis is grossly unremarkable in appearance. Scattered diverticulosis is noted along the descending and sigmoid colon, without evidence of diverticulitis. The small bowel is unremarkable in appearance. The stomach is within normal limits. Vascular/Lymphatic: Scattered calcification is seen along the abdominal aorta and its branches. The abdominal aorta is otherwise grossly unremarkable. The inferior vena cava is grossly unremarkable. No retroperitoneal lymphadenopathy is seen. No  pelvic sidewall lymphadenopathy is identified. Reproductive: The bladder is mildly distended and grossly unremarkable. The uterus  is grossly unremarkable. No suspicious adnexal masses are seen. Other: No additional soft tissue abnormalities are seen. Musculoskeletal: No acute osseous abnormalities are identified. There is mild diffuse sclerosis involving visualized osseous structures, without evidence of osseous metastasis. Vacuum phenomenon and endplate sclerotic change are noted at L4-L5. The visualized musculature is unremarkable in appearance. IMPRESSION: 1. Multilobulated mildly spiculated mass at the right upper lung lobe, measuring 3.3 x 2.7 x 3.2 cm. Associated cavitation noted. This is concerning for primary bronchogenic malignancy. Pulmonary consultation is recommended for tissue diagnosis, as deemed clinically appropriate. 2. No definite evidence of metastatic disease. 3. Question of enhancing 1.6 cm nodule near the upper pole of the left kidney on sagittal images. Though this may simply reflect underlying scarring and normal renal parenchyma, renal cell carcinoma cannot be excluded. MRI of the abdomen with contrast could be considered for further evaluation. 4. Scattered aortic atherosclerosis. 5. Diffuse coronary artery calcifications noted. 6. Scattered diverticulosis along the descending and sigmoid colon, without evidence of diverticulitis. 7. Scattered bilateral renal cysts seen. Electronically Signed   By: Garald Balding M.D.   On: 02/16/2017 21:17   Mr Brain Wo Contrast  Result Date: 02/16/2017 CLINICAL DATA:  76 year old female with left side headache, left blurred vision. TIA symptoms. Spiculated right upper lung mass discovered on CTA yesterday. EXAM: MRI HEAD WITHOUT CONTRAST TECHNIQUE: Multiplanar, multiecho pulse sequences of the brain and surrounding structures were obtained without intravenous contrast. COMPARISON:  CTA head and neck 02/15/2017 and earlier, including brain MRI  07/24/2013. FINDINGS: Brain: Chronic encephalomalacia left middle frontal gyrus, with mildly progressed white matter gliosis since 2015. No restricted diffusion to suggest acute infarction. No midline shift, mass effect, ventriculomegaly, extra-axial collection or acute intracranial hemorrhage. Cervicomedullary junction and pituitary are within normal limits. No signal abnormality suspicious for vasogenic edema. Outside of the left middle frontal gyrus there are scattered small nonspecific T2 and FLAIR hyperintense foci. No other new signal abnormality. No other cortical encephalomalacia or chronic cerebral blood products. No contrast administered. Vascular: Major intracranial vascular flow voids are preserved and appear stable since 2015. Skull and upper cervical spine: Normal visible bone marrow signal. Negative visualized cervical spine. Sinuses/Orbits: Stable and negative orbits soft tissues. Chronic right maxillary sinus mucosal thickening, increased mucosal thickening or small superimposed fluid level today. Other paranasal sinuses and mastoids are stable and well pneumatized. Other: Visible internal auditory structures appear normal. Scalp and face soft tissues appear negative. IMPRESSION: 1. No acute intracranial abnormality. Note that early metastatic disease to the brain cannot be excluded in the absence of intravenous contrast. 2. Chronic left middle frontal gyrus encephalomalacia. 3. Acute on chronic right maxillary sinusitis. Electronically Signed   By: Genevie Ann M.D.   On: 02/16/2017 14:03   Ct Abdomen Pelvis W Contrast  Result Date: 02/16/2017 CLINICAL DATA:  Assess right-sided lung nodule. Staging for malignancy. Initial encounter. EXAM: CT CHEST, ABDOMEN, AND PELVIS WITH CONTRAST TECHNIQUE: Multidetector CT imaging of the chest, abdomen and pelvis was performed following the standard protocol during bolus administration of intravenous contrast. CONTRAST:  36m ISOVUE-300 IOPAMIDOL (ISOVUE-300)  INJECTION 61% COMPARISON:  None. FINDINGS: CT CHEST FINDINGS Cardiovascular: Diffuse coronary artery calcifications are seen. The heart remains normal in size. Scattered calcification is noted along the aortic arch. The great vessels are unremarkable in appearance. Mediastinum/Nodes: Visualized mediastinal nodes remain normal in size. No pericardial effusion is identified. The visualized portions of the thyroid gland are unremarkable. No axillary lymphadenopathy is seen. Lungs/Pleura: There is a multilobulated mildly spiculated mass  at the right upper lobe, measuring 3.3 x 2.7 x 3.2 cm. There appears to be some degree of associated cavitation. This is concerning for primary bronchogenic malignancy. No definite satellite nodules are identified. A tiny calcified granuloma is noted at the right lung apex. No pleural effusion or pneumothorax is identified. Musculoskeletal: No acute osseous abnormalities are identified. The visualized musculature is unremarkable in appearance. CT ABDOMEN PELVIS FINDINGS Hepatobiliary: The liver is unremarkable in appearance. The patient is status post cholecystectomy, with clips noted at the gallbladder fossa. The common bile duct remains normal in caliber. Pancreas: The pancreas is within normal limits. Spleen: A vague 1.3 cm hypodensity in the periphery of the spleen appears to be posttraumatic in nature, given its distribution. Adrenals/Urinary Tract: There is slight nodularity of the left adrenal gland, without a definite mass. The adrenal glands are otherwise grossly unremarkable in appearance. There appears to be an enhancing 1.6 cm nodule near the upper pole of the left kidney on sagittal images. Renal cell carcinoma cannot be excluded, though this may reflect underlying scarring and normal renal parenchyma. Nonspecific perinephric stranding is noted bilaterally. Scattered bilateral renal cysts are seen. No renal or ureteral stones are identified. There is no evidence of  hydronephrosis. Stomach/Bowel: The patient is status post partial right-sided hemicolectomy. The ileocolic anastomosis is grossly unremarkable in appearance. Scattered diverticulosis is noted along the descending and sigmoid colon, without evidence of diverticulitis. The small bowel is unremarkable in appearance. The stomach is within normal limits. Vascular/Lymphatic: Scattered calcification is seen along the abdominal aorta and its branches. The abdominal aorta is otherwise grossly unremarkable. The inferior vena cava is grossly unremarkable. No retroperitoneal lymphadenopathy is seen. No pelvic sidewall lymphadenopathy is identified. Reproductive: The bladder is mildly distended and grossly unremarkable. The uterus is grossly unremarkable. No suspicious adnexal masses are seen. Other: No additional soft tissue abnormalities are seen. Musculoskeletal: No acute osseous abnormalities are identified. There is mild diffuse sclerosis involving visualized osseous structures, without evidence of osseous metastasis. Vacuum phenomenon and endplate sclerotic change are noted at L4-L5. The visualized musculature is unremarkable in appearance. IMPRESSION: 1. Multilobulated mildly spiculated mass at the right upper lung lobe, measuring 3.3 x 2.7 x 3.2 cm. Associated cavitation noted. This is concerning for primary bronchogenic malignancy. Pulmonary consultation is recommended for tissue diagnosis, as deemed clinically appropriate. 2. No definite evidence of metastatic disease. 3. Question of enhancing 1.6 cm nodule near the upper pole of the left kidney on sagittal images. Though this may simply reflect underlying scarring and normal renal parenchyma, renal cell carcinoma cannot be excluded. MRI of the abdomen with contrast could be considered for further evaluation. 4. Scattered aortic atherosclerosis. 5. Diffuse coronary artery calcifications noted. 6. Scattered diverticulosis along the descending and sigmoid colon, without  evidence of diverticulitis. 7. Scattered bilateral renal cysts seen. Electronically Signed   By: Garald Balding M.D.   On: 02/16/2017 21:17    Subjective: No complaints  Discharge Exam: Vitals:   02/17/17 0937 02/17/17 1422  BP: (!) 128/50 (!) 120/55  Pulse: 69 67  Resp: 16 18  Temp: 98.2 F (36.8 C) 97.9 F (36.6 C)   Vitals:   02/17/17 0116 02/17/17 0559 02/17/17 0937 02/17/17 1422  BP: 94/71 (!) 86/65 (!) 128/50 (!) 120/55  Pulse:  78 69 67  Resp: '18 18 16 18  ' Temp: 98.1 F (36.7 C) 98.6 F (37 C) 98.2 F (36.8 C) 97.9 F (36.6 C)  TempSrc: Oral Oral Oral Oral  SpO2: 95% 95% 98%  98%  Weight:      Height:        General: Pt is alert, awake, not in acute distress Cardiovascular: RRR, S1/S2 +, no rubs, no gallops Respiratory: CTA bilaterally, no wheezing, no rhonchi Abdominal: Soft, NT, ND, bowel sounds + Extremities: no edema, no cyanosis   The results of significant diagnostics from this hospitalization (including imaging, microbiology, ancillary and laboratory) are listed below for reference.     Microbiology: No results found for this or any previous visit (from the past 240 hour(s)).   Labs: BNP (last 3 results) No results for input(s): BNP in the last 8760 hours. Basic Metabolic Panel:  Recent Labs Lab 02/15/17 1712 02/15/17 1715 02/17/17 0422  NA 139 140 137  K 3.9 3.6 3.8  CL 103 101 103  CO2 26  --  26  GLUCOSE 230* 223* 163*  BUN 19 21* 17  CREATININE 1.24* 1.20* 1.05*  CALCIUM 9.0  --  8.7*   Liver Function Tests:  Recent Labs Lab 02/15/17 1712  AST 14*  ALT 11*  ALKPHOS 67  BILITOT 0.4  PROT 6.7  ALBUMIN 3.6   No results for input(s): LIPASE, AMYLASE in the last 168 hours. No results for input(s): AMMONIA in the last 168 hours. CBC:  Recent Labs Lab 02/15/17 1712 02/15/17 1715  WBC 14.3*  --   NEUTROABS 10.4*  --   HGB 12.0 12.2  HCT 35.6* 36.0  MCV 90.1  --   PLT 477*  --    Cardiac Enzymes: No results for  input(s): CKTOTAL, CKMB, CKMBINDEX, TROPONINI in the last 168 hours. BNP: Invalid input(s): POCBNP CBG:  Recent Labs Lab 02/16/17 1801 02/16/17 2129 02/17/17 0002 02/17/17 0801 02/17/17 1101  GLUCAP 206* 116* 177* 223* 99   D-Dimer No results for input(s): DDIMER in the last 72 hours. Hgb A1c  Recent Labs  02/16/17 0347  HGBA1C 8.4*   Lipid Profile  Recent Labs  02/16/17 0347  CHOL 154  HDL 38*  LDLCALC 68  TRIG 241*  CHOLHDL 4.1   Thyroid function studies No results for input(s): TSH, T4TOTAL, T3FREE, THYROIDAB in the last 72 hours.  Invalid input(s): FREET3 Anemia work up No results for input(s): VITAMINB12, FOLATE, FERRITIN, TIBC, IRON, RETICCTPCT in the last 72 hours. Urinalysis    Component Value Date/Time   COLORURINE YELLOW 07/25/2013 [place] 07/25/2013 0952   LABSPEC 1.025 07/25/2013 0952   PHURINE 6.5 07/25/2013 0952   GLUCOSEU >1000 (A) 07/25/2013 0952   HGBUR NEGATIVE 07/25/2013 0952   BILIRUBINUR NEGATIVE 07/25/2013 0952   KETONESUR 15 (A) 07/25/2013 0952   PROTEINUR 30 (A) 07/25/2013 0952   UROBILINOGEN 0.2 07/25/2013 0952   NITRITE NEGATIVE 07/25/2013 0952   LEUKOCYTESUR NEGATIVE 07/25/2013 0952   Sepsis Labs Invalid input(s): PROCALCITONIN,  WBC,  LACTICIDVEN Microbiology No results found for this or any previous visit (from the past 240 hour(s)).   SIGNED:   Donne Hazel, MD  [facility] 02/17/2017, 5:28 PM  If 7PM-7AM, please contact night-coverage [email or web address] Password [email or web address]

## 2017-02-18 ENCOUNTER — Telehealth: Payer: Self-pay | Admitting: Family Medicine

## 2017-02-18 NOTE — Telephone Encounter (Signed)
Left message for patient to call back  

## 2017-02-18 NOTE — Progress Notes (Signed)
Late entry for missed G-code:    03/15/2017 0933  PT G-Codes **NOT FOR INPATIENT CLASS**  Functional Assessment Tool Used Dynamic Gait Index  Functional Limitation Mobility: Walking and moving around  Mobility: Walking and Moving Around Current Status (J6811) CI  Mobility: Walking and Moving Around Goal Status [phone]) CI  Mobility: Walking and Moving Around Discharge Status [phone]) CI  [name], [place]  [phone] 02/18/2017

## 2017-02-18 NOTE — Telephone Encounter (Signed)
Pt was called concerning recent hospital stay. Pt states she is feeling better. Current and discharge meds were reconciled. Pt states that the hospital d/c verapamil and started her on a 81 mg Asprin. All other meds stayed the same. Pt does have all meds filled and at home.  After hour and weekend protocol was discussed and pt verified understanding. Pt made an appt and was informed to bring all meds with her to that appt. Pt verifed understanding.

## 2017-02-26 ENCOUNTER — Other Ambulatory Visit: Payer: Self-pay | Admitting: Family Medicine

## 2017-02-26 DIAGNOSIS — E785 Hyperlipidemia, unspecified: Secondary | ICD-10-CM

## 2017-02-26 DIAGNOSIS — E1169 Type 2 diabetes mellitus with other specified complication: Secondary | ICD-10-CM

## 2017-03-04 ENCOUNTER — Encounter: Payer: Self-pay | Admitting: Family Medicine

## 2017-03-04 ENCOUNTER — Ambulatory Visit (INDEPENDENT_AMBULATORY_CARE_PROVIDER_SITE_OTHER): Payer: Medicare Other | Admitting: Family Medicine

## 2017-03-04 VITALS — BP 122/70 | HR 80 | Resp 16 | Wt 146.2 lb

## 2017-03-04 DIAGNOSIS — E1169 Type 2 diabetes mellitus with other specified complication: Secondary | ICD-10-CM

## 2017-03-04 DIAGNOSIS — Z8673 Personal history of transient ischemic attack (TIA), and cerebral infarction without residual deficits: Secondary | ICD-10-CM

## 2017-03-04 DIAGNOSIS — E785 Hyperlipidemia, unspecified: Secondary | ICD-10-CM | POA: Diagnosis not present

## 2017-03-04 DIAGNOSIS — G459 Transient cerebral ischemic attack, unspecified: Secondary | ICD-10-CM | POA: Diagnosis not present

## 2017-03-04 DIAGNOSIS — J984 Other disorders of lung: Secondary | ICD-10-CM

## 2017-03-04 DIAGNOSIS — F17210 Nicotine dependence, cigarettes, uncomplicated: Secondary | ICD-10-CM | POA: Diagnosis not present

## 2017-03-04 NOTE — Progress Notes (Signed)
   Subjective:    Patient ID: [name], female    DOB: 16-Nov-1940, 76 y.o.   MRN: [id number]  HPI She is here for a posthospitalization follow-up. The hospital record including discharge summary labs and x-rays was reviewed. She did have a TIA. She has an underlying history of CVA. She also has a cavitary lung lesion. While in the hospital her verapamil was held due to her low blood pressure. She does continue to smoke and expresses some desire to quit but then verbalize she was truly not ready. Presently she is having no headache blurred vision, double vision, weakness, numbness or tingling. No chest pain, shortness of breath. She continues on medications listed in the chart, however her verapamil was held. Her last hemoglobin A1c was elevated. She has had difficulty with getting good blood pressure readings.    Review of Systems     Objective:   Physical Exam Alert and in no distress. EOMI. DTRs normal. Normal speech. Tympanic membranes and canals are normal. Pharyngeal area is normal. Neck is supple without adenopathy or thyromegaly. Cardiac exam shows a regular sinus rhythm without murmurs or gallops. Lungs are clear to auscultation.        Assessment & Plan:  Transient cerebral ischemia, unspecified type  Cavitary lesion of lung - Plan: Ambulatory referral to Pulmonology  Light smoker  History of CVA (cerebrovascular accident)  DM type 2 with diabetic dyslipidemia ([place])  I discussed follow-up with her in regards to neurology and pulmonary. She was supposed to hear from pulmonary but has yet not heard from them. I will send a referral. She will hold the verapamil but keep on her other medications. Discussed smoking cessation with her and at this point I will not push the issue as she is not ready. This strongly encouraged her to follow-up with pulmonary to get the lung  lesion evaluated further. Approximately 25 minutes was spent discussing all these issues with her

## 2017-03-21 ENCOUNTER — Ambulatory Visit: Payer: Medicare Other | Admitting: Family Medicine

## 2017-04-15 ENCOUNTER — Ambulatory Visit (INDEPENDENT_AMBULATORY_CARE_PROVIDER_SITE_OTHER): Payer: Medicare Other | Admitting: Neurology

## 2017-04-15 ENCOUNTER — Encounter: Payer: Self-pay | Admitting: Neurology

## 2017-04-15 VITALS — BP 145/75 | HR 82 | Wt 144.8 lb

## 2017-04-15 DIAGNOSIS — G459 Transient cerebral ischemic attack, unspecified: Secondary | ICD-10-CM

## 2017-04-15 DIAGNOSIS — Z8673 Personal history of transient ischemic attack (TIA), and cerebral infarction without residual deficits: Secondary | ICD-10-CM

## 2017-04-15 DIAGNOSIS — R918 Other nonspecific abnormal finding of lung field: Secondary | ICD-10-CM

## 2017-04-15 DIAGNOSIS — G43109 Migraine with aura, not intractable, without status migrainosus: Secondary | ICD-10-CM | POA: Diagnosis not present

## 2017-04-15 DIAGNOSIS — F17200 Nicotine dependence, unspecified, uncomplicated: Secondary | ICD-10-CM | POA: Diagnosis not present

## 2017-04-15 NOTE — Progress Notes (Signed)
STROKE NEUROLOGY FOLLOW UP NOTE  NAME: [name] DOB: Aug 23, 1940  REASON FOR VISIT: stroke follow up HISTORY FROM: pt and chart  Today we had the pleasure of seeing [name] in follow-up at our Neurology Clinic. Pt was accompanied by daughter.   History Summary [name] is a 76 y.o. female who comes to the office for first hospital follow up post hospital discharge for stroke. She has a history of HTN, HLD, DM and TIA in May 2014, presenting 06/27/2013 with new onset speech difficulty. She was taking aspirin daily but ran out of aspirin one week prior. CT scan of her head today showed no intracranial abnormality. MRI study showed small left posterior frontal ischemic cerebral infarction. MRA was negative. LDL 66, HgbA1c 7.6. 2D Echo with EF 55% to 60%. No cardiac source of emboli was indentified. Carotid dopplers showed left side >80% stenosis, and she had left endarterectomy by Dr. Oneida Alar.   Follow up 09/09/13 (LL) - She was hospitalized with sepsis from C diff Jan 2015. She recovered well without serious neurological deficits. She has mild word-finding difficulties and dysarthria when tired. Yesterday she had episode of numbness in her left hand which spread to the left side of her face, which resolved within a half-hour. She was taken to ER by family, but discharged with diagnosis of TIA. Her aspirin was changed to Plavix which she has not filled yet.   Follow up 03/18/14 (LL): [name] comes to the office with her daughter for 6 month stroke followup. She states she is doing well and feels like she has fully recovered from stroke. Her blood pressure is well controlled, it is 129/72 in the office today. Her last Hgb a1c was 7.0, down from 7.8 previously. She has no recurrent symptoms of numbness, weakness or speech difficulties. She is tolerating Plavix well with no signs of significant bleeding or bruising. She has no new complaints today.  Follow up 09/20/14 - the  patient has been doing well. No recurrent stroke like symptoms. Her BP today 117/62. She said her BP and glucose are good at home. Her A1C in 06/2014 was 7.2, up from 7.0 in July. She continues to smoke, 6 cig per day. She is not ready to quit yet. She is on plavix and crestor. She is going to have CUS next Monday.   Follow up 04/07/15 - pt has been doing well. No recurrent symptoms. Follows with vascular surgery in 09/2014 and doing well. Next visit in 09/2015. She still not quit smoking yet and seems she is not willing to quit at this time. BP 110/70.   Interval History During the interval time, pt has been doing well until 02/14/17 when she had right eye blurry vision (seems looking through ocean) and right fingertip numbness for 2 min. Then on 02/15/17 she was in grocery store, she had left eye blurry vision and left hand numbness for 5 min. She hit the left side of the door on her way out, concerning for left hemianopia. She was sent to ER, admitted for stroke work up. MRI negative for stroke, CTA head and neck showed left ICA bifurcation 40% stenosis but right upper lobe cavitary mass concerning for malignancy. CT abd/pelvis no significant malignancy. LDL 68 and A1C 8.4. She was put on DAPT for 90 days and crestor continued. Also recommend follow up with pulmonary as outpt and quit smoking.   However, on detailed history questioning, she stated that she has hx of migraine  years ago with HA and can not remember frequency or the last time for HA. She has intermittent blurry vision since her 21s. The vision change this time is not new to her. Her daughter also has blurry vision as migraine aura.  She also followed with [facility] in 09/2016 and CUS showed b/l < 40% ICA. She is scheduled to see pulmonary in 2 days. Her glucose still not in control, recent A1C 8.4. She is on janumet bid and plan to increase next time with PCP visit. Did not check BP at home and today BP 145/75 and she is on lisinopril 20mg .   REVIEW OF  SYSTEMS: Full 14 system review of systems performed and notable only for those listed below and in HPI above, all others are negative:  Constitutional:   Cardiovascular:  Ear/Nose/Throat:   Skin:  Eyes:   Respiratory:   Gastroitestinal:   Genitourinary:  Hematology/Lymphatic:   Endocrine:  Musculoskeletal:   Allergy/Immunology:   Neurological:   Psychiatric:  Sleep:   The following represents the patient's updated allergies and side effects list: Allergies  Allergen Reactions  . Codeine Nausea And Vomiting  . Lipitor [Atorvastatin] Nausea And Vomiting  . Phenergan [Promethazine Hcl] Other (See Comments)    Confusion, hallucinations, severe agitation    The neurologically relevant items on the patient's problem list were reviewed on today's visit.  Neurologic Examination  A problem focused neurological exam (12 or more points of the single system neurologic examination, vital signs counts as 1 point, cranial nerves count for 8 points) was performed.  Blood pressure (!) 145/75, pulse 82, weight 144 lb 12.8 oz (65.7 kg).  General - Well nourished, well developed, in no apparent distress.  Ophthalmologic - Sharp disc margins OU.  Cardiovascular - Regular rate and rhythm with no murmur.  Mental Status -  Level of arousal and orientation to time, place, and person were intact. Language including expression, naming, repetition, comprehension was assessed and found intact.  Cranial Nerves II - XII - II - Visual field intact OU. III, IV, VI - Extraocular movements intact. V - Facial sensation intact bilaterally. VII - Facial movement intact bilaterally. VIII - Hearing & vestibular intact bilaterally. X - Palate elevates symmetrically. XI - Chin turning & shoulder shrug intact bilaterally. XII - Tongue protrusion intact.  Motor Strength - The patient's strength was normal in all extremities and pronator drift was absent.  Bulk was normal and fasciculations were absent.     Motor Tone - Muscle tone was assessed at the neck and appendages and was normal.  Reflexes - The patient's reflexes were normal in all extremities and she had no pathological reflexes.  Sensory - Light touch, temperature/pinprick were assessed and were normal.    Coordination - The patient had normal movements in the hands and feet with no ataxia or dysmetria.  Tremor was absent.  Gait and Station - The patient's transfers, posture, gait, station, and turns were observed as normal.  Data reviewed: I personally reviewed the images and agree with the radiology interpretations.  06/27/13 - MRI and MRA -  Acute infarct left posterior frontal lobe involving cortex and white matter. This measures approximately 10 x 20 mm. Negative intracranial MRA.  07/24/13 MRI No new infarction when compared to the study of December. Some persistent residual restricted diffusion in the area of infarction in the left posterior frontal region which was acute in December. Mild chronic small-vessel changes elsewhere affecting the cerebral hemispheric white matter.  CUS 02/04/14 -  right ICA < 40% and left ICA patent post op  CUS 06/29/13 - The vertebral arteries appear patent with antegrade flow. - Findings consistent with1- 39 percent stenosis involving the right internal carotid artery. - Findings consistent with greater than 80 percent stenosis involving the left internal carotid artery.  2D echo 07/27/13 - Left ventricle: The cavity size was normal. Wall thickness was increased in a pattern of mild LVH. Systolic function was normal. The estimated ejection fraction was in the range of 50% to 55%. Wall motion was normal; there were no regional wall motion abnormalities. Left ventricular diastolic function parameters were normal.  Ct Angio Head W Or Wo Contrast Ct Angio Neck W Or Wo Contrast  02/15/2017 1. Patent carotid and vertebral arteries. No dissection, aneurysm, or hemodynamically  significant stenosis utilizing NASCET criteria.  2. Patent circle of Willis. No large vessel occlusion, aneurysm, or significant stenosis.  3. Moderate mixed plaque of the left carotid bifurcation with 40% mild proximal ICA stenosis and plaque ulceration.  4. Right upper lobe spiculated and cavitary mass measuring up to 3.6 cm likely representing primary lung cancer. CT of the chest for more complete evaluation is recommended as well as surgical consultation. PETCT can be obtained for staging after tissue sampling.   Ct Head Wo Contrast 02/15/2017 1. No acute intracranial abnormality identified.  2. Stable small chronic left superolateral frontal cortical infarction and right putamen chronic lacunar infarction.  3. Mild chronic microvascular ischemic changes and mild parenchymal volume loss of the brain.   Mr Brain Wo Contrast 02/16/2017 1. No acute intracranial abnormality. Note that early metastatic disease to the brain cannot be excluded in the absence of intravenous contrast.  2. Chronic left middle frontal gyrus encephalomalacia.  3. Acute on chronic right maxillary sinusitis.   CT Abdomen and Pelvis w Contrast 02/16/2017 Multilobulated mildly spiculated mass at the right upper lung lobe, measuring 3.3 x 2.7 x 3.2 cm. Associated cavitation noted. This is concerning for primary bronchogenic malignancy. Pulmonary consultation is recommended for tissue diagnosis, as deemed clinically appropriate. 2. No definite evidence of metastatic disease. 3. Question of enhancing 1.6 cm nodule near the upper pole of the left kidney on sagittal images. Though this may simply reflect underlying scarring and normal renal parenchyma, renal cell carcinoma cannot be excluded. MRI of the abdomen with contrast could be considered for further evaluation. 4. Scattered aortic atherosclerosis. 5. Diffuse coronary artery calcifications noted. 6. Scattered diverticulosis along the descending and sigmoid  colon, without evidence of diverticulitis. 7. Scattered bilateral renal cysts seen.   Component     Latest Ref Rng 11/17/2012 06/28/2013 09/29/2013 02/10/2014  Cholesterol     0 - 200 mg/dL 196 156    Triglycerides     <150 mg/dL 185 (H) 208 (H)    HDL     >39 mg/dL 49 48    Total CHOL/HDL Ratio      4.0 3.3    VLDL     0 - 40 mg/dL 37 42 (H)    LDL (calc)     0 - 99 mg/dL 110 (H) 66    Hemoglobin A1C        7.8 7.0   Component     Latest Ref Rng 06/29/2014  Cholesterol     0 - 200 mg/dL   Triglycerides     <150 mg/dL   HDL     >39 mg/dL   Total CHOL/HDL Ratio        VLDL  0 - 40 mg/dL   LDL (calc)     0 - 99 mg/dL   Hemoglobin A1C      7.2   Component     Latest Ref Rng & Units 02/16/2017  Cholesterol     0 - 200 mg/dL 154  Triglycerides     <150 mg/dL 241 (H)  HDL Cholesterol     >40 mg/dL 38 (L)  Total CHOL/HDL Ratio     RATIO 4.1  VLDL     0 - 40 mg/dL 48 (H)  LDL (calc)     0 - 99 mg/dL 68  Hemoglobin A1C     4.8 - 5.6 % 8.4 (H)  Mean Plasma Glucose     mg/dL 194    Assessment: As you may recall, she is a 76 y.o. Caucasian female with PMH of HTN, HLD, DM and TIA in May 2014, admitted on12/13/2014 for small left posterior frontal ischemic cerebral infarction showing on MRI. HgbA1c 7.6. Carotid dopplers showed left side >80% stenosis, and she had left endarterectomy by Dr. Oneida Alar.follow up CUS so far patent left ICA with no stenosis of right ICA. She had one TIA episode in 07/2013 with left hand face and arm weakness. MRI negative. Her ASA changed to plavix. Follow up with vascular surgery in 09/2016 and CUS showed b/l ICA < 40%.  On 02/14/17 she had right eye blurry vision (seems looking through ocean) and right fingertip numbness for 2 min. Then on 02/15/17 she had left eye blurry vision and left hand numbness for 5 min. MRI negative for stroke, CTA head and neck showed left ICA bifurcation 40% stenosis but right upper lobe cavitary mass concerning for  malignancy. CT abd/pelvis no significant malignancy. LDL 68 and A1C 8.4. She was put on DAPT for 90 days and crestor continued. Also recommend follow up with pulmonary as outpt and quit smoking.   However, she stated that she has hx of migraine years ago with HA and intermittent blurry vision since her 75s. The vision change this time is not new to her. Her daughter also has blurry vision as migraine aura. Her episodes in 02/2017 were likely migraine visual auras.   Plan:  - continue ASA and plavix for one more month, and then continue plavix for stroke prevention - continue crestor for stroke prevention - check BP and glucose at home and record, and bring over to PCP for medication adjustment - Follow up with your primary care physician for stroke risk factor modification. Recommend maintain blood pressure goal <130/80, diabetes with hemoglobin A1c goal below 6.5% and lipids with LDL cholesterol goal below 70 mg/dL.  - quit smoking - follow up with pulmonary this Wednesday as scheduled - follow up with vascular surgery for carotid doppler monitoring - follow up in 3 months.   I spent more than 25 minutes of face to face time with the patient. Greater than 50% of time was spent in counseling and coordination of care. We discussed etiology of blurry vision, follow up with pulmonary, quit smoking and DM control / monitoring.    No orders of the defined types were placed in this encounter.   Meds ordered this encounter  Medications  . LISINOPRIL PO    Sig: Take 20 mg by mouth.    Patient Instructions  - continue ASA and plavix for one more month, and then continue plavix for stroke prevention - continue crestor for stroke prevention - check BP and glucose at home and record, and  bring over to PCP for medication adjustment - Follow up with your primary care physician for stroke risk factor modification. Recommend maintain blood pressure goal <130/80, diabetes with hemoglobin A1c goal below  6.5% and lipids with LDL cholesterol goal below 70 mg/dL.  - quit smoking - follow up with lung doctor this Wednesday as scheduled - follow up with vascular surgery for carotid doppler monitoring - your episodes are likely migraine auras, but you need to be careful during the episode and make sure yourself safe. If the episode prolonged, you need to come to ER or call 911. - follow up in 3 months.    Rosalin Hawking, MD PhD [facility] [address], [place], [place] [address] [phone]

## 2017-04-15 NOTE — Patient Instructions (Signed)
-   continue ASA and plavix for one more month, and then continue plavix for stroke prevention - continue crestor for stroke prevention - check BP and glucose at home and record, and bring over to PCP for medication adjustment - Follow up with your primary care physician for stroke risk factor modification. Recommend maintain blood pressure goal <130/80, diabetes with hemoglobin A1c goal below 6.5% and lipids with LDL cholesterol goal below 70 mg/dL.  - quit smoking - follow up with lung doctor this Wednesday as scheduled - follow up with vascular surgery for carotid doppler monitoring - your episodes are likely migraine auras, but you need to be careful during the episode and make sure yourself safe. If the episode prolonged, you need to come to ER or call 911. - follow up in 3 months.

## 2017-04-17 ENCOUNTER — Ambulatory Visit (INDEPENDENT_AMBULATORY_CARE_PROVIDER_SITE_OTHER): Payer: Medicare Other | Admitting: Pulmonary Disease

## 2017-04-17 ENCOUNTER — Encounter: Payer: Self-pay | Admitting: Pulmonary Disease

## 2017-04-17 DIAGNOSIS — R911 Solitary pulmonary nodule: Secondary | ICD-10-CM | POA: Diagnosis not present

## 2017-04-17 NOTE — Progress Notes (Signed)
[name]    [id number]    08/22/1940  Primary Care Physician:Lalonde, Elyse Jarvis, MD  Referring Physician: [name], [address] [place], [place] [address]  Chief complaint:  Consult for evaluation of right upper lobe mass  HPI: 76 year old with history of hypertension, diabetes, hyperlipidemia, stroke. She is here for follow-up of a right upper lobe mass which was discovered incidentally by being worked up for CVA. She was admitted in August 2018 for blurry vision. She had a CTA, MRI which did not show any acute abnormality. During this workup she had lung imaging which shows 3-4 cm right upper lobe spiculated lung nodule with cavitation.  She is a heavy smoker and continues to smoke half pack per day. She denies any dyspnea, cough, sputum production, hemoptysis, loss of weight, loss of appetite.  Outpatient Encounter Prescriptions as of 04/17/2017  Medication Sig  . ACCU-CHEK AVIVA PLUS test strip CHECK BLOOD SUGAR TWO TIMES DAILY  . ACCU-CHEK SOFTCLIX LANCETS lancets Patient is to test two times a day DX E11.9  . aspirin EC 81 MG EC tablet Take 1 tablet (81 mg total) by mouth daily.  . Blood Glucose Monitoring Suppl (ACCU-CHEK AVIVA PLUS) w/Device KIT 1 kit by Does not apply route 2 (two) times daily.  . clopidogrel (PLAVIX) 75 MG tablet TAKE 1 TABLET BY MOUTH  DAILY (Patient taking differently: Take 75 mg by mouth once a day)  . JANUMET 50-1000 MG tablet take 1 tablet twice a day with food (Patient taking differently: Take 1 tablet by mouth two times a day)  . LISINOPRIL PO Take 20 mg by mouth.  . nicotine (NICODERM CQ - DOSED IN MG/24 HOURS) 14 mg/24hr patch Place 1 patch (14 mg total) onto the skin daily.  . rosuvastatin (CRESTOR) 20 MG tablet take 1 tablet by mouth once daily   No facility-administered encounter medications on file as of 04/17/2017.     Allergies as of 04/17/2017 - Review Complete 04/15/2017  Allergen Reaction Noted  . Codeine  Nausea And Vomiting 01/04/2011  . Lipitor [atorvastatin] Nausea And Vomiting 07/03/2013  . Phenergan [promethazine hcl] Other (See Comments) 07/27/2013    Past Medical History:  Diagnosis Date  . Carotid artery occlusion   . Clostridium difficile infection 06/2013  . Diabetes mellitus    takes Janumet daily  . Dyslipidemia    takes Crestor daily  . Gallstones   . GERD (gastroesophageal reflux disease)   . Heart murmur    hx of  . Hypertension    takes Prinzide and Verapamil daily  . Impaired speech    from stroke  . Neuropathy, diabetic ([place])   . Pneumonia   . PONV (postoperative nausea and vomiting)   . Smoker   . Stroke ([place]) 06/25/13  . Vertigo    but doesn't take any meds    Past Surgical History:  Procedure Laterality Date  . cataract removed Right   . CHOLECYSTECTOMY    . ENDARTERECTOMY Left 07/14/2013   Procedure: ENDARTERECTOMY CAROTID-LEFT;  Surgeon: Elam Dutch, MD;  Location: [facility];  Service: Vascular;  Laterality: Left;  . KNEE SURGERY Left 44yr ago  . LAPAROSCOPIC PARTIAL COLECTOMY N/A 09/02/2015   Procedure: LAPAROSCOPIC PARTIAL RIGHT COLECTOMY;  Surgeon: ALeighton Ruff MD;  Location: [facility];  Service: General;  Laterality: N/A;  . PATCH ANGIOPLASTY Left 07/14/2013   Procedure: LEFT CAROTID ARTERY PATCH ANGIOPLASTY;  Surgeon: CElam Dutch MD;  [facility];  Service: Vascular;  Laterality: Left;    Family History  Problem Relation Age of Onset  . Leukemia Mother   . Diabetes Mother   . Cancer Father        esophageal ca  . Heart disease Father   . Diabetes Maternal Aunt   . Diabetes Maternal Uncle   . Diabetes Brother   . Diabetes Sister   . Heart attack Brother        x 2    Social History   Social History  . Marital status: Married    Spouse name: Jeneen Rinks  . Number of children: 3  . Years of education: 10   Occupational History  . retired    Social History Main Topics  . Smoking status: Current Some Day Smoker     Packs/day: 0.20    Types: Cigarettes  . Smokeless tobacco: Never Used     Comment: 12 per day  . Alcohol use No  . Drug use: No  . Sexual activity: Yes   Other Topics Concern  . Not on file   Social History Narrative   Patient is married with 3 children.   Patient is right handed.   Patient has 10 th grade education.   Patient drinks 4 cups daily.    Review of systems: Review of Systems  Constitutional: Negative for fever and chills.  HENT: Negative.   Eyes: Negative for blurred vision.  Respiratory: as per HPI  Cardiovascular: Negative for chest pain and palpitations.  Gastrointestinal: Negative for vomiting, diarrhea, blood per rectum. Genitourinary: Negative for dysuria, urgency, frequency and hematuria.  Musculoskeletal: Negative for myalgias, back pain and joint pain.  Skin: Negative for itching and rash.  Neurological: Negative for dizziness, tremors, focal weakness, seizures and loss of consciousness.  Endo/Heme/Allergies: Negative for environmental allergies.  Psychiatric/Behavioral: Negative for depression, suicidal ideas and hallucinations.  All other systems reviewed and are negative.  Physical Exam: There were no vitals taken for this visit. Gen:      No acute distress HEENT:  EOMI, sclera anicteric Neck:     No masses; no thyromegaly Lungs:    Clear to auscultation bilaterally; normal respiratory effort CV:         Regular rate and rhythm; no murmurs Abd:      + bowel sounds; soft, non-tender; no palpable masses, no distension Ext:    No edema; adequate peripheral perfusion Skin:      Warm and dry; no rash Neuro: alert and oriented x 3 Psych: normal mood and affect  Data Reviewed: CT head 02/15/17-no acute changes in the head. Right upper lobe spiculated cavitary mass. CT chest and abdomen-right upper lobe mass with cavitation. No mediastinal lymphadenopathy or evidence of metastatic disease in chest or abdomen. I have reviewed all images  personally  Assessment:  Evaluation for right upper lobe lung mass On review of the CT scan and this looks highly suspicious for bronchogenic malignancy. There is no signs of metastatic disease so far. We will evaluate with PFTs and PET scan. If the activity is localized to the right upper lobe mass and PFTs look reasonable then she'll need referral to cardiothoracic surgery for evaluation of resection  Otherwise consider a navigational bronchoscopy or biopsy of any other accessible lesion that may be apparent on PET scan Discussed this with the patient her husband and daughter. The patient is not sure yet if she would want to undergo surgery or biopsy. I'll see her in 1-2 weeks after  these results to plan for further steps.  More then 1/2 the time of the 40 min visit was spent in counseling and/or coordination of care with the patient and family.  Plan/Recommendations: - PET scan and PFTs Follow-up in 2 weeks.   Marshell Garfinkel MD [facility] Pager [phone] 04/17/2017, 1:53 PM  CC: [name], MD

## 2017-04-17 NOTE — Patient Instructions (Addendum)
We will schedule you for a PET scan and pulmonary function test for further evaluation of the mass in the lung and your breathing capacity Follow-up in 1-2 weeks to discuss results and plan for further steps.

## 2017-04-26 ENCOUNTER — Ambulatory Visit (HOSPITAL_COMMUNITY)
Admission: RE | Admit: 2017-04-26 | Discharge: 2017-04-26 | Disposition: A | Discharge status: Home / Self Care | Payer: Medicare Other | Source: Ambulatory Visit | Attending: Pulmonary Disease | Admitting: Pulmonary Disease

## 2017-04-26 DIAGNOSIS — I700 Atherosclerosis of aorta: Secondary | ICD-10-CM | POA: Insufficient documentation

## 2017-04-26 DIAGNOSIS — I2510 Atherosclerotic heart disease of native coronary artery without angina pectoris: Secondary | ICD-10-CM | POA: Insufficient documentation

## 2017-04-26 DIAGNOSIS — R911 Solitary pulmonary nodule: Secondary | ICD-10-CM | POA: Insufficient documentation

## 2017-04-26 DIAGNOSIS — C3490 Malignant neoplasm of unspecified part of unspecified bronchus or lung: Secondary | ICD-10-CM | POA: Diagnosis not present

## 2017-04-26 LAB — GLUCOSE, CAPILLARY: GLUCOSE-CAPILLARY: 245 mg/dL — AB (ref 65–99)

## 2017-04-26 MED ORDER — FLUDEOXYGLUCOSE F - 18 (FDG) INJECTION
7.0000 | Freq: Once | INTRAVENOUS | Status: AC | PRN
  Administered 2017-04-26: 7 via INTRAVENOUS

## 2017-04-30 ENCOUNTER — Other Ambulatory Visit: Payer: Self-pay | Admitting: Pulmonary Disease

## 2017-04-30 ENCOUNTER — Other Ambulatory Visit: Payer: Self-pay | Admitting: Family Medicine

## 2017-04-30 DIAGNOSIS — F17200 Nicotine dependence, unspecified, uncomplicated: Secondary | ICD-10-CM

## 2017-04-30 DIAGNOSIS — R918 Other nonspecific abnormal finding of lung field: Secondary | ICD-10-CM

## 2017-05-01 ENCOUNTER — Ambulatory Visit (INDEPENDENT_AMBULATORY_CARE_PROVIDER_SITE_OTHER): Payer: Medicare Other | Admitting: Pulmonary Disease

## 2017-05-01 ENCOUNTER — Encounter: Payer: Self-pay | Admitting: Pulmonary Disease

## 2017-05-01 VITALS — BP 132/70 | HR 83 | Ht 65.5 in | Wt 144.0 lb

## 2017-05-01 DIAGNOSIS — F17200 Nicotine dependence, unspecified, uncomplicated: Secondary | ICD-10-CM

## 2017-05-01 DIAGNOSIS — R918 Other nonspecific abnormal finding of lung field: Secondary | ICD-10-CM | POA: Diagnosis not present

## 2017-05-01 DIAGNOSIS — F17210 Nicotine dependence, cigarettes, uncomplicated: Secondary | ICD-10-CM | POA: Diagnosis not present

## 2017-05-01 DIAGNOSIS — Z23 Encounter for immunization: Secondary | ICD-10-CM | POA: Diagnosis not present

## 2017-05-01 LAB — PULMONARY FUNCTION TEST
DL/VA % PRED: 96 %
DL/VA: 4.79 ml/min/mmHg/L
DLCO COR % PRED: 77 %
DLCO cor: 20.25 ml/min/mmHg
DLCO unc % pred: 68 %
DLCO unc: 18.02 ml/min/mmHg
FEF 25-75 POST: 1.58 L/s
FEF 25-75 Pre: 1.31 L/sec
FEF2575-%Change-Post: 20 %
FEF2575-%Pred-Post: 93 %
FEF2575-%Pred-Pre: 78 %
FEV1-%CHANGE-POST: 6 %
FEV1-%Pred-Post: 82 %
FEV1-%Pred-Pre: 77 %
FEV1-Post: 1.82 L
FEV1-Pre: 1.71 L
FEV1FVC-%Change-Post: 0 %
FEV1FVC-%PRED-PRE: 98 %
FEV6-%CHANGE-POST: 4 %
FEV6-%PRED-POST: 86 %
FEV6-%Pred-Pre: 82 %
FEV6-POST: 2.43 L
FEV6-Pre: 2.32 L
FEV6FVC-%CHANGE-POST: 0 %
FEV6FVC-%PRED-POST: 104 %
FEV6FVC-%Pred-Pre: 105 %
FVC-%CHANGE-POST: 5 %
FVC-%PRED-POST: 83 %
FVC-%Pred-Pre: 78 %
FVC-Post: 2.44 L
FVC-Pre: 2.32 L
PRE FEV6/FVC RATIO: 100 %
Post FEV1/FVC ratio: 75 %
Post FEV6/FVC ratio: 99 %
Pre FEV1/FVC ratio: 74 %
RV % pred: 113 %
RV: 2.72 L
TLC % PRED: 95 %
TLC: 5.03 L

## 2017-05-01 NOTE — Progress Notes (Signed)
PFT completed today 05/01/17.

## 2017-05-01 NOTE — Progress Notes (Signed)
[name]    [id number]    05/18/1941  Primary Care Physician:Lalonde, Elyse Jarvis, MD  Referring Physician: [name], MD [address] [place], [place] [address]  Chief complaint:  Follow-up for right upper lobe mass  HPI: 76 year old with history of hypertension, diabetes, hyperlipidemia, stroke. She is here for follow-up of a right upper lobe mass which was discovered incidentally by being worked up for CVA. She was admitted in August 2018 for blurry vision. She had a CTA, MRI which did not show any acute abnormality. During this workup she had lung imaging which shows 3-4 cm right upper lobe spiculated lung nodule with cavitation.  She is a heavy smoker and continues to smoke half pack per day. She denies any dyspnea, cough, sputum production, hemoptysis, loss of weight, loss of appetite.  Interim history: [name] has had a PET scan and PFTs. She is here for discussion of the results and plan. She continues to smoke. Does not have any new respiratory complaints.  Outpatient Encounter Prescriptions as of 05/01/2017  Medication Sig  . ACCU-CHEK AVIVA PLUS test strip CHECK BLOOD SUGAR TWO TIMES DAILY  . ACCU-CHEK SOFTCLIX LANCETS lancets Patient is to test two times a day DX E11.9  . aspirin EC 81 MG EC tablet Take 1 tablet (81 mg total) by mouth daily.  . Blood Glucose Monitoring Suppl (ACCU-CHEK AVIVA PLUS) w/Device KIT 1 kit by Does not apply route 2 (two) times daily.  . clopidogrel (PLAVIX) 75 MG tablet TAKE 1 TABLET BY MOUTH  DAILY (Patient taking differently: Take 75 mg by mouth once a day)  . JANUMET 50-1000 MG tablet take 1 tablet by mouth twice a day with food  . LISINOPRIL PO Take 20 mg by mouth.  . rosuvastatin (CRESTOR) 20 MG tablet take 1 tablet by mouth once daily   No facility-administered encounter medications on file as of 05/01/2017.     Allergies as of 05/01/2017 - Review Complete 05/01/2017  Allergen Reaction Noted  . Codeine  Nausea And Vomiting 01/04/2011  . Lipitor [atorvastatin] Nausea And Vomiting 07/03/2013  . Phenergan [promethazine hcl] Other (See Comments) 07/27/2013    Past Medical History:  Diagnosis Date  . Carotid artery occlusion   . Clostridium difficile infection 06/2013  . Diabetes mellitus    takes Janumet daily  . Dyslipidemia    takes Crestor daily  . Gallstones   . GERD (gastroesophageal reflux disease)   . Heart murmur    hx of  . Hypertension    takes Prinzide and Verapamil daily  . Impaired speech    from stroke  . Neuropathy, diabetic ([place])   . Pneumonia   . PONV (postoperative nausea and vomiting)   . Smoker   . Stroke ([place]) 06/25/13  . Vertigo    but doesn't take any meds    Past Surgical History:  Procedure Laterality Date  . cataract removed Right   . CHOLECYSTECTOMY    . ENDARTERECTOMY Left 07/14/2013   Procedure: ENDARTERECTOMY CAROTID-LEFT;  Surgeon: Elam Dutch, MD;  Location: [facility];  Service: Vascular;  Laterality: Left;  . KNEE SURGERY Left 45yr ago  . LAPAROSCOPIC PARTIAL COLECTOMY N/A 09/02/2015   Procedure: LAPAROSCOPIC PARTIAL RIGHT COLECTOMY;  Surgeon: ALeighton Ruff MD;  [facility];  Service: General;  Laterality: N/A;  . PATCH ANGIOPLASTY Left 07/14/2013   Procedure: LEFT CAROTID ARTERY PATCH ANGIOPLASTY;  Surgeon: CElam Dutch MD;  [facility]  Service: Vascular;  Laterality: Left;    Family History  Problem Relation Age of Onset  . Leukemia Mother   . Diabetes Mother   . Cancer Father        esophageal ca  . Heart disease Father   . Diabetes Maternal Aunt   . Diabetes Maternal Uncle   . Diabetes Brother   . Diabetes Sister   . Heart attack Brother        x 2    Social History   Social History  . Marital status: Married    Spouse name: Jeneen Rinks  . Number of children: 3  . Years of education: 10   Occupational History  . retired    Social History Main Topics  . Smoking status: Current Some Day Smoker     Packs/day: 0.20    Types: Cigarettes  . Smokeless tobacco: Never Used     Comment: 10-15 cigarettes  per day as of 05/01/17 ep  . Alcohol use No  . Drug use: No  . Sexual activity: Yes   Other Topics Concern  . Not on file   Social History Narrative   Patient is married with 3 children.   Patient is right handed.   Patient has 10 th grade education.   Patient drinks 4 cups daily.    Review of systems: Review of Systems  Constitutional: Negative for fever and chills.  HENT: Negative.   Eyes: Negative for blurred vision.  Respiratory: as per HPI  Cardiovascular: Negative for chest pain and palpitations.  Gastrointestinal: Negative for vomiting, diarrhea, blood per rectum. Genitourinary: Negative for dysuria, urgency, frequency and hematuria.  Musculoskeletal: Negative for myalgias, back pain and joint pain.  Skin: Negative for itching and rash.  Neurological: Negative for dizziness, tremors, focal weakness, seizures and loss of consciousness.  Endo/Heme/Allergies: Negative for environmental allergies.  Psychiatric/Behavioral: Negative for depression, suicidal ideas and hallucinations.  All other systems reviewed and are negative.  Physical Exam: Blood pressure 132/70, pulse 83, height 5' 5.5" (1.664 m), weight 144 lb (65.3 kg), SpO2 99 %. Gen:      No acute distress HEENT:  EOMI, sclera anicteric Neck:     No masses; no thyromegaly Lungs:    Clear to auscultation bilaterally; normal respiratory effort CV:         Regular rate and rhythm; no murmurs Abd:      + bowel sounds; soft, non-tender; no palpable masses, no distension Ext:    No edema; adequate peripheral perfusion Skin:      Warm and dry; no rash Neuro: alert and oriented x 3 Psych: normal mood and affect  Data Reviewed: CT head 02/15/17-no acute changes in the head. Right upper lobe spiculated cavitary mass. CT chest and abdomen 02/16/17-right upper lobe mass with cavitation. No mediastinal lymphadenopathy or  evidence of metastatic disease in chest or abdomen. PET scan 04/26/17-intense uptake in the right upper lobe mass suspicious for malignancy. No evidence of metastatic spread. Coronary artery calcification I have reviewed all images personally  PFTs 05/01/17 FVC 2.44 (83%], FEV1 1.82 (82%], F/F 75, TLC 95%, DLCO 68%, DLCO/VA 96% Minimal obstruction and reduction in diffusion capacity.  Assessment:  Evaluation for right upper lobe lung mass CT, PET scan reviewed. The right upper lobe cavitary mass shows increase in size with intense uptake highly suspicious for malignancy. There is no evidence of metastatic spread. PFTs showed only minimal obstruction. She may be a candidate for curative resection. I'll refer her to cardiothoracic surgery for  further evaluation. Discussed this with the patient her husband and daughter. The patient is not sure yet if she would want to undergo surgery but has agreed to meet with the surgeon.  Active smoker Smoking cessation encouraged. Time spent counseling-5 minutes  More then 1/2 the time of the 40 min visit was spent in counseling and/or coordination of care with the patient and family.  Plan/Recommendations: - Referral to cardiothoracic surgery for evaluation   Marshell Garfinkel MD [facility] Pager [phone] 05/01/2017, 11:24 AM  CC: [name], MD

## 2017-05-01 NOTE — Patient Instructions (Signed)
Will refer you to cardiothoracic surgery for further evaluation of the lung mass. Please continue to work on smoking cessation I'll see back in 1-2 months.

## 2017-05-08 ENCOUNTER — Encounter: Payer: Self-pay | Admitting: Thoracic Surgery (Cardiothoracic Vascular Surgery)

## 2017-05-08 ENCOUNTER — Institutional Professional Consult (permissible substitution) (INDEPENDENT_AMBULATORY_CARE_PROVIDER_SITE_OTHER): Payer: Medicare Other | Admitting: Thoracic Surgery (Cardiothoracic Vascular Surgery)

## 2017-05-08 VITALS — BP 115/69 | HR 89 | Ht 65.5 in | Wt 144.0 lb

## 2017-05-08 DIAGNOSIS — J984 Other disorders of lung: Secondary | ICD-10-CM | POA: Diagnosis not present

## 2017-05-08 NOTE — Progress Notes (Signed)
PCP is [name], MD Referring Provider is Marshell Garfinkel, MD  Chief Complaint  Patient presents with  . New Patient (Initial Visit)    Lung mass, Pet 04/26/2017    HPI: 76 yo woman sent for consultation re: right upper lobe mass  [name] is a 76 year old woman with a history of tobacco use (60-pack-years), hypertension, hyperlipidemia, atherosclerotic cardiovascular disease, stroke, TIA, migraines, type 2 diabetes complicated by neuropathy, carotid endarterectomy, Clostridium difficile infection, pneumonia, and vertigo.  She recently was hospitalized with blurred vision and numbness.  She was admitted for a stroke workup.  MRI was negative for stroke.  CT of the head and neck revealed a right upper lobe mass.  A CT of the chest abdomen and pelvis showed a spiculated right upper lobe mass measuring 3.3 x 2.7 x 3.2 cm.  There was no adenopathy.  There was a questionable nodule near the upper lobe of the left kidney.  That has not been worked up yet.  She had a PET/CT done as an outpatient masses increased in size to 4.1 cm and was more cavitary.  It had an SUV of 10.9.  There was no evidence of regional or distant metastases.  She was noted to have severe atherosclerotic changes with 3 vessel coronary calcifications.  She says that she is active.  She thinks she could walk a mile on level ground.  She denies any chest pain, pressure, or tightness either at rest or with exertion.  She denies shortness of breath unless she is doing very heavy exertion.  She does bruise easily.  She denies any change in appetite.  She is lost 2 pounds in the last 3 months.  She denies cough, wheezing, and hemoptysis. Zubrod Score: At the time of surgery this patient's most appropriate activity status/level should be described as: _0     0    Normal activity, no symptoms _1     1    Restricted in physical strenuous activity but ambulatory, able to do out light work _2     2    Ambulatory and capable of self  care, unable to do work activities, up and about >50 % of waking hours                              _3     3    Only limited self care, in bed greater than 50% of waking hours _4     4    Completely disabled, no self care, confined to bed or chair _5     5    Moribund  Past Medical History:  Diagnosis Date  . Carotid artery occlusion   . Clostridium difficile infection 06/2013  . Diabetes mellitus    takes Janumet daily  . Dyslipidemia    takes Crestor daily  . Gallstones   . GERD (gastroesophageal reflux disease)   . Heart murmur    hx of  . Hypertension    takes Prinzide and Verapamil daily  . Impaired speech    from stroke  . Neuropathy, diabetic ([place])   . Pneumonia   . PONV (postoperative nausea and vomiting)   . Smoker   . Stroke ([facility]) 06/25/13  . Vertigo    but doesn't take any meds    Past Surgical History:  Procedure Laterality Date  . cataract removed Right   . CHOLECYSTECTOMY    . ENDARTERECTOMY Left 07/14/2013   Procedure: ENDARTERECTOMY CAROTID-LEFT;  Surgeon:  Elam Dutch, MD;  Location: [facility];  Service: Vascular;  Laterality: Left;  . KNEE SURGERY Left 38yr ago  . LAPAROSCOPIC PARTIAL COLECTOMY N/A 09/02/2015   Procedure: LAPAROSCOPIC PARTIAL RIGHT COLECTOMY;  Surgeon: ALeighton Ruff MD;  Location: [facility];  Service: General;  Laterality: N/A;  . PATCH ANGIOPLASTY Left 07/14/2013   Procedure: LEFT CAROTID ARTERY PATCH ANGIOPLASTY;  Surgeon: CElam Dutch MD;  [facility];  Service: Vascular;  Laterality: Left;    Family History  Problem Relation Age of Onset  . Leukemia Mother   . Diabetes Mother   . Cancer Father        esophageal ca  . Heart disease Father   . Diabetes Maternal Aunt   . Diabetes Maternal Uncle   . Diabetes Brother   . Diabetes Sister   . Heart attack Brother        x 2    Social History Social History  Substance Use Topics  . Smoking status: Current Some Day Smoker    Packs/day: 0.20    Types: Cigarettes  .  Smokeless tobacco: Never Used     Comment: 10-15 cigarettes  per day as of 05/01/17 ep  . Alcohol use No    Current Outpatient Prescriptions  Medication Sig Dispense Refill  . ACCU-CHEK AVIVA PLUS test strip CHECK BLOOD SUGAR TWO TIMES DAILY 200 each 3  . ACCU-CHEK SOFTCLIX LANCETS lancets Patient is to test two times a day DX E11.9 200 each 3  . aspirin EC 81 MG EC tablet Take 1 tablet (81 mg total) by mouth daily. 30 tablet 0  . Blood Glucose Monitoring Suppl (ACCU-CHEK AVIVA PLUS) w/Device KIT 1 kit by Does not apply route 2 (two) times daily. 1 kit 0  . clopidogrel (PLAVIX) 75 MG tablet TAKE 1 TABLET BY MOUTH  DAILY (Patient taking differently: Take 75 mg by mouth once a day) 90 tablet 1  . JANUMET 50-1000 MG tablet take 1 tablet by mouth twice a day with food 180 tablet 0  . LISINOPRIL PO Take 20 mg by mouth.    . rosuvastatin (CRESTOR) 20 MG tablet take 1 tablet by mouth once daily 90 tablet 3   No current facility-administered medications for this visit.     Allergies  Allergen Reactions  . Codeine Nausea And Vomiting  . Lipitor [Atorvastatin] Nausea And Vomiting  . Phenergan [Promethazine Hcl] Other (See Comments)    Confusion, hallucinations, severe agitation    Review of Systems  Constitutional: Negative for activity change, appetite change, chills, fever and unexpected weight change.  HENT: Negative for trouble swallowing and voice change.   Eyes: Positive for visual disturbance.  Respiratory: Negative for cough, shortness of breath and wheezing.   Cardiovascular: Negative for chest pain, palpitations and leg swelling.  Gastrointestinal: Positive for abdominal pain (Hiatal hernia with reflux). Negative for blood in stool.  Genitourinary: Negative for difficulty urinating and dysuria.  Musculoskeletal: Positive for arthralgias (knee).  Neurological: Positive for speech difficulty. Negative for seizures, syncope and weakness.  Hematological: Negative for adenopathy.  Bruises/bleeds easily.    BP 115/69   Pulse 89   Ht 5' 5.5" (1.664 m)   Wt 144 lb (65.3 kg)   SpO2 98%   BMI 23.60 kg/m  Physical Exam  Constitutional: She is oriented to person, place, and time. She appears well-developed and well-nourished. No distress.  HENT:  Head: Normocephalic and atraumatic.  Mouth/Throat: No oropharyngeal exudate.  Eyes: Conjunctivae and EOM are normal. No  scleral icterus.  Neck: Neck supple. No thyromegaly present.  Well-healed scar  Cardiovascular: Normal rate and regular rhythm.  Exam reveals no gallop and no friction rub.   Murmur (2/6 systolic) heard. Pulmonary/Chest: Effort normal and breath sounds normal. No respiratory distress. She has no wheezes. She has no rales.  Abdominal: Soft. She exhibits no distension. There is no tenderness.  Musculoskeletal: She exhibits no edema.  Lymphadenopathy:    She has no cervical adenopathy.  Neurological: She is alert and oriented to person, place, and time. No cranial nerve deficit. She exhibits normal muscle tone.  Motor grossly intact  Skin: Skin is warm and dry.  Psychiatric:  Anxious  Vitals reviewed.    Diagnostic Tests: CT CHEST, ABDOMEN, AND PELVIS WITH CONTRAST  TECHNIQUE: Multidetector CT imaging of the chest, abdomen and pelvis was performed following the standard protocol during bolus administration of intravenous contrast.  CONTRAST:  38m ISOVUE-300 IOPAMIDOL (ISOVUE-300) INJECTION 61%  COMPARISON:  None.  FINDINGS: CT CHEST FINDINGS  Cardiovascular: Diffuse coronary artery calcifications are seen. The heart remains normal in size. Scattered calcification is noted along the aortic arch. The great vessels are unremarkable in appearance.  Mediastinum/Nodes: Visualized mediastinal nodes remain normal in size. No pericardial effusion is identified. The visualized portions of the thyroid gland are unremarkable. No axillary lymphadenopathy is seen.  Lungs/Pleura: There is a  multilobulated mildly spiculated mass at the right upper lobe, measuring 3.3 x 2.7 x 3.2 cm. There appears to be some degree of associated cavitation. This is concerning for primary bronchogenic malignancy. No definite satellite nodules are identified. A tiny calcified granuloma is noted at the right lung apex. No pleural effusion or pneumothorax is identified.  Musculoskeletal: No acute osseous abnormalities are identified. The visualized musculature is unremarkable in appearance. NUCLEAR MEDICINE PET SKULL BASE TO THIGH  TECHNIQUE: 7.0 mCi F-18 FDG was injected intravenously. Full-ring PET imaging was performed from the skull base to thigh after the radiotracer. CT data was obtained and used for attenuation correction and anatomic localization.  FASTING BLOOD GLUCOSE:  Value: 245 mg/dl  COMPARISON:  CT chest 02/16/2017  FINDINGS: NECK: No hypermetabolic lymph nodes in the neck.  CHEST: Of normal heart size. Aortic atherosclerosis. Calcification within the LAD, left circumflex and RCA coronary artery noted. No pericardial effusion. No mediastinal or hilar adenopathy identified. Within the right upper lobe there is a intensely hypermetabolic cavitary mass measuring 4.1 cm within SUV max equal to 10.9. No additional pulmonary nodules or masses identified.  ABDOMEN/PELVIS: No abnormal hypermetabolic activity within the liver, pancreas, adrenal glands, or spleen. Aortic atherosclerosis noted. No aneurysm. Ventral abdominal wall hernia is also identified which contains fat only. No hypermetabolic lymph nodes in the abdomen or pelvis.  SKELETON: No focal hypermetabolic activity to suggest skeletal metastasis.  IMPRESSION: 1. There is intense radiotracer uptake associated with the right upper lobe cavitary lung mass which is worrisome for primary bronchogenic carcinoma. Favor squamous cell carcinoma given the presence of cavitation. 2. No evidence for hypermetabolic  lymph nodes within the chest or distant metastatic disease. 3. Aortic Atherosclerosis (ICD10-I70.0). Three vessel coronary artery calcifications noted.   Electronically Signed   By: TKerby MoorsM.D.   On: 04/26/2017 12:04  PULMONARY FUNCTION TESTS FVC= 2.32 (78%) FEV1= 1.71 (77%) FEV1= 1.82 (82%) postbronchodilator DLCO= 20.25 (77%)  I reviewed the CT and PET/CT images and reviewed them with [name] her family.  I concur with the findings noted above  Impression: [name] a 76year old woman  with history of tobacco abuse, hypertension, hyperlipidemia, type 2 diabetes comp gated by neuropathy, atherosclerotic cardiovascular disease with a previous stroke and carotid endarterectomy, and Clostridium difficile colitis.  She now has been found to have a cavitary right upper lobe mass.  This was first noted in August and was 3.3 cm.  It now measures 4.1 cm.  This is markedly hypermetabolic by PET CT.  This is a new primary bronchogenic carcinoma until proven otherwise.  Right upper lobe mass-this appears to be a T2 N0 bronchogenic carcinoma.  Most likely a squamous cell given the cavitation.  This does appear resectable and she does have adequate pulmonary reserve to tolerate a lobectomy.  However, she would be a high risk patient given her comorbidities particularly in terms of her prior stroke and atherosclerotic cardiovascular disease including coronary calcification.  I discussed the potential treatment options including surgical resection versus radiation with or without chemotherapy.  In all likelihood even with surgical resection we would still recommend chemotherapy given the large size of the tumor.  Discussed the relative advantages and disadvantages of each of those modalities.  She understands that surgery would have a better chance of cure, although it is by no means guaranteed given the size of the tumor (approximately 60% 5-year survival).  I did describe the  operative approach which would be a right VATS for right upper lobectomy with node dissection.  They understand this would be done in the operating room under general anesthesia.  We discussed the incisions to be used, the use of a drainage tube postoperatively, the expected hospital stay, and the overall recovery.  I informed her of the indications, risks, benefits, and alternatives.  She understands the risk include, but not limited to death, MI, DVT, PE, stroke, bleeding, possible need for transfusion, infection, prolonged air leak, cardiac arrhythmias, chronic pain, as well as possibility of other unforeseeable complications.  She is uncertain as to how she would like to proceed.  She would like to talk to her radiation oncologist before making a final decision.  I will arrange that.  Coronary artery atherosclerosis-extensive coronary calcification on CT and PET/CT.  No anginal symptoms at the current time.  Plan: Radiation oncology consult  I will plan to see her back in 2weeks unless she makes a decision before that.  Melrose Nakayama, MD [facility] [phone]

## 2017-05-09 NOTE — Progress Notes (Signed)
Thoracic Location of Tumor / Histology: right upper lobe mass  Patient presented with the following results on CT scan from 02/16/17 - "Multilobulated mildly spiculated mass at the right upper lung lobe, measuring 3.3 x 2.7 x 3.2 cm. Associated cavitation noted."  Biopsies have not been done.  Tobacco/Marijuana/Snuff/ETOH use: current smoker.  Smoked 1 ppd for 60 years.  Currently smokes 2 cigarettes per day.  Denies ETOH or marijuana use.  Past/Anticipated interventions by cardiothoracic surgery, if any: Dr. Roxan Hockey is recommending right VATS for right upper lobectomy with node dissection vs radiation.  Past/Anticipated interventions by medical oncology, if any: possible chemotherapy with radiation.  Signs/Symptoms  Weight changes, if any: no  Respiratory complaints, if any: has occasional dry cough  Hemoptysis, if any: no  Pain issues, if any:  no  SAFETY ISSUES:  Prior radiation? no  Pacemaker/ICD? no   Possible current pregnancy?no  Is the patient on methotrexate? no  Current Complaints / other details:  Patient is here with her husband and daughter.  BP (!) 142/56 (BP Location: Left Arm, Patient Position: Sitting)   Pulse 84   Temp 98.1 F (36.7 C) (Oral)   Ht 5' 5.5" (1.664 m)   Wt 144 lb (65.3 kg)   SpO2 100%   BMI 23.60 kg/m    Wt Readings from Last 3 Encounters:  05/13/17 144 lb (65.3 kg)  05/08/17 144 lb (65.3 kg)  05/01/17 144 lb (65.3 kg)

## 2017-05-13 ENCOUNTER — Encounter: Payer: Self-pay | Admitting: Radiation Oncology

## 2017-05-13 ENCOUNTER — Ambulatory Visit
Admission: RE | Admit: 2017-05-13 | Discharge: 2017-05-13 | Disposition: A | Discharge status: Home / Self Care | Payer: Medicare Other | Source: Ambulatory Visit | Attending: Radiation Oncology | Admitting: Radiation Oncology

## 2017-05-13 DIAGNOSIS — Z79899 Other long term (current) drug therapy: Secondary | ICD-10-CM | POA: Diagnosis not present

## 2017-05-13 DIAGNOSIS — C3411 Malignant neoplasm of upper lobe, right bronchus or lung: Secondary | ICD-10-CM | POA: Diagnosis not present

## 2017-05-13 DIAGNOSIS — Z8249 Family history of ischemic heart disease and other diseases of the circulatory system: Secondary | ICD-10-CM | POA: Insufficient documentation

## 2017-05-13 DIAGNOSIS — F17210 Nicotine dependence, cigarettes, uncomplicated: Secondary | ICD-10-CM | POA: Insufficient documentation

## 2017-05-13 DIAGNOSIS — R918 Other nonspecific abnormal finding of lung field: Secondary | ICD-10-CM

## 2017-05-13 DIAGNOSIS — Z885 Allergy status to narcotic agent status: Secondary | ICD-10-CM | POA: Insufficient documentation

## 2017-05-13 DIAGNOSIS — Z806 Family history of leukemia: Secondary | ICD-10-CM | POA: Insufficient documentation

## 2017-05-13 DIAGNOSIS — E1140 Type 2 diabetes mellitus with diabetic neuropathy, unspecified: Secondary | ICD-10-CM | POA: Insufficient documentation

## 2017-05-13 DIAGNOSIS — Z888 Allergy status to other drugs, medicaments and biological substances status: Secondary | ICD-10-CM | POA: Insufficient documentation

## 2017-05-13 DIAGNOSIS — Z7984 Long term (current) use of oral hypoglycemic drugs: Secondary | ICD-10-CM | POA: Insufficient documentation

## 2017-05-13 DIAGNOSIS — Z833 Family history of diabetes mellitus: Secondary | ICD-10-CM | POA: Diagnosis not present

## 2017-05-13 DIAGNOSIS — K219 Gastro-esophageal reflux disease without esophagitis: Secondary | ICD-10-CM | POA: Diagnosis not present

## 2017-05-13 DIAGNOSIS — Z9049 Acquired absence of other specified parts of digestive tract: Secondary | ICD-10-CM | POA: Diagnosis not present

## 2017-05-13 DIAGNOSIS — Z7902 Long term (current) use of antithrombotics/antiplatelets: Secondary | ICD-10-CM | POA: Insufficient documentation

## 2017-05-13 DIAGNOSIS — I10 Essential (primary) hypertension: Secondary | ICD-10-CM | POA: Diagnosis not present

## 2017-05-13 DIAGNOSIS — Z510 Encounter for antineoplastic radiation therapy: Secondary | ICD-10-CM | POA: Insufficient documentation

## 2017-05-13 DIAGNOSIS — E785 Hyperlipidemia, unspecified: Secondary | ICD-10-CM | POA: Insufficient documentation

## 2017-05-13 DIAGNOSIS — Z8673 Personal history of transient ischemic attack (TIA), and cerebral infarction without residual deficits: Secondary | ICD-10-CM | POA: Insufficient documentation

## 2017-05-13 DIAGNOSIS — Z8701 Personal history of pneumonia (recurrent): Secondary | ICD-10-CM | POA: Diagnosis not present

## 2017-05-13 DIAGNOSIS — Z7982 Long term (current) use of aspirin: Secondary | ICD-10-CM | POA: Diagnosis not present

## 2017-05-13 NOTE — Progress Notes (Signed)
Radiation Oncology         [phone] ________________________________  Initial Outpatient Consultation  Name: [name] MRN: [id number]  Date: 05/13/2017  DOB: [birth year]  GY:IRSWNIO, Elyse Jarvis, MD  Melrose Nakayama, *   REFERRING PHYSICIAN: Melrose Nakayama, *  DIAGNOSIS: solitary pulmonary nodule presenting in the right upper lobe  HISTORY OF PRESENT ILLNESS::[name] is a 76 y.o. female who is here for consultation for radiation therapy for presumptive right upper lobe lung cancer. On 02/16/2017, a CT of the abdomen/pelvis and chest was done revealing a multi lobulated, mildly spiculated mass at the right upper lung lobe, measuring 3.3 x 2.7 x 3.2 cm. There was no evidence of metastatic disease on this scan.  On 04/26/2017 a PET scan from skull base to thigh was completed revealing intense radiotracer uptake associated with the right upper lobe cavitary lung mass which is worrisome for primary bronchogenic carcinoma favoring squamous cell carcinoma. There is no evidence for hypermetabolic lymph nodes within the chest or metastatic disease.  Pt presents to the office today accompanied by her daughter and her husband. She was referred by Dr. Roxan Hockey for consultation for radiation therapy. He is recommending right VATS for right upper lobectomy with node dissection. Patient is apprehensive to have surgery at this time in light of her multiple medical issues.  On review of systems, she reports an intermittent dry cough. She denies SOB, hemoptysis, weight changes, or pain. She is a current smoker of 2 cigarettes daily. She denies ETOH or marijuana use.    PREVIOUS RADIATION THERAPY: No  PAST MEDICAL HISTORY:  has a past medical history of Carotid artery occlusion; Clostridium difficile infection (06/2013); Diabetes mellitus; Dyslipidemia; Gallstones; GERD (gastroesophageal reflux disease); Heart murmur; Hypertension; Impaired speech; Neuropathy, diabetic ([place]);  Pneumonia; PONV (postoperative nausea and vomiting); Smoker; Stroke ([place]) (06/25/13); and Vertigo.    PAST SURGICAL HISTORY: Past Surgical History:  Procedure Laterality Date  . cataract removed Right   . CHOLECYSTECTOMY    . ENDARTERECTOMY Left 07/14/2013   Procedure: ENDARTERECTOMY CAROTID-LEFT;  Surgeon: Elam Dutch, MD;  Location: [facility];  Service: Vascular;  Laterality: Left;  . KNEE SURGERY Left 76yr ago  . LAPAROSCOPIC PARTIAL COLECTOMY N/A 09/02/2015   Procedure: LAPAROSCOPIC PARTIAL RIGHT COLECTOMY;  Surgeon: ALeighton Ruff MD;  Location[facility];  Service: General;  Laterality: N/A;  . PATCH ANGIOPLASTY Left 07/14/2013   Procedure: LEFT CAROTID ARTERY PATCH ANGIOPLASTY;  Surgeon: CElam Dutch MD;  [place] [facility]  Service: Vascular;  Laterality: Left;    FAMILY HISTORY: family history includes Cancer in her father; Diabetes in her brother, maternal aunt, maternal uncle, mother, and sister; Heart attack in her brother; Heart disease in her father; Leukemia in her mother.  SOCIAL HISTORY:  reports that she has been smoking Cigarettes.  She has a 60.00 pack-year smoking history. She has never used smokeless tobacco. She reports that she does not drink alcohol or use drugs.  ALLERGIES: Codeine; Lipitor [atorvastatin]; and Phenergan [promethazine hcl]  MEDICATIONS:  Current Outpatient Prescriptions  Medication Sig Dispense Refill  . ACCU-CHEK AVIVA PLUS test strip CHECK BLOOD SUGAR TWO TIMES DAILY 200 each 3  . ACCU-CHEK SOFTCLIX LANCETS lancets Patient is to test two times a day DX E11.9 200 each 3  . aspirin EC 81 MG EC tablet Take 1 tablet (81 mg total) by mouth daily. 30 tablet 0  . Blood Glucose Monitoring Suppl (ACCU-CHEK AVIVA PLUS) w/Device KIT 1 kit by Does not apply  route 2 (two) times daily. 1 kit 0  . clopidogrel (PLAVIX) 75 MG tablet TAKE 1 TABLET BY MOUTH  DAILY (Patient taking differently: Take 75 mg by mouth once a day) 90 tablet 1  . JANUMET 50-1000 MG  tablet take 1 tablet by mouth twice a day with food 180 tablet 0  . LISINOPRIL PO Take 20 mg by mouth.    . rosuvastatin (CRESTOR) 20 MG tablet take 1 tablet by mouth once daily 90 tablet 3   No current facility-administered medications for this encounter.     REVIEW OF SYSTEMS:  A 10+ POINT REVIEW OF SYSTEMS WAS OBTAINED including neurology, dermatology, psychiatry, cardiac, respiratory, lymph, extremities, GI, GU, Musculoskeletal, constitutional, breasts, reproductive, HEENT.  All pertinent positives are noted in the HPI.  All others are negative.   PHYSICAL EXAM:  height is 5' 5.5" (1.664 m) and weight is 144 lb (65.3 kg). Her oral temperature is 98.1 F (36.7 C). Her blood pressure is 142/56 (abnormal) and her pulse is 84. Her oxygen saturation is 100%.   General: Alert and oriented, in no acute distress HEENT: Head is normocephalic. Extraocular movements are intact. Oropharynx is clear. Neck: Neck is supple, no palpable cervical or supraclavicular lymphadenopathy. Heart: Regular in rate and rhythm with no murmurs, rubs, or gallops. Chest: Clear to auscultation bilaterally, with no rhonchi, wheezes, or rales. Abdomen: Soft, nontender, nondistended, with no rigidity or guarding. Extremities: No cyanosis or edema. Lymphatics: see Neck Exam Skin: No concerning lesions. Musculoskeletal: symmetric strength and muscle tone throughout. Neurologic: Cranial nerves II through XII are grossly intact. No obvious focalities. Speech is fluent. Coordination is intact. Psychiatric: Judgment and insight are intact. Affect is appropriate.   ECOG = 1  LABORATORY DATA:  Lab Results  Component Value Date   WBC 14.3 (H) 02/15/2017   HGB 12.2 02/15/2017   HCT 36.0 02/15/2017   MCV 90.1 02/15/2017   PLT 477 (H) 02/15/2017   NEUTROABS 10.4 (H) 02/15/2017   Lab Results  Component Value Date   NA 137 02/17/2017   K 3.8 02/17/2017   CL 103 02/17/2017   CO2 26 02/17/2017   GLUCOSE 163 (H)  02/17/2017   CREATININE 1.05 (H) 02/17/2017   CALCIUM 8.7 (L) 02/17/2017      RADIOGRAPHY: Nm Pet Image Initial (pi) Skull Base To Thigh  Result Date: 04/26/2017 CLINICAL DATA:  Initial treatment strategy for lung cancer. EXAM: NUCLEAR MEDICINE PET SKULL BASE TO THIGH TECHNIQUE: 7.0 mCi F-18 FDG was injected intravenously. Full-ring PET imaging was performed from the skull base to thigh after the radiotracer. CT data was obtained and used for attenuation correction and anatomic localization. FASTING BLOOD GLUCOSE:  Value: 245 mg/dl COMPARISON:  CT chest 02/16/2017 FINDINGS: NECK: No hypermetabolic lymph nodes in the neck. CHEST: Of normal heart size. Aortic atherosclerosis. Calcification within the LAD, left circumflex and RCA coronary artery noted. No pericardial effusion. No mediastinal or hilar adenopathy identified. Within the right upper lobe there is a intensely hypermetabolic cavitary mass measuring 4.1 cm within SUV max equal to 10.9. No additional pulmonary nodules or masses identified. ABDOMEN/PELVIS: No abnormal hypermetabolic activity within the liver, pancreas, adrenal glands, or spleen. Aortic atherosclerosis noted. No aneurysm. Ventral abdominal wall hernia is also identified which contains fat only. No hypermetabolic lymph nodes in the abdomen or pelvis. SKELETON: No focal hypermetabolic activity to suggest skeletal metastasis. IMPRESSION: 1. There is intense radiotracer uptake associated with the right upper lobe cavitary lung mass which is worrisome for primary bronchogenic  carcinoma. Favor squamous cell carcinoma given the presence of cavitation. 2. No evidence for hypermetabolic lymph nodes within the chest or distant metastatic disease. 3. Aortic Atherosclerosis (ICD10-I70.0). Three vessel coronary artery calcifications noted. Electronically Signed   By: Kerby Moors M.D.   On: 04/26/2017 12:04      IMPRESSION: solitary pulmonary nodule/mass presenting in the right upper  lobe  SARRAH FIORENZA is  76 y.o. female who presents today to discuss the role of radiotherapy in the ongoing management of her presumptive lung cancer. Patient will be a good candidate for a definitive course of radiotherapy for right upper lobe lung cancer either SBRT or hypo fractionated radiation therapy (10 treatments). The patient is leaning towards radiation therapy at this time but has not made a final decision concerning her treatment approach. Discussed with the patient that I would recommend a biopsy of the area to confirm malignancy prior to initiation of radiation therapy. This may be a CT guided biopsy or a bronchoscopy. To determine the best treatment approach to obtain tissue, I will present her case at the thoracic conference this Thursday.    PLAN: Treatment details pending final decision by patient (surgery vs xrt) and potential biopsy results if she decides to proceed with a definitive course of radiation therapy.    ------------------------------------------------  Blair Promise, PhD, MD  This document serves as a record of services personally performed by Gery Pray, MD. It was created on his behalf by Marlowe Kays, a trained medical scribe. The creation of this record is based on the scribe's personal observations and the provider's statements to them. This document has been checked and approved by the attending provider.

## 2017-05-16 ENCOUNTER — Encounter: Payer: Self-pay | Admitting: *Deleted

## 2017-05-16 ENCOUNTER — Telehealth: Payer: Self-pay | Admitting: *Deleted

## 2017-05-16 DIAGNOSIS — R918 Other nonspecific abnormal finding of lung field: Secondary | ICD-10-CM

## 2017-05-16 NOTE — Telephone Encounter (Signed)
Oncology Nurse Navigator Documentation  Oncology Nurse Navigator Flowsheets 05/16/2017  Navigator Location [facility]  Navigator Encounter Type Telephone/I received a call from patient's daughter. I updated her on cancer conference discussion and that radiology central scheduling will be call with an appt for biopsy. She was thankful for the update.   Telephone Incoming Call  Treatment Phase Abnormal Scans  Barriers/Navigation Needs Education  Education Other  Interventions Education  Education Method Verbal  Acuity Level 1  Time Spent with Patient 15

## 2017-05-16 NOTE — Telephone Encounter (Signed)
Oncology Nurse Navigator Documentation  Oncology Nurse Navigator Flowsheets 05/16/2017  Navigator Location [facility]  Navigator Encounter Type Telephone/per Dr. Sondra Come, I completed order for CT biopsy.  I called patient home and mobil phone but was unable to reach.  I left vm message with my name and phone number to call.   Telephone Outgoing Call  Treatment Phase Abnormal Scans  Barriers/Navigation Needs Coordination of Care;Education  Education Other  Interventions Coordination of Care;Education  Coordination of Care Other  Education Method Verbal  Acuity Level 2  Time Spent with Patient 30

## 2017-05-22 ENCOUNTER — Other Ambulatory Visit: Payer: Self-pay | Admitting: Radiology

## 2017-05-22 ENCOUNTER — Encounter: Payer: Self-pay | Admitting: Radiation Oncology

## 2017-05-23 ENCOUNTER — Encounter (HOSPITAL_COMMUNITY): Payer: Self-pay

## 2017-05-23 ENCOUNTER — Ambulatory Visit (HOSPITAL_COMMUNITY)
Admission: RE | Admit: 2017-05-23 | Discharge: 2017-05-23 | Disposition: A | Discharge status: Home / Self Care | Payer: Medicare Other | Source: Ambulatory Visit | Attending: Interventional Radiology | Admitting: Interventional Radiology

## 2017-05-23 ENCOUNTER — Other Ambulatory Visit: Payer: Self-pay | Admitting: Physician Assistant

## 2017-05-23 ENCOUNTER — Ambulatory Visit (HOSPITAL_COMMUNITY): Payer: Medicare Other

## 2017-05-23 ENCOUNTER — Other Ambulatory Visit (HOSPITAL_COMMUNITY): Payer: Self-pay | Admitting: Interventional Radiology

## 2017-05-23 ENCOUNTER — Observation Stay (HOSPITAL_COMMUNITY)
Admission: RE | Admit: 2017-05-23 | Discharge: 2017-05-24 | Disposition: A | Discharge status: Home / Self Care | Payer: Medicare Other | Source: Ambulatory Visit | Attending: Radiation Oncology | Admitting: Radiation Oncology

## 2017-05-23 DIAGNOSIS — J939 Pneumothorax, unspecified: Secondary | ICD-10-CM

## 2017-05-23 DIAGNOSIS — Z7902 Long term (current) use of antithrombotics/antiplatelets: Secondary | ICD-10-CM | POA: Diagnosis not present

## 2017-05-23 DIAGNOSIS — Y848 Other medical procedures as the cause of abnormal reaction of the patient, or of later complication, without mention of misadventure at the time of the procedure: Secondary | ICD-10-CM | POA: Insufficient documentation

## 2017-05-23 DIAGNOSIS — C3411 Malignant neoplasm of upper lobe, right bronchus or lung: Principal | ICD-10-CM | POA: Insufficient documentation

## 2017-05-23 DIAGNOSIS — Z8673 Personal history of transient ischemic attack (TIA), and cerebral infarction without residual deficits: Secondary | ICD-10-CM | POA: Diagnosis not present

## 2017-05-23 DIAGNOSIS — E1140 Type 2 diabetes mellitus with diabetic neuropathy, unspecified: Secondary | ICD-10-CM | POA: Diagnosis not present

## 2017-05-23 DIAGNOSIS — J95811 Postprocedural pneumothorax: Secondary | ICD-10-CM

## 2017-05-23 DIAGNOSIS — K219 Gastro-esophageal reflux disease without esophagitis: Secondary | ICD-10-CM | POA: Diagnosis not present

## 2017-05-23 DIAGNOSIS — R918 Other nonspecific abnormal finding of lung field: Secondary | ICD-10-CM | POA: Diagnosis not present

## 2017-05-23 DIAGNOSIS — I10 Essential (primary) hypertension: Secondary | ICD-10-CM | POA: Diagnosis not present

## 2017-05-23 DIAGNOSIS — I2510 Atherosclerotic heart disease of native coronary artery without angina pectoris: Secondary | ICD-10-CM | POA: Insufficient documentation

## 2017-05-23 DIAGNOSIS — I700 Atherosclerosis of aorta: Secondary | ICD-10-CM | POA: Diagnosis not present

## 2017-05-23 DIAGNOSIS — I6529 Occlusion and stenosis of unspecified carotid artery: Secondary | ICD-10-CM | POA: Diagnosis not present

## 2017-05-23 DIAGNOSIS — E785 Hyperlipidemia, unspecified: Secondary | ICD-10-CM | POA: Insufficient documentation

## 2017-05-23 DIAGNOSIS — Z8709 Personal history of other diseases of the respiratory system: Secondary | ICD-10-CM | POA: Diagnosis not present

## 2017-05-23 DIAGNOSIS — F17210 Nicotine dependence, cigarettes, uncomplicated: Secondary | ICD-10-CM | POA: Insufficient documentation

## 2017-05-23 HISTORY — PX: IR PERC PLEURAL DRAIN W/INDWELL CATH W/IMG GUIDE: IMG5383

## 2017-05-23 LAB — CBC
HCT: 34.9 % — ABNORMAL LOW (ref 36.0–46.0)
Hemoglobin: 11.3 g/dL — ABNORMAL LOW (ref 12.0–15.0)
MCH: 29 pg (ref 26.0–34.0)
MCHC: 32.4 g/dL (ref 30.0–36.0)
MCV: 89.5 fL (ref 78.0–100.0)
PLATELETS: 497 10*3/uL — AB (ref 150–400)
RBC: 3.9 MIL/uL (ref 3.87–5.11)
RDW: 14.3 % (ref 11.5–15.5)
WBC: 13.4 10*3/uL — ABNORMAL HIGH (ref 4.0–10.5)

## 2017-05-23 LAB — APTT: APTT: 28 s (ref 24–36)

## 2017-05-23 LAB — PROTIME-INR
INR: 0.98
PROTHROMBIN TIME: 12.9 s (ref 11.4–15.2)

## 2017-05-23 LAB — GLUCOSE, CAPILLARY: GLUCOSE-CAPILLARY: 197 mg/dL — AB (ref 65–99)

## 2017-05-23 MED ORDER — FENTANYL CITRATE (PF) 100 MCG/2ML IJ SOLN
INTRAMUSCULAR | Status: AC
  Filled 2017-05-23: qty 2

## 2017-05-23 MED ORDER — METOCLOPRAMIDE HCL 5 MG/ML IJ SOLN
5.0000 mg | Freq: Four times a day (QID) | INTRAMUSCULAR | Status: DC | PRN
  Administered 2017-05-23 – 2017-05-24 (×2): 5 mg via INTRAVENOUS
  Filled 2017-05-23 (×2): qty 2

## 2017-05-23 MED ORDER — ONDANSETRON HCL 4 MG/2ML IJ SOLN
4.0000 mg | Freq: Once | INTRAMUSCULAR | Status: AC
  Administered 2017-05-23: 4 mg via INTRAVENOUS
  Filled 2017-05-23: qty 2

## 2017-05-23 MED ORDER — ONDANSETRON HCL 4 MG/2ML IJ SOLN: 4.0000 mg | Freq: Four times a day (QID) | INTRAMUSCULAR | Status: DC | PRN

## 2017-05-23 MED ORDER — KETOROLAC TROMETHAMINE 15 MG/ML IJ SOLN
15.0000 mg | Freq: Four times a day (QID) | INTRAMUSCULAR | Status: DC
  Administered 2017-05-23: 15 mg via INTRAVENOUS
  Filled 2017-05-23 (×3): qty 1

## 2017-05-23 MED ORDER — MIDAZOLAM HCL 2 MG/2ML IJ SOLN
INTRAMUSCULAR | Status: AC
  Filled 2017-05-23: qty 2

## 2017-05-23 MED ORDER — ONDANSETRON HCL 4 MG PO TABS: 4.0000 mg | ORAL_TABLET | Freq: Four times a day (QID) | ORAL | Status: DC | PRN

## 2017-05-23 MED ORDER — SODIUM CHLORIDE 0.9 % IV SOLN: INTRAVENOUS | Status: DC

## 2017-05-23 MED ORDER — SENNOSIDES-DOCUSATE SODIUM 8.6-50 MG PO TABS: 1.0000 | ORAL_TABLET | Freq: Every evening | ORAL | Status: DC | PRN

## 2017-05-23 MED ORDER — ONDANSETRON HCL 4 MG/2ML IJ SOLN
INTRAMUSCULAR | Status: AC
  Administered 2017-05-23: 4 mg via INTRAVENOUS
  Filled 2017-05-23: qty 2

## 2017-05-23 MED ORDER — FENTANYL CITRATE (PF) 100 MCG/2ML IJ SOLN
INTRAMUSCULAR | Status: AC | PRN
  Administered 2017-05-23 (×2): 25 ug via INTRAVENOUS

## 2017-05-23 MED ORDER — METOCLOPRAMIDE HCL 5 MG/ML IJ SOLN
5.0000 mg | Freq: Four times a day (QID) | INTRAMUSCULAR | Status: DC | PRN
  Filled 2017-05-23: qty 1

## 2017-05-23 MED ORDER — MIDAZOLAM HCL 2 MG/2ML IJ SOLN
INTRAMUSCULAR | Status: AC | PRN
  Administered 2017-05-23 (×2): 1 mg via INTRAVENOUS

## 2017-05-23 MED ORDER — FENTANYL CITRATE (PF) 100 MCG/2ML IJ SOLN
INTRAMUSCULAR | Status: AC | PRN
  Administered 2017-05-23: 50 ug via INTRAVENOUS

## 2017-05-23 MED ORDER — ONDANSETRON HCL 4 MG/2ML IJ SOLN
4.0000 mg | Freq: Once | INTRAMUSCULAR | Status: AC
  Administered 2017-05-23: 4 mg via INTRAVENOUS

## 2017-05-23 MED ORDER — ACETAMINOPHEN 325 MG PO TABS
650.0000 mg | ORAL_TABLET | Freq: Four times a day (QID) | ORAL | Status: DC | PRN
  Filled 2017-05-23: qty 2

## 2017-05-23 MED ORDER — LISINOPRIL 20 MG PO TABS
20.0000 mg | ORAL_TABLET | Freq: Every day | ORAL | Status: DC
  Filled 2017-05-23: qty 1

## 2017-05-23 MED ORDER — SODIUM CHLORIDE 0.9% FLUSH
3.0000 mL | INTRAVENOUS | Status: DC | PRN
Start: 2017-05-23 — End: 2017-05-24

## 2017-05-23 MED ORDER — ACETAMINOPHEN 325 MG PO TABS: 650.0000 mg | ORAL_TABLET | Freq: Four times a day (QID) | ORAL | Status: DC | PRN

## 2017-05-23 MED ORDER — ROSUVASTATIN CALCIUM 20 MG PO TABS: 20.0000 mg | ORAL_TABLET | Freq: Every day | ORAL | Status: DC

## 2017-05-23 MED ORDER — SODIUM CHLORIDE 0.9 % IV SOLN: 250.0000 mL | INTRAVENOUS | Status: DC | PRN

## 2017-05-23 MED ORDER — SODIUM CHLORIDE 0.9 % IV SOLN
INTRAVENOUS | Status: DC
  Administered 2017-05-23: 19:00:00 via INTRAVENOUS

## 2017-05-23 MED ORDER — INSULIN ASPART 100 UNIT/ML ~~LOC~~ SOLN: 0.0000 [IU] | SUBCUTANEOUS | Status: DC

## 2017-05-23 MED ORDER — ACETAMINOPHEN 650 MG RE SUPP
650.0000 mg | Freq: Four times a day (QID) | RECTAL | Status: DC | PRN
  Filled 2017-05-23: qty 1

## 2017-05-23 MED ORDER — SODIUM CHLORIDE 0.9% FLUSH: 3.0000 mL | Freq: Two times a day (BID) | INTRAVENOUS | Status: DC

## 2017-05-23 MED ORDER — LIDOCAINE HCL 1 % IJ SOLN
INTRAMUSCULAR | Status: AC
  Filled 2017-05-23: qty 20

## 2017-05-23 MED ORDER — KETOROLAC TROMETHAMINE 30 MG/ML IJ SOLN
INTRAMUSCULAR | Status: AC
  Filled 2017-05-23: qty 1

## 2017-05-23 MED ORDER — ASPIRIN EC 81 MG PO TBEC
81.0000 mg | DELAYED_RELEASE_TABLET | Freq: Every day | ORAL | Status: DC
  Filled 2017-05-23: qty 1

## 2017-05-23 NOTE — Sedation Documentation (Signed)
Pt states she is very nauseated and sick on her stomach. Denies pain

## 2017-05-23 NOTE — H&P (Signed)
Chief Complaint: RUL mass  Referring Physician(s): Kinard,James  Supervising Physician: Corrie Mckusick  Patient Status: [facility] - Out-pt  History of Present Illness: [name] is a 76 y.o. female with a history of tobacco use (60-pack-years), hypertension, atherosclerotic cardiovascular disease, stroke, and type 2 diabetes.  She was hospitalized in early August with blurred vision and numbness.  She was admitted for a stroke workup.  MRI was negative for stroke.    CT of the head and neck done 02/15/2017 revealed a right upper lobe mass.    A CT of the chest abdomen and pelvis done on 02/16/2017 showed a spiculated right upper lobe mass measuring 3.3 x 2.7 x 3.2 cm.  There was no adenopathy.    She had a PET/CT done on 04/26/2017 which showed the mass increased in size to 4.1 cm and was more cavitary.  It had an SUV of 10.9. There was no evidence of regional or distant metastases.   She is here today for biopsy.   She is NPO. She takes Plavix for her atherosclerotic disease and has held this x 5 days.   Past Medical History:  Diagnosis Date  . Carotid artery occlusion   . Clostridium difficile infection 06/2013  . Diabetes mellitus    takes Janumet daily  . Dyslipidemia    takes Crestor daily  . Gallstones   . GERD (gastroesophageal reflux disease)   . Heart murmur    hx of  . Hypertension    takes Prinzide and Verapamil daily  . Impaired speech    from stroke  . Neuropathy, diabetic ([place])   . Pneumonia   . PONV (postoperative nausea and vomiting)   . Smoker   . Stroke ([place]) 06/25/13  . Vertigo    but doesn't take any meds    Past Surgical History:  Procedure Laterality Date  . cataract removed Right   . CHOLECYSTECTOMY    . KNEE SURGERY Left 22yr ago    Allergies: Codeine; Lipitor [atorvastatin]; and Phenergan [promethazine hcl]  Medications: Prior to Admission medications   Medication Sig Start Date End Date Taking? Authorizing Provider    clopidogrel (PLAVIX) 75 MG tablet TAKE 1 TABLET BY MOUTH  DAILY Patient taking differently: Take 75 mg by mouth once a day 01/07/17  Yes LDenita Lung MD  JANUMET 50-1000 MG tablet take 1 tablet by mouth twice a day with food 04/30/17  Yes Tysinger, DCamelia Eng PA-C  lisinopril (PRINIVIL,ZESTRIL) 20 MG tablet Take 20 mg by mouth daily.    Yes [provider]  rosuvastatin (CRESTOR) 20 MG tablet take 1 tablet by mouth once daily 02/27/17  Yes LDenita Lung MD  ACCU-CHEK AVIVA PLUS test strip CHECK BLOOD SUGAR TWO TIMES DAILY 07/10/16   LDenita Lung MD  ACCU-CHEK SOFTCLIX LANCETS lancets Patient is to test two times a day DX E11.9 11/19/16   LDenita Lung MD  Blood Glucose Monitoring Suppl (ACCU-CHEK AVIVA PLUS) w/Device KIT 1 kit by Does not apply route 2 (two) times daily. 10/31/15   LDenita Lung MD     Family History  Problem Relation Age of Onset  . Leukemia Mother   . Diabetes Mother   . Cancer Father        esophageal ca  . Heart disease Father   . Diabetes Maternal Aunt   . Diabetes Maternal Uncle   . Diabetes Brother   . Diabetes Sister   . Heart attack Brother  x 2    Social History   Socioeconomic History  . Marital status: Married    Spouse name: Jeneen Rinks  . Number of children: 3  . Years of education: 10  . Highest education level: Not on file  Social Needs  . Financial resource strain: Not on file  . Food insecurity - worry: Not on file  . Food insecurity - inability: Not on file  . Transportation needs - medical: Not on file  . Transportation needs - non-medical: Not on file  Occupational History  . Occupation: retired  Tobacco Use  . Smoking status: Current Some Day Smoker    Packs/day: 1.00    Years: 60.00    Pack years: 60.00    Types: Cigarettes  . Smokeless tobacco: Never Used  . Tobacco comment: currently smoking 2 cigarettes per day - 05/13/17  Substance and Sexual Activity  . Alcohol use: No    Alcohol/week: 0.0 oz  . Drug  use: No  . Sexual activity: Yes  Other Topics Concern  . Not on file  Social History Narrative   Patient is married with 3 children.   Patient is right handed.   Patient has 10 th grade education.   Patient drinks 4 cups daily.    Review of Systems: A 12 point ROS discussed Review of Systems  Constitutional: Positive for unexpected weight change. Negative for activity change.  HENT: Negative.   Respiratory: Negative for cough, shortness of breath and wheezing.   Cardiovascular: Negative.   Gastrointestinal: Negative.   Genitourinary: Negative.   Musculoskeletal: Negative.   Skin: Negative.   Neurological: Negative.   Hematological: Negative.   Psychiatric/Behavioral: Negative.     Vital Signs: BP 132/67 (BP Location: Right Arm)   Pulse 71   Temp 97.7 F (36.5 C) (Oral)   Ht _0  (1.676 m)   Wt 144 lb (65.3 kg)   SpO2 99%   BMI 23.24 kg/m   Physical Exam  Constitutional: She is oriented to person, place, and time. She appears well-developed.  HENT:  Head: Normocephalic and atraumatic.  Eyes: EOM are normal.  Neck: Normal range of motion.  Cardiovascular: Normal rate, regular rhythm and normal heart sounds.  Pulmonary/Chest: Effort normal and breath sounds normal. No stridor. No respiratory distress.  Abdominal: Soft. She exhibits no distension. There is no tenderness.  Musculoskeletal: Normal range of motion.  Neurological: She is alert and oriented to person, place, and time.  Skin: Skin is warm and dry.  Psychiatric: She has a normal mood and affect. Her behavior is normal. Judgment and thought content normal.  Vitals reviewed.   Imaging: Nm Pet Image Initial (pi) Skull Base To Thigh  Result Date: 04/26/2017 CLINICAL DATA:  Initial treatment strategy for lung cancer. EXAM: NUCLEAR MEDICINE PET SKULL BASE TO THIGH TECHNIQUE: 7.0 mCi F-18 FDG was injected intravenously. Full-ring PET imaging was performed from the skull base to thigh after the radiotracer. CT  data was obtained and used for attenuation correction and anatomic localization. FASTING BLOOD GLUCOSE:  Value: 245 mg/dl COMPARISON:  CT chest 02/16/2017 FINDINGS: NECK: No hypermetabolic lymph nodes in the neck. CHEST: Of normal heart size. Aortic atherosclerosis. Calcification within the LAD, left circumflex and RCA coronary artery noted. No pericardial effusion. No mediastinal or hilar adenopathy identified. Within the right upper lobe there is a intensely hypermetabolic cavitary mass measuring 4.1 cm within SUV max equal to 10.9. No additional pulmonary nodules or masses identified. ABDOMEN/PELVIS: No abnormal hypermetabolic activity within the liver,  pancreas, adrenal glands, or spleen. Aortic atherosclerosis noted. No aneurysm. Ventral abdominal wall hernia is also identified which contains fat only. No hypermetabolic lymph nodes in the abdomen or pelvis. SKELETON: No focal hypermetabolic activity to suggest skeletal metastasis. IMPRESSION: 1. There is intense radiotracer uptake associated with the right upper lobe cavitary lung mass which is worrisome for primary bronchogenic carcinoma. Favor squamous cell carcinoma given the presence of cavitation. 2. No evidence for hypermetabolic lymph nodes within the chest or distant metastatic disease. 3. Aortic Atherosclerosis (ICD10-I70.0). Three vessel coronary artery calcifications noted. Electronically Signed   By: Kerby Moors M.D.   On: 04/26/2017 12:04    Labs:  CBC: Recent Labs    02/15/17 1712 02/15/17 1715  WBC 14.3*  --   HGB 12.0 12.2  HCT 35.6* 36.0  PLT 477*  --     COAGS: Recent Labs    02/15/17 1712  INR 0.94  APTT 27    BMP: Recent Labs    02/15/17 1712 02/15/17 1715 02/17/17 0422  NA 139 140 137  K 3.9 3.6 3.8  CL 103 101 103  CO2 26  --  26  GLUCOSE 230* 223* 163*  BUN 19 21* 17  CALCIUM 9.0  --  8.7*  CREATININE 1.24* 1.20* 1.05*  GFRNONAA 41*  --  50*  GFRAA 48*  --  58*    LIVER FUNCTION TESTS: Recent  Labs    02/15/17 1712  BILITOT 0.4  AST 14*  ALT 11*  ALKPHOS 67  PROT 6.7  ALBUMIN 3.6    TUMOR MARKERS: No results for input(s): AFPTM, CEA, CA199, CHROMGRNA in the last 8760 hours.  Assessment and Plan:  A 4.1 cm RUL mass  Will proceed with biopsy today by Dr. Earleen Newport.  Risks and benefits discussed with the patient including, but not limited to bleeding, hemoptysis, respiratory failure requiring intubation, infection, pneumothorax requiring chest tube placement, stroke from air embolism or even death.  All of the patient's questions were answered, patient is agreeable to proceed. Consent signed and in chart.  Thank you for this interesting consult.  I greatly enjoyed meeting [name] and look forward to participating in their care.  A copy of this report was sent to the requesting provider on this date.  Electronically Signed: Murrell Redden, PA-C 05/23/2017, 9:57 AM   I spent a total of  30 Minutes   in face to face in clinical consultation, greater than 50% of which was counseling/coordinating care for lung biopsy.

## 2017-05-23 NOTE — Progress Notes (Signed)
Received patient at [facility] from IR post lung biopsy, Ct intact, connected to suction per IR nurse. Patient complaining of nausea, RN from IR will verify MD orders and will call for update.

## 2017-05-23 NOTE — Sedation Documentation (Signed)
Patient is resting comfortably. 

## 2017-05-23 NOTE — Sedation Documentation (Signed)
Procedure finished.

## 2017-05-23 NOTE — Discharge Instructions (Signed)
Lung Biopsy, Care After °This sheet gives you information about how to care for yourself after your procedure. Your health care provider may also give you more specific instructions depending on the type of biopsy you had. If you have problems or questions, contact your health care provider. °What can I expect after the procedure? °After the procedure, it is common to have: °· A cough. °· A sore throat. °· Pain where a needle, bronchoscope, or incision was used to collect a biopsy sample (biopsy site). ° °Follow these instructions at home: °Medicines °· Take over-the-counter and prescription medicines only as told by your health care provider. °· Do not drive for 24 hours if you were given a sedative. °· Do not drink alcohol while taking pain medicine. °· Do not drive or use heavy machinery while taking prescription pain medicine. °· To prevent or treat constipation while you are taking prescription pain medicine, your health care provider may recommend that you: °? Drink enough fluid to keep your urine clear or pale yellow. °? Take over-the-counter or prescription medicines. °? Eat foods that are high in fiber, such as fresh fruits and vegetables, whole grains, and beans. °? Limit foods that are high in fat and processed sugars, such as fried and sweet foods. °Activity °· If you had an incision during your procedure, avoid activities that may pull the incision site open. °· Return to your normal activities as told by your health care provider. Ask your health care provider what activities are safe for you. °If you had an open biopsy:  °· Follow instructions from your health care provider about how to take care of your incision. Make sure you: °? Wash your hands with soap and water before you change your bandage (dressing). If soap and water are not available, use hand sanitizer. °? Change your dressing as told by your health care provider. °? Leave stitches (sutures), skin glue, or adhesive strips in place. These  skin closures may need to stay in place for 2 weeks or longer. If adhesive strip edges start to loosen and curl up, you may trim the loose edges. Do not remove adhesive strips completely unless your health care provider tells you to do that. °· Check your incision area every day for signs of infection. Check for: °? Redness, swelling, or pain. °? Fluid or blood. °? Warmth. °? Pus or a bad smell. °General instructions °· It is up to you to get the results of your procedure. Ask your health care provider, or the department that is doing the procedure, when your results will be ready. °Contact a health care provider if: °· You have a fever. °· You have redness, swelling, or pain around your biopsy site. °· You have fluid or blood coming from your biopsy site. °· Your biopsy site feels warm to the touch. °· You have pus or a bad smell coming from your biopsy site. °Get help right away if: °· You cough up blood. °· You have trouble breathing. °· You have chest pain. °Summary °· After the procedure, it is common to have a sore throat and a cough. °· Return to your normal activities as told by your health care provider. Ask your health care provider what activities are safe for you. °· Take over-the-counter and prescription medicines only as told by your health care provider. °· Report any unusual symptoms to your health care provider. °This information is not intended to replace advice given to you by your health care provider. Make sure   you discuss any questions you have with your health care provider. Document Released: 07/31/2016 Document Revised: 07/31/2016 Document Reviewed: 07/31/2016 Elsevier Interactive Patient Education  2018 [place]. Moderate Conscious Sedation, Adult, Care After These instructions provide you with information about caring for yourself after your procedure. Your health care provider may also give you more specific instructions. Your treatment has been planned according to current  medical practices, but problems sometimes occur. Call your health care provider if you have any problems or questions after your procedure. What can I expect after the procedure? After your procedure, it is common:  To feel sleepy for several hours.  To feel clumsy and have poor balance for several hours.  To have poor judgment for several hours.  To vomit if you eat too soon.  Follow these instructions at home: For at least 24 hours after the procedure:   Do not: ? Participate in activities where you could fall or become injured. ? Drive. ? Use heavy machinery. ? Drink alcohol. ? Take sleeping pills or medicines that cause drowsiness. ? Make important decisions or sign legal documents. ? Take care of children on your own.  Rest. Eating and drinking  Follow the diet recommended by your health care provider.  If you vomit: ? Drink water, juice, or soup when you can drink without vomiting. ? Make sure you have little or no nausea before eating solid foods. General instructions  Have a responsible adult stay with you until you are awake and alert.  Take over-the-counter and prescription medicines only as told by your health care provider.  If you smoke, do not smoke without supervision.  Keep all follow-up visits as told by your health care provider. This is important. Contact a health care provider if:  You keep feeling nauseous or you keep vomiting.  You feel light-headed.  You develop a rash.  You have a fever. Get help right away if:  You have trouble breathing. This information is not intended to replace advice given to you by your health care provider. Make sure you discuss any questions you have with your health care provider. Document Released: 04/22/2013 Document Revised: 12/05/2015 Document Reviewed: 10/22/2015 Elsevier Interactive Patient Education  [employer].

## 2017-05-23 NOTE — Procedures (Signed)
Interventional Radiology Procedure Note  Procedure: SP right chest tube placement for pneumothorax after lung biopsy.  Complications: None Recommendations:  - Admit to med surg floor bed - 23 hour observation - Advance diet - IV fluids - IV pain meds - IV nausea - start home meds - Chest tube on wall suction overnight - CXR in am at 6am, then waterseal the tube.  Repeat CXR in 3 hours to evaluate whether tube may be removed.  - Do not submerge - Routine chest tube care   Signed,  Dulcy Fanny. Earleen Newport, DO

## 2017-05-23 NOTE — Sedation Documentation (Signed)
Called 6N to give report. Nurse unavailable. Will call back

## 2017-05-23 NOTE — Sedation Documentation (Signed)
Pt complains of pain at chest tube site

## 2017-05-23 NOTE — Progress Notes (Signed)
Gareth Eagle P.A. notified per Dr. Earleen Newport concerning patients chest X-ray results.

## 2017-05-23 NOTE — Procedures (Signed)
Interventional Radiology Procedure Note  Procedure: CT guided biopsy of right lung mass.  cavitary Complications: None Recommendations: - Bedrest until CXR cleared.  Minimize talking, coughing or otherwise straining.  - Follow up 1 hr CXR pending  - NPO until CXR cleared  Signed,  Corrie Mckusick, DO  \

## 2017-05-24 ENCOUNTER — Observation Stay (HOSPITAL_COMMUNITY): Payer: Medicare Other

## 2017-05-24 DIAGNOSIS — I6529 Occlusion and stenosis of unspecified carotid artery: Secondary | ICD-10-CM | POA: Diagnosis not present

## 2017-05-24 DIAGNOSIS — Z7902 Long term (current) use of antithrombotics/antiplatelets: Secondary | ICD-10-CM | POA: Diagnosis not present

## 2017-05-24 DIAGNOSIS — E1140 Type 2 diabetes mellitus with diabetic neuropathy, unspecified: Secondary | ICD-10-CM | POA: Diagnosis not present

## 2017-05-24 DIAGNOSIS — I10 Essential (primary) hypertension: Secondary | ICD-10-CM | POA: Diagnosis not present

## 2017-05-24 DIAGNOSIS — E785 Hyperlipidemia, unspecified: Secondary | ICD-10-CM | POA: Diagnosis not present

## 2017-05-24 DIAGNOSIS — Z8673 Personal history of transient ischemic attack (TIA), and cerebral infarction without residual deficits: Secondary | ICD-10-CM | POA: Diagnosis not present

## 2017-05-24 DIAGNOSIS — K219 Gastro-esophageal reflux disease without esophagitis: Secondary | ICD-10-CM | POA: Diagnosis not present

## 2017-05-24 DIAGNOSIS — I700 Atherosclerosis of aorta: Secondary | ICD-10-CM | POA: Diagnosis not present

## 2017-05-24 DIAGNOSIS — C3411 Malignant neoplasm of upper lobe, right bronchus or lung: Secondary | ICD-10-CM | POA: Diagnosis not present

## 2017-05-24 DIAGNOSIS — J939 Pneumothorax, unspecified: Secondary | ICD-10-CM | POA: Diagnosis not present

## 2017-05-24 DIAGNOSIS — J95811 Postprocedural pneumothorax: Secondary | ICD-10-CM | POA: Diagnosis not present

## 2017-05-24 DIAGNOSIS — I2510 Atherosclerotic heart disease of native coronary artery without angina pectoris: Secondary | ICD-10-CM | POA: Diagnosis not present

## 2017-05-24 MED ORDER — ACETAMINOPHEN 325 MG PO TABS: 650.0000 mg | ORAL_TABLET | Freq: Four times a day (QID) | ORAL | Status: DC | PRN

## 2017-05-24 NOTE — Progress Notes (Signed)
Thoracic Location of Tumor / Histology: right upper lobe mass  Patient presented with the following results on CT scan from 02/16/17 - "Multilobulated mildly spiculated mass at the right upper lung lobe, measuring 3.3 x 2.7 x 3.2 cm. Associated cavitation noted."  Biopsies revealed:   05/23/17 Diagnosis Lung, needle/core biopsy(ies), right upper lobe - NON-SMALL CELL CARCINOMA, SEE COMMENT.  Tobacco/Marijuana/Snuff/ETOH use: current smoker.  Smoked 1 ppd for 60 years.  Currently smokes 2 cigarettes per day.  Denies ETOH or marijuana use.  Past/Anticipated interventions by cardiothoracic surgery, if any: Dr. Roxan Hockey is recommending right VATS for right upper lobectomy with node dissection vs radiation.  Past/Anticipated interventions by medical oncology, if any: possible chemotherapy with radiation.  Signs/Symptoms  Weight changes, if any: no  Respiratory complaints, if any: has occasional dry cough  Hemoptysis, if any: no  Pain issues, if any:  no  SAFETY ISSUES:  Prior radiation? no  Pacemaker/ICD? no   Possible current pregnancy?no  Is the patient on methotrexate? no  Current Complaints / other details: Will see Dr. Roxan Hockey today.  She is here with her husband and daughter. She said she had a chest tube after her biopsy.  She has bruising on her right chest and also has a small incision on her right side.  BP (!) 152/62 (BP Location: Right Arm, Patient Position: Sitting)   Pulse 76   Temp 98.2 F (36.8 C) (Oral)   Ht 5\' 6"  (1.676 m)   Wt 143 lb 3.2 oz (65 kg)   SpO2 100%   BMI 23.11 kg/m    Wt Readings from Last 3 Encounters:  05/28/17 143 lb 3.2 oz (65 kg)  05/23/17 144 lb (65.3 kg)  05/13/17 144 lb (65.3 kg)

## 2017-05-24 NOTE — Discharge Summary (Signed)
Patient ID: [name] MRN: [id number] DOB/AGE: [birth year] 76 y.o.  Admit date: 05/23/2017 Discharge date: 05/24/2017  Supervising Physician: Corrie Mckusick  Patient Status: [facility] - In-pt  Admission Diagnoses: Pneumothorax after lung biopsy  Discharge Diagnoses:  Active Problems:   Pneumothorax after biopsy   Discharged Condition: good  Hospital Course:  [name] is a 76 y.o. female with a history of tobacco use (60-pack-years),hypertension, atherosclerotic cardiovascular disease, stroke, and type 2 diabetes.  She presented to radiology department yesterday for lung mass biopsy. Patient underwent successful procedure, however developed pneumothorax which remained stable but unimproving yesterday as demonstrated by serial imaging. Patient had right chest tube placed and was admitted overnight for observation. Chest tube was on suction overnight.  PA to bedside this AM.  Patient feeling well with soreness at drain site and intermittent nausea.  CXR this morning showed resolve of pneumothorax.  Her chest tube was placed to water seal which she tolerated well for several hours.  Chest tube was removed and patient monitored post-removal for shortness of breath.  She has been able to ambulate in her room.  She has eaten breakfast and lunch.  She exhibits no shortness of breath or increased work of breathing. She is stable for discharge home.  She is given instructions for caring for drain site at home. She verbalizes understanding.   Discharge Exam: Blood pressure 135/75, pulse 72, temperature 98.1 F (36.7 C), temperature source Oral, resp. rate 18, height '5\' 6"'  (1.676 m), weight 144 lb (65.3 kg), SpO2 98 %. General appearance: alert and no distress Resp: clear to auscultation bilaterally Cardio: regular rate and rhythm, S1, S2 normal, no murmur, click, rub or gallop GI: soft, non-tender; bowel sounds normal; no masses,  no organomegaly Skin: Skin color, texture, turgor normal. No  rashes or lesions or erythema.  Chest tube site covered with dressing.   Disposition: 01-Home or Self Care  Discharge Instructions    Call MD for:  persistant nausea and vomiting   Complete by:  As directed    Call MD for:  redness, tenderness, or signs of infection (pain, swelling, redness, odor or green/yellow discharge around incision site)   Complete by:  As directed    Call MD for:  severe uncontrolled pain   Complete by:  As directed    Call MD for:  temperature >100.4   Complete by:  As directed    Diet - low sodium heart healthy   Complete by:  As directed    Discharge instructions   Complete by:  As directed    Leave dressing in place for 24-48 hrs.  Do no submerge.   Replace dressing after 24-48 hrs.  Keep puncture site clean and dry until well healed.   Increase activity slowly   Complete by:  As directed      Allergies as of 05/24/2017      Reactions   Codeine Nausea And Vomiting   Lipitor [atorvastatin] Nausea And Vomiting   Phenergan [promethazine Hcl] Other (See Comments)   Confusion, hallucinations, severe agitation      Medication List    TAKE these medications   ACCU-CHEK AVIVA PLUS test strip Generic drug:  glucose blood CHECK BLOOD SUGAR TWO TIMES DAILY   ACCU-CHEK AVIVA PLUS w/Device Kit 1 kit by Does not apply route 2 (two) times daily.   ACCU-CHEK SOFTCLIX LANCETS lancets Patient is to test two times a day DX E11.9   clopidogrel 75 MG tablet Commonly known as:  PLAVIX TAKE 1 TABLET BY MOUTH  DAILY What changed:    how much to take  how to take this  when to take this   JANUMET 50-1000 MG tablet Generic drug:  sitaGLIPtin-metformin take 1 tablet by mouth twice a day with food   lisinopril 20 MG tablet Commonly known as:  PRINIVIL,ZESTRIL Take 20 mg by mouth daily.   rosuvastatin 20 MG tablet Commonly known as:  CRESTOR take 1 tablet by mouth once daily         Electronically Signed: Docia Barrier, PA 05/24/2017,  1:30 PM   I have spent Greater Than 30 Minutes discharging [name].

## 2017-05-24 NOTE — Progress Notes (Signed)
Discharge home. Home discharge instruction given, no question verbalized. 

## 2017-05-27 ENCOUNTER — Ambulatory Visit: Payer: Medicare Other

## 2017-05-27 ENCOUNTER — Ambulatory Visit: Payer: Medicare Other | Admitting: Radiation Oncology

## 2017-05-28 ENCOUNTER — Other Ambulatory Visit: Payer: Self-pay

## 2017-05-28 ENCOUNTER — Ambulatory Visit
Admission: RE | Admit: 2017-05-28 | Discharge: 2017-05-28 | Disposition: A | Discharge status: Home / Self Care | Payer: Medicare Other | Source: Ambulatory Visit | Attending: Radiation Oncology | Admitting: Radiation Oncology

## 2017-05-28 ENCOUNTER — Encounter: Payer: Medicare Other | Admitting: Thoracic Surgery (Cardiothoracic Vascular Surgery)

## 2017-05-28 ENCOUNTER — Encounter: Payer: Self-pay | Admitting: Radiation Oncology

## 2017-05-28 ENCOUNTER — Telehealth: Payer: Self-pay | Admitting: Oncology

## 2017-05-28 VITALS — BP 152/62 | HR 76 | Temp 98.2°F | Ht 66.0 in | Wt 143.2 lb

## 2017-05-28 DIAGNOSIS — Z7982 Long term (current) use of aspirin: Secondary | ICD-10-CM | POA: Diagnosis not present

## 2017-05-28 DIAGNOSIS — Z7984 Long term (current) use of oral hypoglycemic drugs: Secondary | ICD-10-CM | POA: Diagnosis not present

## 2017-05-28 DIAGNOSIS — Z8673 Personal history of transient ischemic attack (TIA), and cerebral infarction without residual deficits: Secondary | ICD-10-CM | POA: Diagnosis not present

## 2017-05-28 DIAGNOSIS — Z7902 Long term (current) use of antithrombotics/antiplatelets: Secondary | ICD-10-CM | POA: Diagnosis not present

## 2017-05-28 DIAGNOSIS — Z833 Family history of diabetes mellitus: Secondary | ICD-10-CM | POA: Diagnosis not present

## 2017-05-28 DIAGNOSIS — C3491 Malignant neoplasm of unspecified part of right bronchus or lung: Secondary | ICD-10-CM

## 2017-05-28 DIAGNOSIS — C3490 Malignant neoplasm of unspecified part of unspecified bronchus or lung: Secondary | ICD-10-CM | POA: Insufficient documentation

## 2017-05-28 DIAGNOSIS — C3411 Malignant neoplasm of upper lobe, right bronchus or lung: Secondary | ICD-10-CM | POA: Diagnosis not present

## 2017-05-28 DIAGNOSIS — E785 Hyperlipidemia, unspecified: Secondary | ICD-10-CM | POA: Diagnosis not present

## 2017-05-28 DIAGNOSIS — Z885 Allergy status to narcotic agent status: Secondary | ICD-10-CM | POA: Diagnosis not present

## 2017-05-28 DIAGNOSIS — Z806 Family history of leukemia: Secondary | ICD-10-CM | POA: Diagnosis not present

## 2017-05-28 DIAGNOSIS — K219 Gastro-esophageal reflux disease without esophagitis: Secondary | ICD-10-CM | POA: Diagnosis not present

## 2017-05-28 DIAGNOSIS — Z79899 Other long term (current) drug therapy: Secondary | ICD-10-CM | POA: Diagnosis not present

## 2017-05-28 DIAGNOSIS — R918 Other nonspecific abnormal finding of lung field: Secondary | ICD-10-CM

## 2017-05-28 DIAGNOSIS — I10 Essential (primary) hypertension: Secondary | ICD-10-CM | POA: Diagnosis not present

## 2017-05-28 DIAGNOSIS — Z888 Allergy status to other drugs, medicaments and biological substances status: Secondary | ICD-10-CM | POA: Diagnosis not present

## 2017-05-28 DIAGNOSIS — Z510 Encounter for antineoplastic radiation therapy: Secondary | ICD-10-CM | POA: Diagnosis not present

## 2017-05-28 DIAGNOSIS — Z9049 Acquired absence of other specified parts of digestive tract: Secondary | ICD-10-CM | POA: Diagnosis not present

## 2017-05-28 DIAGNOSIS — Z8249 Family history of ischemic heart disease and other diseases of the circulatory system: Secondary | ICD-10-CM | POA: Diagnosis not present

## 2017-05-28 DIAGNOSIS — E1140 Type 2 diabetes mellitus with diabetic neuropathy, unspecified: Secondary | ICD-10-CM | POA: Diagnosis not present

## 2017-05-28 NOTE — Telephone Encounter (Signed)
Called Dr. Leonarda Salon office and canceled her appointment today as she has decided to have radiation treatment.

## 2017-05-28 NOTE — Progress Notes (Signed)
Radiation Oncology         [phone] ________________________________  Name: [name] MRN: [id number]  Date: 05/28/2017  DOB: 1941/05/29  Follow-Up Visit Note  CC: Nichole Lung, MD  [name], *    ICD-10-CM   1. Non-small cell cancer of right Walker [facility]) C34.91   2. 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 Since her initial consultation the patient did proceed to undergo biopsy of the right upper lobe mass late last week. This is complicated by pneumothorax requiring overnight admission to the hospital. Patient is been doing well this discharge. She has some mild discomfort with bruising along the right anterior chest from her biopsy but no breathing problems or chest pain.                             ALLERGIES:  is allergic to codeine; lipitor [atorvastatin]; and phenergan [promethazine hcl].  Meds: Current Outpatient Medications  Medication Sig Dispense Refill  . clopidogrel (PLAVIX) 75 MG tablet TAKE 1 TABLET BY MOUTH  DAILY (Patient taking differently: Take 75 mg by mouth once a day) 90 tablet 1  . JANUMET 50-1000 MG tablet take 1 tablet by mouth twice a day with food 180 tablet 0  . lisinopril (PRINIVIL,ZESTRIL) 20 MG tablet Take 20 mg by mouth daily.     . rosuvastatin (CRESTOR) 20 MG tablet take 1 tablet by mouth once daily 90 tablet 3  . ACCU-CHEK AVIVA PLUS test strip CHECK BLOOD SUGAR TWO TIMES DAILY (Patient not taking: Reported on 05/28/2017) 200 each 3  . ACCU-CHEK SOFTCLIX LANCETS lancets Patient is to test two times a day DX E11.9 (Patient not taking: Reported on 05/28/2017) 200 each 3  . Blood Glucose Monitoring Suppl (ACCU-CHEK AVIVA PLUS) w/Device KIT 1 kit by Does not apply route 2 (two) times daily. (Patient not taking: Reported on 05/28/2017) 1 kit 0   No  current facility-administered medications for this encounter.     Physical Findings: The patient is in no acute distress. Patient is alert and oriented.  height is _0  (1.676 m) and weight is 143 lb 3.2 oz (65 kg). Her oral temperature is 98.2 F (36.8 C). Her blood pressure is 152/62 (abnormal) and her pulse is 76. Her oxygen saturation is 100%. . Lungs are clear to auscultation bilaterally. Heart has regular rate and rhythm. No palpable cervical, supraclavicular, or axillary adenopathy. Abdomen soft, non-tender, normal bowel sounds. Some bruising noted along the right upper anterior chest region. No signs of infection.  Lab Findings: Lab Results  Component Value Date   WBC 13.4 (H) 05/23/2017   HGB 11.3 (L) 05/23/2017   HCT 34.9 (L) 05/23/2017   MCV 89.5 05/23/2017   PLT 497 (H) 05/23/2017    Radiographic Findings: X-ray Chest Pa Or Ap  Addendum Date: 05/23/2017   ADDENDUM REPORT: 05/23/2017 16:21 ADDENDUM: Critical Value/emergent results were called by IM at the time of interpretation on 05/23/2017 at 4:20 pm to Dr. Corrie Mckusick , who verbally acknowledged these results. Electronically Signed   By: Franki Cabot M.D.   On: 05/23/2017 16:21   Result Date: 05/23/2017 CLINICAL DATA:  Pneumothorax. EXAM: CHEST 1 VIEW COMPARISON:  Chest x-ray from earlier same day. FINDINGS:  Right-sided pneumothorax is stable, or perhaps slightly larger, compared to the earlier chest x-ray. Mediastinal structures are stable in position and configuration. Left Walker remains clear. No pleural effusion. IMPRESSION: Right-sided pneumothorax is stable, or perhaps slightly larger, compared to the earlier chest x-ray. Electronically Signed: By: Franki Cabot M.D. On: 05/23/2017 16:15   Ct Biopsy  Result Date: 05/23/2017 INDICATION: 76 year old female with a history of cavitary right upper lobe Walker mass EXAM: CT BIOPSY MEDICATIONS: None. ANESTHESIA/SEDATION: Moderate (conscious) sedation was employed during this  procedure. A total of Versed 2.0 mg and Fentanyl 50 mcg was administered intravenously. Moderate Sedation Time: 20 minutes. The patient's level of consciousness and vital signs were monitored continuously by radiology nursing throughout the procedure under my direct supervision. FLUOROSCOPY TIME:  CT COMPLICATIONS: SIR Level A - No therapy, no consequence. PROCEDURE: The procedure, risks, benefits, and alternatives were explained to the patient and the patient's family. Specific risks that were addressed included bleeding, infection, pneumothorax, need for further procedure including chest tube placement, chance of delayed pneumothorax or hemorrhage, hemoptysis, nondiagnostic sample, cardiopulmonary collapse, death. Questions regarding the procedure were encouraged and answered. The patient understands and consents to the procedure. Patient was positioned in the supine position on the CT gantry table and a scout CT of the chest was performed for planning purposes. Once angle of approach was determined, the skin and subcutaneous tissues this scan was prepped and draped in the usual sterile fashion, and a sterile drape was applied covering the operative field. A sterile gown and sterile gloves were used for the procedure. Local anesthesia was provided with 1% Lidocaine. The skin and subcutaneous tissues were infiltrated 1% lidocaine for local anesthesia, and a small stab incision was made with an 11 blade scalpel. Using CT guidance, a 17 gauge trocar needle was advanced into the margin of the right upper lobe cavitary mass. After confirmation of the tip, separate 18 gauge core biopsies were performed. These were placed into solution for transportation to the lab. Biosentry Device was deployed. A final CT image was performed. Patient tolerated the procedure well and remained hemodynamically stable throughout. No complications were encountered and no significant blood loss was encounter IMPRESSION: Status post CT-guided  biopsy of cavitary lesion of the right upper lobe. Tissue specimen sent to pathology for complete histopathologic analysis. Signed, Dulcy Fanny. Earleen Newport, DO [facility] Electronically Signed   By: Corrie Mckusick D.O.   On: 05/23/2017 12:43   Dg Chest Port 1 View  Result Date: 05/24/2017 CLINICAL DATA:  Follow-up right pneumothorax. EXAM: PORTABLE CHEST 1 VIEW COMPARISON:  05/23/2017 FINDINGS: Cardiac shadow is stable. Left Walker remains clear. Cavitary lesion is again identified on the right stable from the previous exams. Chest tube is noted in place. Previously seen right-sided pneumothorax has resolved in the interval. No other focal abnormality is noted. IMPRESSION: Resolution of previously seen right-sided pneumothorax. Electronically Signed   By: Inez Catalina M.D.   On: 05/24/2017 08:16   Dg Chest Port 1 View  Result Date: 05/23/2017 CLINICAL DATA:  Pneumothorax after biopsy. EXAM: PORTABLE CHEST 1 VIEW COMPARISON:  Chest x-ray dated 07/25/2013. FINDINGS: Right-sided pneumothorax, increased compared to the CT biopsy images from earlier today, moderate to large in size with approximately 2.8 cm pleural line displacement. Questionable mild leftward shift of the mediastinum raising the possibility of tension pneumothorax. Cavitary lesion within the right upper lobe, presumably the site of today's CT-guided biopsy. Left Walker is clear. No pleural effusion. Heart  size and mediastinal contours are stable. Atherosclerotic changes noted at the aortic arch. IMPRESSION: 1. Right-sided pneumothorax, increased in size compared to the images from CT biopsy performed earlier today, now moderate to large in size. Questionable mild leftward shift of the mediastinum raising the possibility of tension pneumothorax. 2. Cavitary mass/consolidation within the right upper Walker, presumably the target of today's CT-guided biopsy. 3. Left Walker is clear. 4. Aortic  atherosclerosis. Electronically Signed   By: Franki Cabot M.D.   On: 05/23/2017 14:07   Ir Perc Pleural Drain W/indwell Cath W/img Guide  Result Date: 05/23/2017 INDICATION: 76 year old female with a history of right pneumothorax after biopsy EXAM: IR PERC PLEURAL DRAIN W/INDWELL CATH W/IMG GUIDE MEDICATIONS: The patient is currently admitted to the hospital and receiving intravenous antibiotics. The antibiotics were administered within an appropriate time frame prior to the initiation of the procedure. ANESTHESIA/SEDATION: Fentanyl 50 mcg IV; Versed 0 mg IV COMPLICATIONS: None PROCEDURE: Informed written consent was obtained from the patient after a thorough discussion of the procedural risks, benefits and alternatives. All questions were addressed. Maximal Sterile Barrier Technique was utilized including caps, mask, sterile gowns, sterile gloves, sterile drape, hand hygiene and skin antiseptic. A timeout was performed prior to the initiation of the procedure. Patient position in the right anterior oblique position. Right chest wall was prepped and draped in the usual sterile fashion. The skin and subcutaneous tissues were generously infiltrated 1% lidocaine for local anesthesia. Using modified Seldinger technique, a 10 French chest tube was placed. Once the chest tube was in position, the catheter was attached to suction, the patient is positioned supine, in a final image was stored to confirm re-expansion of the Walker. Catheter was sutured in position and a sterile dressing was placed. Patient tolerated the procedure well and remained hemodynamically stable throughout. No complications were encountered and no significant blood loss. IMPRESSION: Status post CT-guided placement of right-sided thoracostomy tube. Signed, Dulcy Fanny. Earleen Newport, DO [facility] Electronically Signed   By: Corrie Mckusick D.O.   On: 05/23/2017 18:35    Impression:    Stage IIA  (cT2b, cN0, cM0) non-small cell Walker cancer (squamous cell carcinoma) presenting in the right upper lobe Walker.  Today I discussed the options for management with the patient including surgery and definitive radiation therapy. Given the patient's age and other medical issues she has decided to proceed with a definitive course of radiation therapy, with the understanding the potential cure rate is less than with surgery. Patient and family feel comfortable with her decision. We discussed the course of treatment side effects and potential long-term toxicities of high-dose radiation therapy in this situation. The patient appears to understand and wishes that we proceed with planning and treatment.   Plan:  Patient is scheduled for SBRT simulation and planning on November 15 at 2 PM. She will either receive stereotactic body radiation therapy (SBRT) treatments or hypofractionated accelerated radiation treatments depending on the overall plan.  ____________________________________ Gery Pray, MD

## 2017-05-30 ENCOUNTER — Ambulatory Visit
Admission: RE | Admit: 2017-05-30 | Discharge: 2017-05-30 | Disposition: A | Discharge status: Home / Self Care | Payer: Medicare Other | Source: Ambulatory Visit | Attending: Radiation Oncology | Admitting: Radiation Oncology

## 2017-05-30 DIAGNOSIS — Z79899 Other long term (current) drug therapy: Secondary | ICD-10-CM | POA: Diagnosis not present

## 2017-05-30 DIAGNOSIS — Z8249 Family history of ischemic heart disease and other diseases of the circulatory system: Secondary | ICD-10-CM | POA: Diagnosis not present

## 2017-05-30 DIAGNOSIS — Z7984 Long term (current) use of oral hypoglycemic drugs: Secondary | ICD-10-CM | POA: Diagnosis not present

## 2017-05-30 DIAGNOSIS — K219 Gastro-esophageal reflux disease without esophagitis: Secondary | ICD-10-CM | POA: Diagnosis not present

## 2017-05-30 DIAGNOSIS — E1140 Type 2 diabetes mellitus with diabetic neuropathy, unspecified: Secondary | ICD-10-CM | POA: Diagnosis not present

## 2017-05-30 DIAGNOSIS — Z885 Allergy status to narcotic agent status: Secondary | ICD-10-CM | POA: Diagnosis not present

## 2017-05-30 DIAGNOSIS — C3491 Malignant neoplasm of unspecified part of right bronchus or lung: Secondary | ICD-10-CM

## 2017-05-30 DIAGNOSIS — Z7982 Long term (current) use of aspirin: Secondary | ICD-10-CM | POA: Diagnosis not present

## 2017-05-30 DIAGNOSIS — C3411 Malignant neoplasm of upper lobe, right bronchus or lung: Secondary | ICD-10-CM | POA: Diagnosis not present

## 2017-05-30 DIAGNOSIS — Z888 Allergy status to other drugs, medicaments and biological substances status: Secondary | ICD-10-CM | POA: Diagnosis not present

## 2017-05-30 DIAGNOSIS — Z9049 Acquired absence of other specified parts of digestive tract: Secondary | ICD-10-CM | POA: Diagnosis not present

## 2017-05-30 DIAGNOSIS — Z510 Encounter for antineoplastic radiation therapy: Secondary | ICD-10-CM | POA: Diagnosis not present

## 2017-05-30 DIAGNOSIS — I10 Essential (primary) hypertension: Secondary | ICD-10-CM | POA: Diagnosis not present

## 2017-05-30 DIAGNOSIS — Z806 Family history of leukemia: Secondary | ICD-10-CM | POA: Diagnosis not present

## 2017-05-30 DIAGNOSIS — Z7902 Long term (current) use of antithrombotics/antiplatelets: Secondary | ICD-10-CM | POA: Diagnosis not present

## 2017-05-30 DIAGNOSIS — E785 Hyperlipidemia, unspecified: Secondary | ICD-10-CM | POA: Diagnosis not present

## 2017-05-30 DIAGNOSIS — Z8673 Personal history of transient ischemic attack (TIA), and cerebral infarction without residual deficits: Secondary | ICD-10-CM | POA: Diagnosis not present

## 2017-05-30 DIAGNOSIS — Z833 Family history of diabetes mellitus: Secondary | ICD-10-CM | POA: Diagnosis not present

## 2017-05-30 NOTE — Progress Notes (Signed)
  Radiation Oncology         [phone] ________________________________  Name: [name] MRN: [id number]  Date: 05/30/2017  DOB: 03/08/1941   STEREOTACTIC BODY RADIOTHERAPY SIMULATION AND TREATMENT PLANNING NOTE    DIAGNOSIS:  Stage IIA (cT2b, cN0, cM0) non-small cell lung cancer  (squamous cell carcinoma) presenting in the right upper lobe lung.  NARRATIVE:  The patient was brought to the [facility].  Identity was confirmed.  All relevant records and images related to the planned course of therapy were reviewed.  The patient freely provided informed written consent to proceed with treatment after reviewing the details related to the planned course of therapy. The consent form was witnessed and verified by the simulation staff.  Then, the patient was set-up in a stable reproducible  supine position for radiation therapy.  A BodyFix immobilization pillow was fabricated for reproducible positioning.  Then I personally applied the abdominal compression paddle to limit respiratory excursion.  4D respiratoy motion management CT images were obtained.  Surface markings were placed.  The CT images were loaded into the planning software.  Then, using Cine, MIP, and standard views, the internal target volume (ITV) and planning target volumes (PTV) were delinieated, and avoidance structures were contoured.  Treatment planning then occurred.  The radiation prescription was entered and confirmed.  A total of two complex treatment devices were fabricated in the form of the BodyFix immobilization pillow and a neck accuform cushion.  I have requested : 3D Simulation  I have requested a DVH of the following structures: Heart, Lungs, Esophagus, Chest Wall, Brachial Plexus, Major Blood Vessels, and targets.  PLAN:  The patient will receive 54 Gy in 3 fractions.  -----------------------------------  Blair Promise, PhD, MD This document serves as a record of services personally performed  by Gery Pray, MD. It was created on his behalf by Valeta Harms, a trained medical scribe. The creation of this record is based on the scribe's personal observations and the provider's statements to them. This document has been checked and approved by the attending provider.

## 2017-06-04 DIAGNOSIS — Z7982 Long term (current) use of aspirin: Secondary | ICD-10-CM | POA: Diagnosis not present

## 2017-06-04 DIAGNOSIS — K219 Gastro-esophageal reflux disease without esophagitis: Secondary | ICD-10-CM | POA: Diagnosis not present

## 2017-06-04 DIAGNOSIS — I10 Essential (primary) hypertension: Secondary | ICD-10-CM | POA: Diagnosis not present

## 2017-06-04 DIAGNOSIS — Z7984 Long term (current) use of oral hypoglycemic drugs: Secondary | ICD-10-CM | POA: Diagnosis not present

## 2017-06-04 DIAGNOSIS — E1140 Type 2 diabetes mellitus with diabetic neuropathy, unspecified: Secondary | ICD-10-CM | POA: Diagnosis not present

## 2017-06-04 DIAGNOSIS — Z510 Encounter for antineoplastic radiation therapy: Secondary | ICD-10-CM | POA: Diagnosis not present

## 2017-06-04 DIAGNOSIS — Z8249 Family history of ischemic heart disease and other diseases of the circulatory system: Secondary | ICD-10-CM | POA: Diagnosis not present

## 2017-06-04 DIAGNOSIS — Z806 Family history of leukemia: Secondary | ICD-10-CM | POA: Diagnosis not present

## 2017-06-04 DIAGNOSIS — Z8673 Personal history of transient ischemic attack (TIA), and cerebral infarction without residual deficits: Secondary | ICD-10-CM | POA: Diagnosis not present

## 2017-06-04 DIAGNOSIS — Z7902 Long term (current) use of antithrombotics/antiplatelets: Secondary | ICD-10-CM | POA: Diagnosis not present

## 2017-06-04 DIAGNOSIS — E785 Hyperlipidemia, unspecified: Secondary | ICD-10-CM | POA: Diagnosis not present

## 2017-06-04 DIAGNOSIS — C3411 Malignant neoplasm of upper lobe, right bronchus or lung: Secondary | ICD-10-CM | POA: Diagnosis not present

## 2017-06-04 DIAGNOSIS — Z885 Allergy status to narcotic agent status: Secondary | ICD-10-CM | POA: Diagnosis not present

## 2017-06-04 DIAGNOSIS — Z833 Family history of diabetes mellitus: Secondary | ICD-10-CM | POA: Diagnosis not present

## 2017-06-04 DIAGNOSIS — Z888 Allergy status to other drugs, medicaments and biological substances status: Secondary | ICD-10-CM | POA: Diagnosis not present

## 2017-06-04 DIAGNOSIS — Z79899 Other long term (current) drug therapy: Secondary | ICD-10-CM | POA: Diagnosis not present

## 2017-06-04 DIAGNOSIS — Z9049 Acquired absence of other specified parts of digestive tract: Secondary | ICD-10-CM | POA: Diagnosis not present

## 2017-06-11 ENCOUNTER — Ambulatory Visit
Admission: RE | Admit: 2017-06-11 | Discharge: 2017-06-11 | Disposition: A | Discharge status: Home / Self Care | Payer: Medicare Other | Source: Ambulatory Visit | Attending: Radiation Oncology | Admitting: Radiation Oncology

## 2017-06-11 DIAGNOSIS — Z806 Family history of leukemia: Secondary | ICD-10-CM | POA: Diagnosis not present

## 2017-06-11 DIAGNOSIS — Z7984 Long term (current) use of oral hypoglycemic drugs: Secondary | ICD-10-CM | POA: Diagnosis not present

## 2017-06-11 DIAGNOSIS — Z9049 Acquired absence of other specified parts of digestive tract: Secondary | ICD-10-CM | POA: Diagnosis not present

## 2017-06-11 DIAGNOSIS — I10 Essential (primary) hypertension: Secondary | ICD-10-CM | POA: Diagnosis not present

## 2017-06-11 DIAGNOSIS — E785 Hyperlipidemia, unspecified: Secondary | ICD-10-CM | POA: Diagnosis not present

## 2017-06-11 DIAGNOSIS — Z8249 Family history of ischemic heart disease and other diseases of the circulatory system: Secondary | ICD-10-CM | POA: Diagnosis not present

## 2017-06-11 DIAGNOSIS — Z888 Allergy status to other drugs, medicaments and biological substances status: Secondary | ICD-10-CM | POA: Diagnosis not present

## 2017-06-11 DIAGNOSIS — K219 Gastro-esophageal reflux disease without esophagitis: Secondary | ICD-10-CM | POA: Diagnosis not present

## 2017-06-11 DIAGNOSIS — E1140 Type 2 diabetes mellitus with diabetic neuropathy, unspecified: Secondary | ICD-10-CM | POA: Diagnosis not present

## 2017-06-11 DIAGNOSIS — Z7902 Long term (current) use of antithrombotics/antiplatelets: Secondary | ICD-10-CM | POA: Diagnosis not present

## 2017-06-11 DIAGNOSIS — Z510 Encounter for antineoplastic radiation therapy: Secondary | ICD-10-CM | POA: Diagnosis not present

## 2017-06-11 DIAGNOSIS — C3411 Malignant neoplasm of upper lobe, right bronchus or lung: Secondary | ICD-10-CM | POA: Diagnosis not present

## 2017-06-11 DIAGNOSIS — Z885 Allergy status to narcotic agent status: Secondary | ICD-10-CM | POA: Diagnosis not present

## 2017-06-11 DIAGNOSIS — Z833 Family history of diabetes mellitus: Secondary | ICD-10-CM | POA: Diagnosis not present

## 2017-06-11 DIAGNOSIS — R918 Other nonspecific abnormal finding of lung field: Secondary | ICD-10-CM

## 2017-06-11 DIAGNOSIS — Z7982 Long term (current) use of aspirin: Secondary | ICD-10-CM | POA: Diagnosis not present

## 2017-06-11 DIAGNOSIS — Z8673 Personal history of transient ischemic attack (TIA), and cerebral infarction without residual deficits: Secondary | ICD-10-CM | POA: Diagnosis not present

## 2017-06-11 DIAGNOSIS — Z79899 Other long term (current) drug therapy: Secondary | ICD-10-CM | POA: Diagnosis not present

## 2017-06-11 NOTE — Progress Notes (Signed)
  Radiation Oncology         [phone] ________________________________  Name: [name] MRN: [id number]  Date: 06/11/2017  DOB: 10-26-1940  Stereotactic Body Radiotherapy Treatment Procedure Note  NARRATIVE:  [name] was brought to the stereotactic radiation treatment machine and placed supine on the CT couch. The patient was set up for stereotactic body radiotherapy on the body fix pillow.  3D TREATMENT PLANNING AND DOSIMETRY:  The patient's radiation plan was reviewed and approved prior to starting treatment.  It showed 3-dimensional radiation distributions overlaid onto the planning CT.  The [facility] for the target structures as well as the organs at risk were reviewed. The documentation of this is filed in the radiation oncology EMR.  SIMULATION VERIFICATION:  The patient underwent CT imaging on the treatment unit.  These were carefully aligned to document that the ablative radiation dose would cover the target volume and maximally spare the nearby organs at risk according to the planned distribution.  SPECIAL TREATMENT PROCEDURE: [name] received high dose ablative stereotactic body radiotherapy to the planned target volume without unforeseen complications. Treatment was delivered uneventfully. The high doses associated with stereotactic body radiotherapy and the significant potential risks require careful treatment set up and patient monitoring constituting a special treatment procedure   STEREOTACTIC TREATMENT MANAGEMENT:  Following delivery, the patient was evaluated clinically. The patient tolerated treatment without significant acute effects, and was discharged to home in stable condition.    PLAN: Continue treatment as planned.  ________________________________  Blair Promise, PhD, MD

## 2017-06-13 ENCOUNTER — Ambulatory Visit
Admission: RE | Admit: 2017-06-13 | Discharge: 2017-06-13 | Disposition: A | Discharge status: Home / Self Care | Payer: Medicare Other | Source: Ambulatory Visit | Attending: Radiation Oncology | Admitting: Radiation Oncology

## 2017-06-13 DIAGNOSIS — Z885 Allergy status to narcotic agent status: Secondary | ICD-10-CM | POA: Diagnosis not present

## 2017-06-13 DIAGNOSIS — E785 Hyperlipidemia, unspecified: Secondary | ICD-10-CM | POA: Diagnosis not present

## 2017-06-13 DIAGNOSIS — C3491 Malignant neoplasm of unspecified part of right bronchus or lung: Secondary | ICD-10-CM

## 2017-06-13 DIAGNOSIS — Z888 Allergy status to other drugs, medicaments and biological substances status: Secondary | ICD-10-CM | POA: Diagnosis not present

## 2017-06-13 DIAGNOSIS — E1140 Type 2 diabetes mellitus with diabetic neuropathy, unspecified: Secondary | ICD-10-CM | POA: Diagnosis not present

## 2017-06-13 DIAGNOSIS — Z7982 Long term (current) use of aspirin: Secondary | ICD-10-CM | POA: Diagnosis not present

## 2017-06-13 DIAGNOSIS — Z7902 Long term (current) use of antithrombotics/antiplatelets: Secondary | ICD-10-CM | POA: Diagnosis not present

## 2017-06-13 DIAGNOSIS — Z9049 Acquired absence of other specified parts of digestive tract: Secondary | ICD-10-CM | POA: Diagnosis not present

## 2017-06-13 DIAGNOSIS — K219 Gastro-esophageal reflux disease without esophagitis: Secondary | ICD-10-CM | POA: Diagnosis not present

## 2017-06-13 DIAGNOSIS — Z806 Family history of leukemia: Secondary | ICD-10-CM | POA: Diagnosis not present

## 2017-06-13 DIAGNOSIS — C3411 Malignant neoplasm of upper lobe, right bronchus or lung: Secondary | ICD-10-CM | POA: Diagnosis not present

## 2017-06-13 DIAGNOSIS — I10 Essential (primary) hypertension: Secondary | ICD-10-CM | POA: Diagnosis not present

## 2017-06-13 DIAGNOSIS — Z510 Encounter for antineoplastic radiation therapy: Secondary | ICD-10-CM | POA: Diagnosis not present

## 2017-06-13 DIAGNOSIS — Z8673 Personal history of transient ischemic attack (TIA), and cerebral infarction without residual deficits: Secondary | ICD-10-CM | POA: Diagnosis not present

## 2017-06-13 DIAGNOSIS — Z833 Family history of diabetes mellitus: Secondary | ICD-10-CM | POA: Diagnosis not present

## 2017-06-13 DIAGNOSIS — Z79899 Other long term (current) drug therapy: Secondary | ICD-10-CM | POA: Diagnosis not present

## 2017-06-13 DIAGNOSIS — Z7984 Long term (current) use of oral hypoglycemic drugs: Secondary | ICD-10-CM | POA: Diagnosis not present

## 2017-06-13 DIAGNOSIS — Z8249 Family history of ischemic heart disease and other diseases of the circulatory system: Secondary | ICD-10-CM | POA: Diagnosis not present

## 2017-06-13 NOTE — Progress Notes (Signed)
  Radiation Oncology         [phone] ________________________________  Name: [name] MRN: [id number]  Date: 06/13/2017  DOB: [birth year]  Stereotactic Body Radiotherapy Treatment Procedure Note  NARRATIVE:  [name] was brought to the stereotactic radiation treatment machine and placed supine on the CT couch. The patient was set up for stereotactic body radiotherapy on the body fix pillow.  3D TREATMENT PLANNING AND DOSIMETRY:  The patient's radiation plan was reviewed and approved prior to starting treatment.  It showed 3-dimensional radiation distributions overlaid onto the planning CT.  The [facility] for the target structures as well as the organs at risk were reviewed. The documentation of this is filed in the radiation oncology EMR.  SIMULATION VERIFICATION:  The patient underwent CT imaging on the treatment unit.  These were carefully aligned to document that the ablative radiation dose would cover the target volume and maximally spare the nearby organs at risk according to the planned distribution.  SPECIAL TREATMENT PROCEDURE: [name] received high dose ablative stereotactic body radiotherapy to the planned target volume without unforeseen complications. Treatment was delivered uneventfully. The high doses associated with stereotactic body radiotherapy and the significant potential risks require careful treatment set up and patient monitoring constituting a special treatment procedure   STEREOTACTIC TREATMENT MANAGEMENT:  Following delivery, the patient was evaluated clinically. The patient tolerated treatment without significant acute effects, and was discharged to home in stable condition.    PLAN: Continue treatment as planned.  ________________________________  Blair Promise, PhD, MD  This document serves as a record of services personally performed by Gery Pray, MD. It was created on his behalf by Valeta Harms, a trained medical scribe. The creation  of this record is based on the scribe's personal observations and the provider's statements to them. This document has been checked and approved by the attending provider.

## 2017-06-17 ENCOUNTER — Ambulatory Visit
Admission: RE | Admit: 2017-06-17 | Discharge: 2017-06-17 | Disposition: A | Discharge status: Home / Self Care | Payer: Medicare Other | Source: Ambulatory Visit | Attending: Radiation Oncology | Admitting: Radiation Oncology

## 2017-06-17 DIAGNOSIS — K219 Gastro-esophageal reflux disease without esophagitis: Secondary | ICD-10-CM | POA: Diagnosis not present

## 2017-06-17 DIAGNOSIS — Z79899 Other long term (current) drug therapy: Secondary | ICD-10-CM | POA: Diagnosis not present

## 2017-06-17 DIAGNOSIS — Z888 Allergy status to other drugs, medicaments and biological substances status: Secondary | ICD-10-CM | POA: Diagnosis not present

## 2017-06-17 DIAGNOSIS — Z7982 Long term (current) use of aspirin: Secondary | ICD-10-CM | POA: Diagnosis not present

## 2017-06-17 DIAGNOSIS — C3411 Malignant neoplasm of upper lobe, right bronchus or lung: Secondary | ICD-10-CM | POA: Diagnosis not present

## 2017-06-17 DIAGNOSIS — C3491 Malignant neoplasm of unspecified part of right bronchus or lung: Secondary | ICD-10-CM

## 2017-06-17 DIAGNOSIS — Z806 Family history of leukemia: Secondary | ICD-10-CM | POA: Diagnosis not present

## 2017-06-17 DIAGNOSIS — Z8673 Personal history of transient ischemic attack (TIA), and cerebral infarction without residual deficits: Secondary | ICD-10-CM | POA: Diagnosis not present

## 2017-06-17 DIAGNOSIS — Z9049 Acquired absence of other specified parts of digestive tract: Secondary | ICD-10-CM | POA: Diagnosis not present

## 2017-06-17 DIAGNOSIS — E1140 Type 2 diabetes mellitus with diabetic neuropathy, unspecified: Secondary | ICD-10-CM | POA: Diagnosis not present

## 2017-06-17 DIAGNOSIS — Z510 Encounter for antineoplastic radiation therapy: Secondary | ICD-10-CM | POA: Diagnosis not present

## 2017-06-17 DIAGNOSIS — Z885 Allergy status to narcotic agent status: Secondary | ICD-10-CM | POA: Diagnosis not present

## 2017-06-17 DIAGNOSIS — Z7902 Long term (current) use of antithrombotics/antiplatelets: Secondary | ICD-10-CM | POA: Diagnosis not present

## 2017-06-17 DIAGNOSIS — E785 Hyperlipidemia, unspecified: Secondary | ICD-10-CM | POA: Diagnosis not present

## 2017-06-17 DIAGNOSIS — Z8249 Family history of ischemic heart disease and other diseases of the circulatory system: Secondary | ICD-10-CM | POA: Diagnosis not present

## 2017-06-17 DIAGNOSIS — Z7984 Long term (current) use of oral hypoglycemic drugs: Secondary | ICD-10-CM | POA: Diagnosis not present

## 2017-06-17 DIAGNOSIS — I10 Essential (primary) hypertension: Secondary | ICD-10-CM | POA: Diagnosis not present

## 2017-06-17 DIAGNOSIS — Z833 Family history of diabetes mellitus: Secondary | ICD-10-CM | POA: Diagnosis not present

## 2017-06-17 NOTE — Progress Notes (Signed)
  Radiation Oncology         [phone] ________________________________  Name: [name] MRN: [id number]  Date: 06/17/2017  DOB: 15-Mar-1941  Stereotactic Body Radiotherapy Treatment Procedure Note  NARRATIVE:  [name] was brought to the stereotactic radiation treatment machine and placed supine on the CT couch. The patient was set up for stereotactic body radiotherapy on the body fix pillow.  3D TREATMENT PLANNING AND DOSIMETRY:  The patient's radiation plan was reviewed and approved prior to starting treatment.  It showed 3-dimensional radiation distributions overlaid onto the planning CT.  The [facility] for the target structures as well as the organs at risk were reviewed. The documentation of this is filed in the radiation oncology EMR.  SIMULATION VERIFICATION:  The patient underwent CT imaging on the treatment unit.  These were carefully aligned to document that the ablative radiation dose would cover the target volume and maximally spare the nearby organs at risk according to the planned distribution.  SPECIAL TREATMENT PROCEDURE: [name] received high dose ablative stereotactic body radiotherapy to the planned target volume without unforeseen complications. Treatment was delivered uneventfully. The high doses associated with stereotactic body radiotherapy and the significant potential risks require careful treatment set up and patient monitoring constituting a special treatment procedure   STEREOTACTIC TREATMENT MANAGEMENT:  Following delivery, the patient was evaluated clinically. The patient tolerated treatment without significant acute effects, and was discharged to home in stable condition.    PLAN: Continue treatment as planned.  ________________________________  Blair Promise, PhD, MD  This document serves as a record of services personally performed by Gery Pray, MD. It was created on his behalf by Reola Mosher, a trained medical scribe. The  creation of this record is based on the scribe's personal observations and the provider's statements to them. This document has been checked and approved by the attending provider.

## 2017-06-18 ENCOUNTER — Ambulatory Visit: Payer: Medicare Other | Admitting: Radiation Oncology

## 2017-06-19 ENCOUNTER — Ambulatory Visit
Admission: RE | Admit: 2017-06-19 | Discharge: 2017-06-19 | Disposition: A | Discharge status: Home / Self Care | Payer: Medicare Other | Source: Ambulatory Visit | Attending: Radiation Oncology | Admitting: Radiation Oncology

## 2017-06-19 DIAGNOSIS — Z7984 Long term (current) use of oral hypoglycemic drugs: Secondary | ICD-10-CM | POA: Diagnosis not present

## 2017-06-19 DIAGNOSIS — Z79899 Other long term (current) drug therapy: Secondary | ICD-10-CM | POA: Diagnosis not present

## 2017-06-19 DIAGNOSIS — Z7902 Long term (current) use of antithrombotics/antiplatelets: Secondary | ICD-10-CM | POA: Diagnosis not present

## 2017-06-19 DIAGNOSIS — E785 Hyperlipidemia, unspecified: Secondary | ICD-10-CM | POA: Diagnosis not present

## 2017-06-19 DIAGNOSIS — Z510 Encounter for antineoplastic radiation therapy: Secondary | ICD-10-CM | POA: Diagnosis not present

## 2017-06-19 DIAGNOSIS — E1140 Type 2 diabetes mellitus with diabetic neuropathy, unspecified: Secondary | ICD-10-CM | POA: Diagnosis not present

## 2017-06-19 DIAGNOSIS — C3491 Malignant neoplasm of unspecified part of right bronchus or lung: Secondary | ICD-10-CM

## 2017-06-19 DIAGNOSIS — Z9049 Acquired absence of other specified parts of digestive tract: Secondary | ICD-10-CM | POA: Diagnosis not present

## 2017-06-19 DIAGNOSIS — Z833 Family history of diabetes mellitus: Secondary | ICD-10-CM | POA: Diagnosis not present

## 2017-06-19 DIAGNOSIS — Z885 Allergy status to narcotic agent status: Secondary | ICD-10-CM | POA: Diagnosis not present

## 2017-06-19 DIAGNOSIS — Z806 Family history of leukemia: Secondary | ICD-10-CM | POA: Diagnosis not present

## 2017-06-19 DIAGNOSIS — Z888 Allergy status to other drugs, medicaments and biological substances status: Secondary | ICD-10-CM | POA: Diagnosis not present

## 2017-06-19 DIAGNOSIS — K219 Gastro-esophageal reflux disease without esophagitis: Secondary | ICD-10-CM | POA: Diagnosis not present

## 2017-06-19 DIAGNOSIS — Z8249 Family history of ischemic heart disease and other diseases of the circulatory system: Secondary | ICD-10-CM | POA: Diagnosis not present

## 2017-06-19 DIAGNOSIS — Z8673 Personal history of transient ischemic attack (TIA), and cerebral infarction without residual deficits: Secondary | ICD-10-CM | POA: Diagnosis not present

## 2017-06-19 DIAGNOSIS — Z7982 Long term (current) use of aspirin: Secondary | ICD-10-CM | POA: Diagnosis not present

## 2017-06-19 DIAGNOSIS — C3411 Malignant neoplasm of upper lobe, right bronchus or lung: Secondary | ICD-10-CM | POA: Diagnosis not present

## 2017-06-19 DIAGNOSIS — I10 Essential (primary) hypertension: Secondary | ICD-10-CM | POA: Diagnosis not present

## 2017-06-19 NOTE — Progress Notes (Signed)
  Radiation Oncology         [phone] ________________________________  Name: [name] MRN: [id number]  Date: 06/19/2017  DOB: 05/17/1941  Stereotactic Body Radiotherapy Treatment Procedure Note  NARRATIVE:  [name] was brought to the stereotactic radiation treatment machine and placed supine on the CT couch. The patient was set up for stereotactic body radiotherapy on the body fix pillow.  3D TREATMENT PLANNING AND DOSIMETRY:  The patient's radiation plan was reviewed and approved prior to starting treatment.  It showed 3-dimensional radiation distributions overlaid onto the planning CT.  The [facility] for the target structures as well as the organs at risk were reviewed. The documentation of this is filed in the radiation oncology EMR.  SIMULATION VERIFICATION:  The patient underwent CT imaging on the treatment unit.  These were carefully aligned to document that the ablative radiation dose would cover the target volume and maximally spare the nearby organs at risk according to the planned distribution.  SPECIAL TREATMENT PROCEDURE: [name] received high dose ablative stereotactic body radiotherapy to the planned target volume without unforeseen complications. Treatment was delivered uneventfully. The high doses associated with stereotactic body radiotherapy and the significant potential risks require careful treatment set up and patient monitoring constituting a special treatment procedure   STEREOTACTIC TREATMENT MANAGEMENT:  Following delivery, the patient was evaluated clinically. The patient tolerated treatment without significant acute effects, and was discharged to home in stable condition.    PLAN: Continue treatment as planned.  ________________________________  Blair Promise, PhD, MD  This document serves as a record of services personally performed by Gery Pray, MD. It was created on his behalf by Valeta Harms, a trained medical scribe. The creation  of this record is based on the scribe's personal observations and the provider's statements to them. This document has been checked and approved by the attending provider.

## 2017-06-21 ENCOUNTER — Ambulatory Visit
Admission: RE | Admit: 2017-06-21 | Discharge: 2017-06-21 | Disposition: A | Discharge status: Home / Self Care | Payer: Medicare Other | Source: Ambulatory Visit | Attending: Radiation Oncology | Admitting: Radiation Oncology

## 2017-06-21 DIAGNOSIS — Z806 Family history of leukemia: Secondary | ICD-10-CM | POA: Diagnosis not present

## 2017-06-21 DIAGNOSIS — C3411 Malignant neoplasm of upper lobe, right bronchus or lung: Secondary | ICD-10-CM | POA: Diagnosis not present

## 2017-06-21 DIAGNOSIS — Z888 Allergy status to other drugs, medicaments and biological substances status: Secondary | ICD-10-CM | POA: Diagnosis not present

## 2017-06-21 DIAGNOSIS — Z833 Family history of diabetes mellitus: Secondary | ICD-10-CM | POA: Diagnosis not present

## 2017-06-21 DIAGNOSIS — E785 Hyperlipidemia, unspecified: Secondary | ICD-10-CM | POA: Diagnosis not present

## 2017-06-21 DIAGNOSIS — E1140 Type 2 diabetes mellitus with diabetic neuropathy, unspecified: Secondary | ICD-10-CM | POA: Diagnosis not present

## 2017-06-21 DIAGNOSIS — Z8249 Family history of ischemic heart disease and other diseases of the circulatory system: Secondary | ICD-10-CM | POA: Diagnosis not present

## 2017-06-21 DIAGNOSIS — K219 Gastro-esophageal reflux disease without esophagitis: Secondary | ICD-10-CM | POA: Diagnosis not present

## 2017-06-21 DIAGNOSIS — Z8673 Personal history of transient ischemic attack (TIA), and cerebral infarction without residual deficits: Secondary | ICD-10-CM | POA: Diagnosis not present

## 2017-06-21 DIAGNOSIS — Z510 Encounter for antineoplastic radiation therapy: Secondary | ICD-10-CM | POA: Diagnosis not present

## 2017-06-21 DIAGNOSIS — Z9049 Acquired absence of other specified parts of digestive tract: Secondary | ICD-10-CM | POA: Diagnosis not present

## 2017-06-21 DIAGNOSIS — Z7982 Long term (current) use of aspirin: Secondary | ICD-10-CM | POA: Diagnosis not present

## 2017-06-21 DIAGNOSIS — Z885 Allergy status to narcotic agent status: Secondary | ICD-10-CM | POA: Diagnosis not present

## 2017-06-21 DIAGNOSIS — I10 Essential (primary) hypertension: Secondary | ICD-10-CM | POA: Diagnosis not present

## 2017-06-21 DIAGNOSIS — Z7984 Long term (current) use of oral hypoglycemic drugs: Secondary | ICD-10-CM | POA: Diagnosis not present

## 2017-06-21 DIAGNOSIS — Z7902 Long term (current) use of antithrombotics/antiplatelets: Secondary | ICD-10-CM | POA: Diagnosis not present

## 2017-06-21 DIAGNOSIS — Z79899 Other long term (current) drug therapy: Secondary | ICD-10-CM | POA: Diagnosis not present

## 2017-06-28 ENCOUNTER — Ambulatory Visit: Payer: Medicare Other | Admitting: Pulmonary Disease

## 2017-07-03 ENCOUNTER — Other Ambulatory Visit: Payer: Self-pay | Admitting: Family Medicine

## 2017-07-03 DIAGNOSIS — Z9889 Other specified postprocedural states: Secondary | ICD-10-CM

## 2017-07-03 NOTE — Telephone Encounter (Signed)
Ok to renew?  

## 2017-07-04 ENCOUNTER — Encounter: Payer: Self-pay | Admitting: Family Medicine

## 2017-07-04 ENCOUNTER — Ambulatory Visit (INDEPENDENT_AMBULATORY_CARE_PROVIDER_SITE_OTHER): Payer: Medicare Other | Admitting: Family Medicine

## 2017-07-04 VITALS — BP 110/60 | HR 76 | Resp 18 | Ht 65.5 in | Wt 140.8 lb

## 2017-07-04 DIAGNOSIS — I10 Essential (primary) hypertension: Secondary | ICD-10-CM

## 2017-07-04 DIAGNOSIS — Z8673 Personal history of transient ischemic attack (TIA), and cerebral infarction without residual deficits: Secondary | ICD-10-CM | POA: Diagnosis not present

## 2017-07-04 DIAGNOSIS — E1159 Type 2 diabetes mellitus with other circulatory complications: Secondary | ICD-10-CM

## 2017-07-04 DIAGNOSIS — C3490 Malignant neoplasm of unspecified part of unspecified bronchus or lung: Secondary | ICD-10-CM | POA: Diagnosis not present

## 2017-07-04 DIAGNOSIS — E785 Hyperlipidemia, unspecified: Secondary | ICD-10-CM

## 2017-07-04 DIAGNOSIS — E1169 Type 2 diabetes mellitus with other specified complication: Secondary | ICD-10-CM | POA: Diagnosis not present

## 2017-07-04 DIAGNOSIS — F17210 Nicotine dependence, cigarettes, uncomplicated: Secondary | ICD-10-CM

## 2017-07-04 DIAGNOSIS — E1136 Type 2 diabetes mellitus with diabetic cataract: Secondary | ICD-10-CM

## 2017-07-04 LAB — POCT URINALYSIS DIP (PROADVANTAGE DEVICE)
BILIRUBIN UA: NEGATIVE mg/dL
Bilirubin, UA: NEGATIVE
Blood, UA: NEGATIVE
Leukocytes, UA: NEGATIVE
Nitrite, UA: NEGATIVE
Protein Ur, POC: NEGATIVE mg/dL
SPECIFIC GRAVITY, URINE: 1.02
Urobilinogen, Ur: NEGATIVE
pH, UA: 6 (ref 5.0–8.0)

## 2017-07-04 LAB — POCT UA - MICROALBUMIN
ALBUMIN/CREATININE RATIO, URINE, POC: 15.2
Creatinine, POC: 78.2 mg/dL
Microalbumin Ur, POC: 11.9 mg/L

## 2017-07-04 LAB — POCT GLYCOSYLATED HEMOGLOBIN (HGB A1C): Hemoglobin A1C: 8.8

## 2017-07-04 NOTE — Patient Instructions (Signed)
Every time youlight up a cigarette I want you to write down when, where and why your smoking,.  Keep track of that and then we can come up with a game plan of what to do instead of smoking.  Look at that list and then give yourself something else to do instead of smoking.  Make a list of things you will do and carry that list with you

## 2017-07-04 NOTE — Progress Notes (Signed)
[name] is a 76 y.o. female who presents for annual wellness visit and follow-up on chronic medical conditions.  She has the following concerns: She is presently being treated for a lung cancer and has had radiation.  This has obviously complicated her life.  Her husband is also going through cancer chemotherapy for his esophageal cancer.  This has interfered with her ability to maintain her present lifestyle.  She does continue to smoke but has cut back to roughly 6 or 7 cigarettes/day.  She continues on her Crestor as well as lisinopril/HCTZ, Janumet, Plavix.  She has been tried on SGLT2's however had difficulty with vaginitis from them.  She does not complain of any problems with any of these medications.  She is interested in quitting smoking entirely.  She does plan to set up an appointment with her ophthalmologist Dr. Katy Fitch in the near future.    Immunizations and Health Maintenance Immunization History  Administered Date(s) Administered  . Influenza Split 06/12/2011, 06/10/2012  . Influenza Whole 06/28/2009  . Influenza, High Dose Seasonal PF 06/09/2013, 06/29/2014, 07/05/2015, 05/18/2016, 05/01/2017  . Pneumococcal Conjugate-13 06/28/2009  . Pneumococcal Polysaccharide-23 02/10/2014  . Tdap 07/16/2005  . Zoster 10/10/2011   Health Maintenance Due  Topic Date Due  . OPHTHALMOLOGY EXAM  04/16/2015  . TETANUS/TDAP  07/17/2015  . FOOT EXAM  03/07/2016    Last Pap smear:complete Last mammogram: complete Last colonoscopy: complete Last DEXA: complete Dentist: unable to recall.  Ophtho: Dr. Katy Fitch  Exercise: none  Other doctors caring for patient include:  Dr. Vaughan Browner, Dr. Sondra Come, Dr. Erlinda Hong, Dr. Abbe Amsterdam- vascular.   Advanced directives: Does Patient Have a Medical Advance Directive?: No Would patient like information on creating a medical advance directive?: Yes (MAU/Ambulatory/Procedural Areas - Information given)  Depression screen:  See questionnaire below.  Depression screen  [facility] 2/9 07/04/2017 05/28/2017 05/13/2017 03/07/2016 03/08/2015  Decreased Interest 0 0 0 0 0  Down, Depressed, Hopeless 0 0 0 0 0  PHQ - 2 Score 0 0 0 0 0    Fall Risk Screen: see questionnaire below. Fall Risk  07/04/2017 05/28/2017 05/13/2017 04/15/2017 03/07/2016  Falls in the past year? No No No No No    ADL screen:  See questionnaire below Functional Status Survey: Is the patient deaf or have difficulty hearing?: No Does the patient have difficulty seeing, even when wearing glasses/contacts?: No Does the patient have difficulty concentrating, remembering, or making decisions?: No Does the patient have difficulty walking or climbing stairs?: No Does the patient have difficulty dressing or bathing?: No Does the patient have difficulty doing errands alone such as visiting a doctor's office or shopping?: No   Review of Systems Negative except as above   PHYSICAL EXAM:   General Appearance: Alert, cooperative, no distress, appears stated age Head: Normocephalic, without obvious abnormality, atraumatic Eyes: PERRL, conjunctiva/corneas clear, EOM's intact, fundi benign Ears: Normal TM's and external ear canals Nose: Nares normal, mucosa normal, no drainage or sinus tenderness Throat: Lips, mucosa, and tongue normal; teeth and gums normal Neck: Supple, no lymphadenopathy;  thyroid:  no enlargement/tenderness/nodules; no carotid bruit or JVD Lungs: Clear to auscultation bilaterally without wheezes, rales or ronchi; respirations unlabored Heart: Regular rate and rhythm, S1 and S2 normal, no murmur, rubor gallop  Extremities: No clubbing, cyanosis or edema Pulses: 2+ and symmetric all extremities Skin:  Skin color, texture, turgor normal, no rashes or lesions Lymph nodes: Cervical, supraclavicular, and axillary nodes normal Neurologic:  CNII-XII intact, normal strength, sensation and gait;  reflexes 2+ and symmetric throughout Psych: Normal mood, affect, hygiene and grooming. A1c is  8.8 ASSESSMENT/PLAN: Malignant neoplasm of lung, unspecified laterality, unspecified part of lung (HCC)  Light smoker  History of CVA (cerebrovascular accident)  DM type 2 with diabetic dyslipidemia ([place]) - Plan: POCT Urinalysis DIP (Proadvantage Device), HgB A1c, POCT UA - Microalbumin  Hypertension associated with diabetes ([place])  Hyperlipidemia associated with type 2 diabetes mellitus ([place])  Cataract associated with type 2 diabetes mellitus ([place])  She has certainly been under a lot of stress dealing with her own medical problems and now lung cancer as well as her husband dealing with cancer.  Discussed possibly adding another diabetes medicine including insulin but at this point she would like to hold off on that.  I am comfortable with that.  Encouraged her to work further on smoking cessation.  Did give information concerning documenting when, where and why she smokes and come up with a game plan to give her cell something else to do.  I explained that we will continue to work with her concerning this.  Medicare Attestation I have personally reviewed: The patient's medical and social history Their use of alcohol, tobacco or illicit drugs Their current medications and supplements The patient's functional ability including ADLs,fall risks, home safety risks, cognitive, and hearing and visual impairment Diet and physical activities Evidence for depression or mood disorders  The patient's weight, height, and BMI have been recorded in the chart.  I have made referrals, counseling, and provided education to the patient based on review of the above and I have provided the patient with a written personalized care plan for preventive services.     Jill Alexanders, MD   07/04/2017

## 2017-07-18 ENCOUNTER — Encounter: Payer: Self-pay | Admitting: Radiation Oncology

## 2017-07-18 NOTE — Progress Notes (Signed)
  Radiation Oncology         [phone]) [phone] ________________________________  Name: [name] MRN: [id number]  Date: 07/18/2017  DOB: April 18, 1941  End of Treatment Note  Diagnosis:   solitary pulmonary nodule presenting in the right upper lobe     Indication for treatment:  Curative       Radiation treatment dates:   06/11/17-06/21/17  Site/dose:   Right lung / 50 Gy delivered in 5 fractions   Beams/energy:   SBRT/SRT-3D / 6X-FFF  Narrative: The patient tolerated radiation treatment relatively well. Initially she was asymptomatic. Throughout the course of treatment, the patient developed mild fatigue. Otherwise, she is without acute complaints and denied SOB, pain or cough.  Plan: The patient has completed radiation treatment. The patient will return to radiation oncology clinic for routine followup in one month. I advised them to call or return sooner if they have any questions or concerns related to their recovery or treatment.  -----------------------------------  Blair Promise, PhD, MD  This document serves as a record of services personally performed by Gery Pray, MD. It was created on his behalf by Marlowe Kays, a trained medical scribe. The creation of this record is based on the scribe's personal observations and the provider's statements to them. This document has been checked and approved by the attending provider.

## 2017-07-19 ENCOUNTER — Encounter: Payer: Self-pay | Admitting: Oncology

## 2017-07-22 ENCOUNTER — Other Ambulatory Visit: Payer: Self-pay

## 2017-07-22 ENCOUNTER — Encounter: Payer: Self-pay | Admitting: Radiation Oncology

## 2017-07-22 ENCOUNTER — Ambulatory Visit: Payer: Medicare Other | Admitting: Neurology

## 2017-07-22 ENCOUNTER — Ambulatory Visit
Admission: RE | Admit: 2017-07-22 | Discharge: 2017-07-22 | Disposition: A | Discharge status: Home / Self Care | Payer: Medicare Other | Source: Ambulatory Visit | Attending: Radiation Oncology | Admitting: Radiation Oncology

## 2017-07-22 VITALS — BP 143/64 | HR 73 | Temp 97.9°F | Ht 65.5 in | Wt 142.6 lb

## 2017-07-22 DIAGNOSIS — E1140 Type 2 diabetes mellitus with diabetic neuropathy, unspecified: Secondary | ICD-10-CM | POA: Diagnosis not present

## 2017-07-22 DIAGNOSIS — Z8249 Family history of ischemic heart disease and other diseases of the circulatory system: Secondary | ICD-10-CM | POA: Diagnosis not present

## 2017-07-22 DIAGNOSIS — Z885 Allergy status to narcotic agent status: Secondary | ICD-10-CM | POA: Diagnosis not present

## 2017-07-22 DIAGNOSIS — Z79899 Other long term (current) drug therapy: Secondary | ICD-10-CM | POA: Diagnosis not present

## 2017-07-22 DIAGNOSIS — Z888 Allergy status to other drugs, medicaments and biological substances status: Secondary | ICD-10-CM | POA: Diagnosis not present

## 2017-07-22 DIAGNOSIS — I10 Essential (primary) hypertension: Secondary | ICD-10-CM | POA: Diagnosis not present

## 2017-07-22 DIAGNOSIS — C3491 Malignant neoplasm of unspecified part of right bronchus or lung: Secondary | ICD-10-CM

## 2017-07-22 DIAGNOSIS — Z833 Family history of diabetes mellitus: Secondary | ICD-10-CM | POA: Diagnosis not present

## 2017-07-22 DIAGNOSIS — Z7902 Long term (current) use of antithrombotics/antiplatelets: Secondary | ICD-10-CM | POA: Diagnosis not present

## 2017-07-22 DIAGNOSIS — Z7982 Long term (current) use of aspirin: Secondary | ICD-10-CM | POA: Diagnosis not present

## 2017-07-22 DIAGNOSIS — Z9049 Acquired absence of other specified parts of digestive tract: Secondary | ICD-10-CM | POA: Diagnosis not present

## 2017-07-22 DIAGNOSIS — Z8673 Personal history of transient ischemic attack (TIA), and cerebral infarction without residual deficits: Secondary | ICD-10-CM | POA: Diagnosis not present

## 2017-07-22 DIAGNOSIS — Z7984 Long term (current) use of oral hypoglycemic drugs: Secondary | ICD-10-CM | POA: Diagnosis not present

## 2017-07-22 DIAGNOSIS — K219 Gastro-esophageal reflux disease without esophagitis: Secondary | ICD-10-CM | POA: Diagnosis not present

## 2017-07-22 DIAGNOSIS — Z510 Encounter for antineoplastic radiation therapy: Secondary | ICD-10-CM | POA: Diagnosis not present

## 2017-07-22 DIAGNOSIS — Z806 Family history of leukemia: Secondary | ICD-10-CM | POA: Diagnosis not present

## 2017-07-22 DIAGNOSIS — C3411 Malignant neoplasm of upper lobe, right bronchus or lung: Secondary | ICD-10-CM | POA: Diagnosis not present

## 2017-07-22 DIAGNOSIS — E785 Hyperlipidemia, unspecified: Secondary | ICD-10-CM | POA: Diagnosis not present

## 2017-07-22 NOTE — Progress Notes (Signed)
[name] is here for follow up.  She denies having pain.  She continues to smoke 5-6 cigarettes per day.  She denies having shortness of breath.  She reports having a dry cough mostly at night.  She reports having occasional fatigue.  BP (!) 143/64 (BP Location: Right Arm, Patient Position: Sitting)   Pulse 73   Temp 97.9 F (36.6 C) (Oral)   Ht 5' 5.5" (1.664 m)   Wt 142 lb 9.6 oz (64.7 kg)   SpO2 100%   BMI 23.37 kg/m    Wt Readings from Last 3 Encounters:  07/22/17 142 lb 9.6 oz (64.7 kg)  07/04/17 140 lb 12.8 oz (63.9 kg)  05/28/17 143 lb 3.2 oz (65 kg)

## 2017-07-22 NOTE — Progress Notes (Signed)
  Radiation Oncology         [phone] ________________________________  Name: [name] MRN: [id number]  Date: 07/22/2017  DOB: 1940-10-15  Follow-Up Visit Note  CC: [name], MD  Melrose Nakayama, *    ICD-10-CM   1. Non-small cell cancer of right lung [facility]) C34.91     Diagnosis:   77 y.o. women with solitary pulmonary nodule presenting in the right upper lobe.  Interval Since Last Radiation:  1 months 06/11/17 - 06/21/17  Right Lung / 50 Gy in 5 fx  Narrative:  The patient returns today for routine follow-up.    She denies having pain.  She continues to smoke 5-6 cigarettes per day.  She denies having shortness of breath.  She reports having a dry cough mostly at night.  She reports having occasional fatigue. Denies coughing of blood.                   ALLERGIES:  is allergic to codeine; lipitor [atorvastatin]; and phenergan [promethazine hcl].  Meds: Current Outpatient Medications  Medication Sig Dispense Refill  . ACCU-CHEK AVIVA PLUS test strip CHECK BLOOD SUGAR TWO TIMES DAILY 200 each 3  . ACCU-CHEK SOFTCLIX LANCETS lancets Patient is to test two times a day DX E11.9 200 each 3  . Blood Glucose Monitoring Suppl (ACCU-CHEK AVIVA PLUS) w/Device KIT 1 kit by Does not apply route 2 (two) times daily. 1 kit 0  . clopidogrel (PLAVIX) 75 MG tablet TAKE 1 TABLET BY MOUTH  DAILY 90 tablet 1  . JANUMET 50-1000 MG tablet take 1 tablet by mouth twice a day with food 180 tablet 0  . lisinopril-hydrochlorothiazide (PRINZIDE,ZESTORETIC) 20-12.5 MG tablet Take 1 tablet by mouth daily.    . rosuvastatin (CRESTOR) 20 MG tablet take 1 tablet by mouth once daily 90 tablet 3   No current facility-administered medications for this encounter.     Physical Findings: The patient is in no acute distress. Patient is alert and oriented.  height is 5' 5.5" (1.664 m) and weight is 142 lb 9.6 oz (64.7 kg). Her oral temperature is 97.9 F (36.6 C). Her blood pressure is 143/64  (abnormal) and her pulse is 73. Her oxygen saturation is 100%. .   Lungs are clear to auscultation bilaterally. Heart has regular rate and rhythm. No palpable cervical, supraclavicular, or axillary adenopathy. Abdomen soft, non-tender, normal bowel sounds.   Lab Findings: Lab Results  Component Value Date   WBC 13.4 (H) 05/23/2017   HGB 11.3 (L) 05/23/2017   HCT 34.9 (L) 05/23/2017   MCV 89.5 05/23/2017   PLT 497 (H) 05/23/2017    Radiographic Findings: No results found.  Impression:  The patient is recovering from the effects of radiation. Clinically stable at this time. Patient has a mild cough at night.   Plan:  Patient will have a 3 month follow up and afterwards will be scheduled for the first CT scan after treatment.  ____________________________________  Blair Promise, PhD, MD  This document serves as a record of services personally performed by Gery Pray MD. It was created on his behalf by Delton Coombes, a trained medical scribe. The creation of this record is based on the scribe's personal observations and the provider's statements to them.

## 2017-07-24 ENCOUNTER — Ambulatory Visit: Payer: Medicare Other | Admitting: Neurology

## 2017-07-31 ENCOUNTER — Encounter: Payer: Self-pay | Admitting: Pulmonary Disease

## 2017-07-31 ENCOUNTER — Ambulatory Visit: Payer: Medicare Other | Admitting: Pulmonary Disease

## 2017-07-31 VITALS — BP 128/74 | HR 80 | Ht 65.0 in | Wt 145.2 lb

## 2017-07-31 DIAGNOSIS — C3491 Malignant neoplasm of unspecified part of right bronchus or lung: Secondary | ICD-10-CM | POA: Diagnosis not present

## 2017-07-31 DIAGNOSIS — F17200 Nicotine dependence, unspecified, uncomplicated: Secondary | ICD-10-CM

## 2017-07-31 DIAGNOSIS — R918 Other nonspecific abnormal finding of lung field: Secondary | ICD-10-CM

## 2017-07-31 NOTE — Progress Notes (Signed)
[name]    [id number]    10/03/1940  Primary Care Physician:Lalonde, Elyse Jarvis, MD  Referring Physician: [name], [address] [place], [place] [address]  Chief complaint:  Follow-up for Stage IIa non-small cell lung cancer, squamous cell  HPI: 77 year old with history of hypertension, diabetes, hyperlipidemia, stroke. She is here for follow-up of a right upper lobe mass which was discovered incidentally by being worked up for CVA. She was admitted in August 2018 for blurry vision. She had a CTA, MRI which did not show any acute abnormality. During this workup she had lung imaging which shows 3-4 cm right upper lobe spiculated lung nodule with cavitation.  She is a heavy smoker and continues to smoke half pack per day. She denies any dyspnea, cough, sputum production, hemoptysis, loss of weight, loss of appetite.  Interim history: She is seen by Dr. Roxan Hockey for evaluation of resection however she declined surgery as she was afraid of the complications and decided to go with radiation.  She underwent CT-guided biopsy which confirmed squamous cell cancer.  She is currently getting XRT by Dr. Sondra Come  Outpatient Encounter Medications as of 07/31/2017  Medication Sig  . ACCU-CHEK AVIVA PLUS test strip CHECK BLOOD SUGAR TWO TIMES DAILY  . ACCU-CHEK SOFTCLIX LANCETS lancets Patient is to test two times a day DX E11.9  . Blood Glucose Monitoring Suppl (ACCU-CHEK AVIVA PLUS) w/Device KIT 1 kit by Does not apply route 2 (two) times daily.  . clopidogrel (PLAVIX) 75 MG tablet TAKE 1 TABLET BY MOUTH  DAILY  . JANUMET 50-1000 MG tablet take 1 tablet by mouth twice a day with food  . lisinopril-hydrochlorothiazide (PRINZIDE,ZESTORETIC) 20-12.5 MG tablet Take 1 tablet by mouth daily.  . rosuvastatin (CRESTOR) 20 MG tablet take 1 tablet by mouth once daily   No facility-administered encounter medications on file as of 07/31/2017.     Allergies as of 07/31/2017 -  Review Complete 07/31/2017  Allergen Reaction Noted  . Codeine Nausea And Vomiting 01/04/2011  . Lipitor [atorvastatin] Nausea And Vomiting 07/03/2013  . Phenergan [promethazine hcl] Other (See Comments) 07/27/2013    Past Medical History:  Diagnosis Date  . Carotid artery occlusion   . Clostridium difficile infection 06/2013  . Diabetes mellitus    takes Janumet daily  . Dyslipidemia    takes Crestor daily  . Gallstones   . GERD (gastroesophageal reflux disease)   . Heart murmur    hx of  . History of radiation therapy 06/11/17-06/21/17   right lung 50 Gy in 5 fractions  . Hypertension    takes Prinzide and Verapamil daily  . Impaired speech    from stroke  . Neuropathy, diabetic ([place])   . Pneumonia   . PONV (postoperative nausea and vomiting)   . Smoker   . Stroke ([place]) 06/25/13  . Vertigo    but doesn't take any meds    Past Surgical History:  Procedure Laterality Date  . cataract removed Right   . CHOLECYSTECTOMY    . ENDARTERECTOMY Left 07/14/2013   Procedure: ENDARTERECTOMY CAROTID-LEFT;  Surgeon: Elam Dutch, MD;  Location: [facility];  Service: Vascular;  Laterality: Left;  . IR PERC PLEURAL DRAIN W/INDWELL CATH W/IMG GUIDE  05/23/2017  . KNEE SURGERY Left 99yr ago  . LAPAROSCOPIC PARTIAL COLECTOMY N/A 09/02/2015   Procedure: LAPAROSCOPIC PARTIAL RIGHT COLECTOMY;  Surgeon: ALeighton Ruff MD;  Location: [facility];  Service: General;  Laterality: N/A;  .  PATCH ANGIOPLASTY Left 07/14/2013   Procedure: LEFT CAROTID ARTERY PATCH ANGIOPLASTY;  Surgeon: Charles E Fields, MD;  Location: [facility];  Service: Vascular;  Laterality: Left;    Family History  Problem Relation Age of Onset  . Leukemia Mother   . Diabetes Mother   . Cancer Father        esophageal ca  . Heart disease Father   . Diabetes Maternal Aunt   . Diabetes Maternal Uncle   . Diabetes Brother   . Diabetes Sister   . Heart attack Brother        x 2    Social History   Socioeconomic History  .  Marital status: Married    Spouse name: james  . Number of children: 3  . Years of education: 10  . Highest education level: Not on file  Social Needs  . Financial resource strain: Not on file  . Food insecurity - worry: Not on file  . Food insecurity - inability: Not on file  . Transportation needs - medical: Not on file  . Transportation needs - non-medical: Not on file  Occupational History  . Occupation: retired  Tobacco Use  . Smoking status: Current Some Day Smoker    Packs/day: 0.50    Years: 60.00    Pack years: 30.00    Types: Cigarettes  . Smokeless tobacco: Never Used  Substance and Sexual Activity  . Alcohol use: No    Alcohol/week: 0.0 oz  . Drug use: No  . Sexual activity: Yes  Other Topics Concern  . Not on file  Social History Narrative   Patient is married with 3 children.   Patient is right handed.   Patient has 10 th grade education.   Patient drinks 4 cups daily.    Review of systems: Review of Systems  Constitutional: Negative for fever and chills.  HENT: Negative.   Eyes: Negative for blurred vision.  Respiratory: as per HPI  Cardiovascular: Negative for chest pain and palpitations.  Gastrointestinal: Negative for vomiting, diarrhea, blood per rectum. Genitourinary: Negative for dysuria, urgency, frequency and hematuria.  Musculoskeletal: Negative for myalgias, back pain and joint pain.  Skin: Negative for itching and rash.  Neurological: Negative for dizziness, tremors, focal weakness, seizures and loss of consciousness.  Endo/Heme/Allergies: Negative for environmental allergies.  Psychiatric/Behavioral: Negative for depression, suicidal ideas and hallucinations.  All other systems reviewed and are negative.  Physical Exam: Blood pressure 128/74, pulse 80, height 5' 5" (1.651 m), weight 145 lb 3.2 oz (65.9 kg), SpO2 97 %. Gen:      No acute distress HEENT:  EOMI, sclera anicteric Neck:     No masses; no thyromegaly Lungs:    Clear to  auscultation bilaterally; normal respiratory effort CV:         Regular rate and rhythm; no murmurs Abd:      + bowel sounds; soft, non-tender; no palpable masses, no distension Ext:    No edema; adequate peripheral perfusion Skin:      Warm and dry; no rash Neuro: alert and oriented x 3 Psych: normal mood and affect  Data Reviewed: CT head 02/15/17-no acute changes in the head. Right upper lobe spiculated cavitary mass. CT chest and abdomen 02/16/17-right upper lobe mass with cavitation. No mediastinal lymphadenopathy or evidence of metastatic disease in chest or abdomen. PET scan 04/26/17-intense uptake in the right upper lobe mass suspicious for malignancy. No evidence of metastatic spread. Coronary artery calcification I have reviewed all images personally    PFTs 05/01/17 FVC 2.44 (83%], FEV1 1.82 (82%], F/F 75, TLC 95%, DLCO 68%, DLCO/VA 96% Minimal obstruction and reduction in diffusion capacity.  CT-guided biopsy 05/23/17-squamous cell cancer.  Assessment:  Evaluation for right upper lobe lung mass Stage IIa non-small cell lung cancer. Undergoing radiation therapy and appears to be tolerating it well. She is not on any inhalers as she is asymptomatic, PFTs show minimal obstruction.  I have advised her to continue follow up with her oncology specialists.  We will be available for her care and she can follow-up in the pulmonary clinic as needed  Active smoker Smoking cessation encouraged.   Plan/Recommendations: - Continue radiation for Non small cell lung cancer  Return to clinic as needed.      MD [facility] Pager [phone] 07/31/2017, 11:53 AM  CC: Lalonde, [name], MD   

## 2017-07-31 NOTE — Patient Instructions (Addendum)
I am glad that you are on therapy for your lung cancer Please follow-up with your oncologist and Dr. Lorel Monaco can follow-up in the pulmonary clinic as needed.

## 2017-08-13 ENCOUNTER — Other Ambulatory Visit: Payer: Self-pay | Admitting: Medical

## 2017-08-19 ENCOUNTER — Telehealth: Payer: Self-pay | Admitting: Family Medicine

## 2017-08-19 NOTE — Telephone Encounter (Signed)
[facility] req T/s One Touch Ultra Blue  Diabetes visit sched 11/05/17

## 2017-08-21 ENCOUNTER — Other Ambulatory Visit: Payer: Self-pay

## 2017-08-21 ENCOUNTER — Telehealth: Payer: Self-pay

## 2017-08-21 MED ORDER — ONETOUCH ULTRASOFT LANCETS MISC: 12 refills | Status: AC

## 2017-08-21 MED ORDER — ONETOUCH ULTRA 2 W/DEVICE KIT: 1.0000 | PACK | Freq: Two times a day (BID) | 0 refills | Status: AC

## 2017-08-21 MED ORDER — GLUCOSE BLOOD VI STRP: ORAL_STRIP | 12 refills | Status: DC

## 2017-08-21 NOTE — Telephone Encounter (Signed)
CValled pt to confirm that she is wanting to change Glucose meters. Pt says she did want to change from Acc check to One touch ultra blue. Script was called into [facility] 08-21-17 and will be added to med list. Thanks [facility]

## 2017-10-05 ENCOUNTER — Other Ambulatory Visit: Payer: Self-pay | Admitting: Family Medicine

## 2017-10-10 ENCOUNTER — Encounter (HOSPITAL_COMMUNITY): Payer: Medicare Other

## 2017-10-10 ENCOUNTER — Ambulatory Visit: Payer: Medicare Other | Admitting: Family

## 2017-10-12 ENCOUNTER — Other Ambulatory Visit: Payer: Self-pay | Admitting: Family Medicine

## 2017-10-21 ENCOUNTER — Ambulatory Visit
Admission: RE | Admit: 2017-10-21 | Discharge: 2017-10-21 | Disposition: A | Discharge status: Home / Self Care | Payer: Medicare Other | Source: Ambulatory Visit | Attending: Radiation Oncology | Admitting: Radiation Oncology

## 2017-10-21 ENCOUNTER — Other Ambulatory Visit: Payer: Self-pay

## 2017-10-21 ENCOUNTER — Encounter: Payer: Self-pay | Admitting: Radiation Oncology

## 2017-10-21 VITALS — BP 135/63 | HR 83 | Temp 98.1°F | Resp 20 | Wt 147.2 lb

## 2017-10-21 DIAGNOSIS — Z08 Encounter for follow-up examination after completed treatment for malignant neoplasm: Secondary | ICD-10-CM | POA: Diagnosis not present

## 2017-10-21 DIAGNOSIS — Z888 Allergy status to other drugs, medicaments and biological substances status: Secondary | ICD-10-CM | POA: Diagnosis not present

## 2017-10-21 DIAGNOSIS — Z885 Allergy status to narcotic agent status: Secondary | ICD-10-CM | POA: Insufficient documentation

## 2017-10-21 DIAGNOSIS — R05 Cough: Secondary | ICD-10-CM | POA: Diagnosis not present

## 2017-10-21 DIAGNOSIS — Z7902 Long term (current) use of antithrombotics/antiplatelets: Secondary | ICD-10-CM | POA: Diagnosis not present

## 2017-10-21 DIAGNOSIS — C3411 Malignant neoplasm of upper lobe, right bronchus or lung: Secondary | ICD-10-CM | POA: Insufficient documentation

## 2017-10-21 DIAGNOSIS — Z7984 Long term (current) use of oral hypoglycemic drugs: Secondary | ICD-10-CM | POA: Diagnosis not present

## 2017-10-21 DIAGNOSIS — Z923 Personal history of irradiation: Secondary | ICD-10-CM | POA: Diagnosis not present

## 2017-10-21 DIAGNOSIS — C3491 Malignant neoplasm of unspecified part of right bronchus or lung: Secondary | ICD-10-CM

## 2017-10-21 DIAGNOSIS — Z79899 Other long term (current) drug therapy: Secondary | ICD-10-CM | POA: Insufficient documentation

## 2017-10-21 NOTE — Progress Notes (Signed)
  Radiation Oncology         [phone] ________________________________  Name: [name] MRN: [id number]  Date: 10/21/2017  DOB: [birth year]  Follow-Up Visit Note  CC: [name], MD  Melrose Nakayama, *    ICD-10-CM   1. Non-small cell cancer of right lung [facility]) C34.91     Diagnosis:   77 y.o. women with solitary pulmonary nodule presenting in the right upper lobe, non small cell  Interval Since Last Radiation:  4 months 06/11/17 - 06/21/17: Right Lung / 50 Gy in 5 fx  Narrative:  The patient returns today for routine follow-up.   She reports upper back pain on Saturday that took her breath away. She notes this was related to twisting her back while laying in bed. This pain resolved later that day. She reports a productive cough with clear phlegm. She denies pain, fatigue, shortness of breath, difficulty swallowing, or skin issues. She reports a good appetite.                   ALLERGIES:  is allergic to codeine; lipitor [atorvastatin]; and phenergan [promethazine hcl].  Meds: Current Outpatient Medications  Medication Sig Dispense Refill  . Blood Glucose Monitoring Suppl (ONE TOUCH ULTRA 2) w/Device KIT 1 Device by Does not apply route 2 (two) times daily. 1 each 0  . clopidogrel (PLAVIX) 75 MG tablet TAKE 1 TABLET BY MOUTH  DAILY 90 tablet 1  . glucose blood (ONE TOUCH ULTRA TEST) test strip Use as instructed 100 each 12  . JANUMET 50-1000 MG tablet TAKE 1 TABLET BY MOUTH TWICE DAILY 120 tablet 0  . Lancets (ONETOUCH ULTRASOFT) lancets Use as instructed 100 each 12  . lisinopril-hydrochlorothiazide (PRINZIDE,ZESTORETIC) 20-12.5 MG tablet TAKE 1 TABLET BY MOUTH  DAILY 90 tablet 0  . rosuvastatin (CRESTOR) 20 MG tablet take 1 tablet by mouth once daily 90 tablet 3   No current facility-administered medications for this encounter.     Physical Findings: The patient is in no acute distress. Patient is alert and oriented.  weight is 147 lb 4 oz (66.8 kg). Her  oral temperature is 98.1 F (36.7 C). Her blood pressure is 135/63 and her pulse is 83. Her respiration is 20 and oxygen saturation is 98%. .   Lungs are clear to auscultation bilaterally. Heart has regular rate and rhythm. No palpable cervical, supraclavicular, or axillary adenopathy. Abdomen soft, non-tender, normal bowel sounds.   Lab Findings: Lab Results  Component Value Date   WBC 13.4 (H) 05/23/2017   HGB 11.3 (L) 05/23/2017   HCT 34.9 (L) 05/23/2017   MCV 89.5 05/23/2017   PLT 497 (H) 05/23/2017    Radiographic Findings: No results found.  Impression:  The patient is recovering from the effects of radiation. Clinically stable at this time.  Plan:  Patient will follow-up in 6 months. She will be scheduled for CT scan later this week.  ____________________________________  Blair Promise, PhD, MD  This document serves as a record of services personally performed by Gery Pray MD. It was created on his behalf by Bethann Humble, a trained medical scribe. The creation of this record is based on the scribe's personal observations and the provider's statements to them.

## 2017-10-21 NOTE — Progress Notes (Signed)
[name] is here for her follow-up appointment.Patient denies any pain  today. States that she had pain in her upper back on Saturday that took her breath. Denies any fatigue. Patient denies any shortness of breath or difficulty with swallowing. States that she coughs up clear phlegm. States that her appetite is good.Deneis any skin issues. Vitals:   10/21/17 1131  BP: 135/63  Pulse: 83  Resp: 20  Temp: 98.1 F (36.7 C)  TempSrc: Oral  SpO2: 98%  Weight: 147 lb 4 oz (66.8 kg)   Wt Readings from Last 3 Encounters:  10/21/17 147 lb 4 oz (66.8 kg)  07/31/17 145 lb 3.2 oz (65.9 kg)  07/22/17 142 lb 9.6 oz (64.7 kg)

## 2017-10-28 ENCOUNTER — Ambulatory Visit (HOSPITAL_COMMUNITY)
Admission: RE | Admit: 2017-10-28 | Discharge: 2017-10-28 | Disposition: A | Discharge status: Home / Self Care | Payer: Medicare Other | Source: Ambulatory Visit | Attending: Radiation Oncology | Admitting: Radiation Oncology

## 2017-10-28 ENCOUNTER — Encounter (HOSPITAL_COMMUNITY): Payer: Self-pay

## 2017-10-28 DIAGNOSIS — J439 Emphysema, unspecified: Secondary | ICD-10-CM | POA: Insufficient documentation

## 2017-10-28 DIAGNOSIS — C3491 Malignant neoplasm of unspecified part of right bronchus or lung: Secondary | ICD-10-CM | POA: Insufficient documentation

## 2017-10-28 DIAGNOSIS — I700 Atherosclerosis of aorta: Secondary | ICD-10-CM | POA: Diagnosis not present

## 2017-10-28 DIAGNOSIS — I2510 Atherosclerotic heart disease of native coronary artery without angina pectoris: Secondary | ICD-10-CM | POA: Insufficient documentation

## 2017-11-01 ENCOUNTER — Telehealth: Payer: Self-pay | Admitting: Radiation Oncology

## 2017-11-01 NOTE — Telephone Encounter (Signed)
Dr. Sondra Come called me back to say that I could relay the message to [name] that everything looked good on her CT scan on 4/15 and that he would repeat it in six months. [name] explained understanding.

## 2017-11-05 ENCOUNTER — Encounter: Payer: Self-pay | Admitting: Family Medicine

## 2017-11-05 ENCOUNTER — Ambulatory Visit (INDEPENDENT_AMBULATORY_CARE_PROVIDER_SITE_OTHER): Payer: Medicare Other | Admitting: Family Medicine

## 2017-11-05 VITALS — BP 142/72 | HR 77 | Temp 97.9°F | Ht 66.0 in | Wt 147.0 lb

## 2017-11-05 DIAGNOSIS — C3490 Malignant neoplasm of unspecified part of unspecified bronchus or lung: Secondary | ICD-10-CM | POA: Diagnosis not present

## 2017-11-05 DIAGNOSIS — E1169 Type 2 diabetes mellitus with other specified complication: Secondary | ICD-10-CM | POA: Diagnosis not present

## 2017-11-05 DIAGNOSIS — E1159 Type 2 diabetes mellitus with other circulatory complications: Secondary | ICD-10-CM

## 2017-11-05 DIAGNOSIS — I10 Essential (primary) hypertension: Secondary | ICD-10-CM

## 2017-11-05 DIAGNOSIS — F17210 Nicotine dependence, cigarettes, uncomplicated: Secondary | ICD-10-CM

## 2017-11-05 DIAGNOSIS — E785 Hyperlipidemia, unspecified: Secondary | ICD-10-CM

## 2017-11-05 LAB — POCT GLYCOSYLATED HEMOGLOBIN (HGB A1C): Hemoglobin A1C: 9

## 2017-11-05 NOTE — Addendum Note (Signed)
Addended by: Elyse Jarvis on: 11/05/2017 10:39 AM   Modules accepted: Orders

## 2017-11-05 NOTE — Patient Instructions (Signed)
Start walking even if it is just 5 minutes at a time and trying to it several times per day I would like to see walking for 20 minutes every day

## 2017-11-05 NOTE — Progress Notes (Signed)
  Subjective:    Patient ID: [name], female    DOB: January 15, 1941, 77 y.o.   MRN: [id number]  [name] is a 77 y.o. female who presents for follow-up of Type 2 diabetes mellitus.  Patient is checking home blood sugars.   Home blood sugar records: meter record How often is blood sugars being checked: bid Current symptoms/problems include none and have been unchanged. Daily foot checks: yes   Any foot concerns: no Last eye exam: two years ago Exercise: not much she continues on Janumet taking it twice per day.  She also continues on Crestor as well as lisinopril.  She also is taking Plavix.  She completed 5 radiation course of her treatment of her underlying lung cancer.  She continues to smoke but says she is down to 1 pack lasting 3 days.  The following portions of the patient's history were reviewed and updated as appropriate: allergies, current medications, past medical history, past social history and problem list.  ROS as in subjective above.     Objective:    Physical Exam Alert and in no distress otherwise not examined.   Lab Review Diabetic Labs Latest Ref Rng & Units 07/04/2017 02/17/2017 02/16/2017 02/15/2017 02/15/2017  HbA1c - 8.8 - 8.4(H) - -  Microalbumin mg/L 11.9 - - - -  Micro/Creat Ratio - 15.2 - - - -  Chol 0 - 200 mg/dL - - 154 - -  HDL >40 mg/dL - - 38(L) - -  Calc LDL 0 - 99 mg/dL - - 68 - -  Triglycerides <150 mg/dL - - 241(H) - -  Creatinine 0.44 - 1.00 mg/dL - 1.05(H) - 1.20(H) 1.24(H)   BP/Weight 10/21/2017 07/31/2017 07/22/2017 07/04/2017 38/88/2800  Systolic BP 349 179 150 569 794  Diastolic BP 63 74 64 60 62  Wt. (Lbs) 147.25 145.2 142.6 140.8 143.2  BMI 24.5 24.16 23.37 23.07 23.11   Foot/eye exam completion dates Latest Ref Rng & Units 03/08/2015 04/15/2014  Eye Exam No Retinopathy - No Retinopathy  Foot Form Completion - Done -  Hemoglobin A1c is 9.0  [name]  reports that she has been smoking cigarettes.  She has a 30.00 pack-year smoking  history. She has never used smokeless tobacco. She reports that she does not drink alcohol or use drugs.     Assessment & Plan:    Malignant neoplasm of lung, unspecified laterality, unspecified part of lung ([place])  Light smoker  DM type 2 with diabetic dyslipidemia ([place])  Hypertension associated with diabetes ([place])  Hyperlipidemia associated with type 2 diabetes mellitus ([place])   1. Rx changes: none I told her I would like to add a medication to her regimen for her diabetes however she refused. 2. Education: Reviewed 'ABCs' of diabetes management (respective goals in parentheses):  A1C (<7), blood pressure (<130/80), and cholesterol (LDL <100). 3. Compliance at present is estimated to be poor. Efforts to improve compliance (if necessary) will be directed at increased exercise. 4. Follow up: 4 months continue on present medications. I also strongly encouraged her to quit smoking entirely.  Did discuss habit versus addiction.  Explained that at this point she is truly addicted to the nicotine but more of a habit.  She admits to using this as a stress reducer.

## 2017-11-07 ENCOUNTER — Telehealth: Payer: Self-pay

## 2017-11-07 NOTE — Telephone Encounter (Signed)
Called pt and spoke with her daughter [name]. Informed her of pt's recent CT scan results. Daughter verbalized understanding and stated that she would update the pt.

## 2017-11-13 ENCOUNTER — Other Ambulatory Visit: Payer: Self-pay

## 2017-11-13 ENCOUNTER — Telehealth: Payer: Self-pay

## 2017-11-13 MED ORDER — SITAGLIPTIN PHOS-METFORMIN HCL 50-1000 MG PO TABS: 1.0000 | ORAL_TABLET | Freq: Two times a day (BID) | ORAL | 0 refills | Status: DC

## 2017-11-13 NOTE — Telephone Encounter (Signed)
RX request from [facility] for Janumet tabs for 3 month supply.

## 2017-11-13 NOTE — Telephone Encounter (Signed)
Done KH 

## 2017-11-14 ENCOUNTER — Other Ambulatory Visit: Payer: Self-pay | Admitting: Family Medicine

## 2017-11-28 ENCOUNTER — Ambulatory Visit (HOSPITAL_COMMUNITY)
Admission: RE | Admit: 2017-11-28 | Discharge: 2017-11-28 | Disposition: A | Discharge status: Home / Self Care | Payer: Medicare Other | Source: Ambulatory Visit | Attending: Family | Admitting: Family

## 2017-11-28 ENCOUNTER — Encounter: Payer: Self-pay | Admitting: Family

## 2017-11-28 ENCOUNTER — Ambulatory Visit: Payer: Medicare Other | Admitting: Family

## 2017-11-28 ENCOUNTER — Other Ambulatory Visit: Payer: Self-pay

## 2017-11-28 VITALS — BP 125/73 | HR 89 | Temp 98.3°F | Resp 14 | Ht 66.0 in | Wt 142.0 lb

## 2017-11-28 DIAGNOSIS — Z9889 Other specified postprocedural states: Secondary | ICD-10-CM | POA: Diagnosis not present

## 2017-11-28 DIAGNOSIS — F17200 Nicotine dependence, unspecified, uncomplicated: Secondary | ICD-10-CM | POA: Diagnosis not present

## 2017-11-28 DIAGNOSIS — Z794 Long term (current) use of insulin: Secondary | ICD-10-CM | POA: Insufficient documentation

## 2017-11-28 DIAGNOSIS — I6522 Occlusion and stenosis of left carotid artery: Secondary | ICD-10-CM | POA: Diagnosis not present

## 2017-11-28 DIAGNOSIS — E785 Hyperlipidemia, unspecified: Secondary | ICD-10-CM | POA: Insufficient documentation

## 2017-11-28 DIAGNOSIS — I10 Essential (primary) hypertension: Secondary | ICD-10-CM | POA: Insufficient documentation

## 2017-11-28 DIAGNOSIS — Z8673 Personal history of transient ischemic attack (TIA), and cerebral infarction without residual deficits: Secondary | ICD-10-CM

## 2017-11-28 DIAGNOSIS — E119 Type 2 diabetes mellitus without complications: Secondary | ICD-10-CM | POA: Diagnosis not present

## 2017-11-28 NOTE — Patient Instructions (Signed)
Steps to Quit Smoking Smoking tobacco can be bad for your health. It can also affect almost every organ in your body. Smoking puts you and people around you at risk for many serious long-lasting (chronic) diseases. Quitting smoking is hard, but it is one of the best things that you can do for your health. It is never too late to quit. What are the benefits of quitting smoking? When you quit smoking, you lower your risk for getting serious diseases and conditions. They can include:  Lung cancer or lung disease.  Heart disease.  Stroke.  Heart attack.  Not being able to have children (infertility).  Weak bones (osteoporosis) and broken bones (fractures).  If you have coughing, wheezing, and shortness of breath, those symptoms may get better when you quit. You may also get sick less often. If you are pregnant, quitting smoking can help to lower your chances of having a baby of low birth weight. What can I do to help me quit smoking? Talk with your doctor about what can help you quit smoking. Some things you can do (strategies) include:  Quitting smoking totally, instead of slowly cutting back how much you smoke over a period of time.  Going to in-person counseling. You are more likely to quit if you go to many counseling sessions.  Using resources and support systems, such as: ? Online chats with a counselor. ? Phone quitlines. ? Printed self-help materials. ? Support groups or group counseling. ? Text messaging programs. ? Mobile phone apps or applications.  Taking medicines. Some of these medicines may have nicotine in them. If you are pregnant or breastfeeding, do not take any medicines to quit smoking unless your doctor says it is okay. Talk with your doctor about counseling or other things that can help you.  Talk with your doctor about using more than one strategy at the same time, such as taking medicines while you are also going to in-person counseling. This can help make  quitting easier. What things can I do to make it easier to quit? Quitting smoking might feel very hard at first, but there is a lot that you can do to make it easier. Take these steps:  Talk to your family and friends. Ask them to support and encourage you.  Call phone quitlines, reach out to support groups, or work with a counselor.  Ask people who smoke to not smoke around you.  Avoid places that make you want (trigger) to smoke, such as: ? Bars. ? Parties. ? Smoke-break areas at work.  Spend time with people who do not smoke.  Lower the stress in your life. Stress can make you want to smoke. Try these things to help your stress: ? Getting regular exercise. ? Deep-breathing exercises. ? Yoga. ? Meditating. ? Doing a body scan. To do this, close your eyes, focus on one area of your body at a time from head to toe, and notice which parts of your body are tense. Try to relax the muscles in those areas.  Download or buy apps on your mobile phone or tablet that can help you stick to your quit plan. There are many free apps, such as QuitGuide from the CDC (Centers for Disease Control and Prevention). You can find more support from smokefree.gov and other websites.  This information is not intended to replace advice given to you by your health care provider. Make sure you discuss any questions you have with your health care provider. Document Released: 04/28/2009 Document   Revised: 02/28/2016 Document Reviewed: 11/16/2014 Elsevier Interactive Patient Education  2018 Elsevier Inc.     Stroke Prevention Some health problems and behaviors may make it more likely for you to have a stroke. Below are ways to lessen your risk of having a stroke.  Be active for at least 30 minutes on most or all days.  Do not smoke. Try not to be around others who smoke.  Do not drink too much alcohol. ? Do not have more than 2 drinks a day if you are a man. ? Do not have more than 1 drink a day if you  are a woman and are not pregnant.  Eat healthy foods, such as fruits and vegetables. If you were put on a specific diet, follow the diet as told.  Keep your cholesterol levels under control through diet and medicines. Look for foods that are low in saturated fat, trans fat, cholesterol, and are high in fiber.  If you have diabetes, follow all diet plans and take your medicine as told.  Ask your doctor if you need treatment to lower your blood pressure. If you have high blood pressure (hypertension), follow all diet plans and take your medicine as told by your doctor.  If you are 18-39 years old, have your blood pressure checked every 3-5 years. If you are age 40 or older, have your blood pressure checked every year.  Keep a healthy weight. Eat foods that are low in calories, salt, saturated fat, trans fat, and cholesterol.  Do not take drugs.  Avoid birth control pills, if this applies. Talk to your doctor about the risks of taking birth control pills.  Talk to your doctor if you have sleep problems (sleep apnea).  Take all medicine as told by your doctor. ? You may be told to take aspirin or blood thinner medicine. Take this medicine as told by your doctor. ? Understand your medicine instructions.  Make sure any other conditions you have are being taken care of.  Get help right away if:  You suddenly lose feeling (you feel numb) or have weakness in your face, arm, or leg.  Your face or eyelid hangs down to one side.  You suddenly feel confused.  You have trouble talking (aphasia) or understanding what people are saying.  You suddenly have trouble seeing in one or both eyes.  You suddenly have trouble walking.  You are dizzy.  You lose your balance or your movements are clumsy (uncoordinated).  You suddenly have a very bad headache and you do not know the cause.  You have new chest pain.  Your heart feels like it is fluttering or skipping a beat (irregular  heartbeat). Do not wait to see if the symptoms above go away. Get help right away. Call your local emergency services (911 in [place] Do not drive yourself to the hospital. This information is not intended to replace advice given to you by your health care provider. Make sure you discuss any questions you have with your health care provider. Document Released: 01/01/2012 Document Revised: 12/08/2015 Document Reviewed: 01/02/2013 Elsevier Interactive Patient Education  2018 Elsevier Inc.  

## 2017-11-28 NOTE — Progress Notes (Signed)
Chief Complaint: Follow up Extracranial Carotid Artery Stenosis   History of Present Illness  [name] is a 77 y.o. female who is s/p left CEA with Dacron patch on 07/04/13 by Dr. Oneida Alar for symptomatic left internal carotid stenosis with exophytic calcified plaque. She returns today for follow up.  Patient has a history of TIA (Feb., 2015), or stroke (Dec.,2014) symptoms as manifested by right hand and right facial tingling and numbness, expressive aphasia and confusion; denies weakness on right side, denies monocular vision loss, denies unilateral facial drooping.  The patient's previous neurologic deficits are resolved.  She has not had any subsequent stroke or TIA.  Pt denies clauduaction symptoms with walking, denies non healing wounds, denies any known cardiac problems. She has left knee issues after falling on her left knee and had surgery years ago. Pt states she walks a great deal.  She had a partial colectomy in February 2017 for polyps that were benign. Shehad sepsis from c.diff, January, 2015. She has a left upper quadrant hernia that appeared after the colectomy, pt states it is not painful.  Pt has cancer of the right lung, she has had radiation tx, no surgery, no chemo.  Her husband has esophogeal cancer with mets to his liver, he smokes 2 ppd per pt, outside their house  Diabetic: Yes, A1C was 9.0 on 11-05-17  (review of records) Tobacco use: smoker (1/3 ppd, started at age 53 yrs)  Pt meds include: Statin : Yes ASA: No Other anticoagulants/antiplatelets: Plavix, started Feb., 2015 when she had the TIA, prescribed by Dr. Leonie Man; pt states she was discharged by Dr. Leonie Man in 2016 and to follow up prn    Past Medical History:  Diagnosis Date  . Carotid artery occlusion   . Clostridium difficile infection 06/2013  . Diabetes mellitus    takes Janumet daily  . Dyslipidemia    takes Crestor daily  . Gallstones   . GERD (gastroesophageal reflux  disease)   . Heart murmur    hx of  . History of radiation therapy 06/11/17-06/21/17   right lung 50 Gy in 5 fractions  . Hypertension    takes Prinzide and Verapamil daily  . Impaired speech    from stroke  . Neuropathy, diabetic ([place])   . Pneumonia   . PONV (postoperative nausea and vomiting)   . Smoker   . Stroke ([place]) 06/25/13  . Vertigo    but doesn't take any meds    Social History Social History   Tobacco Use  . Smoking status: Current Some Day Smoker    Packs/day: 0.50    Years: 60.00    Pack years: 30.00    Types: Cigarettes  . Smokeless tobacco: Never Used  Substance Use Topics  . Alcohol use: No    Alcohol/week: 0.0 oz  . Drug use: No    Family History Family History  Problem Relation Age of Onset  . Leukemia Mother   . Diabetes Mother   . Cancer Father        esophageal ca  . Heart disease Father   . Diabetes Maternal Aunt   . Diabetes Maternal Uncle   . Diabetes Brother   . Diabetes Sister   . Heart attack Brother        x 2    Surgical History Past Surgical History:  Procedure Laterality Date  . cataract removed Right   . CHOLECYSTECTOMY    . ENDARTERECTOMY Left 07/14/2013   Procedure: ENDARTERECTOMY CAROTID-LEFT;  Surgeon: Elam Dutch, MD;  Location: [facility];  Service: Vascular;  Laterality: Left;  . IR PERC PLEURAL DRAIN W/INDWELL CATH W/IMG GUIDE  05/23/2017  . KNEE SURGERY Left 67yr ago  . LAPAROSCOPIC PARTIAL COLECTOMY N/A 09/02/2015   Procedure: LAPAROSCOPIC PARTIAL RIGHT COLECTOMY;  Surgeon: ALeighton Ruff MD;  Location: [facility];  Service: General;  Laterality: N/A;  . PATCH ANGIOPLASTY Left 07/14/2013   Procedure: LEFT CAROTID ARTERY PATCH ANGIOPLASTY;  Surgeon: CElam Dutch MD;  [facility]  Service: Vascular;  Laterality: Left;    Allergies  Allergen Reactions  . Codeine Nausea And Vomiting  . Lipitor [Atorvastatin] Nausea And Vomiting  . Phenergan [Promethazine Hcl] Other (See Comments)    Confusion,  hallucinations, severe agitation    Current Outpatient Medications  Medication Sig Dispense Refill  . Blood Glucose Monitoring Suppl (ONE TOUCH ULTRA 2) w/Device KIT 1 Device by Does not apply route 2 (two) times daily. 1 each 0  . clopidogrel (PLAVIX) 75 MG tablet TAKE 1 TABLET BY MOUTH  DAILY 90 tablet 1  . glucose blood (ONE TOUCH ULTRA TEST) test strip Use as instructed 100 each 12  . JANUMET 50-1000 MG tablet TAKE 1 TABLET BY MOUTH TWICE DAILY 120 tablet 0  . Lancets (ONETOUCH ULTRASOFT) lancets Use as instructed 100 each 12  . lisinopril-hydrochlorothiazide (PRINZIDE,ZESTORETIC) 20-12.5 MG tablet TAKE 1 TABLET BY MOUTH  DAILY 90 tablet 0  . rosuvastatin (CRESTOR) 20 MG tablet take 1 tablet by mouth once daily 90 tablet 3  . sitaGLIPtin-metformin (JANUMET) 50-1000 MG tablet Take 1 tablet by mouth 2 (two) times daily. 120 tablet 0   No current facility-administered medications for this visit.     Review of Systems : See HPI for pertinent positives and negatives.  Physical Examination  Vitals:   11/28/17 1536 11/28/17 1538  BP: 122/70 125/73  Pulse: 91 89  Resp: 14   Temp: 98.3 F (36.8 C)   TempSrc: Oral   SpO2: 99%   Weight: 142 lb (64.4 kg)   Height: 5' 6" (1.676 m)    Body mass index is 22.92 kg/m.  General: WDWN female in NAD GAIT:normal Eyes: PERRLA Pulmonary: Non-labored respirations, no rales or rhonchi, transient wheezes, limited air movement in posterior fields. Cardiac: regular rhythm,positive low grade murmur.  VASCULAR EXAM Carotid Bruits Left Right   negative negative   Abdominal aortic pulse is not palpable Radial pulses are 1+ palpable and equal.      LE Pulses LEFT RIGHT   POPLITEAL not palpable  not palpable   POSTERIOR TIBIAL 1+palpable   1+palpable    DORSALIS  PEDIS  ANTERIOR TIBIAL not palpable  1+ palpable    Gastrointestinal: soft, nontender, BS WNL, no r/g, no palpable masses. Musculoskeletal: Noe muscle atrophy/wasting. M/S 5/5 throughout, Extremities without ischemic changes. Skin: No rashes, no ulcers, no cellulitis.     Neurologic:  A&O X 3; appropriate affect, sensation is normal; speech is normal, CN 2-12 intact, pain and light touch intact      in extremities, motor exam as listed above.   Psychiatric: Normal thought content, mood appropriate to clinical situation.    Assessment: Nichole BLECHERis a 77y.o. female who is s/p left CEA with Dacron patch on 07/04/13 for symptomatic left internal carotid stenosis with exophytic calcified plaque.  Patient has a history of TIA (Feb., 2015), or stroke (Dec.,2014) symptoms as manifested by right hand and right facial tingling and numbness, expressive aphasia and confusion. Her previous  neurologic deficits have resolved and she has not had any subsequent stroke or TIA.  Her atherosclerotic risk factors include uncontrolled DM and active smoking. She takes a statin and Plavix.   DATA Carotid Duplex (11/28/17): Right ICA: 1-39% stenosis Left ICA, CEA site, with 1-39% stenosis Bilateral vertebral artery flow is antegrade.  Bilateral subclavian artery waveforms are normal.  No significant change compared to exams of 09-29-15 and 10-04-16.    Plan:  The patient was counseled re smoking cessation and given several free resources re smoking cessation.   Follow-up in 1year with Carotid Duplex scan.   I discussed in depth with the patient the nature of atherosclerosis, and emphasized the importance of maximal medical management including strict control of blood pressure, blood glucose, and lipid levels, obtaining regular exercise, and cessation of smoking.  The patient is aware that without maximal medical management the underlying atherosclerotic disease process will  progress, limiting the benefit of any interventions. The patient was given information about stroke prevention and what symptoms should prompt the patient to seek immediate medical care. Thank you for allowing us to participate in this patient's care.   , RN, MSN, FNP-C [facility] Office: [phone]  Clinic Physician: Fields  11/28/17 5:02 PM  

## 2017-12-11 ENCOUNTER — Other Ambulatory Visit: Payer: Self-pay | Admitting: Family Medicine

## 2017-12-11 DIAGNOSIS — Z9889 Other specified postprocedural states: Secondary | ICD-10-CM

## 2017-12-11 NOTE — Telephone Encounter (Signed)
Please advise if plavix can be fill due to not having labs. [facility] is requesting.

## 2017-12-14 ENCOUNTER — Other Ambulatory Visit: Payer: Self-pay | Admitting: Family Medicine

## 2018-01-17 ENCOUNTER — Other Ambulatory Visit: Payer: Self-pay | Admitting: Family Medicine

## 2018-01-17 NOTE — Telephone Encounter (Signed)
Pt has an appt in august

## 2018-01-24 ENCOUNTER — Encounter (HOSPITAL_COMMUNITY): Payer: Self-pay | Admitting: Emergency Medicine

## 2018-01-24 ENCOUNTER — Other Ambulatory Visit: Payer: Self-pay

## 2018-01-24 ENCOUNTER — Emergency Department (HOSPITAL_COMMUNITY)
Admission: EM | Admit: 2018-01-24 | Discharge: 2018-01-24 | Disposition: A | Discharge status: Home / Self Care | Payer: Medicare Other | Attending: Emergency Medicine | Admitting: Emergency Medicine

## 2018-01-24 ENCOUNTER — Emergency Department (HOSPITAL_COMMUNITY): Payer: Medicare Other

## 2018-01-24 DIAGNOSIS — G43909 Migraine, unspecified, not intractable, without status migrainosus: Secondary | ICD-10-CM | POA: Diagnosis not present

## 2018-01-24 DIAGNOSIS — I10 Essential (primary) hypertension: Secondary | ICD-10-CM | POA: Diagnosis not present

## 2018-01-24 DIAGNOSIS — F17210 Nicotine dependence, cigarettes, uncomplicated: Secondary | ICD-10-CM | POA: Insufficient documentation

## 2018-01-24 DIAGNOSIS — Z79899 Other long term (current) drug therapy: Secondary | ICD-10-CM | POA: Insufficient documentation

## 2018-01-24 DIAGNOSIS — E119 Type 2 diabetes mellitus without complications: Secondary | ICD-10-CM | POA: Insufficient documentation

## 2018-01-24 DIAGNOSIS — R200 Anesthesia of skin: Secondary | ICD-10-CM | POA: Diagnosis not present

## 2018-01-24 DIAGNOSIS — R51 Headache: Secondary | ICD-10-CM | POA: Diagnosis not present

## 2018-01-24 DIAGNOSIS — R9431 Abnormal electrocardiogram [ECG] [EKG]: Secondary | ICD-10-CM | POA: Diagnosis not present

## 2018-01-24 DIAGNOSIS — Z7984 Long term (current) use of oral hypoglycemic drugs: Secondary | ICD-10-CM | POA: Insufficient documentation

## 2018-01-24 LAB — DIFFERENTIAL
Abs Immature Granulocytes: 0.1 10*3/uL (ref 0.0–0.1)
BASOS PCT: 0 %
Basophils Absolute: 0.1 10*3/uL (ref 0.0–0.1)
EOS ABS: 0.5 10*3/uL (ref 0.0–0.7)
EOS PCT: 3 %
IMMATURE GRANULOCYTES: 1 %
LYMPHS PCT: 13 %
Lymphs Abs: 1.9 10*3/uL (ref 0.7–4.0)
MONO ABS: 1 10*3/uL (ref 0.1–1.0)
Monocytes Relative: 7 %
Neutro Abs: 10.9 10*3/uL — ABNORMAL HIGH (ref 1.7–7.7)
Neutrophils Relative %: 76 %

## 2018-01-24 LAB — CBC
HEMATOCRIT: 35.5 % — AB (ref 36.0–46.0)
Hemoglobin: 11.5 g/dL — ABNORMAL LOW (ref 12.0–15.0)
MCH: 31 pg (ref 26.0–34.0)
MCHC: 32.4 g/dL (ref 30.0–36.0)
MCV: 95.7 fL (ref 78.0–100.0)
Platelets: 571 10*3/uL — ABNORMAL HIGH (ref 150–400)
RBC: 3.71 MIL/uL — ABNORMAL LOW (ref 3.87–5.11)
RDW: 13.9 % (ref 11.5–15.5)
WBC: 14.5 10*3/uL — ABNORMAL HIGH (ref 4.0–10.5)

## 2018-01-24 LAB — I-STAT CHEM 8, ED
BUN: 19 mg/dL (ref 8–23)
CALCIUM ION: 1.17 mmol/L (ref 1.15–1.40)
Chloride: 97 mmol/L — ABNORMAL LOW (ref 98–111)
Creatinine, Ser: 1.1 mg/dL — ABNORMAL HIGH (ref 0.44–1.00)
GLUCOSE: 216 mg/dL — AB (ref 70–99)
HCT: 36 % (ref 36.0–46.0)
HEMOGLOBIN: 12.2 g/dL (ref 12.0–15.0)
Potassium: 3.8 mmol/L (ref 3.5–5.1)
SODIUM: 134 mmol/L — AB (ref 135–145)
TCO2: 26 mmol/L (ref 22–32)

## 2018-01-24 LAB — COMPREHENSIVE METABOLIC PANEL
ALT: 10 U/L (ref 0–44)
AST: 12 U/L — AB (ref 15–41)
Albumin: 3.4 g/dL — ABNORMAL LOW (ref 3.5–5.0)
Alkaline Phosphatase: 70 U/L (ref 38–126)
Anion gap: 11 (ref 5–15)
BUN: 16 mg/dL (ref 8–23)
CHLORIDE: 99 mmol/L (ref 98–111)
CO2: 26 mmol/L (ref 22–32)
Calcium: 9.6 mg/dL (ref 8.9–10.3)
Creatinine, Ser: 1.16 mg/dL — ABNORMAL HIGH (ref 0.44–1.00)
GFR, EST AFRICAN AMERICAN: 51 mL/min — AB (ref >60)
GFR, EST NON AFRICAN AMERICAN: 44 mL/min — AB (ref >60)
Glucose, Bld: 215 mg/dL — ABNORMAL HIGH (ref 70–99)
POTASSIUM: 4.2 mmol/L (ref 3.5–5.1)
SODIUM: 136 mmol/L (ref 135–145)
Total Bilirubin: 0.9 mg/dL (ref 0.3–1.2)
Total Protein: 6.9 g/dL (ref 6.5–8.1)

## 2018-01-24 LAB — I-STAT TROPONIN, ED: TROPONIN I, POC: 0 ng/mL (ref 0.00–0.08)

## 2018-01-24 LAB — PROTIME-INR
INR: 1.02
Prothrombin Time: 13.3 seconds (ref 11.4–15.2)

## 2018-01-24 LAB — CBG MONITORING, ED: Glucose-Capillary: 124 mg/dL — ABNORMAL HIGH (ref 70–99)

## 2018-01-24 LAB — APTT: aPTT: 30 seconds (ref 24–36)

## 2018-01-24 MED ORDER — DIVALPROEX SODIUM 250 MG PO DR TAB
500.0000 mg | DELAYED_RELEASE_TABLET | Freq: Once | ORAL | Status: AC
Start: 2018-01-24 — End: 2018-01-24
  Administered 2018-01-24: 500 mg via ORAL
  Filled 2018-01-24: qty 2

## 2018-01-24 MED ORDER — DIVALPROEX SODIUM 500 MG PO DR TAB: 500.0000 mg | DELAYED_RELEASE_TABLET | Freq: Two times a day (BID) | ORAL | 0 refills | Status: DC

## 2018-01-24 NOTE — ED Notes (Signed)
Pt verbalized understanding of discharge instructions and denies any further questions at this time.   

## 2018-01-24 NOTE — ED Triage Notes (Signed)
Pt to ER for evaluation of intermittent frontal headache x2 weeks with associated numbness of left hand and blurred vision. Pt reports this has been going on for 2 weeks with the last occurrence this morning. At this time patient is in NAD. No neuro deficits noted. NIH 0 at this time. Pt is alert and oriented.

## 2018-01-24 NOTE — ED Provider Notes (Signed)
[facility] EMERGENCY DEPARTMENT Provider Note   CSN: [id number] Arrival date & time: 01/24/18  1124     History   Chief Complaint Chief Complaint  Patient presents with  . Headache  . Numbness    HPI [name] is a 77 y.o. female.  77 year old female with prior history as detailed below presents with complaint of several weeks of intermittent headache.  Headache comes and goes.  He can last several seconds to several minutes.  With the headache she has mild nausea and flashing lights in her vision.  She also complains of intermittent associated tingling in the left hand.  She denies any other focal weakness.  She denies associated speech change.  She reports a prior history of CVA that affected her speech.  She had complete resolution of those symptoms.  She reports a similar presentation approximately 1 year ago for same complaint.  At that time she had a CT and MRI without evidence of any acute process.  She is currently taking Plavix alone for stroke prevention.  The history is provided by the patient and medical records.  Headache   This is a recurrent problem. The current episode started more than 1 week ago. The problem occurs every few hours. The problem has been resolved. The headache is associated with nothing. The pain is located in the left unilateral and frontal region. The quality of the pain is described as sharp. The pain is mild. The pain does not radiate. She has tried nothing for the symptoms.    Past Medical History:  Diagnosis Date  . Carotid artery occlusion   . Clostridium difficile infection 06/2013  . Diabetes mellitus    takes Janumet daily  . Dyslipidemia    takes Crestor daily  . Gallstones   . GERD (gastroesophageal reflux disease)   . Heart murmur    hx of  . History of radiation therapy 06/11/17-06/21/17   right lung 50 Gy in 5 fractions  . Hypertension    takes Prinzide and Verapamil daily  . Impaired speech    from stroke  . Neuropathy, diabetic ([place])   . Pneumonia   . PONV (postoperative nausea and vomiting)   . Smoker   . Stroke ([place]) 06/25/13  . Vertigo    but doesn't take any meds    Patient Active Problem List   Diagnosis Date Noted  . Malignant neoplasm of lung ([place]) 07/04/2017  . Non-small cell lung cancer ([place]) 05/28/2017  . Pneumothorax after biopsy 05/23/2017  . Migraine equivalent 04/15/2017  . Smoker 04/15/2017  . Lung mass 02/16/2017  . Transient cerebral ischemia 02/15/2017  . Hyperlipidemia associated with type 2 diabetes mellitus ([place]) 03/07/2016  . Adenomatous colon polyp s/p laparoscopic right colectomy 09/02/15 09/02/2015  . Hemispheric carotid artery syndrome 04/07/2015  . Cataract associated with type 2 diabetes mellitus ([place]) 03/08/2015  . S/P recent Left carotid endarterectomy 07/14/2013  . History of CVA (cerebrovascular accident) 06/27/2013  . Hypertension associated with diabetes ([place]) 01/04/2011  . DM type 2 with diabetic dyslipidemia ([place]) 01/04/2011  . Light smoker 01/04/2011    Past Surgical History:  Procedure Laterality Date  . cataract removed Right   . CHOLECYSTECTOMY    . ENDARTERECTOMY Left 07/14/2013   Procedure: ENDARTERECTOMY CAROTID-LEFT;  Surgeon: Elam Dutch, MD;  Location: [facility];  Service: Vascular;  Laterality: Left;  . IR PERC PLEURAL DRAIN W/INDWELL CATH W/IMG GUIDE  05/23/2017  . KNEE SURGERY Left 21yr ago  . LAPAROSCOPIC  PARTIAL COLECTOMY N/A 09/02/2015   Procedure: LAPAROSCOPIC PARTIAL RIGHT COLECTOMY;  Surgeon: Leighton Ruff, MD;  Location: [facility];  Service: General;  Laterality: N/A;  . PATCH ANGIOPLASTY Left 07/14/2013   Procedure: LEFT CAROTID ARTERY PATCH ANGIOPLASTY;  Surgeon: Elam Dutch, MD;  Location: [facility];  Service: Vascular;  Laterality: Left;     OB History   None      Home Medications    Prior to Admission medications   Medication Sig Start Date End Date Taking? Authorizing Provider  Blood Glucose  Monitoring Suppl (ONE TOUCH ULTRA 2) w/Device KIT 1 Device by Does not apply route 2 (two) times daily. 08/21/17   [name], MD  clopidogrel (PLAVIX) 75 MG tablet TAKE 1 TABLET BY MOUTH  DAILY 12/12/17   [name], MD  divalproex (DEPAKOTE) 500 MG DR tablet Take 1 tablet (500 mg total) by mouth 2 (two) times daily. 01/24/18   Valarie Merino, MD  glucose blood (ONE TOUCH ULTRA TEST) test strip Use as instructed 08/21/17   [name], MD  JANUMET 50-1000 MG tablet TAKE 1 TABLET BY MOUTH TWICE DAILY 01/17/18   [name], MD  Lancets [facility] ULTRASOFT) lancets Use as instructed 08/21/17   [name], MD  lisinopril-hydrochlorothiazide (PRINZIDE,ZESTORETIC) 20-12.5 MG tablet TAKE 1 TABLET BY MOUTH  DAILY 12/11/17   [name], MD  rosuvastatin (CRESTOR) 20 MG tablet take 1 tablet by mouth once daily 02/27/17   [name], MD  sitaGLIPtin-metformin (JANUMET) 50-1000 MG tablet Take 1 tablet by mouth 2 (two) times daily. 11/13/17   [name], MD    Family History Family History  Problem Relation Age of Onset  . Leukemia Mother   . Diabetes Mother   . Cancer Father        esophageal ca  . Heart disease Father   . Diabetes Maternal Aunt   . Diabetes Maternal Uncle   . Diabetes Brother   . Diabetes Sister   . Heart attack Brother        x 2    Social History Social History   Tobacco Use  . Smoking status: Current Some Day Smoker    Packs/day: 0.50    Years: 60.00    Pack years: 30.00    Types: Cigarettes  . Smokeless tobacco: Never Used  Substance Use Topics  . Alcohol use: No    Alcohol/week: 0.0 oz  . Drug use: No     Allergies   Codeine; Lipitor [atorvastatin]; and Phenergan [promethazine hcl]   Review of Systems Review of Systems  Neurological: Positive for headaches.  All other systems reviewed and are negative.    Physical Exam Updated Vital Signs BP (!) 101/50   Pulse 80   Temp 97.9 F (36.6 C)   Resp (!) 32   SpO2 97%    Physical Exam  Constitutional: She is oriented to person, place, and time. She appears well-developed and well-nourished. No distress.  HENT:  Head: Normocephalic and atraumatic.  Mouth/Throat: Oropharynx is clear and moist.  Eyes: Pupils are equal, round, and reactive to light. Conjunctivae and EOM are normal.  Neck: Normal range of motion. Neck supple.  Cardiovascular: Normal rate, regular rhythm and normal heart sounds.  Pulmonary/Chest: Effort normal and breath sounds normal. No respiratory distress.  Abdominal: Soft. She exhibits no distension. There is no tenderness.  Musculoskeletal: Normal range of motion. She exhibits no edema or deformity.  Neurological: She is alert and  oriented to person, place, and time.  Alert and oriented x4 Normal speech No facial droop 5 out of 5 strength in both upper and lower extremities Normal gait   Skin: Skin is warm and dry.  Psychiatric: She has a normal mood and affect.  Nursing note and vitals reviewed.    ED Treatments / Results  Labs (all labs ordered are listed, but only abnormal results are displayed) Labs Reviewed  CBC - Abnormal; Notable for the following components:      Result Value   WBC 14.5 (*)    RBC 3.71 (*)    Hemoglobin 11.5 (*)    HCT 35.5 (*)    Platelets 571 (*)    All other components within normal limits  DIFFERENTIAL - Abnormal; Notable for the following components:   Neutro Abs 10.9 (*)    All other components within normal limits  COMPREHENSIVE METABOLIC PANEL - Abnormal; Notable for the following components:   Glucose, Bld 215 (*)    Creatinine, Ser 1.16 (*)    Albumin 3.4 (*)    AST 12 (*)    GFR calc non Af Amer 44 (*)    GFR calc Af Amer 51 (*)    All other components within normal limits  CBG MONITORING, ED - Abnormal; Notable for the following components:   Glucose-Capillary 124 (*)    All other components within normal limits  I-STAT CHEM 8, ED - Abnormal; Notable for the following  components:   Sodium 134 (*)    Chloride 97 (*)    Creatinine, Ser 1.10 (*)    Glucose, Bld 216 (*)    All other components within normal limits  PROTIME-INR  APTT  I-STAT TROPONIN, ED    EKG EKG Interpretation  Date/Time:  Friday January 24 2018 11:26:54 EDT Ventricular Rate:  99 PR Interval:  162 QRS Duration: 118 QT Interval:  360 QTC Calculation: 462 R Axis:   -53 Text Interpretation:  Sinus rhythm with occasional Premature ventricular complexes Incomplete right bundle branch block Left anterior fascicular block Inferior infarct , age undetermined Anterior infarct , age undetermined Abnormal ECG Confirmed by Dene Gentry [phone]) on 01/24/2018 1:06:16 PM   Radiology Ct Head Wo Contrast  Result Date: 01/24/2018 CLINICAL DATA:  Intermittent frontal headache with numbness of left hand. Blurred vision. EXAM: CT HEAD WITHOUT CONTRAST TECHNIQUE: Contiguous axial images were obtained from the base of the skull through the vertex without intravenous contrast. COMPARISON:  February 15, 2017 FINDINGS: Brain: No subdural, epidural, or subarachnoid hemorrhage. Cerebellum, brainstem, and basal cisterns are normal. Ventricles and sulci are unremarkable. The left frontal infarct is stable. No acute cortical ischemia or infarct noted. No mass effect or midline shift. No other acute abnormalities. Vascular: Calcified atherosclerosis in the intracranial carotids. Skull: Normal. Negative for fracture or focal lesion. Sinuses/Orbits: No acute finding. Other: None. IMPRESSION: Chronic left frontal infarct.  No acute intracranial abnormalities. Electronically Signed   By: Dorise Bullion III M.D   On: 01/24/2018 12:29    Procedures Procedures (including critical care time)  Medications Ordered in ED Medications  divalproex (DEPAKOTE) DR tablet 500 mg (has no administration in time range)     Initial Impression / Assessment and Plan / ED Course  I have reviewed the triage vital signs and the nursing  notes.  Pertinent labs & imaging results that were available during my care of the patient were reviewed by me and considered in my medical decision making (see chart for details).  MDM  Screen complete  She is presenting with complaint of headache and mild associated paresthesias of the left hand.  Symptoms are most consistent with likely migraine type headache.    Case discussed with neurology Rich Reining) who agrees with assessment.  Neurology suggests starting a course of Depakote (569m PO BID) for treatment of migraine.   After extensive discussion with patient, patient declines further work-up in ED. She declines MRI.  She finds the MRI machine very uncomfortable.  She was offered further observation and/or admission -she declined same.  She will trial Depakote at home for treatment of migraine.  She will follow-up with her already established outpatient neurology care.  Final Clinical Impressions(s) / ED Diagnoses   Final diagnoses:  Migraine without status migrainosus, not intractable, unspecified migraine type    ED Discharge Orders        Ordered    divalproex (DEPAKOTE) 500 MG DR tablet  2 times daily     01/24/18 1454       MValarie Merino MD 01/24/18 1530

## 2018-01-24 NOTE — ED Notes (Signed)
ED Provider at bedside. 

## 2018-01-24 NOTE — Discharge Instructions (Addendum)
Return for any problem.  Follow-up with your regular neurologist as instructed.  Please take Depakote 500 mg twice a day as a trial medication for relief of your migraine symptoms.

## 2018-01-27 ENCOUNTER — Ambulatory Visit: Payer: Medicare Other | Admitting: Medical

## 2018-02-10 ENCOUNTER — Other Ambulatory Visit: Payer: Self-pay

## 2018-02-10 ENCOUNTER — Telehealth: Payer: Self-pay | Admitting: Family Medicine

## 2018-02-10 MED ORDER — GLUCOSE BLOOD VI STRP: ORAL_STRIP | 12 refills | Status: AC

## 2018-02-10 NOTE — Telephone Encounter (Signed)
Recv'd fax request from [facility] for Test strips for One Touch Ultra Blue

## 2018-02-10 NOTE — Telephone Encounter (Signed)
Done KH 

## 2018-02-11 ENCOUNTER — Other Ambulatory Visit: Payer: Self-pay

## 2018-02-11 ENCOUNTER — Other Ambulatory Visit: Payer: Self-pay | Admitting: Family Medicine

## 2018-02-11 DIAGNOSIS — E1169 Type 2 diabetes mellitus with other specified complication: Secondary | ICD-10-CM

## 2018-02-11 DIAGNOSIS — E785 Hyperlipidemia, unspecified: Secondary | ICD-10-CM

## 2018-02-11 MED ORDER — ROSUVASTATIN CALCIUM 20 MG PO TABS: 20.0000 mg | ORAL_TABLET | Freq: Every day | ORAL | 3 refills | Status: DC

## 2018-02-11 NOTE — Telephone Encounter (Signed)
Fax was sent from pharmacy requesting a refill on Rosuvastatin. Please advise if this is appropriate.

## 2018-02-16 NOTE — Telephone Encounter (Signed)
complete

## 2018-02-18 ENCOUNTER — Ambulatory Visit (INDEPENDENT_AMBULATORY_CARE_PROVIDER_SITE_OTHER): Payer: Medicare Other | Admitting: Family Medicine

## 2018-02-18 ENCOUNTER — Encounter: Payer: Self-pay | Admitting: Family Medicine

## 2018-02-18 VITALS — BP 138/74 | HR 88 | Temp 98.0°F | Wt 136.0 lb

## 2018-02-18 DIAGNOSIS — Z8673 Personal history of transient ischemic attack (TIA), and cerebral infarction without residual deficits: Secondary | ICD-10-CM

## 2018-02-18 DIAGNOSIS — J329 Chronic sinusitis, unspecified: Secondary | ICD-10-CM

## 2018-02-18 DIAGNOSIS — R519 Headache, unspecified: Secondary | ICD-10-CM

## 2018-02-18 DIAGNOSIS — R51 Headache: Secondary | ICD-10-CM

## 2018-02-18 DIAGNOSIS — C3490 Malignant neoplasm of unspecified part of unspecified bronchus or lung: Secondary | ICD-10-CM

## 2018-02-18 MED ORDER — AMOXICILLIN-POT CLAVULANATE 875-125 MG PO TABS: 1.0000 | ORAL_TABLET | Freq: Two times a day (BID) | ORAL | 0 refills | Status: DC

## 2018-02-18 MED ORDER — PREDNISONE 10 MG (48) PO TBPK: ORAL_TABLET | ORAL | 0 refills | Status: DC

## 2018-02-18 NOTE — Progress Notes (Signed)
   Subjective:    Patient ID: [name], female    DOB: [birth year], 77 y.o.   MRN: [id number]  HPI She is here for follow-up visit.  She was seen July 12 in the emergency room and treated for left-sided headaches that at that time were intermittent in nature.  Review of the record indicates she did have a work-up for that approximately 1 year before that CT as well as MRI which was apparently negative.  She does have an underlying history of CVA.  She is taking aspirin as well as Plavix.  She does have underlying lung cancer.  She does complain of frontal type headache today as well as some slight earache on the left, nasal congestion, PND with some dizziness and nausea.  She was given Depakote in the ER and treated for presumed migraine however she did not get that filled.   Review of Systems     Objective:   Physical Exam Alert and in no distress.  Some tenderness to palpation over the maxillary sinuses.  Tympanic membranes and canals are normal. Pharyngeal area is normal. Neck is supple without adenopathy or thyromegaly. Cardiac exam shows a regular sinus rhythm without murmurs or gallops. Lungs are clear to auscultation.        Assessment & Plan:  Malignant neoplasm of lung, unspecified laterality, unspecified part of lung (HCC)  Nonintractable headache, unspecified chronicity pattern, unspecified headache type - Plan: predniSONE (STERAPRED UNI-PAK 48 TAB) 10 MG (48) TBPK tablet  Sinusitis, unspecified chronicity, unspecified location - Plan: amoxicillin-clavulanate (AUGMENTIN) 875-125 MG tablet, predniSONE (STERAPRED UNI-PAK 48 TAB) 10 MG (48) TBPK tablet  History of CVA (cerebrovascular accident) I explained that she has had a work-up for her headache recently and in the past and other than previously diagnosed CVA, no other CNS issues were noted.  Discussed the fact that this could possibly be a sinus infection although there is not clear-cut evidence to this.  We then also  discussed the possibility of getting another MRI.  She has agreed to try antibiotics as well as steroid to see if this will help with her symptoms.  If this has no effect and I think we will need to order an MRI.

## 2018-02-18 NOTE — Patient Instructions (Signed)
You can take 2 Tylenol 4 times per day as needed

## 2018-02-25 ENCOUNTER — Other Ambulatory Visit: Payer: Self-pay | Admitting: Family Medicine

## 2018-02-25 DIAGNOSIS — E785 Hyperlipidemia, unspecified: Secondary | ICD-10-CM

## 2018-02-25 DIAGNOSIS — E1169 Type 2 diabetes mellitus with other specified complication: Secondary | ICD-10-CM

## 2018-02-25 MED ORDER — ROSUVASTATIN CALCIUM 20 MG PO TABS: 20.0000 mg | ORAL_TABLET | Freq: Every day | ORAL | 3 refills | Status: DC

## 2018-02-25 NOTE — Progress Notes (Signed)
[facility] called needing a refill on crestor 20mg  sent to mail order

## 2018-02-26 ENCOUNTER — Telehealth: Payer: Self-pay | Admitting: Family Medicine

## 2018-02-26 ENCOUNTER — Other Ambulatory Visit: Payer: Self-pay

## 2018-02-26 NOTE — Telephone Encounter (Signed)
Lets order an MRI

## 2018-02-26 NOTE — Telephone Encounter (Signed)
Please put order in not sure if it with or with out contrast. [facility]

## 2018-02-26 NOTE — Telephone Encounter (Signed)
Pt called and stated she is still having issues with being dizzy and nausea. She also states she feels like she has a fluttering in her throat. Pt uses [facility] on [address] In [place] and can be reached at [phone].

## 2018-02-27 ENCOUNTER — Observation Stay (HOSPITAL_COMMUNITY)
Admission: EM | Admit: 2018-02-27 | Discharge: 2018-03-02 | Disposition: A | Discharge status: Home / Self Care | Payer: Medicare Other | Attending: Internal Medicine | Admitting: Internal Medicine

## 2018-02-27 ENCOUNTER — Other Ambulatory Visit: Payer: Self-pay

## 2018-02-27 ENCOUNTER — Emergency Department (HOSPITAL_COMMUNITY): Payer: Medicare Other

## 2018-02-27 ENCOUNTER — Observation Stay (HOSPITAL_COMMUNITY): Payer: Medicare Other

## 2018-02-27 ENCOUNTER — Encounter (HOSPITAL_COMMUNITY): Payer: Self-pay

## 2018-02-27 DIAGNOSIS — R51 Headache: Principal | ICD-10-CM

## 2018-02-27 DIAGNOSIS — I152 Hypertension secondary to endocrine disorders: Secondary | ICD-10-CM | POA: Diagnosis not present

## 2018-02-27 DIAGNOSIS — K219 Gastro-esophageal reflux disease without esophagitis: Secondary | ICD-10-CM | POA: Insufficient documentation

## 2018-02-27 DIAGNOSIS — Z923 Personal history of irradiation: Secondary | ICD-10-CM | POA: Diagnosis not present

## 2018-02-27 DIAGNOSIS — F17210 Nicotine dependence, cigarettes, uncomplicated: Secondary | ICD-10-CM | POA: Diagnosis present

## 2018-02-27 DIAGNOSIS — E785 Hyperlipidemia, unspecified: Secondary | ICD-10-CM | POA: Insufficient documentation

## 2018-02-27 DIAGNOSIS — N179 Acute kidney failure, unspecified: Secondary | ICD-10-CM | POA: Diagnosis not present

## 2018-02-27 DIAGNOSIS — E1140 Type 2 diabetes mellitus with diabetic neuropathy, unspecified: Secondary | ICD-10-CM | POA: Diagnosis not present

## 2018-02-27 DIAGNOSIS — C3490 Malignant neoplasm of unspecified part of unspecified bronchus or lung: Secondary | ICD-10-CM | POA: Insufficient documentation

## 2018-02-27 DIAGNOSIS — R42 Dizziness and giddiness: Secondary | ICD-10-CM

## 2018-02-27 DIAGNOSIS — Z885 Allergy status to narcotic agent status: Secondary | ICD-10-CM | POA: Insufficient documentation

## 2018-02-27 DIAGNOSIS — Z7901 Long term (current) use of anticoagulants: Secondary | ICD-10-CM | POA: Insufficient documentation

## 2018-02-27 DIAGNOSIS — Z8673 Personal history of transient ischemic attack (TIA), and cerebral infarction without residual deficits: Secondary | ICD-10-CM | POA: Diagnosis not present

## 2018-02-27 DIAGNOSIS — R918 Other nonspecific abnormal finding of lung field: Secondary | ICD-10-CM | POA: Diagnosis not present

## 2018-02-27 DIAGNOSIS — E440 Moderate protein-calorie malnutrition: Secondary | ICD-10-CM | POA: Diagnosis not present

## 2018-02-27 DIAGNOSIS — E1169 Type 2 diabetes mellitus with other specified complication: Secondary | ICD-10-CM | POA: Diagnosis not present

## 2018-02-27 DIAGNOSIS — I10 Essential (primary) hypertension: Secondary | ICD-10-CM

## 2018-02-27 DIAGNOSIS — Z6821 Body mass index (BMI) 21.0-21.9, adult: Secondary | ICD-10-CM | POA: Diagnosis not present

## 2018-02-27 DIAGNOSIS — I69328 Other speech and language deficits following cerebral infarction: Secondary | ICD-10-CM | POA: Diagnosis not present

## 2018-02-27 DIAGNOSIS — R519 Headache, unspecified: Secondary | ICD-10-CM

## 2018-02-27 DIAGNOSIS — E860 Dehydration: Principal | ICD-10-CM | POA: Insufficient documentation

## 2018-02-27 DIAGNOSIS — Z888 Allergy status to other drugs, medicaments and biological substances status: Secondary | ICD-10-CM | POA: Insufficient documentation

## 2018-02-27 DIAGNOSIS — Z79899 Other long term (current) drug therapy: Secondary | ICD-10-CM | POA: Diagnosis not present

## 2018-02-27 DIAGNOSIS — Z9889 Other specified postprocedural states: Secondary | ICD-10-CM

## 2018-02-27 DIAGNOSIS — E1159 Type 2 diabetes mellitus with other circulatory complications: Secondary | ICD-10-CM | POA: Diagnosis present

## 2018-02-27 DIAGNOSIS — R110 Nausea: Secondary | ICD-10-CM

## 2018-02-27 LAB — BASIC METABOLIC PANEL
Anion gap: 13 (ref 5–15)
BUN: 45 mg/dL — AB (ref 8–23)
CO2: 24 mmol/L (ref 22–32)
Calcium: 9.4 mg/dL (ref 8.9–10.3)
Chloride: 99 mmol/L (ref 98–111)
Creatinine, Ser: 1.69 mg/dL — ABNORMAL HIGH (ref 0.44–1.00)
GFR calc Af Amer: 33 mL/min — ABNORMAL LOW (ref >60)
GFR, EST NON AFRICAN AMERICAN: 28 mL/min — AB (ref >60)
GLUCOSE: 228 mg/dL — AB (ref 70–99)
POTASSIUM: 3.8 mmol/L (ref 3.5–5.1)
SODIUM: 136 mmol/L (ref 135–145)

## 2018-02-27 LAB — I-STAT TROPONIN, ED: Troponin i, poc: 0.01 ng/mL (ref 0.00–0.08)

## 2018-02-27 LAB — GLUCOSE, CAPILLARY
GLUCOSE-CAPILLARY: 226 mg/dL — AB (ref 70–99)
Glucose-Capillary: 270 mg/dL — ABNORMAL HIGH (ref 70–99)

## 2018-02-27 LAB — CBC
HEMATOCRIT: 37.2 % (ref 36.0–46.0)
HEMOGLOBIN: 12 g/dL (ref 12.0–15.0)
MCH: 29.9 pg (ref 26.0–34.0)
MCHC: 32.3 g/dL (ref 30.0–36.0)
MCV: 92.8 fL (ref 78.0–100.0)
Platelets: 551 10*3/uL — ABNORMAL HIGH (ref 150–400)
RBC: 4.01 MIL/uL (ref 3.87–5.11)
RDW: 14.7 % (ref 11.5–15.5)
WBC: 23.3 10*3/uL — AB (ref 4.0–10.5)

## 2018-02-27 MED ORDER — SODIUM CHLORIDE 0.9% FLUSH
3.0000 mL | Freq: Two times a day (BID) | INTRAVENOUS | Status: DC
  Administered 2018-02-28 (×2): 3 mL via INTRAVENOUS

## 2018-02-27 MED ORDER — ONDANSETRON HCL 4 MG PO TABS: 4.0000 mg | ORAL_TABLET | Freq: Four times a day (QID) | ORAL | Status: DC | PRN

## 2018-02-27 MED ORDER — SODIUM CHLORIDE 0.9 % IV BOLUS
1000.0000 mL | Freq: Once | INTRAVENOUS | Status: AC
  Administered 2018-02-27: 1000 mL via INTRAVENOUS

## 2018-02-27 MED ORDER — ROSUVASTATIN CALCIUM 20 MG PO TABS
20.0000 mg | ORAL_TABLET | Freq: Every day | ORAL | Status: DC
  Administered 2018-02-27 – 2018-03-02 (×4): 20 mg via ORAL
  Filled 2018-02-27 (×4): qty 1

## 2018-02-27 MED ORDER — NICOTINE 21 MG/24HR TD PT24
21.0000 mg | MEDICATED_PATCH | Freq: Every day | TRANSDERMAL | Status: DC
  Administered 2018-02-27 – 2018-03-02 (×4): 21 mg via TRANSDERMAL
  Filled 2018-02-27 (×4): qty 1

## 2018-02-27 MED ORDER — ALBUTEROL SULFATE (2.5 MG/3ML) 0.083% IN NEBU: 2.5000 mg | INHALATION_SOLUTION | RESPIRATORY_TRACT | Status: DC | PRN

## 2018-02-27 MED ORDER — SODIUM CHLORIDE 0.9 % IV SOLN
INTRAVENOUS | Status: DC
Start: 2018-02-27 — End: 2018-03-02
  Administered 2018-02-28 – 2018-03-01 (×3): via INTRAVENOUS

## 2018-02-27 MED ORDER — HEPARIN SODIUM (PORCINE) 5000 UNIT/ML IJ SOLN
5000.0000 [IU] | Freq: Three times a day (TID) | INTRAMUSCULAR | Status: DC
  Administered 2018-02-27 – 2018-03-02 (×8): 5000 [IU] via SUBCUTANEOUS
  Filled 2018-02-27 (×6): qty 1

## 2018-02-27 MED ORDER — SODIUM CHLORIDE 0.9 % IV SOLN: INTRAVENOUS | Status: DC

## 2018-02-27 MED ORDER — INSULIN ASPART 100 UNIT/ML ~~LOC~~ SOLN
0.0000 [IU] | Freq: Three times a day (TID) | SUBCUTANEOUS | Status: DC
  Administered 2018-02-28: 3 [IU] via SUBCUTANEOUS
  Administered 2018-02-28: 2 [IU] via SUBCUTANEOUS
  Administered 2018-02-28 – 2018-03-01 (×2): 5 [IU] via SUBCUTANEOUS
  Administered 2018-03-01: 3 [IU] via SUBCUTANEOUS
  Administered 2018-03-01: 7 [IU] via SUBCUTANEOUS
  Administered 2018-03-02: 5 [IU] via SUBCUTANEOUS
  Administered 2018-03-02: 7 [IU] via SUBCUTANEOUS

## 2018-02-27 MED ORDER — GUAIFENESIN ER 600 MG PO TB12
600.0000 mg | ORAL_TABLET | Freq: Two times a day (BID) | ORAL | Status: DC
  Administered 2018-02-27 – 2018-03-02 (×6): 600 mg via ORAL
  Filled 2018-02-27 (×6): qty 1

## 2018-02-27 MED ORDER — ACETAMINOPHEN 325 MG PO TABS: 650.0000 mg | ORAL_TABLET | Freq: Four times a day (QID) | ORAL | Status: DC | PRN

## 2018-02-27 MED ORDER — SODIUM CHLORIDE 0.9 % IV SOLN
INTRAVENOUS | Status: DC
  Administered 2018-02-27: 16:00:00 via INTRAVENOUS

## 2018-02-27 MED ORDER — ONDANSETRON HCL 4 MG/2ML IJ SOLN: 4.0000 mg | Freq: Four times a day (QID) | INTRAMUSCULAR | Status: DC | PRN

## 2018-02-27 MED ORDER — CLOPIDOGREL BISULFATE 75 MG PO TABS
75.0000 mg | ORAL_TABLET | Freq: Every day | ORAL | Status: DC
  Administered 2018-02-28 – 2018-03-02 (×3): 75 mg via ORAL
  Filled 2018-02-27 (×3): qty 1

## 2018-02-27 MED ORDER — ACETAMINOPHEN 650 MG RE SUPP: 650.0000 mg | Freq: Four times a day (QID) | RECTAL | Status: DC | PRN

## 2018-02-27 MED ORDER — SODIUM CHLORIDE 0.9 % IV BOLUS: 250.0000 mL | Freq: Once | INTRAVENOUS | Status: DC

## 2018-02-27 NOTE — H&P (Signed)
TRH H&P   Patient Demographics:    [name], is a 77 y.o. female  MRN: [id number]   DOB - June 26, 1941  Admit Date - 02/27/2018  Outpatient Primary MD for the patient is [name], MD  Referring MD/NP/PA: Dr Zenia Resides  Patient coming from: Home  Chief Complaint  Patient presents with  . Dizziness      HPI:    [name]  is a 77 y.o. female, past medical history of hypertension, diabetes mellitus, hyperlipidemia, carotid artery stenosis status post endarterectomy, tobacco abuse, C. difficile in the past, and diagnosis of squamous cell carcinoma of right upper lung, no evidence of meds, refused surgery, post XRT by Dr. Sondra Come finished last March, patient presents with multiple complaints has been building up over the last few months, mainly nausea, decreased appetite and oral intake, nausea, but no vomiting, thickened weight loss around 15 pounds over last 4 months, currently she has been having worsening dizziness/lightheadedness as well, has been prescribed steroids and Augmentin by her PCP presumed sinusitis, reports cough, chronic, with white productive sputum. -ED she was orthostatic 87/55, responded to fluid bolus, she had leukocytosis of [address], but afebrile, she was still taking prednisone taper, Augmentin, chest x-ray for right upper lung mass versus infection, creatinine elevated at 1.69, elevated BUN at 45, to admit.    Review of systems:    In addition to the HPI above,  No Fever-chills, but she reported generalized weakness, fatigue, loss of appetite and weight loss Reports some headaches, dizziness, no no changes with Vision or hearing, No problems swallowing food or Liquids, No Chest pain, Cough or Shortness of Breath, No Abdominal pain, reports nausea, but no Vommitting, Bowel movements are regular, No Blood in stool or Urine, No dysuria, No new skin rashes  or bruises, No new joints pains-aches,  No new weakness, tingling, numbness in any extremity, No recent weight gain or loss, No polyuria, polydypsia or polyphagia, No significant Mental Stressors.  A full 10 point Review of Systems was done, except as stated above, all other Review of Systems were negative.   With Past History of the following :    Past Medical History:  Diagnosis Date  . Carotid artery occlusion   . Clostridium difficile infection 06/2013  . Diabetes mellitus    takes Janumet daily  . Dyslipidemia    takes Crestor daily  . Gallstones   . GERD (gastroesophageal reflux disease)   . Heart murmur    hx of  . History of radiation therapy 06/11/17-06/21/17   right lung 50 Gy in 5 fractions  . Hypertension    takes Prinzide and Verapamil daily  . Impaired speech    from stroke  . Neuropathy, diabetic ([place])   . Pneumonia   . PONV (postoperative nausea and vomiting)   . Smoker   . Stroke ([place]) 06/25/13  . Vertigo  but doesn't take any meds      Past Surgical History:  Procedure Laterality Date  . cataract removed Right   . CHOLECYSTECTOMY    . ENDARTERECTOMY Left 07/14/2013   Procedure: ENDARTERECTOMY CAROTID-LEFT;  Surgeon: Elam Dutch, MD;  Location: [facility];  Service: Vascular;  Laterality: Left;  . IR PERC PLEURAL DRAIN W/INDWELL CATH W/IMG GUIDE  05/23/2017  . KNEE SURGERY Left 1yr ago  . LAPAROSCOPIC PARTIAL COLECTOMY N/A 09/02/2015   Procedure: LAPAROSCOPIC PARTIAL RIGHT COLECTOMY;  Surgeon: ALeighton Ruff MD;  [facility];  Service: General;  Laterality: N/A;  . PATCH ANGIOPLASTY Left 07/14/2013   Procedure: LEFT CAROTID ARTERY PATCH ANGIOPLASTY;  Surgeon: CElam Dutch MD;  Location[place] [facility]  Service: Vascular;  Laterality: Left;      Social History:     Social History   Tobacco Use  . Smoking status: Current Every Day Smoker    Packs/day: 0.50    Years: 60.00    Pack years: 30.00    Types: Cigarettes  . Smokeless  tobacco: Never Used  Substance Use Topics  . Alcohol use: No    Alcohol/week: 0.0 standard drinks     Lives -at home  Mobility -dependent     Family History :     Family History  Problem Relation Age of Onset  . Leukemia Mother   . Diabetes Mother   . Cancer Father        esophageal ca  . Heart disease Father   . Diabetes Maternal Aunt   . Diabetes Maternal Uncle   . Diabetes Brother   . Diabetes Sister   . Heart attack Brother        x 2      Home Medications:   Prior to Admission medications   Medication Sig Start Date End Date Taking? Authorizing Provider  acetaminophen (TYLENOL) 325 MG tablet Take 650 mg by mouth every 6 (six) hours as needed for mild pain.   Yes [provider]  amoxicillin-clavulanate (AUGMENTIN) 875-125 MG tablet Take 1 tablet by mouth 2 (two) times daily. 02/18/18  Yes LDenita Lung MD  clopidogrel (PLAVIX) 75 MG tablet TAKE 1 TABLET BY MOUTH  DAILY 12/12/17  Yes LDenita Lung MD  lisinopril-hydrochlorothiazide (PRINZIDE,ZESTORETIC) 20-12.5 MG tablet TAKE 1 TABLET BY MOUTH  DAILY 02/11/18  Yes LDenita Lung MD  predniSONE (STERAPRED UNI-PAK 48 TAB) 10 MG (48) TBPK tablet Take as per manufacturer's recommendation 02/18/18  Yes LDenita Lung MD  rosuvastatin (CRESTOR) 20 MG tablet Take 1 tablet (20 mg total) by mouth daily. 02/25/18  Yes LDenita Lung MD  sitaGLIPtin-metformin (JANUMET) 50-1000 MG tablet Take 1 tablet by mouth 2 (two) times daily. 11/13/17  Yes LDenita Lung MD  Blood Glucose Monitoring Suppl (ONE TOUCH ULTRA 2) w/Device KIT 1 Device by Does not apply route 2 (two) times daily. 08/21/17   LDenita Lung MD  divalproex (DEPAKOTE) 500 MG DR tablet Take 1 tablet (500 mg total) by mouth 2 (two) times daily. Patient not taking: Reported on 02/18/2018 01/24/18   MValarie Merino MD  glucose blood (ONE TOUCH ULTRA TEST) test strip Use as instructed 02/10/18   LDenita Lung MD  Lancets ([facility] lancets Use as  instructed 08/21/17   LDenita Lung MD     Allergies:     Allergies  Allergen Reactions  . Codeine Nausea And Vomiting  . Lipitor [Atorvastatin] Nausea And Vomiting  . Phenergan [Promethazine  Hcl] Other (See Comments)    Confusion, hallucinations, severe agitation     Physical Exam:   Vitals  Blood pressure 102/67, pulse 80, temperature 97.6 F (36.4 C), temperature source Oral, resp. rate 17, height '5\' 6"'  (1.676 m), weight 60.3 kg, SpO2 100 %.   1. General appearing frail female laying in bed in no apparent distress  2. Normal affect and insight, Not Suicidal or Homicidal, Awake Alert, Oriented X 3.  3. No F.N deficits, ALL C.Nerves Intact, Strength 5/5 all 4 extremities, Sensation intact all 4 extremities, Plantars down going.  4. Ears and Eyes appear Normal, Conjunctivae clear, PERRLA. Moist Oral Mucosa.  5. Supple Neck, No JVD, No cervical lymphadenopathy appriciated, No Carotid Bruits.  6. Symmetrical Chest wall movement, Good air movement bilaterally, CTAB.  7. RRR, No Gallops, Rubs or Murmurs, No Parasternal Heave.  8. Positive Bowel Sounds, Abdomen Soft, No tenderness, No organomegaly appriciated,No rebound -guarding or rigidity.  9.  No Cyanosis, Normal Skin Turgor, No Skin Rash or Bruise.  10. Good muscle tone,  joints appear normal , no effusions, Normal ROM.  11. No Palpable Lymph Nodes in Neck or Axillae     Data Review:    CBC Recent Labs  Lab 02/27/18 1358  WBC 23.3*  HGB 12.0  HCT 37.2  PLT 551*  MCV 92.8  MCH 29.9  MCHC 32.3  RDW 14.7   ------------------------------------------------------------------------------------------------------------------  Chemistries  Recent Labs  Lab 02/27/18 1358  NA 136  K 3.8  CL 99  CO2 24  GLUCOSE 228*  BUN 45*  CREATININE 1.69*  CALCIUM 9.4   ------------------------------------------------------------------------------------------------------------------ estimated creatinine clearance  is 26.1 mL/min (A) (by C-G formula based on SCr of 1.69 mg/dL (H)). ------------------------------------------------------------------------------------------------------------------ No results for input(s): TSH, T4TOTAL, T3FREE, THYROIDAB in the last 72 hours.  Invalid input(s): FREET3  Coagulation profile No results for input(s): INR, PROTIME in the last 168 hours. ------------------------------------------------------------------------------------------------------------------- No results for input(s): DDIMER in the last 72 hours. -------------------------------------------------------------------------------------------------------------------  Cardiac Enzymes No results for input(s): CKMB, TROPONINI, MYOGLOBIN in the last 168 hours.  Invalid input(s): CK ------------------------------------------------------------------------------------------------------------------ No results found for: BNP   ---------------------------------------------------------------------------------------------------------------  Urinalysis    Component Value Date/Time   COLORURINE YELLOW 07/25/2013 [place] 07/25/2013 0952   LABSPEC 1.020 07/04/2017 1051   PHURINE 6.5 07/25/2013 0952   GLUCOSEU >1000 (A) 07/25/2013 0952   HGBUR NEGATIVE 07/25/2013 0952   BILIRUBINUR negative 07/04/2017 1051   KETONESUR negative 07/04/2017 1051   KETONESUR 15 (A) 07/25/2013 0952   PROTEINUR negative 07/04/2017 1051   PROTEINUR 30 (A) 07/25/2013 0952   UROBILINOGEN 0.2 07/25/2013 0952   NITRITE Negative 07/04/2017 1051   NITRITE NEGATIVE 07/25/2013 0952   LEUKOCYTESUR Negative 07/04/2017 1051    ----------------------------------------------------------------------------------------------------------------   Imaging Results:    Dg Chest 2 View  Result Date: 02/27/2018 CLINICAL DATA:  Dizziness and weight loss. Current smoker. History of lung cancer. EXAM: CHEST - 2 VIEW COMPARISON:  CT  scan October 28, 2017 FINDINGS: There is increased opacity in the right upper lung at the site of the patient's known malignancy. The opacity is somewhat rounded and masslike on the lateral view. This represents a change compared to the October 28, 2017 chest CT scout film. No pneumothorax. No other interval changes. IMPRESSION: Increasing opacity with a somewhat masslike component the right upper lobe with the site of the patient's known malignancy. Whether this represents worsening malignancy, post treatment change around the known malignancy, or a postobstructive infection due  to the malignancy is unclear on this x-ray. A CT scan may better evaluate. Electronically Signed   By: Dorise Bullion III M.D   On: 02/27/2018 15:23    My personal review of EKG: Rhythm NSR, right bundle branch block, rate  98 /min, QTc 451 , no Acute ST changes   Assessment & Plan:    Active Problems:   Hypertension associated with diabetes ([place])   DM type 2 with diabetic dyslipidemia ([place])   Light smoker   History of CVA (cerebrovascular accident)   S/P recent Left carotid endarterectomy   Lung mass   Malignant neoplasm of lung ([place])   AKI (acute kidney injury) ([place])   Lightheadedness/hypotension -Her main complaints was lightheadedness, he was hypotensive in ED, this is clearly in the setting of dehydration, volume depletion, while using lisinopril/hydrochlorothiazide, she was given IV fluids with good response, I will hold her antihypertensive meds,. -She reports weight loss and poor oral intake, will consult nutritionist.  History of lung cancer -Patient finished radiation therapy last March, she having weight loss, nausea, chronic cough, x-ray with worsening opacity, clinically does not appear to be having pneumonia, her leukocytosis most likely to be due to recent use of steroids, initiating antibiotics especially with her history of C. difficile, I will proceed with CT chest for further evaluation, if evidence of  infection then will start on antibiotics.  Leukocytosis -He is nontoxic-appearing, this is most likely in the setting of recent steroid use, no antibiotics patient with history of C. difficile in the past, follow on CT chest  Headache/migraine -Is been progressive complaints over last 4 months, I will obtain MRI brain to rule out any mets.  AKI -In the setting of volume depletion/dehydration while using lisinopril/hydrochlorothiazide, will hold these meds and start on IV fluids.  Hypertension -She is hypotensive on presentation, hold home meds  CVA/left carotid disease -Endarterectomy, continue with Plavix  Diabetes mellitus -Hold Home meds, continue with sliding scale  Tobacco abuse -Counseled, started nicotine patch  DVT Prophylaxis Heparin - SCDs  AM Labs Ordered, also please review Full Orders  Family Communication: Admission, patients condition and plan of care including tests being ordered have been discussed with the patient and both daughters who indicate understanding and agree with the plan and Code Status.  Code Status Full  Likely DC to  Home  Condition GUARDED    Consults called: None  Admission status: Observation  Time spent in minutes : 60 minutes   Phillips Climes M.D on 02/27/2018 at 5:16 PM  Between 7am to 7pm - Pager - [phone]. After 7pm go to [email or web address] - password [facility]  [facility] - Office  [phone]

## 2018-02-27 NOTE — ED Provider Notes (Addendum)
[facility] EMERGENCY DEPARTMENT Provider Note   CSN: [id number] Arrival date & time: 02/27/18  1259     History   Chief Complaint Chief Complaint  Patient presents with  . Dizziness    HPI [name] is a 77 y.o. female.  77 year old female presents with dizziness times several weeks that became worse over the past 2 days.  Symptoms are worse when she tries to stand up and feels like she is going to pass out.  Does have history of vertigo but this is different.  Patient denies any severe headaches with this.  No recent history of blood loss.  No vomiting or diarrhea.  No associate abdominal chest discomfort.  Has been very nauseated which is preclude her from eating.  No associated abdominal discomfort.  No fever chills.  Presents here for further management     Past Medical History:  Diagnosis Date  . Carotid artery occlusion   . Clostridium difficile infection 06/2013  . Diabetes mellitus    takes Janumet daily  . Dyslipidemia    takes Crestor daily  . Gallstones   . GERD (gastroesophageal reflux disease)   . Heart murmur    hx of  . History of radiation therapy 06/11/17-06/21/17   right lung 50 Gy in 5 fractions  . Hypertension    takes Prinzide and Verapamil daily  . Impaired speech    from stroke  . Neuropathy, diabetic ([place])   . Pneumonia   . PONV (postoperative nausea and vomiting)   . Smoker   . Stroke ([place]) 06/25/13  . Vertigo    but doesn't take any meds    Patient Active Problem List   Diagnosis Date Noted  . Malignant neoplasm of lung ([place]) 07/04/2017  . Non-small cell lung cancer ([place]) 05/28/2017  . Pneumothorax after biopsy 05/23/2017  . Migraine equivalent 04/15/2017  . Smoker 04/15/2017  . Lung mass 02/16/2017  . Transient cerebral ischemia 02/15/2017  . Hyperlipidemia associated with type 2 diabetes mellitus ([place]) 03/07/2016  . Adenomatous colon polyp s/p laparoscopic right colectomy 09/02/15 09/02/2015  .  Hemispheric carotid artery syndrome 04/07/2015  . Cataract associated with type 2 diabetes mellitus ([place]) 03/08/2015  . S/P recent Left carotid endarterectomy 07/14/2013  . History of CVA (cerebrovascular accident) 06/27/2013  . Hypertension associated with diabetes ([place]) 01/04/2011  . DM type 2 with diabetic dyslipidemia ([place]) 01/04/2011  . Light smoker 01/04/2011    Past Surgical History:  Procedure Laterality Date  . cataract removed Right   . CHOLECYSTECTOMY    . ENDARTERECTOMY Left 07/14/2013   Procedure: ENDARTERECTOMY CAROTID-LEFT;  Surgeon: Elam Dutch, MD;  Location: [facility];  Service: Vascular;  Laterality: Left;  . IR PERC PLEURAL DRAIN W/INDWELL CATH W/IMG GUIDE  05/23/2017  . KNEE SURGERY Left 73yr ago  . LAPAROSCOPIC PARTIAL COLECTOMY N/A 09/02/2015   Procedure: LAPAROSCOPIC PARTIAL RIGHT COLECTOMY;  Surgeon: ALeighton Ruff MD;  [facility];  Service: General;  Laterality: N/A;  . PATCH ANGIOPLASTY Left 07/14/2013   Procedure: LEFT CAROTID ARTERY PATCH ANGIOPLASTY;  Surgeon: CElam Dutch MD;  [facility]  Service: Vascular;  Laterality: Left;     OB History   None      Home Medications    Prior to Admission medications   Medication Sig Start Date End Date Taking? Authorizing Provider  amoxicillin-clavulanate (AUGMENTIN) 875-125 MG tablet Take 1 tablet by mouth 2 (two) times daily. 02/18/18  Yes LDenita Lung MD  Blood Glucose  Monitoring Suppl (ONE TOUCH ULTRA 2) w/Device KIT 1 Device by Does not apply route 2 (two) times daily. 08/21/17   [name], MD  clopidogrel (PLAVIX) 75 MG tablet TAKE 1 TABLET BY MOUTH  DAILY 12/12/17   [name], MD  divalproex (DEPAKOTE) 500 MG DR tablet Take 1 tablet (500 mg total) by mouth 2 (two) times daily. Patient not taking: Reported on 02/18/2018 01/24/18   Valarie Merino, MD  glucose blood (ONE TOUCH ULTRA TEST) test strip Use as instructed 02/10/18   [name], MD  Lancets [facility] ULTRASOFT)  lancets Use as instructed 08/21/17   [name], MD  lisinopril-hydrochlorothiazide (PRINZIDE,ZESTORETIC) 20-12.5 MG tablet TAKE 1 TABLET BY MOUTH  DAILY 02/11/18   [name], MD  predniSONE (STERAPRED UNI-PAK 48 TAB) 10 MG (48) TBPK tablet Take as per manufacturer's recommendation 02/18/18   [name], MD  rosuvastatin (CRESTOR) 20 MG tablet Take 1 tablet (20 mg total) by mouth daily. 02/25/18   [name], MD  sitaGLIPtin-metformin (JANUMET) 50-1000 MG tablet Take 1 tablet by mouth 2 (two) times daily. 11/13/17   [name], MD    Family History Family History  Problem Relation Age of Onset  . Leukemia Mother   . Diabetes Mother   . Cancer Father        esophageal ca  . Heart disease Father   . Diabetes Maternal Aunt   . Diabetes Maternal Uncle   . Diabetes Brother   . Diabetes Sister   . Heart attack Brother        x 2    Social History Social History   Tobacco Use  . Smoking status: Current Every Day Smoker    Packs/day: 0.50    Years: 60.00    Pack years: 30.00    Types: Cigarettes  . Smokeless tobacco: Never Used  Substance Use Topics  . Alcohol use: No    Alcohol/week: 0.0 standard drinks  . Drug use: No     Allergies   Codeine; Lipitor [atorvastatin]; and Phenergan [promethazine hcl]   Review of Systems Review of Systems  All other systems reviewed and are negative.    Physical Exam Updated Vital Signs BP (!) 98/55   Pulse 84   Temp 97.6 F (36.4 C) (Oral)   Resp (!) 21   Ht 1.676 m ('5\' 6"' )   Wt 60.3 kg   SpO2 99%   BMI 21.47 kg/m   Physical Exam  Constitutional: She is oriented to person, place, and time. She appears well-developed and well-nourished.  Non-toxic appearance. No distress.  HENT:  Head: Normocephalic and atraumatic.  Eyes: Pupils are equal, round, and reactive to light. Conjunctivae, EOM and lids are normal.  Neck: Normal range of motion. Neck supple. No tracheal deviation present. No thyroid mass present.    Cardiovascular: Normal rate, regular rhythm and normal heart sounds. Exam reveals no gallop.  No murmur heard. Pulmonary/Chest: Effort normal and breath sounds normal. No stridor. No respiratory distress. She has no decreased breath sounds. She has no wheezes. She has no rhonchi. She has no rales.  Abdominal: Soft. Normal appearance and bowel sounds are normal. She exhibits no distension. There is no tenderness. There is no rebound and no CVA tenderness.  Musculoskeletal: Normal range of motion. She exhibits no edema or tenderness.  Neurological: She is alert and oriented to person, place, and time. She has normal strength. No cranial nerve deficit or sensory deficit. GCS eye  subscore is 4. GCS verbal subscore is 5. GCS motor subscore is 6.  Skin: Skin is warm and dry. No abrasion and no rash noted.  Psychiatric: She has a normal mood and affect. Her speech is normal and behavior is normal.  Nursing note and vitals reviewed.    ED Treatments / Results  Labs (all labs ordered are listed, but only abnormal results are displayed) Labs Reviewed  BASIC METABOLIC PANEL - Abnormal; Notable for the following components:      Result Value   Glucose, Bld 228 (*)    BUN 45 (*)    Creatinine, Ser 1.69 (*)    GFR calc non Af Amer 28 (*)    GFR calc Af Amer 33 (*)    All other components within normal limits  CBC - Abnormal; Notable for the following components:   WBC 23.3 (*)    Platelets 551 (*)    All other components within normal limits  I-STAT TROPONIN, ED    EKG EKG Interpretation  Date/Time:  Thursday February 27 2018 13:55:46 EDT Ventricular Rate:  98 PR Interval:  146 QRS Duration: 120 QT Interval:  354 QTC Calculation: 451 R Axis:   -44 Text Interpretation:  Normal sinus rhythm Left axis deviation Right bundle branch block Minimal voltage criteria for LVH, may be normal variant Possible Inferior infarct , age undetermined Anterolateral infarct , age undetermined Abnormal ECG  Confirmed by Lacretia Leigh [id number]) on 02/27/2018 4:09:29 PM   Radiology Dg Chest 2 View  Result Date: 02/27/2018 CLINICAL DATA:  Dizziness and weight loss. Current smoker. History of lung cancer. EXAM: CHEST - 2 VIEW COMPARISON:  CT scan October 28, 2017 FINDINGS: There is increased opacity in the right upper lung at the site of the patient's known malignancy. The opacity is somewhat rounded and masslike on the lateral view. This represents a change compared to the October 28, 2017 chest CT scout film. No pneumothorax. No other interval changes. IMPRESSION: Increasing opacity with a somewhat masslike component the right upper lobe with the site of the patient's known malignancy. Whether this represents worsening malignancy, post treatment change around the known malignancy, or a postobstructive infection due to the malignancy is unclear on this x-ray. A CT scan may better evaluate. Electronically Signed   By: Dorise Bullion III M.D   On: 02/27/2018 15:23    Procedures Procedures (including critical care time)  Medications Ordered in ED Medications  0.9 %  sodium chloride infusion (has no administration in time range)  sodium chloride 0.9 % bolus 1,000 mL (has no administration in time range)     Initial Impression / Assessment and Plan / ED Course  I have reviewed the triage vital signs and the nursing notes.  Pertinent labs & imaging results that were available during my care of the patient were reviewed by me and considered in my medical decision making (see chart for details).    Patient's chest x-ray noted and patient states that she has a known history of a mass behind her right lung which is been evaluated by her doctor. Patient hypotensive when she initially arrived here.  Given IV fluids.  Is evidence of acute kidney injury on her electrolytes.  Will admit to the hospitalist service  Final Clinical Impressions(s) / ED Diagnoses   Final diagnoses:  None    ED Discharge Orders      None       Lacretia Leigh, MD 02/27/18 1608    Lacretia Leigh, MD  02/27/18 1610    Lacretia Leigh, MD 02/27/18 1623

## 2018-02-27 NOTE — ED Notes (Signed)
MD aware of soft bp.

## 2018-02-27 NOTE — ED Provider Notes (Signed)
Patient placed in Quick Look pathway, seen and evaluated   Chief Complaint: dizziness  HPI:   [name] is a 77 y.o. female who presents to the ED with her daughters with c/o dizziness. Family reports pt has been having dizziness since April, was seen at [facility] in June, seen at PCP last week, treated for sinus infection and nausea. Pt has continual nausea with loss of 20 lbs over the past month per family.   ROS: Neuro: dizziness  Physical Exam:  BP 114/65 (BP Location: Right Arm)   Pulse 96   Temp 98.9 F (37.2 C) (Oral)   Resp 16   Ht 5\' 6"  (1.676 m)   Wt 60.3 kg   SpO2 99%   BMI 21.47 kg/m    Gen: No distress  Neuro: Awake and Alert, ambulatory to triage, grips are equal, no pronator drift, radial pulses 2+. Good strength lower extremities.   Skin: Warm and dry     Initiation of care has begun. The patient has been counseled on the process, plan, and necessity for staying for the completion/evaluation, and the remainder of the medical screening examination    Ashley Murrain, NP 02/27/18 [place]    Julianne Rice, MD 02/28/18 [phone]

## 2018-02-27 NOTE — ED Triage Notes (Signed)
Family reports pt has been having dizziness since April, was seen at [facility] in June, seen at PCP last week, treated for sinus infection and nausea. Pt has continual nausea with loss of 20 lbs over the past month per family.

## 2018-02-27 NOTE — ED Notes (Signed)
Report attempted 

## 2018-02-27 NOTE — Telephone Encounter (Signed)
Order was placed and pt was called to advise of mri but than pt daughter let me know pt is being admitted to the hospital for dehydration. Did not make MRI appt due to this news .  [place]

## 2018-02-28 ENCOUNTER — Observation Stay (HOSPITAL_COMMUNITY): Payer: Medicare Other

## 2018-02-28 DIAGNOSIS — E1169 Type 2 diabetes mellitus with other specified complication: Secondary | ICD-10-CM | POA: Diagnosis not present

## 2018-02-28 DIAGNOSIS — G4452 New daily persistent headache (NDPH): Secondary | ICD-10-CM | POA: Diagnosis not present

## 2018-02-28 DIAGNOSIS — I9589 Other hypotension: Secondary | ICD-10-CM | POA: Diagnosis not present

## 2018-02-28 DIAGNOSIS — N179 Acute kidney failure, unspecified: Secondary | ICD-10-CM | POA: Diagnosis not present

## 2018-02-28 DIAGNOSIS — C3411 Malignant neoplasm of upper lobe, right bronchus or lung: Secondary | ICD-10-CM | POA: Diagnosis not present

## 2018-02-28 DIAGNOSIS — E860 Dehydration: Secondary | ICD-10-CM | POA: Diagnosis not present

## 2018-02-28 DIAGNOSIS — R634 Abnormal weight loss: Secondary | ICD-10-CM

## 2018-02-28 DIAGNOSIS — E861 Hypovolemia: Secondary | ICD-10-CM

## 2018-02-28 DIAGNOSIS — R630 Anorexia: Secondary | ICD-10-CM

## 2018-02-28 DIAGNOSIS — R42 Dizziness and giddiness: Secondary | ICD-10-CM

## 2018-02-28 LAB — BASIC METABOLIC PANEL
ANION GAP: 9 (ref 5–15)
BUN: 36 mg/dL — ABNORMAL HIGH (ref 8–23)
CO2: 21 mmol/L — ABNORMAL LOW (ref 22–32)
Calcium: 8 mg/dL — ABNORMAL LOW (ref 8.9–10.3)
Chloride: 108 mmol/L (ref 98–111)
Creatinine, Ser: 1.38 mg/dL — ABNORMAL HIGH (ref 0.44–1.00)
GFR calc Af Amer: 42 mL/min — ABNORMAL LOW (ref >60)
GFR, EST NON AFRICAN AMERICAN: 36 mL/min — AB (ref >60)
GLUCOSE: 225 mg/dL — AB (ref 70–99)
POTASSIUM: 3.6 mmol/L (ref 3.5–5.1)
Sodium: 138 mmol/L (ref 135–145)

## 2018-02-28 LAB — CBC
HEMATOCRIT: 31.7 % — AB (ref 36.0–46.0)
HEMOGLOBIN: 10.4 g/dL — AB (ref 12.0–15.0)
MCH: 30.4 pg (ref 26.0–34.0)
MCHC: 32.8 g/dL (ref 30.0–36.0)
MCV: 92.7 fL (ref 78.0–100.0)
Platelets: 460 10*3/uL — ABNORMAL HIGH (ref 150–400)
RBC: 3.42 MIL/uL — AB (ref 3.87–5.11)
RDW: 14.6 % (ref 11.5–15.5)
WBC: 20.8 10*3/uL — AB (ref 4.0–10.5)

## 2018-02-28 LAB — GLUCOSE, CAPILLARY
GLUCOSE-CAPILLARY: 152 mg/dL — AB (ref 70–99)
GLUCOSE-CAPILLARY: 296 mg/dL — AB (ref 70–99)
Glucose-Capillary: 214 mg/dL — ABNORMAL HIGH (ref 70–99)
Glucose-Capillary: 251 mg/dL — ABNORMAL HIGH (ref 70–99)

## 2018-02-28 LAB — PROCALCITONIN

## 2018-02-28 MED ORDER — FLUCONAZOLE 150 MG PO TABS
150.0000 mg | ORAL_TABLET | Freq: Once | ORAL | Status: AC
  Administered 2018-02-28: 150 mg via ORAL
  Filled 2018-02-28: qty 1

## 2018-02-28 MED ORDER — MEGESTROL ACETATE 400 MG/10ML PO SUSP
400.0000 mg | Freq: Two times a day (BID) | ORAL | Status: DC
  Administered 2018-02-28 – 2018-03-02 (×5): 400 mg via ORAL
  Filled 2018-02-28 (×6): qty 10

## 2018-02-28 MED ORDER — GLUCERNA SHAKE PO LIQD
237.0000 mL | Freq: Three times a day (TID) | ORAL | Status: DC
  Administered 2018-02-28 – 2018-03-01 (×4): 237 mL via ORAL
  Filled 2018-02-28 (×8): qty 237

## 2018-02-28 MED ORDER — ADULT MULTIVITAMIN W/MINERALS CH
1.0000 | ORAL_TABLET | Freq: Every day | ORAL | Status: DC
  Administered 2018-02-28 – 2018-03-02 (×3): 1 via ORAL
  Filled 2018-02-28 (×3): qty 1

## 2018-02-28 NOTE — Progress Notes (Signed)
Lab states that the procalcitonin is hemolyzed. They have to recollect.

## 2018-02-28 NOTE — Progress Notes (Signed)
Pt has returned from MRI. 

## 2018-02-28 NOTE — Progress Notes (Signed)
Inpatient Diabetes Program Recommendations  AACE/ADA: New Consensus Statement on Inpatient Glycemic Control (2015)  Target Ranges:  Prepandial:   less than 140 mg/dL      Peak postprandial:   less than 180 mg/dL (1-2 hours)      Critically ill patients:  140 - 180 mg/dL   Lab Results  Component Value Date   GLUCAP 214 (H) 02/28/2018   HGBA1C 9.0 11/05/2017    Review of Glycemic Control  Diabetes history: DM2 Outpatient Diabetes medications: Janumet 50/1000 mg bid Current orders for Inpatient glycemic control: Novolog 0-9 units tidwc  HgbA1C on 11/05/2017 - 9%. Poor po intake.   Inpatient Diabetes Program Recommendations:     Change Novolog to 0-9 units Q4H while poor po intake Need updated HgbA1C.  Continue to follow.  Thank you. Lorenda Peck, RD, LDN, CDE Inpatient Diabetes Coordinator [phone]

## 2018-02-28 NOTE — Progress Notes (Signed)
Pt went down for MRI.

## 2018-02-28 NOTE — Progress Notes (Signed)
Initial Nutrition Assessment  DOCUMENTATION CODES:   Non-severe (moderate) malnutrition in context of acute illness/injury  INTERVENTION:   Glucerna Shake po TID, each supplement provides 220 kcal and 10 grams of protein  Add MVI with minerals daily  NUTRITION DIAGNOSIS:   Moderate Malnutrition related to acute illness(AKI, worsening RUL mass) as evidenced by energy intake < or equal to 50% for > or equal to 5 days, mild fat depletion.  GOAL:   Patient will meet greater than or equal to 90% of their needs  MONITOR:   PO intake, Supplement acceptance, Labs, Weight trends  REASON FOR ASSESSMENT:   Consult Assessment of nutrition requirement/status  ASSESSMENT:   77 yo female admitted with lightheadedness/hypotension with AKI in setting of volume depletion and dehydration. CT chest this admission revealing worsening RUL lung mass. 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 Pt reports eating poorly for 3 weeks; pt eating 1 bite of something and then would go lie down. Note admission diagnosis related to dehydration and poor intake. Pt does not normally eat large portions, more snacks all through out the day.   Pt reports weight loss; indicates UBW around 145 pounds. Current wt 133 pounds.  Appears that pt weighed this around Spring of this past year; no weights since May in system. Weight of 61.7 kg 8/6. 2% wt loss not significant for time frame.   Labs: Creatinine 1.38, BUN 36, CBGs 152-270 Meds: NS at 75 ml/hr, megace   NUTRITION - FOCUSED PHYSICAL EXAM:    Most Recent Value  Orbital Region  Mild depletion  Upper Arm Region  Mild depletion  Thoracic and Lumbar Region  Mild depletion  Buccal Region  Mild depletion  Temple Region  Mild depletion  Clavicle Bone Region  No depletion  Clavicle and Acromion Bone Region  No depletion  Scapular  Bone Region  No depletion  Dorsal Hand  No depletion  Patellar Region  No depletion  Anterior Thigh Region  No depletion  Posterior Calf Region  No depletion  Edema (RD Assessment)  None       Diet Order:   Diet Order            Diet Carb Modified Fluid consistency: Thin; Room service appropriate? Yes  Diet effective now              EDUCATION NEEDS:   Education needs have been addressed  Skin:  Skin Assessment: Reviewed RN Assessment  Last BM:  8/15  Height:   Ht Readings from Last 1 Encounters:  02/27/18 5\' 6"  (1.676 m)    Weight:   Wt Readings from Last 1 Encounters:  02/28/18 60.4 kg    Ideal Body Weight:     BMI:  Body mass index is 21.48 kg/m.  Estimated Nutritional Needs:   Kcal:  1480-1670 kcals   Protein:  74-83 g   Fluid:  >/= 1.5 L   Kerman Passey MS, RD, LDN, CNSC [phone] Pager  [phone] Weekend/On-Call Pager

## 2018-02-28 NOTE — Evaluation (Signed)
Physical Therapy Evaluation Patient Details Name: [name] MRN: [id number] DOB: 1940-08-03 Today's Date: 02/28/2018   History of Present Illness  Pt is a 77 y.o. F with significant PMH of hypertension, diabetes mellitus, hyperlipidemia, carotid artery stenosis s/p endarterectomy, tobacco abuse, C. difficile in the past and diagnosis of squamous cell carcinoma of right upper lung. Admitted with decreased appetite/oral intake, nausea, weight loss, and worsening dizziness/lightheadedness  Clinical Impression  Pt admitted with above diagnosis. Pt currently with functional limitations due to the deficits listed below (see PT Problem List). Patient is independent at baseline. Currently requiring min guard assist for ambulating hallway distances with no assistive device. Does display mild balance deficits, scoring 19/24 on Dynamic Gait Index (<19/24 indicating patient is at high risk for falls). No subjective complaint of dizziness and patient is not orthostatic (see below)[name], PT, DPT Acute Rehabilitation Services  Pager: [phone]  will benefit from skilled PT to increase their independence and safety with mobility to allow discharge to the venue listed below.     Orthostatics: Supine: 96/53 Sitting: 109/53 Standing: 106/49    Follow Up Recommendations Outpatient PT    Equipment Recommendations  None recommended by PT    Recommendations for Other Services       Precautions / Restrictions Precautions Precautions: Fall Restrictions Weight Bearing Restrictions: No      Mobility  Bed Mobility Overal bed mobility: Independent                Transfers Overall transfer level: Needs assistance Equipment used: None Transfers: Sit to/from Stand Sit to Stand: Supervision         General transfer comment: Seeking external support initially for transfer  Ambulation/Gait Ambulation/Gait assistance: Min guard Gait Distance (Feet): 400 Feet Assistive device:  None Gait Pattern/deviations: Step-through pattern;Scissoring;Narrow base of support     General Gait Details: Patient with mild unsteadiness requiring min guard assist. Occasional scissoring with distractions and dual tasking  Stairs            Wheelchair Mobility    Modified Rankin (Stroke Patients Only)       Balance                                 Standardized Balance Assessment Standardized Balance Assessment : Dynamic Gait Index   Dynamic Gait Index Level Surface: Mild Impairment Change in Gait Speed: Mild Impairment Gait with Horizontal Head Turns: Normal Gait with Vertical Head Turns: Normal Gait and Pivot Turn: Mild Impairment Step Over Obstacle: Mild Impairment Step Around Obstacles: Normal Steps: Mild Impairment Total Score: 19       Pertinent Vitals/Pain Pain Assessment: No/denies pain    Home Living Family/patient expects to be discharged to:: Private residence Living Arrangements: Spouse/significant other;Children Available Help at Discharge: Family Type of Home: House Home Access: Stairs to enter Entrance Stairs-Rails: None Technical brewer of Steps: 1 Home Layout: One level Home Equipment: Environmental consultant - 2 wheels      Prior Function Level of Independence: Independent         Comments: Reports feeling weaker lately due to lack of eating     Hand Dominance        Extremity/Trunk Assessment   Upper Extremity Assessment Upper Extremity Assessment: Overall WFL for tasks assessed    Lower Extremity Assessment Lower Extremity Assessment: Overall WFL for tasks assessed       Communication   Communication: No difficulties  Cognition  Arousal/Alertness: Awake/alert Behavior During Therapy: WFL for tasks assessed/performed Overall Cognitive Status: Within Functional Limits for tasks assessed                                        General Comments      Exercises     Assessment/Plan    PT  Assessment Patient needs continued PT services  PT Problem List Decreased activity tolerance;Decreased balance;Decreased mobility       PT Treatment Interventions Gait training;Stair training;Functional mobility training;Therapeutic activities;Therapeutic exercise;Balance training;Patient/family education    PT Goals (Current goals can be found in the Care Plan section)  Acute Rehab PT Goals Patient Stated Goal: "do things for myself." PT Goal Formulation: With patient Time For Goal Achievement: 03/14/18 Potential to Achieve Goals: Good    Frequency Min 3X/week   Barriers to discharge        Co-evaluation               AM-PAC PT "6 Clicks" Daily Activity  Outcome Measure Difficulty turning over in bed (including adjusting bedclothes, sheets and blankets)?: None Difficulty moving from lying on back to sitting on the side of the bed? : None Difficulty sitting down on and standing up from a chair with arms (e.g., wheelchair, bedside commode, etc,.)?: None Help needed moving to and from a bed to chair (including a wheelchair)?: A Little Help needed walking in hospital room?: A Little Help needed climbing 3-5 steps with a railing? : A Little 6 Click Score: 21    End of Session Equipment Utilized During Treatment: Gait belt Activity Tolerance: Patient tolerated treatment well Patient left: in chair;with call bell/phone within reach;with family/visitor present Nurse Communication: Mobility status PT Visit Diagnosis: Unsteadiness on feet (R26.81);Difficulty in walking, not elsewhere classified (R26.2)    Time: 9791-5041 PT Time Calculation (min) (ACUTE ONLY): 16 min   Charges:   PT Evaluation $PT Eval Moderate Complexity: 1 Mod         .car Avery 02/28/2018, 10:25 AM

## 2018-02-28 NOTE — Progress Notes (Signed)
PROGRESS NOTE    [name]  HYQ:[id number] DOB: 03/31/1941 DOA: 02/27/2018 PCP: [name], MD  Outpatient Specialists:     Brief Narrative:  [name]  is a  77 year old female, with past medical history significant for hypertension, diabetes mellitus, hyperlipidemia, carotid artery stenosis status post endarterectomy, tobacco abuse, C. difficile in the past, and diagnosis of squamous cell carcinoma of right upper lung, no evidence of meds, refused surgery, post XRT by Dr. Sondra Come finished last March.  Patient presented with with multiple complaints that have been building up over the last few months, mainly nausea, decreased appetite and oral intake, nausea, but no vomiting, weight loss of around 15 pounds over last 4 months, worsening dizziness/lightheadedness.  Patient was prescribed steroids and Augmentin by her PCP for presumed sinusitis, reports cough, chronic, with white productive sputum.  On presentation to the hospital, patient was found to be orthostatic, with initial blood pressure of 87/55 mmHg that responded to fluid bolus.  Patient has marked leukocytosis, 23,000, but afebrile.  (Patient was on taking prednisone taper, and Augmentin).  Chest x-ray revealed right upper lung mass versus infection, creatinine on admission was elevated, at 1.69, and BUN was 45.  02/28/2018: MRI of the brain done without contrast did not reveal any metastasis to the brain.  T of the chest without contrast revealed "Enlarging right upper lobe mass with extension to the pleura as well as the right hilum when compared with the prior exam. Some surrounding infiltrative changes are noted as described above a portion of which are likely related to the prior radiation therapy although some localized extension or postobstructive changes would deserve consideration as well.  Aortic Atherosclerosis ".  Patient remains afebrile.  Her WBC is down to 20.8.  Serum creatinine has gone down from 1.69 to 1.38.   Patient is no longer orthostatic.  However, patient is not back to her baseline.  Patient continues to report poor p.o. intake.  We will continue IV fluids for now.  Will start patient on Megace.  Physical therapy input is appreciated.  Hopefully, patient be discharged in the morning.  We will also repeat CBC, renal function and electrolytes in the morning.   Assessment & Plan:   Active Problems:   Hypertension associated with diabetes ([place])   DM type 2 with diabetic dyslipidemia ([place])   Light smoker   History of CVA (cerebrovascular accident)   S/P recent Left carotid endarterectomy   Lung mass   Malignant neoplasm of lung ([place])   AKI (acute kidney injury) ([place])   Lightheadedness/hypotension: -Her main complaints was lightheadedness, he was hypotensive in ED, this is clearly in the setting of dehydration, volume depletion, while using lisinopril/hydrochlorothiazide, she was given IV fluids with good response, I will hold her antihypertensive meds,. -She reports weight loss and poor oral intake, will consult nutritionist. 02/28/2018: Hypotension has resolved.  The blood pressure this morning was 118/54 mmHg.  Patient is no longer orthostatic.  However, patient remains mildly volume depleted, and still reports feeling dizzy, and not back to her baseline. Continue IV fluids. Start patient on Megace.  Caloric count.  Hopefully, patient will be discharged back home in the morning.  Need to assess leukocytosis to ensure there is reactive.  Low threshold to treat patient for postobstructive pneumonia.  History of lung cancer: -Patient finished radiation therapy last March, she having weight loss, nausea, chronic cough, x-ray with worsening opacity, clinically does not appear to be having pneumonia, her leukocytosis most likely to  be due to recent use of steroids, initiating antibiotics especially with her history of C. difficile, I will proceed with CT chest for further evaluation, if evidence of  infection then will start on antibiotics. 02/28/2018: CT chest reveals worsening right upper lobe lung mass.  Possible infiltrate around the mass questioned.  Low threshold to treat patient for postobstructive pneumonia.  Leukocytosis: -He is nontoxic-appearing, this is most likely in the setting of recent steroid use, no antibiotics patient with history of C. difficile in the past, follow on CT chest -See above. -CBC in the morning. -Check procalcitonin level.  Possible postobstructive pneumonia: Right upper lung mass is worsening. Possible infiltrate question. Low-dose to introduce antibiotics if leukocytosis does not continue to resolve. Follow procalcitonin level.  History of malignancy noted.  Headache/migraine: -Is been progressive complaints over last 4 months, I will obtain MRI brain to rule out any mets. MRI of the brain done without contrast was negative for brain metastasis.  No acute findings noted.  AKI: -In the setting of volume depletion/dehydration while using lisinopril/hydrochlorothiazide, will hold these meds and start on IV fluids. -This is slowly resolving.  Check renal function and electrolytes in the morning.  Anorexia/poor p.o. intake/failure to thrive at home: -Start patient on Megace. -Caloric count.  Hypertension: -Patient was hypotensive on presentation. -To need to hold home meds  History of CVA/left carotid disease: -Endarterectomy, continue with Plavix  Diabetes mellitus: -Hold Home meds, continue with sliding scale Need to optimize.  Tobacco abuse: -Counseled, started nicotine patch   DVT prophylaxis: Subcu heparin Code Status: Full code Family Communication: Husband and daughter Disposition Plan: Home eventually   Consultants:   None  Procedures:   None  Antimicrobials:   None   Subjective: Patient still feels dizzy. She is not back to her baseline. Continues to report poor p.o. Intake. No fever or  chills. Productive cough.  Objective: Vitals:   02/27/18 1645 02/27/18 1820 02/27/18 2112 02/28/18 0430  BP: 102/67 (!) 87/49 (!) 109/50 (!) 118/54  Pulse: 80 81 82 93  Resp: 17 18 18 20   Temp:  98 F (36.7 C) 98.1 F (36.7 C) 98.4 F (36.9 C)  TempSrc:  Oral Oral Oral  SpO2: 100% 98% 98% 95%  Weight:   60.2 kg   Height:        Intake/Output Summary (Last 24 hours) at 02/28/2018 1019 Last data filed at 02/28/2018 0417 Gross per 24 hour  Intake 707.92 ml  Output 0 ml  Net 707.92 ml   Filed Weights   02/27/18 1345 02/27/18 2112  Weight: 60.3 kg 60.2 kg    Examination:  General exam: Appears calm and comfortable.  Cachectic Respiratory system: Decreased air entry globally, with mild expiratory wheeze.   Cardiovascular system: S1 & S2.  Gastrointestinal system: Abdomen is nondistended, soft and nontender. No organomegaly or masses felt. Normal bowel sounds heard. Central nervous system: Alert and oriented. No focal neurological deficits. Extremities: No leg edema.   Psychiatry: Judgement and insight appear normal. Mood & affect appropriate.     Data Reviewed: I have personally reviewed following labs and imaging studies  CBC: Recent Labs  Lab 02/27/18 1358 02/28/18 0346  WBC 23.3* 20.8*  HGB 12.0 10.4*  HCT 37.2 31.7*  MCV 92.8 92.7  PLT 551* 762*   Basic Metabolic Panel: Recent Labs  Lab 02/27/18 1358 02/28/18 0346  NA 136 138  K 3.8 3.6  CL 99 108  CO2 24 21*  GLUCOSE 228* 225*  BUN 45*  36*  CREATININE 1.69* 1.38*  CALCIUM 9.4 8.0*   GFR: Estimated Creatinine Clearance: 32 mL/min (A) (by C-G formula based on SCr of 1.38 mg/dL (H)). Liver Function Tests: No results for input(s): AST, ALT, ALKPHOS, BILITOT, PROT, ALBUMIN in the last 168 hours. No results for input(s): LIPASE, AMYLASE in the last 168 hours. No results for input(s): AMMONIA in the last 168 hours. Coagulation Profile: No results for input(s): INR, PROTIME in the last 168  hours. Cardiac Enzymes: No results for input(s): CKTOTAL, CKMB, CKMBINDEX, TROPONINI in the last 168 hours. BNP (last 3 results) No results for input(s): PROBNP in the last 8760 hours. HbA1C: No results for input(s): HGBA1C in the last 72 hours. CBG: Recent Labs  Lab 02/27/18 1824 02/27/18 2112 02/28/18 0803  GLUCAP 226* 270* 214*   Lipid Profile: No results for input(s): CHOL, HDL, LDLCALC, TRIG, CHOLHDL, LDLDIRECT in the last 72 hours. Thyroid Function Tests: No results for input(s): TSH, T4TOTAL, FREET4, T3FREE, THYROIDAB in the last 72 hours. Anemia Panel: No results for input(s): VITAMINB12, FOLATE, FERRITIN, TIBC, IRON, RETICCTPCT in the last 72 hours. Urine analysis:    Component Value Date/Time   COLORURINE YELLOW 07/25/2013 [place] 07/25/2013 0952   LABSPEC 1.020 07/04/2017 1051   PHURINE 6.5 07/25/2013 0952   GLUCOSEU >1000 (A) 07/25/2013 0952   HGBUR NEGATIVE 07/25/2013 0952   BILIRUBINUR negative 07/04/2017 1051   KETONESUR negative 07/04/2017 1051   KETONESUR 15 (A) 07/25/2013 0952   PROTEINUR negative 07/04/2017 1051   PROTEINUR 30 (A) 07/25/2013 0952   UROBILINOGEN 0.2 07/25/2013 0952   NITRITE Negative 07/04/2017 1051   NITRITE NEGATIVE 07/25/2013 0952   LEUKOCYTESUR Negative 07/04/2017 1051   Sepsis Labs: @LABRCNTIP (procalcitonin:4,lacticidven:4)  )No results found for this or any previous visit (from the past 240 hour(s)).       Radiology Studies: Dg Chest 2 View  Result Date: 02/27/2018 CLINICAL DATA:  Dizziness and weight loss. Current smoker. History of lung cancer. EXAM: CHEST - 2 VIEW COMPARISON:  CT scan October 28, 2017 FINDINGS: There is increased opacity in the right upper lung at the site of the patient's known malignancy. The opacity is somewhat rounded and masslike on the lateral view. This represents a change compared to the October 28, 2017 chest CT scout film. No pneumothorax. No other interval changes. IMPRESSION:  Increasing opacity with a somewhat masslike component the right upper lobe with the site of the patient's known malignancy. Whether this represents worsening malignancy, post treatment change around the known malignancy, or a postobstructive infection due to the malignancy is unclear on this x-ray. A CT scan may better evaluate. Electronically Signed   By: Dorise Bullion III M.D   On: 02/27/2018 15:23   Ct Chest Wo Contrast  Result Date: 02/27/2018 CLINICAL DATA:  Increasing density on recent plain film EXAM: CT CHEST WITHOUT CONTRAST TECHNIQUE: Multidetector CT imaging of the chest was performed following the standard protocol without IV contrast. COMPARISON:  Plain film from earlier in the same day, CT of the chest dated 10/28/2017, PET-CT dated 04/26/2017 FINDINGS: Cardiovascular: Limited by the lack of IV contrast. Atherosclerotic changes of the thoracic aorta and its branches are noted. Coronary calcifications are seen. No cardiac enlargement is noted. The branching pattern of the pulmonary artery is within normal limits. Mediastinum/Nodes: Thoracic inlet is within normal limits. Scattered tiny mediastinal nodes are identified similar to that seen on prior exam. No sizable hilar or mediastinal adenopathy is seen. The esophagus is within normal  limits. Lungs/Pleura: The left lung is again well aerated without focal infiltrate or sizable effusion. No parenchymal nodules are seen. The right lung is again well aerated. In the left upper lobe there is again noted a soft tissue mass lesion which measures at least 5.3 by 3.3 cm in greatest dimension significantly increased from the prior study. There is extension to the pleural margin at this time with pleural thickening identified. There is also extension centrally into the right hilum. Some peripheral infiltrative changes are seen. Portion of this may be related to post radiation change as previously described. Some postobstructive infiltrate could not be  totally excluded given the enlarging size of the mass. Additionally the possibility of localized lymphangitic spread would deserve consideration as well. No sizable effusion is seen. Upper Abdomen: Gallbladder has been surgically removed. No other focal abnormality is noted in the upper abdomen. Contrast limits of evaluation of the solid organs. Musculoskeletal: Degenerative changes of the thoracic spine are noted. IMPRESSION: Enlarging right upper lobe mass with extension to the pleura as well as the right hilum when compared with the prior exam. Some surrounding infiltrative changes are noted as described above a portion of which are likely related to the prior radiation therapy although some localized extension or postobstructive changes would deserve consideration as well. Aortic Atherosclerosis (ICD10-I70.0). Electronically Signed   By: Inez Catalina M.D.   On: 02/27/2018 20:16   Mr Brain Wo Contrast  Result Date: 02/28/2018 CLINICAL DATA:  Initial evaluation for persistent headache. History of lung cancer. EXAM: MRI HEAD WITHOUT CONTRAST TECHNIQUE: Multiplanar, multiecho pulse sequences of the brain and surrounding structures were obtained without intravenous contrast. COMPARISON:  Previous MRI from 02/16/2017. FINDINGS: Brain: Generalized age-related cerebral atrophy. Patchy and confluent T2/FLAIR hyperintensity within the periventricular and deep white matter both cerebral hemispheres, most like related chronic small vessel ischemic change, similar to previous. Small remote lacunar infarct at the posterior right lentiform nucleus, unchanged. Left frontal lobe encephalomalacia also stable. No abnormal foci of restricted diffusion to suggest acute or subacute ischemia. Gray-white matter differentiation maintained. No evidence for acute or chronic intracranial hemorrhage. No mass lesion, midline shift or mass effect. No hydrocephalus. No extra-axial fluid collection. No appreciable intracranial metastatic  disease on this noncontrast examination. Pituitary gland within normal limits. Vascular: Major intracranial vascular flow voids maintained Skull and upper cervical spine: Craniocervical junction normal. Visualized upper cervical spine within normal limits. No focal marrow replacing lesion. No scalp soft tissue abnormality. Sinuses/Orbits: Globes and orbital soft tissues demonstrate no acute finding. Patient status post ocular lens replacement on the right. Mild chronic ethmoidal and maxillary sinusitis. No air-fluid levels to suggest acute sinus disease. No mastoid effusion. Inner ear structures normal. Other: None. IMPRESSION: 1. No acute intracranial abnormality. 2. Stable atrophy with chronic small vessel ischemic disease and remote left frontal encephalomalacia. 3. No evidence for metastatic disease on this noncontrast examination. Electronically Signed   By: Jeannine Boga M.D.   On: 02/28/2018 00:57        Scheduled Meds: . clopidogrel  75 mg Oral Daily  . guaiFENesin  600 mg Oral BID  . heparin  5,000 Units Subcutaneous Q8H  . insulin aspart  0-9 Units Subcutaneous TID WC  . nicotine  21 mg Transdermal Daily  . rosuvastatin  20 mg Oral Daily  . sodium chloride flush  3 mL Intravenous Q12H   Continuous Infusions: . sodium chloride 75 mL/hr at 02/28/18 0417  . sodium chloride    . sodium chloride  LOS: 0 days    Time spent: 35 minutes    Dana Allan, MD  [facility] Pager #: [phone] 7PM-7AM contact night coverage as above

## 2018-03-01 DIAGNOSIS — N179 Acute kidney failure, unspecified: Secondary | ICD-10-CM

## 2018-03-01 DIAGNOSIS — Z8673 Personal history of transient ischemic attack (TIA), and cerebral infarction without residual deficits: Secondary | ICD-10-CM | POA: Diagnosis not present

## 2018-03-01 DIAGNOSIS — E1159 Type 2 diabetes mellitus with other circulatory complications: Secondary | ICD-10-CM

## 2018-03-01 DIAGNOSIS — Z9889 Other specified postprocedural states: Secondary | ICD-10-CM

## 2018-03-01 DIAGNOSIS — R918 Other nonspecific abnormal finding of lung field: Secondary | ICD-10-CM

## 2018-03-01 DIAGNOSIS — E785 Hyperlipidemia, unspecified: Secondary | ICD-10-CM

## 2018-03-01 DIAGNOSIS — E440 Moderate protein-calorie malnutrition: Secondary | ICD-10-CM

## 2018-03-01 DIAGNOSIS — E1169 Type 2 diabetes mellitus with other specified complication: Secondary | ICD-10-CM | POA: Diagnosis not present

## 2018-03-01 DIAGNOSIS — E860 Dehydration: Secondary | ICD-10-CM

## 2018-03-01 DIAGNOSIS — I10 Essential (primary) hypertension: Secondary | ICD-10-CM

## 2018-03-01 DIAGNOSIS — C3411 Malignant neoplasm of upper lobe, right bronchus or lung: Secondary | ICD-10-CM

## 2018-03-01 LAB — RENAL FUNCTION PANEL
Albumin: 2.4 g/dL — ABNORMAL LOW (ref 3.5–5.0)
Anion gap: 9 (ref 5–15)
BUN: 25 mg/dL — ABNORMAL HIGH (ref 8–23)
CO2: 20 mmol/L — ABNORMAL LOW (ref 22–32)
Calcium: 8 mg/dL — ABNORMAL LOW (ref 8.9–10.3)
Chloride: 110 mmol/L (ref 98–111)
Creatinine, Ser: 1.17 mg/dL — ABNORMAL HIGH (ref 0.44–1.00)
GFR calc Af Amer: 51 mL/min — ABNORMAL LOW (ref >60)
GFR calc non Af Amer: 44 mL/min — ABNORMAL LOW (ref >60)
Glucose, Bld: 235 mg/dL — ABNORMAL HIGH (ref 70–99)
Phosphorus: 1.9 mg/dL — ABNORMAL LOW (ref 2.5–4.6)
Potassium: 4 mmol/L (ref 3.5–5.1)
Sodium: 139 mmol/L (ref 135–145)

## 2018-03-01 LAB — CBC WITH DIFFERENTIAL/PLATELET
Abs Immature Granulocytes: 0.2 10*3/uL — ABNORMAL HIGH (ref 0.0–0.1)
Basophils Absolute: 0 10*3/uL (ref 0.0–0.1)
Basophils Relative: 0 %
Eosinophils Absolute: 0.7 10*3/uL (ref 0.0–0.7)
Eosinophils Relative: 4 %
HCT: 28.5 % — ABNORMAL LOW (ref 36.0–46.0)
Hemoglobin: 9.1 g/dL — ABNORMAL LOW (ref 12.0–15.0)
Immature Granulocytes: 1 %
Lymphocytes Relative: 13 %
Lymphs Abs: 2.5 10*3/uL (ref 0.7–4.0)
MCH: 30.7 pg (ref 26.0–34.0)
MCHC: 31.9 g/dL (ref 30.0–36.0)
MCV: 96.3 fL (ref 78.0–100.0)
Monocytes Absolute: 1.3 10*3/uL — ABNORMAL HIGH (ref 0.1–1.0)
Monocytes Relative: 7 %
Neutro Abs: 13.9 10*3/uL — ABNORMAL HIGH (ref 1.7–7.7)
Neutrophils Relative %: 75 %
Platelets: 383 10*3/uL (ref 150–400)
RBC: 2.96 MIL/uL — ABNORMAL LOW (ref 3.87–5.11)
RDW: 15 % (ref 11.5–15.5)
WBC: 18.5 10*3/uL — ABNORMAL HIGH (ref 4.0–10.5)

## 2018-03-01 LAB — GLUCOSE, CAPILLARY
GLUCOSE-CAPILLARY: 211 mg/dL — AB (ref 70–99)
GLUCOSE-CAPILLARY: 276 mg/dL — AB (ref 70–99)
GLUCOSE-CAPILLARY: 270 mg/dL — AB (ref 70–99)
Glucose-Capillary: 257 mg/dL — ABNORMAL HIGH (ref 70–99)

## 2018-03-01 LAB — MAGNESIUM: Magnesium: 1.3 mg/dL — ABNORMAL LOW (ref 1.7–2.4)

## 2018-03-01 MED ORDER — SODIUM PHOSPHATES 45 MMOLE/15ML IV SOLN
30.0000 mmol | Freq: Once | INTRAVENOUS | Status: AC
  Administered 2018-03-01: 30 mmol via INTRAVENOUS
  Filled 2018-03-01 (×2): qty 10

## 2018-03-01 MED ORDER — INSULIN ASPART 100 UNIT/ML ~~LOC~~ SOLN
2.0000 [IU] | Freq: Three times a day (TID) | SUBCUTANEOUS | Status: DC
  Administered 2018-03-02 (×2): 2 [IU] via SUBCUTANEOUS

## 2018-03-01 MED ORDER — MAGNESIUM SULFATE 50 % IJ SOLN
3.0000 g | Freq: Once | INTRAVENOUS | Status: AC
  Administered 2018-03-01: 3 g via INTRAVENOUS
  Filled 2018-03-01 (×2): qty 6

## 2018-03-01 NOTE — Progress Notes (Signed)
PROGRESS NOTE    [name]  WCH:[id number] DOB: [birth year] DOA: 02/27/2018 PCP: Nichole Lung, MD    Brief Narrative: [name] a 77 year old female, with past medical history significant for hypertension, diabetes mellitus, hyperlipidemia, carotid artery stenosis status post endarterectomy, tobacco abuse, C. 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 Sondra Walker finished last March.  Patient presented with with multiple complaints that have been building up over the last few months, mainly nausea, decreased appetite and oral intake, nausea, but no vomiting, weight loss of around 15 pounds over last 4 months, worsening dizziness/lightheadedness.  Patient was prescribed steroids and Augmentin by her PCP for presumed sinusitis, reports cough, chronic, with white productive sputum.  On presentation to the hospital, patient was found to be orthostatic, with initial blood pressure of 87/55 mmHg that responded to fluid bolus.  Patient has marked leukocytosis, 23,000, but afebrile.  (Patient was on taking prednisone taper, and Augmentin).  Chest x-ray revealed right upper Walker mass versus infection, creatinine on admission was elevated, at 1.69, and BUN was 45.  02/28/2018: MRI of the brain done without contrast did not reveal any metastasis to the brain.  T of the chest without contrast revealed "Enlarging right upper lobe mass with extension to the pleura as well as the right hilum when compared with the prior exam. Some surrounding infiltrative changes are noted as described above a portion of which are likely related to the prior radiation therapy although some localized extension or postobstructive changes would deserve consideration as well.   Assessment & Plan:   Active Problems:   Hypertension associated with diabetes ([place])   DM type 2 with diabetic dyslipidemia ([place])   Light smoker   History  of CVA (cerebrovascular accident)   S/P recent Left carotid endarterectomy   Walker mass   Malignant neoplasm of Walker (HCC)   AKI (acute kidney injury) ([place])   Malnutrition of moderate degree   Hypotension; from orthostatic hypotension from dehdyration. Resolved with hydration.  D/c hctz and ACE inhibitor.    H/o Walker cancer with new CT findings showing enlargement in the Walker mass.  Further management as per Dr [name].    Diabetes Mellitus: CBG (last 3)  Recent Labs    03/01/18 0801 03/01/18 1214 03/01/18 1626  GLUCAP 211* 276* 270*   Resume SSI.  Add NOVOLOG 2 units TIDAC.    ?Post obstructive pneumonia:  She is afebrile, though elevated leukocytosis. Clinically she appears non toxic.    Headache: Resolved.  MRI brain negative.     H/o of CVA and left carotid disease: Resume plavix, s/o endarterectomy.    Tobacco abuse: On the nicotine patch.     Hypertension well controlled.   AKI;  Possibly pre renal and dehydration.  Improving. With fluids.       DVT prophylaxis: sq heparin.  Code Status: full code.  Family Communication: daughters at bedside.  Disposition Plan: pending PT eval and clinical improvement.    Consultants:   NONE.    Procedures: CT CHEST   MRI BRAIN.    Antimicrobials: none.    Subjective: Reports feeling much better.   Objective: Vitals:   02/28/18 1123 02/28/18 2118 03/01/18 0508 03/01/18 1042  BP:  (!) 110/56 (!) 120/52 (!) 103/44  Pulse:  89 72 80  Resp:  18 18 (!) 28  Temp:  98.7 F (37.1 C) 98.1 F (36.7 C) 98.1 F (  36.7 C)  TempSrc:  Oral Oral Oral  SpO2:  98% 99% 100%  Weight: 60.4 kg     Height:        Intake/Output Summary (Last 24 hours) at 03/01/2018 1807 Last data filed at 03/01/2018 1533 Gross per 24 hour  Intake 1857.4 ml  Output 0 ml  Net 1857.4 ml   Filed Weights   02/27/18 1345 02/27/18 2112 02/28/18 1123  Weight: 60.3 kg 60.2 kg 60.4 kg    Examination:  General exam: Appears  calm and comfortable  Respiratory system: Clear to auscultation. Respiratory effort normal. Cardiovascular system: S1 & S2 heard, RRR.  No pedal edema. Gastrointestinal system: Abdomen is nondistended, soft and nontender. No organomegaly or masses felt. Normal bowel sounds heard. Central nervous system: Alert and oriented. No focal neurological deficits. Extremities: Symmetric 5 x 5 power. Skin: No rashes, lesions or ulcers Psychiatry: Judgement and insight appear normal. Mood & affect appropriate.     Data Reviewed: I have personally reviewed following labs and imaging studies  CBC: Recent Labs  Lab 02/27/18 1358 02/28/18 0346 03/01/18 0426  WBC 23.3* 20.8* 18.5*  NEUTROABS  --   --  13.9*  HGB 12.0 10.4* 9.1*  HCT 37.2 31.7* 28.5*  MCV 92.8 92.7 96.3  PLT 551* 460* 093   Basic Metabolic Panel: Recent Labs  Lab 02/27/18 1358 02/28/18 0346 03/01/18 0426  NA 136 138 139  K 3.8 3.6 4.0  CL 99 108 110  CO2 24 21* 20*  GLUCOSE 228* 225* 235*  BUN 45* 36* 25*  CREATININE 1.69* 1.38* 1.17*  CALCIUM 9.4 8.0* 8.0*  MG  --   --  1.3*  PHOS  --   --  1.9*   GFR: Estimated Creatinine Clearance: 37.7 mL/min (A) (by C-G formula based on SCr of 1.17 mg/dL (H)). Liver Function Tests: Recent Labs  Lab 03/01/18 0426  ALBUMIN 2.4*   No results for input(s): LIPASE, AMYLASE in the last 168 hours. No results for input(s): AMMONIA in the last 168 hours. Coagulation Profile: No results for input(s): INR, PROTIME in the last 168 hours. Cardiac Enzymes: No results for input(s): CKTOTAL, CKMB, CKMBINDEX, TROPONINI in the last 168 hours. BNP (last 3 results) No results for input(s): PROBNP in the last 8760 hours. HbA1C: No results for input(s): HGBA1C in the last 72 hours. CBG: Recent Labs  Lab 02/28/18 1656 02/28/18 2120 03/01/18 0801 03/01/18 1214 03/01/18 1626  GLUCAP 296* 251* 211* 276* 270*   Lipid Profile: No results for input(s): CHOL, HDL, LDLCALC, TRIG, CHOLHDL,  LDLDIRECT in the last 72 hours. Thyroid Function Tests: No results for input(s): TSH, T4TOTAL, FREET4, T3FREE, THYROIDAB in the last 72 hours. Anemia Panel: No results for input(s): VITAMINB12, FOLATE, FERRITIN, TIBC, IRON, RETICCTPCT in the last 72 hours. Sepsis Labs: Recent Labs  Lab 02/28/18 1243  PROCALCITON <0.10    No results found for this or any previous visit (from the past 240 hour(s)).       Radiology Studies: Ct Chest Wo Contrast  Result Date: 02/27/2018 CLINICAL DATA:  Increasing density on recent plain film EXAM: CT CHEST WITHOUT CONTRAST TECHNIQUE: Multidetector CT imaging of the chest was performed following the standard protocol without IV contrast. COMPARISON:  Plain film from earlier in the same day, CT of the chest dated 10/28/2017, PET-CT dated 04/26/2017 FINDINGS: Cardiovascular: Limited by the lack of IV contrast. Atherosclerotic changes of the thoracic aorta and its branches are noted. Coronary calcifications are seen. No cardiac enlargement is noted.  The branching pattern of the pulmonary artery is within normal limits. Mediastinum/Nodes: Thoracic inlet is within normal limits. Scattered tiny mediastinal nodes are identified similar to that seen on prior exam. No sizable hilar or mediastinal adenopathy is seen. The esophagus is within normal limits. Lungs/Pleura: The left Walker is again well aerated without focal infiltrate or sizable effusion. No parenchymal nodules are seen. The right Walker is again well aerated. In the left upper lobe there is again noted a soft tissue mass lesion which measures at least 5.3 by 3.3 cm in greatest dimension significantly increased from the prior study. There is extension to the pleural margin at this time with pleural thickening identified. There is also extension centrally into the right hilum. Some peripheral infiltrative changes are seen. Portion of this may be related to post radiation change as previously described. Some  postobstructive infiltrate could not be totally excluded given the enlarging size of the mass. Additionally the possibility of localized lymphangitic spread would deserve consideration as well. No sizable effusion is seen. Upper Abdomen: Gallbladder has been surgically removed. No other focal abnormality is noted in the upper abdomen. Contrast limits of evaluation of the solid organs. Musculoskeletal: Degenerative changes of the thoracic spine are noted. IMPRESSION: Enlarging right upper lobe mass with extension to the pleura as well as the right hilum when compared with the prior exam. Some surrounding infiltrative changes are noted as described above a portion of which are likely related to the prior radiation therapy although some localized extension or postobstructive changes would deserve consideration as well. Aortic Atherosclerosis (ICD10-I70.0). Electronically Signed   By: Inez Catalina M.D.   On: 02/27/2018 20:16   Mr Brain Wo Contrast  Result Date: 02/28/2018 CLINICAL DATA:  Initial evaluation for persistent headache. History of Walker cancer. EXAM: MRI HEAD WITHOUT CONTRAST TECHNIQUE: Multiplanar, multiecho pulse sequences of the brain and surrounding structures were obtained without intravenous contrast. COMPARISON:  Previous MRI from 02/16/2017. FINDINGS: Brain: Generalized age-related cerebral atrophy. Patchy and confluent T2/FLAIR hyperintensity within the periventricular and deep white matter both cerebral hemispheres, most like related chronic small vessel ischemic change, similar to previous. Small remote lacunar infarct at the posterior right lentiform nucleus, unchanged. Left frontal lobe encephalomalacia also stable. No abnormal foci of restricted diffusion to suggest acute or subacute ischemia. Gray-white matter differentiation maintained. No evidence for acute or chronic intracranial hemorrhage. No mass lesion, midline shift or mass effect. No hydrocephalus. No extra-axial fluid collection.  No appreciable intracranial metastatic disease on this noncontrast examination. Pituitary gland within normal limits. Vascular: Major intracranial vascular flow voids maintained Skull and upper cervical spine: Craniocervical junction normal. Visualized upper cervical spine within normal limits. No focal marrow replacing lesion. No scalp soft tissue abnormality. Sinuses/Orbits: Globes and orbital soft tissues demonstrate no acute finding. Patient status post ocular lens replacement on the right. Mild chronic ethmoidal and maxillary sinusitis. No air-fluid levels to suggest acute sinus disease. No mastoid effusion. Inner ear structures normal. Other: None. IMPRESSION: 1. No acute intracranial abnormality. 2. Stable atrophy with chronic small vessel ischemic disease and remote left frontal encephalomalacia. 3. No evidence for metastatic disease on this noncontrast examination. Electronically Signed   By: Jeannine Boga M.D.   On: 02/28/2018 00:57        Scheduled Meds: . clopidogrel  75 mg Oral Daily  . feeding supplement (GLUCERNA SHAKE)  237 mL Oral TID BM  . guaiFENesin  600 mg Oral BID  . heparin  5,000 Units Subcutaneous Q8H  . insulin  aspart  0-9 Units Subcutaneous TID WC  . megestrol  400 mg Oral BID  . multivitamin with minerals  1 tablet Oral Daily  . nicotine  21 mg Transdermal Daily  . rosuvastatin  20 mg Oral Daily  . sodium chloride flush  3 mL Intravenous Q12H   Continuous Infusions: . sodium chloride Stopped (03/01/18 0943)  . sodium chloride    . sodium phosphate  Dextrose 5% IVPB 43 mL/hr at 03/01/18 1533     LOS: 0 days    Time spent: 35 minutes.     Hosie Poisson, MD [facility] Pager [phone]   If 7PM-7AM, please contact night-coverage [email or web address] Password [facility] 03/01/2018, 6:07 PM

## 2018-03-01 NOTE — Plan of Care (Signed)
  Problem: Skin Integrity: Goal: Risk for impaired skin integrity will decrease Outcome: Progressing   Problem: Pain Managment: Goal: General experience of comfort will improve Outcome: Completed/Met   Problem: Coping: Goal: Level of anxiety will decrease Outcome: Progressing   Problem: Nutrition: Goal: Adequate nutrition will be maintained Outcome: Progressing   Problem: Education: Goal: Knowledge of General Education information will improve Description Including pain rating scale, medication(s)/side effects and non-pharmacologic comfort measures Outcome: Progressing

## 2018-03-02 DIAGNOSIS — N179 Acute kidney failure, unspecified: Secondary | ICD-10-CM | POA: Diagnosis not present

## 2018-03-02 DIAGNOSIS — E1159 Type 2 diabetes mellitus with other circulatory complications: Secondary | ICD-10-CM | POA: Diagnosis not present

## 2018-03-02 DIAGNOSIS — Z8673 Personal history of transient ischemic attack (TIA), and cerebral infarction without residual deficits: Secondary | ICD-10-CM | POA: Diagnosis not present

## 2018-03-02 DIAGNOSIS — I10 Essential (primary) hypertension: Secondary | ICD-10-CM | POA: Diagnosis not present

## 2018-03-02 DIAGNOSIS — E860 Dehydration: Secondary | ICD-10-CM | POA: Diagnosis not present

## 2018-03-02 LAB — BASIC METABOLIC PANEL
Anion gap: 8 (ref 5–15)
BUN: 16 mg/dL (ref 8–23)
CHLORIDE: 109 mmol/L (ref 98–111)
CO2: 24 mmol/L (ref 22–32)
CREATININE: 1.04 mg/dL — AB (ref 0.44–1.00)
Calcium: 7.8 mg/dL — ABNORMAL LOW (ref 8.9–10.3)
GFR calc Af Amer: 59 mL/min — ABNORMAL LOW (ref >60)
GFR calc non Af Amer: 50 mL/min — ABNORMAL LOW (ref >60)
GLUCOSE: 223 mg/dL — AB (ref 70–99)
Potassium: 3.8 mmol/L (ref 3.5–5.1)
Sodium: 141 mmol/L (ref 135–145)

## 2018-03-02 LAB — GLUCOSE, CAPILLARY
GLUCOSE-CAPILLARY: 326 mg/dL — AB (ref 70–99)
Glucose-Capillary: 282 mg/dL — ABNORMAL HIGH (ref 70–99)

## 2018-03-02 LAB — PHOSPHORUS: Phosphorus: 1.9 mg/dL — ABNORMAL LOW (ref 2.5–4.6)

## 2018-03-02 LAB — CBC
HEMATOCRIT: 28.6 % — AB (ref 36.0–46.0)
HEMOGLOBIN: 9.3 g/dL — AB (ref 12.0–15.0)
MCH: 30.5 pg (ref 26.0–34.0)
MCHC: 32.5 g/dL (ref 30.0–36.0)
MCV: 93.8 fL (ref 78.0–100.0)
Platelets: 403 10*3/uL — ABNORMAL HIGH (ref 150–400)
RBC: 3.05 MIL/uL — AB (ref 3.87–5.11)
RDW: 15 % (ref 11.5–15.5)
WBC: 19.7 10*3/uL — ABNORMAL HIGH (ref 4.0–10.5)

## 2018-03-02 LAB — MAGNESIUM: Magnesium: 1.6 mg/dL — ABNORMAL LOW (ref 1.7–2.4)

## 2018-03-02 MED ORDER — GLUCERNA SHAKE PO LIQD: 237.0000 mL | Freq: Three times a day (TID) | ORAL | 0 refills | Status: DC

## 2018-03-02 MED ORDER — ADULT MULTIVITAMIN W/MINERALS CH: 1.0000 | ORAL_TABLET | Freq: Every day | ORAL | Status: DC

## 2018-03-02 MED ORDER — SODIUM PHOSPHATES 45 MMOLE/15ML IV SOLN: 10.0000 mmol | Freq: Once | INTRAVENOUS | Status: DC

## 2018-03-02 MED ORDER — POTASSIUM & SODIUM PHOSPHATES 280-160-250 MG PO PACK
1.0000 | PACK | Freq: Three times a day (TID) | ORAL | Status: DC
  Filled 2018-03-02 (×2): qty 1

## 2018-03-02 MED ORDER — MAGNESIUM OXIDE 400 (241.3 MG) MG PO TABS: 400.0000 mg | ORAL_TABLET | Freq: Two times a day (BID) | ORAL | 0 refills | Status: DC

## 2018-03-02 MED ORDER — AMOXICILLIN-POT CLAVULANATE 875-125 MG PO TABS: 1.0000 | ORAL_TABLET | Freq: Two times a day (BID) | ORAL | 0 refills | Status: DC

## 2018-03-02 MED ORDER — GUAIFENESIN ER 600 MG PO TB12: 600.0000 mg | ORAL_TABLET | Freq: Two times a day (BID) | ORAL | 0 refills | Status: DC

## 2018-03-02 MED ORDER — MEGESTROL ACETATE 400 MG/10ML PO SUSP: 400.0000 mg | Freq: Two times a day (BID) | ORAL | 0 refills | Status: DC

## 2018-03-02 MED ORDER — SODIUM PHOSPHATES 45 MMOLE/15ML IV SOLN: 30.0000 mmol | Freq: Once | INTRAVENOUS | Status: DC

## 2018-03-02 MED ORDER — MAGNESIUM SULFATE 50 % IJ SOLN
3.0000 g | Freq: Once | INTRAVENOUS | Status: DC
  Filled 2018-03-02: qty 6

## 2018-03-02 MED ORDER — POTASSIUM & SODIUM PHOSPHATES 280-160-250 MG PO PACK: 1.0000 | PACK | Freq: Three times a day (TID) | ORAL | 0 refills | Status: DC

## 2018-03-02 MED ORDER — FLUCONAZOLE 100 MG PO TABS: 200.0000 mg | ORAL_TABLET | Freq: Once | ORAL | 0 refills | Status: AC

## 2018-03-02 MED ORDER — MAGNESIUM OXIDE 400 (241.3 MG) MG PO TABS
400.0000 mg | ORAL_TABLET | Freq: Two times a day (BID) | ORAL | Status: DC
  Administered 2018-03-02: 400 mg via ORAL
  Filled 2018-03-02: qty 1

## 2018-03-02 MED ORDER — AMOXICILLIN-POT CLAVULANATE 875-125 MG PO TABS
1.0000 | ORAL_TABLET | Freq: Two times a day (BID) | ORAL | Status: DC
  Administered 2018-03-02: 1 via ORAL
  Filled 2018-03-02: qty 1

## 2018-03-02 NOTE — Progress Notes (Signed)
Patient discharged to home. Patient AVS reviewed and signed. Patient capable re-verbalizing medications and follow-up appointments. IV removed. Patient belongings sent with patient. Patient educated to return to the ED in the event of SOB, chest pain or dizziness.   Dirk Vanaman B. RN 

## 2018-03-03 ENCOUNTER — Telehealth: Payer: Self-pay | Admitting: Family Medicine

## 2018-03-03 ENCOUNTER — Encounter: Payer: Self-pay | Admitting: Radiation Oncology

## 2018-03-03 NOTE — Telephone Encounter (Signed)
Called pt concerning her recent hospital stay. Pt states she is feeling better. Current and discharge medications were reconciled. Pt states she is no longer taking depakote and prednisone. The hospital D/C lisinopril-hctz. They started her on glucerna shakes, diflucan, mucinex, megace, potassium, multivitamins and augmentin. After hour protocol was discussed and pt verified understanding. Pt had a follow up appt already on the schedule and that was changed to a hospital follow up and pt aware when that is. Pt was informed to bring all her medications with her and she states she will.

## 2018-03-03 NOTE — Telephone Encounter (Signed)
Patient daughter called and stated patient had a MRI done while she was at the hospital. She stated she was told to call the office and cancel the other one we ordered.

## 2018-03-04 NOTE — Progress Notes (Signed)
Thoracic Location of Tumor / Histology: Per CT chest without contrast 02/27/18:  IMPRESSION: Enlarging right upper lobe mass with extension to the pleura as well as the right hilum when compared with the prior exam. Some surrounding infiltrative changes are noted as described above a portion of which are likely related to the prior radiation therapy although some localized extension or postobstructive changes would deserve consideration as well.  Patient presented 4 months ago with symptoms of: dizziness, nausea with weight loss of 20 pounds over last month  Biopsies N/A  Tobacco/Marijuana/Snuff/ETOH use: 1 ppd. Has been smoking since age 64.   Past/Anticipated interventions by cardiothoracic surgery, if any: None at this time.  Past/Anticipated interventions by medical oncology, if any: None at this time.  Signs/Symptoms  Weight changes, if any: pt has gained weight R/T Megace  Respiratory complaints, if any: pt denies SOB, denies any difficulty breathing  Hemoptysis, if any: No  Pain issues, if any:  No  SAFETY ISSUES:  Prior radiation? [place] in 5 fx, right lung  Pacemaker/ICD? No  Possible current pregnancy? No  Is the patient on methotrexate? No  Current Complaints / other details:  Pt presents today for reconsult with Dr. Sondra Come for Radiation Oncology. Pt is accompanied by husband and daughter.

## 2018-03-06 NOTE — Discharge Summary (Signed)
Physician Discharge Summary  [name] ATF:[id number] DOB: 07-Aug-1940 DOA: 02/27/2018  PCP: [name], MD  Admit date: 02/27/2018 Discharge date: 03/02/18  Admitted From:Home Disposition: home  Recommendations for Outpatient Follow-up:  1. Follow up with PCP in 1-2 weeks 2. Please obtain BMP/CBC in one week   Discharge Condition stable CODE STATUS; full code Diet recommendation: Heart Healthy Brief/Interim Summary: 77 year old female admitted for [place] , decreased appetite for 4 months.  Discharge Diagnoses:  Active Problems:   Hypertension associated with diabetes ([place])   DM type 2 with diabetic dyslipidemia ([place])   Light smoker   History of CVA (cerebrovascular accident)   S/P recent Left carotid endarterectomy   Lung mass   Malignant neoplasm of lung (HCC)   AKI (acute kidney injury) ([place])   Malnutrition of moderate degree hypotension: From orthostatic hypotension. From dehydration. Marland Kitchen Resolved with hydration.    H/o lung cancer; Further management as per Dr Sondra Come.   Post obstructive pneumonia Discharged her on oral augmentin to complete the course.  Headache; Resolved. MRI BRAIN negative.  H/o CVA left carotid disease: Resume plavix   AKI Resolved.   Discharge Instructions  Discharge Instructions    Diet - low sodium heart healthy   Complete by:  As directed    Discharge instructions   Complete by:  As directed    Please follow up with Dr Sondra Come as recommended in one week.     Allergies as of 03/02/2018      Reactions   Codeine Nausea And Vomiting   Lipitor [atorvastatin] Nausea And Vomiting   Phenergan [promethazine Hcl] Other (See Comments)   Confusion, hallucinations, severe agitation      Medication List    STOP taking these medications   divalproex 500 MG DR tablet Commonly known as:  DEPAKOTE   lisinopril-hydrochlorothiazide 20-12.5 MG tablet Commonly known as:  PRINZIDE,ZESTORETIC   predniSONE 10 MG (48) Tbpk  tablet Commonly known as:  STERAPRED UNI-PAK 48 TAB     TAKE these medications   acetaminophen 325 MG tablet Commonly known as:  TYLENOL Take 650 mg by mouth every 6 (six) hours as needed for mild pain.   amoxicillin-clavulanate 875-125 MG tablet Commonly known as:  AUGMENTIN Take 1 tablet by mouth every 12 (twelve) hours. What changed:  when to take this   clopidogrel 75 MG tablet Commonly known as:  PLAVIX TAKE 1 TABLET BY MOUTH  DAILY   feeding supplement (GLUCERNA SHAKE) Liqd Take 237 mLs by mouth 3 (three) times daily between meals.   glucose blood test strip Use as instructed   guaiFENesin 600 MG 12 hr tablet Commonly known as:  MUCINEX Take 1 tablet (600 mg total) by mouth 2 (two) times daily.   magnesium oxide 400 (241.3 Mg) MG tablet Commonly known as:  MAG-OX Take 1 tablet (400 mg total) by mouth 2 (two) times daily.   megestrol 400 MG/10ML suspension Commonly known as:  MEGACE Take 10 mLs (400 mg total) by mouth 2 (two) times daily.   multivitamin with minerals Tabs tablet Take 1 tablet by mouth daily.   ONE TOUCH ULTRA 2 w/Device Kit 1 Device by Does not apply route 2 (two) times daily.   onetouch ultrasoft lancets Use as instructed   potassium & sodium phosphates 280-160-250 MG Pack Commonly known as:  PHOS-NAK Take 1 packet by mouth 4 (four) times daily -  with meals and at bedtime.   rosuvastatin 20 MG tablet Commonly known as:  CRESTOR  Take 1 tablet (20 mg total) by mouth daily.   sitaGLIPtin-metformin 50-1000 MG tablet Commonly known as:  JANUMET Take 1 tablet by mouth 2 (two) times daily.     ASK your doctor about these medications   fluconazole 100 MG tablet Commonly known as:  DIFLUCAN Take 2 tablets (200 mg total) by mouth once for 1 dose. Ask about: Should I take this medication?      Follow-up Information    [name], MD. Schedule an appointment as soon as possible for a visit in 1 week(s).   Specialty:  Family  Medicine Contact information: [place] [address] [phone]        Gery Pray, MD. Schedule an appointment as soon as possible for a visit in 1 week(s).   Specialty:  Radiation Oncology Contact information: [place] [address] [phone]          Allergies  Allergen Reactions  . Codeine Nausea And Vomiting  . Lipitor [Atorvastatin] Nausea And Vomiting  . Phenergan [Promethazine Hcl] Other (See Comments)    Confusion, hallucinations, severe agitation    Consultations: none  Procedures/Studies: Dg Chest 2 View  Result Date: 02/27/2018 CLINICAL DATA:  Dizziness and weight loss. Current smoker. History of lung cancer. EXAM: CHEST - 2 VIEW COMPARISON:  CT scan October 28, 2017 FINDINGS: There is increased opacity in the right upper lung at the site of the patient's known malignancy. The opacity is somewhat rounded and masslike on the lateral view. This represents a change compared to the October 28, 2017 chest CT scout film. No pneumothorax. No other interval changes. IMPRESSION: Increasing opacity with a somewhat masslike component the right upper lobe with the site of the patient's known malignancy. Whether this represents worsening malignancy, post treatment change around the known malignancy, or a postobstructive infection due to the malignancy is unclear on this x-ray. A CT scan may better evaluate. Electronically Signed   By: Dorise Bullion III M.D   On: 02/27/2018 15:23   Ct Chest Wo Contrast  Result Date: 02/27/2018 CLINICAL DATA:  Increasing density on recent plain film EXAM: CT CHEST WITHOUT CONTRAST TECHNIQUE: Multidetector CT imaging of the chest was performed following the standard protocol without IV contrast. COMPARISON:  Plain film from earlier in the same day, CT of the chest dated 10/28/2017, PET-CT dated 04/26/2017 FINDINGS: Cardiovascular: Limited by the lack of IV contrast. Atherosclerotic changes of the  thoracic aorta and its branches are noted. Coronary calcifications are seen. No cardiac enlargement is noted. The branching pattern of the pulmonary artery is within normal limits. Mediastinum/Nodes: Thoracic inlet is within normal limits. Scattered tiny mediastinal nodes are identified similar to that seen on prior exam. No sizable hilar or mediastinal adenopathy is seen. The esophagus is within normal limits. Lungs/Pleura: The left lung is again well aerated without focal infiltrate or sizable effusion. No parenchymal nodules are seen. The right lung is again well aerated. In the left upper lobe there is again noted a soft tissue mass lesion which measures at least 5.3 by 3.3 cm in greatest dimension significantly increased from the prior study. There is extension to the pleural margin at this time with pleural thickening identified. There is also extension centrally into the right hilum. Some peripheral infiltrative changes are seen. Portion of this may be related to post radiation change as previously described. Some postobstructive infiltrate could not be totally excluded given the enlarging size of the mass. Additionally the possibility of localized  lymphangitic spread would deserve consideration as well. No sizable effusion is seen. Upper Abdomen: Gallbladder has been surgically removed. No other focal abnormality is noted in the upper abdomen. Contrast limits of evaluation of the solid organs. Musculoskeletal: Degenerative changes of the thoracic spine are noted. IMPRESSION: Enlarging right upper lobe mass with extension to the pleura as well as the right hilum when compared with the prior exam. Some surrounding infiltrative changes are noted as described above a portion of which are likely related to the prior radiation therapy although some localized extension or postobstructive changes would deserve consideration as well. Aortic Atherosclerosis (ICD10-I70.0). Electronically Signed   By: Inez Catalina M.D.    On: 02/27/2018 20:16   Mr Brain Wo Contrast  Result Date: 02/28/2018 CLINICAL DATA:  Initial evaluation for persistent headache. History of lung cancer. EXAM: MRI HEAD WITHOUT CONTRAST TECHNIQUE: Multiplanar, multiecho pulse sequences of the brain and surrounding structures were obtained without intravenous contrast. COMPARISON:  Previous MRI from 02/16/2017. FINDINGS: Brain: Generalized age-related cerebral atrophy. Patchy and confluent T2/FLAIR hyperintensity within the periventricular and deep white matter both cerebral hemispheres, most like related chronic small vessel ischemic change, similar to previous. Small remote lacunar infarct at the posterior right lentiform nucleus, unchanged. Left frontal lobe encephalomalacia also stable. No abnormal foci of restricted diffusion to suggest acute or subacute ischemia. Gray-white matter differentiation maintained. No evidence for acute or chronic intracranial hemorrhage. No mass lesion, midline shift or mass effect. No hydrocephalus. No extra-axial fluid collection. No appreciable intracranial metastatic disease on this noncontrast examination. Pituitary gland within normal limits. Vascular: Major intracranial vascular flow voids maintained Skull and upper cervical spine: Craniocervical junction normal. Visualized upper cervical spine within normal limits. No focal marrow replacing lesion. No scalp soft tissue abnormality. Sinuses/Orbits: Globes and orbital soft tissues demonstrate no acute finding. Patient status post ocular lens replacement on the right. Mild chronic ethmoidal and maxillary sinusitis. No air-fluid levels to suggest acute sinus disease. No mastoid effusion. Inner ear structures normal. Other: None. IMPRESSION: 1. No acute intracranial abnormality. 2. Stable atrophy with chronic small vessel ischemic disease and remote left frontal encephalomalacia. 3. No evidence for metastatic disease on this noncontrast examination. Electronically Signed   By:  Jeannine Boga M.D.   On: 02/28/2018 00:57    No chest pain or sob   Subjective:   Discharge Exam: Vitals:   03/02/18 0517 03/02/18 0729  BP: (!) 95/54 (!) 117/58  Pulse: 73 78  Resp: (!) 22 18  Temp: 98.2 F (36.8 C) 98.2 F (36.8 C)  SpO2: 98% 99%   Vitals:   03/01/18 1850 03/01/18 2016 03/02/18 0517 03/02/18 0729  BP: (!) 117/50 (!) 124/50 (!) 95/54 (!) 117/58  Pulse: 89 86 73 78  Resp: (!) 32 (!) 24 (!) 22 18  Temp: 98.2 F (36.8 C) 98.5 F (36.9 C) 98.2 F (36.8 C) 98.2 F (36.8 C)  TempSrc: Oral Oral Oral Oral  SpO2: 98% 99% 98% 99%  Weight:      Height:        General: Pt is alert, awake, not in acute distress Cardiovascular: RRR, S1/S2 +, no rubs, no gallops Respiratory: CTA bilaterally, no wheezing, no rhonchi Abdominal: Soft, NT, ND, bowel sounds + Extremities: no edema, no cyanosis    The results of significant diagnostics from this hospitalization (including imaging, microbiology, ancillary and laboratory) are listed below for reference.     Microbiology: No results found for this or any previous visit (from the past 240 hour(s)).  Labs: BNP (last 3 results) No results for input(s): BNP in the last 8760 hours. Basic Metabolic Panel: Recent Labs  Lab 02/28/18 0346 03/01/18 0426 03/02/18 1143  NA 138 139 141  K 3.6 4.0 3.8  CL 108 110 109  CO2 21* 20* 24  GLUCOSE 225* 235* 223*  BUN 36* 25* 16  CREATININE 1.38* 1.17* 1.04*  CALCIUM 8.0* 8.0* 7.8*  MG  --  1.3* 1.6*  PHOS  --  1.9* 1.9*   Liver Function Tests: Recent Labs  Lab 03/01/18 0426  ALBUMIN 2.4*   No results for input(s): LIPASE, AMYLASE in the last 168 hours. No results for input(s): AMMONIA in the last 168 hours. CBC: Recent Labs  Lab 02/28/18 0346 03/01/18 0426 03/02/18 1143  WBC 20.8* 18.5* 19.7*  NEUTROABS  --  13.9*  --   HGB 10.4* 9.1* 9.3*  HCT 31.7* 28.5* 28.6*  MCV 92.7 96.3 93.8  PLT 460* 383 403*   Cardiac Enzymes: No results for input(s):  CKTOTAL, CKMB, CKMBINDEX, TROPONINI in the last 168 hours. BNP: Invalid input(s): POCBNP CBG: Recent Labs  Lab 03/01/18 1214 03/01/18 1626 03/01/18 2136 03/02/18 0731 03/02/18 1306  GLUCAP 276* 270* 257* 282* 326*   D-Dimer No results for input(s): DDIMER in the last 72 hours. Hgb A1c No results for input(s): HGBA1C in the last 72 hours. Lipid Profile No results for input(s): CHOL, HDL, LDLCALC, TRIG, CHOLHDL, LDLDIRECT in the last 72 hours. Thyroid function studies No results for input(s): TSH, T4TOTAL, T3FREE, THYROIDAB in the last 72 hours.  Invalid input(s): FREET3 Anemia work up No results for input(s): VITAMINB12, FOLATE, FERRITIN, TIBC, IRON, RETICCTPCT in the last 72 hours. Urinalysis    Component Value Date/Time   COLORURINE YELLOW 07/25/2013 [place] 07/25/2013 0952   LABSPEC 1.020 07/04/2017 1051   PHURINE 6.5 07/25/2013 0952   GLUCOSEU >1000 (A) 07/25/2013 0952   HGBUR NEGATIVE 07/25/2013 0952   BILIRUBINUR negative 07/04/2017 1051   KETONESUR negative 07/04/2017 1051   KETONESUR 15 (A) 07/25/2013 0952   PROTEINUR negative 07/04/2017 1051   PROTEINUR 30 (A) 07/25/2013 0952   UROBILINOGEN 0.2 07/25/2013 0952   NITRITE Negative 07/04/2017 1051   NITRITE NEGATIVE 07/25/2013 0952   LEUKOCYTESUR Negative 07/04/2017 1051   Sepsis Labs Invalid input(s): PROCALCITONIN,  WBC,  LACTICIDVEN Microbiology No results found for this or any previous visit (from the past 240 hour(s)).   Time coordinating discharge32 minutes  SIGNED:   Hosie Poisson, MD  [facility] 03/06/2018, 7:26 PM Pager   If 7PM-7AM, please contact night-coverage [email or web address] Password [email or web address]

## 2018-03-07 ENCOUNTER — Ambulatory Visit (INDEPENDENT_AMBULATORY_CARE_PROVIDER_SITE_OTHER): Payer: Medicare Other | Admitting: Family Medicine

## 2018-03-07 ENCOUNTER — Encounter: Payer: Self-pay | Admitting: Family Medicine

## 2018-03-07 VITALS — BP 142/70 | HR 85 | Temp 98.2°F | Ht 66.75 in | Wt 141.0 lb

## 2018-03-07 DIAGNOSIS — E1159 Type 2 diabetes mellitus with other circulatory complications: Secondary | ICD-10-CM | POA: Diagnosis not present

## 2018-03-07 DIAGNOSIS — E860 Dehydration: Secondary | ICD-10-CM | POA: Diagnosis not present

## 2018-03-07 DIAGNOSIS — J189 Pneumonia, unspecified organism: Secondary | ICD-10-CM

## 2018-03-07 DIAGNOSIS — C3490 Malignant neoplasm of unspecified part of unspecified bronchus or lung: Secondary | ICD-10-CM | POA: Diagnosis not present

## 2018-03-07 DIAGNOSIS — E1169 Type 2 diabetes mellitus with other specified complication: Secondary | ICD-10-CM

## 2018-03-07 DIAGNOSIS — E785 Hyperlipidemia, unspecified: Secondary | ICD-10-CM | POA: Diagnosis not present

## 2018-03-07 DIAGNOSIS — I10 Essential (primary) hypertension: Secondary | ICD-10-CM

## 2018-03-07 LAB — POCT GLYCOSYLATED HEMOGLOBIN (HGB A1C): Hemoglobin A1C: 8.9 % — AB (ref 4.0–5.6)

## 2018-03-07 NOTE — Progress Notes (Signed)
   Subjective:    Patient ID: [name], female    DOB: 09-10-1940, 77 y.o.   MRN: [id number]  HPI She is here for follow-up visit after recent hospitalization and treatment for dehydration as well as pneumonia.  Presently she seems to be doing well with no chest pain, shortness of breath, cough or congestion.  Review of record indicates she did have multiple electrolyte abnormalities as well as an elevated white count.  She was sent home on Augmentin as well as Megace to help with her weight.  She does see oncology regularly for treatment of her lung cancer. She does have underlying diabetes with her last A1c of 9.  Review of Systems     Objective:   Physical Exam Alert and in no distress.  Cardiac exam shows regular rhythm without murmurs or gallops.  Lungs are clear to auscultation. A1c is 8.9      Assessment & Plan:  Malignant neoplasm of lung, unspecified laterality, unspecified part of lung ([place])  DM type 2 with diabetic dyslipidemia ([place]) - Plan: HgB A1c  Hypertension associated with diabetes ([place])  Obstructive pneumonia - Plan: CBC with Differential/Platelet, Comprehensive metabolic panel  Dehydration - Plan: CBC with Differential/Platelet, Comprehensive metabolic panel  She will finish the Augmentin and continue on Megace.  We will monitor her weight.  Follow-up on her blood work.  Plan to recheck here in about 3 months or sooner depending on the blood work.

## 2018-03-09 LAB — COMPREHENSIVE METABOLIC PANEL
ALBUMIN: 3.5 g/dL (ref 3.5–4.8)
ALT: 6 IU/L (ref 0–32)
AST: 7 IU/L (ref 0–40)
Albumin/Globulin Ratio: 1.3 (ref 1.2–2.2)
Alkaline Phosphatase: 68 IU/L (ref 39–117)
BUN / CREAT RATIO: 14 (ref 12–28)
BUN: 13 mg/dL (ref 8–27)
Bilirubin Total: <0.2 mg/dL (ref 0.0–1.2)
CALCIUM: 10.2 mg/dL (ref 8.7–10.3)
CO2: 19 mmol/L — ABNORMAL LOW (ref 20–29)
CREATININE: 0.96 mg/dL (ref 0.57–1.00)
Chloride: 104 mmol/L (ref 96–106)
GFR calc Af Amer: 66 mL/min/{1.73_m2} (ref >59)
GFR, EST NON AFRICAN AMERICAN: 57 mL/min/{1.73_m2} — AB (ref >59)
Globulin, Total: 2.6 g/dL (ref 1.5–4.5)
Glucose: 208 mg/dL — ABNORMAL HIGH (ref 65–99)
Potassium: 5.7 mmol/L — ABNORMAL HIGH (ref 3.5–5.2)
SODIUM: 140 mmol/L (ref 134–144)
Total Protein: 6.1 g/dL (ref 6.0–8.5)

## 2018-03-09 LAB — CBC WITH DIFFERENTIAL/PLATELET
Basophils Absolute: 0 10*3/uL (ref 0.0–0.2)
Basos: 0 %
EOS (ABSOLUTE): 0.7 10*3/uL — ABNORMAL HIGH (ref 0.0–0.4)
Eos: 3 %
HEMATOCRIT: 30.7 % — AB (ref 34.0–46.6)
HEMOGLOBIN: 10.1 g/dL — AB (ref 11.1–15.9)
IMMATURE GRANS (ABS): 0.1 10*3/uL (ref 0.0–0.1)
IMMATURE GRANULOCYTES: 1 %
Lymphocytes Absolute: 2.6 10*3/uL (ref 0.7–3.1)
Lymphs: 12 %
MCH: 30.5 pg (ref 26.6–33.0)
MCHC: 32.9 g/dL (ref 31.5–35.7)
MCV: 93 fL (ref 79–97)
MONOCYTES: 7 %
Monocytes Absolute: 1.4 10*3/uL — ABNORMAL HIGH (ref 0.1–0.9)
NEUTROS PCT: 77 %
Neutrophils Absolute: 16.1 10*3/uL — ABNORMAL HIGH (ref 1.4–7.0)
Platelets: 545 10*3/uL — ABNORMAL HIGH (ref 150–450)
RBC: 3.31 x10E6/uL — ABNORMAL LOW (ref 3.77–5.28)
RDW: 16.8 % — ABNORMAL HIGH (ref 12.3–15.4)
WBC: 20.9 10*3/uL — AB (ref 3.4–10.8)

## 2018-03-10 ENCOUNTER — Ambulatory Visit
Admission: RE | Admit: 2018-03-10 | Discharge: 2018-03-10 | Disposition: A | Discharge status: Home / Self Care | Payer: Medicare Other | Source: Ambulatory Visit | Attending: Radiation Oncology | Admitting: Radiation Oncology

## 2018-03-10 ENCOUNTER — Encounter: Payer: Self-pay | Admitting: Radiation Oncology

## 2018-03-10 ENCOUNTER — Other Ambulatory Visit: Payer: Self-pay

## 2018-03-10 VITALS — BP 141/65 | HR 97 | Temp 98.4°F | Resp 18 | Wt 141.4 lb

## 2018-03-10 DIAGNOSIS — Z08 Encounter for follow-up examination after completed treatment for malignant neoplasm: Secondary | ICD-10-CM | POA: Diagnosis not present

## 2018-03-10 DIAGNOSIS — Z7902 Long term (current) use of antithrombotics/antiplatelets: Secondary | ICD-10-CM | POA: Diagnosis not present

## 2018-03-10 DIAGNOSIS — J189 Pneumonia, unspecified organism: Secondary | ICD-10-CM | POA: Diagnosis not present

## 2018-03-10 DIAGNOSIS — Z923 Personal history of irradiation: Secondary | ICD-10-CM | POA: Diagnosis not present

## 2018-03-10 DIAGNOSIS — I2510 Atherosclerotic heart disease of native coronary artery without angina pectoris: Secondary | ICD-10-CM | POA: Insufficient documentation

## 2018-03-10 DIAGNOSIS — C3411 Malignant neoplasm of upper lobe, right bronchus or lung: Secondary | ICD-10-CM | POA: Diagnosis not present

## 2018-03-10 DIAGNOSIS — Z7984 Long term (current) use of oral hypoglycemic drugs: Secondary | ICD-10-CM | POA: Diagnosis not present

## 2018-03-10 DIAGNOSIS — I700 Atherosclerosis of aorta: Secondary | ICD-10-CM | POA: Diagnosis not present

## 2018-03-10 DIAGNOSIS — Z79899 Other long term (current) drug therapy: Secondary | ICD-10-CM | POA: Diagnosis not present

## 2018-03-10 DIAGNOSIS — C3491 Malignant neoplasm of unspecified part of right bronchus or lung: Secondary | ICD-10-CM

## 2018-03-10 DIAGNOSIS — Z885 Allergy status to narcotic agent status: Secondary | ICD-10-CM | POA: Insufficient documentation

## 2018-03-10 DIAGNOSIS — Z888 Allergy status to other drugs, medicaments and biological substances status: Secondary | ICD-10-CM | POA: Insufficient documentation

## 2018-03-10 NOTE — Progress Notes (Signed)
Radiation Oncology         [phone] ________________________________  Name: [name] MRN: [id number]  Date: 03/10/2018  DOB: 1941/03/02  Re-Evaluation Visit Note  CC: [name], MD  Hosie Poisson, MD    ICD-10-CM   1. Non-small cell cancer of right lung [facility]) C34.91     Diagnosis:  Solitary pulmonary nodule presenting in the right upper lobe   Interval Since Last Radiation:  8 months   Radiation treatment dates:   06/11/17-06/21/17  Site/dose:   Right lung / 50 Gy delivered in 5 fractions (SBRT)  Narrative:  The patient returns today for routine follow-up. She is accompanied by her husband and her daughter today. She was recently evaluated in the hospital on 02/27/2018 and admitted for 4 days with abx Rx due to pneumonia.    She has a follow up appointment on 04/21/2018 in Radiation Oncology.   She first proceed with a CXR on 02/27/2018 showed: Increasing opacity with a somewhat masslike component the right upper lobe with the site of the patient's known malignancy. Whether this represents worsening malignancy, post treatment change around the known malignancy, or a postobstructive infection due to the malignancy is unclear on this x-ray.  Next a CT Chest wo contrast on 02/27/2018 showed: Enlarging right upper lobe mass with extension to the pleura as well as the right hilum when compared with the prior exam. Some surrounding infiltrative changes are noted as described above a portion of which are likely related to the prior radiation therapy although some localized extension or postobstructive changes would deserve consideration as well. Aortic Atherosclerosis.  She underwent a CT Head wo contrast on 01/24/2018 showed: Chronic left frontal infarct. No acute intracranial abnormalities.   She also had a CT Chest wo contrast on 10/28/2017 that showed:  Spiculated right upper lobe lung mass is not appreciably changed in size. New mild post treatment change surrounding the  right upper lobe lung mass. No evidence of metastatic disease in the chest. Three-vessel coronary atherosclerosis. Aortic Atherosclerosis (ICD10-I70.0) and Emphysema    On review of systems, she reports increased fatigue. she denies CP, hemoptysis, fever, lower back pain, and any other symptoms. Pertinent positives are listed and detailed within the above HPI.                 ALLERGIES:  is allergic to codeine; lipitor [atorvastatin]; and phenergan [promethazine hcl].  Meds: Current Outpatient Medications  Medication Sig Dispense Refill  . acetaminophen (TYLENOL) 325 MG tablet Take 650 mg by mouth every 6 (six) hours as needed for mild pain.    Marland Kitchen amoxicillin-clavulanate (AUGMENTIN) 875-125 MG tablet Take 1 tablet by mouth every 12 (twelve) hours. 10 tablet 0  . Blood Glucose Monitoring Suppl (ONE TOUCH ULTRA 2) w/Device KIT 1 Device by Does not apply route 2 (two) times daily. 1 each 0  . clopidogrel (PLAVIX) 75 MG tablet TAKE 1 TABLET BY MOUTH  DAILY 90 tablet 3  . fluconazole (DIFLUCAN) 100 MG tablet Take 100 mg by mouth daily.    Marland Kitchen glucose blood (ONE TOUCH ULTRA TEST) test strip Use as instructed 100 each 12  . guaiFENesin (MUCINEX) 600 MG 12 hr tablet Take 1 tablet (600 mg total) by mouth 2 (two) times daily. 20 tablet 0  . Lancets (ONETOUCH ULTRASOFT) lancets Use as instructed 100 each 12  . magnesium oxide (MAG-OX) 400 (241.3 Mg) MG tablet Take 1 tablet (400 mg total) by mouth 2 (two) times daily. 14 tablet 0  .  megestrol (MEGACE) 400 MG/10ML suspension Take 10 mLs (400 mg total) by mouth 2 (two) times daily. 240 mL 0  . Multiple Vitamin (MULTIVITAMIN WITH MINERALS) TABS tablet Take 1 tablet by mouth daily. 30 tablet   . rosuvastatin (CRESTOR) 20 MG tablet Take 1 tablet (20 mg total) by mouth daily. 90 tablet 3  . sitaGLIPtin-metformin (JANUMET) 50-1000 MG tablet Take 1 tablet by mouth 2 (two) times daily. 120 tablet 0  . feeding supplement, GLUCERNA SHAKE, (GLUCERNA SHAKE) LIQD Take 237  mLs by mouth 3 (three) times daily between meals. (Patient not taking: Reported on 03/07/2018)  0  . potassium & sodium phosphates (PHOS-NAK) 280-160-250 MG PACK Take 1 packet by mouth 4 (four) times daily -  with meals and at bedtime. (Patient not taking: Reported on 03/10/2018) 15 packet 0   No current facility-administered medications for this encounter.     Physical Findings: The patient is in no acute distress. Patient is alert and oriented.  weight is 141 lb 6.4 oz (64.1 kg). Her oral temperature is 98.4 F (36.9 C). Her blood pressure is 141/65 (abnormal) and her pulse is 97. Her respiration is 18 and oxygen saturation is 100%.   Lungs are clear to auscultation bilaterally. Heart has regular rate and rhythm. No palpable cervical, supraclavicular, or axillary adenopathy. Abdomen soft, non-tender, normal bowel sounds.   Lab Findings: Lab Results  Component Value Date   WBC 20.9 (HH) 03/07/2018   HGB 10.1 (L) 03/07/2018   HCT 30.7 (L) 03/07/2018   MCV 93 03/07/2018   PLT 545 (H) 03/07/2018    Radiographic Findings: Dg Chest 2 View  Result Date: 02/27/2018 CLINICAL DATA:  Dizziness and weight loss. Current smoker. History of lung cancer. EXAM: CHEST - 2 VIEW COMPARISON:  CT scan October 28, 2017 FINDINGS: There is increased opacity in the right upper lung at the site of the patient's known malignancy. The opacity is somewhat rounded and masslike on the lateral view. This represents a change compared to the October 28, 2017 chest CT scout film. No pneumothorax. No other interval changes. IMPRESSION: Increasing opacity with a somewhat masslike component the right upper lobe with the site of the patient's known malignancy. Whether this represents worsening malignancy, post treatment change around the known malignancy, or a postobstructive infection due to the malignancy is unclear on this x-ray. A CT scan may better evaluate. Electronically Signed   By: Dorise Bullion III M.D   On: 02/27/2018  15:23   Ct Chest Wo Contrast  Result Date: 02/27/2018 CLINICAL DATA:  Increasing density on recent plain film EXAM: CT CHEST WITHOUT CONTRAST TECHNIQUE: Multidetector CT imaging of the chest was performed following the standard protocol without IV contrast. COMPARISON:  Plain film from earlier in the same day, CT of the chest dated 10/28/2017, PET-CT dated 04/26/2017 FINDINGS: Cardiovascular: Limited by the lack of IV contrast. Atherosclerotic changes of the thoracic aorta and its branches are noted. Coronary calcifications are seen. No cardiac enlargement is noted. The branching pattern of the pulmonary artery is within normal limits. Mediastinum/Nodes: Thoracic inlet is within normal limits. Scattered tiny mediastinal nodes are identified similar to that seen on prior exam. No sizable hilar or mediastinal adenopathy is seen. The esophagus is within normal limits. Lungs/Pleura: The left lung is again well aerated without focal infiltrate or sizable effusion. No parenchymal nodules are seen. The right lung is again well aerated. In the left upper lobe there is again noted a soft tissue mass lesion which measures at  least 5.3 by 3.3 cm in greatest dimension significantly increased from the prior study. There is extension to the pleural margin at this time with pleural thickening identified. There is also extension centrally into the right hilum. Some peripheral infiltrative changes are seen. Portion of this may be related to post radiation change as previously described. Some postobstructive infiltrate could not be totally excluded given the enlarging size of the mass. Additionally the possibility of localized lymphangitic spread would deserve consideration as well. No sizable effusion is seen. Upper Abdomen: Gallbladder has been surgically removed. No other focal abnormality is noted in the upper abdomen. Contrast limits of evaluation of the solid organs. Musculoskeletal: Degenerative changes of the thoracic  spine are noted. IMPRESSION: Enlarging right upper lobe mass with extension to the pleura as well as the right hilum when compared with the prior exam. Some surrounding infiltrative changes are noted as described above a portion of which are likely related to the prior radiation therapy although some localized extension or postobstructive changes would deserve consideration as well. Aortic Atherosclerosis (ICD10-I70.0). Electronically Signed   By: Inez Catalina M.D.   On: 02/27/2018 20:16   Mr Brain Wo Contrast  Result Date: 02/28/2018 CLINICAL DATA:  Initial evaluation for persistent headache. History of lung cancer. EXAM: MRI HEAD WITHOUT CONTRAST TECHNIQUE: Multiplanar, multiecho pulse sequences of the brain and surrounding structures were obtained without intravenous contrast. COMPARISON:  Previous MRI from 02/16/2017. FINDINGS: Brain: Generalized age-related cerebral atrophy. Patchy and confluent T2/FLAIR hyperintensity within the periventricular and deep white matter both cerebral hemispheres, most like related chronic small vessel ischemic change, similar to previous. Small remote lacunar infarct at the posterior right lentiform nucleus, unchanged. Left frontal lobe encephalomalacia also stable. No abnormal foci of restricted diffusion to suggest acute or subacute ischemia. Gray-white matter differentiation maintained. No evidence for acute or chronic intracranial hemorrhage. No mass lesion, midline shift or mass effect. No hydrocephalus. No extra-axial fluid collection. No appreciable intracranial metastatic disease on this noncontrast examination. Pituitary gland within normal limits. Vascular: Major intracranial vascular flow voids maintained Skull and upper cervical spine: Craniocervical junction normal. Visualized upper cervical spine within normal limits. No focal marrow replacing lesion. No scalp soft tissue abnormality. Sinuses/Orbits: Globes and orbital soft tissues demonstrate no acute finding.  Patient status post ocular lens replacement on the right. Mild chronic ethmoidal and maxillary sinusitis. No air-fluid levels to suggest acute sinus disease. No mastoid effusion. Inner ear structures normal. Other: None. IMPRESSION: 1. No acute intracranial abnormality. 2. Stable atrophy with chronic small vessel ischemic disease and remote left frontal encephalomalacia. 3. No evidence for metastatic disease on this noncontrast examination. Electronically Signed   By: Jeannine Boga M.D.   On: 02/28/2018 00:57    Impression:  Solitary pulmonary nodule presenting in the right upper lobe   Recent admission for probable pneumonia. Recent CT Chest questioned progression of disease but is hard to evaluate this issue given her recent episode of pneumonia and scar tissue from SBRT. It is too early to obtain a PET scan in light of her recent pneumonia. Patient also already has a follow up appointment with me in October. I would recommend keeping that appointment as that should be far enough out from her recent admission to give a good enough picture to determine if it is scar tissue or recerrence.   Plan:  Follow up as scheduled in October in Radiation Oncology. PET CT Scan soon after that appointment to factor out whether these changes on recent chest CT  scan are recurrence or related to radiation changes typically seen with SBRT.  ____________________________________   Blair Promise, PhD, MD    This document serves as a record of services personally performed by Gery Pray, MD. It was created on his behalf by [facility], a trained medical scribe. The creation of this record is based on the scribe's personal observations and the provider's statements to them. This document has been checked and approved by the attending provider.

## 2018-03-30 ENCOUNTER — Emergency Department (HOSPITAL_COMMUNITY): Payer: Medicare Other

## 2018-03-30 ENCOUNTER — Emergency Department (HOSPITAL_COMMUNITY)
Admission: EM | Admit: 2018-03-30 | Discharge: 2018-03-30 | Disposition: A | Discharge status: Home / Self Care | Payer: Medicare Other | Attending: Emergency Medicine | Admitting: Emergency Medicine

## 2018-03-30 ENCOUNTER — Other Ambulatory Visit: Payer: Self-pay

## 2018-03-30 ENCOUNTER — Encounter (HOSPITAL_COMMUNITY): Payer: Self-pay | Admitting: Emergency Medicine

## 2018-03-30 DIAGNOSIS — R51 Headache: Secondary | ICD-10-CM | POA: Diagnosis not present

## 2018-03-30 DIAGNOSIS — Z79899 Other long term (current) drug therapy: Secondary | ICD-10-CM | POA: Diagnosis not present

## 2018-03-30 DIAGNOSIS — I10 Essential (primary) hypertension: Secondary | ICD-10-CM | POA: Insufficient documentation

## 2018-03-30 DIAGNOSIS — R739 Hyperglycemia, unspecified: Secondary | ICD-10-CM

## 2018-03-30 DIAGNOSIS — F17210 Nicotine dependence, cigarettes, uncomplicated: Secondary | ICD-10-CM | POA: Diagnosis not present

## 2018-03-30 DIAGNOSIS — E1165 Type 2 diabetes mellitus with hyperglycemia: Secondary | ICD-10-CM | POA: Diagnosis not present

## 2018-03-30 DIAGNOSIS — R202 Paresthesia of skin: Secondary | ICD-10-CM | POA: Diagnosis not present

## 2018-03-30 DIAGNOSIS — R200 Anesthesia of skin: Secondary | ICD-10-CM | POA: Insufficient documentation

## 2018-03-30 DIAGNOSIS — R05 Cough: Secondary | ICD-10-CM | POA: Diagnosis not present

## 2018-03-30 LAB — COMPREHENSIVE METABOLIC PANEL
ALT: 12 U/L (ref 0–44)
AST: 12 U/L — AB (ref 15–41)
Albumin: 3.3 g/dL — ABNORMAL LOW (ref 3.5–5.0)
Alkaline Phosphatase: 78 U/L (ref 38–126)
Anion gap: 13 (ref 5–15)
BUN: 10 mg/dL (ref 8–23)
CO2: 23 mmol/L (ref 22–32)
CREATININE: 0.91 mg/dL (ref 0.44–1.00)
Calcium: 9.7 mg/dL (ref 8.9–10.3)
Chloride: 102 mmol/L (ref 98–111)
GFR, EST NON AFRICAN AMERICAN: 59 mL/min — AB (ref >60)
Glucose, Bld: 227 mg/dL — ABNORMAL HIGH (ref 70–99)
Potassium: 3.4 mmol/L — ABNORMAL LOW (ref 3.5–5.1)
Sodium: 138 mmol/L (ref 135–145)
TOTAL PROTEIN: 6.9 g/dL (ref 6.5–8.1)
Total Bilirubin: 0.6 mg/dL (ref 0.3–1.2)

## 2018-03-30 LAB — URINALYSIS, ROUTINE W REFLEX MICROSCOPIC
BACTERIA UA: NONE SEEN
BILIRUBIN URINE: NEGATIVE
Glucose, UA: >=500 mg/dL — AB
Hgb urine dipstick: NEGATIVE
KETONES UR: 5 mg/dL — AB
Nitrite: NEGATIVE
PROTEIN: NEGATIVE mg/dL
Specific Gravity, Urine: 1.024 (ref 1.005–1.030)
pH: 5 (ref 5.0–8.0)

## 2018-03-30 LAB — DIFFERENTIAL
ABS IMMATURE GRANULOCYTES: 0.1 10*3/uL (ref 0.0–0.1)
Basophils Absolute: 0.1 10*3/uL (ref 0.0–0.1)
Basophils Relative: 0 %
Eosinophils Absolute: 0.5 10*3/uL (ref 0.0–0.7)
Eosinophils Relative: 3 %
Immature Granulocytes: 1 %
LYMPHS ABS: 1.8 10*3/uL (ref 0.7–4.0)
Lymphocytes Relative: 10 %
MONO ABS: 1.1 10*3/uL — AB (ref 0.1–1.0)
MONOS PCT: 7 %
NEUTROS ABS: 13.4 10*3/uL — AB (ref 1.7–7.7)
NEUTROS PCT: 79 %

## 2018-03-30 LAB — CBC
HCT: 33.2 % — ABNORMAL LOW (ref 36.0–46.0)
Hemoglobin: 10.5 g/dL — ABNORMAL LOW (ref 12.0–15.0)
MCH: 29.1 pg (ref 26.0–34.0)
MCHC: 31.6 g/dL (ref 30.0–36.0)
MCV: 92 fL (ref 78.0–100.0)
PLATELETS: 690 10*3/uL — AB (ref 150–400)
RBC: 3.61 MIL/uL — AB (ref 3.87–5.11)
RDW: 15.2 % (ref 11.5–15.5)
WBC: 16.9 10*3/uL — ABNORMAL HIGH (ref 4.0–10.5)

## 2018-03-30 LAB — I-STAT TROPONIN, ED: TROPONIN I, POC: 0 ng/mL (ref 0.00–0.08)

## 2018-03-30 LAB — PROTIME-INR
INR: 1.05
PROTHROMBIN TIME: 13.6 s (ref 11.4–15.2)

## 2018-03-30 LAB — APTT: aPTT: 30 seconds (ref 24–36)

## 2018-03-30 NOTE — ED Notes (Signed)
EDP at bedside  

## 2018-03-30 NOTE — ED Notes (Signed)
ED Provider at bedside. 

## 2018-03-30 NOTE — ED Provider Notes (Signed)
[facility] EMERGENCY DEPARTMENT Provider Note   CSN: [id number] Arrival date & time: 03/30/18  1716     History   Chief Complaint Chief Complaint  Patient presents with  . Stroke Symptoms    HPI Nichole NOVINGER is a 77 y.o. female for evaluation of left arm numbness and left-sided facial numbness.  Patient states at 430 this afternoon she was eating lunch when her left went numb.  She was unable to hold her hand up.  This lasted for 15 to 20 minutes before resolving.  She also experienced left-sided facial numbness, was told that her mouth was unequal.  This also resolved without intervention.  She had a mild headache at the time, this has resolved.  She is currently without numbness or weakness.  She denies current headache, vision changes, slurred speech, fevers, chills, chest pain, shortness of breath, nausea, vomiting, abdominal pain, urinary symptoms, abnormal bowel movements.  She denies fall, trauma, or injury.  She is still on Plavix.  Has a history of stroke in 2014, none since.  Patient states she has had intermittent episodes of numbness like this in the past, has had negative work-ups recently in the ER.  Additional history obtained from chart review, patient with a history of carotid disease, diabetes, lung cancer status post chemotherapy, CVA, vertigo.  HPI  Past Medical History:  Diagnosis Date  . Carotid artery occlusion   . Clostridium difficile infection 06/2013  . Diabetes mellitus    takes Janumet daily  . Dyslipidemia    takes Crestor daily  . Gallstones   . GERD (gastroesophageal reflux disease)   . Heart murmur    hx of  . History of radiation therapy 06/11/17-06/21/17   right lung 50 Gy in 5 fractions  . Hypertension    takes Prinzide and Verapamil daily  . Impaired speech    from stroke  . Neuropathy, diabetic ([place])   . Pneumonia   . PONV (postoperative nausea and vomiting)   . Smoker   . Stroke ([place]) 06/25/13  . Vertigo    but doesn't take any meds    Patient Active Problem List   Diagnosis Date Noted  . Malnutrition of moderate degree 03/01/2018  . AKI (acute kidney injury) ([place]) 02/27/2018  . Malignant neoplasm of lung ([place]) 07/04/2017  . Non-small cell lung cancer ([place]) 05/28/2017  . Pneumothorax after biopsy 05/23/2017  . Migraine equivalent 04/15/2017  . Smoker 04/15/2017  . Lung mass 02/16/2017  . Transient cerebral ischemia 02/15/2017  . Hyperlipidemia associated with type 2 diabetes mellitus ([place]) 03/07/2016  . Adenomatous colon polyp s/p laparoscopic right colectomy 09/02/15 09/02/2015  . Hemispheric carotid artery syndrome 04/07/2015  . Cataract associated with type 2 diabetes mellitus ([place]) 03/08/2015  . S/P recent Left carotid endarterectomy 07/14/2013  . History of CVA (cerebrovascular accident) 06/27/2013  . Hypertension associated with diabetes ([place]) 01/04/2011  . DM type 2 with diabetic dyslipidemia ([place]) 01/04/2011  . Light smoker 01/04/2011    Past Surgical History:  Procedure Laterality Date  . cataract removed Right   . CHOLECYSTECTOMY    . ENDARTERECTOMY Left 07/14/2013   Procedure: ENDARTERECTOMY CAROTID-LEFT;  Surgeon: Elam Dutch, MD;  Location: [facility];  Service: Vascular;  Laterality: Left;  . IR PERC PLEURAL DRAIN W/INDWELL CATH W/IMG GUIDE  05/23/2017  . KNEE SURGERY Left 35yr ago  . LAPAROSCOPIC PARTIAL COLECTOMY N/A 09/02/2015   Procedure: LAPAROSCOPIC PARTIAL RIGHT COLECTOMY;  Surgeon: ALeighton Ruff MD;  Location: [facility];  Service: General;  Laterality: N/A;  . PATCH ANGIOPLASTY Left 07/14/2013   Procedure: LEFT CAROTID ARTERY PATCH ANGIOPLASTY;  Surgeon: Elam Dutch, MD;  Location: [facility];  Service: Vascular;  Laterality: Left;     OB History   None      Home Medications    Prior to Admission medications   Medication Sig Start Date End Date Taking? Authorizing Provider  acetaminophen (TYLENOL) 325 MG tablet Take 325 mg by mouth every 6 (six) hours  as needed for mild pain.    Yes [provider]  clopidogrel (PLAVIX) 75 MG tablet TAKE 1 TABLET BY MOUTH  DAILY Patient taking differently: Take 75 mg by mouth daily.  12/12/17  Yes [name], MD  megestrol (MEGACE) 400 MG/10ML suspension Take 10 mLs (400 mg total) by mouth 2 (two) times daily. Patient taking differently: Take 400 mg by mouth daily as needed (to increase appetite).  03/02/18  Yes Hosie Poisson, MD  Multiple Vitamin (MULTIVITAMIN WITH MINERALS) TABS tablet Take 1 tablet by mouth daily. 03/03/18  Yes Hosie Poisson, MD  rosuvastatin (CRESTOR) 20 MG tablet Take 1 tablet (20 mg total) by mouth daily. Patient taking differently: Take 20 mg by mouth at bedtime.  02/25/18  Yes [name], MD  sitaGLIPtin-metformin (JANUMET) 50-1000 MG tablet Take 1 tablet by mouth 2 (two) times daily. Patient taking differently: Take 1 tablet by mouth 2 (two) times daily before a meal.  11/13/17  Yes [name], MD  Blood Glucose Monitoring Suppl (ONE TOUCH ULTRA 2) w/Device KIT 1 Device by Does not apply route 2 (two) times daily. 08/21/17   [name], MD  feeding supplement, GLUCERNA SHAKE, (GLUCERNA SHAKE) LIQD Take 237 mLs by mouth 3 (three) times daily between meals. Patient not taking: Reported on 03/07/2018 03/02/18   Hosie Poisson, MD  glucose blood (ONE TOUCH ULTRA TEST) test strip Use as instructed 02/10/18   [name], MD  guaiFENesin (MUCINEX) 600 MG 12 hr tablet Take 1 tablet (600 mg total) by mouth 2 (two) times daily. Patient not taking: Reported on 03/30/2018 03/02/18   Hosie Poisson, MD  Lancets [facility] ULTRASOFT) lancets Use as instructed 08/21/17   [name], MD  magnesium oxide (MAG-OX) 400 (241.3 Mg) MG tablet Take 1 tablet (400 mg total) by mouth 2 (two) times daily. Patient not taking: Reported on 03/30/2018 03/02/18   Hosie Poisson, MD  potassium & sodium phosphates (PHOS-NAK) 280-160-250 MG PACK Take 1 packet by mouth 4 (four) times daily -  with meals  and at bedtime. Patient not taking: Reported on 03/10/2018 03/02/18   Hosie Poisson, MD    Family History Family History  Problem Relation Age of Onset  . Leukemia Mother   . Diabetes Mother   . Cancer Father        esophageal ca  . Heart disease Father   . Diabetes Maternal Aunt   . Diabetes Maternal Uncle   . Diabetes Brother   . Diabetes Sister   . Heart attack Brother        x 2    Social History Social History   Tobacco Use  . Smoking status: Current Every Day Smoker    Packs/day: 0.50    Years: 60.00    Pack years: 30.00    Types: Cigarettes  . Smokeless tobacco: Never Used  Substance Use Topics  . Alcohol use: No    Alcohol/week: 0.0 standard drinks  . Drug use: No  Allergies   Codeine; Lipitor [atorvastatin]; and Phenergan [promethazine hcl]   Review of Systems Review of Systems  Neurological: Positive for numbness (Resolved) and headaches (Resolved).  All other systems reviewed and are negative.    Physical Exam Updated Vital Signs BP (!) 141/69 (BP Location: Right Arm)   Pulse 91   Temp 98 F (36.7 C) (Oral)   Resp 16   Ht _0  (1.651 m)   Wt 64.1 kg   SpO2 100%   BMI 23.52 kg/m   Physical Exam  Constitutional: She is oriented to person, place, and time. She appears well-developed and well-nourished. No distress.  Elderly female in no acute distress  HENT:  Head: Normocephalic and atraumatic.  OP clear without tonsillar swelling or exudate.  Uvula midline with equal palate rise.  TMs nonerythematous and nonbulging bilaterally.  Eyes: Pupils are equal, round, and reactive to light. Conjunctivae and EOM are normal.  Neck: Normal range of motion. Neck supple.  Cardiovascular: Normal rate, regular rhythm and intact distal pulses.  Pulmonary/Chest: Effort normal and breath sounds normal. No respiratory distress. She has no wheezes.  Abdominal: Soft. She exhibits no distension and no mass. There is no tenderness. There is no rebound and no  guarding.  Musculoskeletal: Normal range of motion. She exhibits no edema, tenderness or deformity.  Strength intact x4.  Sensation intact x4.  Radial pedal pulses intact bilaterally.  Soft compartments.  Neurological: She is alert and oriented to person, place, and time. No cranial nerve deficit or sensory deficit. Coordination normal.  No obvious neurologic deficits.  Nose to finger intact.  Grip strength intact.  CN intact.  Fine movement and coordination intact.  Negative pronator drift.  Skin: Skin is warm and dry. Capillary refill takes less than 2 seconds.  Psychiatric: She has a normal mood and affect.  Nursing note and vitals reviewed.    ED Treatments / Results  Labs (all labs ordered are listed, but only abnormal results are displayed) Labs Reviewed  CBC - Abnormal; Notable for the following components:      Result Value   WBC 16.9 (*)    RBC 3.61 (*)    Hemoglobin 10.5 (*)    HCT 33.2 (*)    Platelets 690 (*)    All other components within normal limits  DIFFERENTIAL - Abnormal; Notable for the following components:   Neutro Abs 13.4 (*)    Monocytes Absolute 1.1 (*)    All other components within normal limits  COMPREHENSIVE METABOLIC PANEL - Abnormal; Notable for the following components:   Potassium 3.4 (*)    Glucose, Bld 227 (*)    Albumin 3.3 (*)    AST 12 (*)    GFR calc non Af Amer 59 (*)    All other components within normal limits  URINALYSIS, ROUTINE W REFLEX MICROSCOPIC - Abnormal; Notable for the following components:   APPearance HAZY (*)    Glucose, UA >=500 (*)    Ketones, ur 5 (*)    Leukocytes, UA TRACE (*)    All other components within normal limits  URINE CULTURE  PROTIME-INR  APTT  I-STAT TROPONIN, ED    EKG None  Radiology Dg Chest 2 View  Result Date: 03/30/2018 CLINICAL DATA:  Cough EXAM: CHEST - 2 VIEW COMPARISON:  CT 02/27/2018, radiograph 02/27/2018 FINDINGS: A right upper lobe lung mass appears increased in size and more  consolidated. There are adjacent pleural changes. Stable cardiomediastinal silhouette with aortic atherosclerosis. No pneumothorax. There are  degenerative changes of the spine. IMPRESSION: Increased size and density of right upper lobe lung mass with adjacent pleural changes as compared with radiograph 02/27/2018. Otherwise no significant interval change. Electronically Signed   By: Donavan Foil M.D.   On: 03/30/2018 19:41   Mr Brain Wo Contrast  Result Date: 03/30/2018 CLINICAL DATA:  Left hand weakness and numbness beginning 1630 hours. Temporal headache. EXAM: MRI HEAD WITHOUT CONTRAST TECHNIQUE: Multiplanar, multiecho pulse sequences of the brain and surrounding structures were obtained without intravenous contrast. COMPARISON:  02/28/2018 FINDINGS: Brain: Diffusion imaging does not show any acute or subacute infarction. No focal insult affects the brainstem or cerebellum there is generalized atrophy. Cerebral hemispheres show generalized atrophy with an old left frontal cortical and subcortical infarction. There are old small vessel infarctions within the deep and subcortical white matter. No evidence of mass lesion, hemorrhage, hydrocephalus or extra-axial collection. Vascular: Major vessels at the base of the brain show flow. Skull and upper cervical spine: Negative Sinuses/Orbits: Mucosal inflammatory changes of the right maxillary sinus. Other sinuses are clear. Orbits are negative. Other: None IMPRESSION: No acute or subacute finding by MRI. Generalized atrophy. Old left frontal cortical and subcortical infarction. Chronic small-vessel ischemic changes of the hemispheric white matter. Electronically Signed   By: Nelson Chimes M.D.   On: 03/30/2018 19:26    Procedures Procedures (including critical care time)  Medications Ordered in ED Medications - No data to display   Initial Impression / Assessment and Plan / ED Course  I have reviewed the triage vital signs and the nursing  notes.  Pertinent labs & imaging results that were available during my care of the patient were reviewed by me and considered in my medical decision making (see chart for details).     Presenting for evaluation of left hand and left facial numbness.  Physical exam reassuring, symptoms have resolved completely.  No new neurologic deficits or focal neuro episodes on exam.  Will obtain labs, urine, chest x-ray, CT, and MRI for further evaluation.  I obtained prior to CT, will discontinue CT.  Chest x-ray shows enlarging mass, but no new pneumonia, pneumothorax, or effusion.  Viewed and interpreted by me.  Labs reassuring, troponin negative.  Mild leukocytosis at 16, however patient with leukocytosis of 20 at her last visit.  Glucose mildly elevated at 227, no DKA.  Urine with trace leuks, no bacteria.  Culture sent, but no urinary symptoms and will not treat for UTI at this time.  MRI negative for stroke.  Discussed with attending, Dr. Ronnald Nian evaluated the patient.  Will consult neurology for further evaluation.  Discussed with Dr. Cheral Marker from neurology.  Extensive chart review shows recent carotid ultrasound showing no progression of carotid disease, patient with CTA 1 year ago without signs of occlusion.  Echo within 1 year.  Patient is already on treatment for a possible TIA.  Imaging was performed recently, and without signs of progression of disease.  No indication for dual antiplatelet therapy or anticoagulation, patient is already on Plavix.  Dr. Cheral Marker not believe patient would benefit from hospital admission, can complete any further work-up outpatient.  Discussed with patient and family.  Discussed follow-up with PCP, especially regarding increasing lung mass, diabetes control, and recurrence of symptoms.  Patient has outpatient neurologist, follow-up for further evaluation of symptoms.  Discussed pt is to return to the ED if symptoms return or worsen.  At this time, patient appears safe for  discharge.  Patient and family state they understand  and agree to plan.   Final Clinical Impressions(s) / ED Diagnoses   Final diagnoses:  Numbness of left hand  Numbness and tingling of left side of face  Hyperglycemia    ED Discharge Orders    None       Franchot Heidelberg, PA-C 03/30/18 [id number]    Lennice Sites, DO 03/30/18 2315

## 2018-03-30 NOTE — Discharge Instructions (Addendum)
Continue taking home medications as prescribed. Follow-up with your primary care doctor to discuss your symptoms, your diabetes control, and increasing size in your lung mass. You may follow-up with neurology for further evaluation of your symptoms as well. If you have recurrence of symptoms, including numbness, tingling, speech deficits, return to the emergency room for evaluation.  Return with any new, worsening, or concerning symptoms.

## 2018-03-30 NOTE — ED Triage Notes (Signed)
Pt reports L hand weakness and numbness that started at 4:30pm and lasted approx 20 min.  Also reports numbness to L side of face that has also resolved.  Reports temporal headache that started on the way to the hospital and has gotten better but not resolved.  Also reports L ear pain.  No arm drift.  No facial droop.  Speech clear.  Reports she also had L hand numbness on Friday that resolved.

## 2018-04-01 LAB — URINE CULTURE: CULTURE: NO GROWTH

## 2018-04-09 ENCOUNTER — Other Ambulatory Visit: Payer: Self-pay

## 2018-04-09 ENCOUNTER — Emergency Department (HOSPITAL_COMMUNITY): Payer: Medicare Other

## 2018-04-09 ENCOUNTER — Encounter (HOSPITAL_COMMUNITY): Payer: Self-pay | Admitting: Emergency Medicine

## 2018-04-09 ENCOUNTER — Observation Stay (HOSPITAL_COMMUNITY)
Admission: EM | Admit: 2018-04-09 | Discharge: 2018-04-10 | Disposition: A | Discharge status: Home / Self Care | Payer: Medicare Other | Attending: Internal Medicine | Admitting: Internal Medicine

## 2018-04-09 DIAGNOSIS — Z79899 Other long term (current) drug therapy: Secondary | ICD-10-CM | POA: Insufficient documentation

## 2018-04-09 DIAGNOSIS — I10 Essential (primary) hypertension: Secondary | ICD-10-CM | POA: Insufficient documentation

## 2018-04-09 DIAGNOSIS — E876 Hypokalemia: Secondary | ICD-10-CM | POA: Insufficient documentation

## 2018-04-09 DIAGNOSIS — R918 Other nonspecific abnormal finding of lung field: Secondary | ICD-10-CM | POA: Diagnosis not present

## 2018-04-09 DIAGNOSIS — C3491 Malignant neoplasm of unspecified part of right bronchus or lung: Secondary | ICD-10-CM

## 2018-04-09 DIAGNOSIS — I4510 Unspecified right bundle-branch block: Secondary | ICD-10-CM | POA: Diagnosis not present

## 2018-04-09 DIAGNOSIS — I6523 Occlusion and stenosis of bilateral carotid arteries: Secondary | ICD-10-CM | POA: Diagnosis not present

## 2018-04-09 DIAGNOSIS — Z8673 Personal history of transient ischemic attack (TIA), and cerebral infarction without residual deficits: Secondary | ICD-10-CM | POA: Diagnosis not present

## 2018-04-09 DIAGNOSIS — D72829 Elevated white blood cell count, unspecified: Secondary | ICD-10-CM | POA: Diagnosis not present

## 2018-04-09 DIAGNOSIS — R0689 Other abnormalities of breathing: Secondary | ICD-10-CM | POA: Diagnosis not present

## 2018-04-09 DIAGNOSIS — F17210 Nicotine dependence, cigarettes, uncomplicated: Secondary | ICD-10-CM | POA: Insufficient documentation

## 2018-04-09 DIAGNOSIS — G459 Transient cerebral ischemic attack, unspecified: Secondary | ICD-10-CM | POA: Diagnosis not present

## 2018-04-09 DIAGNOSIS — E119 Type 2 diabetes mellitus without complications: Secondary | ICD-10-CM | POA: Insufficient documentation

## 2018-04-09 DIAGNOSIS — Z85118 Personal history of other malignant neoplasm of bronchus and lung: Secondary | ICD-10-CM | POA: Diagnosis not present

## 2018-04-09 DIAGNOSIS — R531 Weakness: Secondary | ICD-10-CM | POA: Diagnosis not present

## 2018-04-09 DIAGNOSIS — Z7901 Long term (current) use of anticoagulants: Secondary | ICD-10-CM | POA: Diagnosis not present

## 2018-04-09 DIAGNOSIS — C3490 Malignant neoplasm of unspecified part of unspecified bronchus or lung: Secondary | ICD-10-CM | POA: Diagnosis present

## 2018-04-09 DIAGNOSIS — F17200 Nicotine dependence, unspecified, uncomplicated: Secondary | ICD-10-CM

## 2018-04-09 DIAGNOSIS — R29810 Facial weakness: Secondary | ICD-10-CM | POA: Diagnosis not present

## 2018-04-09 LAB — COMPREHENSIVE METABOLIC PANEL
ALT: 10 U/L (ref 0–44)
AST: 14 U/L — ABNORMAL LOW (ref 15–41)
Albumin: 3.1 g/dL — ABNORMAL LOW (ref 3.5–5.0)
Alkaline Phosphatase: 66 U/L (ref 38–126)
Anion gap: 10 (ref 5–15)
BILIRUBIN TOTAL: 0.4 mg/dL (ref 0.3–1.2)
BUN: 8 mg/dL (ref 8–23)
CO2: 24 mmol/L (ref 22–32)
CREATININE: 0.95 mg/dL (ref 0.44–1.00)
Calcium: 9.1 mg/dL (ref 8.9–10.3)
Chloride: 104 mmol/L (ref 98–111)
GFR, EST NON AFRICAN AMERICAN: 56 mL/min — AB (ref >60)
Glucose, Bld: 201 mg/dL — ABNORMAL HIGH (ref 70–99)
POTASSIUM: 3.3 mmol/L — AB (ref 3.5–5.1)
Sodium: 138 mmol/L (ref 135–145)
TOTAL PROTEIN: 6.4 g/dL — AB (ref 6.5–8.1)

## 2018-04-09 LAB — DIFFERENTIAL
BASOS ABS: 0 10*3/uL (ref 0.0–0.1)
BASOS PCT: 0 %
EOS ABS: 1.1 10*3/uL — AB (ref 0.0–0.7)
Eosinophils Relative: 7 %
LYMPHS ABS: 1.8 10*3/uL (ref 0.7–4.0)
Lymphocytes Relative: 13 %
MONO ABS: 0.9 10*3/uL (ref 0.1–1.0)
MONOS PCT: 7 %
Neutro Abs: 10.5 10*3/uL — ABNORMAL HIGH (ref 1.7–7.7)
Neutrophils Relative %: 73 %

## 2018-04-09 LAB — PROTIME-INR
INR: 1.01
Prothrombin Time: 13.2 seconds (ref 11.4–15.2)

## 2018-04-09 LAB — CBC
HCT: 30.2 % — ABNORMAL LOW (ref 36.0–46.0)
HEMOGLOBIN: 9.7 g/dL — AB (ref 12.0–15.0)
MCH: 29 pg (ref 26.0–34.0)
MCHC: 32.1 g/dL (ref 30.0–36.0)
MCV: 90.4 fL (ref 78.0–100.0)
Platelets: 698 10*3/uL — ABNORMAL HIGH (ref 150–400)
RBC: 3.34 MIL/uL — ABNORMAL LOW (ref 3.87–5.11)
RDW: 16.1 % — AB (ref 11.5–15.5)
WBC: 14.4 10*3/uL — ABNORMAL HIGH (ref 4.0–10.5)

## 2018-04-09 LAB — APTT: aPTT: 30 seconds (ref 24–36)

## 2018-04-09 LAB — TROPONIN I

## 2018-04-09 LAB — ETHANOL: Alcohol, Ethyl (B): <10 mg/dL (ref <10)

## 2018-04-09 MED ORDER — PNEUMOCOCCAL VAC POLYVALENT 25 MCG/0.5ML IJ INJ: 0.5000 mL | INJECTION | INTRAMUSCULAR | Status: DC

## 2018-04-09 MED ORDER — INFLUENZA VAC SPLIT HIGH-DOSE 0.5 ML IM SUSY: 0.5000 mL | PREFILLED_SYRINGE | INTRAMUSCULAR | Status: DC

## 2018-04-09 MED ORDER — IOPAMIDOL (ISOVUE-370) INJECTION 76%
100.0000 mL | Freq: Once | INTRAVENOUS | Status: AC | PRN
  Administered 2018-04-09: 75 mL via INTRAVENOUS

## 2018-04-09 MED ORDER — ASPIRIN 81 MG PO CHEW
81.0000 mg | CHEWABLE_TABLET | Freq: Once | ORAL | Status: AC
  Administered 2018-04-09: 81 mg via ORAL
  Filled 2018-04-09: qty 1

## 2018-04-09 MED ORDER — POTASSIUM CHLORIDE CRYS ER 20 MEQ PO TBCR
40.0000 meq | EXTENDED_RELEASE_TABLET | Freq: Once | ORAL | Status: AC
  Administered 2018-04-09: 40 meq via ORAL
  Filled 2018-04-09: qty 2

## 2018-04-09 NOTE — ED Notes (Signed)
ED Provider at bedside. 

## 2018-04-09 NOTE — ED Notes (Signed)
Family at bedside. 

## 2018-04-09 NOTE — ED Notes (Signed)
Dr Thurnell Garbe aware of pt

## 2018-04-09 NOTE — ED Triage Notes (Signed)
Pt called ems due to left facial droop and left arm numbness. Hx of TIA. No sx's when ems arrived. bg 245. Pt arrived to ED a/o. No neuro sxs noted.

## 2018-04-09 NOTE — ED Provider Notes (Signed)
[facility] EMERGENCY DEPARTMENT Provider Note   CSN: [id number] Arrival date & time: 04/09/18  1531     History   Chief Complaint Chief Complaint  Patient presents with  . Transient Ischemic Attack    HPI [name] is a 77 y.o. female.  HPI  Pt was seen at 1615. Per pt and her family, c/o sudden onset and resolution of one episode of left sided facial droop, left eye blurry vision, left hand/forearm numbness that occurred PTA. Pt's symptoms lasted approximately 30 minutes before resolving when EMS arrived on scene. CBG 245 for EMS. Pt states she "feels better now." Pt was evaluated in the ED 10 days ago for her symptoms and was d/c because she was already on optimal therapy. Denies eye pain, no CP/palpitations, no cough/SOB, no abd pain, no N/V/D, no neck or back pain, no fevers, no rash, no injury.    Past Medical History:  Diagnosis Date  . Carotid artery occlusion   . Clostridium difficile infection 06/2013  . Diabetes mellitus    takes Janumet daily  . Dyslipidemia    takes Crestor daily  . Gallstones   . GERD (gastroesophageal reflux disease)   . Heart murmur    hx of  . History of radiation therapy 06/11/17-06/21/17   right lung 50 Gy in 5 fractions  . Hypertension    takes Prinzide and Verapamil daily  . Impaired speech    from stroke  . Neuropathy, diabetic ([place])   . Pneumonia   . PONV (postoperative nausea and vomiting)   . Smoker   . Stroke ([place]) 06/25/13  . Vertigo    but doesn't take any meds    Patient Active Problem List   Diagnosis Date Noted  . Malnutrition of moderate degree 03/01/2018  . AKI (acute kidney injury) ([place]) 02/27/2018  . Malignant neoplasm of lung ([place]) 07/04/2017  . Non-small cell lung cancer ([place]) 05/28/2017  . Pneumothorax after biopsy 05/23/2017  . Migraine equivalent 04/15/2017  . Smoker 04/15/2017  . Lung mass 02/16/2017  . Transient cerebral ischemia 02/15/2017  . Hyperlipidemia associated with type 2 diabetes  mellitus ([place]) 03/07/2016  . Adenomatous colon polyp s/p laparoscopic right colectomy 09/02/15 09/02/2015  . Hemispheric carotid artery syndrome 04/07/2015  . Cataract associated with type 2 diabetes mellitus ([place]) 03/08/2015  . S/P recent Left carotid endarterectomy 07/14/2013  . History of CVA (cerebrovascular accident) 06/27/2013  . Hypertension associated with diabetes ([place]) 01/04/2011  . DM type 2 with diabetic dyslipidemia ([place]) 01/04/2011  . Light smoker 01/04/2011    Past Surgical History:  Procedure Laterality Date  . cataract removed Right   . CHOLECYSTECTOMY    . ENDARTERECTOMY Left 07/14/2013   Procedure: ENDARTERECTOMY CAROTID-LEFT;  Surgeon: Elam Dutch, MD;  Location: [facility];  Service: Vascular;  Laterality: Left;  . IR PERC PLEURAL DRAIN W/INDWELL CATH W/IMG GUIDE  05/23/2017  . KNEE SURGERY Left 31yr ago  . LAPAROSCOPIC PARTIAL COLECTOMY N/A 09/02/2015   Procedure: LAPAROSCOPIC PARTIAL RIGHT COLECTOMY;  Surgeon: ALeighton Ruff MD;  Location: [facility];  Service: General;  Laterality: N/A;  . PATCH ANGIOPLASTY Left 07/14/2013   Procedure: LEFT CAROTID ARTERY PATCH ANGIOPLASTY;  Surgeon: CElam Dutch MD;  [facility]  Service: Vascular;  Laterality: Left;     OB History   None      Home Medications    Prior to Admission medications   Medication Sig Start Date End Date Taking? Authorizing Provider  acetaminophen (TYLENOL) 325 MG  tablet Take 325 mg by mouth every 6 (six) hours as needed for mild pain.    Yes [provider]  clopidogrel (PLAVIX) 75 MG tablet TAKE 1 TABLET BY MOUTH  DAILY Patient taking differently: Take 75 mg by mouth daily.  12/12/17  Yes [name], MD  megestrol (MEGACE) 400 MG/10ML suspension Take 10 mLs (400 mg total) by mouth 2 (two) times daily. Patient taking differently: Take 400 mg by mouth daily as needed (to increase appetite).  03/02/18  Yes Hosie Poisson, MD  Multiple Vitamin (MULTIVITAMIN WITH MINERALS) TABS  tablet Take 1 tablet by mouth daily. 03/03/18  Yes Hosie Poisson, MD  rosuvastatin (CRESTOR) 20 MG tablet Take 1 tablet (20 mg total) by mouth daily. Patient taking differently: Take 20 mg by mouth at bedtime.  02/25/18  Yes [name], MD  sitaGLIPtin-metformin (JANUMET) 50-1000 MG tablet Take 1 tablet by mouth 2 (two) times daily. Patient taking differently: Take 1 tablet by mouth 2 (two) times daily before a meal.  11/13/17  Yes [name], MD  Blood Glucose Monitoring Suppl (ONE TOUCH ULTRA 2) w/Device KIT 1 Device by Does not apply route 2 (two) times daily. 08/21/17   [name], MD  feeding supplement, GLUCERNA SHAKE, (GLUCERNA SHAKE) LIQD Take 237 mLs by mouth 3 (three) times daily between meals. Patient not taking: Reported on 03/07/2018 03/02/18   Hosie Poisson, MD  glucose blood (ONE TOUCH ULTRA TEST) test strip Use as instructed 02/10/18   [name], MD  guaiFENesin (MUCINEX) 600 MG 12 hr tablet Take 1 tablet (600 mg total) by mouth 2 (two) times daily. Patient not taking: Reported on 03/30/2018 03/02/18   Hosie Poisson, MD  Lancets [facility] ULTRASOFT) lancets Use as instructed 08/21/17   [name], MD  magnesium oxide (MAG-OX) 400 (241.3 Mg) MG tablet Take 1 tablet (400 mg total) by mouth 2 (two) times daily. Patient not taking: Reported on 03/30/2018 03/02/18   Hosie Poisson, MD  potassium & sodium phosphates (PHOS-NAK) 280-160-250 MG PACK Take 1 packet by mouth 4 (four) times daily -  with meals and at bedtime. Patient not taking: Reported on 03/10/2018 03/02/18   Hosie Poisson, MD    Family History Family History  Problem Relation Age of Onset  . Leukemia Mother   . Diabetes Mother   . Cancer Father        esophageal ca  . Heart disease Father   . Diabetes Maternal Aunt   . Diabetes Maternal Uncle   . Diabetes Brother   . Diabetes Sister   . Heart attack Brother        x 2    Social History Social History   Tobacco Use  . Smoking status: Current Every  Day Smoker    Packs/day: 0.50    Years: 60.00    Pack years: 30.00    Types: Cigarettes  . Smokeless tobacco: Never Used  Substance Use Topics  . Alcohol use: No    Alcohol/week: 0.0 standard drinks  . Drug use: No     Allergies   Codeine; Lipitor [atorvastatin]; and Phenergan [promethazine hcl]   Review of Systems Review of Systems ROS: Statement: All systems negative except as marked or noted in the HPI; Constitutional: Negative for fever and chills. ; ; Eyes: Negative for eye pain, redness and discharge. ; ; ENMT: Negative for ear pain, hoarseness, nasal congestion, sinus pressure and sore throat. ; ; Cardiovascular: Negative for chest pain, palpitations, diaphoresis,  dyspnea and peripheral edema. ; ; Respiratory: Negative for cough, wheezing and stridor. ; ; Gastrointestinal: Negative for nausea, vomiting, diarrhea, abdominal pain, blood in stool, hematemesis, jaundice and rectal bleeding. . ; ; Genitourinary: Negative for dysuria, flank pain and hematuria. ; ; Musculoskeletal: Negative for back pain and neck pain. Negative for swelling and trauma.; ; Skin: Negative for pruritus, rash, abrasions, blisters, bruising and skin lesion.; ; Neuro: +left eye blurry vision, left facial droop, left hand numbness. Negative for headache, lightheadedness and neck stiffness. Negative for weakness, altered level of consciousness, altered mental status, involuntary movement, seizure and syncope.       Physical Exam Updated Vital Signs BP (!) 149/65   Pulse 88   Temp 98.6 F (37 C)   Resp (!) 29   SpO2 97%   Physical Exam 1620: Physical examination:  Nursing notes reviewed; Vital signs and O2 SAT reviewed;  Constitutional: Well developed, Well nourished, Well hydrated, In no acute distress; Head:  Normocephalic, atraumatic; Eyes: EOMI, PERRL, No scleral icterus; ENMT: Mouth and pharynx normal, Mucous membranes moist; Neck: Supple, Full range of motion, No lymphadenopathy; Cardiovascular:  Regular rate and rhythm, No gallop; Respiratory: Breath sounds clear & equal bilaterally, No wheezes.  Speaking full sentences with ease, Normal respiratory effort/excursion; Chest: Nontender, Movement normal; Abdomen: Soft, Nontender, Nondistended, Normal bowel sounds; Genitourinary: No CVA tenderness; Extremities: Peripheral pulses normal, No tenderness, No edema, No calf edema or asymmetry.; Neuro: AA&Ox3, Major CN grossly intact. Speech clear.  No facial droop. Grips equal. Strength 5/5 equal bilat UE's and LE's.  DTR 2/4 equal bilat UE's and LE's.  No gross sensory deficits.  Normal cerebellar testing bilat UE's (finger-nose) and LE's (heel-shin)..; Skin: Color normal, Warm, Dry.   ED Treatments / Results  Labs (all labs ordered are listed, but only abnormal results are displayed)   EKG EKG Interpretation  Date/Time:  Wednesday April 09 2018 15:59:55 EDT Ventricular Rate:  88 PR Interval:    QRS Duration: 119 QT Interval:  397 QTC Calculation: 481 R Axis:   -46 Text Interpretation:  Sinus rhythm Incomplete RBBB and LAFB Low voltage, precordial leads When compared with ECG of 02/27/2018 No significant change was found Confirmed by Francine Graven [phone]) on 04/09/2018 4:24:39 PM   Radiology   Procedures Procedures (including critical care time)  Medications Ordered in ED Medications - No data to display   Initial Impression / Assessment and Plan / ED Course  I have reviewed the triage vital signs and the nursing notes.  Pertinent labs & imaging results that were available during my care of the patient were reviewed by me and considered in my medical decision making (see chart for details).  MDM Reviewed: previous chart, nursing note and vitals Reviewed previous: labs, ECG, MRI and CT scan Interpretation: labs, ECG, x-ray, MRI and CT scan   Results for orders placed or performed during the hospital encounter of 04/09/18  Ethanol  Result Value Ref Range   Alcohol,  Ethyl (B) <10 <10 mg/dL  Protime-INR  Result Value Ref Range   Prothrombin Time 13.2 11.4 - 15.2 seconds   INR 1.01   APTT  Result Value Ref Range   aPTT 30 24 - 36 seconds  CBC  Result Value Ref Range   WBC 14.4 (H) 4.0 - 10.5 K/uL   RBC 3.34 (L) 3.87 - 5.11 MIL/uL   Hemoglobin 9.7 (L) 12.0 - 15.0 g/dL   HCT 30.2 (L) 36.0 - 46.0 %   MCV 90.4 78.0 -  100.0 fL   MCH 29.0 26.0 - 34.0 pg   MCHC 32.1 30.0 - 36.0 g/dL   RDW 16.1 (H) 11.5 - 15.5 %   Platelets 698 (H) 150 - 400 K/uL  Differential  Result Value Ref Range   Neutrophils Relative % 73 %   Neutro Abs 10.5 (H) 1.7 - 7.7 K/uL   Lymphocytes Relative 13 %   Lymphs Abs 1.8 0.7 - 4.0 K/uL   Monocytes Relative 7 %   Monocytes Absolute 0.9 0.1 - 1.0 K/uL   Eosinophils Relative 7 %   Eosinophils Absolute 1.1 (H) 0.0 - 0.7 K/uL   Basophils Relative 0 %   Basophils Absolute 0.0 0.0 - 0.1 K/uL  Comprehensive metabolic panel  Result Value Ref Range   Sodium 138 135 - 145 mmol/L   Potassium 3.3 (L) 3.5 - 5.1 mmol/L   Chloride 104 98 - 111 mmol/L   CO2 24 22 - 32 mmol/L   Glucose, Bld 201 (H) 70 - 99 mg/dL   BUN 8 8 - 23 mg/dL   Creatinine, Ser 0.95 0.44 - 1.00 mg/dL   Calcium 9.1 8.9 - 10.3 mg/dL   Total Protein 6.4 (L) 6.5 - 8.1 g/dL   Albumin 3.1 (L) 3.5 - 5.0 g/dL   AST 14 (L) 15 - 41 U/L   ALT 10 0 - 44 U/L   Alkaline Phosphatase 66 38 - 126 U/L   Total Bilirubin 0.4 0.3 - 1.2 mg/dL   GFR calc non Af Amer 56 (L) >60 mL/min   GFR calc Af Amer >60 >60 mL/min   Anion gap 10 5 - 15  Troponin I  Result Value Ref Range   Troponin I <0.03 <0.03 ng/mL   Ct Angio Head W Or Wo Contrast Result Date: 04/09/2018 CLINICAL DATA:  Left facial droop and left arm numbness. EXAM: CT ANGIOGRAPHY HEAD AND NECK TECHNIQUE: Multidetector CT imaging of the head and neck was performed using the standard protocol during bolus administration of intravenous contrast. Multiplanar CT image reconstructions and MIPs were obtained to evaluate the  vascular anatomy. Carotid stenosis measurements (when applicable) are obtained utilizing NASCET criteria, using the distal internal carotid diameter as the denominator. CONTRAST:  34m ISOVUE-370 IOPAMIDOL (ISOVUE-370) INJECTION 76% COMPARISON:  None. FINDINGS: CT HEAD FINDINGS Brain: There is no mass, hemorrhage or extra-axial collection. There is generalized atrophy without lobar predilection. There is an old left frontal lobe infarct. There is hypoattenuation of the periventricular white matter, most commonly indicating chronic ischemic microangiopathy. Skull: The visualized skull base, calvarium and extracranial soft tissues are normal. Sinuses/Orbits: No fluid levels or advanced mucosal thickening of the visualized paranasal sinuses. No mastoid or middle ear effusion. The orbits are normal. CTA NECK FINDINGS AORTIC ARCH: There is mild calcific atherosclerosis of the aortic arch. There is no aneurysm, dissection or hemodynamically significant stenosis of the visualized ascending aorta and aortic arch. Conventional 3 vessel aortic branching pattern. The visualized proximal subclavian arteries are widely patent. RIGHT CAROTID SYSTEM: --Common carotid artery: Widely patent origin without common carotid artery dissection or aneurysm. --Internal carotid artery: No dissection, occlusion or aneurysm. No hemodynamically significant stenosis. --External carotid artery: No acute abnormality. LEFT CAROTID SYSTEM: --Common carotid artery: Widely patent origin without common carotid artery dissection or aneurysm. --Internal carotid artery:There is irregular fibrofatty plaque within the distal left common carotid artery, extending into the proximal left internal carotid artery. There is no hemodynamically significant stenosis. --External carotid artery: No acute abnormality. VERTEBRAL ARTERIES: Codominant configuration. Both origins  are normal. No dissection, occlusion or flow-limiting stenosis to the vertebrobasilar  confluence. SKELETON: There is no bony spinal canal stenosis. No lytic or blastic lesion. OTHER NECK: Normal pharynx, larynx and major salivary glands. No cervical lymphadenopathy. Unremarkable thyroid gland. UPPER CHEST: Right upper lobe mass is incompletely visualized, but appears unchanged compared to the chest CT of 02/27/2018. Small right pleural effusion. CTA HEAD FINDINGS ANTERIOR CIRCULATION: --Intracranial internal carotid arteries: Normal. --Anterior cerebral arteries: Normal. Both A1 segments are present. Patent anterior communicating artery. --Middle cerebral arteries: Normal. --Posterior communicating arteries: Absent bilaterally. POSTERIOR CIRCULATION: --Basilar artery: Normal. --Posterior cerebral arteries: Normal. --Superior cerebellar arteries: Normal. --Inferior cerebellar arteries: Normal anterior and posterior inferior cerebellar arteries. VENOUS SINUSES: As permitted by contrast timing, patent. ANATOMIC VARIANTS: None DELAYED PHASE: No parenchymal contrast enhancement. Review of the MIP images confirms the above findings. IMPRESSION: 1. No emergent large vessel occlusion or hemodynamically significant intracranial stenosis. 2. Irregular fibrofatty plaque within the distal left common carotid artery and proximal left internal carotid artery, without hemodynamically significant stenosis. This appearance may be associated with an increased risk of plaque rupture. 3. Incompletely visualized right upper lobe mass. 4.  Aortic Atherosclerosis (ICD10-I70.0). Electronically Signed   By: Ulyses Jarred M.D.   On: 04/09/2018 19:55   Dg Chest 2 View Result Date: 04/09/2018 CLINICAL DATA:  Left facial droop, left hand numbness. EXAM: CHEST - 2 VIEW COMPARISON:  Radiographs of March 30, 2018. CT scan of February 27, 2018. FINDINGS: The heart size and mediastinal contours are within normal limits. No pneumothorax or pleural effusion is noted. Left lung is clear. Stable right upper lobe lung mass is noted  consistent with malignancy. The visualized skeletal structures are unremarkable. IMPRESSION: Stable right upper lobe lung mass is noted consistent with malignancy. No significant changes noted compared to prior exam. Electronically Signed   By: Marijo Conception, M.D.   On: 04/09/2018 16:53    Ct Angio Neck W Or Wo Contrast Result Date: 04/09/2018 CLINICAL DATA:  Left facial droop and left arm numbness. EXAM: CT ANGIOGRAPHY HEAD AND NECK TECHNIQUE: Multidetector CT imaging of the head and neck was performed using the standard protocol during bolus administration of intravenous contrast. Multiplanar CT image reconstructions and MIPs were obtained to evaluate the vascular anatomy. Carotid stenosis measurements (when applicable) are obtained utilizing NASCET criteria, using the distal internal carotid diameter as the denominator. CONTRAST:  35m ISOVUE-370 IOPAMIDOL (ISOVUE-370) INJECTION 76% COMPARISON:  None. FINDINGS: CT HEAD FINDINGS Brain: There is no mass, hemorrhage or extra-axial collection. There is generalized atrophy without lobar predilection. There is an old left frontal lobe infarct. There is hypoattenuation of the periventricular white matter, most commonly indicating chronic ischemic microangiopathy. Skull: The visualized skull base, calvarium and extracranial soft tissues are normal. Sinuses/Orbits: No fluid levels or advanced mucosal thickening of the visualized paranasal sinuses. No mastoid or middle ear effusion. The orbits are normal. CTA NECK FINDINGS AORTIC ARCH: There is mild calcific atherosclerosis of the aortic arch. There is no aneurysm, dissection or hemodynamically significant stenosis of the visualized ascending aorta and aortic arch. Conventional 3 vessel aortic branching pattern. The visualized proximal subclavian arteries are widely patent. RIGHT CAROTID SYSTEM: --Common carotid artery: Widely patent origin without common carotid artery dissection or aneurysm. --Internal carotid  artery: No dissection, occlusion or aneurysm. No hemodynamically significant stenosis. --External carotid artery: No acute abnormality. LEFT CAROTID SYSTEM: --Common carotid artery: Widely patent origin without common carotid artery dissection or aneurysm. --Internal carotid artery:There is irregular fibrofatty plaque  within the distal left common carotid artery, extending into the proximal left internal carotid artery. There is no hemodynamically significant stenosis. --External carotid artery: No acute abnormality. VERTEBRAL ARTERIES: Codominant configuration. Both origins are normal. No dissection, occlusion or flow-limiting stenosis to the vertebrobasilar confluence. SKELETON: There is no bony spinal canal stenosis. No lytic or blastic lesion. OTHER NECK: Normal pharynx, larynx and major salivary glands. No cervical lymphadenopathy. Unremarkable thyroid gland. UPPER CHEST: Right upper lobe mass is incompletely visualized, but appears unchanged compared to the chest CT of 02/27/2018. Small right pleural effusion. CTA HEAD FINDINGS ANTERIOR CIRCULATION: --Intracranial internal carotid arteries: Normal. --Anterior cerebral arteries: Normal. Both A1 segments are present. Patent anterior communicating artery. --Middle cerebral arteries: Normal. --Posterior communicating arteries: Absent bilaterally. POSTERIOR CIRCULATION: --Basilar artery: Normal. --Posterior cerebral arteries: Normal. --Superior cerebellar arteries: Normal. --Inferior cerebellar arteries: Normal anterior and posterior inferior cerebellar arteries. VENOUS SINUSES: As permitted by contrast timing, patent. ANATOMIC VARIANTS: None DELAYED PHASE: No parenchymal contrast enhancement. Review of the MIP images confirms the above findings. IMPRESSION: 1. No emergent large vessel occlusion or hemodynamically significant intracranial stenosis. 2. Irregular fibrofatty plaque within the distal left common carotid artery and proximal left internal carotid artery,  without hemodynamically significant stenosis. This appearance may be associated with an increased risk of plaque rupture. 3. Incompletely visualized right upper lobe mass. 4.  Aortic Atherosclerosis (ICD10-I70.0). Electronically Signed   By: Ulyses Jarred M.D.   On: 04/09/2018 19:55   Mr Brain Wo Contrast (neuro Protocol) Result Date: 04/09/2018 CLINICAL DATA:  Transient ischemic attack EXAM: MRI HEAD WITHOUT CONTRAST TECHNIQUE: Multiplanar, multiecho pulse sequences of the brain and surrounding structures were obtained without intravenous contrast. COMPARISON:  Brain MRI 03/30/2018 FINDINGS: BRAIN: There is no acute infarct, acute hemorrhage or mass effect. The midline structures are normal. There is an old left frontal infarct. Multifocal white matter hyperintensity, most commonly due to chronic ischemic microangiopathy. Generalized atrophy without lobar predilection. Susceptibility-sensitive sequences show no chronic microhemorrhage or superficial siderosis. VASCULAR: Major intracranial arterial and venous sinus flow voids are preserved. SKULL AND UPPER CERVICAL SPINE: The visualized skull base, calvarium, upper cervical spine and extracranial soft tissues are normal. SINUSES/ORBITS: No fluid levels or advanced mucosal thickening. No mastoid or middle ear effusion. The orbits are normal. IMPRESSION: 1. Atrophy and chronic small vessel disease without acute intracranial abnormality. 2. Old left frontal infarct. Electronically Signed   By: Ulyses Jarred M.D.   On: 04/09/2018 17:35      2005:  Not Code Stroke because pt arrived without symptoms. Workup without acute stroke.  T/C returned from [facility] Dr. Dyane Dustman, case discussed, including:  HPI, pertinent PM/SHx, VS/PE, dx testing, ED course and treatment: since this is pt's 2nd ED visit in 10 days, pt will need overnight admit to obtain updated echocardiogram, lipid profile and A1C levels, pt will also need to started on ASA 78m PO qdaily for the next  3 weeks and continue to take her plavix 712mPO qdaily.   2135:  Dx and testing, as well as d/w Neuro MD, d/w pt and family.  Questions answered.  Verb understanding, agreeable to admit. T/C returned from [facility] Dr. JoWynetta Emerycase discussed, including:  HPI, pertinent PM/SHx, VS/PE, dx testing, ED course and treatment:  Agreeable to admit.     Final Clinical Impressions(s) / ED Diagnoses   Final diagnoses:  None    ED Discharge Orders    None       McFrancine GravenDO 04/11/18 2103

## 2018-04-10 ENCOUNTER — Observation Stay (HOSPITAL_BASED_OUTPATIENT_CLINIC_OR_DEPARTMENT_OTHER): Payer: Medicare Other

## 2018-04-10 ENCOUNTER — Telehealth: Payer: Self-pay | Admitting: Physician Assistant

## 2018-04-10 ENCOUNTER — Other Ambulatory Visit: Payer: Self-pay

## 2018-04-10 DIAGNOSIS — G459 Transient cerebral ischemic attack, unspecified: Secondary | ICD-10-CM | POA: Diagnosis not present

## 2018-04-10 DIAGNOSIS — I69952 Hemiplegia and hemiparesis following unspecified cerebrovascular disease affecting left dominant side: Secondary | ICD-10-CM | POA: Diagnosis not present

## 2018-04-10 DIAGNOSIS — E876 Hypokalemia: Secondary | ICD-10-CM

## 2018-04-10 DIAGNOSIS — R079 Chest pain, unspecified: Secondary | ICD-10-CM | POA: Diagnosis not present

## 2018-04-10 LAB — URINALYSIS, ROUTINE W REFLEX MICROSCOPIC
Bacteria, UA: NONE SEEN
Bilirubin Urine: NEGATIVE
Glucose, UA: >=500 mg/dL — AB
Hgb urine dipstick: NEGATIVE
KETONES UR: NEGATIVE mg/dL
LEUKOCYTES UA: NEGATIVE
Nitrite: NEGATIVE
PH: 6 (ref 5.0–8.0)
Protein, ur: NEGATIVE mg/dL
Specific Gravity, Urine: 1.029 (ref 1.005–1.030)

## 2018-04-10 LAB — RAPID URINE DRUG SCREEN, HOSP PERFORMED
Amphetamines: NOT DETECTED
Barbiturates: NOT DETECTED
Benzodiazepines: NOT DETECTED
COCAINE: NOT DETECTED
OPIATES: NOT DETECTED
TETRAHYDROCANNABINOL: NOT DETECTED

## 2018-04-10 LAB — GLUCOSE, CAPILLARY
GLUCOSE-CAPILLARY: 170 mg/dL — AB (ref 70–99)
Glucose-Capillary: 154 mg/dL — ABNORMAL HIGH (ref 70–99)
Glucose-Capillary: 182 mg/dL — ABNORMAL HIGH (ref 70–99)

## 2018-04-10 LAB — ECHOCARDIOGRAM COMPLETE
HEIGHTINCHES: 66 in
WEIGHTICAEL: 2172.85 [oz_av]

## 2018-04-10 LAB — LIPID PANEL
Cholesterol: 88 mg/dL (ref 0–200)
HDL: 28 mg/dL — AB (ref >40)
LDL CALC: 37 mg/dL (ref 0–99)
Total CHOL/HDL Ratio: 3.1 RATIO
Triglycerides: 117 mg/dL (ref <150)
VLDL: 23 mg/dL (ref 0–40)

## 2018-04-10 LAB — HEMOGLOBIN A1C
HEMOGLOBIN A1C: 9.4 % — AB (ref 4.8–5.6)
Mean Plasma Glucose: 223.08 mg/dL

## 2018-04-10 MED ORDER — STROKE: EARLY STAGES OF RECOVERY BOOK
Freq: Once | Status: AC
  Administered 2018-04-10
  Filled 2018-04-10: qty 1

## 2018-04-10 MED ORDER — CLOPIDOGREL BISULFATE 75 MG PO TABS
75.0000 mg | ORAL_TABLET | Freq: Every day | ORAL | Status: DC
  Administered 2018-04-10: 75 mg via ORAL
  Filled 2018-04-10: qty 1

## 2018-04-10 MED ORDER — ASPIRIN EC 325 MG PO TBEC: 325.0000 mg | DELAYED_RELEASE_TABLET | Freq: Every day | ORAL | 3 refills | Status: DC

## 2018-04-10 MED ORDER — INSULIN ASPART 100 UNIT/ML ~~LOC~~ SOLN
0.0000 [IU] | Freq: Three times a day (TID) | SUBCUTANEOUS | Status: DC
  Administered 2018-04-10 (×2): 2 [IU] via SUBCUTANEOUS

## 2018-04-10 MED ORDER — SENNOSIDES-DOCUSATE SODIUM 8.6-50 MG PO TABS: 1.0000 | ORAL_TABLET | Freq: Every evening | ORAL | Status: DC | PRN

## 2018-04-10 MED ORDER — MENTHOL 3 MG MT LOZG
1.0000 | LOZENGE | OROMUCOSAL | Status: DC | PRN
  Administered 2018-04-10: 3 mg via ORAL
  Filled 2018-04-10: qty 9

## 2018-04-10 MED ORDER — ADULT MULTIVITAMIN W/MINERALS CH
1.0000 | ORAL_TABLET | Freq: Every day | ORAL | Status: DC
  Administered 2018-04-10: 1 via ORAL
  Filled 2018-04-10: qty 1

## 2018-04-10 MED ORDER — ROSUVASTATIN CALCIUM 20 MG PO TABS: 20.0000 mg | ORAL_TABLET | Freq: Every day | ORAL | Status: DC

## 2018-04-10 MED ORDER — GUAIFENESIN-DM 100-10 MG/5ML PO SYRP: 5.0000 mL | ORAL_SOLUTION | ORAL | Status: DC | PRN

## 2018-04-10 MED ORDER — ACETAMINOPHEN 160 MG/5ML PO SOLN: 650.0000 mg | ORAL | Status: DC | PRN

## 2018-04-10 MED ORDER — ACETAMINOPHEN 650 MG RE SUPP: 650.0000 mg | RECTAL | Status: DC | PRN

## 2018-04-10 MED ORDER — ACETAMINOPHEN 325 MG PO TABS: 325.0000 mg | ORAL_TABLET | Freq: Four times a day (QID) | ORAL | Status: DC | PRN

## 2018-04-10 MED ORDER — ACETAMINOPHEN 325 MG PO TABS: 650.0000 mg | ORAL_TABLET | ORAL | Status: DC | PRN

## 2018-04-10 NOTE — H&P (Signed)
History and Physical    [name] ZOX:[id number] DOB: 03/06/1941 DOA: 04/09/2018  PCP: [name], MD (Confirm with patient/family/NH records and if not entered, this has to be entered at [facility] point of entry) Patient coming from: Home  I have personally briefly reviewed patient's old medical records in [place]  Chief Complaint: Left-sided facial droop and left-sided arm numbness  HPI: [name] is a 77 y.o. female with medical history significant of lung cancer status post radiation last done April 2019,  tobacco abuse, TIA recently ,who presents with left-sided facial numbness droop left arm numbness.  Symptoms resolved after 30 minutes or so.  No further symptoms here in the ED.  Patient was seen for TIA about 10 days ago in the ED.  She was discharged home and  was supposed to follow-up but she has not.  Patient had worsening droop this episode than that episode.  Patient is on Plavix as she does have a remote history of stroke.  Patient has right upper lobe lung cancer and last had radiation in April of this year .she is to follow-up in October.  She continues to smoke cigarettes  ED Course: Negative MRI of the brain no significant stenosis on CT angios head and neck.  ED physician consulted tele-neurology who recommended admission for echo A1c and lipids to complete her stroke work-up.  And adding aspirin to her Plavix.  Patient received aspirin in the ED.  Review of Systems: +For chronic cough ,no shortness of breath no chest pain. All others reviewed with patient  and are  negative unless otherwise stated   Past Medical History:  Diagnosis Date  . Carotid artery occlusion   . Clostridium difficile infection 06/2013  . Diabetes mellitus    takes Janumet daily  . Dyslipidemia    takes Crestor daily  . Gallstones   . GERD (gastroesophageal reflux disease)   . Heart murmur    hx of  . History of radiation therapy 06/11/17-06/21/17   right lung 50 Gy in 5  fractions  . Hypertension    takes Prinzide and Verapamil daily  . Impaired speech    from stroke  . Neuropathy, diabetic ([place])   . Pneumonia   . PONV (postoperative nausea and vomiting)   . Smoker   . Stroke ([place]) 06/25/13  . Vertigo    but doesn't take any meds    Past Surgical History:  Procedure Laterality Date  . cataract removed Right   . CHOLECYSTECTOMY    . ENDARTERECTOMY Left 07/14/2013   Procedure: ENDARTERECTOMY CAROTID-LEFT;  Surgeon: Elam Dutch, MD;  Location: [place];  Service: Vascular;  Laterality: Left;  . IR PERC PLEURAL DRAIN W/INDWELL CATH W/IMG GUIDE  05/23/2017  . KNEE SURGERY Left 64yr ago  . LAPAROSCOPIC PARTIAL COLECTOMY N/A 09/02/2015   Procedure: LAPAROSCOPIC PARTIAL RIGHT COLECTOMY;  Surgeon: ALeighton Ruff MD;  Location: [facility];  Service: General;  Laterality: N/A;  . PATCH ANGIOPLASTY Left 07/14/2013   Procedure: LEFT CAROTID ARTERY PATCH ANGIOPLASTY;  Surgeon: CElam Dutch MD;  [place]  Service: Vascular;  Laterality: Left;     reports that she has been smoking cigarettes. She has a 30.00 pack-year smoking history. She has never used smokeless tobacco. She reports that she does not drink alcohol or use drugs.  Allergies  Allergen Reactions  . Codeine Nausea And Vomiting  . Lipitor [Atorvastatin] Nausea And Vomiting  . Phenergan [Promethazine Hcl] Other (See Comments)  Confusion, hallucinations, severe agitation    Family History  Problem Relation Age of Onset  . Leukemia Mother   . Diabetes Mother   . Cancer Father        esophageal ca  . Heart disease Father   . Diabetes Maternal Aunt   . Diabetes Maternal Uncle   . Diabetes Brother   . Diabetes Sister   . Heart attack Brother        x 2    Prior to Admission medications   Medication Sig Start Date End Date Taking? Authorizing Provider  acetaminophen (TYLENOL) 325 MG tablet Take 325 mg by mouth every 6 (six) hours as needed for mild pain.    Yes [provider]  clopidogrel (PLAVIX) 75 MG tablet TAKE 1 TABLET BY MOUTH  DAILY Patient taking differently: Take 75 mg by mouth daily.  12/12/17  Yes [name], MD  megestrol (MEGACE) 400 MG/10ML suspension Take 10 mLs (400 mg total) by mouth 2 (two) times daily. Patient taking differently: Take 400 mg by mouth daily as needed (to increase appetite).  03/02/18  Yes Hosie Poisson, MD  Multiple Vitamin (MULTIVITAMIN WITH MINERALS) TABS tablet Take 1 tablet by mouth daily. 03/03/18  Yes Hosie Poisson, MD  rosuvastatin (CRESTOR) 20 MG tablet Take 1 tablet (20 mg total) by mouth daily. Patient taking differently: Take 20 mg by mouth at bedtime.  02/25/18  Yes [name], MD  sitaGLIPtin-metformin (JANUMET) 50-1000 MG tablet Take 1 tablet by mouth 2 (two) times daily. Patient taking differently: Take 1 tablet by mouth 2 (two) times daily before a meal.  11/13/17  Yes [name], MD  Blood Glucose Monitoring Suppl (ONE TOUCH ULTRA 2) w/Device KIT 1 Device by Does not apply route 2 (two) times daily. 08/21/17   [name], MD  feeding supplement, GLUCERNA SHAKE, (GLUCERNA SHAKE) LIQD Take 237 mLs by mouth 3 (three) times daily between meals. Patient not taking: Reported on 03/07/2018 03/02/18   Hosie Poisson, MD  glucose blood (ONE TOUCH ULTRA TEST) test strip Use as instructed 02/10/18   [name], MD  guaiFENesin (MUCINEX) 600 MG 12 hr tablet Take 1 tablet (600 mg total) by mouth 2 (two) times daily. Patient not taking: Reported on 03/30/2018 03/02/18   Hosie Poisson, MD  Lancets [facility] ULTRASOFT) lancets Use as instructed 08/21/17   [name], MD  magnesium oxide (MAG-OX) 400 (241.3 Mg) MG tablet Take 1 tablet (400 mg total) by mouth 2 (two) times daily. Patient not taking: Reported on 03/30/2018 03/02/18   Hosie Poisson, MD  potassium & sodium phosphates (PHOS-NAK) 280-160-250 MG PACK Take 1 packet by mouth 4 (four) times daily -  with meals and at bedtime. Patient not taking:  Reported on 03/10/2018 03/02/18   Hosie Poisson, MD    Physical Exam: Vitals:   04/09/18 2200 04/09/18 2230 04/09/18 2337 04/10/18 0130  BP: 140/66 (!) 145/56 (!) 162/71 105/66  Pulse: 79 80 83 80  Resp: (!) 21 (!) '23 19 18  ' Temp:   98.3 F (36.8 C) 98.2 F (36.8 C)  TempSrc:   Oral Oral  SpO2: 97% 92% 100% 100%  Weight:   61.6 kg   Height:   '5\' 6"'  (1.676 m)     Constitutional: NAD, calm, comfortable Vitals:   04/09/18 2200 04/09/18 2230 04/09/18 2337 04/10/18 0130  BP: 140/66 (!) 145/56 (!) 162/71 105/66  Pulse: 79 80 83 80  Resp: (!) 21 (!) 23 19  18  Temp:   98.3 F (36.8 C) 98.2 F (36.8 C)  TempSrc:   Oral Oral  SpO2: 97% 92% 100% 100%  Weight:   61.6 kg   Height:   '5\' 6"'  (1.676 m)    Eyes: PERRL, lids and conjunctivae normal ENMT: Mucous membranes are moist. Posterior pharynx clear of any exudate or lesions.Normal dentition.  Neck: normal, supple, no masses, no thyromegaly Respiratory: deCreased breath sounds throughout, clear to auscultation bilaterally, no wheezing, no crackles. Normal respiratory effort. No accessory muscle use.  Cardiovascular: Regular rate and rhythm, no murmurs / rubs / gallops. No extremity edema. 1+ pedal pulses. No carotid bruits.  Abdomen: no tenderness, no masses palpated. No hepatosplenomegaly. Bowel sounds positive.  Musculoskeletal: no clubbing / cyanosis. No joint deformity upper and lower extremities. Good ROM, no contractures. Normal muscle tone.  Skin: no rashes, lesions, ulcers. No induration Neurologic: CN 2-12 grossly intact. strength 5/5 in all 4.  Psychiatric: poor  judgment and  Good insight. Alert and oriented x 3. Normal mood.    Labs on Admission: I have personally reviewed following labs and imaging studies  CBC: Recent Labs  Lab 04/09/18 1734  WBC 14.4*  NEUTROABS 10.5*  HGB 9.7*  HCT 30.2*  MCV 90.4  PLT 063*   Basic Metabolic Panel: Recent Labs  Lab 04/09/18 1734  NA 138  K 3.3*  CL 104  CO2 24    GLUCOSE 201*  BUN 8  CREATININE 0.95  CALCIUM 9.1   GFR: Estimated Creatinine Clearance: 46.4 mL/min (by C-G formula based on SCr of 0.95 mg/dL). Liver Function Tests: Recent Labs  Lab 04/09/18 1734  AST 14*  ALT 10  ALKPHOS 66  BILITOT 0.4  PROT 6.4*  ALBUMIN 3.1*   No results for input(s): LIPASE, AMYLASE in the last 168 hours. No results for input(s): AMMONIA in the last 168 hours. Coagulation Profile: Recent Labs  Lab 04/09/18 1734  INR 1.01   Cardiac Enzymes: Recent Labs  Lab 04/09/18 1734  TROPONINI <0.03   BNP (last 3 results) No results for input(s): PROBNP in the last 8760 hours. HbA1C: No results for input(s): HGBA1C in the last 72 hours. CBG: No results for input(s): GLUCAP in the last 168 hours. Lipid Profile: No results for input(s): CHOL, HDL, LDLCALC, TRIG, CHOLHDL, LDLDIRECT in the last 72 hours. Thyroid Function Tests: No results for input(s): TSH, T4TOTAL, FREET4, T3FREE, THYROIDAB in the last 72 hours. Anemia Panel: No results for input(s): VITAMINB12, FOLATE, FERRITIN, TIBC, IRON, RETICCTPCT in the last 72 hours. Urine analysis:    Component Value Date/Time   COLORURINE YELLOW 03/30/2018 2044   APPEARANCEUR HAZY (A) 03/30/2018 2044   LABSPEC 1.024 03/30/2018 2044   LABSPEC 1.020 07/04/2017 1051   PHURINE 5.0 03/30/2018 2044   GLUCOSEU >=500 (A) 03/30/2018 2044   HGBUR NEGATIVE 03/30/2018 2044   BILIRUBINUR NEGATIVE 03/30/2018 2044   BILIRUBINUR negative 07/04/2017 1051   KETONESUR 5 (A) 03/30/2018 2044   PROTEINUR NEGATIVE 03/30/2018 2044   UROBILINOGEN 0.2 07/25/2013 0952   NITRITE NEGATIVE 03/30/2018 2044   LEUKOCYTESUR TRACE (A) 03/30/2018 2044    Radiological Exams on Admission: Ct Angio Head W Or Wo Contrast  Result Date: 04/09/2018 CLINICAL DATA:  Left facial droop and left arm numbness. EXAM: CT ANGIOGRAPHY HEAD AND NECK TECHNIQUE: Multidetector CT imaging of the head and neck was performed using the standard protocol  during bolus administration of intravenous contrast. Multiplanar CT image reconstructions and MIPs were obtained to evaluate the vascular  anatomy. Carotid stenosis measurements (when applicable) are obtained utilizing NASCET criteria, using the distal internal carotid diameter as the denominator. CONTRAST:  23m ISOVUE-370 IOPAMIDOL (ISOVUE-370) INJECTION 76% COMPARISON:  None. FINDINGS: CT HEAD FINDINGS Brain: There is no mass, hemorrhage or extra-axial collection. There is generalized atrophy without lobar predilection. There is an old left frontal lobe infarct. There is hypoattenuation of the periventricular white matter, most commonly indicating chronic ischemic microangiopathy. Skull: The visualized skull base, calvarium and extracranial soft tissues are normal. Sinuses/Orbits: No fluid levels or advanced mucosal thickening of the visualized paranasal sinuses. No mastoid or middle ear effusion. The orbits are normal. CTA NECK FINDINGS AORTIC ARCH: There is mild calcific atherosclerosis of the aortic arch. There is no aneurysm, dissection or hemodynamically significant stenosis of the visualized ascending aorta and aortic arch. Conventional 3 vessel aortic branching pattern. The visualized proximal subclavian arteries are widely patent. RIGHT CAROTID SYSTEM: --Common carotid artery: Widely patent origin without common carotid artery dissection or aneurysm. --Internal carotid artery: No dissection, occlusion or aneurysm. No hemodynamically significant stenosis. --External carotid artery: No acute abnormality. LEFT CAROTID SYSTEM: --Common carotid artery: Widely patent origin without common carotid artery dissection or aneurysm. --Internal carotid artery:There is irregular fibrofatty plaque within the distal left common carotid artery, extending into the proximal left internal carotid artery. There is no hemodynamically significant stenosis. --External carotid artery: No acute abnormality. VERTEBRAL ARTERIES:  Codominant configuration. Both origins are normal. No dissection, occlusion or flow-limiting stenosis to the vertebrobasilar confluence. SKELETON: There is no bony spinal canal stenosis. No lytic or blastic lesion. OTHER NECK: Normal pharynx, larynx and major salivary glands. No cervical lymphadenopathy. Unremarkable thyroid gland. UPPER CHEST: Right upper lobe mass is incompletely visualized, but appears unchanged compared to the chest CT of 02/27/2018. Small right pleural effusion. CTA HEAD FINDINGS ANTERIOR CIRCULATION: --Intracranial internal carotid arteries: Normal. --Anterior cerebral arteries: Normal. Both A1 segments are present. Patent anterior communicating artery. --Middle cerebral arteries: Normal. --Posterior communicating arteries: Absent bilaterally. POSTERIOR CIRCULATION: --Basilar artery: Normal. --Posterior cerebral arteries: Normal. --Superior cerebellar arteries: Normal. --Inferior cerebellar arteries: Normal anterior and posterior inferior cerebellar arteries. VENOUS SINUSES: As permitted by contrast timing, patent. ANATOMIC VARIANTS: None DELAYED PHASE: No parenchymal contrast enhancement. Review of the MIP images confirms the above findings. IMPRESSION: 1. No emergent large vessel occlusion or hemodynamically significant intracranial stenosis. 2. Irregular fibrofatty plaque within the distal left common carotid artery and proximal left internal carotid artery, without hemodynamically significant stenosis. This appearance may be associated with an increased risk of plaque rupture. 3. Incompletely visualized right upper lobe mass. 4.  Aortic Atherosclerosis (ICD10-I70.0). Electronically Signed   By: KUlyses JarredM.D.   On: 04/09/2018 19:55   Dg Chest 2 View  Result Date: 04/09/2018 CLINICAL DATA:  Left facial droop, left hand numbness. EXAM: CHEST - 2 VIEW COMPARISON:  Radiographs of March 30, 2018. CT scan of February 27, 2018. FINDINGS: The heart size and mediastinal contours are  within normal limits. No pneumothorax or pleural effusion is noted. Left lung is clear. Stable right upper lobe lung mass is noted consistent with malignancy. The visualized skeletal structures are unremarkable. IMPRESSION: Stable right upper lobe lung mass is noted consistent with malignancy. No significant changes noted compared to prior exam. Electronically Signed   By: JMarijo Conception M.D.   On: 04/09/2018 16:53   Ct Angio Neck W Or Wo Contrast  Result Date: 04/09/2018 CLINICAL DATA:  Left facial droop and left arm numbness. EXAM: CT ANGIOGRAPHY HEAD AND NECK  TECHNIQUE: Multidetector CT imaging of the head and neck was performed using the standard protocol during bolus administration of intravenous contrast. Multiplanar CT image reconstructions and MIPs were obtained to evaluate the vascular anatomy. Carotid stenosis measurements (when applicable) are obtained utilizing NASCET criteria, using the distal internal carotid diameter as the denominator. CONTRAST:  37m ISOVUE-370 IOPAMIDOL (ISOVUE-370) INJECTION 76% COMPARISON:  None. FINDINGS: CT HEAD FINDINGS Brain: There is no mass, hemorrhage or extra-axial collection. There is generalized atrophy without lobar predilection. There is an old left frontal lobe infarct. There is hypoattenuation of the periventricular white matter, most commonly indicating chronic ischemic microangiopathy. Skull: The visualized skull base, calvarium and extracranial soft tissues are normal. Sinuses/Orbits: No fluid levels or advanced mucosal thickening of the visualized paranasal sinuses. No mastoid or middle ear effusion. The orbits are normal. CTA NECK FINDINGS AORTIC ARCH: There is mild calcific atherosclerosis of the aortic arch. There is no aneurysm, dissection or hemodynamically significant stenosis of the visualized ascending aorta and aortic arch. Conventional 3 vessel aortic branching pattern. The visualized proximal subclavian arteries are widely patent. RIGHT CAROTID  SYSTEM: --Common carotid artery: Widely patent origin without common carotid artery dissection or aneurysm. --Internal carotid artery: No dissection, occlusion or aneurysm. No hemodynamically significant stenosis. --External carotid artery: No acute abnormality. LEFT CAROTID SYSTEM: --Common carotid artery: Widely patent origin without common carotid artery dissection or aneurysm. --Internal carotid artery:There is irregular fibrofatty plaque within the distal left common carotid artery, extending into the proximal left internal carotid artery. There is no hemodynamically significant stenosis. --External carotid artery: No acute abnormality. VERTEBRAL ARTERIES: Codominant configuration. Both origins are normal. No dissection, occlusion or flow-limiting stenosis to the vertebrobasilar confluence. SKELETON: There is no bony spinal canal stenosis. No lytic or blastic lesion. OTHER NECK: Normal pharynx, larynx and major salivary glands. No cervical lymphadenopathy. Unremarkable thyroid gland. UPPER CHEST: Right upper lobe mass is incompletely visualized, but appears unchanged compared to the chest CT of 02/27/2018. Small right pleural effusion. CTA HEAD FINDINGS ANTERIOR CIRCULATION: --Intracranial internal carotid arteries: Normal. --Anterior cerebral arteries: Normal. Both A1 segments are present. Patent anterior communicating artery. --Middle cerebral arteries: Normal. --Posterior communicating arteries: Absent bilaterally. POSTERIOR CIRCULATION: --Basilar artery: Normal. --Posterior cerebral arteries: Normal. --Superior cerebellar arteries: Normal. --Inferior cerebellar arteries: Normal anterior and posterior inferior cerebellar arteries. VENOUS SINUSES: As permitted by contrast timing, patent. ANATOMIC VARIANTS: None DELAYED PHASE: No parenchymal contrast enhancement. Review of the MIP images confirms the above findings. IMPRESSION: 1. No emergent large vessel occlusion or hemodynamically significant intracranial  stenosis. 2. Irregular fibrofatty plaque within the distal left common carotid artery and proximal left internal carotid artery, without hemodynamically significant stenosis. This appearance may be associated with an increased risk of plaque rupture. 3. Incompletely visualized right upper lobe mass. 4.  Aortic Atherosclerosis (ICD10-I70.0). Electronically Signed   By: KUlyses JarredM.D.   On: 04/09/2018 19:55   Mr Brain Wo Contrast (neuro Protocol)  Result Date: 04/09/2018 CLINICAL DATA:  Transient ischemic attack EXAM: MRI HEAD WITHOUT CONTRAST TECHNIQUE: Multiplanar, multiecho pulse sequences of the brain and surrounding structures were obtained without intravenous contrast. COMPARISON:  Brain MRI 03/30/2018 FINDINGS: BRAIN: There is no acute infarct, acute hemorrhage or mass effect. The midline structures are normal. There is an old left frontal infarct. Multifocal white matter hyperintensity, most commonly due to chronic ischemic microangiopathy. Generalized atrophy without lobar predilection. Susceptibility-sensitive sequences show no chronic microhemorrhage or superficial siderosis. VASCULAR: Major intracranial arterial and venous sinus flow voids are preserved. SKULL AND UPPER  CERVICAL SPINE: The visualized skull base, calvarium, upper cervical spine and extracranial soft tissues are normal. SINUSES/ORBITS: No fluid levels or advanced mucosal thickening. No mastoid or middle ear effusion. The orbits are normal. IMPRESSION: 1. Atrophy and chronic small vessel disease without acute intracranial abnormality. 2. Old left frontal infarct. Electronically Signed   By: Ulyses Jarred M.D.   On: 04/09/2018 17:35    EKG: Independently reviewed.  Normal sinus rhythm incomplete right bundle branch block left anterior fascicular block abnormal EKG  Assessment/Plan Principal Problem:   TIA (transient ischemic attack) Active Problems:   Leukocytosis   History of CVA (cerebrovascular accident)   Smoker    Non-small cell lung cancer (HCC)   Hypokalemia   Antiplatelet therapy with asa and plavix,  , MRI brain, CTA brain and neck arteries unremarkable, 2D echo pending.  Monitor on telemetry, fasting lipid panel,  -Smoking cessation -Potassium replaced p.o. in the ED -Chronic leukocytosis dating back at least a month.  However it is decreasing.  No obvious infections at this time   DVT prophylaxis: scd  ( worsening anemia)  Code Status: full  Family Communication: Gust with the daughter at bedside  Disposition Plan: home tomorrow  Admission status: obs tele )   [place] Austad Johnson-Pitts MD [facility] Pager [phone]  If 7PM-7AM, please contact night-coverage [email or web address] Password [email or web address]  04/10/2018, 2:03 AM

## 2018-04-10 NOTE — Progress Notes (Signed)
SLP Cancellation Note  Patient Details Name: [name] MRN: [id number] DOB: 06/23/1941   Cancelled treatment:       Reason Eval/Treat Not Completed: SLP screened, no needs identified, will sign off; SLP screened Pt in room. Pt denies any changes in swallowing, speech, language, or cognition. MRI negative for acute changes. SLE will be deferred at this time. Reconsult if indicated. SLP will sign off.   Thank you,  Genene Churn, [place]    [place] 04/10/2018, 2:14 PM

## 2018-04-10 NOTE — Evaluation (Signed)
Occupational Therapy Evaluation Patient Details Name: [name] MRN: [id number] DOB: 16-Sep-1940 Today's Date: 04/10/2018    History of Present Illness [name] is a 77 y.o. female with medical history significant of lung cancer status post radiation last done April 2019,  tobacco abuse, TIA recently ,who presents with left-sided facial numbness droop left arm numbness.  Symptoms resolved after 30 minutes or so.  No further symptoms here in the ED.  Patient was seen for TIA about 10 days ago in the ED.  She was discharged home and  was supposed to follow-up but she has not.  Patient had worsening droop this episode than that episode.  Patient is on Plavix as she does have a remote history of stroke. MRI negative for acute infarct   Clinical Impression   Pt received supine in bed, husband and daughter present, agreeable to OT evaluation. Pt reporting initial symptoms of left side numbness have resolved. Pt is performing ADLs and functional mobility independently, family and pt reporting pt is at baseline. No further OT services required at this time.     Follow Up Recommendations  No OT follow up    Equipment Recommendations  None recommended by OT       Precautions / Restrictions Precautions Precautions: None Restrictions Weight Bearing Restrictions: No      Mobility Bed Mobility Overal bed mobility: Independent                Transfers Overall transfer level: Independent Equipment used: None                      ADL either performed or assessed with clinical judgement   ADL Overall ADL's : Independent Eating/Feeding: Independent;Sitting   Grooming: Wash/dry hands;Independent;Standing                   Toilet Transfer: Careers adviser;Ambulation   Toileting- Clothing Manipulation and Hygiene: Independent;Sit to/from stand       Functional mobility during ADLs: Independent       Vision Baseline Vision/History: Wears  glasses Wears Glasses: Reading only Patient Visual Report: No change from baseline Vision Assessment?: No apparent visual deficits            Pertinent Vitals/Pain Pain Assessment: No/denies pain     Hand Dominance Right   Extremity/Trunk Assessment Upper Extremity Assessment Upper Extremity Assessment: Overall WFL for tasks assessed(grossly 5/5 in BUE)   Lower Extremity Assessment Lower Extremity Assessment: Defer to PT evaluation   Cervical / Trunk Assessment Cervical / Trunk Assessment: Normal   Communication Communication Communication: No difficulties   Cognition Arousal/Alertness: Awake/alert Behavior During Therapy: WFL for tasks assessed/performed Overall Cognitive Status: Within Functional Limits for tasks assessed                                                Home Living Family/patient expects to be discharged to:: Private residence Living Arrangements: Spouse/significant other;Children Available Help at Discharge: Family Type of Home: House Home Access: Stairs to enter Technical brewer of Steps: 1 Entrance Stairs-Rails: None Home Layout: One level     Bathroom Shower/Tub: Occupational psychologist: Standard     Home Equipment: Environmental consultant - 2 wheels;Shower seat;Cane - single point          Prior Functioning/Environment Level of Independence: Independent  Comments: independent with ADLs, drives        OT Problem List: Decreased activity tolerance       AM-PAC PT "6 Clicks" Daily Activity     Outcome Measure Help from another person eating meals?: None Help from another person taking care of personal grooming?: None Help from another person toileting, which includes using toliet, bedpan, or urinal?: None Help from another person bathing (including washing, rinsing, drying)?: None Help from another person to put on and taking off regular upper body clothing?: None Help from another person to put on and  taking off regular lower body clothing?: None 6 Click Score: 24   End of Session    Activity Tolerance: Patient tolerated treatment well Patient left: in bed;with call bell/phone within reach;with family/visitor present  OT Visit Diagnosis: Muscle weakness (generalized) (M62.81)                Time: 2824-1753 OT Time Calculation (min): 11 min Charges:  OT General Charges $OT Visit: 1 Visit OT Evaluation $OT Eval Low Complexity: [place], OTR/L  [phone] 04/10/2018, 8:26 AM

## 2018-04-10 NOTE — Progress Notes (Signed)
[name] discharged Home per MD order.  Discharge instructions reviewed and discussed with the patient, all questions and concerns answered. Copy of instructions and scripts given to patient.  Allergies as of 04/10/2018      Reactions   Codeine Nausea And Vomiting   Lipitor [atorvastatin] Nausea And Vomiting   Phenergan [promethazine Hcl] Other (See Comments)   Confusion, hallucinations, severe agitation      Medication List    TAKE these medications   acetaminophen 325 MG tablet Commonly known as:  TYLENOL Take 325 mg by mouth every 6 (six) hours as needed for mild pain.   aspirin EC 325 MG tablet Take 1 tablet (325 mg total) by mouth daily.   clopidogrel 75 MG tablet Commonly known as:  PLAVIX TAKE 1 TABLET BY MOUTH  DAILY   feeding supplement (GLUCERNA SHAKE) Liqd Take 237 mLs by mouth 3 (three) times daily between meals.   glucose blood test strip Use as instructed   guaiFENesin 600 MG 12 hr tablet Commonly known as:  MUCINEX Take 1 tablet (600 mg total) by mouth 2 (two) times daily.   magnesium oxide 400 (241.3 Mg) MG tablet Commonly known as:  MAG-OX Take 1 tablet (400 mg total) by mouth 2 (two) times daily.   megestrol 400 MG/10ML suspension Commonly known as:  MEGACE Take 10 mLs (400 mg total) by mouth 2 (two) times daily. What changed:    when to take this  reasons to take this   multivitamin with minerals Tabs tablet Take 1 tablet by mouth daily.   ONE TOUCH ULTRA 2 w/Device Kit 1 Device by Does not apply route 2 (two) times daily.   onetouch ultrasoft lancets Use as instructed   potassium & sodium phosphates 280-160-250 MG Pack Commonly known as:  PHOS-NAK Take 1 packet by mouth 4 (four) times daily -  with meals and at bedtime.   rosuvastatin 20 MG tablet Commonly known as:  CRESTOR Take 1 tablet (20 mg total) by mouth daily. What changed:  when to take this   sitaGLIPtin-metformin 50-1000 MG tablet Commonly known as:  JANUMET Take  1 tablet by mouth 2 (two) times daily. What changed:  when to take this       Patients skin is clean, dry and intact, no evidence of skin break down. IV site discontinued and catheter remains intact. Site without signs and symptoms of complications. Dressing and pressure applied.  Patient escorted to car in a wheelchair,  no distress noted upon discharge.  Ralene Muskrat Cissy Galbreath 04/10/2018 6:36 PM

## 2018-04-10 NOTE — Evaluation (Signed)
Physical Therapy Evaluation Patient Details Name: [name] MRN: [id number] DOB: 1940/12/14 Today's Date: 04/10/2018   History of Present Illness  [name] is a 77 y.o. female with medical history significant of lung cancer status post radiation last done April 2019,  tobacco abuse, TIA recently ,who presents with left-sided facial numbness droop left arm numbness.  Symptoms resolved after 30 minutes or so.  No further symptoms here in the ED.  Patient was seen for TIA about 10 days ago in the ED.  She was discharged home and  was supposed to follow-up but she has not.  Patient had worsening droop this episode than that episode.  Patient is on Plavix as she does have a remote history of stroke. MRI negative for acute infarct    Clinical Impression  Patient functioning at baseline for functional mobility and gait.  Plan:  Patient discharged from physical therapy to care of nursing for ambulation daily as tolerated for length of stay.    Follow Up Recommendations No PT follow up    Equipment Recommendations  None recommended by PT    Recommendations for Other Services       Precautions / Restrictions Precautions Precautions: None Restrictions Weight Bearing Restrictions: No      Mobility  Bed Mobility Overal bed mobility: Independent                Transfers Overall transfer level: Independent Equipment used: None                Ambulation/Gait Ambulation/Gait assistance: Independent     Gait Pattern/deviations: WFL(Within Functional Limits) Gait velocity: slightly decreased   General Gait Details: Patient demonstrates good return for ambulation on level surfaces, up/down ramps without loss of balance  Stairs            Wheelchair Mobility    Modified Rankin (Stroke Patients Only)       Balance Overall balance assessment: No apparent balance deficits (not formally assessed)                                            Pertinent Vitals/Pain Pain Assessment: No/denies pain    Home Living Family/patient expects to be discharged to:: Private residence Living Arrangements: Spouse/significant other;Children Available Help at Discharge: Family Type of Home: House Home Access: Stairs to enter Entrance Stairs-Rails: None Entrance Stairs-Number of Steps: 1 Home Layout: One level Home Equipment: Environmental consultant - 2 wheels;Shower seat;Cane - single point;Bedside commode      Prior Function Level of Independence: Independent         Comments: independent with ADLs, drives     Hand Dominance   Dominant Hand: Right    Extremity/Trunk Assessment   Upper Extremity Assessment Upper Extremity Assessment: Defer to OT evaluation    Lower Extremity Assessment Lower Extremity Assessment: Overall WFL for tasks assessed    Cervical / Trunk Assessment Cervical / Trunk Assessment: Normal  Communication   Communication: No difficulties  Cognition Arousal/Alertness: Awake/alert Behavior During Therapy: WFL for tasks assessed/performed Overall Cognitive Status: Within Functional Limits for tasks assessed                                        General Comments      Exercises  Assessment/Plan    PT Assessment Patent does not need any further PT services  PT Problem List         PT Treatment Interventions      PT Goals (Current goals can be found in the Care Plan section)  Acute Rehab PT Goals Patient Stated Goal: return home PT Goal Formulation: With patient/family Time For Goal Achievement: 04/10/18 Potential to Achieve Goals: Good    Frequency     Barriers to discharge        Co-evaluation               AM-PAC PT "6 Clicks" Daily Activity  Outcome Measure Difficulty turning over in bed (including adjusting bedclothes, sheets and blankets)?: None Difficulty moving from lying on back to sitting on the side of the bed? : None Difficulty sitting down on and  standing up from a chair with arms (e.g., wheelchair, bedside commode, etc,.)?: None Help needed moving to and from a bed to chair (including a wheelchair)?: None Help needed walking in hospital room?: None Help needed climbing 3-5 steps with a railing? : None 6 Click Score: 24    End of Session   Activity Tolerance: Patient tolerated treatment well Patient left: in bed;with call bell/phone within reach;with family/visitor present Nurse Communication: Mobility status PT Visit Diagnosis: Unsteadiness on feet (R26.81);Other abnormalities of gait and mobility (R26.89);Muscle weakness (generalized) (M62.81)    Time: 7209-4709 PT Time Calculation (min) (ACUTE ONLY): 16 min   Charges:   PT Evaluation $PT Eval Low Complexity: 1 Low PT Treatments $Therapeutic Activity: 8-22 mins        2:47 PM, 04/10/18 Lonell Grandchild, MPT Physical Therapist with [facility] [phone] office [phone] mobile phone

## 2018-04-10 NOTE — Telephone Encounter (Signed)
Received call from IM Dr .Manuella Ghazi who requests 30 day monitor on patient for stroke. Please help arranging. Being DC today likely from [facility]. Dayna Dunn PA-C

## 2018-04-10 NOTE — Care Management CC44 (Signed)
Condition Code 44 Documentation Completed  Patient Details  Name: KIMBERLIE CSASZAR MRN: [id number] Date of Birth: 16-Nov-1940   Condition Code 44 given:    Patient signature on Condition Code 44 notice:    Documentation of 2 MD's agreement:    Code 44 added to claim:       Sherald Barge, RN 04/10/2018, 1:46 PM

## 2018-04-10 NOTE — Progress Notes (Signed)
  Echocardiogram 2D Echocardiogram has been performed.  [name] [name] 04/10/2018, 11:54 AM

## 2018-04-10 NOTE — Discharge Summary (Signed)
Physician Discharge Summary  [name] QHU:[id number] DOB: 18-Oct-1940 DOA: 04/09/2018  PCP: [name], MD  Admit date: 04/09/2018  Discharge date: 04/10/2018  Admitted From:Home  Disposition:  Home  Recommendations for Outpatient Follow-up:  1. Follow up with PCP in 1-2 weeks 2. Follow-up with neurology in 4 weeks  Home Health: None  Equipment/Devices: None  Discharge Condition: Stable  CODE STATUS: Full  Diet recommendation: Heart Healthy  Brief/Interim Summary:  Per HPI: [name] is a 77 y.o. female with medical history significant of lung cancer status post radiation last done April 2019,  tobacco abuse, TIA recently ,who presents with left-sided facial numbness droop left arm numbness.  Symptoms resolved after 30 minutes or so.  No further symptoms here in the ED.  Patient was seen for TIA about 10 days ago in the ED.  She was discharged home and  was supposed to follow-up but she has not.  Patient had worsening droop this episode than that episode.  Patient is on Plavix as she does have a remote history of stroke.  Patient has right upper lobe lung cancer and last had radiation in April of this year .she is to follow-up in October.  She continues to smoke cigarettes  CT angios the head and neck negative for any acute findings as well as MRI of the brain with no acute findings.  Tele-neurology has recommended 2D echocardiogram which was performed demonstrating LVEF 60 to 65% with no other acute findings.  Aspirin has been added to Plavix which she will remain on at home.  Fasting lipid profile demonstrates LDL in the 30s, but continue statin for now if tolerated.  No further symptomatology noted.  Patient has been seen by PT/OT with no recommendations for outpatient PT or home health.  She is without any further deficits at this point in time and has had no other acute events during the course of her stay.  She will follow-up with her neurologist in 4 weeks.  She  will also be set up for Holter monitoring for the next 30 days per cardiology.  Discharge Diagnoses:  Principal Problem:   TIA (transient ischemic attack) Active Problems:   Leukocytosis   History of CVA (cerebrovascular accident)   Smoker   Non-small cell lung cancer ([place])   Hypokalemia    Discharge Instructions  Discharge Instructions    Diet - low sodium heart healthy   Complete by:  As directed    Increase activity slowly   Complete by:  As directed      Allergies as of 04/10/2018      Reactions   Codeine Nausea And Vomiting   Lipitor [atorvastatin] Nausea And Vomiting   Phenergan [promethazine Hcl] Other (See Comments)   Confusion, hallucinations, severe agitation      Medication List    TAKE these medications   acetaminophen 325 MG tablet Commonly known as:  TYLENOL Take 325 mg by mouth every 6 (six) hours as needed for mild pain.   aspirin EC 325 MG tablet Take 1 tablet (325 mg total) by mouth daily.   clopidogrel 75 MG tablet Commonly known as:  PLAVIX TAKE 1 TABLET BY MOUTH  DAILY   feeding supplement (GLUCERNA SHAKE) Liqd Take 237 mLs by mouth 3 (three) times daily between meals.   glucose blood test strip Use as instructed   guaiFENesin 600 MG 12 hr tablet Commonly known as:  MUCINEX Take 1 tablet (600 mg total) by mouth 2 (two) times daily.  magnesium oxide 400 (241.3 Mg) MG tablet Commonly known as:  MAG-OX Take 1 tablet (400 mg total) by mouth 2 (two) times daily.   megestrol 400 MG/10ML suspension Commonly known as:  MEGACE Take 10 mLs (400 mg total) by mouth 2 (two) times daily. What changed:    when to take this  reasons to take this   multivitamin with minerals Tabs tablet Take 1 tablet by mouth daily.   ONE TOUCH ULTRA 2 w/Device Kit 1 Device by Does not apply route 2 (two) times daily.   onetouch ultrasoft lancets Use as instructed   potassium & sodium phosphates 280-160-250 MG Pack Commonly known as:  PHOS-NAK Take 1  packet by mouth 4 (four) times daily -  with meals and at bedtime.   rosuvastatin 20 MG tablet Commonly known as:  CRESTOR Take 1 tablet (20 mg total) by mouth daily. What changed:  when to take this   sitaGLIPtin-metformin 50-1000 MG tablet Commonly known as:  JANUMET Take 1 tablet by mouth 2 (two) times daily. What changed:  when to take this      Follow-up Information    [name], MD Follow up in 1 week(s).   Specialty:  Family Medicine Contact information: [place] [address] [phone]        Rosalin Hawking, MD. Schedule an appointment as soon as possible for a visit in 4 week(s).   Specialty:  Neurology         Allergies  Allergen Reactions  . Codeine Nausea And Vomiting  . Lipitor [Atorvastatin] Nausea And Vomiting  . Phenergan [Promethazine Hcl] Other (See Comments)    Confusion, hallucinations, severe agitation    Consultations:  Tele Neurology   Procedures/Studies: Ct Angio Head W Or Wo Contrast  Result Date: 04/09/2018 CLINICAL DATA:  Left facial droop and left arm numbness. EXAM: CT ANGIOGRAPHY HEAD AND NECK TECHNIQUE: Multidetector CT imaging of the head and neck was performed using the standard protocol during bolus administration of intravenous contrast. Multiplanar CT image reconstructions and MIPs were obtained to evaluate the vascular anatomy. Carotid stenosis measurements (when applicable) are obtained utilizing NASCET criteria, using the distal internal carotid diameter as the denominator. CONTRAST:  52m ISOVUE-370 IOPAMIDOL (ISOVUE-370) INJECTION 76% COMPARISON:  None. FINDINGS: CT HEAD FINDINGS Brain: There is no mass, hemorrhage or extra-axial collection. There is generalized atrophy without lobar predilection. There is an old left frontal lobe infarct. There is hypoattenuation of the periventricular white matter, most commonly indicating chronic ischemic microangiopathy. Skull: The visualized skull base, calvarium  and extracranial soft tissues are normal. Sinuses/Orbits: No fluid levels or advanced mucosal thickening of the visualized paranasal sinuses. No mastoid or middle ear effusion. The orbits are normal. CTA NECK FINDINGS AORTIC ARCH: There is mild calcific atherosclerosis of the aortic arch. There is no aneurysm, dissection or hemodynamically significant stenosis of the visualized ascending aorta and aortic arch. Conventional 3 vessel aortic branching pattern. The visualized proximal subclavian arteries are widely patent. RIGHT CAROTID SYSTEM: --Common carotid artery: Widely patent origin without common carotid artery dissection or aneurysm. --Internal carotid artery: No dissection, occlusion or aneurysm. No hemodynamically significant stenosis. --External carotid artery: No acute abnormality. LEFT CAROTID SYSTEM: --Common carotid artery: Widely patent origin without common carotid artery dissection or aneurysm. --Internal carotid artery:There is irregular fibrofatty plaque within the distal left common carotid artery, extending into the proximal left internal carotid artery. There is no hemodynamically significant stenosis. --External carotid artery: No acute abnormality. VERTEBRAL ARTERIES: Codominant configuration.  Both origins are normal. No dissection, occlusion or flow-limiting stenosis to the vertebrobasilar confluence. SKELETON: There is no bony spinal canal stenosis. No lytic or blastic lesion. OTHER NECK: Normal pharynx, larynx and major salivary glands. No cervical lymphadenopathy. Unremarkable thyroid gland. UPPER CHEST: Right upper lobe mass is incompletely visualized, but appears unchanged compared to the chest CT of 02/27/2018. Small right pleural effusion. CTA HEAD FINDINGS ANTERIOR CIRCULATION: --Intracranial internal carotid arteries: Normal. --Anterior cerebral arteries: Normal. Both A1 segments are present. Patent anterior communicating artery. --Middle cerebral arteries: Normal. --Posterior  communicating arteries: Absent bilaterally. POSTERIOR CIRCULATION: --Basilar artery: Normal. --Posterior cerebral arteries: Normal. --Superior cerebellar arteries: Normal. --Inferior cerebellar arteries: Normal anterior and posterior inferior cerebellar arteries. VENOUS SINUSES: As permitted by contrast timing, patent. ANATOMIC VARIANTS: None DELAYED PHASE: No parenchymal contrast enhancement. Review of the MIP images confirms the above findings. IMPRESSION: 1. No emergent large vessel occlusion or hemodynamically significant intracranial stenosis. 2. Irregular fibrofatty plaque within the distal left common carotid artery and proximal left internal carotid artery, without hemodynamically significant stenosis. This appearance may be associated with an increased risk of plaque rupture. 3. Incompletely visualized right upper lobe mass. 4.  Aortic Atherosclerosis (ICD10-I70.0). Electronically Signed   By: Ulyses Jarred M.D.   On: 04/09/2018 19:55   Dg Chest 2 View  Result Date: 04/09/2018 CLINICAL DATA:  Left facial droop, left hand numbness. EXAM: CHEST - 2 VIEW COMPARISON:  Radiographs of March 30, 2018. CT scan of February 27, 2018. FINDINGS: The heart size and mediastinal contours are within normal limits. No pneumothorax or pleural effusion is noted. Left lung is clear. Stable right upper lobe lung mass is noted consistent with malignancy. The visualized skeletal structures are unremarkable. IMPRESSION: Stable right upper lobe lung mass is noted consistent with malignancy. No significant changes noted compared to prior exam. Electronically Signed   By: Marijo Conception, M.D.   On: 04/09/2018 16:53   Dg Chest 2 View  Result Date: 03/30/2018 CLINICAL DATA:  Cough EXAM: CHEST - 2 VIEW COMPARISON:  CT 02/27/2018, radiograph 02/27/2018 FINDINGS: A right upper lobe lung mass appears increased in size and more consolidated. There are adjacent pleural changes. Stable cardiomediastinal silhouette with aortic  atherosclerosis. No pneumothorax. There are degenerative changes of the spine. IMPRESSION: Increased size and density of right upper lobe lung mass with adjacent pleural changes as compared with radiograph 02/27/2018. Otherwise no significant interval change. Electronically Signed   By: Donavan Foil M.D.   On: 03/30/2018 19:41   Ct Angio Neck W Or Wo Contrast  Result Date: 04/09/2018 CLINICAL DATA:  Left facial droop and left arm numbness. EXAM: CT ANGIOGRAPHY HEAD AND NECK TECHNIQUE: Multidetector CT imaging of the head and neck was performed using the standard protocol during bolus administration of intravenous contrast. Multiplanar CT image reconstructions and MIPs were obtained to evaluate the vascular anatomy. Carotid stenosis measurements (when applicable) are obtained utilizing NASCET criteria, using the distal internal carotid diameter as the denominator. CONTRAST:  3m ISOVUE-370 IOPAMIDOL (ISOVUE-370) INJECTION 76% COMPARISON:  None. FINDINGS: CT HEAD FINDINGS Brain: There is no mass, hemorrhage or extra-axial collection. There is generalized atrophy without lobar predilection. There is an old left frontal lobe infarct. There is hypoattenuation of the periventricular white matter, most commonly indicating chronic ischemic microangiopathy. Skull: The visualized skull base, calvarium and extracranial soft tissues are normal. Sinuses/Orbits: No fluid levels or advanced mucosal thickening of the visualized paranasal sinuses. No mastoid or middle ear effusion. The orbits are normal. CTA NECK  FINDINGS AORTIC ARCH: There is mild calcific atherosclerosis of the aortic arch. There is no aneurysm, dissection or hemodynamically significant stenosis of the visualized ascending aorta and aortic arch. Conventional 3 vessel aortic branching pattern. The visualized proximal subclavian arteries are widely patent. RIGHT CAROTID SYSTEM: --Common carotid artery: Widely patent origin without common carotid artery  dissection or aneurysm. --Internal carotid artery: No dissection, occlusion or aneurysm. No hemodynamically significant stenosis. --External carotid artery: No acute abnormality. LEFT CAROTID SYSTEM: --Common carotid artery: Widely patent origin without common carotid artery dissection or aneurysm. --Internal carotid artery:There is irregular fibrofatty plaque within the distal left common carotid artery, extending into the proximal left internal carotid artery. There is no hemodynamically significant stenosis. --External carotid artery: No acute abnormality. VERTEBRAL ARTERIES: Codominant configuration. Both origins are normal. No dissection, occlusion or flow-limiting stenosis to the vertebrobasilar confluence. SKELETON: There is no bony spinal canal stenosis. No lytic or blastic lesion. OTHER NECK: Normal pharynx, larynx and major salivary glands. No cervical lymphadenopathy. Unremarkable thyroid gland. UPPER CHEST: Right upper lobe mass is incompletely visualized, but appears unchanged compared to the chest CT of 02/27/2018. Small right pleural effusion. CTA HEAD FINDINGS ANTERIOR CIRCULATION: --Intracranial internal carotid arteries: Normal. --Anterior cerebral arteries: Normal. Both A1 segments are present. Patent anterior communicating artery. --Middle cerebral arteries: Normal. --Posterior communicating arteries: Absent bilaterally. POSTERIOR CIRCULATION: --Basilar artery: Normal. --Posterior cerebral arteries: Normal. --Superior cerebellar arteries: Normal. --Inferior cerebellar arteries: Normal anterior and posterior inferior cerebellar arteries. VENOUS SINUSES: As permitted by contrast timing, patent. ANATOMIC VARIANTS: None DELAYED PHASE: No parenchymal contrast enhancement. Review of the MIP images confirms the above findings. IMPRESSION: 1. No emergent large vessel occlusion or hemodynamically significant intracranial stenosis. 2. Irregular fibrofatty plaque within the distal left common carotid artery  and proximal left internal carotid artery, without hemodynamically significant stenosis. This appearance may be associated with an increased risk of plaque rupture. 3. Incompletely visualized right upper lobe mass. 4.  Aortic Atherosclerosis (ICD10-I70.0). Electronically Signed   By: Ulyses Jarred M.D.   On: 04/09/2018 19:55   Mr Brain Wo Contrast (neuro Protocol)  Result Date: 04/09/2018 CLINICAL DATA:  Transient ischemic attack EXAM: MRI HEAD WITHOUT CONTRAST TECHNIQUE: Multiplanar, multiecho pulse sequences of the brain and surrounding structures were obtained without intravenous contrast. COMPARISON:  Brain MRI 03/30/2018 FINDINGS: BRAIN: There is no acute infarct, acute hemorrhage or mass effect. The midline structures are normal. There is an old left frontal infarct. Multifocal white matter hyperintensity, most commonly due to chronic ischemic microangiopathy. Generalized atrophy without lobar predilection. Susceptibility-sensitive sequences show no chronic microhemorrhage or superficial siderosis. VASCULAR: Major intracranial arterial and venous sinus flow voids are preserved. SKULL AND UPPER CERVICAL SPINE: The visualized skull base, calvarium, upper cervical spine and extracranial soft tissues are normal. SINUSES/ORBITS: No fluid levels or advanced mucosal thickening. No mastoid or middle ear effusion. The orbits are normal. IMPRESSION: 1. Atrophy and chronic small vessel disease without acute intracranial abnormality. 2. Old left frontal infarct. Electronically Signed   By: Ulyses Jarred M.D.   On: 04/09/2018 17:35   Mr Brain Wo Contrast  Result Date: 03/30/2018 CLINICAL DATA:  Left hand weakness and numbness beginning 1630 hours. Temporal headache. EXAM: MRI HEAD WITHOUT CONTRAST TECHNIQUE: Multiplanar, multiecho pulse sequences of the brain and surrounding structures were obtained without intravenous contrast. COMPARISON:  02/28/2018 FINDINGS: Brain: Diffusion imaging does not show any acute or  subacute infarction. No focal insult affects the brainstem or cerebellum there is generalized atrophy. Cerebral hemispheres show generalized atrophy with  an old left frontal cortical and subcortical infarction. There are old small vessel infarctions within the deep and subcortical white matter. No evidence of mass lesion, hemorrhage, hydrocephalus or extra-axial collection. Vascular: Major vessels at the base of the brain show flow. Skull and upper cervical spine: Negative Sinuses/Orbits: Mucosal inflammatory changes of the right maxillary sinus. Other sinuses are clear. Orbits are negative. Other: None IMPRESSION: No acute or subacute finding by MRI. Generalized atrophy. Old left frontal cortical and subcortical infarction. Chronic small-vessel ischemic changes of the hemispheric white matter. Electronically Signed   By: Nelson Chimes M.D.   On: 03/30/2018 19:26   Discharge Exam: Vitals:   04/10/18 1330 04/10/18 1530  BP: (!) 122/57 (!) 122/55  Pulse: 76 81  Resp:    Temp: 98.4 F (36.9 C) 98.1 F (36.7 C)  SpO2:  99%   Vitals:   04/10/18 0930 04/10/18 1206 04/10/18 1330 04/10/18 1530  BP: (!) 118/55 (!) 163/75 (!) 122/57 (!) 122/55  Pulse: 76 87 76 81  Resp: 17     Temp: 98.1 F (36.7 C) 98.2 F (36.8 C) 98.4 F (36.9 C) 98.1 F (36.7 C)  TempSrc: Oral Oral Oral Oral  SpO2: 99% 98%  99%  Weight:      Height:        General: Pt is alert, awake, not in acute distress Cardiovascular: RRR, S1/S2 +, no rubs, no gallops Respiratory: CTA bilaterally, no wheezing, no rhonchi Abdominal: Soft, NT, ND, bowel sounds + Extremities: no edema, no cyanosis    The results of significant diagnostics from this hospitalization (including imaging, microbiology, ancillary and laboratory) are listed below for reference.     Microbiology: No results found for this or any previous visit (from the past 240 hour(s)).   Labs: BNP (last 3 results) No results for input(s): BNP in the last 8760  hours. Basic Metabolic Panel: Recent Labs  Lab 04/09/18 1734  NA 138  K 3.3*  CL 104  CO2 24  GLUCOSE 201*  BUN 8  CREATININE 0.95  CALCIUM 9.1   Liver Function Tests: Recent Labs  Lab 04/09/18 1734  AST 14*  ALT 10  ALKPHOS 66  BILITOT 0.4  PROT 6.4*  ALBUMIN 3.1*   No results for input(s): LIPASE, AMYLASE in the last 168 hours. No results for input(s): AMMONIA in the last 168 hours. CBC: Recent Labs  Lab 04/09/18 1734  WBC 14.4*  NEUTROABS 10.5*  HGB 9.7*  HCT 30.2*  MCV 90.4  PLT 698*   Cardiac Enzymes: Recent Labs  Lab 04/09/18 1734  TROPONINI <0.03   BNP: Invalid input(s): POCBNP CBG: Recent Labs  Lab 04/10/18 0802 04/10/18 1136 04/10/18 1615  GLUCAP 170* 154* 182*   D-Dimer No results for input(s): DDIMER in the last 72 hours. Hgb A1c Recent Labs    04/09/18 1734  HGBA1C 9.4*   Lipid Profile Recent Labs    04/10/18 0457  CHOL 88  HDL 28*  LDLCALC 37  TRIG 117  CHOLHDL 3.1   Thyroid function studies No results for input(s): TSH, T4TOTAL, T3FREE, THYROIDAB in the last 72 hours.  Invalid input(s): FREET3 Anemia work up No results for input(s): VITAMINB12, FOLATE, FERRITIN, TIBC, IRON, RETICCTPCT in the last 72 hours. Urinalysis    Component Value Date/Time   COLORURINE YELLOW 04/10/2018 0400   APPEARANCEUR CLEAR 04/10/2018 0400   LABSPEC 1.029 04/10/2018 0400   LABSPEC 1.020 07/04/2017 1051   PHURINE 6.0 04/10/2018 0400   GLUCOSEU >=500 (A) 04/10/2018 0400  HGBUR NEGATIVE 04/10/2018 0400   BILIRUBINUR NEGATIVE 04/10/2018 0400   BILIRUBINUR negative 07/04/2017 1051   KETONESUR NEGATIVE 04/10/2018 0400   PROTEINUR NEGATIVE 04/10/2018 0400   UROBILINOGEN 0.2 07/25/2013 0952   NITRITE NEGATIVE 04/10/2018 0400   LEUKOCYTESUR NEGATIVE 04/10/2018 0400   Sepsis Labs Invalid input(s): PROCALCITONIN,  WBC,  LACTICIDVEN Microbiology No results found for this or any previous visit (from the past 240 hour(s)).   Time  coordinating discharge: 35 minutes  SIGNED:   Rodena Goldmann, DO [facility] 04/10/2018, 5:21 PM Pager [phone]  If 7PM-7AM, please contact night-coverage [email or web address] Password [email or web address]

## 2018-04-10 NOTE — Consult Note (Signed)
Galt A. Merlene Laughter, MD     [email or web address]          [name] is an 77 y.o. female.   ASSESSMENT/PLAN: The chart reviewed and images also reviewed.  Significant findings is an remote infarct involving the left frontal region.  The patient left without being seen.   Blood pressure (!) 136/58, pulse 88, temperature 98.1 F (36.7 C), temperature source Oral, resp. rate 17, height '5\' 6"'  (1.676 m), weight 61.6 kg, SpO2 100 %.  Past Medical History:  Diagnosis Date  . Carotid artery occlusion   . Clostridium difficile infection 06/2013  . Diabetes mellitus    takes Janumet daily  . Dyslipidemia    takes Crestor daily  . Gallstones   . GERD (gastroesophageal reflux disease)   . Heart murmur    hx of  . History of radiation therapy 06/11/17-06/21/17   right lung 50 Gy in 5 fractions  . Hypertension    takes Prinzide and Verapamil daily  . Impaired speech    from stroke  . Neuropathy, diabetic ([place])   . Pneumonia   . PONV (postoperative nausea and vomiting)   . Smoker   . Stroke ([place]) 06/25/13  . Vertigo    but doesn't take any meds    Past Surgical History:  Procedure Laterality Date  . cataract removed Right   . CHOLECYSTECTOMY    . ENDARTERECTOMY Left 07/14/2013   Procedure: ENDARTERECTOMY CAROTID-LEFT;  Surgeon: Elam Dutch, MD;  Location: [facility];  Service: Vascular;  Laterality: Left;  . IR PERC PLEURAL DRAIN W/INDWELL CATH W/IMG GUIDE  05/23/2017  . KNEE SURGERY Left 36yr ago  . LAPAROSCOPIC PARTIAL COLECTOMY N/A 09/02/2015   Procedure: LAPAROSCOPIC PARTIAL RIGHT COLECTOMY;  Surgeon: ALeighton Ruff MD;  Location: [facility];  Service: General;  Laterality: N/A;  . PATCH ANGIOPLASTY Left 07/14/2013   Procedure: LEFT CAROTID ARTERY PATCH ANGIOPLASTY;  Surgeon: CElam Dutch MD;  [facility];  Service: Vascular;  Laterality: Left;    Family History  Problem Relation Age of Onset  . Leukemia Mother   . Diabetes Mother   .  Cancer Father        esophageal ca  . Heart disease Father   . Diabetes Maternal Aunt   . Diabetes Maternal Uncle   . Diabetes Brother   . Diabetes Sister   . Heart attack Brother        x 2    Social History:  reports that she has been smoking cigarettes. She has a 30.00 pack-year smoking history. She has never used smokeless tobacco. She reports that she does not drink alcohol or use drugs.  Allergies:  Allergies  Allergen Reactions  . Codeine Nausea And Vomiting  . Lipitor [Atorvastatin] Nausea And Vomiting  . Phenergan [Promethazine Hcl] Other (See Comments)    Confusion, hallucinations, severe agitation    Medications: Prior to Admission medications   Medication Sig Start Date End Date Taking? Authorizing Provider  acetaminophen (TYLENOL) 325 MG tablet Take 325 mg by mouth every 6 (six) hours as needed for mild pain.    Yes [provider]  clopidogrel (PLAVIX) 75 MG tablet TAKE 1 TABLET BY MOUTH  DAILY Patient taking differently: Take 75 mg by mouth daily.  12/12/17  Yes LDenita Lung MD  megestrol (MEGACE) 400 MG/10ML suspension Take 10 mLs (400 mg total) by mouth 2 (two) times daily. Patient taking differently: Take 400 mg by mouth daily as needed (to increase  appetite).  03/02/18  Yes Hosie Poisson, MD  Multiple Vitamin (MULTIVITAMIN WITH MINERALS) TABS tablet Take 1 tablet by mouth daily. 03/03/18  Yes Hosie Poisson, MD  rosuvastatin (CRESTOR) 20 MG tablet Take 1 tablet (20 mg total) by mouth daily. Patient taking differently: Take 20 mg by mouth at bedtime.  02/25/18  Yes [name], MD  sitaGLIPtin-metformin (JANUMET) 50-1000 MG tablet Take 1 tablet by mouth 2 (two) times daily. Patient taking differently: Take 1 tablet by mouth 2 (two) times daily before a meal.  11/13/17  Yes [name], MD  aspirin EC 325 MG tablet Take 1 tablet (325 mg total) by mouth daily. 04/10/18 04/10/19  Manuella Ghazi, Pratik D, DO  Blood Glucose Monitoring Suppl (ONE TOUCH ULTRA 2)  w/Device KIT 1 Device by Does not apply route 2 (two) times daily. 08/21/17   [name], MD  feeding supplement, GLUCERNA SHAKE, (GLUCERNA SHAKE) LIQD Take 237 mLs by mouth 3 (three) times daily between meals. Patient not taking: Reported on 03/07/2018 03/02/18   Hosie Poisson, MD  glucose blood (ONE TOUCH ULTRA TEST) test strip Use as instructed 02/10/18   [name], MD  guaiFENesin (MUCINEX) 600 MG 12 hr tablet Take 1 tablet (600 mg total) by mouth 2 (two) times daily. Patient not taking: Reported on 03/30/2018 03/02/18   Hosie Poisson, MD  Lancets [facility] ULTRASOFT) lancets Use as instructed 08/21/17   [name], MD  magnesium oxide (MAG-OX) 400 (241.3 Mg) MG tablet Take 1 tablet (400 mg total) by mouth 2 (two) times daily. Patient not taking: Reported on 03/30/2018 03/02/18   Hosie Poisson, MD  potassium & sodium phosphates (PHOS-NAK) 280-160-250 MG PACK Take 1 packet by mouth 4 (four) times daily -  with meals and at bedtime. Patient not taking: Reported on 03/10/2018 03/02/18   Hosie Poisson, MD    Scheduled Meds: . clopidogrel  75 mg Oral Daily  . Influenza vac split quadrivalent PF  0.5 mL Intramuscular Tomorrow-1000  . insulin aspart  0-9 Units Subcutaneous TID WC  . multivitamin with minerals  1 tablet Oral Daily  . pneumococcal 23 valent vaccine  0.5 mL Intramuscular Tomorrow-1000  . rosuvastatin  20 mg Oral QHS   Continuous Infusions: PRN Meds:.acetaminophen **OR** acetaminophen (TYLENOL) oral liquid 160 mg/5 mL **OR** acetaminophen, acetaminophen, guaiFENesin-dextromethorphan, menthol-cetylpyridinium, senna-docusate     Results for orders placed or performed during the hospital encounter of 04/09/18 (from the past 48 hour(s))  Ethanol     Status: None   Collection Time: 04/09/18  5:34 PM  Result Value Ref Range   Alcohol, Ethyl (B) <10 <10 mg/dL    Comment: (NOTE) Lowest detectable limit for serum alcohol is 10 mg/dL. For medical purposes only. Performed at  [facility], [address] [place], [place] [address]   Protime-INR     Status: None   Collection Time: 04/09/18  5:34 PM  Result Value Ref Range   Prothrombin Time 13.2 11.4 - 15.2 seconds   INR 1.01     Comment: Performed at [facility], [address], [place], [place] [address]  APTT     Status: None   Collection Time: 04/09/18  5:34 PM  Result Value Ref Range   aPTT 30 24 - 36 seconds    Comment: Performed at [facility], [address] [place], [place] [address]  CBC     Status: Abnormal   Collection Time: 04/09/18  5:34 PM  Result Value Ref Range   WBC  14.4 (H) 4.0 - 10.5 K/uL   RBC 3.34 (L) 3.87 - 5.11 MIL/uL   Hemoglobin 9.7 (L) 12.0 - 15.0 g/dL   HCT 30.2 (L) 36.0 - 46.0 %   MCV 90.4 78.0 - 100.0 fL   MCH 29.0 26.0 - 34.0 pg   MCHC 32.1 30.0 - 36.0 g/dL   RDW 16.1 (H) 11.5 - 15.5 %   Platelets 698 (H) 150 - 400 K/uL    Comment: Performed at [facility], [address], [place], [place] [address]  Differential     Status: Abnormal   Collection Time: 04/09/18  5:34 PM  Result Value Ref Range   Neutrophils Relative % 73 %   Neutro Abs 10.5 (H) 1.7 - 7.7 K/uL   Lymphocytes Relative 13 %   Lymphs Abs 1.8 0.7 - 4.0 K/uL   Monocytes Relative 7 %   Monocytes Absolute 0.9 0.1 - 1.0 K/uL   Eosinophils Relative 7 %   Eosinophils Absolute 1.1 (H) 0.0 - 0.7 K/uL   Basophils Relative 0 %   Basophils Absolute 0.0 0.0 - 0.1 K/uL    Comment: Performed at [facility], [address] [place], [place] [address]  Comprehensive metabolic panel     Status: Abnormal   Collection Time: 04/09/18  5:34 PM  Result Value Ref Range   Sodium 138 135 - 145 mmol/L   Potassium 3.3 (L) 3.5 - 5.1 mmol/L   Chloride 104 98 - 111 mmol/L   CO2 24 22 - 32 mmol/L   Glucose, Bld 201 (H) 70 - 99 mg/dL   BUN 8 8 - 23 mg/dL   Creatinine, Ser 0.95 0.44 - 1.00 mg/dL   Calcium 9.1 8.9 - 10.3 mg/dL   Total Protein 6.4 (L) 6.5 - 8.1 g/dL   Albumin 3.1 (L) 3.5 - 5.0 g/dL   AST 14 (L) 15 - 41 U/L    ALT 10 0 - 44 U/L   Alkaline Phosphatase 66 38 - 126 U/L   Total Bilirubin 0.4 0.3 - 1.2 mg/dL   GFR calc non Af Amer 56 (L) >60 mL/min   GFR calc Af Amer >60 >60 mL/min    Comment: (NOTE) The eGFR has been calculated using the CKD EPI equation. This calculation has not been validated in all clinical situations. eGFR's persistently <60 mL/min signify possible Chronic Kidney Disease.    Anion gap 10 5 - 15    Comment: Performed at [facility], [address], [place], [place] [address]  Troponin I     Status: None   Collection Time: 04/09/18  5:34 PM  Result Value Ref Range   Troponin I <0.03 <0.03 ng/mL    Comment: Performed at [facility], [address], [place], [place] [address]  Hemoglobin A1c     Status: Abnormal   Collection Time: 04/09/18  5:34 PM  Result Value Ref Range   Hgb A1c MFr Bld 9.4 (H) 4.8 - 5.6 %    Comment: (NOTE) Pre diabetes:          5.7%-6.4% Diabetes:              >6.4% Glycemic control for   <7.0% adults with diabetes    Mean Plasma Glucose 223.08 mg/dL    Comment: Performed at [facility] [address] [place], [place] [address]  Urine rapid drug screen (hosp performed)not at [facility]     Status: None   Collection Time: 04/10/18  4:00 AM  Result Value Ref Range  Opiates NONE DETECTED NONE DETECTED   Cocaine NONE DETECTED NONE DETECTED   Benzodiazepines NONE DETECTED NONE DETECTED   Amphetamines NONE DETECTED NONE DETECTED   Tetrahydrocannabinol NONE DETECTED NONE DETECTED   Barbiturates NONE DETECTED NONE DETECTED    Comment: (NOTE) DRUG SCREEN FOR MEDICAL PURPOSES ONLY.  IF CONFIRMATION IS NEEDED FOR ANY PURPOSE, NOTIFY LAB WITHIN 5 DAYS. LOWEST DETECTABLE LIMITS FOR URINE DRUG SCREEN Drug Class                     Cutoff (ng/mL) Amphetamine and metabolites    1000 Barbiturate and metabolites    200 Benzodiazepine                 003 Tricyclics and metabolites     300 Opiates and metabolites        300 Cocaine and metabolites         300 THC                            50 Performed at [facility], [address], [place], [place] [address]   Urinalysis, Routine w reflex microscopic     Status: Abnormal   Collection Time: 04/10/18  4:00 AM  Result Value Ref Range   Color, Urine YELLOW YELLOW   APPearance CLEAR CLEAR   Specific Gravity, Urine 1.029 1.005 - 1.030   pH 6.0 5.0 - 8.0   Glucose, UA >=500 (A) NEGATIVE mg/dL   Hgb urine dipstick NEGATIVE NEGATIVE   Bilirubin Urine NEGATIVE NEGATIVE   Ketones, ur NEGATIVE NEGATIVE mg/dL   Protein, ur NEGATIVE NEGATIVE mg/dL   Nitrite NEGATIVE NEGATIVE   Leukocytes, UA NEGATIVE NEGATIVE   RBC / HPF 0-5 0 - 5 RBC/hpf   WBC, UA 0-5 0 - 5 WBC/hpf   Bacteria, UA NONE SEEN NONE SEEN   Squamous Epithelial / LPF 0-5 0 - 5    Comment: Performed at [facility], [address], [place],  [address]  Lipid panel     Status: Abnormal   Collection Time: 04/10/18  4:57 AM  Result Value Ref Range   Cholesterol 88 0 - 200 mg/dL   Triglycerides 117 <150 mg/dL   HDL 28 (L) >40 mg/dL   Total CHOL/HDL Ratio 3.1 RATIO   VLDL 23 0 - 40 mg/dL   LDL Cholesterol 37 0 - 99 mg/dL    Comment:        Total Cholesterol/HDL:CHD Risk Coronary Heart Disease Risk Table                     Men   Women  1/2 Average Risk   3.4   3.3  Average Risk       5.0   4.4  2 X Average Risk   9.6   7.1  3 X Average Risk  23.4   11.0        Use the calculated Patient Ratio above and the CHD Risk Table to determine the patient's CHD Risk.        ATP III CLASSIFICATION (LDL):  <100     mg/dL   Optimal  100-129  mg/dL   Near or Above                    Optimal  130-159  mg/dL   Borderline  160-189  mg/dL   High  >190     mg/dL  Very High Performed at [facility], [address], [place], [place] [address]   Glucose, capillary     Status: Abnormal   Collection Time: 04/10/18  8:02 AM  Result Value Ref Range   Glucose-Capillary 170 (H) 70 - 99 mg/dL  Glucose, capillary     Status: Abnormal    Collection Time: 04/10/18 11:36 AM  Result Value Ref Range   Glucose-Capillary 154 (H) 70 - 99 mg/dL  Glucose, capillary     Status: Abnormal   Collection Time: 04/10/18  4:15 PM  Result Value Ref Range   Glucose-Capillary 182 (H) 70 - 99 mg/dL    Studies/Results:  ECHO - Left ventricle: The cavity size was normal. Wall thickness was   increased in a pattern of mild LVH. Systolic function was normal.   The estimated ejection fraction was in the range of 60% to 65%.   Wall motion was normal; there were no regional wall motion   abnormalities. Findings consistent with left ventricular   diastolic dysfunction, grade indeterminate. Doppler parameters   are consistent with high ventricular filling pressure. - Aortic valve: Moderately calcified annulus. Mildly calcified   leaflets. There may be a mild degree of calcific aortic stenosis   but leaflet excursion is difficult to appreciate on short axis   views. - Right ventricle: The cavity size was mildly dilated. Systolic   function was mildly reduced. - Right atrium: The atrium was mildly dilated. - Systemic veins: The IVC is dilated with normal respiratory   variation. Estimated CVP 8 mmHg.    CTA HEAD NECK IMPRESSION: 1. No emergent large vessel occlusion or hemodynamically significant intracranial stenosis. 2. Irregular fibrofatty plaque within the distal left common carotid artery and proximal left internal carotid artery, without hemodynamically significant stenosis. This appearance may be associated with an increased risk of plaque rupture. 3. Incompletely visualized right upper lobe mass. 4.  Aortic Atherosclerosis (ICD10-I70.0).    The brain MRI is reviewed in person.  No acute infarcts are seen on DWI.  There is marked global atrophy.  There is mild to confluent white matter signal seen on FLAIR imaging.  There is a large area of signal involving FLAIR sequence of the left frontal lobe and is associated with  encephalomalacia on T1.  This is consistent with a remote large vessel cortical infarct.  Similarly there is a smaller area involving the right basal ganglia.  Suspicious for remote lacunar infarct.   Shayli Altemose A. Merlene Laughter, M.D.  Diplomate, Tax adviser of Psychiatry and Neurology ( Neurology). 04/10/2018, 6:07 PM

## 2018-04-14 NOTE — Telephone Encounter (Signed)
Monitor placed in [employer].

## 2018-04-15 ENCOUNTER — Telehealth: Payer: Self-pay

## 2018-04-15 NOTE — Telephone Encounter (Signed)
I contacted patient for a scheduled hospital follow up. I have reconciled the current medication list. Patient stated she was told to take aspirin 81 instead of 325 mg. Patient was instructed to continue her current medications as she was taking prior to admittance. Patient was also instructed to start a low sodium diet and increase activity slowly. Patient is knowledgeable of her condition and all required medications she should be taking. Patient cares for herself at home but her daughter [name] lives in the home and help out. [name] is also aware of patient condition and treatment as she was at the hospital with patient. Patient stated she feels a lot better and has an appointment scheduled for 04/17/2018.

## 2018-04-17 ENCOUNTER — Encounter: Payer: Self-pay | Admitting: Family Medicine

## 2018-04-17 ENCOUNTER — Ambulatory Visit (INDEPENDENT_AMBULATORY_CARE_PROVIDER_SITE_OTHER): Payer: Medicare Other | Admitting: Family Medicine

## 2018-04-17 VITALS — BP 162/88 | HR 89 | Temp 97.9°F | Wt 137.8 lb

## 2018-04-17 DIAGNOSIS — C3490 Malignant neoplasm of unspecified part of unspecified bronchus or lung: Secondary | ICD-10-CM

## 2018-04-17 DIAGNOSIS — G459 Transient cerebral ischemic attack, unspecified: Secondary | ICD-10-CM

## 2018-04-17 DIAGNOSIS — E1169 Type 2 diabetes mellitus with other specified complication: Secondary | ICD-10-CM | POA: Diagnosis not present

## 2018-04-17 DIAGNOSIS — Z23 Encounter for immunization: Secondary | ICD-10-CM

## 2018-04-17 DIAGNOSIS — E785 Hyperlipidemia, unspecified: Secondary | ICD-10-CM | POA: Diagnosis not present

## 2018-04-17 LAB — COMPREHENSIVE METABOLIC PANEL
ALT: 6 IU/L (ref 0–32)
AST: 8 IU/L (ref 0–40)
Albumin/Globulin Ratio: 1.6 (ref 1.2–2.2)
Albumin: 3.9 g/dL (ref 3.5–4.8)
Alkaline Phosphatase: 82 IU/L (ref 39–117)
BUN/Creatinine Ratio: 13 (ref 12–28)
BUN: 10 mg/dL (ref 8–27)
Bilirubin Total: 0.3 mg/dL (ref 0.0–1.2)
CALCIUM: 9.4 mg/dL (ref 8.7–10.3)
CO2: 23 mmol/L (ref 20–29)
CREATININE: 0.8 mg/dL (ref 0.57–1.00)
Chloride: 101 mmol/L (ref 96–106)
GFR calc Af Amer: 82 mL/min/{1.73_m2} (ref >59)
GFR, EST NON AFRICAN AMERICAN: 71 mL/min/{1.73_m2} (ref >59)
Globulin, Total: 2.4 g/dL (ref 1.5–4.5)
Glucose: 179 mg/dL — ABNORMAL HIGH (ref 65–99)
Potassium: 5 mmol/L (ref 3.5–5.2)
Sodium: 141 mmol/L (ref 134–144)
Total Protein: 6.3 g/dL (ref 6.0–8.5)

## 2018-04-17 LAB — CBC WITH DIFFERENTIAL/PLATELET
BASOS ABS: 0 10*3/uL (ref 0.0–0.2)
Basos: 0 %
EOS (ABSOLUTE): 1.1 10*3/uL — ABNORMAL HIGH (ref 0.0–0.4)
Eos: 9 %
Hematocrit: 31.6 % — ABNORMAL LOW (ref 34.0–46.6)
Hemoglobin: 10.2 g/dL — ABNORMAL LOW (ref 11.1–15.9)
IMMATURE GRANULOCYTES: 0 %
Immature Grans (Abs): 0 10*3/uL (ref 0.0–0.1)
LYMPHS ABS: 1.8 10*3/uL (ref 0.7–3.1)
Lymphs: 15 %
MCH: 29.1 pg (ref 26.6–33.0)
MCHC: 32.3 g/dL (ref 31.5–35.7)
MCV: 90 fL (ref 79–97)
MONOS ABS: 0.7 10*3/uL (ref 0.1–0.9)
Monocytes: 6 %
NEUTROS PCT: 70 %
Neutrophils Absolute: 8.7 10*3/uL — ABNORMAL HIGH (ref 1.4–7.0)
PLATELETS: 731 10*3/uL — AB (ref 150–450)
RBC: 3.51 x10E6/uL — AB (ref 3.77–5.28)
RDW: 17.1 % — AB (ref 12.3–15.4)
WBC: 12.4 10*3/uL — AB (ref 3.4–10.8)

## 2018-04-17 NOTE — Progress Notes (Signed)
   Subjective:    Patient ID: [name], female    DOB: [birth year], 77 y.o.   MRN: [id number]  HPI She is here for follow-up visit after recent hospitalization for TIA.  She has had 2 TIAs and presently is taking Plavix as well as aspirin.  Since leaving the hospital in late September, she has had no more episodes of weakness.  Review of the record did show slightly elevated white blood count and potassium was slightly low.  She is scheduled to be followed up by oncology as well as neurology.  She does continue to smoke stating that a pack will last her 3 days.  She states she is putting forth an effort.  She did note that while she was in the hospital, her blood sugars did become quite elevated.   Review of Systems     Objective:   Physical Exam Alert and in no distress. Tympanic membranes appear normal however the left canal is narrow. Pharyngeal area is normal. Neck is supple without adenopathy or thyromegaly. Cardiac exam shows a regular sinus rhythm a 2/6 SEM, no gallops. Lungs are clear to auscultation.        Assessment & Plan:  TIA (transient ischemic attack)  DM type 2 with diabetic dyslipidemia ([place])  Malignant neoplasm of lung, unspecified laterality, unspecified part of lung ([place]) Encouraged her to continue on the aspirin and Plavix.  She will follow-up with neurology and oncology. Discussed the diabetes with her and explained that we need to get this under better control.  She will recheck that in several months.  Explained we might need to add another medication to her regimen.  Over 25 minutes, greater than 50% spent in counseling and coordination of care

## 2018-04-18 ENCOUNTER — Inpatient Hospital Stay (INDEPENDENT_AMBULATORY_CARE_PROVIDER_SITE_OTHER): Payer: Medicare Other

## 2018-04-18 DIAGNOSIS — I499 Cardiac arrhythmia, unspecified: Secondary | ICD-10-CM | POA: Diagnosis not present

## 2018-04-18 DIAGNOSIS — G459 Transient cerebral ischemic attack, unspecified: Secondary | ICD-10-CM | POA: Diagnosis not present

## 2018-04-19 ENCOUNTER — Emergency Department (HOSPITAL_COMMUNITY): Payer: Medicare Other

## 2018-04-19 ENCOUNTER — Encounter (HOSPITAL_COMMUNITY): Payer: Self-pay | Admitting: Emergency Medicine

## 2018-04-19 ENCOUNTER — Inpatient Hospital Stay (HOSPITAL_COMMUNITY)
Admission: EM | Admit: 2018-04-19 | Discharge: 2018-04-21 | DRG: 101 | Disposition: A | Discharge status: Home Health | Payer: Medicare Other | Attending: Family Medicine | Admitting: Family Medicine

## 2018-04-19 ENCOUNTER — Other Ambulatory Visit: Payer: Self-pay

## 2018-04-19 DIAGNOSIS — Z923 Personal history of irradiation: Secondary | ICD-10-CM | POA: Diagnosis not present

## 2018-04-19 DIAGNOSIS — G9389 Other specified disorders of brain: Secondary | ICD-10-CM | POA: Diagnosis not present

## 2018-04-19 DIAGNOSIS — R200 Anesthesia of skin: Secondary | ICD-10-CM | POA: Diagnosis not present

## 2018-04-19 DIAGNOSIS — R569 Unspecified convulsions: Secondary | ICD-10-CM

## 2018-04-19 DIAGNOSIS — I10 Essential (primary) hypertension: Secondary | ICD-10-CM | POA: Diagnosis not present

## 2018-04-19 DIAGNOSIS — E1140 Type 2 diabetes mellitus with diabetic neuropathy, unspecified: Secondary | ICD-10-CM | POA: Diagnosis present

## 2018-04-19 DIAGNOSIS — E785 Hyperlipidemia, unspecified: Secondary | ICD-10-CM | POA: Diagnosis not present

## 2018-04-19 DIAGNOSIS — Z79899 Other long term (current) drug therapy: Secondary | ICD-10-CM | POA: Diagnosis not present

## 2018-04-19 DIAGNOSIS — Z7982 Long term (current) use of aspirin: Secondary | ICD-10-CM

## 2018-04-19 DIAGNOSIS — F17210 Nicotine dependence, cigarettes, uncomplicated: Secondary | ICD-10-CM | POA: Diagnosis present

## 2018-04-19 DIAGNOSIS — D649 Anemia, unspecified: Secondary | ICD-10-CM | POA: Diagnosis not present

## 2018-04-19 DIAGNOSIS — Z7902 Long term (current) use of antithrombotics/antiplatelets: Secondary | ICD-10-CM | POA: Diagnosis not present

## 2018-04-19 DIAGNOSIS — E1169 Type 2 diabetes mellitus with other specified complication: Secondary | ICD-10-CM | POA: Diagnosis present

## 2018-04-19 DIAGNOSIS — D473 Essential (hemorrhagic) thrombocythemia: Secondary | ICD-10-CM | POA: Diagnosis present

## 2018-04-19 DIAGNOSIS — G459 Transient cerebral ischemic attack, unspecified: Secondary | ICD-10-CM | POA: Diagnosis not present

## 2018-04-19 DIAGNOSIS — R29810 Facial weakness: Secondary | ICD-10-CM | POA: Diagnosis present

## 2018-04-19 DIAGNOSIS — Z8673 Personal history of transient ischemic attack (TIA), and cerebral infarction without residual deficits: Secondary | ICD-10-CM | POA: Diagnosis not present

## 2018-04-19 DIAGNOSIS — R29700 NIHSS score 0: Secondary | ICD-10-CM | POA: Diagnosis not present

## 2018-04-19 DIAGNOSIS — Z833 Family history of diabetes mellitus: Secondary | ICD-10-CM | POA: Diagnosis not present

## 2018-04-19 DIAGNOSIS — D72829 Elevated white blood cell count, unspecified: Secondary | ICD-10-CM | POA: Diagnosis present

## 2018-04-19 DIAGNOSIS — R202 Paresthesia of skin: Secondary | ICD-10-CM | POA: Diagnosis not present

## 2018-04-19 DIAGNOSIS — Z85118 Personal history of other malignant neoplasm of bronchus and lung: Secondary | ICD-10-CM | POA: Diagnosis not present

## 2018-04-19 DIAGNOSIS — I152 Hypertension secondary to endocrine disorders: Secondary | ICD-10-CM | POA: Diagnosis present

## 2018-04-19 DIAGNOSIS — R4781 Slurred speech: Secondary | ICD-10-CM | POA: Diagnosis present

## 2018-04-19 DIAGNOSIS — E1165 Type 2 diabetes mellitus with hyperglycemia: Secondary | ICD-10-CM | POA: Diagnosis present

## 2018-04-19 DIAGNOSIS — E876 Hypokalemia: Secondary | ICD-10-CM | POA: Diagnosis not present

## 2018-04-19 DIAGNOSIS — G4089 Other seizures: Secondary | ICD-10-CM | POA: Diagnosis not present

## 2018-04-19 DIAGNOSIS — R0689 Other abnormalities of breathing: Secondary | ICD-10-CM | POA: Diagnosis not present

## 2018-04-19 DIAGNOSIS — Z7984 Long term (current) use of oral hypoglycemic drugs: Secondary | ICD-10-CM

## 2018-04-19 DIAGNOSIS — Z9049 Acquired absence of other specified parts of digestive tract: Secondary | ICD-10-CM | POA: Diagnosis not present

## 2018-04-19 DIAGNOSIS — E1159 Type 2 diabetes mellitus with other circulatory complications: Secondary | ICD-10-CM | POA: Diagnosis present

## 2018-04-19 DIAGNOSIS — C3491 Malignant neoplasm of unspecified part of right bronchus or lung: Secondary | ICD-10-CM | POA: Diagnosis not present

## 2018-04-19 DIAGNOSIS — G4489 Other headache syndrome: Secondary | ICD-10-CM | POA: Diagnosis not present

## 2018-04-19 HISTORY — DX: Unspecified convulsions: R56.9

## 2018-04-19 LAB — URINALYSIS, ROUTINE W REFLEX MICROSCOPIC
BACTERIA UA: NONE SEEN
BILIRUBIN URINE: NEGATIVE
Glucose, UA: 150 mg/dL — AB
Hgb urine dipstick: NEGATIVE
KETONES UR: NEGATIVE mg/dL
Nitrite: NEGATIVE
Protein, ur: NEGATIVE mg/dL
Specific Gravity, Urine: 1.02 (ref 1.005–1.030)
pH: 5 (ref 5.0–8.0)

## 2018-04-19 LAB — COMPREHENSIVE METABOLIC PANEL
ALK PHOS: 61 U/L (ref 38–126)
ALT: 8 U/L (ref 0–44)
AST: 11 U/L — AB (ref 15–41)
Albumin: 3.2 g/dL — ABNORMAL LOW (ref 3.5–5.0)
Anion gap: 10 (ref 5–15)
BUN: 15 mg/dL (ref 8–23)
CALCIUM: 8.6 mg/dL — AB (ref 8.9–10.3)
CO2: 26 mmol/L (ref 22–32)
Chloride: 104 mmol/L (ref 98–111)
Creatinine, Ser: 0.84 mg/dL (ref 0.44–1.00)
GFR calc Af Amer: >60 mL/min (ref >60)
Glucose, Bld: 172 mg/dL — ABNORMAL HIGH (ref 70–99)
POTASSIUM: 3.3 mmol/L — AB (ref 3.5–5.1)
Sodium: 140 mmol/L (ref 135–145)
Total Bilirubin: 0.5 mg/dL (ref 0.3–1.2)
Total Protein: 6.5 g/dL (ref 6.5–8.1)

## 2018-04-19 LAB — DIFFERENTIAL
BASOS ABS: 0 10*3/uL (ref 0.0–0.1)
Basophils Relative: 0 %
EOS ABS: 1.1 10*3/uL — AB (ref 0.0–0.7)
EOS PCT: 9 %
LYMPHS ABS: 1.6 10*3/uL (ref 0.7–4.0)
Lymphocytes Relative: 13 %
Monocytes Absolute: 0.8 10*3/uL (ref 0.1–1.0)
Monocytes Relative: 6 %
Neutro Abs: 9.4 10*3/uL — ABNORMAL HIGH (ref 1.7–7.7)
Neutrophils Relative %: 72 %

## 2018-04-19 LAB — CBC
HEMATOCRIT: 30.8 % — AB (ref 36.0–46.0)
HEMOGLOBIN: 9.6 g/dL — AB (ref 12.0–15.0)
MCH: 28.3 pg (ref 26.0–34.0)
MCHC: 31.2 g/dL (ref 30.0–36.0)
MCV: 90.9 fL (ref 78.0–100.0)
Platelets: 669 10*3/uL — ABNORMAL HIGH (ref 150–400)
RBC: 3.39 MIL/uL — ABNORMAL LOW (ref 3.87–5.11)
RDW: 16.6 % — ABNORMAL HIGH (ref 11.5–15.5)
WBC: 13 10*3/uL — ABNORMAL HIGH (ref 4.0–10.5)

## 2018-04-19 LAB — RAPID URINE DRUG SCREEN, HOSP PERFORMED
Amphetamines: NOT DETECTED
Barbiturates: NOT DETECTED
Benzodiazepines: NOT DETECTED
COCAINE: NOT DETECTED
OPIATES: NOT DETECTED
Tetrahydrocannabinol: NOT DETECTED

## 2018-04-19 LAB — TROPONIN I

## 2018-04-19 LAB — MAGNESIUM: Magnesium: 1.1 mg/dL — ABNORMAL LOW (ref 1.7–2.4)

## 2018-04-19 LAB — PROTIME-INR
INR: 1.02
Prothrombin Time: 13.3 seconds (ref 11.4–15.2)

## 2018-04-19 LAB — APTT: aPTT: 28 seconds (ref 24–36)

## 2018-04-19 LAB — ETHANOL: Alcohol, Ethyl (B): <10 mg/dL (ref <10)

## 2018-04-19 MED ORDER — DM-GUAIFENESIN ER 30-600 MG PO TB12
1.0000 | ORAL_TABLET | Freq: Two times a day (BID) | ORAL | Status: DC | PRN
  Administered 2018-04-20: 1 via ORAL
  Filled 2018-04-19 (×2): qty 1

## 2018-04-19 MED ORDER — MAGNESIUM SULFATE 2 GM/50ML IV SOLN
2.0000 g | Freq: Once | INTRAVENOUS | Status: AC
  Administered 2018-04-20: 2 g via INTRAVENOUS
  Filled 2018-04-19: qty 50

## 2018-04-19 MED ORDER — POTASSIUM CHLORIDE CRYS ER 20 MEQ PO TBCR
40.0000 meq | EXTENDED_RELEASE_TABLET | Freq: Once | ORAL | Status: AC
  Administered 2018-04-19: 40 meq via ORAL
  Filled 2018-04-19: qty 2

## 2018-04-19 MED ORDER — ASPIRIN 81 MG PO CHEW
81.0000 mg | CHEWABLE_TABLET | Freq: Every day | ORAL | Status: DC
  Administered 2018-04-20 – 2018-04-21 (×2): 81 mg via ORAL
  Filled 2018-04-19 (×3): qty 1

## 2018-04-19 MED ORDER — SODIUM CHLORIDE 0.9 % IV SOLN
INTRAVENOUS | Status: DC | PRN
  Administered 2018-04-20: 250 mL via INTRAVENOUS

## 2018-04-19 MED ORDER — MAGNESIUM OXIDE 400 (241.3 MG) MG PO TABS
400.0000 mg | ORAL_TABLET | Freq: Once | ORAL | Status: AC
  Administered 2018-04-19: 400 mg via ORAL
  Filled 2018-04-19: qty 1

## 2018-04-19 MED ORDER — ASPIRIN 81 MG PO CHEW
324.0000 mg | CHEWABLE_TABLET | Freq: Once | ORAL | Status: AC
  Administered 2018-04-19: 324 mg via ORAL
  Filled 2018-04-19: qty 4

## 2018-04-19 NOTE — ED Notes (Signed)
teleneuro to call back [phone]

## 2018-04-19 NOTE — H&P (Signed)
History and Physical    DAJIAH KOOI NMM:[id number] DOB: 08/24/1940 DOA: 04/19/2018  PCP: [name], MD   Patient coming from: Home   Chief Complaint: Right hand numbness, left facial droop, blurry vision, slurred speech  HPI: [name] is a 77 y.o. female with medical history significant for squamous cells lung cancer, HTN, recent TIAs like symptoms, CVA, who presented to the Ed with complaints of feeling like her right hand had gone to sleep, left facial droop, and then slurred speech, lasting ~15- 20mns, all witnessed by her daughter who resides with her.   Recent hospitalization for similar symptoms but all on the left, and ED visit also with similar symptoms.  Patient has been doing well since discharge until today.  30-day Holter monitor was arranged prior to discharge, patient received Holter monitor yesterday and this was placed.  Patient denies palpitations fever dysuria shortness of breath, she has a chronic unchanged cough.  ED Course: Stable vitals.  K low 3.3.  Hemoglobin chronically low 9.6, elevated platelets 669, leukocytosis 13.  Head CT-stable left frontal encephalomalacia, mild chronic small vessel ischemic changes in the cerebral white matter.  EKG sinus rhythm right BBB.  Hospitalist called to admit for TIA/CVA work-up.  Review of Systems: As per HPI all other systems reviewed and negative  Past Medical History:  Diagnosis Date  . Carotid artery occlusion   . Clostridium difficile infection 06/2013  . Diabetes mellitus    takes Janumet daily  . Dyslipidemia    takes Crestor daily  . Gallstones   . GERD (gastroesophageal reflux disease)   . Heart murmur    hx of  . History of radiation therapy 06/11/17-06/21/17   right lung 50 Gy in 5 fractions  . Hypertension    takes Prinzide and Verapamil daily  . Impaired speech    from stroke  . Neuropathy, diabetic ([facility]   . Pneumonia   . PONV (postoperative nausea and vomiting)   . Smoker   . Stroke  [place] 06/[place]/13  . Vertigo    but doesn't take any meds    Past Surgical History:  Procedure Laterality Date  . cataract removed Right   . CHOLECYSTECTOMY    . ENDARTERECTOMY Left 07/14/2013   Procedure: ENDARTERECTOMY CAROTID-LEFT;  Surgeon: CElam Dutch MD;  [facility]  Service: Vascular;  Laterality: Left;  . IR PERC PLEURAL DRAIN W/INDWELL CATH W/IMG GUIDE  05/23/2017  . KNEE SURGERY Left [place]  . LAPAROSCOPIC PARTIAL COLECTOMY N/A 09/02/2015   Procedure: LAPAROSCOPIC PARTIAL RIGHT COLECTOMY;  Surgeon: AlLeighton RuffMD;  Location: [facility];  Service: General;  Laterality: N/A;  . PATCH ANGIOPLASTY Left 07/14/2013   Procedure: LEFT CAROTID ARTERY PATCH ANGIOPLASTY;  Surgeon: ChElam DutchMD;  [facility] Service: V[facility];  Laterality: Left;     reports that she has been smoking cigarettes. She has a 30.00 pack-year smoking history. She has never used smokeless tobacco. She reports that she does not drink alcohol or use drugs.  Allergies  Allergen Reactions  . Codeine Nausea And Vomiting  . Lipitor [Atorvastatin] Nausea And Vomiting  . Phenergan [Promethazine Hcl] Other (See Comments)    Confusion, hallucinations, severe agitation    Family History  Problem Relation Age of Onset  . Leukemia Mother   . Diabetes Mother   . Cancer Father        esophageal ca  . Heart disease Father   . Diabetes Maternal Aunt   .  Diabetes Maternal Uncle   . Diabetes Brother   . Diabetes Sister   . Heart attack Brother        x 2    Prior to Admission medications   Medication Sig Start Date End Date Taking? Authorizing Provider  acetaminophen (TYLENOL) 325 MG tablet Take 325 mg by mouth every 6 (six) hours as needed for mild pain.    Yes [provider]  aspirin EC 81 MG tablet Take 81 mg by mouth daily.   Yes [provider]  clopidogrel (PLAVIX) 75 MG tablet TAKE 1 TABLET BY MOUTH  DAILY Patient taking differently: Take 75 mg by mouth daily.   12/12/17  Yes [name], MD  Multiple Vitamin (MULTIVITAMIN WITH MINERALS) TABS tablet Take 1 tablet by mouth daily. 03/03/18  Yes Hosie Poisson, MD  rosuvastatin (CRESTOR) 20 MG tablet Take 1 tablet (20 mg total) by mouth daily. Patient taking differently: Take 20 mg by mouth at bedtime.  02/25/18  Yes [name], MD  sitaGLIPtin-metformin (JANUMET) 50-1000 MG tablet Take 1 tablet by mouth 2 (two) times daily. Patient taking differently: Take 1 tablet by mouth 2 (two) times daily before a meal.  11/13/17  Yes [name], MD  aspirin EC 325 MG tablet Take 1 tablet (325 mg total) by mouth daily. Patient taking differently: Take 81 mg by mouth daily.  04/10/18 04/10/19  Manuella Ghazi, Pratik D, DO  Blood Glucose Monitoring Suppl (ONE TOUCH ULTRA 2) w/Device KIT 1 Device by Does not apply route 2 (two) times daily. 08/21/17   [name], MD  feeding supplement, GLUCERNA SHAKE, (GLUCERNA SHAKE) LIQD Take 237 mLs by mouth 3 (three) times daily between meals. Patient not taking: Reported on 03/07/2018 03/02/18   Hosie Poisson, MD  glucose blood (ONE TOUCH ULTRA TEST) test strip Use as instructed 02/10/18   [name], MD  guaiFENesin (MUCINEX) 600 MG 12 hr tablet Take 1 tablet (600 mg total) by mouth 2 (two) times daily. Patient not taking: Reported on 03/30/2018 03/02/18   Hosie Poisson, MD  Lancets [facility] ULTRASOFT) lancets Use as instructed 08/21/17   [name], MD  magnesium oxide (MAG-OX) 400 (241.3 Mg) MG tablet Take 1 tablet (400 mg total) by mouth 2 (two) times daily. Patient not taking: Reported on 03/30/2018 03/02/18   Hosie Poisson, MD  megestrol (MEGACE) 400 MG/10ML suspension Take 10 mLs (400 mg total) by mouth 2 (two) times daily. Patient not taking: Reported on 04/17/2018 03/02/18   Hosie Poisson, MD  potassium & sodium phosphates (PHOS-NAK) 280-160-250 MG PACK Take 1 packet by mouth 4 (four) times daily -  with meals and at bedtime. Patient not taking: Reported on 03/10/2018  03/02/18   Hosie Poisson, MD    Physical Exam: Vitals:   04/19/18 1600 04/19/18 1615 04/19/18 1630 04/19/18 1900  BP:    (!) 162/68  Pulse: 83 83 87 79  Resp: (!) 29 (!) 38 15 18  Temp:      TempSrc:      SpO2: 99% 95% 97% 97%  Weight:      Height:        Constitutional: NAD, calm, comfortable Vitals:   04/19/18 1600 04/19/18 1615 04/19/18 1630 04/19/18 1900  BP:    (!) 162/68  Pulse: 83 83 87 79  Resp: (!) 29 (!) 38 15 18  Temp:      TempSrc:      SpO2: 99% 95% 97% 97%  Weight:  Height:       Eyes: PERRL, lids and conjunctivae normal ENMT: Mucous membranes are moist. Posterior pharynx clear of any exudate or lesions. Neck: normal, supple, no masses, no thyromegaly Respiratory: clear to auscultation bilaterally, no wheezing, no crackles. Normal respiratory effort. No accessory muscle use.  Cardiovascular: Regular rate and rhythm, no murmurs / rubs / gallops. No extremity edema. 2+ pedal pulses. No carotid bruits. Holter monitor present on chest. Abdomen: no tenderness, no masses palpated. No hepatosplenomegaly. Bowel sounds positive.  Musculoskeletal: no clubbing / cyanosis. Good ROM, no contractures. Normal muscle tone.  Skin: no rashes, lesions, ulcers. No induration Neurologic: CN 2-12 grossly intact. Sensation intact, DTR normal. Strength 5/5 in all 4.  Psychiatric: Normal judgment and insight. Alert and oriented x 3. Normal mood.   Labs on Admission: I have personally reviewed following labs and imaging studies  CBC: Recent Labs  Lab 04/17/18 0941 04/19/18 1541  WBC 12.4* 13.0*  NEUTROABS 8.7* 9.4*  HGB 10.2* 9.6*  HCT 31.6* 30.8*  MCV 90 90.9  PLT 731* 680*   Basic Metabolic Panel: Recent Labs  Lab 04/17/18 0941 04/19/18 1541  NA 141 140  K 5.0 3.3*  CL 101 104  CO2 23 26  GLUCOSE 179* 172*  BUN 10 15  CREATININE 0.80 0.84  CALCIUM 9.4 8.6*   Liver Function Tests: Recent Labs  Lab 04/17/18 0941 04/19/18 1541  AST 8 11*  ALT 6 8    ALKPHOS 82 61  BILITOT 0.3 0.5  PROT 6.3 6.5  ALBUMIN 3.9 3.2*   Coagulation Profile: Recent Labs  Lab 04/19/18 1541  INR 1.02   Cardiac Enzymes: Recent Labs  Lab 04/19/18 1541  TROPONINI <0.03     Radiological Exams on Admission: Ct Head Wo Contrast  Result Date: 04/19/2018 CLINICAL DATA:  TIA. Right-sided weakness today. No reported injury. EXAM: CT HEAD WITHOUT CONTRAST TECHNIQUE: Contiguous axial images were obtained from the base of the skull through the vertex without intravenous contrast. COMPARISON:  04/09/2018 head CT. FINDINGS: Brain: Stable left frontal encephalomalacia. No evidence of parenchymal hemorrhage or extra-axial fluid collection. No mass lesion, mass effect, or midline shift. No CT evidence of acute infarction. Generalized cerebral volume loss. Nonspecific mild subcortical and periventricular white matter hypodensity, most in keeping with chronic small vessel ischemic change. Cerebral ventricle sizes are stable and concordant with the degree of cerebral volume loss. Vascular: No acute abnormality. Skull: No evidence of calvarial fracture. Sinuses/Orbits: The visualized paranasal sinuses are essentially clear. Other:  The mastoid air cells are unopacified. IMPRESSION: 1.  No evidence of acute intracranial abnormality. 2. Stable left frontal encephalomalacia. 3. Mild chronic small vessel ischemic changes in the cerebral white matter. Electronically Signed   By: Ilona Sorrel M.D.   On: 04/19/2018 16:36    EKG: Sinus rhythm.  QTC 469. RBBB.  Assessment/Plan Active Problems:   Hypertension associated with diabetes ([place])   DM type 2 with diabetic dyslipidemia ([facility])   Leukocytosis   History of CVA (cerebrovascular accident)   Non-small cell lung cancer ([place])   Slurred speech   Slurred speech- with right arm numbness left facial droop and right blurry vision, ~5 hrs ago, lasting ~15 mins. Exam without deficits. Recent hospital admission admission for same 9/25-  9/26 -left facial droop left eye blurry vision left arm numbness, and ED visit for similar 03/30/18-also with left hand and facial numbness.  Plans was for outpatient Holter monitoring- started 04/18/18. Neuro-tele evaluation-concern for seizures. -MRI brain without contrast -Echocardiogram  to rule out thromboembolism -Neurology consultation in a.m -EEG - Patient has a holter monitor present, placed yesterday 04/18/18. -04/09/18- Recent hemoglobin A1c-9.4, lipid panel-LDL 37, HDL 28, TG- 117, total chol 88 -Aspirin 3251 now, then 81 daily and continue home Plavix for now pending neurology recommendations  Uncontrolled DM2-glucose 172.  Recent hemoglobin A1c 9.4/20 5/19.  Home medications Janumet. -Sliding scale insulin  HTN-systolic 173V to 670L.  Not on antihypertensives currently. -Allow for permissive hypertension  Mild hypokalemia- 3.3. Chronic issue.  - Replete, BMP a.m - Check Mag-   Squamous cell lung cancer-follows with Dr. Sondra Come. S/p radiation therapy.  Has appointment follow-up with patient oncology 04/2018.  Chronic leukocytosis and anemia- wbc 13, Hgb 9.6, (baseline 9-10). Follows with hematology. - iron studies  Ongoing tobacco abuse  DVT prophylaxis: Lovenox Code Status: Full Family Communication: None at bedside Disposition Plan: Per rounding team Consults called: Neurology consult in a.m Admission status: Obs, tele   Bethena Roys MD [facility] Pager [phone] From 3PM-11PM.  Otherwise please contact night-coverage [email or web address] Password [email or web address]  04/19/2018, 7:17 PM

## 2018-04-19 NOTE — ED Notes (Signed)
Passed the [employer] screen

## 2018-04-19 NOTE — ED Triage Notes (Addendum)
Pt has been having numbness on and off to bi lateral arms.  Today approx 1415 pt had episode of numbness to RT arm.  Pt is stroke screen neg at this time. A&O x 4. CBG 158.

## 2018-04-19 NOTE — Consult Note (Signed)
[facility]  Date of Service: 04/19/2018  Note: this was a Stat consult and not a stroke alert.  [facility]  Impression: 1. Although recurrent identical TIAs are a possibility I think it more likely that these episodes are probably partial seizures noting that they are very similar to what she experienced in a stroke from for five years ago and they all last about the same length of time 15 to 20 minutes.  Not a tpa candidate due to: completely recovered from symptoms, outside of window of opportunity Does not meet LVO screening criteria (no aphasia, neglect, gaze deviation, dense hemiparesis, or visual field deficits on exam), therefore advanced imaging is not indicated.  Differential Diagnosis:  1. Cardioembolic stroke  2. Small vessel disease/lacune  3. Thromboembolic, artery-to-artery mechanism  4. Hypercoagulable state-related infarct  5. Transient ischemic attack  6. Thrombotic mechanism, large artery disease   Recommendations: continue antiplatelet if no obvious contraindication EEG in the morning  the anti-epilepsy medication Keppra 500 mg BID  tele monitoring Bedside swallow evaluation HOB less than 30 degrees IV Fluid hydration with NS Euglycemia avoid hyperthermia, PRN acetaminophen  dvt ppx Consider inpatient neurology consultation Discussed with ED MD  Please call with questions  -----------------------------------------------------------------------------------------  CC: recurrent facial droop and right hand numbness  History of Present Illness:  Patient is a 77 year old woman who had a stroke in 2014 that affected primarily her speech. She has had several episodes over the last week of numbness of the right hand, weakness of the right hand, possibly a droop on the left side of her face with the right side going up, and slurred speech. All of them last between 15 and 20 minutes and  recover completely. She has a history of hypertension her blood pressure is 150/67. She is diabetic. She has no history of heart disease. She described having an irregular heartbeat but the daughter was not familiar with atrial fibrillation. She is in sinus rhythm. She has hyperlipidemia. She is an active one half pack per day smoker.  Diagnostic: I reviewed her CT scan and there is an old area of left frontal encephalomalacia seen on prior MRI. There is no acute change.  Exam: Patient is in no apparent distress. Patient appears as stated age. No obvious acute respiratory or cardiac distress. Patient is well groomed and well-nourished.  Level of consciousness: alert, keenly responsive = 0 Level of consciousness: month and age: both questions correct = 0 Level of consciousness: commands: performs both tasks = 0 Horizontal EOMs: normal = 0 Visual fields: normal = 0 Test facial palsy: normal symmetry = 0 L arm motor drift: no drift for 10 seconds = 0 R arm motor drift: no drift for 10 seconds = 0 L leg motor drift: no drift for five seconds = 0 R leg motor drift: no drift five seconds = 0 Limb ataxia: (FNF/Heel-Shin): no ataxia = 0 Sensation: normal; no sensory loss = 0 Language/Aphasia: normal: no aphasia = 0 Dysarthria: normal = 0 Extinction/Inattention = 0   NIHSS score = 0  Medical Decision Making: - Extensive number of diagnosis or management options are considered above. - Extensive amount of complex data reviewed. - High risk of complication and/or morbidity or mortality are associated with differential diagnostic considerations above. - There may be Uncertain outcome and increased probability of prolonged functional impairment or high probability of severe prolonged functional impairment associated with some of these differential diagnosis.  Medical Data Reviewed: 1.Data reviewed  include clinical labs, radiology,Medical Tests; 2.Tests results discussed w/performing or  interpreting physician; 3.Obtaining/reviewing old medical records; 4.Obtaining case history from another source; 5.Independent review of image, tracing or specimen.  Patient was informed the Neurology Consult would happen via TeleHealth consult by way of interactive audio and video telecommunications and consented to receiving care in this manner.   Patient was informed the Neurology Consult would happen via TeleHealth consult by way of interactive audio and video telecommunications and consented to receiving care in this manner.

## 2018-04-19 NOTE — ED Provider Notes (Signed)
[facility] EMERGENCY DEPARTMENT Provider Note   CSN: [id number] Arrival date & time: 04/19/18  1457     History   Chief Complaint Chief Complaint  Patient presents with  . Numbness    HPI [name] is a 77 y.o. female.  HPI  77 year old female presents with hand numbness.  At around noon today she all of a sudden developed numbness and heaviness to her right hand.  Lasted about 20 minutes.  At the same time she also had left facial droop and some right-sided blurry vision.  Slight headache on the left side.  This resolved on its own but then around 2:00 it occurred again.  Similar symptoms.  She was unable to repeat after first responders because she was slurring her words.  This has again resolved and lasted about 20 minutes or so.  Similar to when she was diagnosed with a TIA last week.  Otherwise currently feels well.  Past Medical History:  Diagnosis Date  . Carotid artery occlusion   . Clostridium difficile infection 06/2013  . Diabetes mellitus    takes Janumet daily  . Dyslipidemia    takes Crestor daily  . Gallstones   . GERD (gastroesophageal reflux disease)   . Heart murmur    hx of  . History of radiation therapy 06/11/17-06/21/17   right lung 50 Gy in 5 fractions  . Hypertension    takes Prinzide and Verapamil daily  . Impaired speech    from stroke  . Neuropathy, diabetic ([place])   . Pneumonia   . PONV (postoperative nausea and vomiting)   . Smoker   . Stroke ([place]) 06/25/13  . Vertigo    but doesn't take any meds    Patient Active Problem List   Diagnosis Date Noted  . Slurred speech 04/19/2018  . Hypokalemia 04/10/2018  . TIA (transient ischemic attack) 04/09/2018  . Malnutrition of moderate degree 03/01/2018  . AKI (acute kidney injury) ([place]) 02/27/2018  . Malignant neoplasm of lung ([place]) 07/04/2017  . Non-small cell lung cancer ([place]) 05/28/2017  . Pneumothorax after biopsy 05/23/2017  . Migraine equivalent 04/15/2017  . Smoker 04/15/2017   . Lung mass 02/16/2017  . Transient cerebral ischemia 02/15/2017  . Hyperlipidemia associated with type 2 diabetes mellitus ([place]) 03/07/2016  . Adenomatous colon polyp s/p laparoscopic right colectomy 09/02/15 09/02/2015  . Hemispheric carotid artery syndrome 04/07/2015  . Cataract associated with type 2 diabetes mellitus ([place]) 03/08/2015  . S/P recent Left carotid endarterectomy 07/14/2013  . History of CVA (cerebrovascular accident) 06/27/2013  . Leukocytosis 11/17/2012  . Hypertension associated with diabetes ([place]) 01/04/2011  . DM type 2 with diabetic dyslipidemia ([place]) 01/04/2011  . Light smoker 01/04/2011    Past Surgical History:  Procedure Laterality Date  . cataract removed Right   . CHOLECYSTECTOMY    . ENDARTERECTOMY Left 07/14/2013   Procedure: ENDARTERECTOMY CAROTID-LEFT;  Surgeon: Elam Dutch, MD;  Location: [place];  Service: Vascular;  Laterality: Left;  . IR PERC PLEURAL DRAIN W/INDWELL CATH W/IMG GUIDE  05/23/2017  . KNEE SURGERY Left 30yr ago  . LAPAROSCOPIC PARTIAL COLECTOMY N/A 09/02/2015   Procedure: LAPAROSCOPIC PARTIAL RIGHT COLECTOMY;  Surgeon: ALeighton Ruff MD;  Location: [facility];  Service: General;  Laterality: N/A;  . PATCH ANGIOPLASTY Left 07/14/2013   Procedure: LEFT CAROTID ARTERY PATCH ANGIOPLASTY;  Surgeon: CElam Dutch MD;  [facility]  Service: Vascular;  Laterality: Left;     OB History   None  Home Medications    Prior to Admission medications   Medication Sig Start Date End Date Taking? Authorizing Provider  acetaminophen (TYLENOL) 325 MG tablet Take 325 mg by mouth every 6 (six) hours as needed for mild pain.    Yes [provider]  aspirin EC 81 MG tablet Take 81 mg by mouth daily.   Yes [provider]  clopidogrel (PLAVIX) 75 MG tablet TAKE 1 TABLET BY MOUTH  DAILY Patient taking differently: Take 75 mg by mouth daily.  12/12/17  Yes [name], MD  Multiple Vitamin (MULTIVITAMIN WITH MINERALS)  TABS tablet Take 1 tablet by mouth daily. 03/03/18  Yes Hosie Poisson, MD  rosuvastatin (CRESTOR) 20 MG tablet Take 1 tablet (20 mg total) by mouth daily. Patient taking differently: Take 20 mg by mouth at bedtime.  02/25/18  Yes [name], MD  sitaGLIPtin-metformin (JANUMET) 50-1000 MG tablet Take 1 tablet by mouth 2 (two) times daily. Patient taking differently: Take 1 tablet by mouth 2 (two) times daily before a meal.  11/13/17  Yes [name], MD  aspirin EC 325 MG tablet Take 1 tablet (325 mg total) by mouth daily. Patient taking differently: Take 81 mg by mouth daily.  04/10/18 04/10/19  Manuella Ghazi, Pratik D, DO  Blood Glucose Monitoring Suppl (ONE TOUCH ULTRA 2) w/Device KIT 1 Device by Does not apply route 2 (two) times daily. 08/21/17   [name], MD  feeding supplement, GLUCERNA SHAKE, (GLUCERNA SHAKE) LIQD Take 237 mLs by mouth 3 (three) times daily between meals. Patient not taking: Reported on 03/07/2018 03/02/18   Hosie Poisson, MD  glucose blood (ONE TOUCH ULTRA TEST) test strip Use as instructed 02/10/18   [name], MD  guaiFENesin (MUCINEX) 600 MG 12 hr tablet Take 1 tablet (600 mg total) by mouth 2 (two) times daily. Patient not taking: Reported on 03/30/2018 03/02/18   Hosie Poisson, MD  Lancets [facility] ULTRASOFT) lancets Use as instructed 08/21/17   [name], MD  magnesium oxide (MAG-OX) 400 (241.3 Mg) MG tablet Take 1 tablet (400 mg total) by mouth 2 (two) times daily. Patient not taking: Reported on 03/30/2018 03/02/18   Hosie Poisson, MD  megestrol (MEGACE) 400 MG/10ML suspension Take 10 mLs (400 mg total) by mouth 2 (two) times daily. Patient not taking: Reported on 04/17/2018 03/02/18   Hosie Poisson, MD  potassium & sodium phosphates (PHOS-NAK) 280-160-250 MG PACK Take 1 packet by mouth 4 (four) times daily -  with meals and at bedtime. Patient not taking: Reported on 03/10/2018 03/02/18   Hosie Poisson, MD    Family History Family History  Problem Relation Age  of Onset  . Leukemia Mother   . Diabetes Mother   . Cancer Father        esophageal ca  . Heart disease Father   . Diabetes Maternal Aunt   . Diabetes Maternal Uncle   . Diabetes Brother   . Diabetes Sister   . Heart attack Brother        x 2    Social History Social History   Tobacco Use  . Smoking status: Current Every Day Smoker    Packs/day: 0.50    Years: 60.00    Pack years: 30.00    Types: Cigarettes  . Smokeless tobacco: Never Used  Substance Use Topics  . Alcohol use: No    Alcohol/week: 0.0 standard drinks  . Drug use: No     Allergies   Codeine; Lipitor [  atorvastatin]; and Phenergan [promethazine hcl]   Review of Systems Review of Systems  Eyes: Positive for visual disturbance.  Neurological: Positive for speech difficulty, weakness, numbness and headaches.  All other systems reviewed and are negative.    Physical Exam Updated Vital Signs BP (!) 158/66   Pulse 77   Temp 97.6 F (36.4 C) (Oral)   Resp 20   Ht _0  (1.676 m)   Wt 62 kg   SpO2 97%   BMI 22.06 kg/m   Physical Exam  Constitutional: She is oriented to person, place, and time. She appears well-developed and well-nourished. No distress.  HENT:  Head: Normocephalic and atraumatic.  Right Ear: External ear normal.  Left Ear: External ear normal.  Nose: Nose normal.  Eyes: Pupils are equal, round, and reactive to light. EOM are normal. Right eye exhibits no discharge. Left eye exhibits no discharge.  Neck: Neck supple.  Cardiovascular: Normal rate, regular rhythm and normal heart sounds.  Pulmonary/Chest: Effort normal and breath sounds normal.  Abdominal: Soft. There is no tenderness.  Neurological: She is alert and oriented to person, place, and time.  CN 3-12 grossly intact. 5/5 strength in all 4 extremities. Grossly normal sensation. Normal finger to nose. Clear speech  Skin: Skin is warm and dry. She is not diaphoretic.  Nursing note and vitals reviewed.    ED  Treatments / Results  Labs (all labs ordered are listed, but only abnormal results are displayed) Labs Reviewed  CBC - Abnormal; Notable for the following components:      Result Value   WBC 13.0 (*)    RBC 3.39 (*)    Hemoglobin 9.6 (*)    HCT 30.8 (*)    RDW 16.6 (*)    Platelets 669 (*)    All other components within normal limits  DIFFERENTIAL - Abnormal; Notable for the following components:   Neutro Abs 9.4 (*)    Eosinophils Absolute 1.1 (*)    All other components within normal limits  COMPREHENSIVE METABOLIC PANEL - Abnormal; Notable for the following components:   Potassium 3.3 (*)    Glucose, Bld 172 (*)    Calcium 8.6 (*)    Albumin 3.2 (*)    AST 11 (*)    All other components within normal limits  URINALYSIS, ROUTINE W REFLEX MICROSCOPIC - Abnormal; Notable for the following components:   Glucose, UA 150 (*)    Leukocytes, UA TRACE (*)    All other components within normal limits  MAGNESIUM - Abnormal; Notable for the following components:   Magnesium 1.1 (*)    All other components within normal limits  ETHANOL  PROTIME-INR  APTT  RAPID URINE DRUG SCREEN, HOSP PERFORMED  TROPONIN I  BASIC METABOLIC PANEL    EKG EKG Interpretation  Date/Time:  Saturday April 19 2018 15:18:52 EDT Ventricular Rate:  86 PR Interval:    QRS Duration: 132 QT Interval:  392 QTC Calculation: 469 R Axis:   9 Text Interpretation:  Sinus rhythm Right bundle branch block no significant change since Sept 25 2019 Confirmed by Sherwood Gambler [phone]) on 04/19/2018 3:21:26 PM   Radiology Ct Head Wo Contrast  Result Date: 04/19/2018 CLINICAL DATA:  TIA. Right-sided weakness today. No reported injury. EXAM: CT HEAD WITHOUT CONTRAST TECHNIQUE: Contiguous axial images were obtained from the base of the skull through the vertex without intravenous contrast. COMPARISON:  04/09/2018 head CT. FINDINGS: Brain: Stable left frontal encephalomalacia. No evidence of parenchymal hemorrhage or  extra-axial fluid  collection. No mass lesion, mass effect, or midline shift. No CT evidence of acute infarction. Generalized cerebral volume loss. Nonspecific mild subcortical and periventricular white matter hypodensity, most in keeping with chronic small vessel ischemic change. Cerebral ventricle sizes are stable and concordant with the degree of cerebral volume loss. Vascular: No acute abnormality. Skull: No evidence of calvarial fracture. Sinuses/Orbits: The visualized paranasal sinuses are essentially clear. Other:  The mastoid air cells are unopacified. IMPRESSION: 1.  No evidence of acute intracranial abnormality. 2. Stable left frontal encephalomalacia. 3. Mild chronic small vessel ischemic changes in the cerebral white matter. Electronically Signed   By: Ilona Sorrel M.D.   On: 04/19/2018 16:36    Procedures Procedures (including critical care time)  Medications Ordered in ED Medications  aspirin chewable tablet 81 mg (0 mg Oral Hold 04/19/18 2000)  potassium chloride SA (K-DUR,KLOR-CON) CR tablet 40 mEq (40 mEq Oral Given 04/19/18 1648)  potassium chloride SA (K-DUR,KLOR-CON) CR tablet 40 mEq (40 mEq Oral Given 04/19/18 2103)  aspirin chewable tablet 324 mg (324 mg Oral Given 04/19/18 1958)     Initial Impression / Assessment and Plan / ED Course  I have reviewed the triage vital signs and the nursing notes.  Pertinent labs & imaging results that were available during my care of the patient were reviewed by me and considered in my medical decision making (see chart for details).     Patient is not having any symptoms right now.  Consider TIA versus seizure.  Work-up begun and shows hypokalemia but this is mild.  Later on found to have significant hypomagnesemia and this will be repleted.  CT head unremarkable.  I consulted with neurology, Dr. Lucia Gaskins, who feels this is probably seizure disorder rather than TIA.  He would recommend admission and EEG.  I have discussed with the hospitalist  who will admit and transfer to [facility] for these processes.  Patient remained stable.  Final Clinical Impressions(s) / ED Diagnoses   Final diagnoses:  Right arm numbness    ED Discharge Orders    None       Sherwood Gambler, MD 04/19/18 2141

## 2018-04-19 NOTE — ED Notes (Addendum)
Pt's left ear popped and now with ringing to ear, hx of ringing in ear many years ago  Pt denies any numbness at this time

## 2018-04-20 ENCOUNTER — Other Ambulatory Visit: Payer: Self-pay

## 2018-04-20 ENCOUNTER — Inpatient Hospital Stay (HOSPITAL_COMMUNITY): Payer: Medicare Other

## 2018-04-20 DIAGNOSIS — D649 Anemia, unspecified: Secondary | ICD-10-CM | POA: Diagnosis present

## 2018-04-20 DIAGNOSIS — C3491 Malignant neoplasm of unspecified part of right bronchus or lung: Secondary | ICD-10-CM | POA: Diagnosis not present

## 2018-04-20 DIAGNOSIS — R29700 NIHSS score 0: Secondary | ICD-10-CM | POA: Diagnosis present

## 2018-04-20 DIAGNOSIS — E1140 Type 2 diabetes mellitus with diabetic neuropathy, unspecified: Secondary | ICD-10-CM | POA: Diagnosis present

## 2018-04-20 DIAGNOSIS — R569 Unspecified convulsions: Secondary | ICD-10-CM | POA: Diagnosis not present

## 2018-04-20 DIAGNOSIS — Z79899 Other long term (current) drug therapy: Secondary | ICD-10-CM | POA: Diagnosis not present

## 2018-04-20 DIAGNOSIS — Z7902 Long term (current) use of antithrombotics/antiplatelets: Secondary | ICD-10-CM | POA: Diagnosis not present

## 2018-04-20 DIAGNOSIS — R29810 Facial weakness: Secondary | ICD-10-CM | POA: Diagnosis present

## 2018-04-20 DIAGNOSIS — G459 Transient cerebral ischemic attack, unspecified: Secondary | ICD-10-CM | POA: Diagnosis not present

## 2018-04-20 DIAGNOSIS — D72829 Elevated white blood cell count, unspecified: Secondary | ICD-10-CM | POA: Diagnosis not present

## 2018-04-20 DIAGNOSIS — G4089 Other seizures: Secondary | ICD-10-CM | POA: Diagnosis present

## 2018-04-20 DIAGNOSIS — E1165 Type 2 diabetes mellitus with hyperglycemia: Secondary | ICD-10-CM | POA: Diagnosis present

## 2018-04-20 DIAGNOSIS — I10 Essential (primary) hypertension: Secondary | ICD-10-CM | POA: Diagnosis not present

## 2018-04-20 DIAGNOSIS — E1169 Type 2 diabetes mellitus with other specified complication: Secondary | ICD-10-CM | POA: Diagnosis not present

## 2018-04-20 DIAGNOSIS — Z9049 Acquired absence of other specified parts of digestive tract: Secondary | ICD-10-CM | POA: Diagnosis not present

## 2018-04-20 DIAGNOSIS — Z85118 Personal history of other malignant neoplasm of bronchus and lung: Secondary | ICD-10-CM | POA: Diagnosis not present

## 2018-04-20 DIAGNOSIS — Z923 Personal history of irradiation: Secondary | ICD-10-CM | POA: Diagnosis not present

## 2018-04-20 DIAGNOSIS — F17210 Nicotine dependence, cigarettes, uncomplicated: Secondary | ICD-10-CM | POA: Diagnosis present

## 2018-04-20 DIAGNOSIS — Z7982 Long term (current) use of aspirin: Secondary | ICD-10-CM | POA: Diagnosis not present

## 2018-04-20 DIAGNOSIS — R200 Anesthesia of skin: Secondary | ICD-10-CM | POA: Diagnosis present

## 2018-04-20 DIAGNOSIS — E876 Hypokalemia: Secondary | ICD-10-CM | POA: Diagnosis present

## 2018-04-20 DIAGNOSIS — Z833 Family history of diabetes mellitus: Secondary | ICD-10-CM | POA: Diagnosis not present

## 2018-04-20 DIAGNOSIS — Z7984 Long term (current) use of oral hypoglycemic drugs: Secondary | ICD-10-CM | POA: Diagnosis not present

## 2018-04-20 DIAGNOSIS — D473 Essential (hemorrhagic) thrombocythemia: Secondary | ICD-10-CM | POA: Diagnosis not present

## 2018-04-20 DIAGNOSIS — Z8673 Personal history of transient ischemic attack (TIA), and cerebral infarction without residual deficits: Secondary | ICD-10-CM | POA: Diagnosis not present

## 2018-04-20 DIAGNOSIS — E785 Hyperlipidemia, unspecified: Secondary | ICD-10-CM | POA: Diagnosis not present

## 2018-04-20 DIAGNOSIS — R4781 Slurred speech: Secondary | ICD-10-CM | POA: Diagnosis not present

## 2018-04-20 LAB — IRON AND TIBC
Iron: 28 ug/dL (ref 28–170)
SATURATION RATIOS: 10 % — AB (ref 10.4–31.8)
TIBC: 267 ug/dL (ref 250–450)
UIBC: 239 ug/dL

## 2018-04-20 LAB — CBC
HCT: 29.2 % — ABNORMAL LOW (ref 36.0–46.0)
Hemoglobin: 9.3 g/dL — ABNORMAL LOW (ref 12.0–15.0)
MCH: 28.6 pg (ref 26.0–34.0)
MCHC: 31.8 g/dL (ref 30.0–36.0)
MCV: 89.8 fL (ref 78.0–100.0)
PLATELETS: 632 10*3/uL — AB (ref 150–400)
RBC: 3.25 MIL/uL — AB (ref 3.87–5.11)
RDW: 16.2 % — AB (ref 11.5–15.5)
WBC: 12.6 10*3/uL — AB (ref 4.0–10.5)

## 2018-04-20 LAB — FERRITIN: Ferritin: 82 ng/mL (ref 11–307)

## 2018-04-20 MED ORDER — SENNOSIDES-DOCUSATE SODIUM 8.6-50 MG PO TABS: 1.0000 | ORAL_TABLET | Freq: Every evening | ORAL | Status: DC | PRN

## 2018-04-20 MED ORDER — ACETAMINOPHEN 650 MG RE SUPP: 650.0000 mg | RECTAL | Status: DC | PRN

## 2018-04-20 MED ORDER — ACETAMINOPHEN 160 MG/5ML PO SOLN: 650.0000 mg | ORAL | Status: DC | PRN

## 2018-04-20 MED ORDER — MAGNESIUM OXIDE 400 (241.3 MG) MG PO TABS
400.0000 mg | ORAL_TABLET | Freq: Two times a day (BID) | ORAL | Status: DC
  Administered 2018-04-20 – 2018-04-21 (×2): 400 mg via ORAL
  Filled 2018-04-20 (×2): qty 1

## 2018-04-20 MED ORDER — LEVETIRACETAM IN NACL 1000 MG/100ML IV SOLN
1000.0000 mg | Freq: Once | INTRAVENOUS | Status: AC
  Administered 2018-04-20: 1000 mg via INTRAVENOUS
  Filled 2018-04-20: qty 100

## 2018-04-20 MED ORDER — ROSUVASTATIN CALCIUM 20 MG PO TABS
20.0000 mg | ORAL_TABLET | Freq: Every day | ORAL | Status: DC
  Administered 2018-04-20: 20 mg via ORAL
  Filled 2018-04-20: qty 1

## 2018-04-20 MED ORDER — ENOXAPARIN SODIUM 40 MG/0.4ML ~~LOC~~ SOLN: 40.0000 mg | SUBCUTANEOUS | Status: DC

## 2018-04-20 MED ORDER — LEVETIRACETAM 500 MG PO TABS
500.0000 mg | ORAL_TABLET | Freq: Two times a day (BID) | ORAL | Status: DC
  Administered 2018-04-20 – 2018-04-21 (×2): 500 mg via ORAL
  Filled 2018-04-20 (×2): qty 1

## 2018-04-20 MED ORDER — ENOXAPARIN SODIUM 30 MG/0.3ML ~~LOC~~ SOLN
30.0000 mg | SUBCUTANEOUS | Status: DC
  Administered 2018-04-20: 30 mg via SUBCUTANEOUS
  Filled 2018-04-20: qty 0.3

## 2018-04-20 MED ORDER — INSULIN ASPART 100 UNIT/ML ~~LOC~~ SOLN
0.0000 [IU] | Freq: Three times a day (TID) | SUBCUTANEOUS | Status: DC
  Administered 2018-04-21: 3 [IU] via SUBCUTANEOUS
  Administered 2018-04-21: 2 [IU] via SUBCUTANEOUS

## 2018-04-20 MED ORDER — MENTHOL 3 MG MT LOZG
1.0000 | LOZENGE | OROMUCOSAL | Status: DC | PRN
  Administered 2018-04-20: 3 mg via ORAL
  Filled 2018-04-20: qty 9

## 2018-04-20 MED ORDER — STROKE: EARLY STAGES OF RECOVERY BOOK
Freq: Once | Status: AC
  Administered 2018-04-20

## 2018-04-20 MED ORDER — ACETAMINOPHEN 325 MG PO TABS: 650.0000 mg | ORAL_TABLET | ORAL | Status: DC | PRN

## 2018-04-20 MED ORDER — CLOPIDOGREL BISULFATE 75 MG PO TABS
75.0000 mg | ORAL_TABLET | Freq: Every day | ORAL | Status: DC
  Administered 2018-04-21 (×2): 75 mg via ORAL
  Filled 2018-04-20: qty 1

## 2018-04-20 NOTE — Progress Notes (Signed)
  Patient has several episodes of sudden onset of numbness and heaviness in the right hand, as well as right facial droop.  Dr. Leonel Ramsay, neurology has evaluated the patient, suspecting TIA versus seizure.  Stroke work-up is pending, occluding MRI of the brain.  EEG has been ordered for possible partial seizure.  Keppra has been initiated by neurology.  Recommend frequent neuro checks, and stroke swallow screen.  Appreciate neurology involvement.

## 2018-04-20 NOTE — Progress Notes (Signed)
Code stroke called, complaints of hand numbness bilaterally, facial drooping right side. Symptoms clear quickly <5 min, total of 2 events, NIH 0 both times. MD doctor in suspects Seizure activity? Awaiting furthers orders

## 2018-04-20 NOTE — Progress Notes (Signed)
Spoke with Dr Lorraine Lax due to patient having on a Holter Monitor and was told that it was ok to remove for MRI

## 2018-04-20 NOTE — Progress Notes (Signed)
Patient shows brief episode of left arm drift and not able to hole arm up at all with out flopping over onto side of bed, patient follows all commands correctly, A&O x4, MD will Be provided with update

## 2018-04-20 NOTE — H&P (Signed)
History and Physical    [name] ASN:[id number] DOB: [birth year] DOA: 04/19/2018   PCP: [name], MD   Patient coming from:  Home    Chief Complaint: Crescendo TIA symptoms  HPI: [name] is a 77 y.o. female with medical history significant for squamous cell lung cancer, hypertension, tobacco abuse, diabetes, recent TIA symptoms, history of CVA, presenting to the emergency department with bilateral hand numbness, right facial droop, slurred speech, lasting between 15 and 20 minutes, all witnessed by her daughter, who lives with her.  Recent hospitalization with similar symptoms were noted, and a recent ED visit showed similar findings.  Upon the last discharge, the patient was placed on a 30-day Holter monitor, which was arranged prior to her discharge, placed on 04/18/2018.  She did not have any palpitations at the time.  And she denies any other complaints.  Her vital signs are stable, potassium was low at 3.3 which was replenished.  His chronic leukocytosis and chronic anemia.  She was noted to have platelet count 100 on admission, currently 669.  CT of the head was stable, showing frontal encephalomalacia, which was chronic, mild chronic small vessel ischemic changes in the cerebral white matter.  EKG sinus rhythm right BBB  Initially, symptom-free.  However after 1300 hrs., the patient begun to show crescendo TIA symptoms, with a frequency of about every 5 minutes.  This was witnessed by her family and by this Probation officer.  Code stroke was called.  Work-up is ongoing.  She is being admitted for further work-up, management of her symptoms.    Review of Systems:  As per HPI otherwise all other systems reviewed and are negative  Past Medical History:  Diagnosis Date  . Carotid artery occlusion   . Clostridium difficile infection 06/2013  . Diabetes mellitus    takes Janumet daily  . Dyslipidemia    takes Crestor daily  . Gallstones   . GERD (gastroesophageal reflux disease)    . Heart murmur    hx of  . History of radiation therapy 06/11/17-06/21/17   right lung 50 Gy in 5 fractions  . Hypertension    takes Prinzide and Verapamil daily  . Impaired speech    from stroke  . Neuropathy, diabetic ([place])   . Pneumonia   . PONV (postoperative nausea and vomiting)   . Smoker   . Stroke ([place]) 06/25/13  . Vertigo    but doesn't take any meds    Past Surgical History:  Procedure Laterality Date  . cataract removed Right   . CHOLECYSTECTOMY    . ENDARTERECTOMY Left 07/14/2013   Procedure: ENDARTERECTOMY CAROTID-LEFT;  Surgeon: Elam Dutch, MD;  Location: [place];  Service: Vascular;  Laterality: Left;  . IR PERC PLEURAL DRAIN W/INDWELL CATH W/IMG GUIDE  05/23/2017  . KNEE SURGERY Left 52yr ago  . LAPAROSCOPIC PARTIAL COLECTOMY N/A 09/02/2015   Procedure: LAPAROSCOPIC PARTIAL RIGHT COLECTOMY;  Surgeon: ALeighton Ruff MD;  Location: [facility];  Service: General;  Laterality: N/A;  . PATCH ANGIOPLASTY Left 07/14/2013   Procedure: LEFT CAROTID ARTERY PATCH ANGIOPLASTY;  Surgeon: CElam Dutch MD;  Location: [facility]  Service: Vascular;  Laterality: Left;    Social History Social History   Socioeconomic History  . Marital status: Married    Spouse name: jJeneen Rinks . Number of children: 3  . Years of education: 10  . Highest education level: Not on file  Occupational History  . Occupation: retired  SScientific laboratory technician .  Financial resource strain: Not on file  . Food insecurity:    Worry: Not on file    Inability: Not on file  . Transportation needs:    Medical: Not on file    Non-medical: Not on file  Tobacco Use  . Smoking status: Current Every Day Smoker    Packs/day: 0.50    Years: 60.00    Pack years: 30.00    Types: Cigarettes  . Smokeless tobacco: Never Used  Substance and Sexual Activity  . Alcohol use: No    Alcohol/week: 0.0 standard drinks  . Drug use: No  . Sexual activity: Yes  Lifestyle  . Physical activity:    Days per week: Not on file     Minutes per session: Not on file  . Stress: Not on file  Relationships  . Social connections:    Talks on phone: Not on file    Gets together: Not on file    Attends religious service: Not on file    Active member of club or organization: Not on file    Attends meetings of clubs or organizations: Not on file    Relationship status: Not on file  . Intimate partner violence:    Fear of current or ex partner: Not on file    Emotionally abused: Not on file    Physically abused: Not on file    Forced sexual activity: Not on file  Other Topics Concern  . Not on file  Social History Narrative   Patient is married with 3 children.   Patient is right handed.   Patient has 10 th grade education.   Patient drinks 4 cups daily.     Allergies  Allergen Reactions  . Codeine Nausea And Vomiting  . Lipitor [Atorvastatin] Nausea And Vomiting  . Phenergan [Promethazine Hcl] Other (See Comments)    Confusion, hallucinations, severe agitation    Family History  Problem Relation Age of Onset  . Leukemia Mother   . Diabetes Mother   . Cancer Father        esophageal ca  . Heart disease Father   . Diabetes Maternal Aunt   . Diabetes Maternal Uncle   . Diabetes Brother   . Diabetes Sister   . Heart attack Brother        x 2       Prior to Admission medications   Medication Sig Start Date End Date Taking? Authorizing Provider  acetaminophen (TYLENOL) 325 MG tablet Take 325 mg by mouth every 6 (six) hours as needed for mild pain.    Yes [provider]  aspirin EC 81 MG tablet Take 81 mg by mouth daily.   Yes [provider]  clopidogrel (PLAVIX) 75 MG tablet TAKE 1 TABLET BY MOUTH  DAILY Patient taking differently: Take 75 mg by mouth daily.  12/12/17  Yes [name], MD  Multiple Vitamin (MULTIVITAMIN WITH MINERALS) TABS tablet Take 1 tablet by mouth daily. 03/03/18  Yes Hosie Poisson, MD  rosuvastatin (CRESTOR) 20 MG tablet Take 1 tablet (20 mg total) by  mouth daily. Patient taking differently: Take 20 mg by mouth at bedtime.  02/25/18  Yes [name], MD  sitaGLIPtin-metformin (JANUMET) 50-1000 MG tablet Take 1 tablet by mouth 2 (two) times daily. Patient taking differently: Take 1 tablet by mouth 2 (two) times daily before a meal.  11/13/17  Yes [name], MD  aspirin EC 325 MG tablet Take 1 tablet (325 mg total) by mouth  daily. Patient taking differently: Take 81 mg by mouth daily.  04/10/18 04/10/19  Manuella Ghazi, Pratik D, DO  Blood Glucose Monitoring Suppl (ONE TOUCH ULTRA 2) w/Device KIT 1 Device by Does not apply route 2 (two) times daily. 08/21/17   [name], MD  feeding supplement, GLUCERNA SHAKE, (GLUCERNA SHAKE) LIQD Take 237 mLs by mouth 3 (three) times daily between meals. Patient not taking: Reported on 03/07/2018 03/02/18   Hosie Poisson, MD  glucose blood (ONE TOUCH ULTRA TEST) test strip Use as instructed 02/10/18   [name], MD  guaiFENesin (MUCINEX) 600 MG 12 hr tablet Take 1 tablet (600 mg total) by mouth 2 (two) times daily. Patient not taking: Reported on 03/30/2018 03/02/18   Hosie Poisson, MD  Lancets [facility] ULTRASOFT) lancets Use as instructed 08/21/17   [name], MD  magnesium oxide (MAG-OX) 400 (241.3 Mg) MG tablet Take 1 tablet (400 mg total) by mouth 2 (two) times daily. Patient not taking: Reported on 03/30/2018 03/02/18   Hosie Poisson, MD  megestrol (MEGACE) 400 MG/10ML suspension Take 10 mLs (400 mg total) by mouth 2 (two) times daily. Patient not taking: Reported on 04/17/2018 03/02/18   Hosie Poisson, MD  potassium & sodium phosphates (PHOS-NAK) 280-160-250 MG PACK Take 1 packet by mouth 4 (four) times daily -  with meals and at bedtime. Patient not taking: Reported on 03/10/2018 03/02/18   Hosie Poisson, MD     Physical Exam:  Vitals:   04/20/18 0630 04/20/18 0737 04/20/18 1224 04/20/18 1300  BP: (!) 161/103 (!) 131/53 (!) 166/66 (!) 157/63  Pulse: (!) 56 79 89 83  Resp: '16 18 17 19  ' Temp: 98.1  F (36.7 C) 98 F (36.7 C) 98.1 F (36.7 C) 98.2 F (36.8 C)  TempSrc: Oral Oral Oral Oral  SpO2: 98% 96% 96% 97%  Weight:      Height:       Constitutional: NAD, calm, currently having TIA symptoms, with right facial droop, blurred vision, dysarthria. Eyes: PERRL, lids and conjunctivae normal ENMT: Mucous membranes are moist, without exudate or lesions  Neck: normal, supple, no masses, no thyromegaly Respiratory: clear to auscultation bilaterally, no wheezing, no crackles. Normal respiratory effort  Cardiovascular: Regular rate and rhythm, occasional ectopic beat,  murmur, rubs or gallops. No extremity edema. 2+ pedal pulses. No carotid bruits.  Abdomen: Soft, non tender, No hepatosplenomegaly. Bowel sounds positive.  Musculoskeletal: no clubbing / cyanosis. Moves all extremities Skin: no jaundice, No lesions.  Neurologic: Sensation intact right facial droop, bilateral numbness in hands, no lower extremity abnormalities. Psychiatric:   Alert and oriented x 3. Normal mood.     Labs on Admission: I have personally reviewed following labs and imaging studies  CBC: Recent Labs  Lab 04/17/18 0941 04/19/18 1541  WBC 12.4* 13.0*  NEUTROABS 8.7* 9.4*  HGB 10.2* 9.6*  HCT 31.6* 30.8*  MCV 90 90.9  PLT 731* 669*    Basic Metabolic Panel: Recent Labs  Lab 04/17/18 0941 04/19/18 1541 04/19/18 1545  NA 141 140  --   K 5.0 3.3*  --   CL 101 104  --   CO2 23 26  --   GLUCOSE 179* 172*  --   BUN 10 15  --   CREATININE 0.80 0.84  --   CALCIUM 9.4 8.6*  --   MG  --   --  1.1*    GFR: Estimated Creatinine Clearance: 52.5 mL/min (by C-G formula based on SCr of 0.84 mg/dL).  Liver Function Tests: Recent Labs  Lab 04/17/18 0941 04/19/18 1541  AST 8 11*  ALT 6 8  ALKPHOS 82 61  BILITOT 0.3 0.5  PROT 6.3 6.5  ALBUMIN 3.9 3.2*   No results for input(s): LIPASE, AMYLASE in the last 168 hours. No results for input(s): AMMONIA in the last 168 hours.  Coagulation  Profile: Recent Labs  Lab 04/19/18 1541  INR 1.02    Cardiac Enzymes: Recent Labs  Lab 04/19/18 1541  TROPONINI <0.03    BNP (last 3 results) No results for input(s): PROBNP in the last 8760 hours.  HbA1C: No results for input(s): HGBA1C in the last 72 hours.  CBG: No results for input(s): GLUCAP in the last 168 hours.  Lipid Profile: No results for input(s): CHOL, HDL, LDLCALC, TRIG, CHOLHDL, LDLDIRECT in the last 72 hours.  Thyroid Function Tests: No results for input(s): TSH, T4TOTAL, FREET4, T3FREE, THYROIDAB in the last 72 hours.  Anemia Panel: No results for input(s): VITAMINB12, FOLATE, FERRITIN, TIBC, IRON, RETICCTPCT in the last 72 hours.  Urine analysis:    Component Value Date/Time   COLORURINE YELLOW 04/19/2018 2030   [place] 04/19/2018 2030   LABSPEC 1.020 04/19/2018 2030   LABSPEC 1.020 07/04/2017 1051   PHURINE 5.0 04/19/2018 2030   GLUCOSEU 150 (A) 04/19/2018 2030   [place] NEGATIVE 04/19/2018 2030   [place] NEGATIVE 04/19/2018 2030   BILIRUBINUR negative 07/04/2017 [place] 04/19/2018 2030   PROTEINUR NEGATIVE 04/19/2018 2030   UROBILINOGEN 0.2 07/25/2013 0952   NITRITE NEGATIVE 04/19/2018 2030   LEUKOCYTESUR TRACE (A) 04/19/2018 2030    Sepsis Labs: '@LABRCNTIP' (procalcitonin:4,lacticidven:4) )No results found for this or any previous visit (from the past 240 hour(s)).   Radiological Exams on Admission: Ct Head Wo Contrast  Result Date: 04/19/2018 CLINICAL DATA:  TIA. Right-sided weakness today. No reported injury. EXAM: CT HEAD WITHOUT CONTRAST TECHNIQUE: Contiguous axial images were obtained from the base of the skull through the vertex without intravenous contrast. COMPARISON:  04/09/2018 head CT. FINDINGS: Brain: Stable left frontal encephalomalacia. No evidence of parenchymal hemorrhage or extra-axial fluid collection. No mass lesion, mass effect, or midline shift. No CT evidence of acute infarction.  Generalized cerebral volume loss. Nonspecific mild subcortical and periventricular white matter hypodensity, most in keeping with chronic small vessel ischemic change. Cerebral ventricle sizes are stable and concordant with the degree of cerebral volume loss. Vascular: No acute abnormality. Skull: No evidence of calvarial fracture. Sinuses/Orbits: The visualized paranasal sinuses are essentially clear. Other:  The mastoid air cells are unopacified. IMPRESSION: 1.  No evidence of acute intracranial abnormality. 2. Stable left frontal encephalomalacia. 3. Mild chronic small vessel ischemic changes in the cerebral white matter. Electronically Signed   By: Ilona Sorrel M.D.   On: 04/19/2018 16:36    EKG: Independently reviewed.  Assessment/Plan Active Problems:   Hypertension associated with diabetes ([place])   DM type 2 with diabetic dyslipidemia ([facility])   Leukocytosis   History of CVA (cerebrovascular accident)   Non-small cell lung cancer ([place])   Slurred speech   Crescendo TIA symptoms increasingly frequent, manifested by right facial droop, hand numbness bilaterally, now every 5 minutes.  She had a recent admission from 04/09/2018 with similar symptoms, and another one on 03/30/2018.  There was a concern for seizures, however her symptoms are worse at the time of evaluation.  Code stroke was called.  MRI of the brain is pending.  CT of the head was unremarkable on presentation.  UDS  and alcohol negative.  UA negative.  Given 1 baby aspirin at this time while waiting for code stroke.  Risk factors include tobacco abuse, hypertension, thrombocythemia diabetes, history of lung cancer, use of megestrol, female, menopausal on other risk factors Admit to telemetry inpatient Code stroke to proceed with orders, which may include MRA of the head and neck, echo, and other imaging.  Will defer further orders by neuro Recent hemoglobin A1c on 04/09/2018 was 9.4, LDL 37, HDL 28, triglycerides 117, total cholesterol 88.   Will not repeat the labs. Patient Holter monitor is present, placed on 04/18/2018. Continue aspirin daily Discontinue Megace.  Diabetes mellitus, recent hemoglobin A1c 9.4 Sliding scale insulin  Hypertension, was not on meds at the time of admission. Allow permissive hypertension  Squamous cell lung cancer, follows Dr. Randa Ngo, status post radiation therapy.   The patient has an appointment for follow-up with patient oncology in October 2019  Chronic leukocytosis and anemia, white count today is 13, hemoglobin 9.6.  The patient follows hematology Continue daily CBC  Thrombocythemia, platelet count is 669,?  Contributor to symptoms.  Patient received her admission aspirin 325 mg, followed by baby aspirin. CBC daily Follow-up with hematology as an outpatient   Hyperlipidemia LDL 37, HDL 28, triglycerides 117, total cholesterol 88 Continue Crestor  Hypomagnesemia.  This was replenished IV, to continue being replenished orally. Mag-Ox 400 mg p.o. twice daily  DVT prophylaxis: Lovenox Code Status:    Full code Family Communication:  Discussed with patient and family at bedside Disposition Plan: Expect patient to be discharged to home after condition improves Consults called:    Neurology consultation ongoing by stroke team Admission status: Telemetry Inpatient  Sharene Butters, PA-C [facility]   Amion text  [phone]   04/20/2018, 1:21 PM

## 2018-04-20 NOTE — Progress Notes (Signed)
Patient states she is having another episode of hand numbness/tingling. Performed NIH and results were zero. MD and Neuro aware of these type results

## 2018-04-20 NOTE — Consult Note (Addendum)
NEURO HOSPITALIST  CONSULT     Requesting Physician: Dr. Evangeline Gula   Chief Complaint: bilateral hand numbness  History obtained from:  Patient  / Family  HPI:                                                                                                                                         [name] is an 77 y.o. female  With PMH CVA, HTN, HLD, DM diabetic neuropathy patient originally presented to [facility] for  Sudden onset of numbness and heaviness to right hand. She was seen by tele neurology there and transferred [facility]. Code stroke was called on patient today for similar symptoms.   Per patient and daughter since about April patient has been having frequent episodes of feeling like her right hand " has gone to sleep",  facial droop and slurred speech. The episodes last 15-20 minutes. Per daughter these episodes happen the same way every time. She was recently hospitalized for similar symptoms but all on the left side. She has had 2 episodes today both lasting the standard 15-20 minutes.  The patient states that she may have been having these even for years, but the daughter states that she thinks that the current episodes have only been going on since April.  Hospital course:  Ct head:  No acute abnormality. BG: 172 BP: 166/66  12/14 CVA: left posterior frontal lobe involving cortex and white matter.   Date last known well: Date: 04/20/2018 Time last known well: Time: 12:40 tPA Given: No:  Too mild to treat Modified Rankin: Rankin Score=1 NIHSS:0     Past Medical History:  Diagnosis Date  . Carotid artery occlusion   . Clostridium difficile infection 06/2013  . Diabetes mellitus    takes Janumet daily  . Dyslipidemia    takes Crestor daily  . Gallstones   . GERD (gastroesophageal reflux disease)   . Heart murmur    hx of  . History of radiation therapy 06/11/17-06/21/17   right lung 50 Gy in 5  fractions  . Hypertension    takes Prinzide and Verapamil daily  . Impaired speech    from stroke  . Neuropathy, diabetic ([place])   . Pneumonia   . PONV (postoperative nausea and vomiting)   . Smoker   . Stroke ([place]) 06/25/13  . Vertigo    but doesn't take any meds    Past Surgical History:  Procedure Laterality Date  . cataract removed Right   . CHOLECYSTECTOMY    . ENDARTERECTOMY Left 07/14/2013   Procedure: ENDARTERECTOMY CAROTID-LEFT;  Surgeon: Elam Dutch, MD;  Location[facility];  Service: Vascular;  Laterality: Left;  . IR PERC PLEURAL DRAIN W/INDWELL CATH W/IMG GUIDE  05/23/2017  . KNEE SURGERY Left 80yrs ago  . LAPAROSCOPIC PARTIAL COLECTOMY N/A 09/02/2015   Procedure: LAPAROSCOPIC PARTIAL RIGHT COLECTOMY;  Surgeon: Leighton Ruff, MD;  [facility];  Service: General;  Laterality: N/A;  . PATCH ANGIOPLASTY Left 07/14/2013   Procedure: LEFT CAROTID ARTERY PATCH ANGIOPLASTY;  Surgeon: Elam Dutch, MD;  Location: [facility];  Service: Vascular;  Laterality: Left;    Family History  Problem Relation Age of Onset  . Leukemia Mother   . Diabetes Mother   . Cancer Father        esophageal ca  . Heart disease Father   . Diabetes Maternal Aunt   . Diabetes Maternal Uncle   . Diabetes Brother   . Diabetes Sister   . Heart attack Brother        x 2        Social History:  reports that she has been smoking cigarettes. She has a 30.00 pack-year smoking history. She has never used smokeless tobacco. She reports that she does not drink alcohol or use drugs.  Allergies:  Allergies  Allergen Reactions  . Codeine Nausea And Vomiting  . Lipitor [Atorvastatin] Nausea And Vomiting  . Phenergan [Promethazine Hcl] Other (See Comments)    Confusion, hallucinations, severe agitation    Medications:                                                                                                                           Scheduled: . aspirin  81 mg Oral Daily  . enoxaparin  (LOVENOX) injection  30 mg Subcutaneous Q24H  . magnesium oxide  400 mg Oral BID   Continuous: . sodium chloride 250 mL (04/20/18 0002)   EPP:IRJJOA chloride, dextromethorphan-guaiFENesin, menthol-cetylpyridinium   ROS:                                                                                                                                       ROS was performed and is negative except as noted in HPI   General Examination:  Blood pressure (!) 157/63, pulse 83, temperature 98.2 F (36.8 C), temperature source Oral, resp. rate 19, height 5\' 6"  (1.676 m), weight 62 kg, SpO2 97 %.  HEENT-  Normocephalic, no lesions, without obvious abnormality.  Normal external eye and conjunctiva.  Cardiovascular- S1-S2 audible, pulses palpable throughout   Lungs-no rhonchi or wheezing noted, no excessive working breathing.  Saturations within normal limits Abdomen- All 4 quadrants palpated and nontender Extremities- Warm, dry and intact Musculoskeletal-no joint tenderness, deformity or swelling Skin-warm and dry, no hyperpigmentation, vitiligo, or suspicious lesions  Neurological Examination Mental Status: Alert, oriented, thought content appropriate.  Speech fluent without evidence of aphasia.  Able to follow 3 step commands without difficulty. Cranial Nerves: GQ:QPYPPJ fields grossly normal,  III,IV, VI: ptosis not present, extra-ocular motions intact bilaterally, pupils equal, round, reactive to light and accommodation V,VII: smile symmetric, facial light touch sensation normal bilaterally VIII: hearing normal bilaterally IX,X: uvula rises symmetrically XI: bilateral shoulder shrug XII: midline tongue extension Motor: Right : Upper extremity   5/5  Left:     Upper extremity   5/5  Lower extremity   5/5   Lower extremity   5/5 Tone and bulk:normal tone throughout; no atrophy  noted Sensory: Pinprick and light touch intact throughout, bilaterally Deep Tendon Reflexes: 2+ and symmetric biceps and patella Plantars: Right: downgoing   Left: downgoing Cerebellar: normal finger-to-nose, normal rapid alternating movements and normal heel-to-shin test Gait: deferred   Lab Results: Basic Metabolic Panel: Recent Labs  Lab 04/17/18 0941 04/19/18 1541 04/19/18 1545  NA 141 140  --   K 5.0 3.3*  --   CL 101 104  --   CO2 23 26  --   GLUCOSE 179* 172*  --   BUN 10 15  --   CREATININE 0.80 0.84  --   CALCIUM 9.4 8.6*  --   MG  --   --  1.1*    CBC: Recent Labs  Lab 04/17/18 0941 04/19/18 1541  WBC 12.4* 13.0*  NEUTROABS 8.7* 9.4*  HGB 10.2* 9.6*  HCT 31.6* 30.8*  MCV 90 90.9  PLT 731* 669*   Imaging: Ct Head Wo Contrast  Result Date: 04/19/2018 CLINICAL DATA:  TIA. Right-sided weakness today. No reported injury. EXAM: CT HEAD WITHOUT CONTRAST TECHNIQUE: Contiguous axial images were obtained from the base of the skull through the vertex without intravenous contrast. COMPARISON:  04/09/2018 head CT. FINDINGS: Brain: Stable left frontal encephalomalacia. No evidence of parenchymal hemorrhage or extra-axial fluid collection. No mass lesion, mass effect, or midline shift. No CT evidence of acute infarction. Generalized cerebral volume loss. Nonspecific mild subcortical and periventricular white matter hypodensity, most in keeping with chronic small vessel ischemic change. Cerebral ventricle sizes are stable and concordant with the degree of cerebral volume loss. Vascular: No acute abnormality. Skull: No evidence of calvarial fracture. Sinuses/Orbits: The visualized paranasal sinuses are essentially clear. Other:  The mastoid air cells are unopacified. IMPRESSION: 1.  No evidence of acute intracranial abnormality. 2. Stable left frontal encephalomalacia. 3. Mild chronic small vessel ischemic changes in the cerebral white matter. Electronically Signed   By: Ilona Sorrel M.D.   On: 04/19/2018 16:36   Laurey Morale, MSN, NP-C [facility] [phone]   04/20/2018, 1:19 PM   Attending physician note to follow with Assessment and plan .  I have seen the patient reviewed the above note.  She has a normal neurological exam at this time.  Assessment: 77 y.o. female With PMH CVA, HTN,  HLD, DM with recurrent stereotyped spells of hand numbness/weakness and facial droop.  The changeable side is very unusual, however stereotyped nature does suggest possibility of seizure.  The recurrent episodes without permanent deficit since April would argue against TIA.  If it is TIA, it would not be cardioembolic, but could be small vessel disease.   Impression: # partial seizure-EEG Stroke Risk Factors - diabetes mellitus, hyperlipidemia and hypertension    Recommendations: --MRI Brain  --EEG - Start Keppra --Telemetry monitoring --Frequent neuro checks --Stroke swallow screen   Roland Rack, MD [facility] [phone]  If 7pm- 7am, please page neurology on call as listed in [place].

## 2018-04-21 ENCOUNTER — Other Ambulatory Visit: Payer: Self-pay

## 2018-04-21 ENCOUNTER — Inpatient Hospital Stay (HOSPITAL_COMMUNITY): Payer: Medicare Other

## 2018-04-21 ENCOUNTER — Ambulatory Visit: Admission: RE | Admit: 2018-04-21 | Payer: Medicare Other | Source: Ambulatory Visit | Admitting: Radiation Oncology

## 2018-04-21 ENCOUNTER — Telehealth: Payer: Self-pay | Admitting: *Deleted

## 2018-04-21 DIAGNOSIS — E785 Hyperlipidemia, unspecified: Secondary | ICD-10-CM

## 2018-04-21 DIAGNOSIS — D473 Essential (hemorrhagic) thrombocythemia: Secondary | ICD-10-CM

## 2018-04-21 DIAGNOSIS — I10 Essential (primary) hypertension: Secondary | ICD-10-CM

## 2018-04-21 DIAGNOSIS — R569 Unspecified convulsions: Secondary | ICD-10-CM

## 2018-04-21 DIAGNOSIS — D72829 Elevated white blood cell count, unspecified: Secondary | ICD-10-CM

## 2018-04-21 DIAGNOSIS — E1169 Type 2 diabetes mellitus with other specified complication: Secondary | ICD-10-CM

## 2018-04-21 DIAGNOSIS — I499 Cardiac arrhythmia, unspecified: Secondary | ICD-10-CM

## 2018-04-21 LAB — BASIC METABOLIC PANEL
Anion gap: 8 (ref 5–15)
BUN: 6 mg/dL — AB (ref 8–23)
CHLORIDE: 101 mmol/L (ref 98–111)
CO2: 26 mmol/L (ref 22–32)
CREATININE: 0.7 mg/dL (ref 0.44–1.00)
Calcium: 8.5 mg/dL — ABNORMAL LOW (ref 8.9–10.3)
GFR calc Af Amer: >60 mL/min (ref >60)
GFR calc non Af Amer: >60 mL/min (ref >60)
Glucose, Bld: 197 mg/dL — ABNORMAL HIGH (ref 70–99)
POTASSIUM: 3.6 mmol/L (ref 3.5–5.1)
SODIUM: 135 mmol/L (ref 135–145)

## 2018-04-21 LAB — GLUCOSE, CAPILLARY
GLUCOSE-CAPILLARY: 187 mg/dL — AB (ref 70–99)
GLUCOSE-CAPILLARY: 207 mg/dL — AB (ref 70–99)
GLUCOSE-CAPILLARY: 161 mg/dL — AB (ref 70–99)
Glucose-Capillary: 160 mg/dL — ABNORMAL HIGH (ref 70–99)

## 2018-04-21 LAB — CBC
HCT: 30.2 % — ABNORMAL LOW (ref 36.0–46.0)
HEMOGLOBIN: 9.5 g/dL — AB (ref 12.0–15.0)
MCH: 28.3 pg (ref 26.0–34.0)
MCHC: 31.5 g/dL (ref 30.0–36.0)
MCV: 89.9 fL (ref 78.0–100.0)
Platelets: 609 10*3/uL — ABNORMAL HIGH (ref 150–400)
RBC: 3.36 MIL/uL — AB (ref 3.87–5.11)
RDW: 16 % — ABNORMAL HIGH (ref 11.5–15.5)
WBC: 11.8 10*3/uL — ABNORMAL HIGH (ref 4.0–10.5)

## 2018-04-21 LAB — MAGNESIUM: Magnesium: 1.4 mg/dL — ABNORMAL LOW (ref 1.7–2.4)

## 2018-04-21 MED ORDER — TRIAMCINOLONE ACETONIDE 55 MCG/ACT NA AERO: 1.0000 | INHALATION_SPRAY | Freq: Every day | NASAL | 0 refills | Status: DC

## 2018-04-21 MED ORDER — MAGNESIUM OXIDE 400 (241.3 MG) MG PO TABS: 400.0000 mg | ORAL_TABLET | Freq: Two times a day (BID) | ORAL | 0 refills | Status: DC

## 2018-04-21 MED ORDER — TRIAMCINOLONE ACETONIDE 55 MCG/ACT NA AERO
1.0000 | INHALATION_SPRAY | Freq: Every day | NASAL | Status: DC
  Filled 2018-04-21: qty 10.8

## 2018-04-21 MED ORDER — MAGNESIUM SULFATE 2 GM/50ML IV SOLN
2.0000 g | Freq: Once | INTRAVENOUS | Status: AC
  Administered 2018-04-21: 2 g via INTRAVENOUS
  Filled 2018-04-21 (×2): qty 50

## 2018-04-21 MED ORDER — LEVETIRACETAM 500 MG PO TABS: 500.0000 mg | ORAL_TABLET | Freq: Two times a day (BID) | ORAL | 0 refills | Status: DC

## 2018-04-21 NOTE — Progress Notes (Signed)
EEG Completed; Results Pending  

## 2018-04-21 NOTE — Evaluation (Signed)
Occupational Therapy Evaluation Patient Details Name: [name] MRN: [id number] DOB: 1940-08-24 Today's Date: 04/21/2018    History of Present Illness Patient is a 77 y/o female who presents with transient episodes of hand numbness left greater than right. Concern for seizure activity. Head CT and Brain MRI-unremarkable. PMH includes vertigo, stroke, HTN, DM.   Clinical Impression   This 77 yo female admitted with above presents to acute OT with decreased balance, decreased path finding, and potential visual deficits. She will continue to benefit from acute OT with follow up [place] recommended as well as 24 hour S/prn A from family.    Follow Up Recommendations  Home health OT;Supervision/Assistance - 24 hour    Equipment Recommendations  None recommended by OT       Precautions / Restrictions Precautions Precautions: Fall Restrictions Weight Bearing Restrictions: No      Mobility Bed Mobility Overal bed mobility: Independent                Transfers Overall transfer level: Needs assistance Equipment used: None Transfers: Sit to/from Stand Sit to Stand: Supervision              Balance Overall balance assessment: Needs assistance Sitting-balance support: Feet supported;No upper extremity supported Sitting balance-Leahy Scale: Good     Standing balance support: During functional activity;No upper extremity supported Standing balance-Leahy Scale: Fair                             ADL either performed or assessed with clinical judgement   ADL Overall ADL's : Needs assistance/impaired                                       General ADL Comments: S due to intermittent swaying when up on her feet. I did educate pt and dtr that pt needed to be monitored closely when in kitchen at least intially due to potential vision deficits (pt in hallway tended to stay to really close to left wall and she did stump her left foot on bedside table  rolling platform. Spoke with dtr in room and recommended that the few times the patient cooks that dtr needs to be with her due to possible visual deficits--dtr verbalized understanding.     Vision Baseline Vision/History: No visual deficits Additional Comments: PT reported a week ago that she wore glasses for reading, to me she reported she does not wear glasses. Pt could not successfully follow testing for occulomotor ROM and tracking (she started off following me correctly then would make her own path with her eyes). When looking at visual fields pt would report she saw pen before it was in her visual field.            Pertinent Vitals/Pain Pain Assessment: No/denies pain     Hand Dominance Right   Extremity/Trunk Assessment Upper Extremity Assessment Upper Extremity Assessment: Overall WFL for tasks assessed(No linger deficits at this time, Both are 5/5 strength, coordination intact, and sensation intact)           Communication Communication Communication: No difficulties   Cognition Arousal/Alertness: Awake/alert Behavior During Therapy: WFL for tasks assessed/performed Overall Cognitive Status: Impaired/Different from baseline Area of Impairment: Following commands;Safety/judgement;Problem solving  General Comments: Pt swaying intermittently as she walked in hallways (pt and dtr report this is normal for her and she has not fallen). On way back to pt's room she told me correct room number but then went into a room 10 numbers below hers--she did stop when she saw someone in the bed, turned around and then did mangage to find  her room.              Home Living Family/patient expects to be discharged to:: Private residence Living Arrangements: Spouse/significant other;Children Available Help at Discharge: Family;Available 24 hours/day Type of Home: House Home Access: Stairs to enter   Entrance Stairs-Rails: None Home Layout:  One level     Bathroom Shower/Tub: Occupational psychologist: Standard     Home Equipment: Environmental consultant - 2 wheels;Shower seat;Cane - single point;Bedside commode          Prior Functioning/Environment Level of Independence: Independent        Comments: Pt's husband recently put under Hospice care--he can do all of his basic ADLs by himself. Wife does all of IADLs and drives. Dtr works from home        OT Problem List: Impaired balance (sitting and/or standing);Decreased cognition;Decreased safety awareness(?impaired vision)      OT Treatment/Interventions: Self-care/ADL training;Balance training;Visual/perceptual remediation/compensation;Patient/family education    OT Goals(Current goals can be found in the care plan section) Acute Rehab OT Goals Patient Stated Goal: return home OT Goal Formulation: With patient/family Time For Goal Achievement: 05/05/18 Potential to Achieve Goals: Good  OT Frequency: Min 2X/week              AM-PAC PT "6 Clicks" Daily Activity     Outcome Measure Help from another person eating meals?: None Help from another person taking care of personal grooming?: A Little Help from another person toileting, which includes using toliet, bedpan, or urinal?: A Little Help from another person bathing (including washing, rinsing, drying)?: A Little Help from another person to put on and taking off regular upper body clothing?: A Little Help from another person to put on and taking off regular lower body clothing?: A Little 6 Click Score: 19   End of Session Equipment Utilized During Treatment: Gait belt  Activity Tolerance: Patient tolerated treatment well Patient left: in bed;with call bell/phone within reach;with bed alarm set;with family/visitor present  OT Visit Diagnosis: Unsteadiness on feet (R26.81);Other abnormalities of gait and mobility (R26.89);Other (comment)(? visual issues)                Time: 8366-2947 OT Time Calculation  (min): 24 min Charges:  OT General Charges $OT Visit: 1 Visit OT Evaluation $OT Eval Moderate Complexity: 1 Mod OT Treatments $Self Care/Home Management : 8-22 mins  [place] Circle, OTR/L Acute [employer] Pager [phone] Office [phone]

## 2018-04-21 NOTE — Procedures (Signed)
ELECTROENCEPHALOGRAM REPORT   Patient: [name]       Room #: 8M03F EEG No. ID: [id number] Age: 77 y.o.        Sex: female Referring Physician: Sarajane Jews Report Date:  04/21/2018        Interpreting Physician: Alexis Goodell  History: [name] is an 77 y.o. female with transient episodes of hand numbness  Medications:  ASA, Plavix, Insulin, Keppra, Mag-Ox, Crestor, Nasacort  Conditions of Recording:  This is a 21 channel routine scalp EEG performed with bipolar and monopolar montages arranged in accordance to the international 10/20 system of electrode placement. One channel was dedicated to EKG recording.  The patient is in the awake and drowsy states.  Description:  Muscle and movement artifact are prominent during the recording and most persistent over the right hemisphere.  When able to be visualized the waking background activity consists of a low voltage, symmetrical, fairly well organized, 9 Hz alpha activity, seen from the parieto-occipital and posterior temporal regions.  Low voltage fast activity, poorly organized, is seen anteriorly and is at times superimposed on more posterior regions.  A mixture of theta and alpha rhythms are seen from the central and temporal regions. The patient drowses with slowing to irregular, low voltage theta and beta activity.   There are some occasional infrequent episodes when the right hemisphere appears to be slowed compared to the left but this is difficult to distinguish and may be artifactual.   Stage II sleep is not obtained.   No epileptiform activity is noted.   Hyperventilation and intermittent photic stimulation were not performed.  IMPRESSION: This electroencephalogram shows normal wakefulness and drowse.   The occasional slowing noted over the right hemisphere may be artifactual.  Clinical correlation recommended.  No epileptiform activity is noted.     Alexis Goodell, MD Neurology [phone] 04/21/2018, 1:21 PM

## 2018-04-21 NOTE — Evaluation (Signed)
Physical Therapy Evaluation Patient Details Name: [name] MRN: [id number] DOB: February 02, 1941 Today's Date: 04/21/2018   History of Present Illness  Patient is a 77 y/o female who presents with transient episodes of hand numbness left greater than right. Concern for seizure activity. Head CT and Brain MRI-unremarkable. PMH includes vertigo, stroke, HTN, DM.  Clinical Impression  Patient presents with balance deficits and impaired problem solving s/p above. Tolerated gait training and stair training with Min guard-Min A for balance/safety. Pt drifting to right/left worsened with head turns. Difficulty navigating hallway and signage to find way back to room requiring max cues. Pt independent PTA and lives with spouse and daughter who works from home. May need to use DME to improve balance and decrease fall risk. Will follow acutely to maximize independence and mobility prior to return home.     Follow Up Recommendations Home health PT;Supervision for mobility/OOB    Equipment Recommendations  None recommended by PT    Recommendations for Other Services       Precautions / Restrictions Precautions Precautions: Fall Restrictions Weight Bearing Restrictions: No      Mobility  Bed Mobility Overal bed mobility: Modified Independent                Transfers Overall transfer level: Needs assistance Equipment used: None Transfers: Sit to/from Stand Sit to Stand: Supervision         General transfer comment: Close Supervision for safety due to unsteadiness.   Ambulation/Gait Ambulation/Gait assistance: Min guard Gait Distance (Feet): 200 Feet Assistive device: None Gait Pattern/deviations: Step-through pattern;Decreased stride length;Drifts right/left Gait velocity: decreased   General Gait Details: Slow, unsteady gait with drifting to right/left esp with head turns, not able to navigate signage.  Stairs Stairs: Yes Stairs assistance: Min guard Stair Management:  Step to pattern;Alternating pattern;One rail Right Number of Stairs: 3 General stair comments: Cues for safety.   Wheelchair Mobility    Modified Rankin (Stroke Patients Only) Modified Rankin (Stroke Patients Only) Pre-Morbid Rankin Score: Slight disability Modified Rankin: Moderately severe disability     Balance Overall balance assessment: Needs assistance Sitting-balance support: Feet supported;No upper extremity supported Sitting balance-Leahy Scale: Good     Standing balance support: During functional activity Standing balance-Leahy Scale: Fair                               Pertinent Vitals/Pain Pain Assessment: No/denies pain    Home Living Family/patient expects to be discharged to:: Private residence Living Arrangements: Spouse/significant other;Children Available Help at Discharge: Family;Available 24 hours/day Type of Home: House Home Access: Stairs to enter Entrance Stairs-Rails: None Entrance Stairs-Number of Steps: 1 Home Layout: One level Home Equipment: Walker - 2 wheels;Shower seat;Cane - single point;Bedside commode      Prior Function Level of Independence: Independent         Comments: independent with ADLs, drives. Daughter works from home.     Hand Dominance   Dominant Hand: Right    Extremity/Trunk Assessment   Upper Extremity Assessment Upper Extremity Assessment: Defer to OT evaluation    Lower Extremity Assessment Lower Extremity Assessment: Generalized weakness    Cervical / Trunk Assessment Cervical / Trunk Assessment: Normal  Communication   Communication: No difficulties  Cognition Arousal/Alertness: Awake/alert Behavior During Therapy: WFL for tasks assessed/performed Overall Cognitive Status: Impaired/Different from baseline Area of Impairment: Following commands;Memory;Safety/judgement  Memory: Decreased short-term memory Following Commands: Follows multi-step commands  inconsistently Safety/Judgement: Decreased awareness of deficits     General Comments: Difficulty following multi step commands consistently. Diffculty navigating hallway and reading signs/finding way back to room.      General Comments General comments (skin integrity, edema, etc.): Daughter present during session.    Exercises     Assessment/Plan    PT Assessment Patient needs continued PT services  PT Problem List Decreased mobility;Decreased balance;Decreased cognition;Decreased safety awareness       PT Treatment Interventions Functional mobility training;Balance training;Patient/family education;Gait training;Therapeutic activities;Therapeutic exercise;Stair training;Neuromuscular re-education;DME instruction    PT Goals (Current goals can be found in the Care Plan section)  Acute Rehab PT Goals Patient Stated Goal: return home PT Goal Formulation: With patient Time For Goal Achievement: 05/05/18 Potential to Achieve Goals: Good    Frequency Min 4X/week   Barriers to discharge        Co-evaluation               AM-PAC PT "6 Clicks" Daily Activity  Outcome Measure Difficulty turning over in bed (including adjusting bedclothes, sheets and blankets)?: None Difficulty moving from lying on back to sitting on the side of the bed? : A Little Difficulty sitting down on and standing up from a chair with arms (e.g., wheelchair, bedside commode, etc,.)?: None Help needed moving to and from a bed to chair (including a wheelchair)?: None Help needed walking in hospital room?: A Little Help needed climbing 3-5 steps with a railing? : A Little 6 Click Score: 21    End of Session Equipment Utilized During Treatment: Gait belt Activity Tolerance: Patient tolerated treatment well Patient left: in bed;with call bell/phone within reach;with bed alarm set;with family/visitor present Nurse Communication: Mobility status PT Visit Diagnosis: Unsteadiness on feet (R26.81);Other  abnormalities of gait and mobility (R26.89);Muscle weakness (generalized) (M62.81)    Time: 2774-1287 PT Time Calculation (min) (ACUTE ONLY): 18 min   Charges:   PT Evaluation $PT Eval Moderate Complexity: 1 Mod          Wray Kearns, PT, DPT Acute Rehabilitation Services Pager [phone] Office [place] 04/21/2018, 10:14 AM

## 2018-04-21 NOTE — Discharge Summary (Signed)
Physician Discharge Summary  VENESA SEMIDEY WEX:[id number] DOB: 07-07-1941 DOA: 04/19/2018  PCP: [name], MD  Admit date: 04/19/2018 Discharge date: 04/21/2018  Recommendations for Outpatient Follow-up:   Slurred speech, bilateral hand numbness, suspicious for partial seizures.  --EEG showed normal wakefulness and drowsiness.  Occasional slowing noted of the right hemisphere may be artifactual.  No epileptiform activity noted. --Continue Keppra per neurology.  Cleared for discharge.  Follow-up with neurology as an outpatient.  Chronic leukocytosis, normocytic anemia, thrombocytosis.  --Stable.  No further inpatient evaluation suggested. Consider outpatient evaluation.  Follow-up Information    [name], MD Follow up.   Specialty:  Family Medicine Why:  as needed Contact information: [address] [place]  [address] [phone]            Discharge Diagnoses:  1. Slurred speech, bilateral hand numbness, suspicious for partial seizures 2. Diabetes mellitus type 2 with diabetic neuropathy 3. Essential hypertension 4. Hyperlipidemia 5. Chronic leukocytosis, normocytic anemia, thrombocytosis.   Discharge Condition: improved Disposition: home HHPT, OT  Diet recommendation: heart healthy, diabetic diet  Filed Weights   04/19/18 1501 04/19/18 2230  Weight: 62 kg 62 kg    History of present illness:  77 year old woman PMH lung cancer, presented with hand numbness and slurred speech lasting 15 to 20 minutes with complete resolution.  Recently admitted for suspected TIA with negative evaluation.  On presentation again with similar symptoms, initial neurology assessment was suspicious for seizures.  Hospital Course:  Limited work-up was unrevealing, EEG unremarkable.  Patient had several spells during her hospitalization.  Seen by neurology felt to have partial seizures, recommendation to continue Keppra started on admission and follow-up with  neurology as an outpatient.  Individual issues as below.  Slurred speech, bilateral hand numbness, suspicious for partial seizures.  Recurrent episodes since April without permanent deficit argue against TIA.  MRI brain, CT head no acute abnormalities.  Echocardiogram reassuring. --EEG showed normal wakefulness and drowsiness.  Occasional slowing noted of the right hemisphere may be artifactual.  No epileptiform activity noted. --Continue Keppra per neurology.  Cleared for discharge.  Follow-up with neurology as an outpatient.  Diabetes mellitus type 2 with diabetic neuropathy --CBG stable.  Essential hypertension --Stable.  Hyperlipidemia --Continue statin  Chronic leukocytosis, normocytic anemia, thrombocytosis.  --Stable.  No further inpatient evaluation suggested.  PMH lung cancer treated with radiation  Admitted [phone] for TIA with left-sided facial numbness, left-sided arm weakness and numbness.  MRI of the brain was negative.  Echocardiogram was unremarkable.  Aspirin was added to Plavix.  She was discharged home to follow-up with neurology in 4 weeks and a Holter monitor.  Consultants:  Neurology   Procedures:  Echo Study Conclusions  - Left ventricle: The cavity size was normal. Wall thickness was increased in a pattern of mild LVH. Systolic function was normal. The estimated ejection fraction was in the range of 60% to 65%. Wall motion was normal; there were no regional wall motion abnormalities. Findings consistent with left ventricular diastolic dysfunction, grade indeterminate. Doppler parameters are consistent with high ventricular filling pressure. - Aortic valve: Moderately calcified annulus. Mildly calcified leaflets. There may be a mild degree of calcific aortic stenosis but leaflet excursion is difficult to appreciate on short axis views. - Right ventricle: The cavity size was mildly dilated. Systolic function was mildly  reduced. - Right atrium: The atrium was mildly dilated. - Systemic veins: The IVC is dilated with normal respiratory variation. Estimated CVP 8 mmHg.  EEG IMPRESSION:  This electroencephalogram shows normal wakefulness and drowse. The occasional slowing noted over the right hemisphere may be artifactual. Clinical correlation recommended. No epileptiform activity is noted.  Today's assessment: o See progress note same day    Discharge Instructions  Discharge Instructions    Diet - low sodium heart healthy   Complete by:  As directed    Diet Carb Modified   Complete by:  As directed    Discharge instructions   Complete by:  As directed    Call your physician or seek immediate medical attention for passing out, confusion, seizures or worsening of condition.  Per [facility] statutes, patients with seizures are not allowed to drive until they have been seizure-free for six months.  Use caution when using heavy equipment or power tools. Avoid working on ladders or at heights. Take showers instead of baths. Ensure the water temperature is not too high on the home water heater. Do not go swimming alone. Do not lock yourself in a room alone (i.e. bathroom). When caring for infants or small children, sit down when holding, feeding, or changing them to minimize risk of injury to the child in the event you have a seizure. Maintain good sleep hygiene. Avoid alcohol.  If patienthas another seizure, call 911 and bring them back to the ED if: A. The seizure lasts longer than 5 minutes.  B. The patient doesn't wake shortly after the seizure or has new problems such as difficulty seeing, speaking or moving following the seizure C. The patient was injured during the seizure D. The patient has a temperature over 102 F (39C) E. The patient vomited during the seizure and now is having trouble breathing     Allergies as of 04/21/2018      Reactions   Codeine Nausea And Vomiting     Lipitor [atorvastatin] Nausea And Vomiting   Phenergan [promethazine Hcl] Other (See Comments)   Confusion, hallucinations, severe agitation      Medication List    STOP taking these medications   feeding supplement (GLUCERNA SHAKE) Liqd   guaiFENesin 600 MG 12 hr tablet Commonly known as:  MUCINEX   megestrol 400 MG/10ML suspension Commonly known as:  MEGACE   potassium & sodium phosphates 280-160-250 MG Pack Commonly known as:  PHOS-NAK     TAKE these medications   acetaminophen 325 MG tablet Commonly known as:  TYLENOL Take 325 mg by mouth every 6 (six) hours as needed for mild pain.   aspirin EC 81 MG tablet Take 81 mg by mouth daily. What changed:  Another medication with the same name was removed. Continue taking this medication, and follow the directions you see here.   clopidogrel 75 MG tablet Commonly known as:  PLAVIX TAKE 1 TABLET BY MOUTH  DAILY   glucose blood test strip Use as instructed   levETIRAcetam 500 MG tablet Commonly known as:  KEPPRA Take 1 tablet (500 mg total) by mouth 2 (two) times daily.   magnesium oxide 400 (241.3 Mg) MG tablet Commonly known as:  MAG-OX Take 1 tablet (400 mg total) by mouth 2 (two) times daily.   multivitamin with minerals Tabs tablet Take 1 tablet by mouth daily.   ONE TOUCH ULTRA 2 w/Device Kit 1 Device by Does not apply route 2 (two) times daily.   onetouch ultrasoft lancets Use as instructed   rosuvastatin 20 MG tablet Commonly known as:  CRESTOR Take 1 tablet (20 mg total) by mouth daily. What changed:  when to  take this   sitaGLIPtin-metformin 50-1000 MG tablet Commonly known as:  JANUMET Take 1 tablet by mouth 2 (two) times daily. What changed:  when to take this   triamcinolone 55 MCG/ACT Aero nasal inhaler Commonly known as:  NASACORT Place 1 spray into the nose daily. Start taking on:  04/22/2018      Allergies  Allergen Reactions  . Codeine Nausea And Vomiting  . Lipitor  [Atorvastatin] Nausea And Vomiting  . Phenergan [Promethazine Hcl] Other (See Comments)    Confusion, hallucinations, severe agitation    The results of significant diagnostics from this hospitalization (including imaging, microbiology, ancillary and laboratory) are listed below for reference.    Significant Diagnostic Studies: Ct Head Wo Contrast  Result Date: 04/19/2018 CLINICAL DATA:  TIA. Right-sided weakness today. No reported injury. EXAM: CT HEAD WITHOUT CONTRAST TECHNIQUE: Contiguous axial images were obtained from the base of the skull through the vertex without intravenous contrast. COMPARISON:  04/09/2018 head CT. FINDINGS: Brain: Stable left frontal encephalomalacia. No evidence of parenchymal hemorrhage or extra-axial fluid collection. No mass lesion, mass effect, or midline shift. No CT evidence of acute infarction. Generalized cerebral volume loss. Nonspecific mild subcortical and periventricular white matter hypodensity, most in keeping with chronic small vessel ischemic change. Cerebral ventricle sizes are stable and concordant with the degree of cerebral volume loss. Vascular: No acute abnormality. Skull: No evidence of calvarial fracture. Sinuses/Orbits: The visualized paranasal sinuses are essentially clear. Other:  The mastoid air cells are unopacified. IMPRESSION: 1.  No evidence of acute intracranial abnormality. 2. Stable left frontal encephalomalacia. 3. Mild chronic small vessel ischemic changes in the cerebral white matter. Electronically Signed   By: Ilona Sorrel M.D.   On: 04/19/2018 16:36   Mr Brain Wo Contrast  Result Date: 04/20/2018 CLINICAL DATA:  [place] TIA with bilateral hand numbness, right facial droop, and slurred speech. EXAM: MRI HEAD WITHOUT CONTRAST TECHNIQUE: Multiplanar, multiecho pulse sequences of the brain and surrounding structures were obtained without intravenous contrast. COMPARISON:  Head CT from yesterday.  Brain MRI 04/09/2018 FINDINGS: Brain:  No acute infarction, hemorrhage, hydrocephalus, extra-axial collection or mass lesion. Small to moderate left frontal infarct that is remote. Mild periventricular chronic small vessel ischemia. Vascular: Grossly preserved major flow voids Skull and upper cervical spine: No evident marrow lesion Sinuses/Orbits: Right cataract resection. Other: Significantly and progressively motion degraded study which could obscure pathology. IMPRESSION: 1. Significant motion degradation but diagnostic diffusion imaging that is negative for acute infarct. 2. Remote left frontal infarct. Electronically Signed   By: Monte Fantasia M.D.   On: 04/20/2018 22:31   Labs: Basic Metabolic Panel: Recent Labs  Lab 04/17/18 0941 04/19/18 1541 04/19/18 1545 04/21/18 0632  NA 141 140  --  135  K 5.0 3.3*  --  3.6  CL 101 104  --  101  CO2 23 26  --  26  GLUCOSE 179* 172*  --  197*  BUN 10 15  --  6*  CREATININE 0.80 0.84  --  0.70  CALCIUM 9.4 8.6*  --  8.5*  MG  --   --  1.1* 1.4*   Liver Function Tests: Recent Labs  Lab 04/17/18 0941 04/19/18 1541  AST 8 11*  ALT 6 8  ALKPHOS 82 61  BILITOT 0.3 0.5  PROT 6.3 6.5  ALBUMIN 3.9 3.2*   CBC: Recent Labs  Lab 04/17/18 0941 04/19/18 1541 04/20/18 2030 04/21/18 0632  WBC 12.4* 13.0* 12.6* 11.8*  NEUTROABS 8.7* 9.4*  --   --  HGB 10.2* 9.6* 9.3* 9.5*  HCT 31.6* 30.8* 29.2* 30.2*  MCV 90 90.9 89.8 89.9  PLT 731* 669* 632* 609*   Cardiac Enzymes: Recent Labs  Lab 04/19/18 1541  TROPONINI <0.03    CBG: Recent Labs  Lab 04/20/18 2359 04/21/18 0635 04/21/18 1119 04/21/18 1539  GLUCAP 160* 187* 207* 161*    Principal Problem:   Partial seizure ([place]) Active Problems:   Hypertension associated with diabetes ([place])   DM type 2 with diabetic dyslipidemia ([facility])   Leukocytosis   Slurred speech   Benign essential HTN   Type 2 diabetes mellitus with hyperlipidemia ([place])   Time coordinating discharge: 35 minutes  Signed:  Murray Hodgkins,  MD [facility] 04/21/2018, 4:42 PM

## 2018-04-21 NOTE — Progress Notes (Signed)
PROGRESS NOTE  [name] UUV:[id number] DOB: 1941-02-25 DOA: 04/19/2018 PCP: [name], MD  Brief Narrative: 77 year old woman PMH lung cancer, presented with hand numbness and slurred speech lasting 15 to 20 minutes with complete resolution.  Recently admitted for suspected TIA with negative evaluation.  On presentation again with similar symptoms, initial neurology assessment was suspicious for seizures.  Assessment/Plan Slurred speech, bilateral hand numbness, suspicious for partial seizures.  Recurrent episodes since April without permanent deficit argue against TIA.  MRI brain, CT head no acute abnormalities.  Echocardiogram reassuring. --EEG showed normal wakefulness and drowsiness.  Occasional slowing noted of the right hemisphere may be artifactual.  No epileptiform activity noted. --Continue Keppra --Follow-up further neurology recommendations--home when cleared by neurology  Diabetes mellitus type 2 with diabetic neuropathy --CBG stable.  Essential hypertension --Stable.  Hyperlipidemia --Continue statin  Chronic leukocytosis, normocytic anemia, thrombocytosis.  Followed by hematology as an outpatient. --Stable.  No further inpatient evaluation suggested.  PMH lung cancer treated with radiation  Admitted [phone] for TIA with left-sided facial numbness, left-sided arm weakness and numbness.  MRI of the brain was negative.  Echocardiogram was unremarkable.  Aspirin was added to Plavix.  She was discharged home to follow-up with neurology in 4 weeks and a Holter monitor.  DVT prophylaxis: enoxaparin Code Status: Full Family Communication: daughter at bedside Disposition Plan: Home with home health PT   Murray Hodgkins, MD  [facility] Direct contact: [phone] --[place] [place]  --[email or web address]; password [email or web address]  7PM-7AM contact night coverage as above 04/21/2018, 10:58 AM  LOS: 1 day   Consultants:  Neurology   Procedures:  Echo Study  Conclusions  - Left ventricle: The cavity size was normal. Wall thickness was   increased in a pattern of mild LVH. Systolic function was normal.   The estimated ejection fraction was in the range of 60% to 65%.   Wall motion was normal; there were no regional wall motion   abnormalities. Findings consistent with left ventricular   diastolic dysfunction, grade indeterminate. Doppler parameters   are consistent with high ventricular filling pressure. - Aortic valve: Moderately calcified annulus. Mildly calcified   leaflets. There may be a mild degree of calcific aortic stenosis   but leaflet excursion is difficult to appreciate on short axis   views. - Right ventricle: The cavity size was mildly dilated. Systolic   function was mildly reduced. - Right atrium: The atrium was mildly dilated. - Systemic veins: The IVC is dilated with normal respiratory   variation. Estimated CVP 8 mmHg.  EEG IMPRESSION: This electroencephalogram shows normal wakefulness and drowse.   The occasional slowing noted over the right hemisphere may be artifactual.  Clinical correlation recommended.  No epileptiform activity is noted.    Antimicrobials:    Interval history/Subjective: Had a few spells yesterday.  No spells today.  Feels well.  No complaints. Hopes to go home.  Objective: Vitals:  Vitals:   04/21/18 0414 04/21/18 0738  BP: (!) 141/64 (!) 155/72  Pulse: 86 85  Resp: 15 16  Temp: 98.1 F (36.7 C) 98.2 F (36.8 C)  SpO2: 95% 95%    Exam:  Constitutional:  . Appears calm and comfortable Respiratory:  . CTA bilaterally, no w/r/r.  . Respiratory effort normal.  Cardiovascular:  . RRR, no m/r/g . No LE extremity edema   Musculoskeletal:  . RLE, LLE   . strength and tone normal Psychiatric:  . Mental status o Mood, affect appropriate  I have personally  reviewed the following:   Data: . I/O o UOP: void recorded o BM: none recorded  CBG: stable  Labs: BMP unremarkable.   Hemoglobin stable 9.5, platelets stable 609, WBC stable 11.8.  Urinalysis and urine drug screen were unremarkable.  Imaging: MRI brain and CT head noted  Other: EKG sinus rhythm, right bundle branch block, no significant change compared to last test 04/09/2018   Scheduled Meds: . aspirin  81 mg Oral Daily  . clopidogrel  75 mg Oral Daily  . enoxaparin (LOVENOX) injection  30 mg Subcutaneous Q24H  . insulin aspart  0-9 Units Subcutaneous TID WC  . levETIRAcetam  500 mg Oral BID  . magnesium oxide  400 mg Oral BID  . rosuvastatin  20 mg Oral QHS   Continuous Infusions: . sodium chloride 250 mL (04/20/18 0002)    Principal Problem:   Partial seizure ([place]) Active Problems:   Hypertension associated with diabetes ([place])   DM type 2 with diabetic dyslipidemia ([facility])   Leukocytosis   Slurred speech   Benign essential HTN   Type 2 diabetes mellitus with hyperlipidemia ([place])   LOS: 1 day

## 2018-04-21 NOTE — Progress Notes (Addendum)
Subjective: Patient is resting comfortably in her bed.  States that she had 4 episodes yesterday.  Majority of her episodes in her left hand.  They last for approximately 15 minutes.  However they do occur on the right side at times.  States that yesterday it did occur on the right side.  She is aware of her left sided stroke that occurred in 2014.  Exam: Vitals:   04/21/18 0414 04/21/18 0738  BP: (!) 141/64 (!) 155/72  Pulse: 86 85  Resp: 15 16  Temp: 98.1 F (36.7 C) 98.2 F (36.8 C)  SpO2: 95% 95%    Physical Exam   HEENT-  Normocephalic, no lesions, without obvious abnormality.  Normal external eye and conjunctiva.   Cardiovascular- S1-S2 audible, pulses palpable throughout   Lungs-no rhonchi or wheezing noted, no excessive working breathing.  Saturations within normal limits Abdomen- All 4 quadrants palpated and nontender Extremities- Warm, dry and intact Musculoskeletal-no joint tenderness, deformity or swelling Skin-warm and dry, no hyperpigmentation, vitiligo, or suspicious lesions    Neuro:  Mental Status: Alert, oriented, thought content appropriate.  Speech fluent without evidence of aphasia.  Able to follow 3 step commands without difficulty. Cranial Nerves: II:  Visual fields grossly normal,  III,IV, VI: ptosis not present, extra-ocular motions intact bilaterally pupils equal, round, reactive to light and accommodation V,VII: smile symmetric, facial light touch sensation normal bilaterally VIII: hearing normal bilaterally Motor: Moving all extremities antigravity Sensory: Pinprick and light touch intact throughout, bilaterally Plantars: Right: downgoing   Left: downgoing Cerebellar: normal finger-to-nose, normal rapid alternating movements and normal heel-to-shin test     Medications:  Scheduled: . aspirin  81 mg Oral Daily  . clopidogrel  75 mg Oral Daily  . enoxaparin (LOVENOX) injection  30 mg Subcutaneous Q24H  . insulin aspart  0-9 Units  Subcutaneous TID WC  . levETIRAcetam  500 mg Oral BID  . magnesium oxide  400 mg Oral BID  . rosuvastatin  20 mg Oral QHS   Continuous: . sodium chloride 250 mL (04/20/18 0002)    Pertinent Labs/Diagnostics: Glucose 197 BUN 6 Creatinine 0.7 Calcium 8.5  Ct Head Wo Contrast  Result Date: 04/19/2018 CLINICAL DATA:  TIA. Right-sided weakness today. No reported injury. EXAM: CT HEAD WITHOUT CONTRAST TECHNIQUE: Contiguous axial images were obtained from the base of the skull through the vertex without intravenous contrast. COMPARISON:  04/09/2018 head CT. FINDINGS: Brain: Stable left frontal encephalomalacia. No evidence of parenchymal hemorrhage or extra-axial fluid collection. No mass lesion, mass effect, or midline shift. No CT evidence of acute infarction. Generalized cerebral volume loss. Nonspecific mild subcortical and periventricular white matter hypodensity, most in keeping with chronic small vessel ischemic change. Cerebral ventricle sizes are stable and concordant with the degree of cerebral volume loss. Vascular: No acute abnormality. Skull: No evidence of calvarial fracture. Sinuses/Orbits: The visualized paranasal sinuses are essentially clear. Other:  The mastoid air cells are unopacified. IMPRESSION: 1.  No evidence of acute intracranial abnormality. 2. Stable left frontal encephalomalacia. 3. Mild chronic small vessel ischemic changes in the cerebral white matter. Electronically Signed   By: Ilona Sorrel M.D.   On: 04/19/2018 16:36   Mr Brain Wo Contrast  Result Date: 04/20/2018 CLINICAL DATA:  [place] TIA with bilateral hand numbness, right facial droop, and slurred speech. EXAM: MRI HEAD WITHOUT CONTRAST TECHNIQUE: Multiplanar, multiecho pulse sequences of the brain and surrounding structures were obtained without intravenous contrast. COMPARISON:  Head CT from yesterday.  Brain MRI 04/09/2018 FINDINGS: Brain:  No acute infarction, hemorrhage, hydrocephalus, extra-axial collection  or mass lesion. Small to moderate left frontal infarct that is remote. Mild periventricular chronic small vessel ischemia. Vascular: Grossly preserved major flow voids Skull and upper cervical spine: No evident marrow lesion Sinuses/Orbits: Right cataract resection. Other: Significantly and progressively motion degraded study which could obscure pathology. IMPRESSION: 1. Significant motion degradation but diagnostic diffusion imaging that is negative for acute infarct. 2. Remote left frontal infarct. Electronically Signed   By: Monte Fantasia M.D.   On: 04/20/2018 22:31     Etta Quill PA-C [facility] [phone]   Assessment: Bilateral hand numbness which is transient left greater than right.  Given that patient does have structural abnormality (prior stroke), although on left which doesn't explain left sided symptoms, but does make her susceptible for seizures and can explain the transient less frequent right sided symptoms.   For that reason I do agree with starting Keppra 500 mg twice daily.  At this point awaiting EEG  Impression: Possible seizure activity  Recommendations: 1) EEG 2) continue keppra 3) Follow up OP neurology - has an appt with Dr. Leonie Man later this month at [facility].  Attending Neurohospitalist Addendum Patient seen and examined with APP/Resident. Agree with the history and physical as documented above. Agree with the plan as documented, which I helped formulate. I have independently reviewed the chart, obtained history, review of systems and examined the patient.I have personally reviewed pertinent head/neck/spine imaging (CT/MRI). Remote left frontal stroke, no acute findings. Please feel free to call with any questions. --- Amie Portland, MD [facility] Pager: [phone]  If 7pm to 7am, please call on call as listed on AMION.  Addendum EEG completed.  Normal drowsy and wakeful EEG with possible occasional slowing over the right hemisphere  that might be artifactual. Recommendations as above-continue Keppra and follow-up with outpatient neurology. Please call neurology with questions

## 2018-04-21 NOTE — Telephone Encounter (Signed)
Patient's daughter called to cancel appointment for today. She is in the hospital [place]

## 2018-04-21 NOTE — Consult Note (Signed)
   [facility] Inpatient Consult   04/21/2018  [name] 17-Apr-1941 [id number]   Patient screened for potential [facility] Care Management services due to unplanned readmission risk score of 24% (high) and multiple hospitalizations.  Went to bedside to speak with [name] and daughter [name] about [place] Management services. [name] is agreeable and [facility] Care Management written consent obtained. [facility] folder provided.  Explained [facility] Care Management will not interfere or replace services provided by home health. [name] will have [place].   [name] lives with her husband and daughter. Patient's husband currently is under hospice care at home.  Discussed [facility] Care Management follow for DM education and management.   Confirmed Primary Care MD is Dr. Redmond School ([place] is listed as doing transition of care calls).   Confirmed best contact number for [name] as [phone].  [name] (daughter) cell number is [phone].  [name] states her copay for her DM oral medication is 45.00. States she could use some assistance if possible. States between hospital bills and mediation copays she is left with little money. Agreeable to [place] team referral.  [name] has a history of DM with a  HgbA1c of 9.4, HLD, HTN, lung cancer, and CVA.   Will make referral to [place] and [facility] Pharmacist.  Will make inpatient RNCM aware [facility] will follow post discharge.   Marthenia Rolling, MSN-Ed, RN,BSN [facility] Liaison [phone]

## 2018-04-21 NOTE — Plan of Care (Signed)
  Problem: Ischemic Stroke/TIA Tissue Perfusion: Goal: Complications of ischemic stroke/TIA will be minimized Outcome: Progressing   Problem: Education: Goal: Knowledge of disease or condition will improve Outcome: Progressing   Problem: Clinical Measurements: Goal: Ability to maintain clinical measurements within normal limits will improve Outcome: Progressing

## 2018-04-21 NOTE — Care Management Note (Signed)
Case Management Note  Patient Details  Name: [name] MRN: [id number] Date of Birth: Apr 14, 1941  Subjective/Objective:    Pt in with slurred speech. She is from home with spouse. Pt has: walker and cane.  Pt denies any issues with obtaining her medications. She also states she has no issues with transportation.                Action/Plan: Pt with orders for [facility] services. CM provided choice and she selected [place]. Butch Penny with [facility] notified and accepted the referral.  Family to provide supervision at home and transportation to home.  Expected Discharge Date:                  Expected Discharge Plan:  [name]  In-House Referral:     Discharge planning Services  CM Consult  Post Acute Care Choice:  Home Health Choice offered to:  Patient, Adult Children  DME Arranged:    DME Agency:     HH Arranged:  PT, OT HH Agency:  [place]  Status of Service:  Completed, signed off  If discussed at [place] of Stay Meetings, dates discussed:    Additional Comments:  Pollie Friar, RN 04/21/2018, 3:52 PM

## 2018-04-21 NOTE — Plan of Care (Signed)
Adequate for discharge.

## 2018-04-23 ENCOUNTER — Other Ambulatory Visit: Payer: Self-pay

## 2018-04-23 ENCOUNTER — Ambulatory Visit: Payer: Self-pay | Admitting: Pharmacist

## 2018-04-23 ENCOUNTER — Other Ambulatory Visit: Payer: Self-pay | Admitting: Pharmacist

## 2018-04-23 DIAGNOSIS — E1169 Type 2 diabetes mellitus with other specified complication: Secondary | ICD-10-CM | POA: Diagnosis not present

## 2018-04-23 DIAGNOSIS — D649 Anemia, unspecified: Secondary | ICD-10-CM | POA: Diagnosis not present

## 2018-04-23 DIAGNOSIS — I152 Hypertension secondary to endocrine disorders: Secondary | ICD-10-CM | POA: Diagnosis not present

## 2018-04-23 DIAGNOSIS — E1140 Type 2 diabetes mellitus with diabetic neuropathy, unspecified: Secondary | ICD-10-CM | POA: Diagnosis not present

## 2018-04-23 DIAGNOSIS — I69354 Hemiplegia and hemiparesis following cerebral infarction affecting left non-dominant side: Secondary | ICD-10-CM | POA: Diagnosis not present

## 2018-04-23 DIAGNOSIS — Z7982 Long term (current) use of aspirin: Secondary | ICD-10-CM | POA: Diagnosis not present

## 2018-04-23 DIAGNOSIS — E785 Hyperlipidemia, unspecified: Secondary | ICD-10-CM | POA: Diagnosis not present

## 2018-04-23 DIAGNOSIS — R4781 Slurred speech: Secondary | ICD-10-CM | POA: Diagnosis not present

## 2018-04-23 DIAGNOSIS — I10 Essential (primary) hypertension: Secondary | ICD-10-CM | POA: Diagnosis not present

## 2018-04-23 DIAGNOSIS — Z7902 Long term (current) use of antithrombotics/antiplatelets: Secondary | ICD-10-CM | POA: Diagnosis not present

## 2018-04-23 DIAGNOSIS — I69992 Facial weakness following unspecified cerebrovascular disease: Secondary | ICD-10-CM | POA: Diagnosis not present

## 2018-04-23 NOTE — Patient Outreach (Signed)
[facility] [facility]) Care Management  04/23/2018  [name] 11-21-1940 [id number]   [facility]) Care Management  [place]  04/23/2018  [name] 29-Nov-1940 [id number]   Reason for referral: medication assistance  Unsuccessful telephone call attempt #1 to patient.   -HIPAA compliant voicemail left requesting a return call.  -Message left with daughter [name] who is DPR)  requesting a return call.    Plan:  -I will mail patient an unsuccessful outreach letter.   -I will make another outreach attempt to patient within 3-4 business days.    Regina Eck, PharmD, [place]  [phone]

## 2018-04-23 NOTE — Patient Outreach (Signed)
[facility]) Care Management  04/23/2018  [name] 1941/01/13 [id number]   Referral received from [place] for complex case management and diabetes management.  Successful outreach with [name].  She reported compliance with medications and glucose monitoring, and is attempting to  follow a diabetic diet. No falls reported. She confirmed outreach from [employer]. She reported a scheduled follow up with her Oncology provider on tomorrow and pending follow up with her PCP.  Discussed [facility] services. Referral for [facility] Pharmacist was placed prior to hospital discharge. She declined need for [facility] SW services at this time. Agreeable to further outreach with [facility] and home visit on next week.   PLAN Will follow up on next week.   [place] [phone]

## 2018-04-24 ENCOUNTER — Encounter: Payer: Self-pay | Admitting: Radiation Oncology

## 2018-04-24 ENCOUNTER — Ambulatory Visit
Admission: RE | Admit: 2018-04-24 | Discharge: 2018-04-24 | Disposition: A | Discharge status: Home / Self Care | Payer: Medicare Other | Source: Ambulatory Visit | Attending: Radiation Oncology | Admitting: Radiation Oncology

## 2018-04-24 ENCOUNTER — Other Ambulatory Visit: Payer: Self-pay

## 2018-04-24 ENCOUNTER — Telehealth: Payer: Self-pay | Admitting: Family Medicine

## 2018-04-24 VITALS — BP 163/78 | HR 87 | Temp 97.7°F | Resp 18 | Ht 66.0 in | Wt 134.8 lb

## 2018-04-24 DIAGNOSIS — Z888 Allergy status to other drugs, medicaments and biological substances status: Secondary | ICD-10-CM | POA: Insufficient documentation

## 2018-04-24 DIAGNOSIS — Z885 Allergy status to narcotic agent status: Secondary | ICD-10-CM | POA: Diagnosis not present

## 2018-04-24 DIAGNOSIS — Z79899 Other long term (current) drug therapy: Secondary | ICD-10-CM | POA: Diagnosis not present

## 2018-04-24 DIAGNOSIS — Z7902 Long term (current) use of antithrombotics/antiplatelets: Secondary | ICD-10-CM | POA: Insufficient documentation

## 2018-04-24 DIAGNOSIS — C3411 Malignant neoplasm of upper lobe, right bronchus or lung: Secondary | ICD-10-CM | POA: Diagnosis not present

## 2018-04-24 DIAGNOSIS — Z7984 Long term (current) use of oral hypoglycemic drugs: Secondary | ICD-10-CM | POA: Insufficient documentation

## 2018-04-24 DIAGNOSIS — Z7982 Long term (current) use of aspirin: Secondary | ICD-10-CM | POA: Diagnosis not present

## 2018-04-24 DIAGNOSIS — C3491 Malignant neoplasm of unspecified part of right bronchus or lung: Secondary | ICD-10-CM | POA: Diagnosis present

## 2018-04-24 HISTORY — DX: Unspecified convulsions: R56.9

## 2018-04-24 NOTE — Telephone Encounter (Addendum)
Stacey @ [facility] called to let Dr. Redmond School know that she completed a home evaluation on pt and she does not show a need for home therapy now.Erline Levine can be reached at [phone].

## 2018-04-24 NOTE — Progress Notes (Signed)
Pt presents today for f/u appt with Dr. Sondra Come. Pt is accompanied by daughter. Pt was recently hospitalized for suspected seizures. Pt reports feeling "run down". Pt denies difficulty swallowing. Pt denies SOB. Pt reports cough with white sputum production, no hemoptysis. Pt denies c/o pain.   BP (!) 163/78 (BP Location: Left Arm, Patient Position: Sitting)   Pulse 87   Temp 97.7 F (36.5 C) (Oral)   Resp 18   Ht 5\' 6"  (1.676 m)   Wt 134 lb 12.8 oz (61.1 kg)   SpO2 100%   BMI 21.76 kg/m   Wt Readings from Last 3 Encounters:  04/24/18 134 lb 12.8 oz (61.1 kg)  04/19/18 136 lb 11 oz (62 kg)  04/17/18 137 lb 12.8 oz (62.5 kg)   Loma Sousa, RN BSN

## 2018-04-24 NOTE — Progress Notes (Signed)
Radiation Oncology         [phone] ________________________________  Name: [name] MRN: [id number]  Date: 04/24/2018  DOB: 05/07/1941  Follow-Up Visit Note  CC: [name], MD  Hosie Poisson, MD    ICD-10-CM   1. Non-small cell cancer of right lung [facility]) C34.91     Diagnosis: Stage IIA (cT2b, cN0, cM0) non-small cell lung cancer  (squamous cell carcinoma) presenting in the right upper lobe lung     Interval Since Last Radiation:  10 months 06/11/17 - 06/21/17: Right Lung / 50 Gy in 5 fx  Narrative:  The patient returns today for routine follow-up. She is accompanied by her daughter. She was seen on  03/10/18 by me for re-consultation concerning questionable disease progression, but a decision was deferred due to recent pneumonia. Since then, she has been in the hospital for slurred speech and bilateral hand numbness. These were questionable for stroke, but tests show suspicion for partial seizures. She reports wearing a heart monitor since 04/18/18.   She reports not feeling well, noting she may have caught a cold. She reports a cough with white sputum production but denied hemoptysis. She notes not being on oxygen at home and denies any issues with her breathing.                   ALLERGIES:  is allergic to codeine; lipitor [atorvastatin]; and phenergan [promethazine hcl].  Meds: Current Outpatient Medications  Medication Sig Dispense Refill  . acetaminophen (TYLENOL) 325 MG tablet Take 325 mg by mouth every 6 (six) hours as needed for mild pain.     Marland Kitchen aspirin EC 81 MG tablet Take 81 mg by mouth daily.    . Blood Glucose Monitoring Suppl (ONE TOUCH ULTRA 2) w/Device KIT 1 Device by Does not apply route 2 (two) times daily. 1 each 0  . clopidogrel (PLAVIX) 75 MG tablet TAKE 1 TABLET BY MOUTH  DAILY (Patient taking differently: Take 75 mg by mouth daily. ) 90 tablet 3  . glucose blood (ONE TOUCH ULTRA TEST) test strip Use as instructed 100 each 12  . Lancets  (ONETOUCH ULTRASOFT) lancets Use as instructed 100 each 12  . levETIRAcetam (KEPPRA) 500 MG tablet Take 1 tablet (500 mg total) by mouth 2 (two) times daily. 60 tablet 0  . magnesium oxide (MAG-OX) 400 (241.3 Mg) MG tablet Take 1 tablet (400 mg total) by mouth 2 (two) times daily. 60 tablet 0  . Multiple Vitamin (MULTIVITAMIN WITH MINERALS) TABS tablet Take 1 tablet by mouth daily. 30 tablet   . rosuvastatin (CRESTOR) 20 MG tablet Take 1 tablet (20 mg total) by mouth daily. (Patient taking differently: Take 20 mg by mouth at bedtime. ) 90 tablet 3  . sitaGLIPtin-metformin (JANUMET) 50-1000 MG tablet Take 1 tablet by mouth 2 (two) times daily. (Patient taking differently: Take 1 tablet by mouth 2 (two) times daily before a meal. ) 120 tablet 0  . triamcinolone (NASACORT) 55 MCG/ACT AERO nasal inhaler Place 1 spray into the nose daily. (Patient not taking: Reported on 04/24/2018) 1 Inhaler 0   No current facility-administered medications for this encounter.     Physical Findings: The patient is in no acute distress. Patient is alert and oriented.  height is '5\' 6"'  (1.676 m) and weight is 134 lb 12.8 oz (61.1 kg). Her oral temperature is 97.7 F (36.5 C). Her blood pressure is 163/78 (abnormal) and her pulse is 87. Her respiration is 18 and oxygen  saturation is 100%. .   Lungs are clear to auscultation bilaterally. Heart has regular rate and rhythm. No palpable cervical, supraclavicular, or axillary adenopathy. Abdomen soft, non-tender, normal bowel sounds. She has a heart monitor in place.   Lab Findings: Lab Results  Component Value Date   WBC 11.8 (H) 04/21/2018   HGB 9.5 (L) 04/21/2018   HCT 30.2 (L) 04/21/2018   MCV 89.9 04/21/2018   PLT 609 (H) 04/21/2018    Radiographic Findings: Ct Angio Head W Or Wo Contrast  Result Date: 04/09/2018 CLINICAL DATA:  Left facial droop and left arm numbness. EXAM: CT ANGIOGRAPHY HEAD AND NECK TECHNIQUE: Multidetector CT imaging of the head and neck  was performed using the standard protocol during bolus administration of intravenous contrast. Multiplanar CT image reconstructions and MIPs were obtained to evaluate the vascular anatomy. Carotid stenosis measurements (when applicable) are obtained utilizing NASCET criteria, using the distal internal carotid diameter as the denominator. CONTRAST:  82m ISOVUE-370 IOPAMIDOL (ISOVUE-370) INJECTION 76% COMPARISON:  None. FINDINGS: CT HEAD FINDINGS Brain: There is no mass, hemorrhage or extra-axial collection. There is generalized atrophy without lobar predilection. There is an old left frontal lobe infarct. There is hypoattenuation of the periventricular white matter, most commonly indicating chronic ischemic microangiopathy. Skull: The visualized skull base, calvarium and extracranial soft tissues are normal. Sinuses/Orbits: No fluid levels or advanced mucosal thickening of the visualized paranasal sinuses. No mastoid or middle ear effusion. The orbits are normal. CTA NECK FINDINGS AORTIC ARCH: There is mild calcific atherosclerosis of the aortic arch. There is no aneurysm, dissection or hemodynamically significant stenosis of the visualized ascending aorta and aortic arch. Conventional 3 vessel aortic branching pattern. The visualized proximal subclavian arteries are widely patent. RIGHT CAROTID SYSTEM: --Common carotid artery: Widely patent origin without common carotid artery dissection or aneurysm. --Internal carotid artery: No dissection, occlusion or aneurysm. No hemodynamically significant stenosis. --External carotid artery: No acute abnormality. LEFT CAROTID SYSTEM: --Common carotid artery: Widely patent origin without common carotid artery dissection or aneurysm. --Internal carotid artery:There is irregular fibrofatty plaque within the distal left common carotid artery, extending into the proximal left internal carotid artery. There is no hemodynamically significant stenosis. --External carotid artery: No  acute abnormality. VERTEBRAL ARTERIES: Codominant configuration. Both origins are normal. No dissection, occlusion or flow-limiting stenosis to the vertebrobasilar confluence. SKELETON: There is no bony spinal canal stenosis. No lytic or blastic lesion. OTHER NECK: Normal pharynx, larynx and major salivary glands. No cervical lymphadenopathy. Unremarkable thyroid gland. UPPER CHEST: Right upper lobe mass is incompletely visualized, but appears unchanged compared to the chest CT of 02/27/2018. Small right pleural effusion. CTA HEAD FINDINGS ANTERIOR CIRCULATION: --Intracranial internal carotid arteries: Normal. --Anterior cerebral arteries: Normal. Both A1 segments are present. Patent anterior communicating artery. --Middle cerebral arteries: Normal. --Posterior communicating arteries: Absent bilaterally. POSTERIOR CIRCULATION: --Basilar artery: Normal. --Posterior cerebral arteries: Normal. --Superior cerebellar arteries: Normal. --Inferior cerebellar arteries: Normal anterior and posterior inferior cerebellar arteries. VENOUS SINUSES: As permitted by contrast timing, patent. ANATOMIC VARIANTS: None DELAYED PHASE: No parenchymal contrast enhancement. Review of the MIP images confirms the above findings. IMPRESSION: 1. No emergent large vessel occlusion or hemodynamically significant intracranial stenosis. 2. Irregular fibrofatty plaque within the distal left common carotid artery and proximal left internal carotid artery, without hemodynamically significant stenosis. This appearance may be associated with an increased risk of plaque rupture. 3. Incompletely visualized right upper lobe mass. 4.  Aortic Atherosclerosis (ICD10-I70.0). Electronically Signed   By: KUlyses JarredM.D.   On:  04/09/2018 19:55   Dg Chest 2 View  Result Date: 04/09/2018 CLINICAL DATA:  Left facial droop, left hand numbness. EXAM: CHEST - 2 VIEW COMPARISON:  Radiographs of March 30, 2018. CT scan of February 27, 2018. FINDINGS: The heart  size and mediastinal contours are within normal limits. No pneumothorax or pleural effusion is noted. Left lung is clear. Stable right upper lobe lung mass is noted consistent with malignancy. The visualized skeletal structures are unremarkable. IMPRESSION: Stable right upper lobe lung mass is noted consistent with malignancy. No significant changes noted compared to prior exam. Electronically Signed   By: Marijo Conception, M.D.   On: 04/09/2018 16:53   Dg Chest 2 View  Result Date: 03/30/2018 CLINICAL DATA:  Cough EXAM: CHEST - 2 VIEW COMPARISON:  CT 02/27/2018, radiograph 02/27/2018 FINDINGS: A right upper lobe lung mass appears increased in size and more consolidated. There are adjacent pleural changes. Stable cardiomediastinal silhouette with aortic atherosclerosis. No pneumothorax. There are degenerative changes of the spine. IMPRESSION: Increased size and density of right upper lobe lung mass with adjacent pleural changes as compared with radiograph 02/27/2018. Otherwise no significant interval change. Electronically Signed   By: Donavan Foil M.D.   On: 03/30/2018 19:41   Ct Head Wo Contrast  Result Date: 04/19/2018 CLINICAL DATA:  TIA. Right-sided weakness today. No reported injury. EXAM: CT HEAD WITHOUT CONTRAST TECHNIQUE: Contiguous axial images were obtained from the base of the skull through the vertex without intravenous contrast. COMPARISON:  04/09/2018 head CT. FINDINGS: Brain: Stable left frontal encephalomalacia. No evidence of parenchymal hemorrhage or extra-axial fluid collection. No mass lesion, mass effect, or midline shift. No CT evidence of acute infarction. Generalized cerebral volume loss. Nonspecific mild subcortical and periventricular white matter hypodensity, most in keeping with chronic small vessel ischemic change. Cerebral ventricle sizes are stable and concordant with the degree of cerebral volume loss. Vascular: No acute abnormality. Skull: No evidence of calvarial fracture.  Sinuses/Orbits: The visualized paranasal sinuses are essentially clear. Other:  The mastoid air cells are unopacified. IMPRESSION: 1.  No evidence of acute intracranial abnormality. 2. Stable left frontal encephalomalacia. 3. Mild chronic small vessel ischemic changes in the cerebral white matter. Electronically Signed   By: Ilona Sorrel M.D.   On: 04/19/2018 16:36   Ct Angio Neck W Or Wo Contrast  Result Date: 04/09/2018 CLINICAL DATA:  Left facial droop and left arm numbness. EXAM: CT ANGIOGRAPHY HEAD AND NECK TECHNIQUE: Multidetector CT imaging of the head and neck was performed using the standard protocol during bolus administration of intravenous contrast. Multiplanar CT image reconstructions and MIPs were obtained to evaluate the vascular anatomy. Carotid stenosis measurements (when applicable) are obtained utilizing NASCET criteria, using the distal internal carotid diameter as the denominator. CONTRAST:  43m ISOVUE-370 IOPAMIDOL (ISOVUE-370) INJECTION 76% COMPARISON:  None. FINDINGS: CT HEAD FINDINGS Brain: There is no mass, hemorrhage or extra-axial collection. There is generalized atrophy without lobar predilection. There is an old left frontal lobe infarct. There is hypoattenuation of the periventricular white matter, most commonly indicating chronic ischemic microangiopathy. Skull: The visualized skull base, calvarium and extracranial soft tissues are normal. Sinuses/Orbits: No fluid levels or advanced mucosal thickening of the visualized paranasal sinuses. No mastoid or middle ear effusion. The orbits are normal. CTA NECK FINDINGS AORTIC ARCH: There is mild calcific atherosclerosis of the aortic arch. There is no aneurysm, dissection or hemodynamically significant stenosis of the visualized ascending aorta and aortic arch. Conventional 3 vessel aortic branching pattern. The visualized  proximal subclavian arteries are widely patent. RIGHT CAROTID SYSTEM: --Common carotid artery: Widely patent origin  without common carotid artery dissection or aneurysm. --Internal carotid artery: No dissection, occlusion or aneurysm. No hemodynamically significant stenosis. --External carotid artery: No acute abnormality. LEFT CAROTID SYSTEM: --Common carotid artery: Widely patent origin without common carotid artery dissection or aneurysm. --Internal carotid artery:There is irregular fibrofatty plaque within the distal left common carotid artery, extending into the proximal left internal carotid artery. There is no hemodynamically significant stenosis. --External carotid artery: No acute abnormality. VERTEBRAL ARTERIES: Codominant configuration. Both origins are normal. No dissection, occlusion or flow-limiting stenosis to the vertebrobasilar confluence. SKELETON: There is no bony spinal canal stenosis. No lytic or blastic lesion. OTHER NECK: Normal pharynx, larynx and major salivary glands. No cervical lymphadenopathy. Unremarkable thyroid gland. UPPER CHEST: Right upper lobe mass is incompletely visualized, but appears unchanged compared to the chest CT of 02/27/2018. Small right pleural effusion. CTA HEAD FINDINGS ANTERIOR CIRCULATION: --Intracranial internal carotid arteries: Normal. --Anterior cerebral arteries: Normal. Both A1 segments are present. Patent anterior communicating artery. --Middle cerebral arteries: Normal. --Posterior communicating arteries: Absent bilaterally. POSTERIOR CIRCULATION: --Basilar artery: Normal. --Posterior cerebral arteries: Normal. --Superior cerebellar arteries: Normal. --Inferior cerebellar arteries: Normal anterior and posterior inferior cerebellar arteries. VENOUS SINUSES: As permitted by contrast timing, patent. ANATOMIC VARIANTS: None DELAYED PHASE: No parenchymal contrast enhancement. Review of the MIP images confirms the above findings. IMPRESSION: 1. No emergent large vessel occlusion or hemodynamically significant intracranial stenosis. 2. Irregular fibrofatty plaque within the  distal left common carotid artery and proximal left internal carotid artery, without hemodynamically significant stenosis. This appearance may be associated with an increased risk of plaque rupture. 3. Incompletely visualized right upper lobe mass. 4.  Aortic Atherosclerosis (ICD10-I70.0). Electronically Signed   By: Ulyses Jarred M.D.   On: 04/09/2018 19:55   Mr Brain Wo Contrast  Result Date: 04/20/2018 CLINICAL DATA:  [place] TIA with bilateral hand numbness, right facial droop, and slurred speech. EXAM: MRI HEAD WITHOUT CONTRAST TECHNIQUE: Multiplanar, multiecho pulse sequences of the brain and surrounding structures were obtained without intravenous contrast. COMPARISON:  Head CT from yesterday.  Brain MRI 04/09/2018 FINDINGS: Brain: No acute infarction, hemorrhage, hydrocephalus, extra-axial collection or mass lesion. Small to moderate left frontal infarct that is remote. Mild periventricular chronic small vessel ischemia. Vascular: Grossly preserved major flow voids Skull and upper cervical spine: No evident marrow lesion Sinuses/Orbits: Right cataract resection. Other: Significantly and progressively motion degraded study which could obscure pathology. IMPRESSION: 1. Significant motion degradation but diagnostic diffusion imaging that is negative for acute infarct. 2. Remote left frontal infarct. Electronically Signed   By: Monte Fantasia M.D.   On: 04/20/2018 22:31   Mr Brain Wo Contrast (neuro Protocol)  Result Date: 04/09/2018 CLINICAL DATA:  Transient ischemic attack EXAM: MRI HEAD WITHOUT CONTRAST TECHNIQUE: Multiplanar, multiecho pulse sequences of the brain and surrounding structures were obtained without intravenous contrast. COMPARISON:  Brain MRI 03/30/2018 FINDINGS: BRAIN: There is no acute infarct, acute hemorrhage or mass effect. The midline structures are normal. There is an old left frontal infarct. Multifocal white matter hyperintensity, most commonly due to chronic ischemic  microangiopathy. Generalized atrophy without lobar predilection. Susceptibility-sensitive sequences show no chronic microhemorrhage or superficial siderosis. VASCULAR: Major intracranial arterial and venous sinus flow voids are preserved. SKULL AND UPPER CERVICAL SPINE: The visualized skull base, calvarium, upper cervical spine and extracranial soft tissues are normal. SINUSES/ORBITS: No fluid levels or advanced mucosal thickening. No mastoid or middle ear effusion. The orbits  are normal. IMPRESSION: 1. Atrophy and chronic small vessel disease without acute intracranial abnormality. 2. Old left frontal infarct. Electronically Signed   By: Ulyses Jarred M.D.   On: 04/09/2018 17:35   Mr Brain Wo Contrast  Result Date: 03/30/2018 CLINICAL DATA:  Left hand weakness and numbness beginning 1630 hours. Temporal headache. EXAM: MRI HEAD WITHOUT CONTRAST TECHNIQUE: Multiplanar, multiecho pulse sequences of the brain and surrounding structures were obtained without intravenous contrast. COMPARISON:  02/28/2018 FINDINGS: Brain: Diffusion imaging does not show any acute or subacute infarction. No focal insult affects the brainstem or cerebellum there is generalized atrophy. Cerebral hemispheres show generalized atrophy with an old left frontal cortical and subcortical infarction. There are old small vessel infarctions within the deep and subcortical white matter. No evidence of mass lesion, hemorrhage, hydrocephalus or extra-axial collection. Vascular: Major vessels at the base of the brain show flow. Skull and upper cervical spine: Negative Sinuses/Orbits: Mucosal inflammatory changes of the right maxillary sinus. Other sinuses are clear. Orbits are negative. Other: None IMPRESSION: No acute or subacute finding by MRI. Generalized atrophy. Old left frontal cortical and subcortical infarction. Chronic small-vessel ischemic changes of the hemispheric white matter. Electronically Signed   By: Nelson Chimes M.D.   On:  03/30/2018 19:26    Impression:   Stage IIA (cT2b, cN0, cM0) non-small cell lung cancer  (squamous cell carcinoma) presenting in the right upper lobe lung. Patient is now ready to proceed with the PET scan for further evaluation of the changes in the right upper lung.    Plan:  Patient will follow-up in 3 months. PET scan will be ordered for first available, and I will call her with the results. ____________________________________  Blair Promise, PhD, MD  This document serves as a record of services personally performed by Gery Pray, MD. It was created on his behalf by Wilburn Mylar, a trained medical scribe. The creation of this record is based on the scribe's personal observations and the provider's statements to them. This document has been checked and approved by the attending provider.

## 2018-04-25 ENCOUNTER — Ambulatory Visit: Payer: Self-pay | Admitting: Pharmacist

## 2018-04-30 ENCOUNTER — Other Ambulatory Visit: Payer: Self-pay | Admitting: Family Medicine

## 2018-04-30 DIAGNOSIS — E785 Hyperlipidemia, unspecified: Secondary | ICD-10-CM

## 2018-04-30 DIAGNOSIS — E1169 Type 2 diabetes mellitus with other specified complication: Secondary | ICD-10-CM

## 2018-05-02 ENCOUNTER — Other Ambulatory Visit: Payer: Self-pay

## 2018-05-02 NOTE — Patient Outreach (Signed)
[facility]) Care Management   05/02/2018  [name] [birth year] [id number]  [name] is an 77 y.o. female  Subjective:  Initial home visit complete. Member alert and oriented x 3. No complaints of pain.  Objective:  BP (!) 150/78 (BP Location: Left Arm, Patient Position: Sitting, Cuff Size: Normal)   Pulse 88   Resp 18   Ht 1.676 m (_0 )   Wt 133 lb (60.3 kg)   SpO2 96%   BMI 21.47 kg/m    Review of Systems  Constitutional: Negative.   Eyes: Negative.   Respiratory: Positive for cough and sputum production.        Reported clear sputum.  Cardiovascular: Negative.   Gastrointestinal: Negative.   Genitourinary: Negative.   Musculoskeletal: Negative.   Skin: Negative.     Physical Exam  Constitutional: She is oriented to person, place, and time.  Cardiovascular: Normal rate.  Respiratory: Effort normal.  GI: Soft. Bowel sounds are normal.  Neurological: She is alert and oriented to person, place, and time.  Skin: Skin is warm and dry.  Psychiatric: She has a normal mood and affect. Her behavior is normal. Judgment and thought content normal.    Encounter Medications:   Outpatient Encounter Medications as of 05/02/2018  Medication Sig  . acetaminophen (TYLENOL) 325 MG tablet Take 325 mg by mouth every 6 (six) hours as needed for mild pain.   Marland Kitchen aspirin EC 81 MG tablet Take 81 mg by mouth daily.  . Blood Glucose Monitoring Suppl (ONE TOUCH ULTRA 2) w/Device KIT 1 Device by Does not apply route 2 (two) times daily.  . clopidogrel (PLAVIX) 75 MG tablet TAKE 1 TABLET BY MOUTH  DAILY (Patient taking differently: Take 75 mg by mouth daily. )  . glucose blood (ONE TOUCH ULTRA TEST) test strip Use as instructed  . Lancets (ONETOUCH ULTRASOFT) lancets Use as instructed  . levETIRAcetam (KEPPRA) 500 MG tablet Take 1 tablet (500 mg total) by mouth 2 (two) times daily.  . magnesium oxide (MAG-OX) 400 (241.3 Mg) MG tablet Take 1 tablet (400 mg  total) by mouth 2 (two) times daily.  . Multiple Vitamin (MULTIVITAMIN WITH MINERALS) TABS tablet Take 1 tablet by mouth daily.  . rosuvastatin (CRESTOR) 20 MG tablet TAKE 1 TABLET BY MOUTH ONCE DAILY  . sitaGLIPtin-metformin (JANUMET) 50-1000 MG tablet Take 1 tablet by mouth 2 (two) times daily. (Patient taking differently: Take 1 tablet by mouth 2 (two) times daily before a meal. )  . triamcinolone (NASACORT) 55 MCG/ACT AERO nasal inhaler Place 1 spray into the nose daily.   No facility-administered encounter medications on file as of 05/02/2018.     Functional Status:   In your present state of health, do you have any difficulty performing the following activities: 05/02/2018 04/20/2018  Hearing? N N  Vision? N N  Difficulty concentrating or making decisions? N N  Walking or climbing stairs? N N  Dressing or bathing? N N  Doing errands, shopping? Y N  Comment Due to decrease energy. Needs assistance with errands. -  Preparing Food and eating ? Y -  Comment Due to fatigued. -  Using the Toilet? N -  In the past six months, have you accidently leaked urine? N -  Do you have problems with loss of bowel control? N -  Managing your Medications? N -  Managing your Finances? Y -  Comment Assisted by [name] (daughter) -  Housekeeping or managing your Housekeeping? Y -  Comment Assisted by [name] (daughter) -  Some recent data might be hidden    Fall/Depression Screening:    Fall Risk  05/02/2018 04/24/2018 03/10/2018  Falls in the past year? No No No   PHQ 2/9 Scores 05/02/2018 04/24/2018 03/10/2018 07/22/2017 07/04/2017 05/28/2017 05/13/2017  PHQ - 2 Score 0 0 0 0 0 0 0    Assessment:   Initial home visit complete. [name] reported doing well but fatigued due to assisting with terminally ill spouse. Denied falls or episodes of weakness since last discharge.  Confirmed outreach for therapy services with [facility]. She reported compliance with medications and glucose monitoring  but smokes daily. She did not want to enroll in smoking cessation counseling but agreed to consider options and notify [facility] regarding referrals.   Elevated BP noted during assessment. Per [name] and her daughter, it has slowly increased since discontinuation of her previous BP medication. PCP notified, and follow up appointment arranged for an earlier date. She will be evaluated on Monday 05/05/18.    THN CM Care Plan Problem One     Most Recent Value  Care Plan Problem One  Risk for Rehospitalization  Role Documenting the Problem One  Care Management Coordinator  Care Plan for Problem One  Active  THN Long Term Goal   Over the next 60 days, patient will not be readmitted for complications related to chronic disease management.  THN Long Term Goal Start Date  05/02/18  Interventions for Problem One Long Term Goal  Discussed medications, nutrition and smoking cessation. Discussed importance of attending all recommended MD follow ups.  THN CM Short Term Goal #1   Over the next 30 days, patient will take all medications as prescribed.  THN CM Short Term Goal #1 Start Date  05/02/18  Interventions for Short Term Goal #1  Reviewed medications and discussed importance of taking medications as prescribed.   THN CM Short Term Goal #2   Over the next 30 days patient will attend all scheduled MD appointments.  THN CM Short Term Goal #2 Start Date  05/02/18  Interventions for Short Term Goal #2  Discussed importance of attending all scheduled specialty appointments and following up with PCP after hospitalization.  THN CM Short Term Goal #3  Over the next 30 days patient will verbalize a plan related to smoking cessation.  THN CM Short Term Goal #3 Start Date  05/02/18  Interventions for Short Tern Goal #3  Educated patient regarding the negative effects of smoking. Provided information regarding smoking cessation counseling. [Patient reported smoking daily.]      PLAN Will follow up on next  week.   [place] [phone]

## 2018-05-04 ENCOUNTER — Other Ambulatory Visit: Payer: Self-pay

## 2018-05-04 ENCOUNTER — Emergency Department (HOSPITAL_COMMUNITY)
Admission: EM | Admit: 2018-05-04 | Discharge: 2018-05-05 | Disposition: A | Discharge status: Home / Self Care | Payer: Medicare Other | Attending: Emergency Medicine | Admitting: Emergency Medicine

## 2018-05-04 ENCOUNTER — Encounter (HOSPITAL_COMMUNITY): Payer: Self-pay | Admitting: *Deleted

## 2018-05-04 ENCOUNTER — Emergency Department (HOSPITAL_COMMUNITY): Payer: Medicare Other

## 2018-05-04 DIAGNOSIS — H538 Other visual disturbances: Secondary | ICD-10-CM | POA: Diagnosis not present

## 2018-05-04 DIAGNOSIS — I10 Essential (primary) hypertension: Secondary | ICD-10-CM | POA: Diagnosis not present

## 2018-05-04 DIAGNOSIS — Z7984 Long term (current) use of oral hypoglycemic drugs: Secondary | ICD-10-CM | POA: Insufficient documentation

## 2018-05-04 DIAGNOSIS — F17210 Nicotine dependence, cigarettes, uncomplicated: Secondary | ICD-10-CM | POA: Diagnosis not present

## 2018-05-04 DIAGNOSIS — Z85118 Personal history of other malignant neoplasm of bronchus and lung: Secondary | ICD-10-CM | POA: Diagnosis not present

## 2018-05-04 DIAGNOSIS — Z7902 Long term (current) use of antithrombotics/antiplatelets: Secondary | ICD-10-CM | POA: Insufficient documentation

## 2018-05-04 DIAGNOSIS — Z8673 Personal history of transient ischemic attack (TIA), and cerebral infarction without residual deficits: Secondary | ICD-10-CM | POA: Diagnosis not present

## 2018-05-04 DIAGNOSIS — R202 Paresthesia of skin: Secondary | ICD-10-CM | POA: Diagnosis not present

## 2018-05-04 DIAGNOSIS — E119 Type 2 diabetes mellitus without complications: Secondary | ICD-10-CM | POA: Diagnosis not present

## 2018-05-04 DIAGNOSIS — Z9049 Acquired absence of other specified parts of digestive tract: Secondary | ICD-10-CM | POA: Diagnosis not present

## 2018-05-04 DIAGNOSIS — Z79899 Other long term (current) drug therapy: Secondary | ICD-10-CM | POA: Diagnosis not present

## 2018-05-04 DIAGNOSIS — Z7982 Long term (current) use of aspirin: Secondary | ICD-10-CM | POA: Diagnosis not present

## 2018-05-04 DIAGNOSIS — I4510 Unspecified right bundle-branch block: Secondary | ICD-10-CM | POA: Diagnosis not present

## 2018-05-04 DIAGNOSIS — R05 Cough: Secondary | ICD-10-CM | POA: Diagnosis not present

## 2018-05-04 LAB — BASIC METABOLIC PANEL
Anion gap: 11 (ref 5–15)
BUN: 12 mg/dL (ref 8–23)
CHLORIDE: 99 mmol/L (ref 98–111)
CO2: 27 mmol/L (ref 22–32)
Calcium: 9.2 mg/dL (ref 8.9–10.3)
Creatinine, Ser: 0.93 mg/dL (ref 0.44–1.00)
GFR calc Af Amer: >60 mL/min (ref >60)
GFR, EST NON AFRICAN AMERICAN: 58 mL/min — AB (ref >60)
GLUCOSE: 189 mg/dL — AB (ref 70–99)
POTASSIUM: 3.7 mmol/L (ref 3.5–5.1)
Sodium: 137 mmol/L (ref 135–145)

## 2018-05-04 LAB — CBC WITH DIFFERENTIAL/PLATELET
Abs Immature Granulocytes: 0.06 10*3/uL (ref 0.00–0.07)
Basophils Absolute: 0.1 10*3/uL (ref 0.0–0.1)
Basophils Relative: 1 %
Eosinophils Absolute: 1.4 10*3/uL — ABNORMAL HIGH (ref 0.0–0.5)
Eosinophils Relative: 8 %
HEMATOCRIT: 34.5 % — AB (ref 36.0–46.0)
HEMOGLOBIN: 10.4 g/dL — AB (ref 12.0–15.0)
Immature Granulocytes: 0 %
LYMPHS ABS: 2.7 10*3/uL (ref 0.7–4.0)
LYMPHS PCT: 16 %
MCH: 27.1 pg (ref 26.0–34.0)
MCHC: 30.1 g/dL (ref 30.0–36.0)
MCV: 89.8 fL (ref 80.0–100.0)
MONO ABS: 1 10*3/uL (ref 0.1–1.0)
MONOS PCT: 6 %
Neutro Abs: 11.1 10*3/uL — ABNORMAL HIGH (ref 1.7–7.7)
Neutrophils Relative %: 69 %
Platelets: 702 10*3/uL — ABNORMAL HIGH (ref 150–400)
RBC: 3.84 MIL/uL — ABNORMAL LOW (ref 3.87–5.11)
RDW: 16.5 % — ABNORMAL HIGH (ref 11.5–15.5)
WBC: 16.2 10*3/uL — ABNORMAL HIGH (ref 4.0–10.5)
nRBC: 0 % (ref 0.0–0.2)

## 2018-05-04 MED ORDER — SODIUM CHLORIDE 0.9 % IV BOLUS
500.0000 mL | Freq: Once | INTRAVENOUS | Status: AC
  Administered 2018-05-04: 500 mL via INTRAVENOUS

## 2018-05-04 NOTE — Discharge Instructions (Addendum)
The testing today is reassuring.  There is no sign of stroke, or acute unstable neurologic problems.  Continue taking Keppra as prescribed for possible seizures.  Follow-up with your PCP, oncologist, and neurologist this week as scheduled.

## 2018-05-04 NOTE — ED Triage Notes (Signed)
Pt c/o tingling and numbness to left hand and blurred vision to left eye and pt stated it felt like her face was "getting tight"; pt states the symptoms started at 2030 tonight and has since then resolved; cbg at home 372; pt took asa 81mg  pta; pt was seen last month for same complaints and was sent to [facility] and diagnosed with seizures; daughter states pt had an episode of staring off for a few seconds

## 2018-05-04 NOTE — ED Notes (Signed)
Pt with c/o blurry vision to left eye, but has resolved once this nurse assessed.  Numbness to left arm/hand, which resolved as well.

## 2018-05-04 NOTE — ED Provider Notes (Signed)
[facility] EMERGENCY DEPARTMENT Provider Note   CSN: [id number] Arrival date & time: 05/04/18  2114     History   Chief Complaint Chief Complaint  Patient presents with  . Weakness    HPI [name] is a 77 y.o. female.  HPI   Patient presents for evaluation of numbness in her left hand.  She has had 2 episodes of this numbness since 8:30 PM tonight.  She also had transient blurred vision in her left eye prior to onset of the numbness.  Both the blurred vision and numbness have resolved at the time she was seen by me, 9:35 PM.  The symptoms are similar to when she was hospitalized earlier this month.  At that time she was diagnosed with possible seizure disorder despite EEG being negative.  She was started on Keppra which she continues to take and has a follow-up appointment scheduled with neurology, in 3 days.  No recent fever, chills, nausea, vomiting, gait disorder or injuries.  She has had a nonproductive cough for several days.  No anorexia.  There are no other known modifying factors.  Past Medical History:  Diagnosis Date  . Carotid artery occlusion   . Clostridium difficile infection 06/2013  . Diabetes mellitus    takes Janumet daily  . Dyslipidemia    takes Crestor daily  . Gallstones   . GERD (gastroesophageal reflux disease)   . Heart murmur    hx of  . History of radiation therapy 06/11/17-06/21/17   right lung 50 Gy in 5 fractions  . Hypertension    takes Prinzide and Verapamil daily  . Impaired speech    from stroke  . Neuropathy, diabetic ([place])   . Pneumonia   . PONV (postoperative nausea and vomiting)   . Seizures ([place]) 04/19/2018   recent hospitalization  . Smoker   . Stroke ([place]) 06/25/13  . Vertigo    but doesn't take any meds    Patient Active Problem List   Diagnosis Date Noted  . Partial seizure ([place]) 04/21/2018  . Benign essential HTN 04/21/2018  . Type 2 diabetes mellitus with hyperlipidemia ([name]) 04/21/2018  . Slurred speech  04/19/2018  . Hypokalemia 04/10/2018  . TIA (transient ischemic attack) 04/09/2018  . Malnutrition of moderate degree 03/01/2018  . AKI (acute kidney injury) ([name]) 02/27/2018  . Malignant neoplasm of lung ([name]) 07/04/2017  . Non-small cell lung cancer ([name]) 05/28/2017  . Pneumothorax after biopsy 05/23/2017  . Migraine equivalent 04/15/2017  . Smoker 04/15/2017  . Lung mass 02/16/2017  . Transient cerebral ischemia 02/15/2017  . Hyperlipidemia associated with type 2 diabetes mellitus ([name]) 03/07/2016  . Adenomatous colon polyp s/p laparoscopic right colectomy 09/02/15 09/02/2015  . Hemispheric carotid artery syndrome 04/07/2015  . Cataract associated with type 2 diabetes mellitus ([name]) 03/08/2015  . S/P recent Left carotid endarterectomy 07/14/2013  . History of CVA (cerebrovascular accident) 06/27/2013  . Leukocytosis 11/17/2012  . Hypertension associated with diabetes ([place]) 01/04/2011  . DM type 2 with diabetic dyslipidemia ([place]) 01/04/2011  . Light smoker 01/04/2011    Past Surgical History:  Procedure Laterality Date  . cataract removed Right   . CHOLECYSTECTOMY    . ENDARTERECTOMY Left 07/14/2013   Procedure: ENDARTERECTOMY CAROTID-LEFT;  Surgeon: Elam Dutch, MD;  Location: [place];  Service: Vascular;  Laterality: Left;  . IR PERC PLEURAL DRAIN W/INDWELL CATH W/IMG GUIDE  05/23/2017  . KNEE SURGERY Left 59yr ago  . LAPAROSCOPIC PARTIAL COLECTOMY N/A 09/02/2015   Procedure:  LAPAROSCOPIC PARTIAL RIGHT COLECTOMY;  Surgeon: Leighton Ruff, MD;  Location: [facility];  Service: General;  Laterality: N/A;  . PATCH ANGIOPLASTY Left 07/14/2013   Procedure: LEFT CAROTID ARTERY PATCH ANGIOPLASTY;  Surgeon: Elam Dutch, MD;  Location: [place];  Service: Vascular;  Laterality: Left;     OB History   None      Home Medications    Prior to Admission medications   Medication Sig Start Date End Date Taking? Authorizing Provider  acetaminophen (TYLENOL) 325 MG tablet Take 325 mg  by mouth every 6 (six) hours as needed for mild pain.     [provider]  aspirin EC 81 MG tablet Take 81 mg by mouth daily.    [provider]  Blood Glucose Monitoring Suppl (ONE TOUCH ULTRA 2) w/Device KIT 1 Device by Does not apply route 2 (two) times daily. 08/21/17   [name], MD  clopidogrel (PLAVIX) 75 MG tablet TAKE 1 TABLET BY MOUTH  DAILY Patient taking differently: Take 75 mg by mouth daily.  12/12/17   [name], MD  glucose blood (ONE TOUCH ULTRA TEST) test strip Use as instructed 02/10/18   [name], MD  Lancets [facility] ULTRASOFT) lancets Use as instructed 08/21/17   [name], MD  levETIRAcetam (KEPPRA) 500 MG tablet Take 1 tablet (500 mg total) by mouth 2 (two) times daily. 04/21/18   Samuella Cota, MD  magnesium oxide (MAG-OX) 400 (241.3 Mg) MG tablet Take 1 tablet (400 mg total) by mouth 2 (two) times daily. 04/21/18   Samuella Cota, MD  Multiple Vitamin (MULTIVITAMIN WITH MINERALS) TABS tablet Take 1 tablet by mouth daily. 03/03/18   Hosie Poisson, MD  rosuvastatin (CRESTOR) 20 MG tablet TAKE 1 TABLET BY MOUTH ONCE DAILY 04/30/18   [name], MD  sitaGLIPtin-metformin (JANUMET) 50-1000 MG tablet Take 1 tablet by mouth 2 (two) times daily. Patient taking differently: Take 1 tablet by mouth 2 (two) times daily before a meal.  11/13/17   [name], MD  triamcinolone (NASACORT) 55 MCG/ACT AERO nasal inhaler Place 1 spray into the nose daily. 04/22/18   Samuella Cota, MD    Family History Family History  Problem Relation Age of Onset  . Leukemia Mother   . Diabetes Mother   . Cancer Father        esophageal ca  . Heart disease Father   . Diabetes Maternal Aunt   . Diabetes Maternal Uncle   . Diabetes Brother   . Diabetes Sister   . Heart attack Brother        x 2    Social History Social History   Tobacco Use  . Smoking status: Current Every Day Smoker    Packs/day: 0.50    Years: 60.00    Pack years:  30.00    Types: Cigarettes  . Smokeless tobacco: Never Used  . Tobacco comment: Discussed options for counseling and OTC medication.  Substance Use Topics  . Alcohol use: No    Alcohol/week: 0.0 standard drinks  . Drug use: No     Allergies   Codeine; Lipitor [atorvastatin]; and Phenergan [promethazine hcl]   Review of Systems Review of Systems  All other systems reviewed and are negative.    Physical Exam Updated Vital Signs BP (!) 164/90   Pulse 93   Temp 98.7 F (37.1 C) (Oral)   Resp 16   Ht _0  (1.676 m)   Wt 60.3  kg   SpO2 96%   BMI 21.47 kg/m   Physical Exam  Constitutional: She is oriented to person, place, and time. She appears well-developed. No distress.  Elderly, frail  HENT:  Head: Normocephalic and atraumatic.  Eyes: Pupils are equal, round, and reactive to light. Conjunctivae and EOM are normal.  Neck: Normal range of motion and phonation normal. Neck supple.  Cardiovascular: Normal rate and regular rhythm.  Pulmonary/Chest: Effort normal and breath sounds normal. No stridor. No respiratory distress. She has no wheezes. She has no rales. She exhibits no tenderness.  Mild cough during exam.  Abdominal: Soft. She exhibits no distension. There is no tenderness. There is no guarding.  Musculoskeletal: Normal range of motion.  Normal strength arms and legs bilaterally.  Neurological: She is alert and oriented to person, place, and time. She exhibits normal muscle tone.  No dysarthria, or aphasia.  Very mild dysesthesia of the left face, left forearm, and left hand.  No discoordination.  No ataxia.  Skin: Skin is warm and dry.  Psychiatric: She has a normal mood and affect. Her behavior is normal. Judgment and thought content normal.  Nursing note and vitals reviewed.    ED Treatments / Results  Labs (all labs ordered are listed, but only abnormal results are displayed) Labs Reviewed  BASIC METABOLIC PANEL - Abnormal; Notable for the following  components:      Result Value   Glucose, Bld 189 (*)    GFR calc non Af Amer 58 (*)    All other components within normal limits  CBC WITH DIFFERENTIAL/PLATELET - Abnormal; Notable for the following components:   WBC 16.2 (*)    RBC 3.84 (*)    Hemoglobin 10.4 (*)    HCT 34.5 (*)    RDW 16.5 (*)    Platelets 702 (*)    Neutro Abs 11.1 (*)    Eosinophils Absolute 1.4 (*)    All other components within normal limits    EKG EKG Interpretation  Date/Time:  Sunday May 04 2018 21:28:02 EDT Ventricular Rate:  93 PR Interval:    QRS Duration: 133 QT Interval:  385 QTC Calculation: 479 R Axis:   17 Text Interpretation:  Sinus rhythm Right bundle branch block since last tracing no significant change Confirmed by Daleen Bo [phone]) on 05/04/2018 9:34:28 PM   Radiology Dg Chest 2 View  Result Date: 05/04/2018 CLINICAL DATA:  Cough EXAM: CHEST - 2 VIEW COMPARISON:  04/09/2018 FINDINGS: Unchanged appearance of right upper lobe mass. Cardiomediastinal contours are normal. No other consolidation or pulmonary edema. No pleural effusion or pneumothorax. IMPRESSION: Unchanged appearance of right upper lobe mass. No acute airspace disease. Electronically Signed   By: Ulyses Jarred M.D.   On: 05/04/2018 23:39   Ct Head Wo Contrast  Result Date: 05/04/2018 CLINICAL DATA:  Blurred vision. EXAM: CT HEAD WITHOUT CONTRAST TECHNIQUE: Contiguous axial images were obtained from the base of the skull through the vertex without intravenous contrast. COMPARISON:  04/19/2018 FINDINGS: Brain: Old infarct in the left frontal lobe is stable. No acute intracranial abnormality. Specifically, no hemorrhage, hydrocephalus, mass lesion, acute infarction, or significant intracranial injury. Vascular: No hyperdense vessel or unexpected calcification. Skull: No acute calvarial abnormality. Sinuses/Orbits: Visualized paranasal sinuses and mastoids clear. Orbital soft tissues unremarkable. Other: None IMPRESSION:  Old left frontal infarct with encephalomalacia, stable. No acute intracranial abnormality. Electronically Signed   By: Rolm Baptise M.D.   On: 05/04/2018 23:27    Procedures Procedures (including critical care  time)  Medications Ordered in ED Medications  sodium chloride 0.9 % bolus 500 mL (500 mLs Intravenous New Bag/Given 05/04/18 2217)     Initial Impression / Assessment and Plan / ED Course  I have reviewed the triage vital signs and the nursing notes.  Pertinent labs & imaging results that were available during my care of the patient were reviewed by me and considered in my medical decision making (see chart for details).  Clinical Course as of May 04 2357  Sun May 04, 2018  2357 Normal except glucose high, GFR low  Basic metabolic panel(!) [EW]  0962 Normal except white count high, hemoglobin low, platelets high  CBC with Differential(!) [EW]  2358 Persistent right upper lung mass, unchanged.  Images reviewed by me  DG Chest 2 View [EW]  2358 No acute abnormalities, images reviewed by me  CT Head Wo Contrast [EW]    Clinical Course User Index [EW] Daleen Bo, MD     Patient Vitals for the past 24 hrs:  BP Temp Temp src Pulse Resp SpO2 Height Weight  05/04/18 2145 - - - 93 16 96 % - -  05/04/18 2130 (!) 164/90 - - 93 (!) 22 96 % - -  05/04/18 2125 (!) 180/78 98.7 F (37.1 C) Oral 87 20 94 % - -  05/04/18 2124 - - - - - - _0  (1.676 m) 60.3 kg    11:58 PM Reevaluation with update and discussion. After initial assessment and treatment, an updated evaluation reveals she is comfortable now and states her numbness has not recurred.  Findings discussed with patient and daughter, all questions answered.  Patient has follow-up with PCP tomorrow, oncology and neurology on Wednesday.Daleen Bo   Medical Decision Making: Recurrent symptoms, previously diagnosed as likely seizure disorder and started on Keppra.  No evidence for stroke, or metabolic instability.  No  indication for further ED intervention or hospitalization at this time  CRITICAL CARE-no Performed by: Daleen Bo  Nursing Notes Reviewed/ Care Coordinated Applicable Imaging Reviewed Interpretation of Laboratory Data incorporated into ED treatment  The patient appears reasonably screened and/or stabilized for discharge and I doubt any other medical condition or other [facility] requiring further screening, evaluation, or treatment in the ED at this time prior to discharge.  Plan: Home Medications-continue usual medications; Home Treatments-rest, fluids; return here if the recommended treatment, does not improve the symptoms; Recommended follow up-physicians this week as scheduled  Final Clinical Impressions(s) / ED Diagnoses   Final diagnoses:  Paresthesias    ED Discharge Orders    None       Daleen Bo, MD 05/05/18 0001

## 2018-05-04 NOTE — ED Notes (Addendum)
Pt with HA now, left eye "flinkering" per pt, denies numbness  At this time.  States it's like a flashing light to left eye.

## 2018-05-05 ENCOUNTER — Ambulatory Visit (INDEPENDENT_AMBULATORY_CARE_PROVIDER_SITE_OTHER): Payer: Medicare Other | Admitting: Family Medicine

## 2018-05-05 ENCOUNTER — Encounter: Payer: Self-pay | Admitting: Family Medicine

## 2018-05-05 VITALS — BP 142/80 | HR 96 | Temp 98.1°F | Wt 129.4 lb

## 2018-05-05 DIAGNOSIS — G459 Transient cerebral ischemic attack, unspecified: Secondary | ICD-10-CM | POA: Diagnosis not present

## 2018-05-05 DIAGNOSIS — D72829 Elevated white blood cell count, unspecified: Secondary | ICD-10-CM | POA: Diagnosis not present

## 2018-05-05 DIAGNOSIS — C3490 Malignant neoplasm of unspecified part of unspecified bronchus or lung: Secondary | ICD-10-CM | POA: Diagnosis not present

## 2018-05-05 LAB — POCT URINALYSIS DIP (PROADVANTAGE DEVICE)
Bilirubin, UA: NEGATIVE
Glucose, UA: 100 mg/dL — AB
Ketones, POC UA: NEGATIVE mg/dL
LEUKOCYTES UA: NEGATIVE
NITRITE UA: NEGATIVE
PH UA: 7 (ref 5.0–8.0)
RBC UA: NEGATIVE
Specific Gravity, Urine: 1.02
UUROB: 3.5

## 2018-05-05 NOTE — Progress Notes (Signed)
   Subjective:    Patient ID: [name], female    DOB: April 06, 1941, 77 y.o.   MRN: [id number]  HPI She is here for follow-up after recent hospitalization as well as recent emergency room visit.  The hospital discharge summary as well as the most recent ER visit was reviewed.  The previous hospitalization there was question of slurred speech as well as hand numbness.  There was suspicion for seizure.  She has been on Keppra for this.  She also has a history of chronic leukocytosis.  She was seen yesterday in the emergency room for evaluation of left hand numbness.  She did have an MRI on October 6 and is scheduled for a PET scan on Wednesday of this week.  The PET scan is for follow-up on her lung cancer.  Presently she is complaining of just feeling weak.   Review of Systems     Objective:   Physical Exam Alert and oriented X3 however following commands was difficult for her to do.  EOMI.  Other cranial nerves grossly intact. Urine microscopic was negative.      Assessment & Plan:  Leukocytosis, unspecified type - Plan: POCT Urinalysis DIP (Proadvantage Device), CANCELED: Urinalysis Dipstick  TIA (transient ischemic attack)  Malignant neoplasm of lung, unspecified laterality, unspecified part of lung ([place]) Her daughter his with her.  I explained that at this point there is no good reason for her elevated white count.  The neurologic symptoms that she is having to me seem mostly TIA related.  This is especially in removal of a recent MRI which showed no major changes.  She will continue to be followed for her lung cancer.  Over 25 minutes, greater than 50% spent in counseling and coordination of care.

## 2018-05-07 ENCOUNTER — Encounter: Payer: Self-pay | Admitting: Neurology

## 2018-05-07 ENCOUNTER — Telehealth: Payer: Self-pay | Admitting: Neurology

## 2018-05-07 ENCOUNTER — Ambulatory Visit: Payer: Medicare Other | Admitting: Neurology

## 2018-05-07 ENCOUNTER — Other Ambulatory Visit: Payer: Self-pay | Admitting: Family Medicine

## 2018-05-07 ENCOUNTER — Ambulatory Visit: Payer: Self-pay | Admitting: Pharmacist

## 2018-05-07 ENCOUNTER — Ambulatory Visit (HOSPITAL_COMMUNITY)
Admission: RE | Admit: 2018-05-07 | Discharge: 2018-05-07 | Disposition: A | Discharge status: Home / Self Care | Payer: Medicare Other | Source: Ambulatory Visit | Attending: Radiation Oncology | Admitting: Radiation Oncology

## 2018-05-07 VITALS — BP 127/65 | HR 90 | Ht 66.0 in | Wt 128.0 lb

## 2018-05-07 DIAGNOSIS — R918 Other nonspecific abnormal finding of lung field: Secondary | ICD-10-CM | POA: Diagnosis not present

## 2018-05-07 DIAGNOSIS — C3491 Malignant neoplasm of unspecified part of right bronchus or lung: Secondary | ICD-10-CM | POA: Insufficient documentation

## 2018-05-07 DIAGNOSIS — G3184 Mild cognitive impairment, so stated: Secondary | ICD-10-CM

## 2018-05-07 DIAGNOSIS — C3411 Malignant neoplasm of upper lobe, right bronchus or lung: Secondary | ICD-10-CM | POA: Diagnosis not present

## 2018-05-07 LAB — GLUCOSE, CAPILLARY: Glucose-Capillary: 201 mg/dL — ABNORMAL HIGH (ref 70–99)

## 2018-05-07 MED ORDER — DIVALPROEX SODIUM ER 500 MG PO TB24: 500.0000 mg | ORAL_TABLET | Freq: Every day | ORAL | 3 refills | Status: DC

## 2018-05-07 MED ORDER — FLUDEOXYGLUCOSE F - 18 (FDG) INJECTION
6.3300 | Freq: Once | INTRAVENOUS | Status: AC | PRN
  Administered 2018-05-07: 6.33 via INTRAVENOUS

## 2018-05-07 NOTE — Progress Notes (Signed)
[facility] [address] [place]. [place] [address] [phone]       OFFICE CONSULT NOTE  [name] Date of Birth:  Jul 31, 1940 Medical Record Number:  [id number]   Referring MD: Murray Hodgkins  Reason for Referral: Numbness  HPI: [name] is a pleasant 77 year old Caucasian lady with past medical history of left frontal MCA branch infarct in December 2014, hypertension, hyperlipidemia, diabetes, diabetic neuropathy who has been having recurrent episodes of transient face and left hand numbness.  History is obtained from the patient and daughter and review of electronic medical records.  I have personally reviewed imaging films.  The daughter states that since April 2019 she has been having frequent episodes in which she starts with slight blurred vision in the left eye followed by her left cheek and left hand numbness.  Her left hand goes to sleep.  She has some trouble speaking because of this episode last typically 15 to 20 minutes.  There is no headache preceding or during these episodes though some of them have been followed by mild headache.  She is fully awake and aware of her surroundings during these episodes.  There are no specific triggers for these episodes and they occur at a variable frequency but may occur several times a month.  She has been hospitalized a few times for these episodes but brain imaging and EEG studies have been unyielding.  Most recently MRI scan of the brain on 04/20/2018 showed old left frontal I per intensity which was felt to be old left frontal infarct.  This was unchanged from prior scans from 2015 though in retrospect when I reviewed the initial MRI from December 2014 which showed the infarct it was much smaller than the present size of the old infarct.  Patient has not had any postcontrast images.  CT angiograms on 04/09/2018 of the brain and neck both showed no significant extracranial or intracranial stenosis.  Transthoracic echo  on 9/26 at 19 was normal.  LDL cholesterol was 36 mg percent and hemoglobin A1c was elevated at 9.4.  EEG done on 04/21/2018 was normal.  These episodes occur at a frequency of every 3 to 4 weeks the mostly involve the left hand and left face but only rarely right hand has also been involved.  The patient feels at times that her left hand may be set drawn up though she denies any weakness or involuntary jerking or movements.  Patient was recently started on Keppra 500 twice daily 2 weeks ago but states she is still had 2 more episodes last Sunday.  Patient has been slightly confused following the last episode and disoriented and daughter feels she has not yet returned back to baseline.  Patient has a history of non-small cell cancer in the right upper lobe of the lung.  She refused surgery and was treated with radiation only.  She recently had a PET scan of the body done this morning the report of which is pending.  She has not been on chemotherapy for her cancer.  ROS:   14 system review of systems is positive for weight loss, fatigue, blurred vision, cough, easy bruising, confusion, numbness, slurred speech, too much sleep and all other systems negative PMH:  Past Medical History:  Diagnosis Date  . Carotid artery occlusion   . Clostridium difficile infection 06/2013  . Diabetes mellitus    takes Janumet daily  . Dyslipidemia    takes Crestor daily  . Gallstones   . GERD (  gastroesophageal reflux disease)   . Heart murmur    hx of  . History of radiation therapy 06/11/17-06/21/17   right lung 50 Gy in 5 fractions  . Hypertension    takes Prinzide and Verapamil daily  . Impaired speech    from stroke  . Neuropathy, diabetic ([place])   . Pneumonia   . PONV (postoperative nausea and vomiting)   . Seizures ([place]) 04/19/2018   recent hospitalization  . Smoker   . Stroke ([place]) 06/25/13  . Vertigo    but doesn't take any meds    Social History:  Social History   Socioeconomic History  .  Marital status: Married    Spouse name: Jeneen Rinks  . Number of children: 3  . Years of education: 10  . Highest education level: Not on file  Occupational History  . Occupation: retired  Scientific laboratory technician  . Financial resource strain: Not on file  . Food insecurity:    Worry: Not on file    Inability: Not on file  . Transportation needs:    Medical: Not on file    Non-medical: Not on file  Tobacco Use  . Smoking status: Current Every Day Smoker    Packs/day: 0.50    Years: 60.00    Pack years: 30.00    Types: Cigarettes  . Smokeless tobacco: Never Used  Substance and Sexual Activity  . Alcohol use: No    Alcohol/week: 0.0 standard drinks  . Drug use: No  . Sexual activity: Yes  Lifestyle  . Physical activity:    Days per week: Not on file    Minutes per session: Not on file  . Stress: Not on file  Relationships  . Social connections:    Talks on phone: Not on file    Gets together: Not on file    Attends religious service: Not on file    Active member of club or organization: Not on file    Attends meetings of clubs or organizations: Not on file    Relationship status: Not on file  . Intimate partner violence:    Fear of current or ex partner: Not on file    Emotionally abused: Not on file    Physically abused: Not on file    Forced sexual activity: Not on file  Other Topics Concern  . Not on file  Social History Narrative   Patient is married with 3 children.   Patient is right handed.   Patient has 10 th grade education.   Patient drinks 4 cups daily.    Medications:   Current Outpatient Medications on File Prior to Visit  Medication Sig Dispense Refill  . acetaminophen (TYLENOL) 325 MG tablet Take 325 mg by mouth every 6 (six) hours as needed for mild pain.     [place] Kitchen aspirin EC 81 MG tablet Take 81 mg by mouth daily.    . Blood Glucose Monitoring Suppl (ONE TOUCH ULTRA 2) w/Device KIT 1 Device by Does not apply route 2 (two) times daily. 1 each 0  . clopidogrel  (PLAVIX) 75 MG tablet TAKE 1 TABLET BY MOUTH  DAILY (Patient taking differently: Take 75 mg by mouth daily. ) 90 tablet 3  . glucose blood (ONE TOUCH ULTRA TEST) test strip Use as instructed 100 each 12  . Lancets (ONETOUCH ULTRASOFT) lancets Use as instructed 100 each 12  . magnesium oxide (MAG-OX) 400 (241.3 Mg) MG tablet Take 1 tablet (400 mg total) by mouth 2 (two) times daily. [place]  tablet 0  . OVER THE COUNTER MEDICATION 5 mLs.    . rosuvastatin (CRESTOR) 20 MG tablet TAKE 1 TABLET BY MOUTH ONCE DAILY 180 tablet 0  . sitaGLIPtin-metformin (JANUMET) 50-1000 MG tablet Take 1 tablet by mouth 2 (two) times daily. (Patient taking differently: Take 1 tablet by mouth 2 (two) times daily before a meal. ) 120 tablet 0   No current facility-administered medications on file prior to visit.     Allergies:   Allergies  Allergen Reactions  . Codeine Nausea And Vomiting  . Lipitor [Atorvastatin] Nausea And Vomiting  . Phenergan [Promethazine Hcl] Other (See Comments)    Confusion, hallucinations, severe agitation    Physical Exam General: Frail cachectic looking elderly Caucasian lady, seated, in no evident distress Head: head normocephalic and atraumatic.   Neck: supple with no carotid or supraclavicular bruits Cardiovascular: regular rate and rhythm, no murmurs Musculoskeletal: no deformity Skin:  no rash/petichiae Vascular:  Normal pulses all extremities  Neurologic Exam Mental Status: Awake and fully alert. Oriented to place and time. Recent and remote memory poor. Attention span, concentration and fund of knowledge diminished. Mood and affect appropriate.  Recall 0/3.  Able to name only 4 animals with  4 legs. Cranial Nerves: Fundoscopic exam reveals sharp disc margins. Pupils equal, briskly reactive to light. Extraocular movements full without nystagmus. Visual fields full to confrontation. Hearing intact. Facial sensation intact. Face, tongue, palate moves normally and symmetrically.    Motor: Normal bulk and tone. Normal strength in all tested extremity muscles. Sensory.: intact to touch , pinprick , position and vibratory sensation.  Coordination: Rapid alternating movements normal in all extremities. Finger-to-nose and heel-to-shin performed accurately bilaterally. Gait and Station: Arises from chair without difficulty. Stance is broad-based l. Gait demonstrates mild imbalance.  Unable to stand on either foot unsupported or on a narrow base..  Not able to heel, toe and tandem walk  .  Reflexes: 1+ and symmetric. Toes downgoing.      ASSESSMENT: 77 year old lady with recurrent transient episodes of mostly left face and hand paresthesias of unclear etiology.  Possibilities include partial seizures versus atypical migraines.  Recent cognitive impairment and memory difficulties and gait imbalance also need evaluation.  Remote history of left frontal MCA branch infarct in December 2014.  She also has history of lung cancer hence  paraneoplastic or meningeal carcinomatosis is also a consideration .    PLAN: I had a long discussion with the patient and her daughter regarding her recurrent stereotypical episodes of transient blurred vision and left face and hand paresthesias being of unclear etiology.  Most likely probability is partial seizures versus a typical migraine episodes.  TIAs would be unlikely given negative neurovascular evaluation.  I recommend changing Keppra to Depakote ER 500 mg daily due to possible side effects and lack of efficacy.  Patient also has some cognitive worsening in recent months would suggest evaluation for that by checking EEG, memory panel labs and paraneoplastic antibodies.  Check MRI scan of the brain with contrast as all previous studies have been noncontrast studies and left frontal lesion on MRI looks larger than original stroke in 2014.  May need to consider doing diagnostic spinal tap but patient and daughter seemed quite reluctant to do this given  adverse reaction she had during last spinal tap in 2015.  Greater than 50% time during this 50-minute consultation visit was spent on counseling and coordination of care about her numbness episodes, cognitive impairment and answering questions she will return for  follow-up in 2 months or call earlier if necessary Antony Contras, MD  [facility] [address] [place], [place] [address]  Phone [phone] Fax [phone] Note: This document was prepared with digital dictation and possible smart phrase technology. Any transcriptional errors that result from this process are unintentional.

## 2018-05-07 NOTE — Telephone Encounter (Signed)
[employer] medicare order sent to [facility] no auth patient daughter is aware of this. If she has not heard from them in the next 2-3 business days to call them at [phone].

## 2018-05-07 NOTE — Patient Instructions (Addendum)
I had a long discussion with the patient and her daughter regarding her recurrent stereotypical episodes of transient blurred vision and left face and hand paresthesias being of unclear etiology.  Most likely probability is partial seizures versus a typical migraine episodes.  TIAs would be unlikely given negative neurovascular evaluation.  I recommend changing Keppra to Depakote ER 500 mg daily due to possible side effects and lack of efficacy.  Patient also has some cognitive worsening in recent months would suggest evaluation for that by checking EEG, memory panel labs and paraneoplastic antibodies.  Check MRI scan of the brain with contrast as all previous studies have been noncontrast studies.  May need to consider doing diagnostic spinal tap but patient and daughter seemed quite reluctant to do this given adverse reaction she had during last spinal tap in 2015. She will return for follow-up in 2 months or call earlier if necessary

## 2018-05-08 ENCOUNTER — Ambulatory Visit: Payer: Self-pay | Admitting: Pharmacist

## 2018-05-08 ENCOUNTER — Other Ambulatory Visit: Payer: Self-pay

## 2018-05-08 ENCOUNTER — Telehealth: Payer: Self-pay | Admitting: *Deleted

## 2018-05-08 DIAGNOSIS — C3491 Malignant neoplasm of unspecified part of right bronchus or lung: Secondary | ICD-10-CM

## 2018-05-08 NOTE — Patient Outreach (Signed)
[facility]) Care Management  05/08/2018  [name] 1941-03-13 [id number]   Successful outreach with [name]. She denied complaints today but reported being evaluated in the ED due to weakness. Stated that she has been feeling well since recent ED discharge and denied falls. Reported compliance with medications but has not check her blood glucose regularly. Unable to recall last reading. She attended her scheduled PCP follow up on 05/05/18 and reported that she will follow up with her oncology provider on next week. [name] voiced no urgent concerns and agreed to contact [facility] for assistance if needed.  [facility] CM Care Plan Problem One     Most Recent Value  Care Plan Problem One  Risk for Rehospitalization  Role Documenting the Problem One  Care Management Coordinator  Care Plan for Problem One  Active  [facility]N Long Term Goal   Over the next 60 days, patient will not be readmitted for complications related to chronic disease management.  [facility] Long Term Goal Start Date  05/02/18  Interventions for Problem One Long Term Goal  Discussed medication, nutritions and fall risk. Discussed importance of monitoring blood glucose daily. [Patient evaluated in ED since home visit.]  [facility] CM Short Term Goal #1   Over the next 30 days, patient will take all medications as prescribed.  [facility] CM Short Term Goal #1 Start Date  05/02/18  Interventions for Short Term Goal #1  Discussed importance of taking medications as prescribed.  [facility] CM Short Term Goal #2   Over the next 30 days patient will attend all scheduled MD appointments.  [facility] CM Short Term Goal #2 Start Date  05/02/18  Interventions for Short Term Goal #2  Reviewed pendind and scheduled appointments. Patient reported attending scheduled follow up with PCP on 05/05/18.  [facility] CM Short Term Goal #3  Over the next 30 days patient will verbalize a plan related to smoking cessation.  THN CM Short Term Goal #3 Start Date  05/02/18   Interventions for Short Tern Goal #3  Reviewed options for assistance.     [facility] CM Care Plan Problem Two     Most Recent Value  Care Plan Problem Two  High Risk for Falls  Role Documenting the Problem Two  Care Management Coordinator  Care Plan for Problem Two  Active  Interventions for Problem Two Long Term Goal   Discussed safety and fall precautions.   THN Long Term Goal  Over the next 45 days patient will not have fall related injuries.  THN Long Term Goal Start Date  05/08/18  [facility]N CM Short Term Goal #1   Over the next 30 days patient will practice safety measure and use assistive device when needed.  [facility]N CM Short Term Goal #1 Start Date  05/08/18  Interventions for Short Term Goal #2   Discussed importance of using assistive device and not ambulating when when experiencing weakness.  [facility]N CM Short Term Goal #2   Over the next 30 days patient will avoid activities that may cause overexertion.  [facility]N CM Short Term Goal #2 Start Date  05/08/18  Interventions for Short Term Goal #2  Discussed importance of requesting assistance when needed. Patient reported that her daughter is available in the home to assist.     PLAN Will follow up on next week.   [facility] [phone]

## 2018-05-08 NOTE — Telephone Encounter (Signed)
Oncology Nurse Navigator Documentation  Oncology Nurse Navigator Flowsheets 05/08/2018  Navigator Location [facility]  Referral date to RadOnc/MedOnc 05/08/2018  Navigator Encounter Type Telephone/Dr. Sondra Come spoke with me and Dr. Julien Nordmann today about seeing [name].  I called and updated on appt next week.   Telephone Outgoing Call  Treatment Phase Pre-Tx/Tx Discussion  Barriers/Navigation Needs Education;Coordination of Care  Education Other  Interventions Coordination of Care;Education  Coordination of Care Appts  Education Method Verbal  Acuity Level 2  Time Spent with Patient 30

## 2018-05-14 ENCOUNTER — Other Ambulatory Visit: Payer: Self-pay | Admitting: Pharmacist

## 2018-05-14 ENCOUNTER — Other Ambulatory Visit: Payer: Self-pay

## 2018-05-14 LAB — COMPREHENSIVE METABOLIC PANEL
ALK PHOS: 89 IU/L (ref 39–117)
ALT: 5 IU/L (ref 0–32)
AST: 6 IU/L (ref 0–40)
Albumin/Globulin Ratio: 1.4 (ref 1.2–2.2)
Albumin: 3.9 g/dL (ref 3.5–4.8)
BUN/Creatinine Ratio: 16 (ref 12–28)
BUN: 15 mg/dL (ref 8–27)
Bilirubin Total: 0.4 mg/dL (ref 0.0–1.2)
CO2: 28 mmol/L (ref 20–29)
CREATININE: 0.95 mg/dL (ref 0.57–1.00)
Calcium: 10.5 mg/dL — ABNORMAL HIGH (ref 8.7–10.3)
Chloride: 96 mmol/L (ref 96–106)
GFR calc Af Amer: 67 mL/min/{1.73_m2} (ref >59)
GFR calc non Af Amer: 58 mL/min/{1.73_m2} — ABNORMAL LOW (ref >59)
GLOBULIN, TOTAL: 2.7 g/dL (ref 1.5–4.5)
Glucose: 260 mg/dL — ABNORMAL HIGH (ref 65–99)
Potassium: 4.5 mmol/L (ref 3.5–5.2)
SODIUM: 139 mmol/L (ref 134–144)
Total Protein: 6.6 g/dL (ref 6.0–8.5)

## 2018-05-14 LAB — URINALYSIS, ROUTINE W REFLEX MICROSCOPIC
Bilirubin, UA: NEGATIVE
KETONES UA: NEGATIVE
Leukocytes, UA: NEGATIVE
NITRITE UA: NEGATIVE
RBC UA: NEGATIVE
Specific Gravity, UA: >=1.03 — AB (ref 1.005–1.030)
UUROB: 0.2 mg/dL (ref 0.2–1.0)
pH, UA: 5 (ref 5.0–7.5)

## 2018-05-14 LAB — CBC WITH DIFFERENTIAL/PLATELET
Basophils Absolute: 0.1 10*3/uL (ref 0.0–0.2)
Basos: 0 %
EOS (ABSOLUTE): 0.8 10*3/uL — AB (ref 0.0–0.4)
EOS: 6 %
HEMATOCRIT: 34.6 % (ref 34.0–46.6)
Hemoglobin: 10.7 g/dL — ABNORMAL LOW (ref 11.1–15.9)
Immature Grans (Abs): 0.1 10*3/uL (ref 0.0–0.1)
Immature Granulocytes: 1 %
LYMPHS ABS: 1.9 10*3/uL (ref 0.7–3.1)
Lymphs: 14 %
MCH: 27.6 pg (ref 26.6–33.0)
MCHC: 30.9 g/dL — AB (ref 31.5–35.7)
MCV: 89 fL (ref 79–97)
MONOS ABS: 0.8 10*3/uL (ref 0.1–0.9)
Monocytes: 6 %
Neutrophils Absolute: 9.7 10*3/uL — ABNORMAL HIGH (ref 1.4–7.0)
Neutrophils: 73 %
PLATELETS: 660 10*3/uL — AB (ref 150–450)
RBC: 3.87 x10E6/uL (ref 3.77–5.28)
RDW: 15.8 % — AB (ref 12.3–15.4)
WBC: 13.3 10*3/uL — AB (ref 3.4–10.8)

## 2018-05-14 LAB — DEMENTIA PANEL
HOMOCYSTEINE: 56.6 umol/L — AB (ref 0.0–15.0)
RPR Ser Ql: NONREACTIVE
TSH: 1.19 u[IU]/mL (ref 0.450–4.500)
Vitamin B-12: <150 pg/mL — ABNORMAL LOW (ref 232–1245)

## 2018-05-14 LAB — PARANEOPLASTIC PROFILE 1: Neuronal Nuclear (Hu) Antibody (IB): <1:10 {titer}

## 2018-05-14 NOTE — Patient Outreach (Signed)
[facility]) Care Management  [place]  05/14/2018  TALINE NASS [birth year] [id number]   Reason for referral: medication assistance  Successful outreach to patient's daughter [name]) with HIPAA identifiers verified.  [name] reported that the family has had many events happen lately and she has not had time to get back to me.  Encouraged daughter to call when they are ready to discuss medications and potential assistance.  Plan:  -I will check in with [facility] RNCM to see if any action is needed in the mean time -Encouaged patient's daughter to reach out once they are able  Regina Eck, PharmD, [place]  [phone]

## 2018-05-14 NOTE — Patient Outreach (Signed)
[name] [facility]) Care Management  05/14/2018  [name] 04-Apr-1941 [id number]    Unsuccessful outreach. Left HIPAA compliant voice message requesting a return call.   PLAN Will follow up within 3-4 business days.   [place] [phone]

## 2018-05-15 ENCOUNTER — Inpatient Hospital Stay: Payer: Medicare Other | Attending: Internal Medicine | Admitting: Internal Medicine

## 2018-05-15 ENCOUNTER — Telehealth: Payer: Self-pay | Admitting: Family Medicine

## 2018-05-15 ENCOUNTER — Encounter: Payer: Self-pay | Admitting: Internal Medicine

## 2018-05-15 ENCOUNTER — Other Ambulatory Visit: Payer: Medicare Other

## 2018-05-15 ENCOUNTER — Encounter: Payer: Self-pay | Admitting: *Deleted

## 2018-05-15 VITALS — BP 163/72 | HR 103 | Temp 98.3°F | Resp 16 | Ht 66.5 in | Wt 131.1 lb

## 2018-05-15 DIAGNOSIS — Z7189 Other specified counseling: Secondary | ICD-10-CM | POA: Diagnosis not present

## 2018-05-15 DIAGNOSIS — C3491 Malignant neoplasm of unspecified part of right bronchus or lung: Secondary | ICD-10-CM

## 2018-05-15 DIAGNOSIS — R918 Other nonspecific abnormal finding of lung field: Secondary | ICD-10-CM

## 2018-05-15 DIAGNOSIS — F17210 Nicotine dependence, cigarettes, uncomplicated: Secondary | ICD-10-CM

## 2018-05-15 DIAGNOSIS — Z79899 Other long term (current) drug therapy: Secondary | ICD-10-CM

## 2018-05-15 DIAGNOSIS — E119 Type 2 diabetes mellitus without complications: Secondary | ICD-10-CM | POA: Diagnosis not present

## 2018-05-15 DIAGNOSIS — C3411 Malignant neoplasm of upper lobe, right bronchus or lung: Secondary | ICD-10-CM

## 2018-05-15 DIAGNOSIS — I10 Essential (primary) hypertension: Secondary | ICD-10-CM | POA: Diagnosis not present

## 2018-05-15 DIAGNOSIS — Z7982 Long term (current) use of aspirin: Secondary | ICD-10-CM

## 2018-05-15 NOTE — Progress Notes (Signed)
[place] [phone]   [phone]  CONSULT NOTE  REFERRING PHYSICIAN: Dr. Gery Pray  REASON FOR CONSULTATION:  77 years old white female with recurrent non-small cell lung cancer.  HPI [name] is a 77 y.o. female with past medical history significant for hypertension, diabetes mellitus, dyslipidemia, GERD, carotid artery occlusion, history of gallstones, lung cancer diagnosed in August 2018 as well as long history of smoking.  The patient mentioned that in July 2018 she was admitted to the hospital for evaluation of TIA and during her evaluation she had CT scan of the neck and head on February 15, 2017 and the upper chest showed right upper lobe spiculated cavitary mass measuring 3.6 by 2.8 cm with a scattered 2-3 mm nodules in the left lung apex.  CT scan of the chest, abdomen and pelvis on 02/16/2017 showed a multilobulated mild spiculated mass at the right upper lobe measuring 3.3 x 2.7 x 3.2 cm.  With some degree of associated cavitation concerning for primary bronchogenic malignancy.  There was no definite satellite nodules identified.  There was no definite evidence of metastatic disease.  There was also questionable enhancing 1.6 cm nodule near the upper pole of the left kidney.  This was suspicious for underlying scarring and normal renal parenchyma.  The patient was seen by Dr. Vaughan Browner and a PET scan was performed on April 26, 2017 and that showed an intensely hypermetabolic cavitary mass measuring 4.1 cm with SUV max of 10.9.  There was no evidence for hypermetabolic lymph nodes within the chest or distant metastatic disease.  The patient was referred to Dr. Roxan Hockey but she was not a good candidate for surgical resection.  On 05/23/2017 she had CT-guided core biopsy of the right upper lobe lung mass by interventional radiology and the final pathology was consistent with non-small cell lung cancer, squamous cell carcinoma.  The patient was  referred to Dr. Sondra Come and she underwent curative stereotactic radiotherapy completed on June 21, 2017.  She was followed by observation and repeat imaging studies.  CT scan of the chest on 10/28/2017 showed no significant change. Repeat CT scan of the chest on February 27, 2018 showed enlargement of the right upper lobe lung mass with extension to the pleura.  The patient was monitored by observation and she had repeat PET scan on May 07, 2018 which showed significant increase in the size of the right upper lobe lung mass and currently measuring 6.3 x 5.4 cm with SUV max of 12.64 consistent with progressive tumor.  He was also small right paratracheal lymph and precarinal lymph nodes all measuring less than 0.8 cm with no hypermetabolism. The PET scan showed no evidence for enlargement or hypermetabolic mediastinal or hilar lymph nodes.  No evidence for pulmonary metastatic nodules and no findings to suggest metastatic disease involving the abdomen/pelvis or bony structures. The patient was referred to me today for evaluation and recommendation regarding treatment of her condition. When seen today she is feeling fine except for feeling tired and fatigued.  She lost her husband to esophageal cancer yesterday.  The patient complains of cough productive of whitish sputum but no significant chest pain, shortness of breath or hemoptysis.  She lost around 20 pounds in the last 6 months.  She continues to have intermittent visual changes.  She had several MRIs of the brain in the last 12 months that were unremarkable for any abnormalities. Family history significant for mother with leukemia and father with  esophageal cancer. The patient is a widow and has 3 daughters.  She was accompanied by her 3 daughter [name] and [name].  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  She has a history of smoking 1 pack/day for over 60 years and unfortunately she continues to smoke.  She has no history of  alcohol or drug abuse.  HPI  Past Medical History:  Diagnosis Date  . Carotid artery occlusion   . Clostridium difficile infection 06/2013  . Diabetes mellitus    takes Janumet daily  . Dyslipidemia    takes Crestor daily  . Gallstones   . GERD (gastroesophageal reflux disease)   . Heart murmur    hx of  . History of radiation therapy 06/11/17-06/21/17   right lung 50 Gy in 5 fractions  . Hypertension    takes Prinzide and Verapamil daily  . Impaired speech    from stroke  . Neuropathy, diabetic ([place])   . Pneumonia   . PONV (postoperative nausea and vomiting)   . Seizures ([place]) 04/19/2018   recent hospitalization  . Smoker   . Stroke ([place]) 06/25/13  . Vertigo    but doesn't take any meds    Past Surgical History:  Procedure Laterality Date  . cataract removed Right   . CHOLECYSTECTOMY    . ENDARTERECTOMY Left 07/14/2013   Procedure: ENDARTERECTOMY CAROTID-LEFT;  Surgeon: Elam Dutch, MD;  Location: [facility];  Service: Vascular;  Laterality: Left;  . IR PERC PLEURAL DRAIN W/INDWELL CATH W/IMG GUIDE  05/23/2017  . KNEE SURGERY Left 36yr ago  . LAPAROSCOPIC PARTIAL COLECTOMY N/A 09/02/2015   Procedure: LAPAROSCOPIC PARTIAL RIGHT COLECTOMY;  Surgeon: ALeighton Ruff MD;  Location: [facility];  Service: General;  Laterality: N/A;  . PATCH ANGIOPLASTY Left 07/14/2013   Procedure: LEFT CAROTID ARTERY PATCH ANGIOPLASTY;  Surgeon: CElam Dutch MD;  [facility];  Service: Vascular;  Laterality: Left;    Family History  Problem Relation Age of Onset  . Leukemia Mother   . Diabetes Mother   . Cancer Father        esophageal ca  . Heart disease Father   . Diabetes Maternal Aunt   . Diabetes Maternal Uncle   . Diabetes Brother   . Diabetes Sister   . Heart attack Brother        x 2    Social History Social History   Tobacco Use  . Smoking status: Current Every Day Smoker    Packs/day: 0.50    Years: 60.00    Pack years: 30.00    Types: Cigarettes  .  Smokeless tobacco: Never Used  Substance Use Topics  . Alcohol use: No    Alcohol/week: 0.0 standard drinks  . Drug use: No    Allergies  Allergen Reactions  . Codeine Nausea And Vomiting  . Lipitor [Atorvastatin] Nausea And Vomiting  . Phenergan [Promethazine Hcl] Other (See Comments)    Confusion, hallucinations, severe agitation    Current Outpatient Medications  Medication Sig Dispense Refill  . acetaminophen (TYLENOL) 325 MG tablet Take 325 mg by mouth every 6 (six) hours as needed for mild pain.     .Marland Kitchenaspirin EC 81 MG tablet Take 81 mg by mouth daily.    . Blood Glucose Monitoring Suppl (ONE TOUCH ULTRA 2) w/Device KIT 1 Device by Does not apply route 2 (two) times daily. 1 each 0  . clopidogrel (PLAVIX) 75 MG tablet TAKE 1 TABLET  BY MOUTH  DAILY (Patient taking differently: Take 75 mg by mouth daily. ) 90 tablet 3  . divalproex (DEPAKOTE ER) 500 MG 24 hr tablet Take 1 tablet (500 mg total) by mouth daily. 30 tablet 3  . glucose blood (ONE TOUCH ULTRA TEST) test strip Use as instructed 100 each 12  . JANUMET 50-1000 MG tablet TAKE 1 TABLET BY MOUTH TWICE DAILY 180 tablet 0  . Lancets (ONETOUCH ULTRASOFT) lancets Use as instructed 100 each 12  . magnesium oxide (MAG-OX) 400 (241.3 Mg) MG tablet Take 1 tablet (400 mg total) by mouth 2 (two) times daily. 60 tablet 0  . OVER THE COUNTER MEDICATION 5 mLs.    . rosuvastatin (CRESTOR) 20 MG tablet TAKE 1 TABLET BY MOUTH ONCE DAILY 180 tablet 0  . sitaGLIPtin-metformin (JANUMET) 50-1000 MG tablet Take 1 tablet by mouth 2 (two) times daily. (Patient taking differently: Take 1 tablet by mouth 2 (two) times daily before a meal. ) 120 tablet 0   No current facility-administered medications for this visit.     Review of Systems  Constitutional: positive for anorexia, fatigue and weight loss Eyes: negative Ears, nose, mouth, throat, and face: negative Respiratory: positive for cough and sputum Cardiovascular:  negative Gastrointestinal: negative Genitourinary:negative Integument/breast: negative Hematologic/lymphatic: negative Musculoskeletal:negative Neurological: negative Behavioral/Psych: negative Endocrine: negative Allergic/Immunologic: negative  Physical Exam  YYT:KPTWS, healthy, no distress, well nourished, well developed and anxious SKIN: skin color, texture, turgor are normal, no rashes or significant lesions HEAD: Normocephalic, No masses, lesions, tenderness or abnormalities EYES: normal, PERRLA, Conjunctiva are pink and non-injected EARS: External ears normal, Canals clear OROPHARYNX:no exudate, no erythema and lips, buccal mucosa, and tongue normal  NECK: supple, no adenopathy, no JVD LYMPH:  no palpable lymphadenopathy, no hepatosplenomegaly BREAST:not examined LUNGS: clear to auscultation , and palpation HEART: regular rate & rhythm and no murmurs ABDOMEN:abdomen soft, non-tender, normal bowel sounds and no masses or organomegaly BACK: Back symmetric, no curvature., No CVA tenderness EXTREMITIES:no joint deformities, effusion, or inflammation, no edema  NEURO: alert & oriented x 3 with fluent speech, no focal motor/sensory deficits  PERFORMANCE STATUS: ECOG 1  LABORATORY DATA: Lab Results  Component Value Date   WBC 13.3 (H) 05/07/2018   HGB 10.7 (L) 05/07/2018   HCT 34.6 05/07/2018   MCV 89 05/07/2018   PLT 660 (H) 05/07/2018      Chemistry      Component Value Date/Time   NA 139 05/07/2018 1147   K 4.5 05/07/2018 1147   CL 96 05/07/2018 1147   CO2 28 05/07/2018 1147   BUN 15 05/07/2018 1147   CREATININE 0.95 05/07/2018 1147   CREATININE 0.87 03/08/2015 0001      Component Value Date/Time   CALCIUM 10.5 (H) 05/07/2018 1147   ALKPHOS 89 05/07/2018 1147   AST 6 05/07/2018 1147   ALT 5 05/07/2018 1147   BILITOT 0.4 05/07/2018 1147       RADIOGRAPHIC STUDIES: Dg Chest 2 View  Result Date: 05/04/2018 CLINICAL DATA:  Cough EXAM: CHEST - 2 VIEW  COMPARISON:  04/09/2018 FINDINGS: Unchanged appearance of right upper lobe mass. Cardiomediastinal contours are normal. No other consolidation or pulmonary edema. No pleural effusion or pneumothorax. IMPRESSION: Unchanged appearance of right upper lobe mass. No acute airspace disease. Electronically Signed   By: Ulyses Jarred M.D.   On: 05/04/2018 23:39   Ct Head Wo Contrast  Result Date: 05/04/2018 CLINICAL DATA:  Blurred vision. EXAM: CT HEAD WITHOUT CONTRAST TECHNIQUE: Contiguous axial images were  obtained from the base of the skull through the vertex without intravenous contrast. COMPARISON:  04/19/2018 FINDINGS: Brain: Old infarct in the left frontal lobe is stable. No acute intracranial abnormality. Specifically, no hemorrhage, hydrocephalus, mass lesion, acute infarction, or significant intracranial injury. Vascular: No hyperdense vessel or unexpected calcification. Skull: No acute calvarial abnormality. Sinuses/Orbits: Visualized paranasal sinuses and mastoids clear. Orbital soft tissues unremarkable. Other: None IMPRESSION: Old left frontal infarct with encephalomalacia, stable. No acute intracranial abnormality. Electronically Signed   By: Rolm Baptise M.D.   On: 05/04/2018 23:27   Ct Head Wo Contrast  Result Date: 04/19/2018 CLINICAL DATA:  TIA. Right-sided weakness today. No reported injury. EXAM: CT HEAD WITHOUT CONTRAST TECHNIQUE: Contiguous axial images were obtained from the base of the skull through the vertex without intravenous contrast. COMPARISON:  04/09/2018 head CT. FINDINGS: Brain: Stable left frontal encephalomalacia. No evidence of parenchymal hemorrhage or extra-axial fluid collection. No mass lesion, mass effect, or midline shift. No CT evidence of acute infarction. Generalized cerebral volume loss. Nonspecific mild subcortical and periventricular white matter hypodensity, most in keeping with chronic small vessel ischemic change. Cerebral ventricle sizes are stable and  concordant with the degree of cerebral volume loss. Vascular: No acute abnormality. Skull: No evidence of calvarial fracture. Sinuses/Orbits: The visualized paranasal sinuses are essentially clear. Other:  The mastoid air cells are unopacified. IMPRESSION: 1.  No evidence of acute intracranial abnormality. 2. Stable left frontal encephalomalacia. 3. Mild chronic small vessel ischemic changes in the cerebral white matter. Electronically Signed   By: Ilona Sorrel M.D.   On: 04/19/2018 16:36   Mr Brain Wo Contrast  Result Date: 04/20/2018 CLINICAL DATA:  [place] TIA with bilateral hand numbness, right facial droop, and slurred speech. EXAM: MRI HEAD WITHOUT CONTRAST TECHNIQUE: Multiplanar, multiecho pulse sequences of the brain and surrounding structures were obtained without intravenous contrast. COMPARISON:  Head CT from yesterday.  Brain MRI 04/09/2018 FINDINGS: Brain: No acute infarction, hemorrhage, hydrocephalus, extra-axial collection or mass lesion. Small to moderate left frontal infarct that is remote. Mild periventricular chronic small vessel ischemia. Vascular: Grossly preserved major flow voids Skull and upper cervical spine: No evident marrow lesion Sinuses/Orbits: Right cataract resection. Other: Significantly and progressively motion degraded study which could obscure pathology. IMPRESSION: 1. Significant motion degradation but diagnostic diffusion imaging that is negative for acute infarct. 2. Remote left frontal infarct. Electronically Signed   By: Monte Fantasia M.D.   On: 04/20/2018 22:31   Nm Pet Image Restag (ps) Skull Base To Thigh  Result Date: 05/07/2018 CLINICAL DATA:  Subsequent treatment strategy for non-small cell lung cancer. EXAM: NUCLEAR MEDICINE PET SKULL BASE TO THIGH TECHNIQUE: 6.33 mCi F-18 FDG was injected intravenously. Full-ring PET imaging was performed from the skull base to thigh after the radiotracer. CT data was obtained and used for attenuation correction and  anatomic localization. Fasting blood glucose: 201 mg/dl COMPARISON:  PET-CT 04/26/2017 and CT scan 10/28/2017 and 02/27/2018 FINDINGS: Mediastinal blood pool activity: SUV max 2.44 NECK: No hypermetabolic lymph nodes in the neck. Incidental CT findings: none CHEST: 6.3 x 5.4 cm right upper lobe lung mass extending to the right hilum and the peripheral pleural surface. Areas of necrosis are suggested. The SUV max is 12.64. Findings consistent with progressive tumor. There are surrounding radiation changes Small right paratracheal and precarinal lymph nodes all measure less than 8 mm. No hypermetabolism. No pulmonary nodules to suggest pulmonary metastatic disease. There is a small right pleural effusion. Incidental CT findings: Stable calcified coronary  arteries. ABDOMEN/PELVIS: No abnormal hypermetabolic activity within the liver, pancreas, adrenal glands, or spleen. No hypermetabolic lymph nodes in the abdomen or pelvis. Incidental CT findings: Extensive atherosclerotic calcifications involving the aorta and branch vessels. Evidence of prior right hemicolectomy. SKELETON: No focal hypermetabolic activity to suggest skeletal metastasis. Incidental CT findings: none IMPRESSION: 1. Enlarging right upper lobe lung mass since the prior PET-CT. It previously measured 4.1 x 3.1 cm and now measures 6.3 x 5.4 cm. Marked hypermetabolism consistent with progressive tumor. 2. No enlarged or hypermetabolic mediastinal or hilar lymph nodes. 3. No findings for pulmonary metastatic nodules. 4. No findings to suggest metastatic disease involving the abdomen/pelvis or bony structures. Electronically Signed   By: Marijo Sanes M.D.   On: 05/07/2018 13:42    ASSESSMENT: This is a very pleasant 77 years old white female with recurrent non-small cell lung cancer likely squamous cell carcinoma that was initially diagnosed as a stage IIA (T2b, N0, M0) in August 2018 status post curative stereotactic radiotherapy completed June 21, 2017.  The patient presented today with concerning findings for disease recurrence and progression with enlarging and hypermetabolic right upper lobe lung mass presenting as stage IIIa (T3, N0, M0)    PLAN: I had a lengthy discussion with the patient and her family today about her current disease stage, prognosis and treatment options. I personally and independently reviewed the scan images and discussed the result and showed the images to the patient and her family. I recommended for the patient to have repeat CT-guided core biopsy of the enlarging right upper lobe lung mass for confirmation of the disease recurrence and also to have tissue available for PDL 1 expression. Under the normal circumstances the patient would have benefit from treatment with a course of concurrent chemoradiation with weekly carboplatin and paclitaxel in addition to radiation to the enlarging and hypermetabolic right upper lobe lung mass but the patient had received high dose of curative radiotherapy to this area in the past. If interested I may consider the patient for treatment with a combination of systemic chemotherapy and immunotherapy or single agent immunotherapy with Keytruda based on the final PDL 1 expression.  Other option for her treatment will be palliative care. I will arrange for the patient to come back for follow-up visit in 2-3 weeks for evaluation and discussion of her treatment options based on the final pathology and PDL 1 expression. For smoking cessation, I strongly encouraged the patient to quit smoking. The patient was advised to call immediately if she has any concerning symptoms in the interval.  The patient voices understanding of current disease status and treatment options and is in agreement with the current care plan.  All questions were answered. The patient knows to call the clinic with any problems, questions or concerns. We can certainly see the patient much sooner if necessary.  Thank  you so much for allowing me to participate in the care of KAAREN NASS. I will continue to follow up the patient with you and assist in her care.  I spent 40 minutes counseling the patient face to face. The total time spent in the appointment was 60 minutes.  Disclaimer: This note was dictated with voice recognition software. Similar sounding words can inadvertently be transcribed and may not be corrected upon review.   Eilleen Kempf May 15, 2018, 11:10 AM

## 2018-05-15 NOTE — Telephone Encounter (Signed)
ok 

## 2018-05-15 NOTE — Patient Instructions (Signed)
Steps to Quit Smoking Smoking tobacco can be bad for your health. It can also affect almost every organ in your body. Smoking puts you and people around you at risk for many serious long-lasting (chronic) diseases. Quitting smoking is hard, but it is one of the best things that you can do for your health. It is never too late to quit. What are the benefits of quitting smoking? When you quit smoking, you lower your risk for getting serious diseases and conditions. They can include:  Lung cancer or lung disease.  Heart disease.  Stroke.  Heart attack.  Not being able to have children (infertility).  Weak bones (osteoporosis) and broken bones (fractures).  If you have coughing, wheezing, and shortness of breath, those symptoms may get better when you quit. You may also get sick less often. If you are pregnant, quitting smoking can help to lower your chances of having a baby of low birth weight. What can I do to help me quit smoking? Talk with your doctor about what can help you quit smoking. Some things you can do (strategies) include:  Quitting smoking totally, instead of slowly cutting back how much you smoke over a period of time.  Going to in-person counseling. You are more likely to quit if you go to many counseling sessions.  Using resources and support systems, such as: ? Online chats with a counselor. ? Phone quitlines. ? Printed self-help materials. ? Support groups or group counseling. ? Text messaging programs. ? Mobile phone apps or applications.  Taking medicines. Some of these medicines may have nicotine in them. If you are pregnant or breastfeeding, do not take any medicines to quit smoking unless your doctor says it is okay. Talk with your doctor about counseling or other things that can help you.  Talk with your doctor about using more than one strategy at the same time, such as taking medicines while you are also going to in-person counseling. This can help make  quitting easier. What things can I do to make it easier to quit? Quitting smoking might feel very hard at first, but there is a lot that you can do to make it easier. Take these steps:  Talk to your family and friends. Ask them to support and encourage you.  Call phone quitlines, reach out to support groups, or work with a counselor.  Ask people who smoke to not smoke around you.  Avoid places that make you want (trigger) to smoke, such as: ? Bars. ? Parties. ? Smoke-break areas at work.  Spend time with people who do not smoke.  Lower the stress in your life. Stress can make you want to smoke. Try these things to help your stress: ? Getting regular exercise. ? Deep-breathing exercises. ? Yoga. ? Meditating. ? Doing a body scan. To do this, close your eyes, focus on one area of your body at a time from head to toe, and notice which parts of your body are tense. Try to relax the muscles in those areas.  Download or buy apps on your mobile phone or tablet that can help you stick to your quit plan. There are many free apps, such as QuitGuide from the CDC (Centers for Disease Control and Prevention). You can find more support from smokefree.gov and other websites.  This information is not intended to replace advice given to you by your health care provider. Make sure you discuss any questions you have with your health care provider. Document Released: 04/28/2009 Document   Revised: 02/28/2016 Document Reviewed: 11/16/2014 Elsevier Interactive Patient Education  2018 Elsevier Inc.  

## 2018-05-15 NOTE — Telephone Encounter (Signed)
Daughter called to say [name] husband passed away.  Also pt in donut hole.  Samples of Janumet 50/1000 3 bottles of 14 Lot [id number] exp 06/2019

## 2018-05-16 ENCOUNTER — Telehealth: Payer: Self-pay | Admitting: Internal Medicine

## 2018-05-16 DIAGNOSIS — Z7189 Other specified counseling: Secondary | ICD-10-CM | POA: Insufficient documentation

## 2018-05-16 NOTE — Telephone Encounter (Signed)
Scheduled appt per 10/31 los - pt daughter is aware of appt date and time

## 2018-05-16 NOTE — Telephone Encounter (Signed)
Pt has picked up samples. [place]

## 2018-05-19 ENCOUNTER — Other Ambulatory Visit: Payer: Self-pay

## 2018-05-19 NOTE — Patient Outreach (Signed)
[place] [facility]) Care Management  05/19/2018  [name] [birth year] [id number]    Contacted [name] for follow-up outreach. Call answered by another member who reported that [name] was attending a funeral at the time of the call.  PLAN Will follow up within two weeks.  [place] [phone]

## 2018-05-20 ENCOUNTER — Other Ambulatory Visit: Payer: Medicare Other

## 2018-05-21 ENCOUNTER — Telehealth: Payer: Self-pay

## 2018-05-21 ENCOUNTER — Ambulatory Visit (INDEPENDENT_AMBULATORY_CARE_PROVIDER_SITE_OTHER): Payer: Medicare Other | Admitting: Family Medicine

## 2018-05-21 ENCOUNTER — Encounter: Payer: Self-pay | Admitting: Family Medicine

## 2018-05-21 VITALS — BP 138/76 | HR 88 | Temp 98.1°F | Wt 134.4 lb

## 2018-05-21 DIAGNOSIS — E538 Deficiency of other specified B group vitamins: Secondary | ICD-10-CM | POA: Diagnosis not present

## 2018-05-21 MED ORDER — CYANOCOBALAMIN 1000 MCG/ML IJ SOLN: 1000.0000 ug | Freq: Once | INTRAMUSCULAR | Status: DC

## 2018-05-21 MED ORDER — CYANOCOBALAMIN 1000 MCG/ML IJ SOLN
1000.0000 ug | Freq: Once | INTRAMUSCULAR | Status: AC
  Administered 2018-05-21: 1000 ug via INTRAMUSCULAR

## 2018-05-21 NOTE — Telephone Encounter (Signed)
Nichole Fila, MD  [name], MD  Cc: Marval Regal, RN        I spoke to the patient's daughter over the phone and communicated results of low vitamin B12 and elevated homocystine and the need to see primary physician Dr. Redmond School day urgently to start replacement and work-up for etiology for low B12. She voiced understanding

## 2018-05-21 NOTE — Telephone Encounter (Signed)
-----   Message from Garvin Fila, MD sent at 05/20/2018  8:43 AM EST ----- I spoke to the patient's daughter over the phone and communicated results of low vitamin B12 and elevated homocystine and the need to see primary physician Dr. Redmond School day urgently to start replacement and work-up for etiology for low B12.  She voiced understanding

## 2018-05-21 NOTE — Addendum Note (Signed)
Addended by: Elyse Jarvis on: 05/21/2018 03:42 PM   Modules accepted: Orders

## 2018-05-21 NOTE — Progress Notes (Signed)
   Subjective:    Patient ID: [name], female    DOB: [birth year], 77 y.o.   MRN: [id number]  HPI She is here for evaluation and treatment of vitamin B12 deficiency.  The diagnosis was made recently by neurology and sent here for further evaluation and treatment.   Review of Systems     Objective:   Physical Exam Alert and in no distress otherwise not examined Recent vitamin B12 level was less than 150.       Assessment & Plan:  Vitamin B12 deficiency - Plan: Anti-parietal antibody, Intrinsic Factor Antibodies, Methylmalonic Acid, Serum, cyanocobalamin ((VITAMIN B-12)) injection 1,000 mcg I will start her on daily injections of vitamin B12 for the next week and then weekly after that and eventually on monthly pending results of lab data.  We will try to get her over to oral absorption to see if this will hold her.  She and her daughter are comfortable with this approach.

## 2018-05-22 ENCOUNTER — Other Ambulatory Visit (INDEPENDENT_AMBULATORY_CARE_PROVIDER_SITE_OTHER): Payer: Medicare Other

## 2018-05-22 ENCOUNTER — Institutional Professional Consult (permissible substitution) (INDEPENDENT_AMBULATORY_CARE_PROVIDER_SITE_OTHER): Payer: Medicare Other | Admitting: Thoracic Surgery (Cardiothoracic Vascular Surgery)

## 2018-05-22 VITALS — BP 153/77 | HR 89 | Temp 98.6°F | Resp 17 | Wt 132.8 lb

## 2018-05-22 DIAGNOSIS — E538 Deficiency of other specified B group vitamins: Secondary | ICD-10-CM

## 2018-05-22 DIAGNOSIS — C3491 Malignant neoplasm of unspecified part of right bronchus or lung: Secondary | ICD-10-CM | POA: Diagnosis not present

## 2018-05-22 MED ORDER — CYANOCOBALAMIN 1000 MCG/ML IJ SOLN
1000.0000 ug | Freq: Once | INTRAMUSCULAR | Status: AC
  Administered 2018-05-22: 1000 ug via INTRAMUSCULAR

## 2018-05-22 NOTE — Progress Notes (Signed)
PCP is Lalonde, [name], MD Referring Provider is Mohamed, Mohamed, MD  No chief complaint on file.   HPI: Asked to see [name] re: biopsy of right upper lobe mass  Mrs. Eltringham is a 77 yo woman with a history of ongoing tobacco abuse, stroke, diabetes, neuropathy, hyperlipidemia, and vertigo. She was diagnosed with squamous cell carcinoma of the right upper lobe in 2018. Clinical stage IB (T2N0). She was offered surgery but opted for radiation.  Recently PET showed increased size of the mass with intense uptake c/w residual disease progression.  Denies CP and SOB. + cough, no hemoptysis. Has been feeling poorly which she attributes to B12 deficiency. She has some speech difficulties but is ambulatory with occasional dizziness.  Zubrod Score: At the time of surgery this patient's most appropriate activity status/level should be described as: []    0    Normal activity, no symptoms []    1    Restricted in physical strenuous activity but ambulatory, able to do out light work [x]    2    Ambulatory and capable of self care, unable to do work activities, up and about >50 % of waking hours                              []    3    Only limited self care, in bed greater than 50% of waking hours []    4    Completely disabled, no self care, confined to bed or chair []    5    Moribund  Past Medical History:  Diagnosis Date  . Carotid artery occlusion   . Clostridium difficile infection 06/2013  . Diabetes mellitus    takes Janumet daily  . Dyslipidemia    takes Crestor daily  . Gallstones   . GERD (gastroesophageal reflux disease)   . Heart murmur    hx of  . History of radiation therapy 06/11/17-06/21/17   right lung 50 Gy in 5 fractions  . Hypertension    takes Prinzide and Verapamil daily  . Impaired speech    from stroke  . Neuropathy, diabetic (HCC)   . Pneumonia   . PONV (postoperative nausea and vomiting)   . Seizures (HCC) 04/19/2018   recent hospitalization  . Smoker    . Stroke (HCC) 06/25/13  . Vertigo    but doesn't take any meds    Past Surgical History:  Procedure Laterality Date  . cataract removed Right   . CHOLECYSTECTOMY    . ENDARTERECTOMY Left 07/14/2013   Procedure: ENDARTERECTOMY CAROTID-LEFT;  Surgeon: Charles E Fields, MD;  Location: [facility];  Service: Vascular;  Laterality: Left;  . IR PERC PLEURAL DRAIN W/INDWELL CATH W/IMG GUIDE  05/23/2017  . KNEE SURGERY Left 13yrs ago  . LAPAROSCOPIC PARTIAL COLECTOMY N/A 09/02/2015   Procedure: LAPAROSCOPIC PARTIAL RIGHT COLECTOMY;  Surgeon: Alicia Thomas, MD;  Location: [facility];  Service: General;  Laterality: N/A;  . PATCH ANGIOPLASTY Left 07/14/2013   Procedure: LEFT CAROTID ARTERY PATCH ANGIOPLASTY;  Surgeon: Charles E Fields, MD;  Location: [facility];  Service: Vascular;  Laterality: Left;    Family History  Problem Relation Age of Onset  . Leukemia Mother   . Diabetes Mother   . Cancer Father        esophageal ca  . Heart disease Father   . Diabetes Maternal Aunt   . Diabetes Maternal Uncle   .   Diabetes Brother   . Diabetes Sister   . Heart attack Brother        x 2    Social History Social History   Tobacco Use  . Smoking status: Current Every Day Smoker    Packs/day: 0.50    Years: 60.00    Pack years: 30.00    Types: Cigarettes  . Smokeless tobacco: Never Used  Substance Use Topics  . Alcohol use: No    Alcohol/week: 0.0 standard drinks  . Drug use: No    Current Outpatient Medications  Medication Sig Dispense Refill  . acetaminophen (TYLENOL) 325 MG tablet Take 325 mg by mouth every 6 (six) hours as needed for mild pain.     . aspirin EC 81 MG tablet Take 81 mg by mouth daily.    . Blood Glucose Monitoring Suppl (ONE TOUCH ULTRA 2) w/Device KIT 1 Device by Does not apply route 2 (two) times daily. 1 each 0  . clopidogrel (PLAVIX) 75 MG tablet TAKE 1 TABLET BY MOUTH  DAILY (Patient taking differently: Take 75 mg by mouth daily. ) 90 tablet 3  . divalproex (DEPAKOTE ER)  500 MG 24 hr tablet Take 1 tablet (500 mg total) by mouth daily. 30 tablet 3  . glucose blood (ONE TOUCH ULTRA TEST) test strip Use as instructed 100 each 12  . JANUMET 50-1000 MG tablet TAKE 1 TABLET BY MOUTH TWICE DAILY 180 tablet 0  . Lancets (ONETOUCH ULTRASOFT) lancets Use as instructed 100 each 12  . magnesium oxide (MAG-OX) 400 (241.3 Mg) MG tablet Take 1 tablet (400 mg total) by mouth 2 (two) times daily. 60 tablet 0  . OVER THE COUNTER MEDICATION 5 mLs.    . rosuvastatin (CRESTOR) 20 MG tablet TAKE 1 TABLET BY MOUTH ONCE DAILY 180 tablet 0  . sitaGLIPtin-metformin (JANUMET) 50-1000 MG tablet Take 1 tablet by mouth 2 (two) times daily. (Patient taking differently: Take 1 tablet by mouth 2 (two) times daily before a meal. ) 120 tablet 0   No current facility-administered medications for this visit.     Allergies  Allergen Reactions  . Codeine Nausea And Vomiting  . Lipitor [Atorvastatin] Nausea And Vomiting  . Phenergan [Promethazine Hcl] Other (See Comments)    Confusion, hallucinations, severe agitation    Review of Systems  Constitutional: Positive for activity change and fatigue.  Respiratory: Positive for cough. Negative for shortness of breath.   Cardiovascular: Negative for chest pain.  Gastrointestinal: Positive for abdominal pain (reflux).  Neurological: Positive for dizziness and speech difficulty.    BP (!) 153/77   Pulse 89   Temp 98.6 F (37 C)   Resp 17   Wt 132 lb 12.8 oz (60.2 kg)   SpO2 98%   BMI 21.11 kg/m  Physical Exam  Constitutional: She is oriented to person, place, and time. She appears well-developed and well-nourished. No distress.  HENT:  Head: Normocephalic and atraumatic.  Mouth/Throat: No oropharyngeal exudate.  Eyes: Conjunctivae are normal. No scleral icterus.  Neck: Neck supple. No thyromegaly present.  Cardiovascular: Normal rate and regular rhythm. Exam reveals no gallop and no friction rub.  Murmur (2/6 systolic)  heard. Pulmonary/Chest: Effort normal and breath sounds normal. No respiratory distress. She has no wheezes.  Abdominal: Soft. She exhibits no distension. There is no tenderness.  Musculoskeletal: She exhibits no edema.  Lymphadenopathy:    She has no cervical adenopathy.  Neurological: She is alert and oriented to person, place, and time. No cranial nerve   deficit. She exhibits normal muscle tone.  Skin: Skin is warm and dry.  Vitals reviewed.    Diagnostic Tests: NUCLEAR MEDICINE PET SKULL BASE TO THIGH  TECHNIQUE: 6.33 mCi F-18 FDG was injected intravenously. Full-ring PET imaging was performed from the skull base to thigh after the radiotracer. CT data was obtained and used for attenuation correction and anatomic localization.  Fasting blood glucose: 201 mg/dl  COMPARISON:  PET-CT 04/26/2017 and CT scan 10/28/2017 and 02/27/2018  FINDINGS: Mediastinal blood pool activity: SUV max 2.44  NECK: No hypermetabolic lymph nodes in the neck.  Incidental CT findings: none  CHEST: 6.3 x 5.4 cm right upper lobe lung mass extending to the right hilum and the peripheral pleural surface. Areas of necrosis are suggested. The SUV max is 12.64. Findings consistent with progressive tumor. There are surrounding radiation changes  Small right paratracheal and precarinal lymph nodes all measure less than 8 mm. No hypermetabolism.  No pulmonary nodules to suggest pulmonary metastatic disease. There is a small right pleural effusion.  Incidental CT findings: Stable calcified coronary arteries.  ABDOMEN/PELVIS: No abnormal hypermetabolic activity within the liver, pancreas, adrenal glands, or spleen. No hypermetabolic lymph nodes in the abdomen or pelvis.  Incidental CT findings: Extensive atherosclerotic calcifications involving the aorta and branch vessels.  Evidence of prior right hemicolectomy.  SKELETON: No focal hypermetabolic activity to suggest  skeletal metastasis.  Incidental CT findings: none  IMPRESSION: 1. Enlarging right upper lobe lung mass since the prior PET-CT. It previously measured 4.1 x 3.1 cm and now measures 6.3 x 5.4 cm. Marked hypermetabolism consistent with progressive tumor. 2. No enlarged or hypermetabolic mediastinal or hilar lymph nodes. 3. No findings for pulmonary metastatic nodules. 4. No findings to suggest metastatic disease involving the abdomen/pelvis or bony structures.   Electronically Signed   By: P.  Gallerani M.D.   On: 05/07/2018 13:42 I personally reviewed the PET images and concur with the findings noted above  Impression: Mrs. Knieriem is a 77 yo woman with a known squamous cell carcinoma of the right upper lobe, clinical stage IB (T2N0) diagnosed in 2018 and treated with radiation after she refused surgery. Now has progression of disease at site. No evidence of adenopathy or distant mets. Dr. Mohamed would like a repeat biopsy to obtain additional tissue for PDL1 testing.  I discussed the general nature of the procedure with the patient and her daughter. Will plan to do under general anesthesia to hopefully obtain enough tissue for molecular testing. We will plan to do as an outpatient. They understand the risks include, but are not limited to death, stroke, MI, DVT/PE, bleeding, pneumothorax, as well as failure to obtain sufficient tissue.   She is not sure whether she wants to pursue a biopsy. I encouraged her to think over her options. She will call to schedule if she decides to proceed.  Plan: Bronchoscopy for biopsy of right upper lobe mass. Patient will call to schedule if she decides to proceed Will need to be off Plavix for 5 days prior to procedure  Sabri Teal C Delainie Chavana, MD [facility] [phone] 

## 2018-05-22 NOTE — H&P (View-Only) (Signed)
PCP is [name], MD Referring Provider is Curt Bears, MD  No chief complaint on file.   HPI: Asked to see [name] re: biopsy of right upper lobe mass  [name] is a 77 yo woman with a history of ongoing tobacco abuse, stroke, diabetes, neuropathy, hyperlipidemia, and vertigo. She was diagnosed with squamous cell carcinoma of the right upper lobe in 2018. Clinical stage IB (T2N0). She was offered surgery but opted for radiation.  Recently PET showed increased size of the mass with intense uptake c/w residual disease progression.  Denies CP and SOB. + cough, no hemoptysis. Has been feeling poorly which she attributes to B12 deficiency. She has some speech difficulties but is ambulatory with occasional dizziness.  Zubrod Score: At the time of surgery this patient's most appropriate activity status/level should be described as: _0     0    Normal activity, no symptoms _1     1    Restricted in physical strenuous activity but ambulatory, able to do out light work _2     2    Ambulatory and capable of self care, unable to do work activities, up and about >50 % of waking hours                              _3     3    Only limited self care, in bed greater than 50% of waking hours _4     4    Completely disabled, no self care, confined to bed or chair _5     5    Moribund  Past Medical History:  Diagnosis Date  . Carotid artery occlusion   . Clostridium difficile infection 06/2013  . Diabetes mellitus    takes Janumet daily  . Dyslipidemia    takes Crestor daily  . Gallstones   . GERD (gastroesophageal reflux disease)   . Heart murmur    hx of  . History of radiation therapy 06/11/17-06/21/17   right lung 50 Gy in 5 fractions  . Hypertension    takes Prinzide and Verapamil daily  . Impaired speech    from stroke  . Neuropathy, diabetic ([place])   . Pneumonia   . PONV (postoperative nausea and vomiting)   . Seizures ([place]) 04/19/2018   recent hospitalization  . Smoker    . Stroke ([place]) 06/25/13  . Vertigo    but doesn't take any meds    Past Surgical History:  Procedure Laterality Date  . cataract removed Right   . CHOLECYSTECTOMY    . ENDARTERECTOMY Left 07/14/2013   Procedure: ENDARTERECTOMY CAROTID-LEFT;  Surgeon: Elam Dutch, MD;  Location: [facility];  Service: Vascular;  Laterality: Left;  . IR PERC PLEURAL DRAIN W/INDWELL CATH W/IMG GUIDE  05/23/2017  . KNEE SURGERY Left 61yr ago  . LAPAROSCOPIC PARTIAL COLECTOMY N/A 09/02/2015   Procedure: LAPAROSCOPIC PARTIAL RIGHT COLECTOMY;  Surgeon: ALeighton Ruff MD;  Location: [facility];  Service: General;  Laterality: N/A;  . PATCH ANGIOPLASTY Left 07/14/2013   Procedure: LEFT CAROTID ARTERY PATCH ANGIOPLASTY;  Surgeon: CElam Dutch MD;  [facility];  Service: Vascular;  Laterality: Left;    Family History  Problem Relation Age of Onset  . Leukemia Mother   . Diabetes Mother   . Cancer Father        esophageal ca  . Heart disease Father   . Diabetes Maternal Aunt   . Diabetes Maternal Uncle   .  Diabetes Brother   . Diabetes Sister   . Heart attack Brother        x 2    Social History Social History   Tobacco Use  . Smoking status: Current Every Day Smoker    Packs/day: 0.50    Years: 60.00    Pack years: 30.00    Types: Cigarettes  . Smokeless tobacco: Never Used  Substance Use Topics  . Alcohol use: No    Alcohol/week: 0.0 standard drinks  . Drug use: No    Current Outpatient Medications  Medication Sig Dispense Refill  . acetaminophen (TYLENOL) 325 MG tablet Take 325 mg by mouth every 6 (six) hours as needed for mild pain.     Marland Kitchen aspirin EC 81 MG tablet Take 81 mg by mouth daily.    . Blood Glucose Monitoring Suppl (ONE TOUCH ULTRA 2) w/Device KIT 1 Device by Does not apply route 2 (two) times daily. 1 each 0  . clopidogrel (PLAVIX) 75 MG tablet TAKE 1 TABLET BY MOUTH  DAILY (Patient taking differently: Take 75 mg by mouth daily. ) 90 tablet 3  . divalproex (DEPAKOTE ER)  500 MG 24 hr tablet Take 1 tablet (500 mg total) by mouth daily. 30 tablet 3  . glucose blood (ONE TOUCH ULTRA TEST) test strip Use as instructed 100 each 12  . JANUMET 50-1000 MG tablet TAKE 1 TABLET BY MOUTH TWICE DAILY 180 tablet 0  . Lancets (ONETOUCH ULTRASOFT) lancets Use as instructed 100 each 12  . magnesium oxide (MAG-OX) 400 (241.3 Mg) MG tablet Take 1 tablet (400 mg total) by mouth 2 (two) times daily. 60 tablet 0  . OVER THE COUNTER MEDICATION 5 mLs.    . rosuvastatin (CRESTOR) 20 MG tablet TAKE 1 TABLET BY MOUTH ONCE DAILY 180 tablet 0  . sitaGLIPtin-metformin (JANUMET) 50-1000 MG tablet Take 1 tablet by mouth 2 (two) times daily. (Patient taking differently: Take 1 tablet by mouth 2 (two) times daily before a meal. ) 120 tablet 0   No current facility-administered medications for this visit.     Allergies  Allergen Reactions  . Codeine Nausea And Vomiting  . Lipitor [Atorvastatin] Nausea And Vomiting  . Phenergan [Promethazine Hcl] Other (See Comments)    Confusion, hallucinations, severe agitation    Review of Systems  Constitutional: Positive for activity change and fatigue.  Respiratory: Positive for cough. Negative for shortness of breath.   Cardiovascular: Negative for chest pain.  Gastrointestinal: Positive for abdominal pain (reflux).  Neurological: Positive for dizziness and speech difficulty.    BP (!) 153/77   Pulse 89   Temp 98.6 F (37 C)   Resp 17   Wt 132 lb 12.8 oz (60.2 kg)   SpO2 98%   BMI 21.11 kg/m  Physical Exam  Constitutional: She is oriented to person, place, and time. She appears well-developed and well-nourished. No distress.  HENT:  Head: Normocephalic and atraumatic.  Mouth/Throat: No oropharyngeal exudate.  Eyes: Conjunctivae are normal. No scleral icterus.  Neck: Neck supple. No thyromegaly present.  Cardiovascular: Normal rate and regular rhythm. Exam reveals no gallop and no friction rub.  Murmur (2/6 systolic)  heard. Pulmonary/Chest: Effort normal and breath sounds normal. No respiratory distress. She has no wheezes.  Abdominal: Soft. She exhibits no distension. There is no tenderness.  Musculoskeletal: She exhibits no edema.  Lymphadenopathy:    She has no cervical adenopathy.  Neurological: She is alert and oriented to person, place, and time. No cranial nerve  deficit. She exhibits normal muscle tone.  Skin: Skin is warm and dry.  Vitals reviewed.    Diagnostic Tests: NUCLEAR MEDICINE PET SKULL BASE TO THIGH  TECHNIQUE: 6.33 mCi F-18 FDG was injected intravenously. Full-ring PET imaging was performed from the skull base to thigh after the radiotracer. CT data was obtained and used for attenuation correction and anatomic localization.  Fasting blood glucose: 201 mg/dl  COMPARISON:  PET-CT 04/26/2017 and CT scan 10/28/2017 and 02/27/2018  FINDINGS: Mediastinal blood pool activity: SUV max 2.44  NECK: No hypermetabolic lymph nodes in the neck.  Incidental CT findings: none  CHEST: 6.3 x 5.4 cm right upper lobe lung mass extending to the right hilum and the peripheral pleural surface. Areas of necrosis are suggested. The SUV max is 12.64. Findings consistent with progressive tumor. There are surrounding radiation changes  Small right paratracheal and precarinal lymph nodes all measure less than 8 mm. No hypermetabolism.  No pulmonary nodules to suggest pulmonary metastatic disease. There is a small right pleural effusion.  Incidental CT findings: Stable calcified coronary arteries.  ABDOMEN/PELVIS: No abnormal hypermetabolic activity within the liver, pancreas, adrenal glands, or spleen. No hypermetabolic lymph nodes in the abdomen or pelvis.  Incidental CT findings: Extensive atherosclerotic calcifications involving the aorta and branch vessels.  Evidence of prior right hemicolectomy.  SKELETON: No focal hypermetabolic activity to suggest  skeletal metastasis.  Incidental CT findings: none  IMPRESSION: 1. Enlarging right upper lobe lung mass since the prior PET-CT. It previously measured 4.1 x 3.1 cm and now measures 6.3 x 5.4 cm. Marked hypermetabolism consistent with progressive tumor. 2. No enlarged or hypermetabolic mediastinal or hilar lymph nodes. 3. No findings for pulmonary metastatic nodules. 4. No findings to suggest metastatic disease involving the abdomen/pelvis or bony structures.   Electronically Signed   By: Marijo Sanes M.D.   On: 05/07/2018 13:42 I personally reviewed the PET images and concur with the findings noted above  Impression: [name] is a 77 yo woman with a known squamous cell carcinoma of the right upper lobe, clinical stage IB (T2N0) diagnosed in 2018 and treated with radiation after she refused surgery. Now has progression of disease at site. No evidence of adenopathy or distant mets. Dr. Julien Nordmann would like a repeat biopsy to obtain additional tissue for PDL1 testing.  I discussed the general nature of the procedure with the patient and her daughter. Will plan to do under general anesthesia to hopefully obtain enough tissue for molecular testing. We will plan to do as an outpatient. They understand the risks include, but are not limited to death, stroke, MI, DVT/PE, bleeding, pneumothorax, as well as failure to obtain sufficient tissue.   She is not sure whether she wants to pursue a biopsy. I encouraged her to think over her options. She will call to schedule if she decides to proceed.  Plan: Bronchoscopy for biopsy of right upper lobe mass. Patient will call to schedule if she decides to proceed Will need to be off Plavix for 5 days prior to procedure  Melrose Nakayama, MD [facility] [phone]

## 2018-05-23 ENCOUNTER — Other Ambulatory Visit (INDEPENDENT_AMBULATORY_CARE_PROVIDER_SITE_OTHER): Payer: Medicare Other

## 2018-05-23 DIAGNOSIS — E538 Deficiency of other specified B group vitamins: Secondary | ICD-10-CM

## 2018-05-23 MED ORDER — CYANOCOBALAMIN 1000 MCG/ML IJ SOLN
1000.0000 ug | Freq: Once | INTRAMUSCULAR | Status: AC
  Administered 2018-05-23: 1000 ug via INTRAMUSCULAR

## 2018-05-24 LAB — INTRINSIC FACTOR ANTIBODIES: Intrinsic Factor Abs, Serum: 7 AU/mL — ABNORMAL HIGH (ref 0.0–1.1)

## 2018-05-24 LAB — METHYLMALONIC ACID, SERUM: Methylmalonic Acid: 942 nmol/L — ABNORMAL HIGH (ref 0–378)

## 2018-05-24 LAB — ANTI-PARIETAL ANTIBODY: PARIETAL CELL AB: 6.4 U (ref 0.0–20.0)

## 2018-05-26 ENCOUNTER — Other Ambulatory Visit: Payer: Self-pay | Admitting: *Deleted

## 2018-05-26 ENCOUNTER — Other Ambulatory Visit (INDEPENDENT_AMBULATORY_CARE_PROVIDER_SITE_OTHER): Payer: Medicare Other

## 2018-05-26 DIAGNOSIS — E538 Deficiency of other specified B group vitamins: Secondary | ICD-10-CM

## 2018-05-26 DIAGNOSIS — R918 Other nonspecific abnormal finding of lung field: Secondary | ICD-10-CM

## 2018-05-26 MED ORDER — CYANOCOBALAMIN 1000 MCG/ML IJ SOLN
1000.0000 ug | Freq: Once | INTRAMUSCULAR | Status: AC
  Administered 2018-05-26: 1000 ug via INTRAMUSCULAR

## 2018-05-27 ENCOUNTER — Other Ambulatory Visit (INDEPENDENT_AMBULATORY_CARE_PROVIDER_SITE_OTHER): Payer: Medicare Other

## 2018-05-27 DIAGNOSIS — E538 Deficiency of other specified B group vitamins: Secondary | ICD-10-CM

## 2018-05-27 MED ORDER — CYANOCOBALAMIN 1000 MCG/ML IJ SOLN
1000.0000 ug | Freq: Once | INTRAMUSCULAR | Status: AC
  Administered 2018-05-27: 1000 ug via INTRAMUSCULAR

## 2018-05-28 ENCOUNTER — Encounter: Payer: Self-pay | Admitting: *Deleted

## 2018-05-28 LAB — VITAMIN B12

## 2018-05-28 NOTE — Progress Notes (Signed)
Oncology Nurse Navigator Documentation  Oncology Nurse Navigator Flowsheets 05/28/2018  Navigator Location [facility]  Navigator Encounter Type Other/I updated [name] on [name] biopsy scheduled for 06/04/18.  He would like to see her week of 06/09/18 with possible treatment plan concurrent chemo rad.  I will update [name] on plan.   Treatment Phase Pre-Tx/Tx Discussion  Barriers/Navigation Needs Coordination of Care  Interventions Coordination of Care  Coordination of Care Other  Acuity Level 3  Time Spent with Patient 45

## 2018-05-30 ENCOUNTER — Encounter: Payer: Self-pay | Admitting: *Deleted

## 2018-05-30 ENCOUNTER — Telehealth: Payer: Self-pay | Admitting: Oncology

## 2018-05-30 NOTE — Progress Notes (Signed)
Oncology Nurse Navigator Documentation  Oncology Nurse Navigator Flowsheets 05/30/2018  Navigator Location [facility]  Navigator Encounter Type Other/Patient needs to be seen after her biopsy not before. I completed another scheduling message to cancel appt with APP on 06/02/18 and re-schedule with Dr. Julien Nordmann or APP week of 06/09/18 per Dr. Worthy Flank request.   Treatment Phase Abnormal Scans  Barriers/Navigation Needs Coordination of Care  Interventions Coordination of Care  Coordination of Care Other  Acuity Level 1  Time Spent with Patient 30

## 2018-05-30 NOTE — Telephone Encounter (Signed)
Called regarding change for 11/25

## 2018-06-01 ENCOUNTER — Ambulatory Visit
Admission: RE | Admit: 2018-06-01 | Discharge: 2018-06-01 | Disposition: A | Discharge status: Home / Self Care | Payer: Medicare Other | Source: Ambulatory Visit | Attending: Neurology | Admitting: Neurology

## 2018-06-01 DIAGNOSIS — R200 Anesthesia of skin: Secondary | ICD-10-CM | POA: Diagnosis not present

## 2018-06-01 DIAGNOSIS — G3184 Mild cognitive impairment, so stated: Secondary | ICD-10-CM | POA: Diagnosis not present

## 2018-06-01 MED ORDER — GADOBENATE DIMEGLUMINE 529 MG/ML IV SOLN
10.0000 mL | Freq: Once | INTRAVENOUS | Status: AC | PRN
  Administered 2018-06-01: 10 mL via INTRAVENOUS

## 2018-06-02 ENCOUNTER — Encounter (HOSPITAL_COMMUNITY): Payer: Self-pay

## 2018-06-02 ENCOUNTER — Other Ambulatory Visit: Payer: Self-pay

## 2018-06-02 ENCOUNTER — Encounter (HOSPITAL_COMMUNITY)
Admission: RE | Admit: 2018-06-02 | Discharge: 2018-06-02 | Disposition: A | Discharge status: Home / Self Care | Payer: Medicare Other | Source: Ambulatory Visit | Attending: Thoracic Surgery (Cardiothoracic Vascular Surgery) | Admitting: Thoracic Surgery (Cardiothoracic Vascular Surgery)

## 2018-06-02 ENCOUNTER — Inpatient Hospital Stay: Payer: Medicare Other

## 2018-06-02 ENCOUNTER — Ambulatory Visit (HOSPITAL_COMMUNITY)
Admission: RE | Admit: 2018-06-02 | Discharge: 2018-06-02 | Disposition: A | Discharge status: Home / Self Care | Payer: Medicare Other | Source: Ambulatory Visit | Attending: Thoracic Surgery (Cardiothoracic Vascular Surgery) | Admitting: Thoracic Surgery (Cardiothoracic Vascular Surgery)

## 2018-06-02 ENCOUNTER — Inpatient Hospital Stay: Payer: Medicare Other | Admitting: Oncology

## 2018-06-02 ENCOUNTER — Ambulatory Visit: Payer: Self-pay | Admitting: Pharmacist

## 2018-06-02 ENCOUNTER — Other Ambulatory Visit: Payer: Self-pay | Admitting: Neurology

## 2018-06-02 DIAGNOSIS — R918 Other nonspecific abnormal finding of lung field: Secondary | ICD-10-CM | POA: Insufficient documentation

## 2018-06-02 DIAGNOSIS — I700 Atherosclerosis of aorta: Secondary | ICD-10-CM | POA: Diagnosis not present

## 2018-06-02 DIAGNOSIS — Z7984 Long term (current) use of oral hypoglycemic drugs: Secondary | ICD-10-CM | POA: Insufficient documentation

## 2018-06-02 DIAGNOSIS — E1165 Type 2 diabetes mellitus with hyperglycemia: Secondary | ICD-10-CM | POA: Diagnosis not present

## 2018-06-02 DIAGNOSIS — R413 Other amnesia: Secondary | ICD-10-CM | POA: Insufficient documentation

## 2018-06-02 DIAGNOSIS — Z8673 Personal history of transient ischemic attack (TIA), and cerebral infarction without residual deficits: Secondary | ICD-10-CM | POA: Diagnosis not present

## 2018-06-02 DIAGNOSIS — K219 Gastro-esophageal reflux disease without esophagitis: Secondary | ICD-10-CM | POA: Diagnosis not present

## 2018-06-02 DIAGNOSIS — Z85118 Personal history of other malignant neoplasm of bronchus and lung: Secondary | ICD-10-CM | POA: Insufficient documentation

## 2018-06-02 DIAGNOSIS — I10 Essential (primary) hypertension: Secondary | ICD-10-CM | POA: Diagnosis not present

## 2018-06-02 DIAGNOSIS — Z923 Personal history of irradiation: Secondary | ICD-10-CM | POA: Insufficient documentation

## 2018-06-02 DIAGNOSIS — Z9049 Acquired absence of other specified parts of digestive tract: Secondary | ICD-10-CM | POA: Insufficient documentation

## 2018-06-02 DIAGNOSIS — J449 Chronic obstructive pulmonary disease, unspecified: Secondary | ICD-10-CM | POA: Insufficient documentation

## 2018-06-02 DIAGNOSIS — E785 Hyperlipidemia, unspecified: Secondary | ICD-10-CM | POA: Diagnosis not present

## 2018-06-02 DIAGNOSIS — Z79899 Other long term (current) drug therapy: Secondary | ICD-10-CM | POA: Insufficient documentation

## 2018-06-02 DIAGNOSIS — Z7902 Long term (current) use of antithrombotics/antiplatelets: Secondary | ICD-10-CM | POA: Diagnosis not present

## 2018-06-02 DIAGNOSIS — Z7982 Long term (current) use of aspirin: Secondary | ICD-10-CM | POA: Insufficient documentation

## 2018-06-02 DIAGNOSIS — Z01818 Encounter for other preprocedural examination: Secondary | ICD-10-CM | POA: Diagnosis not present

## 2018-06-02 DIAGNOSIS — J90 Pleural effusion, not elsewhere classified: Secondary | ICD-10-CM | POA: Diagnosis not present

## 2018-06-02 DIAGNOSIS — Z9862 Peripheral vascular angioplasty status: Secondary | ICD-10-CM | POA: Diagnosis not present

## 2018-06-02 DIAGNOSIS — F17200 Nicotine dependence, unspecified, uncomplicated: Secondary | ICD-10-CM | POA: Insufficient documentation

## 2018-06-02 DIAGNOSIS — E1140 Type 2 diabetes mellitus with diabetic neuropathy, unspecified: Secondary | ICD-10-CM | POA: Insufficient documentation

## 2018-06-02 DIAGNOSIS — C7931 Secondary malignant neoplasm of brain: Secondary | ICD-10-CM

## 2018-06-02 HISTORY — DX: Malignant (primary) neoplasm, unspecified: C80.1

## 2018-06-02 LAB — COMPREHENSIVE METABOLIC PANEL
ALT: 11 U/L (ref 0–44)
AST: 19 U/L (ref 15–41)
Albumin: 2.6 g/dL — ABNORMAL LOW (ref 3.5–5.0)
Alkaline Phosphatase: 66 U/L (ref 38–126)
Anion gap: 7 (ref 5–15)
BUN: 10 mg/dL (ref 8–23)
CO2: 28 mmol/L (ref 22–32)
Calcium: 9.3 mg/dL (ref 8.9–10.3)
Chloride: 105 mmol/L (ref 98–111)
Creatinine, Ser: 0.93 mg/dL (ref 0.44–1.00)
GFR calc Af Amer: >60 mL/min (ref >60)
GFR calc non Af Amer: 58 mL/min — ABNORMAL LOW (ref >60)
Glucose, Bld: 140 mg/dL — ABNORMAL HIGH (ref 70–99)
Potassium: 3.7 mmol/L (ref 3.5–5.1)
Sodium: 140 mmol/L (ref 135–145)
Total Bilirubin: 0.2 mg/dL — ABNORMAL LOW (ref 0.3–1.2)
Total Protein: 6.4 g/dL — ABNORMAL LOW (ref 6.5–8.1)

## 2018-06-02 LAB — CBC
HCT: 33.8 % — ABNORMAL LOW (ref 36.0–46.0)
Hemoglobin: 9.7 g/dL — ABNORMAL LOW (ref 12.0–15.0)
MCH: 26.1 pg (ref 26.0–34.0)
MCHC: 28.7 g/dL — ABNORMAL LOW (ref 30.0–36.0)
MCV: 91.1 fL (ref 80.0–100.0)
Platelets: 849 10*3/uL — ABNORMAL HIGH (ref 150–400)
RBC: 3.71 MIL/uL — ABNORMAL LOW (ref 3.87–5.11)
RDW: 17.2 % — ABNORMAL HIGH (ref 11.5–15.5)
WBC: 16.7 10*3/uL — ABNORMAL HIGH (ref 4.0–10.5)
nRBC: 0 % (ref 0.0–0.2)

## 2018-06-02 LAB — APTT: aPTT: 30 seconds (ref 24–36)

## 2018-06-02 LAB — PROTIME-INR
INR: 1.06
Prothrombin Time: 13.7 seconds (ref 11.4–15.2)

## 2018-06-02 LAB — GLUCOSE, CAPILLARY: Glucose-Capillary: 136 mg/dL — ABNORMAL HIGH (ref 70–99)

## 2018-06-02 NOTE — Progress Notes (Signed)
PCP - Jill Alexanders Md  Chest x-ray -  06/02/18 EKG - 05/05/18 ECHO - 2019  Fasting Blood Sugar - 200s Checks Blood Sugar: hasn't check it since CA diagnosis. Hgb A1C is 9.4 from 04/09/18  Blood Thinner Instructions: Plavix last dose on 03/29/18 Aspirin Instructions: Continue until DOS  Anesthesia review: EKG review. Elevated A1C  Patient denies shortness of breath, fever, cough and chest pain at PAT appointment   Patient verbalized understanding of instructions that were given to them at the PAT appointment. Patient was also instructed that they will need to review over the PAT instructions again at home before surgery.

## 2018-06-02 NOTE — Pre-Procedure Instructions (Signed)
ANALEAH BRAME  06/02/2018      [facility] [phone] - [place], [place] - [place] AT [place] [address] [place] [address] Phone: [phone] Fax: [phone]  [place], [place] [facility] [address] [place] [address] [place] [address] Phone: [phone] Fax: [phone]    Your procedure is scheduled on Wednesday November 20th.  Report to [facility] Admitting at 1030 A.M.  Call this number if you have problems the morning of surgery:  [phone]   Remember:  Do not eat or drink after midnight.    Take these medicines the morning of surgery with A SIP OF WATER   divalproex (DEPAKOTE ER)   rosuvastatin (CRESTOR)  Follow your surgeon's instructions on when to stop/resume your Aspirin and Plavix. If no instructions were given to you, call your surgeon's office.   7 days prior to surgery STOP taking any Aspirin(unless otherwise instructed by your surgeon), Aleve, Naproxen, Ibuprofen, Motrin, Advil, Goody's, BC's, all herbal medications, fish oil, and all vitamins   WHAT DO I DO ABOUT MY DIABETES MEDICATION?   Marland Kitchen Do not take oral diabetes medicines (pills) the morning of surgery: sitaGLIPtin-metformin (JANUMET).  . The day of surgery, do not take other diabetes injectables, including Byetta (exenatide), Bydureon (exenatide ER), Victoza (liraglutide), or Trulicity (dulaglutide).  . If your CBG is greater than 220 mg/dL, you may take  of your sliding scale (correction) dose of insulin.   How to Manage Your Diabetes Before and After Surgery  Why is it important to control my blood sugar before and after surgery? . Improving blood sugar levels before and after surgery helps healing and can limit problems. . A way of improving blood sugar control is eating a healthy diet by: o  Eating less sugar and carbohydrates o  Increasing activity/exercise o  Talking with  your doctor about reaching your blood sugar goals . High blood sugars (greater than 180 mg/dL) can raise your risk of infections and slow your recovery, so you will need to focus on controlling your diabetes during the weeks before surgery. . Make sure that the doctor who takes care of your diabetes knows about your planned surgery including the date and location.  How do I manage my blood sugar before surgery? . Check your blood sugar at least 4 times a day, starting 2 days before surgery, to make sure that the level is not too high or low. o Check your blood sugar the morning of your surgery when you wake up and every 2 hours until you get to the Short Stay unit. . If your blood sugar is less than 70 mg/dL, you will need to treat for low blood sugar: o Do not take insulin. o Treat a low blood sugar (less than 70 mg/dL) with  cup of clear juice (cranberry or apple), 4 glucose tablets, OR glucose gel. o Recheck blood sugar in 15 minutes after treatment (to make sure it is greater than 70 mg/dL). If your blood sugar is not greater than 70 mg/dL on recheck, call [phone] for further instructions. . Report your blood sugar to the short stay nurse when you get to Short Stay.  . If you are admitted to the hospital after surgery: o Your blood sugar will be checked by the staff and you will probably be given insulin after surgery (instead of oral diabetes medicines) to make sure you have good  blood sugar levels. o The goal for blood sugar control after surgery is 80-180 mg/dL.    Do not wear jewelry, make-up or nail polish.  Do not wear lotions, powders, or perfumes, or deodorant.  Do not shave 48 hours prior to surgery.  Men may shave face and neck.  Do not bring valuables to the hospital.  [facility] is not responsible for any belongings or valuables.  Contacts, dentures or bridgework may not be worn into surgery.  Leave your suitcase in the car.  After surgery it may be brought to your  room.  For patients admitted to the hospital, discharge time will be determined by your treatment team.  Patients discharged the day of surgery will not be allowed to drive home.    [facility]- Preparing For Surgery  Before surgery, you can play an important role. Because skin is not sterile, your skin needs to be as free of germs as possible. You can reduce the number of germs on your skin by washing with CHG (chlorahexidine gluconate) Soap before surgery.  CHG is an antiseptic cleaner which kills germs and bonds with the skin to continue killing germs even after washing.    Oral Hygiene is also important to reduce your risk of infection.  Remember - BRUSH YOUR TEETH THE MORNING OF SURGERY WITH YOUR REGULAR TOOTHPASTE  Please do not use if you have an allergy to CHG or antibacterial soaps. If your skin becomes reddened/irritated stop using the CHG.  Do not shave (including legs and underarms) for at least 48 hours prior to first CHG shower. It is OK to shave your face.  Please follow these instructions carefully.   1. Shower the NIGHT BEFORE SURGERY and the MORNING OF SURGERY with CHG.   2. If you chose to wash your hair, wash your hair first as usual with your normal shampoo.  3. After you shampoo, rinse your hair and body thoroughly to remove the shampoo.  4. Use CHG as you would any other liquid soap. You can apply CHG directly to the skin and wash gently with a scrungie or a clean washcloth.   5. Apply the CHG Soap to your body ONLY FROM THE NECK DOWN.  Do not use on open wounds or open sores. Avoid contact with your eyes, ears, mouth and genitals (private parts). Wash Face and genitals (private parts)  with your normal soap.  6. Wash thoroughly, paying special attention to the area where your surgery will be performed.  7. Thoroughly rinse your body with warm water from the neck down.  8. DO NOT shower/wash with your normal soap after using and rinsing off the CHG  Soap.  9. Pat yourself dry with a CLEAN TOWEL.  10. Wear CLEAN PAJAMAS to bed the night before surgery, wear comfortable clothes the morning of surgery  11. Place CLEAN SHEETS on your bed the night of your first shower and DO NOT SLEEP WITH PETS.    Day of Surgery:  Do not apply any deodorants/lotions.  Please wear clean clothes to the hospital/surgery center.   Remember to brush your teeth WITH YOUR REGULAR TOOTHPASTE.    Please read over the following fact sheets that you were given.

## 2018-06-03 ENCOUNTER — Other Ambulatory Visit: Payer: Self-pay

## 2018-06-03 NOTE — Anesthesia Preprocedure Evaluation (Addendum)
Anesthesia Evaluation  Patient identified by MRN, date of birth, ID band Patient awake    Reviewed: Allergy & Precautions, NPO status , Patient's Chart, lab work & pertinent test results  History of Anesthesia Complications (+) PONV and history of anesthetic complications  Airway Mallampati: II  TM Distance: >3 FB Neck ROM: Full    Dental  (+) Poor Dentition, Missing,    Pulmonary Current Smoker,     + decreased breath sounds      Cardiovascular hypertension, (-) angina(-) Past MI  Rhythm:Regular     Neuro/Psych  Headaches, Seizures -, Well Controlled,  TIACVA    GI/Hepatic GERD  Controlled,  Endo/Other  diabetes, Type 2  Renal/GU negative Renal ROS     Musculoskeletal   Abdominal   Peds  Hematology  (+) anemia ,   Anesthesia Other Findings 2019 tte:  - Left ventricle: The cavity size was normal. Wall thickness was   increased in a pattern of mild LVH. Systolic function was normal.   The estimated ejection fraction was in the range of 60% to 65%.   Wall motion was normal; there were no regional wall motion   abnormalities. Findings consistent with left ventricular   diastolic dysfunction, grade indeterminate. Doppler parameters   are consistent with high ventricular filling pressure. - Aortic valve: Moderately calcified annulus. Mildly calcified   leaflets. There may be a mild degree of calcific aortic stenosis   but leaflet excursion is difficult to appreciate on short axis   views. - Right ventricle: The cavity size was mildly dilated. Systolic   function was mildly reduced. - Right atrium: The atrium was mildly dilated. - Systemic veins: The IVC is dilated with normal respiratory   variation. Estimated CVP 8 mmHg.   Reproductive/Obstetrics                           Anesthesia Physical Anesthesia Plan  ASA: III  Anesthesia Plan: General   Post-op Pain Management:     Induction: Intravenous  PONV Risk Score and Plan: 3 and Ondansetron and Dexamethasone  Airway Management Planned: Oral ETT  Additional Equipment: None  Intra-op Plan:   Post-operative Plan: Extubation in OR  Informed Consent: I have reviewed the patients History and Physical, chart, labs and discussed the procedure including the risks, benefits and alternatives for the proposed anesthesia with the patient or authorized representative who has indicated his/her understanding and acceptance.   Dental advisory given  Plan Discussed with: CRNA and Surgeon  Anesthesia Plan Comments: (See PAT note 06/02/2018 by Karoline Caldwell, PA-C )       Anesthesia Quick Evaluation

## 2018-06-03 NOTE — Progress Notes (Addendum)
Anesthesia Chart Review:  Case:  [id number] Date/Time:  06/04/18 1222   Procedure:  VIDEO BRONCHOSCOPY (N/A )   Anesthesia type:  General   Pre-op diagnosis:  RUL MASS   Location:  [facility] OR ROOM 11 / [facility]   Surgeon:  Melrose Nakayama, MD      DISCUSSION: 77 yo female current smoker. Pertinent hx includes CVA (2014), Vertigo, Carotid disease s/p Left CEA, hypertension, hyperlipidemia, diabetes, Non small cell lung CA s/p stereotactic radiotherapy, recurrent episodes of transient face and left hand numbness, s/p lap L colectomy.  Pt follows with neurology, Dr. Leonie Man. Per his last note 05/07/18 "recurrent transient episodes of mostly left face and hand paresthesias of unclear etiology.  Possibilities include partial seizures versus atypical migraines.  Recent cognitive impairment and memory difficulties and gait imbalance also need evaluation.  Remote history of left frontal MCA branch infarct in December 2014.  She also has history of lung cancer hence paraneoplastic or meningeal carcinomatosis is also a consideration... I recommend changing Keppra to Depakote ER 500 mg daily due to possible side effects and lack of efficacy."  Poorly controlled DMII, pt reported at PAT appt she does not regularly check BG. Last A1c 9.4 on 04/09/18.  Anticipate she can proceed as planned barring acute status change.  VS: BP (!) 157/64   Pulse 85   Temp 36.8 C (Oral)   Resp (!) 32   Ht 5' (1.524 m)   Wt 61.7 kg   SpO2 98%   BMI 26.58 kg/m   PROVIDERS: [name], MD is PCP  Antony Contras, MD is Neurologist  Curt Bears, MD is Oncologist  LABS: Labs reviewed: Acceptable for surgery. C/w hx of leukocytosis, anemia, and thrombocytosis (all labs ordered are listed, but only abnormal results are displayed)  Labs Reviewed  CBC - Abnormal; Notable for the following components:      Result Value   WBC 16.7 (*)    RBC 3.71 (*)    Hemoglobin 9.7 (*)    HCT 33.8 (*)    MCHC 28.7 (*)    RDW  17.2 (*)    Platelets 849 (*)    All other components within normal limits  COMPREHENSIVE METABOLIC PANEL - Abnormal; Notable for the following components:   Glucose, Bld 140 (*)    Total Protein 6.4 (*)    Albumin 2.6 (*)    Total Bilirubin 0.2 (*)    GFR calc non Af Amer 58 (*)    All other components within normal limits  APTT  PROTIME-INR     IMAGES: CHEST - 2 VIEW 06/02/2018:  COMPARISON:  Chest x-ray of May 04, 2018  FINDINGS: The lungs are mildly hyperinflated with hemidiaphragm flattening. There is persistent right upper lobe soft tissue mass. It has increased slightly in conspicuity. There is a trace of pleural fluid blunting the right costophrenic angles. The left lung is clear. The heart and pulmonary vascularity are normal. There is calcification in the wall of the aortic arch. The trachea is midline. The bony thorax exhibits no acute abnormality.  IMPRESSION: COPD. Persistent right upper lobe mass which is slightly more conspicuous today. Trace right pleural effusion new since the previous study.  Thoracic aortic atherosclerosis.  MRI Brain 06/01/2018: IMPRESSION: Abnormal MRI scan of the brain showing persistent area of encephalomalacia and gliosis in the left frontal region possibly remote age left frontal   infarct with slight or typical features and low-grade glioma is a consideration though less likely.  As well as mild changes of chronic microvascular ischemia and generalized cerebral atrophy.  No acute abnormalities are noted.  Postcontrast images do not result in abnormal areas of enhancement.  Overall no significant change compared with previous MRI dated 04/20/2018  EKG: 05/04/2018: Sinus rhythm. Rate 93. RBBB.  CV: TTE 04/10/2018: Study Conclusions  - Left ventricle: The cavity size was normal. Wall thickness was   increased in a pattern of mild LVH. Systolic function was normal.   The estimated ejection fraction was in the range of 60%  to 65%.   Wall motion was normal; there were no regional wall motion   abnormalities. Findings consistent with left ventricular   diastolic dysfunction, grade indeterminate. Doppler parameters   are consistent with high ventricular filling pressure. - Aortic valve: Moderately calcified annulus. Mildly calcified   leaflets. There may be a mild degree of calcific aortic stenosis   but leaflet excursion is difficult to appreciate on short axis   views. - Right ventricle: The cavity size was mildly dilated. Systolic   function was mildly reduced. - Right atrium: The atrium was mildly dilated. - Systemic veins: The IVC is dilated with normal respiratory   variation. Estimated CVP 8 mmHg.  Past Medical History:  Diagnosis Date  . Cancer ([place])    Lung   . Carotid artery occlusion   . Clostridium difficile infection 06/2013  . Diabetes mellitus    takes Janumet daily  . Dyslipidemia    takes Crestor daily  . Gallstones   . GERD (gastroesophageal reflux disease)   . Heart murmur    hx of  . History of radiation therapy 06/11/17-06/21/17   right lung 50 Gy in 5 fractions  . Hypertension    takes Prinzide and Verapamil daily  . Impaired speech    from stroke  . Neuropathy, diabetic ([place])   . Pneumonia   . PONV (postoperative nausea and vomiting)   . Seizures ([place]) 04/19/2018   recent hospitalization  . Smoker   . Stroke ([place]) 06/25/13  . Vertigo    but doesn't take any meds    Past Surgical History:  Procedure Laterality Date  . cataract removed Right   . CHOLECYSTECTOMY    . COLONOSCOPY    . ENDARTERECTOMY Left 07/14/2013   Procedure: ENDARTERECTOMY CAROTID-LEFT;  Surgeon: Elam Dutch, MD;  Location: [facility];  Service: Vascular;  Laterality: Left;  . EYE SURGERY Left    cataract  . IR PERC PLEURAL DRAIN W/[facility] CATH W/IMG GUIDE  05/23/2017  . KNEE SURGERY Left 87yr ago  . LAPAROSCOPIC PARTIAL COLECTOMY N/A 09/02/2015   Procedure: LAPAROSCOPIC PARTIAL RIGHT  COLECTOMY;  Surgeon: ALeighton Ruff MD;  Location: [facility];  Service: General;  Laterality: N/A;  . PATCH ANGIOPLASTY Left 07/14/2013   Procedure: LEFT CAROTID ARTERY PATCH ANGIOPLASTY;  Surgeon: CElam Dutch MD;  Location: [facility]  Service: Vascular;  Laterality: Left;  . wisdom      MEDICATIONS: . aspirin EC 81 MG tablet  . Blood Glucose Monitoring Suppl (ONE TOUCH ULTRA 2) w/Device KIT  . clopidogrel (PLAVIX) 75 MG tablet  . divalproex (DEPAKOTE ER) 500 MG 24 hr tablet  . glucose blood (ONE TOUCH ULTRA TEST) test strip  . Lancets (ONETOUCH ULTRASOFT) lancets  . magnesium oxide (MAG-OX) 400 (241.3 Mg) MG tablet  . Multiple Vitamins-Minerals (MULTIVITAMIN PO)  . rosuvastatin (CRESTOR) 20 MG tablet  . sitaGLIPtin-metformin (JANUMET) 50-1000 MG tablet  . vitamin B-12 (CYANOCOBALAMIN) 1000 MCG tablet   No  current facility-administered medications for this encounter.     Wynonia Musty [facility] Short Stay Center/Anesthesiology Phone [phone] 06/03/2018 9:59 AM

## 2018-06-03 NOTE — Patient Outreach (Signed)
[facility]) Care Management  06/03/2018  ARYIA DELIRA 02-Nov-1940 [id number]   Successful outreach with [name] Hydrographic surveyor. Member was not available at the time of the call. She reported that [name] was doing well but was very busy with arrangements following the death of her spouse. Stated that she has a scheduled biopsy on tomorrow and multiple MD appointments over the next few weeks. [name] reported no immediate needs but agreed to contact RNCM as needed.  PLAN Will follow up within two weeks.   [place] [phone]

## 2018-06-04 ENCOUNTER — Encounter (HOSPITAL_COMMUNITY): Payer: Self-pay | Admitting: *Deleted

## 2018-06-04 ENCOUNTER — Ambulatory Visit (HOSPITAL_COMMUNITY)
Admission: RE | Admit: 2018-06-04 | Discharge: 2018-06-04 | Disposition: A | Discharge status: Home / Self Care | Payer: Medicare Other | Source: Ambulatory Visit | Attending: Thoracic Surgery (Cardiothoracic Vascular Surgery) | Admitting: Thoracic Surgery (Cardiothoracic Vascular Surgery)

## 2018-06-04 ENCOUNTER — Ambulatory Visit (HOSPITAL_COMMUNITY): Payer: Medicare Other | Admitting: Physician Assistant

## 2018-06-04 ENCOUNTER — Ambulatory Visit (HOSPITAL_COMMUNITY): Payer: Medicare Other | Admitting: Certified Registered Nurse Anesthetist

## 2018-06-04 ENCOUNTER — Encounter (HOSPITAL_COMMUNITY)
Admission: RE | Disposition: A | Discharge status: Home / Self Care | Payer: Self-pay | Source: Ambulatory Visit | Attending: Thoracic Surgery (Cardiothoracic Vascular Surgery)

## 2018-06-04 ENCOUNTER — Other Ambulatory Visit: Payer: Self-pay

## 2018-06-04 DIAGNOSIS — I69321 Dysphasia following cerebral infarction: Secondary | ICD-10-CM | POA: Diagnosis not present

## 2018-06-04 DIAGNOSIS — I10 Essential (primary) hypertension: Secondary | ICD-10-CM | POA: Diagnosis not present

## 2018-06-04 DIAGNOSIS — D649 Anemia, unspecified: Secondary | ICD-10-CM | POA: Diagnosis not present

## 2018-06-04 DIAGNOSIS — Z833 Family history of diabetes mellitus: Secondary | ICD-10-CM | POA: Insufficient documentation

## 2018-06-04 DIAGNOSIS — R918 Other nonspecific abnormal finding of lung field: Secondary | ICD-10-CM

## 2018-06-04 DIAGNOSIS — Z79899 Other long term (current) drug therapy: Secondary | ICD-10-CM | POA: Diagnosis not present

## 2018-06-04 DIAGNOSIS — R42 Dizziness and giddiness: Secondary | ICD-10-CM | POA: Diagnosis not present

## 2018-06-04 DIAGNOSIS — C3431 Malignant neoplasm of lower lobe, right bronchus or lung: Secondary | ICD-10-CM | POA: Diagnosis present

## 2018-06-04 DIAGNOSIS — Z806 Family history of leukemia: Secondary | ICD-10-CM | POA: Insufficient documentation

## 2018-06-04 DIAGNOSIS — R51 Headache: Secondary | ICD-10-CM | POA: Diagnosis not present

## 2018-06-04 DIAGNOSIS — E785 Hyperlipidemia, unspecified: Secondary | ICD-10-CM | POA: Insufficient documentation

## 2018-06-04 DIAGNOSIS — E119 Type 2 diabetes mellitus without complications: Secondary | ICD-10-CM | POA: Insufficient documentation

## 2018-06-04 DIAGNOSIS — Z800 Family history of malignant neoplasm of digestive organs: Secondary | ICD-10-CM | POA: Insufficient documentation

## 2018-06-04 DIAGNOSIS — Z8249 Family history of ischemic heart disease and other diseases of the circulatory system: Secondary | ICD-10-CM | POA: Diagnosis not present

## 2018-06-04 DIAGNOSIS — F17210 Nicotine dependence, cigarettes, uncomplicated: Secondary | ICD-10-CM | POA: Diagnosis not present

## 2018-06-04 DIAGNOSIS — C3411 Malignant neoplasm of upper lobe, right bronchus or lung: Secondary | ICD-10-CM | POA: Insufficient documentation

## 2018-06-04 DIAGNOSIS — Z9861 Coronary angioplasty status: Secondary | ICD-10-CM | POA: Diagnosis not present

## 2018-06-04 DIAGNOSIS — R011 Cardiac murmur, unspecified: Secondary | ICD-10-CM | POA: Insufficient documentation

## 2018-06-04 DIAGNOSIS — R569 Unspecified convulsions: Secondary | ICD-10-CM | POA: Insufficient documentation

## 2018-06-04 DIAGNOSIS — K219 Gastro-esophageal reflux disease without esophagitis: Secondary | ICD-10-CM | POA: Insufficient documentation

## 2018-06-04 DIAGNOSIS — Z9049 Acquired absence of other specified parts of digestive tract: Secondary | ICD-10-CM | POA: Diagnosis not present

## 2018-06-04 DIAGNOSIS — Z7982 Long term (current) use of aspirin: Secondary | ICD-10-CM | POA: Insufficient documentation

## 2018-06-04 DIAGNOSIS — Z923 Personal history of irradiation: Secondary | ICD-10-CM | POA: Insufficient documentation

## 2018-06-04 DIAGNOSIS — E876 Hypokalemia: Secondary | ICD-10-CM | POA: Diagnosis not present

## 2018-06-04 HISTORY — PX: VIDEO BRONCHOSCOPY: SHX5072

## 2018-06-04 LAB — GLUCOSE, CAPILLARY
GLUCOSE-CAPILLARY: 156 mg/dL — AB (ref 70–99)
Glucose-Capillary: 171 mg/dL — ABNORMAL HIGH (ref 70–99)

## 2018-06-04 SURGERY — BRONCHOSCOPY, VIDEO-ASSISTED
Anesthesia: General | Site: Chest

## 2018-06-04 MED ORDER — ACETAMINOPHEN 160 MG/5ML PO SOLN: 1000.0000 mg | Freq: Once | ORAL | Status: DC | PRN

## 2018-06-04 MED ORDER — PROPOFOL 500 MG/50ML IV EMUL
INTRAVENOUS | Status: DC | PRN
  Administered 2018-06-04: 75 ug/kg/min via INTRAVENOUS

## 2018-06-04 MED ORDER — 0.9 % SODIUM CHLORIDE (POUR BTL) OPTIME
TOPICAL | Status: DC | PRN
  Administered 2018-06-04: 1000 mL

## 2018-06-04 MED ORDER — FENTANYL CITRATE (PF) 250 MCG/5ML IJ SOLN
INTRAMUSCULAR | Status: DC | PRN
  Administered 2018-06-04: 50 ug via INTRAVENOUS
  Administered 2018-06-04: 100 ug via INTRAVENOUS

## 2018-06-04 MED ORDER — LACTATED RINGERS IV SOLN
INTRAVENOUS | Status: DC
  Administered 2018-06-04: 11:00:00 via INTRAVENOUS

## 2018-06-04 MED ORDER — SUGAMMADEX SODIUM 200 MG/2ML IV SOLN
INTRAVENOUS | Status: DC | PRN
  Administered 2018-06-04: 200 mg via INTRAVENOUS

## 2018-06-04 MED ORDER — PROPOFOL 1000 MG/100ML IV EMUL
INTRAVENOUS | Status: AC
  Filled 2018-06-04: qty 100

## 2018-06-04 MED ORDER — LIDOCAINE 2% (20 MG/ML) 5 ML SYRINGE
INTRAMUSCULAR | Status: DC | PRN
  Administered 2018-06-04: 40 mg via INTRAVENOUS

## 2018-06-04 MED ORDER — FENTANYL CITRATE (PF) 100 MCG/2ML IJ SOLN: 25.0000 ug | INTRAMUSCULAR | Status: DC | PRN

## 2018-06-04 MED ORDER — EPINEPHRINE PF 1 MG/ML IJ SOLN
INTRAMUSCULAR | Status: DC | PRN
  Administered 2018-06-04: 1 mg

## 2018-06-04 MED ORDER — ROCURONIUM BROMIDE 10 MG/ML (PF) SYRINGE
PREFILLED_SYRINGE | INTRAVENOUS | Status: DC | PRN
  Administered 2018-06-04: 20 mg via INTRAVENOUS
  Administered 2018-06-04: 30 mg via INTRAVENOUS

## 2018-06-04 MED ORDER — SUGAMMADEX SODIUM 200 MG/2ML IV SOLN
INTRAVENOUS | Status: AC
  Filled 2018-06-04: qty 2

## 2018-06-04 MED ORDER — ONDANSETRON HCL 4 MG/2ML IJ SOLN
INTRAMUSCULAR | Status: AC
  Filled 2018-06-04: qty 2

## 2018-06-04 MED ORDER — ACETAMINOPHEN 10 MG/ML IV SOLN: 1000.0000 mg | Freq: Once | INTRAVENOUS | Status: DC | PRN

## 2018-06-04 MED ORDER — LIDOCAINE 2% (20 MG/ML) 5 ML SYRINGE
INTRAMUSCULAR | Status: AC
  Filled 2018-06-04: qty 5

## 2018-06-04 MED ORDER — OXYCODONE HCL 5 MG PO TABS: 5.0000 mg | ORAL_TABLET | Freq: Once | ORAL | Status: DC | PRN

## 2018-06-04 MED ORDER — ONDANSETRON HCL 4 MG/2ML IJ SOLN
INTRAMUSCULAR | Status: DC | PRN
  Administered 2018-06-04: 4 mg via INTRAVENOUS

## 2018-06-04 MED ORDER — ACETAMINOPHEN 500 MG PO TABS: 1000.0000 mg | ORAL_TABLET | Freq: Once | ORAL | Status: DC | PRN

## 2018-06-04 MED ORDER — FENTANYL CITRATE (PF) 250 MCG/5ML IJ SOLN
INTRAMUSCULAR | Status: AC
  Filled 2018-06-04: qty 5

## 2018-06-04 MED ORDER — PROPOFOL 10 MG/ML IV BOLUS
INTRAVENOUS | Status: DC | PRN
  Administered 2018-06-04: 70 mg via INTRAVENOUS

## 2018-06-04 MED ORDER — LACTATED RINGERS IV SOLN
INTRAVENOUS | Status: DC | PRN
  Administered 2018-06-04: 12:00:00 via INTRAVENOUS

## 2018-06-04 MED ORDER — OXYCODONE HCL 5 MG/5ML PO SOLN: 5.0000 mg | Freq: Once | ORAL | Status: DC | PRN

## 2018-06-04 MED ORDER — EPHEDRINE SULFATE-NACL 50-0.9 MG/10ML-% IV SOSY
PREFILLED_SYRINGE | INTRAVENOUS | Status: DC | PRN
  Administered 2018-06-04: 5 mg via INTRAVENOUS

## 2018-06-04 MED ORDER — PHENYLEPHRINE 40 MCG/ML (10ML) SYRINGE FOR IV PUSH (FOR BLOOD PRESSURE SUPPORT)
PREFILLED_SYRINGE | INTRAVENOUS | Status: DC | PRN
  Administered 2018-06-04: 40 ug via INTRAVENOUS
  Administered 2018-06-04: 60 ug via INTRAVENOUS

## 2018-06-04 MED ORDER — DEXAMETHASONE SODIUM PHOSPHATE 10 MG/ML IJ SOLN
INTRAMUSCULAR | Status: DC | PRN
  Administered 2018-06-04: 10 mg via INTRAVENOUS

## 2018-06-04 MED ORDER — ROCURONIUM BROMIDE 50 MG/5ML IV SOSY
PREFILLED_SYRINGE | INTRAVENOUS | Status: AC
  Filled 2018-06-04: qty 10

## 2018-06-04 MED ORDER — PROPOFOL 10 MG/ML IV BOLUS
INTRAVENOUS | Status: AC
  Filled 2018-06-04: qty 20

## 2018-06-04 MED ORDER — DEXAMETHASONE SODIUM PHOSPHATE 10 MG/ML IJ SOLN
INTRAMUSCULAR | Status: AC
  Filled 2018-06-04: qty 1

## 2018-06-04 SURGICAL SUPPLY — 35 items
ADAPTER VALVE BIOPSY EBUS (MISCELLANEOUS) IMPLANT
ADPTR VALVE BIOPSY EBUS (MISCELLANEOUS)
BRUSH CYTOL CELLEBRITY 1.5X140 (MISCELLANEOUS) ×2 IMPLANT
CANISTER SUCT 3000ML PPV (MISCELLANEOUS) ×3 IMPLANT
CONT SPEC 4OZ CLIKSEAL STRL BL (MISCELLANEOUS) ×6 IMPLANT
COVER BACK TABLE 60X90IN (DRAPES) ×3 IMPLANT
COVER WAND RF STERILE (DRAPES) ×1 IMPLANT
FILTER STRAW FLUID ASPIR (MISCELLANEOUS) ×2 IMPLANT
FORCEPS BIOP RJ4 1.8 (CUTTING FORCEPS) ×3 IMPLANT
FORCEPS RADIAL JAW LRG 4 PULM (INSTRUMENTS) ×1 IMPLANT
GAUZE SPONGE 4X4 12PLY STRL (GAUZE/BANDAGES/DRESSINGS) ×3 IMPLANT
GLOVE BIO SURGEON STRL SZ 6 (GLOVE) ×2 IMPLANT
GLOVE SURG SIGNA 7.5 PF LTX (GLOVE) ×5 IMPLANT
GOWN STRL REUS W/ TWL LRG LVL3 (GOWN DISPOSABLE) ×1 IMPLANT
GOWN STRL REUS W/ TWL XL LVL3 (GOWN DISPOSABLE) ×1 IMPLANT
GOWN STRL REUS W/TWL LRG LVL3 (GOWN DISPOSABLE) ×3
GOWN STRL REUS W/TWL XL LVL3 (GOWN DISPOSABLE) ×6
KIT CLEAN ENDO COMPLIANCE (KITS) ×3 IMPLANT
KIT TURNOVER KIT B (KITS) ×3 IMPLANT
MARKER SKIN DUAL TIP RULER LAB (MISCELLANEOUS) ×3 IMPLANT
NS IRRIG 1000ML POUR BTL (IV SOLUTION) ×3 IMPLANT
OIL SILICONE PENTAX (PARTS (SERVICE/REPAIRS)) ×3 IMPLANT
PAD ARMBOARD 7.5X6 YLW CONV (MISCELLANEOUS) ×6 IMPLANT
RADIAL JAW LRG 4 PULMONARY (INSTRUMENTS) ×2
SYR 20ML ECCENTRIC (SYRINGE) ×6 IMPLANT
SYR 5ML LL (SYRINGE) ×3 IMPLANT
SYR 5ML LUER SLIP (SYRINGE) ×3 IMPLANT
TOWEL GREEN STERILE (TOWEL DISPOSABLE) ×3 IMPLANT
TOWEL GREEN STERILE FF (TOWEL DISPOSABLE) ×3 IMPLANT
TRAP SPECIMEN MUCOUS 40CC (MISCELLANEOUS) ×3 IMPLANT
TUBE CONNECTING 20'X1/4 (TUBING) ×1
TUBE CONNECTING 20X1/4 (TUBING) ×2 IMPLANT
VALVE BIOPSY  SINGLE USE (MISCELLANEOUS) ×2
VALVE BIOPSY SINGLE USE (MISCELLANEOUS) ×1 IMPLANT
VALVE SUCTION BRONCHIO DISP (MISCELLANEOUS) ×3 IMPLANT

## 2018-06-04 NOTE — Discharge Instructions (Addendum)
Do not drive or engage in heavy physical activity for 24 hours  You may cough up small amounts of blood over the next few days  You may use acetaminophen (Tylenol) if needed for discomfort  You may resume clopidogrel (Plavix) on Friday 06/06/2018  Call [phone] if you develop chest pain, shortness of breath, fever > 101 F or cough up more than 2 tablespoons of blood  Follow up with Dr. Julien Nordmann as scheduled

## 2018-06-04 NOTE — Anesthesia Procedure Notes (Signed)
Procedure Name: Intubation Date/Time: 06/04/2018 12:20 PM Performed by: Teressa Lower., CRNA Pre-anesthesia Checklist: Patient identified, Emergency Drugs available, Suction available and Patient being monitored Patient Re-evaluated:Patient Re-evaluated prior to induction Oxygen Delivery Method: Circle system utilized Preoxygenation: Pre-oxygenation with 100% oxygen Induction Type: IV induction Ventilation: Mask ventilation without difficulty Laryngoscope Size: Mac and 3 Grade View: Grade I Tube type: Oral Tube size: 7.0 mm Number of attempts: 1 Airway Equipment and Method: Stylet and Oral airway Placement Confirmation: ETT inserted through vocal cords under direct vision,  positive ETCO2 and breath sounds checked- equal and bilateral Secured at: 23 cm Tube secured with: Tape Dental Injury: Teeth and Oropharynx as per pre-operative assessment

## 2018-06-04 NOTE — Transfer of Care (Signed)
Immediate Anesthesia Transfer of Care Note  Patient: [name]  Procedure(s) Performed: VIDEO BRONCHOSCOPY (N/A Chest)  Patient Location: PACU  Anesthesia Type:General  Level of Consciousness: awake, alert  and oriented  Airway & Oxygen Therapy: Patient Spontanous Breathing and Patient connected to face mask oxygen  Post-op Assessment: Report given to RN and Post -op Vital signs reviewed and stable  Post vital signs: Reviewed and stable  Last Vitals:  Vitals Value Taken Time  BP 133/62 06/04/2018  1:50 PM  Temp    Pulse 92 06/04/2018  1:53 PM  Resp 20 06/04/2018  1:53 PM  SpO2 94 % 06/04/2018  1:53 PM  Vitals shown include unvalidated device data.  Last Pain:  Vitals:   06/04/18 1049  TempSrc:   PainSc: 0-No pain         Complications: No apparent anesthesia complications

## 2018-06-04 NOTE — Interval H&P Note (Signed)
History and Physical Interval Note:  06/04/2018 11:39 AM  [name]  has presented today for surgery, with the diagnosis of RUL MASS  The various methods of treatment have been discussed with the patient and family. After consideration of risks, benefits and other options for treatment, the patient has consented to  Procedure(s): VIDEO BRONCHOSCOPY (N/A) as a surgical intervention .  The patient's history has been reviewed, patient examined, no change in status, stable for surgery.  I have reviewed the patient's chart and labs.  Questions were answered to the patient's satisfaction.     Melrose Nakayama

## 2018-06-04 NOTE — Brief Op Note (Signed)
06/04/2018  1:18 PM  PATIENT:  [name]  77 y.o. female  PRE-OPERATIVE DIAGNOSIS:  RUL MASS  POST-OPERATIVE DIAGNOSIS:  RUL MASS  PROCEDURE:  Procedure(s): VIDEO BRONCHOSCOPY (N/A) with brushings and endobronchial biopsies  SURGEON:  Surgeon(s) and Role:    * Melrose Nakayama, MD - Primary  PHYSICIAN ASSISTANT:   ASSISTANTS: none   ANESTHESIA:   general  EBL:  minimal  BLOOD ADMINISTERED:none  DRAINS: none   LOCAL MEDICATIONS USED:  NONE  SPECIMEN:  Source of Specimen:  RUL  DISPOSITION OF SPECIMEN:  PATHOLOGY  COUNTS:  NO endoscopic  TOURNIQUET:  * No tourniquets in log *  DICTATION: .Other Dictation: Dictation Number -  PLAN OF CARE: Discharge to home after PACU  PATIENT DISPOSITION:  PACU - hemodynamically stable.   Delay start of Pharmacological VTE agent (>24hrs) due to surgical blood loss or risk of bleeding: not applicable

## 2018-06-05 ENCOUNTER — Encounter: Payer: Self-pay | Admitting: *Deleted

## 2018-06-05 ENCOUNTER — Encounter: Payer: Self-pay | Admitting: Family Medicine

## 2018-06-05 ENCOUNTER — Ambulatory Visit (INDEPENDENT_AMBULATORY_CARE_PROVIDER_SITE_OTHER): Payer: Medicare Other | Admitting: Family Medicine

## 2018-06-05 VITALS — BP 140/82 | HR 69 | Temp 97.9°F | Wt 135.2 lb

## 2018-06-05 DIAGNOSIS — E538 Deficiency of other specified B group vitamins: Secondary | ICD-10-CM | POA: Diagnosis not present

## 2018-06-05 DIAGNOSIS — C3490 Malignant neoplasm of unspecified part of unspecified bronchus or lung: Secondary | ICD-10-CM

## 2018-06-05 DIAGNOSIS — E1159 Type 2 diabetes mellitus with other circulatory complications: Secondary | ICD-10-CM | POA: Diagnosis not present

## 2018-06-05 DIAGNOSIS — E1169 Type 2 diabetes mellitus with other specified complication: Secondary | ICD-10-CM

## 2018-06-05 DIAGNOSIS — E785 Hyperlipidemia, unspecified: Secondary | ICD-10-CM

## 2018-06-05 DIAGNOSIS — I152 Hypertension secondary to endocrine disorders: Secondary | ICD-10-CM

## 2018-06-05 DIAGNOSIS — I10 Essential (primary) hypertension: Secondary | ICD-10-CM

## 2018-06-05 LAB — POCT GLYCOSYLATED HEMOGLOBIN (HGB A1C): Hemoglobin A1C: 7.7 % — AB (ref 4.0–5.6)

## 2018-06-05 MED ORDER — LISINOPRIL 10 MG PO TABS: 10.0000 mg | ORAL_TABLET | Freq: Every day | ORAL | 3 refills | Status: DC

## 2018-06-05 MED ORDER — CYANOCOBALAMIN 1000 MCG/ML IJ SOLN
1000.0000 ug | Freq: Once | INTRAMUSCULAR | Status: AC
  Administered 2018-06-05: 1000 ug via INTRAMUSCULAR

## 2018-06-05 NOTE — Op Note (Signed)
NAME: ARON, NEEDLES MEDICAL RECORD [id number] ACCOUNT [id number] DATE OF BIRTH:03/24/1941 FACILITY: [facility] LOCATION: [facility] PHYSICIAN:Torunn Chancellor Chaya Jan, MD  OPERATIVE REPORT  DATE OF PROCEDURE:  06/04/2018  PREOPERATIVE DIAGNOSIS:  Squamous cell carcinoma, right upper lobe with increase in size.  POSTOPERATIVE DIAGNOSIS:  Squamous cell carcinoma, right upper lobe with increase in size.  PROCEDURE:  Video bronchoscopy with brushings and endobronchial biopsies.  SURGEON:  Modesto Charon, MD  ASSISTANT:  None.  ANESTHESIA:  General.  FINDINGS:  Brushings showed diagnostic material.  Biopsies taken from those areas. Significant bleeding with biopsies which limited the amount of biopsies that could be done.  Specimen appeared adequate.  No ongoing bleeding at completion.  CLINICAL NOTE:  The patient is a 77 year old woman with a history of tobacco abuse who was diagnosed with squamous cell carcinoma of the right upper lobe back in 2018.  She declined surgery and opted for radiation.  Recent followup showed an increase in  size of the right upper lobe mass.  She was referred for bronchoscopy for additional biopsies with a plan for PD-L1 testing.  The indications, risks, benefits, and alternatives were discussed in detail with the patient.  She understood and accepted the  risks and agreed to proceed.  OPERATIVE NOTE:  The patient was brought to the operating room on 06/04/2018.  She had induction of general anesthesia and was intubated.  Flexible fiberoptic bronchoscopy was performed via the endotracheal tube.  There was normal endobronchial anatomy  with no endobronchial lesions to the level of the subsegmental bronchi.  The bronchoscope was directed to the right upper lobe origin.  There were 2 segmental bronchi.  Brushings were obtained from each of these and the more superior brushings were diagnostic.  The biopsies were concentrated from this segment.  The biopsy   forceps were used to take multiple endobronchial biopsies.  There was bleeding with biopsies and dilute epinephrine was applied topically to assist with hemostasis.  Multiple biopsies were obtained. The scope was withdrawn.  After 5  minutes, the scope was replaced.  There was no ongoing bleeding.  The patient then was extubated in the operating room and taken to the [facility] in good condition.  TN/NUANCE  D:06/04/2018 T:06/05/2018 JOB:[id number]

## 2018-06-05 NOTE — Progress Notes (Signed)
Subjective:    Patient ID: [name], female    DOB: 1940-10-07, 77 y.o.   MRN: [id number]  [name] is a 77 y.o. female who presents for follow-up of Type 2 diabetes mellitus.  She also continues to get her B12 injections.  She does state that she does have a little more energy.  She has not been on lisinopril.  This was stopped while she was in the hospital.  She continues on Crestor as well as Janumet.  She is having no difficulty with doing these medications but does need help with her medication since she is in the donut hole.  She is here with her grandson. She recently had a lymph node biopsy to help stage her lung cancer. Patient is  checking home blood sugars.   Home blood sugar records: meter record How often is blood sugars being checked:BID fasting  And 2 hr post meal 173-230 avg. Current symptoms/problems include none and have been unchanged. Daily foot checks: yes   Any foot concerns: swelling Last eye exam: unkown Exercise: walking in the house  The following portions of the patient's history were reviewed and updated as appropriate: allergies, current medications, past medical history, past social history and problem list.  ROS as in subjective above.     Objective:    Physical Exam Alert and in no distress otherwise not examined.   Lab Review Diabetic Labs Latest Ref Rng & Units 06/02/2018 05/07/2018 05/04/2018 04/21/2018 04/19/2018  HbA1c 4.8 - 5.6 % - - - - -  Microalbumin mg/L - - - - -  Micro/Creat Ratio - - - - - -  Chol 0 - 200 mg/dL - - - - -  HDL >40 mg/dL - - - - -  Calc LDL 0 - 99 mg/dL - - - - -  Triglycerides <150 mg/dL - - - - -  Creatinine 0.44 - 1.00 mg/dL 0.93 0.95 0.93 0.70 0.84   BP/Weight 06/04/2018 06/02/2018 05/22/2018 05/21/2018 18/29/9371  Systolic BP 696 789 381 017 510  Diastolic BP 62 64 77 76 72  Wt. (Lbs) 136.02 136.1 132.8 134.4 131.1  BMI 26.57 26.58 21.11 21.37 20.84   Foot/eye exam completion dates Latest Ref Rng &  Units 03/08/2015 04/15/2014  Eye Exam No Retinopathy - No Retinopathy  Foot Form Completion - Done -  A1c is 7.7 [name]  reports that she has been smoking cigarettes. She has a 30.00 pack-year smoking history. She has never used smokeless tobacco. She reports that she does not drink alcohol or use drugs.     Assessment & Plan:    DM type 2 with diabetic dyslipidemia ([place]) - Plan: POCT glycosylated hemoglobin (Hb A1C)  Vitamin B12 deficiency - Plan: cyanocobalamin ((VITAMIN B-12)) injection 1,000 mcg  Malignant neoplasm of lung, unspecified laterality, unspecified part of lung ([place])  Hypertension associated with diabetes ([place]) - Plan: lisinopril (PRINIVIL,ZESTRIL) 10 MG tablet  Hyperlipidemia associated with type 2 diabetes mellitus ([place])   1. Rx changes: She does have a bottle of lisinopril 20/12.5.  I will have him throw that away and I will call him the 10 mg lisinopril.  Encouraged her to be as active as possible.  She will also take oral B12. 2. Education: Reviewed 'ABCs' of diabetes management (respective goals in parentheses):  A1C (<7), blood pressure (<130/80), and cholesterol (LDL <100). 3. Compliance at present is estimated to be good. Efforts to improve compliance (if necessary) will be directed at increased exercise. 4. Follow up:  4 months

## 2018-06-05 NOTE — Anesthesia Postprocedure Evaluation (Signed)
Anesthesia Post Note  Patient: [name]  Procedure(s) Performed: VIDEO BRONCHOSCOPY (N/A Chest)     Patient location during evaluation: PACU Anesthesia Type: General Level of consciousness: awake and alert Pain management: pain level controlled Vital Signs Assessment: post-procedure vital signs reviewed and stable Respiratory status: spontaneous breathing, nonlabored ventilation, respiratory function stable and patient connected to nasal cannula oxygen Cardiovascular status: blood pressure returned to baseline and stable Postop Assessment: no apparent nausea or vomiting Anesthetic complications: no    Last Vitals:  Vitals:   06/04/18 1444 06/04/18 1515  BP: (!) 128/56 134/62  Pulse: 88 88  Resp: 18   Temp: (!) 36.4 C   SpO2: 98% 99%    Last Pain:  Vitals:   06/04/18 1049  TempSrc:   PainSc: 0-No pain                 Corrinne Benegas

## 2018-06-05 NOTE — Progress Notes (Signed)
Oncology Nurse Navigator Documentation  Oncology Nurse Navigator Flowsheets 06/05/2018  Navigator Location [facility]-  Navigator Encounter Type Other/per Dr. Julien Nordmann I updated pathology on his request for PDL 1 testing on tissue sample obtained on 06/04/18 [id number].   Treatment Phase Pre-Tx/Tx Discussion  Barriers/Navigation Needs Coordination of Care  Interventions Coordination of Care  Coordination of Care Other  Acuity Level 2  Time Spent with Patient 30

## 2018-06-09 ENCOUNTER — Inpatient Hospital Stay: Payer: Medicare Other | Attending: Internal Medicine

## 2018-06-09 ENCOUNTER — Encounter: Payer: Self-pay | Admitting: *Deleted

## 2018-06-09 ENCOUNTER — Encounter: Payer: Self-pay | Admitting: Oncology

## 2018-06-09 ENCOUNTER — Other Ambulatory Visit: Payer: Self-pay | Admitting: Internal Medicine

## 2018-06-09 ENCOUNTER — Inpatient Hospital Stay (HOSPITAL_BASED_OUTPATIENT_CLINIC_OR_DEPARTMENT_OTHER): Payer: Medicare Other | Admitting: Oncology

## 2018-06-09 ENCOUNTER — Telehealth: Payer: Self-pay | Admitting: Neurology

## 2018-06-09 ENCOUNTER — Telehealth: Payer: Self-pay | Admitting: Oncology

## 2018-06-09 VITALS — BP 156/72 | HR 81 | Temp 98.6°F | Resp 16 | Ht 60.0 in | Wt 138.1 lb

## 2018-06-09 DIAGNOSIS — E1140 Type 2 diabetes mellitus with diabetic neuropathy, unspecified: Secondary | ICD-10-CM | POA: Diagnosis not present

## 2018-06-09 DIAGNOSIS — Z79899 Other long term (current) drug therapy: Secondary | ICD-10-CM

## 2018-06-09 DIAGNOSIS — Z7984 Long term (current) use of oral hypoglycemic drugs: Secondary | ICD-10-CM

## 2018-06-09 DIAGNOSIS — I69928 Other speech and language deficits following unspecified cerebrovascular disease: Secondary | ICD-10-CM

## 2018-06-09 DIAGNOSIS — E785 Hyperlipidemia, unspecified: Secondary | ICD-10-CM | POA: Insufficient documentation

## 2018-06-09 DIAGNOSIS — Z9221 Personal history of antineoplastic chemotherapy: Secondary | ICD-10-CM | POA: Insufficient documentation

## 2018-06-09 DIAGNOSIS — I10 Essential (primary) hypertension: Secondary | ICD-10-CM | POA: Insufficient documentation

## 2018-06-09 DIAGNOSIS — Z5111 Encounter for antineoplastic chemotherapy: Secondary | ICD-10-CM

## 2018-06-09 DIAGNOSIS — Z7982 Long term (current) use of aspirin: Secondary | ICD-10-CM | POA: Diagnosis not present

## 2018-06-09 DIAGNOSIS — Z5112 Encounter for antineoplastic immunotherapy: Secondary | ICD-10-CM

## 2018-06-09 DIAGNOSIS — K219 Gastro-esophageal reflux disease without esophagitis: Secondary | ICD-10-CM | POA: Diagnosis not present

## 2018-06-09 DIAGNOSIS — Z923 Personal history of irradiation: Secondary | ICD-10-CM

## 2018-06-09 DIAGNOSIS — C3491 Malignant neoplasm of unspecified part of right bronchus or lung: Secondary | ICD-10-CM | POA: Insufficient documentation

## 2018-06-09 DIAGNOSIS — R5383 Other fatigue: Secondary | ICD-10-CM | POA: Insufficient documentation

## 2018-06-09 DIAGNOSIS — C3411 Malignant neoplasm of upper lobe, right bronchus or lung: Secondary | ICD-10-CM | POA: Insufficient documentation

## 2018-06-09 DIAGNOSIS — Z7189 Other specified counseling: Secondary | ICD-10-CM

## 2018-06-09 LAB — CBC WITH DIFFERENTIAL (CANCER CENTER ONLY)
Abs Immature Granulocytes: 0.08 10*3/uL — ABNORMAL HIGH (ref 0.00–0.07)
BASOS PCT: 0 %
Basophils Absolute: 0.1 10*3/uL (ref 0.0–0.1)
EOS ABS: 1.2 10*3/uL — AB (ref 0.0–0.5)
EOS PCT: 7 %
HCT: 30.6 % — ABNORMAL LOW (ref 36.0–46.0)
Hemoglobin: 9.5 g/dL — ABNORMAL LOW (ref 12.0–15.0)
IMMATURE GRANULOCYTES: 0 %
Lymphocytes Relative: 10 %
Lymphs Abs: 1.9 10*3/uL (ref 0.7–4.0)
MCH: 26.9 pg (ref 26.0–34.0)
MCHC: 31 g/dL (ref 30.0–36.0)
MCV: 86.7 fL (ref 80.0–100.0)
Monocytes Absolute: 1 10*3/uL (ref 0.1–1.0)
Monocytes Relative: 6 %
NEUTROS PCT: 77 %
Neutro Abs: 14.4 10*3/uL — ABNORMAL HIGH (ref 1.7–7.7)
PLATELETS: 671 10*3/uL — AB (ref 150–400)
RBC: 3.53 MIL/uL — ABNORMAL LOW (ref 3.87–5.11)
RDW: 17.2 % — AB (ref 11.5–15.5)
WBC Count: 18.7 10*3/uL — ABNORMAL HIGH (ref 4.0–10.5)
nRBC: 0 % (ref 0.0–0.2)

## 2018-06-09 LAB — CMP (CANCER CENTER ONLY)
ALBUMIN: 2.6 g/dL — AB (ref 3.5–5.0)
ALK PHOS: 72 U/L (ref 38–126)
ALT: 7 U/L (ref 0–44)
ANION GAP: 9 (ref 5–15)
AST: 14 U/L — ABNORMAL LOW (ref 15–41)
BILIRUBIN TOTAL: 0.4 mg/dL (ref 0.3–1.2)
BUN: 11 mg/dL (ref 8–23)
CALCIUM: 9.2 mg/dL (ref 8.9–10.3)
CO2: 29 mmol/L (ref 22–32)
CREATININE: 0.83 mg/dL (ref 0.44–1.00)
Chloride: 105 mmol/L (ref 98–111)
GFR, Estimated: >60 mL/min (ref >60)
GLUCOSE: 181 mg/dL — AB (ref 70–99)
Potassium: 3.8 mmol/L (ref 3.5–5.1)
Sodium: 143 mmol/L (ref 135–145)
TOTAL PROTEIN: 6.2 g/dL — AB (ref 6.5–8.1)

## 2018-06-09 MED ORDER — PROCHLORPERAZINE MALEATE 10 MG PO TABS: 10.0000 mg | ORAL_TABLET | Freq: Four times a day (QID) | ORAL | 0 refills | Status: DC | PRN

## 2018-06-09 NOTE — Progress Notes (Signed)
Open by error

## 2018-06-09 NOTE — Progress Notes (Signed)
START ON PATHWAY REGIMEN - Non-Small Cell Lung     Administer weekly:     Paclitaxel      Carboplatin   **Always confirm dose/schedule in your pharmacy ordering system**  Patient Characteristics: Stage III - Unresectable, PS = 0, 1 AJCC T Category: T4 Current Disease Status: No Distant Mets or Local Recurrence AJCC N Category: N0 AJCC M Category: M0 AJCC 8 Stage Grouping: IIIA Performance Status: PS = 0, 1 Intent of Therapy: Curative Intent, Discussed with Patient

## 2018-06-09 NOTE — Assessment & Plan Note (Addendum)
This is a very pleasant 77 year old white female with recurrent non-small cell lung cancer likely squamous cell carcinoma that was initially diagnosed as a stage IIA (T2b, N0, M0) in August 2018 status post curative stereotactic radiotherapy completed June 21, 2017.  The patient presented with concerning findings for disease recurrence and progression with enlarging and hypermetabolic right upper lobe lung mass presenting as stage IIIa (T3, N0, M0).  She had a recent repeat biopsy  of the right upper lobe lung lesion which was consistent with non-small cell lung cancer, squamous cell.  PDL 1 is pending.  The patient was seen with Dr. Julien Nordmann.  Had a lengthy discussion with the patient and her daughter today regarding her current disease stage, prognosis, and treatment options.  Treatment options that were discussed with the patient and her daughter including a course of current chemoradiation with chemotherapy consisting of carboplatin for an AUC of 2 and paclitaxel 45 mg/m2 versus a referral to palliative care.  The patient is interested in pursuing treatment.  Adverse effects of this treatment were discussed including but not limited to myelosuppression, alopecia, nausea and vomiting, peripheral neuropathy, renal insufficiency.  We have referred the patient back to Dr. Sondra Come for discussion of radiation. She will have a chemotherapy education class.  Anticipate start date of 06/23/2018. The patient will follow-up in approximately 3 weeks for evaluation prior to cycle #2 of chemotherapy.  The patient was advised to call immediately if she has any concerning symptoms in the interval.  The patient voices understanding of current disease status and treatment options and is in agreement with the current care plan.  All questions were answered. The patient knows to call the clinic with any problems, questions or concerns. We can certainly see the patient much sooner if necessary.

## 2018-06-09 NOTE — Research (Signed)
   Procurement of Human Biospecimens for the Discovery and  Validation of Biomarkers for the Prediction, Diagnosis and Management of Disease   06/09/18 Per NP, patient does not have time to discuss the study today, will follow up on next scheduled visit. Farris Has, Research Assistant

## 2018-06-09 NOTE — Telephone Encounter (Signed)
Printed calendar and avs. °

## 2018-06-09 NOTE — Telephone Encounter (Signed)
Please call [phone] select opt. # 4 . You will need the reference # [id number] this has to be done before 06/11/2018. PET Scan  Cpt code [phone] .   Janett Billow if you get get this approved I will need approval number.

## 2018-06-09 NOTE — Progress Notes (Signed)
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  The patient presented today with concerning findings for disease recurrence and progression with enlarging and hypermetabolic right upper lobe Walker mass presenting as stage IIIa (T3, N0, M0).   PRIOR THERAPY: Curative stereotactic radiotherapy completed on June 21, 2017.  CURRENT THERAPY: A course of concurrent chemoradiation with chemotherapy consisting of carboplatin for an AUC of 2 and paclitaxel 45 mg/m.  First dose expected on 06/23/2018.  INTERVAL HISTORY: [name] 77 y.o. female returns for routine follow-up visit accompanied by her daughter.  The patient is feeling fine today and has no specific complaints except for mild fatigue.  She denies fevers and chills.  Denies chest pain, shortness of breath, cough, hemoptysis.  Denies nausea, vomiting, constipation, diarrhea.  Denies recent weight loss or night sweats.  The patient had a recent biopsy and is here to discuss the results and treatment options.  MEDICAL HISTORY: Past Medical History:  Diagnosis Date  . Cancer ([place])    Walker   . Carotid artery occlusion   . Clostridium difficile infection 06/2013  . Diabetes mellitus    takes Janumet daily  . Dyslipidemia    takes Crestor daily  . Gallstones   . GERD (gastroesophageal reflux disease)   . Heart murmur    hx of  . History of radiation therapy 06/11/17-06/21/17   right Walker 50 Gy in 5 fractions  . Hypertension    takes Prinzide and Verapamil daily  . Impaired speech    from stroke  . Neuropathy, diabetic ([place])   . Pneumonia   . PONV (postoperative nausea and vomiting)   . Seizures ([place]) 04/19/2018   recent hospitalization   . Smoker   . Stroke ([place]) 06/25/13  . Vertigo    but doesn't take any meds    ALLERGIES:  is allergic to codeine; lipitor [atorvastatin]; and phenergan [promethazine hcl].  MEDICATIONS:  Current Outpatient Medications  Medication Sig Dispense Refill  . aspirin EC 81 MG tablet Take 81 mg by mouth daily.    . Blood Glucose Monitoring Suppl (ONE TOUCH ULTRA 2) w/Device KIT 1 Device by Does not apply route 2 (two) times daily. 1 each 0  . clopidogrel (PLAVIX) 75 MG tablet TAKE 1 TABLET BY MOUTH  DAILY (Patient taking differently: Take 75 mg by mouth daily. ) 90 tablet 3  . divalproex (DEPAKOTE ER) 500 MG 24 hr tablet Take 1 tablet (500 mg total) by mouth daily. 30 tablet 3  . glucose blood (ONE TOUCH ULTRA TEST) test strip Use as instructed 100 each 12  . Lancets (ONETOUCH ULTRASOFT) lancets Use as instructed 100 each 12  . lisinopril (PRINIVIL,ZESTRIL) 10 MG tablet Take 1 tablet (10 mg total) by mouth daily. 90 tablet 3  . Multiple Vitamins-Minerals (MULTIVITAMIN PO) Take 1 tablet by mouth daily.    . rosuvastatin (CRESTOR) 20 MG tablet TAKE 1 TABLET BY MOUTH ONCE DAILY (Patient taking differently: Take 20 mg by mouth daily. ) 180 tablet 0  . sitaGLIPtin-metformin (JANUMET) 50-1000 MG tablet Take 1 tablet by mouth 2 (two) times daily. (Patient taking differently: Take 1 tablet by mouth 2 (two) times daily before a meal. ) 120 tablet 0  . vitamin B-12 (CYANOCOBALAMIN) 1000 MCG  tablet Take 1,000 mcg by mouth daily.    Marland Kitchen lisinopril-hydrochlorothiazide (PRINZIDE,ZESTORETIC) 20-12.5 MG tablet Take 1 tablet by mouth daily.    . prochlorperazine (COMPAZINE) 10 MG tablet Take 1 tablet (10 mg total) by mouth every 6 (six) hours as needed for nausea or vomiting. 30 tablet 0   No current facility-administered medications for this visit.     SURGICAL HISTORY:  Past Surgical History:  Procedure Laterality Date  . cataract removed Right   . CHOLECYSTECTOMY    . COLONOSCOPY    . ENDARTERECTOMY Left  07/14/2013   Procedure: ENDARTERECTOMY CAROTID-LEFT;  Surgeon: Elam Dutch, MD;  Location: [facility];  Service: Vascular;  Laterality: Left;  . EYE SURGERY Left    cataract  . IR PERC PLEURAL DRAIN W/INDWELL CATH W/IMG GUIDE  05/23/2017  . KNEE SURGERY Left 14yr ago  . LAPAROSCOPIC PARTIAL COLECTOMY N/A 09/02/2015   Procedure: LAPAROSCOPIC PARTIAL RIGHT COLECTOMY;  Surgeon: ALeighton Ruff MD;  Location: [facility];  Service: General;  Laterality: N/A;  . PATCH ANGIOPLASTY Left 07/14/2013   Procedure: LEFT CAROTID ARTERY PATCH ANGIOPLASTY;  Surgeon: CElam Dutch MD;  [facility]  Service: Vascular;  Laterality: Left;  .Marland KitchenVIDEO BRONCHOSCOPY N/A 06/04/2018   Procedure: VIDEO BRONCHOSCOPY;  Surgeon: HMelrose Nakayama MD;  [facility];  Service: Thoracic;  Laterality: N/A;  . wisdom      REVIEW OF SYSTEMS:   Review of Systems  Constitutional: Negative for appetite change, chills, fever and unexpected weight change.  Positive for fatigue. HENT:   Negative for mouth sores, nosebleeds, sore throat and trouble swallowing.   Eyes: Negative for eye problems and icterus.  Respiratory: Negative for cough, hemoptysis, shortness of breath and wheezing.   Cardiovascular: Negative for chest pain and leg swelling.  Gastrointestinal: Negative for abdominal pain, constipation, diarrhea, nausea and vomiting.  Genitourinary: Negative for bladder incontinence, difficulty urinating, dysuria, frequency and hematuria.   Musculoskeletal: Negative for back pain, gait problem, neck pain and neck stiffness.  Skin: Negative for itching and rash.  Neurological: Negative for dizziness, extremity weakness, gait problem, headaches, light-headedness.  Hematological: Negative for adenopathy. Does not bruise/bleed easily.  Psychiatric/Behavioral: Negative for confusion, depression and sleep disturbance. The patient is not nervous/anxious.     PHYSICAL EXAMINATION:  Blood pressure (!) 156/72, pulse 81,  temperature 98.6 F (37 C), temperature source Oral, resp. rate 16, height 5' (1.524 m), weight 138 lb 1.6 oz (62.6 kg), SpO2 97 %.  ECOG PERFORMANCE STATUS: 1 - Symptomatic but completely ambulatory  Physical Exam  Constitutional: Oriented to person, place, and time and well-developed, well-nourished, and in no distress. No distress.  HENT:  Head: Normocephalic and atraumatic.  Mouth/Throat: Oropharynx is clear and moist. No oropharyngeal exudate.  Eyes: Conjunctivae are normal. Right eye exhibits no discharge. Left eye exhibits no discharge. No scleral icterus.  Neck: Normal range of motion. Neck supple.  Cardiovascular: Normal rate, regular rhythm, normal heart sounds and intact distal pulses.   Pulmonary/Chest: Effort normal and breath sounds normal. No respiratory distress. No wheezes. No rales.  Abdominal: Soft. Bowel sounds are normal. Exhibits no distension and no mass. There is no tenderness.  Musculoskeletal: Normal range of motion. Exhibits no edema.  Lymphadenopathy:    No cervical adenopathy.  Neurological: Alert and oriented to person, place, and time. Exhibits normal muscle tone. Gait normal. Coordination normal.  Skin: Skin is warm and dry. No rash noted. Not diaphoretic. No erythema. No pallor.  Psychiatric: Mood, memory and judgment  normal.  Vitals reviewed.  LABORATORY DATA: Lab Results  Component Value Date   WBC 18.7 (H) 06/09/2018   HGB 9.5 (L) 06/09/2018   HCT 30.6 (L) 06/09/2018   MCV 86.7 06/09/2018   PLT 671 (H) 06/09/2018      Chemistry      Component Value Date/Time   NA 143 06/09/2018 0846   NA 139 05/07/2018 1147   K 3.8 06/09/2018 0846   CL 105 06/09/2018 0846   CO2 29 06/09/2018 0846   BUN 11 06/09/2018 0846   BUN 15 05/07/2018 1147   CREATININE 0.83 06/09/2018 0846   CREATININE 0.87 03/08/2015 0001      Component Value Date/Time   CALCIUM 9.2 06/09/2018 0846   ALKPHOS 72 06/09/2018 0846   AST 14 (L) 06/09/2018 0846   ALT 7 06/09/2018  0846   BILITOT 0.4 06/09/2018 0846       RADIOGRAPHIC STUDIES:  Dg Chest 2 View  Result Date: 06/03/2018 CLINICAL DATA:  Preoperative study prior to video bronchoscopy tomorrow. Right upper lobe Walker mass. EXAM: CHEST - 2 VIEW COMPARISON:  Chest x-ray of May 04, 2018 FINDINGS: The lungs are mildly hyperinflated with hemidiaphragm flattening. There is persistent right upper lobe soft tissue mass. It has increased slightly in conspicuity. There is a trace of pleural fluid blunting the right costophrenic angles. The left Walker is clear. The heart and pulmonary vascularity are normal. There is calcification in the wall of the aortic arch. The trachea is midline. The bony thorax exhibits no acute abnormality. IMPRESSION: COPD. Persistent right upper lobe mass which is slightly more conspicuous today. Trace right pleural effusion new since the previous study. Thoracic aortic atherosclerosis. Electronically Signed   By: David  Martinique M.D.   On: 06/03/2018 08:30   Mr Jeri Cos EX Contrast  Addendum Date: 06/02/2018   Upon further review of patient's imaging films dating back to January 2015 the left frontal lesion at the most was 2 cm at that time but on the present scan appears to be slightly larger 2.3 cm in maximum AP dimension this would be unusual for infarct which should shrink over time and the possibility of low-grade glioma or tumor seems a possibility.  Result Date: 06/02/2018  [facility] [address] [place], [place] [address] [phone] NEUROIMAGING REPORT STUDY DATE:06/01/2018 PATIENT NAME: Nichole VESEY DOB: 05-30-1941 MRN: [id number] ORDERING CLINICIAN: Dr Leonie Man CLINICAL HISTORY:  47 year patient with seizures and stroke COMPARISON FILMS: MRI Brain 04/20/2018 EXAM: MRI Brain w/wo TECHNIQUE: MRI of the brain with and without contrast was obtained utilizing 5 mm axial slices with T1, T2, T2 flair, T2 star gradient echo and diffusion weighted views.  T1 sagittal,  T2 coronal and postcontrast views in the axial and coronal plane were obtained. CONTRAST:  Iv multihance IMAGING SITE: [facility] FINDINGS: The brain parenchyma shows area of encephalomalacia in the left posterior frontal region  likely compatible with remote age infarct however the lesion measures 2.3 cm in maximum AP dimension which actually is larger than the original described size of 10 x 20 mm on the scan in December 2014.  There is no surrounding enhancement or compensated dilatation of the lateral ventricle making it unusual..   Low-grade glioma could be possible though less likely.  This does not show any postcontrast enhancement.  There are mild degree of changes of age-appropriate chronic microvascular ischemia and generalized cerebral atrophy.  The paranasal sinuses show mild chronic inflammatory changes.  No other  structural lesion, tumor or infarcts are noted.No abnormal lesions are seen on diffusion-weighted views to suggest acute ischemia. The cortical sulci, fissures and cisterns are normal in size and appearance. Lateral, third and fourth ventricle are normal in size and appearance. No extra-axial fluid collections are seen. No evidence of mass effect or midline shift.  No abnormal lesions are seen on post contrast views.  On sagittal views the posterior fossa, pituitary gland and corpus callosum are unremarkable. No evidence of intracranial hemorrhage on gradient-echo views. The orbits and their contents, paranasal sinuses and calvarium are unremarkable.  Intracranial flow voids are present.   Abnormal MRI scan of the brain showing persistent area of encephalomalacia and gliosis in the left frontal region possibly remote age left frontal   infarct with slight or typical features and low-grade glioma is a consideration though less likely.  As well as mild changes of chronic microvascular ischemia and generalized cerebral atrophy.  No acute abnormalities are noted.  Postcontrast images do  not result in abnormal areas of enhancement.  Overall no significant change compared with previous MRI dated 04/20/2018 INTERPRETING PHYSICIAN: PRAMOD SETHI, MD Certified in  Neuroimaging by [employer] and [employer]     ASSESSMENT/PLAN:  Malignant neoplasm of Walker [facility]) This is a very pleasant 77 year old white female with recurrent non-small cell Walker cancer likely squamous cell carcinoma that was initially diagnosed as a stage IIA (T2b, N0, M0) in August 2018 status post curative stereotactic radiotherapy completed June 21, 2017.  The patient presented with concerning findings for disease recurrence and progression with enlarging and hypermetabolic right upper lobe Walker mass presenting as stage IIIa (T3, N0, M0).  She had a recent repeat biopsy  of the right upper lobe Walker lesion which was consistent with non-small cell Walker cancer, squamous cell.  PDL 1 is pending.  The patient was seen with Dr. Julien Nordmann.  Had a lengthy discussion with the patient and her daughter today regarding her current disease stage, prognosis, and treatment options.  Treatment options that were discussed with the patient and her daughter including a course of current chemoradiation with chemotherapy consisting of carboplatin for an AUC of 2 and paclitaxel 45 mg/m2 versus a referral to palliative care.  The patient is interested in pursuing treatment.  Adverse effects of this treatment were discussed including but not limited to myelosuppression, alopecia, nausea and vomiting, peripheral neuropathy, renal insufficiency.  We have referred the patient back to Dr. Sondra Come for discussion of radiation. She will have a chemotherapy education class.  Anticipate start date of 06/23/2018. The patient will follow-up in approximately 3 weeks for evaluation prior to cycle #2 of chemotherapy.  The patient was advised to call immediately if she has any concerning symptoms in the  interval.  The patient voices understanding of current disease status and treatment options and is in agreement with the current care plan.  All questions were answered. The patient knows to call the clinic with any problems, questions or concerns. We can certainly see the patient much sooner if necessary.   Orders Placed This Encounter  Procedures  . CMP ([facility] only)    Standing Status:   Standing    Number of Occurrences:   20    Standing Expiration Date:   06/10/2019  . CBC with Differential (Cancer Center Only)    Standing Status:   Standing    Number of Occurrences:   20    Standing Expiration Date:  06/10/2019  . Ambulatory referral to Radiation Oncology    Referral Priority:   Routine    Referral Type:   Consultation    Referral Reason:   Specialty Services Required    Referred to Provider:   Gery Pray, MD    Requested Specialty:   Radiation Oncology    Number of Visits Requested:   [place], DNP, AGPCNP-BC, AOCNP 06/09/18   ADDENDUM: Hematology/Oncology Attending: I had a face-to-face encounter with the patient today.  I recommended her care plan.  This is a very pleasant 77 years old white female with recurrent non-small cell Walker cancer, squamous cell carcinoma presented with large mass in the right upper lobe with extension into the right hilum and peripheral pleural service.  The patient also had small right paratracheal and precarinal lymph nodes.  She underwent bronchoscopy with biopsy under the care of Dr. Roxan Hockey recently the final pathology was consistent with recurrent squamous cell carcinoma.  I had a lengthy discussion with the patient and her daughter today about her condition.  Her case was discussed at the weekly thoracic conference and Dr. Sondra Come indicated that he may be able to give the patient concurrent radiotherapy. I recommended for the patient a course of concurrent chemoradiation with weekly carboplatin for AUC of 2 1  paclitaxel 45 mg/M2.  This will be followed by consolidation immunotherapy if the patient has no evidence for disease progression. She is expected to start the first dose of this treatment on June 23, 2018. We will refer the patient to Dr. Sondra Come for evaluation and discussion of the radiotherapy option. We discussed with the patient the adverse effect of this treatment and she would like to proceed with the treatment as planned. She will come back for follow-up visit in 3 weeks for evaluation and management of any adverse effect of her treatment. The patient was advised to call immediately if she has any concerning symptoms in the interval.  Disclaimer: This note was dictated with voice recognition software. Similar sounding words can inadvertently be transcribed and may be missed upon review. Eilleen Kempf, MD 06/09/18

## 2018-06-09 NOTE — Telephone Encounter (Signed)
Peer-to-peer review underwent at 9:50 AM with Dr. Marye Round for justification of PET scan that was ordered by Dr. Leonie Man after MRI w/wo contrast showed possible glioma.  Per Dr. Jennefer Bravo, a PET scan is not approved to rule out brain mets.  She was fully aware patient's history including primary lung cancer. Dr. Jennefer Bravo denied PET scan request for diagnosis of brain mets.

## 2018-06-09 NOTE — Telephone Encounter (Signed)
Peer to peer review scheduled for 0945.

## 2018-06-10 ENCOUNTER — Ambulatory Visit: Payer: Medicare Other

## 2018-06-10 DIAGNOSIS — G3184 Mild cognitive impairment, so stated: Secondary | ICD-10-CM

## 2018-06-10 DIAGNOSIS — G4089 Other seizures: Secondary | ICD-10-CM | POA: Diagnosis not present

## 2018-06-11 NOTE — Progress Notes (Signed)
Thoracic Location of Tumor / Histology: Squamous cell carcinoma, right upper lobe with increase in size.  PET scan 05/07/18: IMPRESSION: 1. Enlarging right upper lobe lung mass since the prior PET-CT. It previously measured 4.1 x 3.1 cm and now measures 6.3 x 5.4 cm. Marked hypermetabolism consistent with progressive tumor.  Biopsies 06/04/18 revealed:  Diagnosis TRANSBRONCHIAL NEEDLE ASPIRATON (A) NAVIGATION RIGHT UPPER LOBE BRUSHINGS- FOR QUICK STAIN (SPECIMEN 1 OF 1, COLLECTED ON 06/04/18): MALIGNANT CELLS PRESENT, CONSISTENT WITH NON SMALL CELL CARCINOMA. Diagnosis Lung, biopsy, right upper lobe - SQUAMOUS CELL CARCINOMA.  Tobacco/Marijuana/Snuff/ETOH use:  Tobacco Use  . Smoking status: Current Every Day Smoker    Packs/day: 0.50    Years: 60.00    Pack years: 30.00    Types: Cigarettes  . Smokeless tobacco: Never Used  Substance Use Topics  . Alcohol use: No    Alcohol/week: 0.0 standard drinks  . Drug use: No     Past/Anticipated interventions by cardiothoracic surgery, if any: 06/04/18:  PROCEDURE:  Video bronchoscopy with brushings and endobronchial biopsies.  SURGEON:  Modesto Charon, MD  Past/Anticipated interventions by medical oncology, if any: Per Dr. Julien Nordmann 06/09/18:  DIAGNOSIS: Recurrent non-small cell lung cancer likely squamous cell carcinoma that was initially diagnosed as a stage IIA(T2b, N0, M0) inAugust 2018status post curative stereotactic radiotherapy completed June 21, 2017. The patient presented today with concerning findings for disease recurrence and progression with enlarging and hypermetabolic right upper lobe lung mass presenting as stage IIIa (T3, N0, M0).  PRIOR THERAPY: Curative stereotactic radiotherapy completed on June 21, 2017.  CURRENT THERAPY: A course of concurrent chemoradiation with chemotherapy consisting of carboplatin for an AUC of 2 and paclitaxel 45 mg/m.  First dose expected on  06/23/2018.  Signs/Symptoms  Weight changes, if any: Denies recent weight loss or night sweats.  Respiratory complaints, if any: Denies chest pain, shortness of breath, cough, hemoptysis.  Denies nausea, vomiting, constipation, diarrhea.   Hemoptysis, if any: Denies  Pain issues, if any:  Pt denies c/o pain in chest.   SAFETY ISSUES: Prior radiation? Radiation treatment dates:   06/11/17-06/21/17  Site/dose:   Right lung / 50 Gy delivered in 5 fractions    Beams/energy:   SBRT/SRT-3D / 6X-FFF  Pacemaker/ICD? No  Possible current pregnancy? No, pt is post-menopausal  Is the patient on methotrexate? No  Current Complaints / other details:  Pt presents today for re-consult with Dr. Sondra Come for Radiation Oncology. Pt with painful, swollen ankles and feet. Pt with 1+ pitting edema and ankles and lower legs are taut. Pt reports swelling has been present for approximately 3 weeks. Pt is accompanied by daughter.

## 2018-06-13 ENCOUNTER — Inpatient Hospital Stay: Payer: Medicare Other

## 2018-06-16 ENCOUNTER — Other Ambulatory Visit: Payer: Self-pay

## 2018-06-17 ENCOUNTER — Encounter: Payer: Self-pay | Admitting: Radiation Oncology

## 2018-06-17 ENCOUNTER — Ambulatory Visit
Admission: RE | Admit: 2018-06-17 | Discharge: 2018-06-17 | Disposition: A | Discharge status: Home / Self Care | Payer: Medicare Other | Source: Ambulatory Visit | Attending: Radiation Oncology | Admitting: Radiation Oncology

## 2018-06-17 ENCOUNTER — Other Ambulatory Visit: Payer: Self-pay

## 2018-06-17 VITALS — BP 150/81 | HR 82 | Temp 97.9°F | Resp 18 | Ht 63.0 in | Wt 137.4 lb

## 2018-06-17 DIAGNOSIS — Z7982 Long term (current) use of aspirin: Secondary | ICD-10-CM | POA: Insufficient documentation

## 2018-06-17 DIAGNOSIS — C3491 Malignant neoplasm of unspecified part of right bronchus or lung: Secondary | ICD-10-CM

## 2018-06-17 DIAGNOSIS — Z923 Personal history of irradiation: Secondary | ICD-10-CM | POA: Diagnosis not present

## 2018-06-17 DIAGNOSIS — Z79899 Other long term (current) drug therapy: Secondary | ICD-10-CM | POA: Diagnosis not present

## 2018-06-17 DIAGNOSIS — C3411 Malignant neoplasm of upper lobe, right bronchus or lung: Secondary | ICD-10-CM | POA: Insufficient documentation

## 2018-06-17 DIAGNOSIS — Z510 Encounter for antineoplastic radiation therapy: Secondary | ICD-10-CM | POA: Insufficient documentation

## 2018-06-17 DIAGNOSIS — Z7984 Long term (current) use of oral hypoglycemic drugs: Secondary | ICD-10-CM | POA: Diagnosis not present

## 2018-06-17 DIAGNOSIS — D432 Neoplasm of uncertain behavior of brain, unspecified: Secondary | ICD-10-CM | POA: Diagnosis not present

## 2018-06-17 NOTE — Progress Notes (Signed)
  Radiation Oncology         [phone] ________________________________  Name: [name] MRN: [id number]  Date: 06/17/2018  DOB: 30-Jan-1941  SIMULATION AND TREATMENT PLANNING NOTE    ICD-10-CM   1. Stage III squamous cell carcinoma of right lung (HCC) C34.91     DIAGNOSIS: stage III squamous cell carcinoma of the right upper lung (recurrent)  NARRATIVE:  The patient was brought to the [facility].  Identity was confirmed.  All relevant records and images related to the planned course of therapy were reviewed.  The patient freely provided informed written consent to proceed with treatment after reviewing the details related to the planned course of therapy. The consent form was witnessed and verified by the simulation staff.  Then, the patient was set-up in a stable reproducible  supine position for radiation therapy.  CT images were obtained.  Surface markings were placed.  The CT images were loaded into the planning software.  Then the target and avoidance structures were contoured.  Treatment planning then occurred.  The radiation prescription was entered and confirmed.  Then, I designed and supervised the construction of a total of 5 medically necessary complex treatment devices.  I have requested : 3D Simulation  I have requested a DVH of the following structures: heart, lungs, spinal cord, esophagus, GTV, PTV.  I have ordered:dose calc.  PLAN:  The patient will receive 60 Gy in 30 fractions along with radiosensitizing chemotherapy.   Special Treatment Procedure Note: The patient will be receiving radiosensitizing chemotherapy. Given the potential of increased toxicities related to combined therapy and the necessity for close monitoring of the patient and blood work, this constitutes a special treatment procedure.  -----------------------------------  Blair Promise, PhD, MD

## 2018-06-17 NOTE — Progress Notes (Signed)
Radiation Oncology         [phone] ________________________________  Name: [name] MRN: [id number]  Date: 06/17/2018  DOB: 1941/07/09  Re-evaluation Note  CC: [name], MD  Maryanna Shape, NP    ICD-10-CM   1. Stage III squamous cell carcinoma of right lung [facility]) C34.91     Diagnosis:   Recurrent non-small cell lung cancer  Interval Since Last Radiation:  12 months  (End of Treatment Note  07/18/2017)     Diagnosis: Diagnosis:   Stage IIA (cT2b, cN0, cM0) non-small cell lung cancer  (squamous cell carcinoma) presenting in the right upper lobe lung     Indication for treatment:  Curative       Radiation treatment dates:   06/11/17-06/21/17  Site/dose:   Right lung / 50 Gy delivered in 5 fractions   Beams/energy:   SBRT/SRT-3D / 6X-FFF     Non-small cell lung cancer [facility])    Radiation Therapy     The patient saw No care team member to display for radiation treatment. This is the current list of radiation treatment: Radiation Treatments    Patient's record has no active or historical radiation treatments documented.         Stage III squamous cell carcinoma of right lung ([place])   06/09/2018 Initial Diagnosis    Stage III squamous cell carcinoma of right lung ([place])    06/23/2018 -  Chemotherapy    The patient had palonosetron (ALOXI) injection 0.25 mg, 0.25 mg, Intravenous,  Once, 0 of 7 cycles CARBOplatin (PARAPLATIN) 140 mg in sodium chloride 0.9 % 100 mL chemo infusion, 140 mg (100 % of original dose 143.2 mg), Intravenous,  Once, 0 of 7 cycles Dose modification: 143.2 mg (original dose 143.2 mg, Cycle 1) PACLitaxel (TAXOL) 72 mg in sodium chloride 0.9 % 150 mL chemo infusion (</= 4m/m2), 45 mg/m2 = 72 mg, Intravenous,  Once, 0 of 7 cycles  for chemotherapy treatment.      Narrative:  The patient returns today for further evaluation. After completion of her PET scan the patient was seen by Dr. MJulien Nordmannand then referred to Dr.  HRoxan Hockeyfor biopsy. Patient proceeded to undergo biopsy of the right upper lung mass with pathology consistent with squamous cell carcinoma. This was the same histology as her previous lung cancer. Patient has completed a brain MRI showing no evidence of metastatic disease. She is being evaluated by neurology for some confusion and memory issues as well as a lesion in the frontal area been present for at least 3 years.  Patient does complain of fatigue. She denies any pain in the chest area cough or hemoptysis. She denies any shortness of breath. Patient is somewhat down after losing her husband approximately a month ago. She denies any new bony pain.                           ALLERGIES:  is allergic to codeine; lipitor [atorvastatin]; and phenergan [promethazine hcl].  Meds: Current Outpatient Medications  Medication Sig Dispense Refill  . aspirin EC 81 MG tablet Take 81 mg by mouth daily.    . Blood Glucose Monitoring Suppl (ONE TOUCH ULTRA 2) w/Device KIT 1 Device by Does not apply route 2 (two) times daily. 1 each 0  . clopidogrel (PLAVIX) 75 MG tablet TAKE 1 TABLET BY MOUTH  DAILY (Patient taking differently: Take 75 mg by mouth daily. ) 90 tablet 3  .  divalproex (DEPAKOTE ER) 500 MG 24 hr tablet Take 1 tablet (500 mg total) by mouth daily. 30 tablet 3  . glucose blood (ONE TOUCH ULTRA TEST) test strip Use as instructed 100 each 12  . Lancets (ONETOUCH ULTRASOFT) lancets Use as instructed 100 each 12  . lisinopril (PRINIVIL,ZESTRIL) 10 MG tablet Take 1 tablet (10 mg total) by mouth daily. 90 tablet 3  . Multiple Vitamins-Minerals (MULTIVITAMIN PO) Take 1 tablet by mouth daily.    . prochlorperazine (COMPAZINE) 10 MG tablet Take 1 tablet (10 mg total) by mouth every 6 (six) hours as needed for nausea or vomiting. 30 tablet 0  . rosuvastatin (CRESTOR) 20 MG tablet TAKE 1 TABLET BY MOUTH ONCE DAILY (Patient taking differently: Take 20 mg by mouth daily. ) 180 tablet 0  .  sitaGLIPtin-metformin (JANUMET) 50-1000 MG tablet Take 1 tablet by mouth 2 (two) times daily. (Patient taking differently: Take 1 tablet by mouth 2 (two) times daily before a meal. ) 120 tablet 0  . vitamin B-12 (CYANOCOBALAMIN) 1000 MCG tablet Take 1,000 mcg by mouth daily.    Marland Kitchen lisinopril-hydrochlorothiazide (PRINZIDE,ZESTORETIC) 20-12.5 MG tablet Take 1 tablet by mouth daily.     No current facility-administered medications for this encounter.     Physical Findings: The patient is in no acute distress. Patient is alert and oriented.  height is '5\' 3"'  (1.6 m) and weight is 137 lb 6.4 oz (62.3 kg). Her oral temperature is 97.9 F (36.6 C). Her blood pressure is 150/81 (abnormal) and her pulse is 82. Her respiration is 18 and oxygen saturation is 97%. .  Lungs are clear to auscultation bilaterally. Heart has regular rate and rhythm. No palpable cervical, supraclavicular, or axillary adenopathy. Abdomen soft, non-tender, normal bowel sounds.  Lab Findings: Lab Results  Component Value Date   WBC 18.7 (H) 06/09/2018   HGB 9.5 (L) 06/09/2018   HCT 30.6 (L) 06/09/2018   MCV 86.7 06/09/2018   PLT 671 (H) 06/09/2018    Radiographic Findings: Dg Chest 2 View  Result Date: 06/03/2018 CLINICAL DATA:  Preoperative study prior to video bronchoscopy tomorrow. Right upper lobe lung mass. EXAM: CHEST - 2 VIEW COMPARISON:  Chest x-ray of May 04, 2018 FINDINGS: The lungs are mildly hyperinflated with hemidiaphragm flattening. There is persistent right upper lobe soft tissue mass. It has increased slightly in conspicuity. There is a trace of pleural fluid blunting the right costophrenic angles. The left lung is clear. The heart and pulmonary vascularity are normal. There is calcification in the wall of the aortic arch. The trachea is midline. The bony thorax exhibits no acute abnormality. IMPRESSION: COPD. Persistent right upper lobe mass which is slightly more conspicuous today. Trace right pleural  effusion new since the previous study. Thoracic aortic atherosclerosis. Electronically Signed   By: David  Martinique M.D.   On: 06/03/2018 08:30   Mr Jeri Cos VW Contrast  Addendum Date: 06/02/2018   Upon further review of patient's imaging films dating back to January 2015 the left frontal lesion at the most was 2 cm at that time but on the present scan appears to be slightly larger 2.3 cm in maximum AP dimension this would be unusual for infarct which should shrink over time and the possibility of low-grade glioma or tumor seems a possibility.  Result Date: 06/02/2018  [facility] [address] [place], [place] [address] [phone] NEUROIMAGING REPORT STUDY DATE:06/01/2018 PATIENT NAME: ADELA ESTEBAN DOB: [birth year] MRN: [id number] ORDERING CLINICIAN: Dr Leonie Man  CLINICAL HISTORY:  16 year patient with seizures and stroke COMPARISON FILMS: MRI Brain 04/20/2018 EXAM: MRI Brain w/wo TECHNIQUE: MRI of the brain with and without contrast was obtained utilizing 5 mm axial slices with T1, T2, T2 flair, T2 star gradient echo and diffusion weighted views.  T1 sagittal, T2 coronal and postcontrast views in the axial and coronal plane were obtained. CONTRAST:  Iv multihance IMAGING SITE: [facility] FINDINGS: The brain parenchyma shows area of encephalomalacia in the left posterior frontal region  likely compatible with remote age infarct however the lesion measures 2.3 cm in maximum AP dimension which actually is larger than the original described size of 10 x 20 mm on the scan in December 2014.  There is no surrounding enhancement or compensated dilatation of the lateral ventricle making it unusual..   Low-grade glioma could be possible though less likely.  This does not show any postcontrast enhancement.  There are mild degree of changes of age-appropriate chronic microvascular ischemia and generalized cerebral atrophy.  The paranasal sinuses show mild chronic inflammatory changes.  No  other structural lesion, tumor or infarcts are noted.No abnormal lesions are seen on diffusion-weighted views to suggest acute ischemia. The cortical sulci, fissures and cisterns are normal in size and appearance. Lateral, third and fourth ventricle are normal in size and appearance. No extra-axial fluid collections are seen. No evidence of mass effect or midline shift.  No abnormal lesions are seen on post contrast views.  On sagittal views the posterior fossa, pituitary gland and corpus callosum are unremarkable. No evidence of intracranial hemorrhage on gradient-echo views. The orbits and their contents, paranasal sinuses and calvarium are unremarkable.  Intracranial flow voids are present.   Abnormal MRI scan of the brain showing persistent area of encephalomalacia and gliosis in the left frontal region possibly remote age left frontal   infarct with slight or typical features and low-grade glioma is a consideration though less likely.  As well as mild changes of chronic microvascular ischemia and generalized cerebral atrophy.  No acute abnormalities are noted.  Postcontrast images do not result in abnormal areas of enhancement.  Overall no significant change compared with previous MRI dated 04/20/2018 INTERPRETING PHYSICIAN: PRAMOD SETHI, MD Certified in  Neuroimaging by [employer] and [employer]    Impression:  Stage III squamous cell carcinoma of lung(T4, No, M0). Patient was seen by Dr. Julien Nordmann and was felt to be a good candidate for combination therapy with radiation therapy directed at the large right upper lung mass and a sensitizing chemotherapy.I discussed the course of radiation therapy with the patient. She does understand that she would be at increased risk for complications given her prior SBRT directed in the same region. The patient would also be at risk for any rib fractures since hercurrent tumor is closely approaching the chest wall  and we will need to treat the chest wall area to full dose to get adequate coverage of her lesion.  Today, I talked to the patient and daughter about the findings and work-up thus far.  We discussed the natural history of non-small cell lung cancer and general treatment, highlighting the role of radiotherapy in the management.  We discussed the available radiation techniques, and focused on the details of logistics and delivery.  We reviewed the anticipated acute and late sequelae associated with radiation in this setting.  The patient was encouraged to ask questions that I answered to the best of my ability.  A patient  consent form was discussed and signed.  We retained a copy for our records.  The patient would like to proceed with radiation and will be scheduled for CT simulation.  Plan:  Patient will proceed with CT simulation later today with treatments to begin next week. Anticipate 6 weeks of radiation therapy along with weekly radiosensitizing chemotherapy.  ____________________________________ Gery Pray, MD

## 2018-06-17 NOTE — Patient Outreach (Signed)
[facility]) Care Management  06/16/2018  [name] 1940/12/21 [id number]   Successful outreach with [name]. She reported doing well. Compliant with taking medications as prescribed but reported inconsistent blood glucose monitoring. She forgot to check her blood glucose today and was unable to recall the last reading. Denied s/sx of hypoglycemia or hyperglycemia. Reported limited activity tolerance but able to perform ADLs.  Denied complaints of shortness of breath. No falls reported. Pending follow up regarding Lung Cancer staging and treatment plan. Denied urgent needs but stated that she will need follow up outreach regarding medication affordability. She is currently active with [facility].  [facility] CM Care Plan Problem One     Most Recent Value  Care Plan Problem One  Risk for Rehospitalization  Role Documenting the Problem One  Care Management Coordinator  Care Plan for Problem One  Active  THN Long Term Goal   Over the next 60 days, patient will not be readmitted for complications related to chronic disease management.  THN Long Term Goal Start Date  05/02/18  Interventions for Problem One Long Term Goal  Reviewed medications. Restarted Lisinopril last month. Encouraged to monitor blood glucose daily.   [facility]N CM Short Term Goal #1   Over the next 30 days, patient will take all medications as prescribed.  [facility] CM Short Term Goal #1 Start Date  05/02/18  [facility] CM Short Term Goal #1 Met Date  06/16/18  [facility]N CM Short Term Goal #2   Over the next 30 days patient will attend all scheduled MD appointments.  [facility]N CM Short Term Goal #2 Start Date  05/02/18  [facility] CM Short Term Goal #2 Met Date  06/16/18  [facility] CM Short Term Goal #3  Over the next 30 days patient will verbalize a plan related to smoking cessation.  THN CM Short Term Goal #3 Start Date  05/02/18  Interventions for Short Tern Goal #3  Patient continues to smoke. Will readdress options for telephonic counseling via  [place] [facility].    [facility] CM Care Plan Problem Two     Most Recent Value  Care Plan Problem Two  High Risk for Falls  Role Documenting the Problem Two  Care Management Coordinator  Care Plan for Problem Two  Active  THN Long Term Goal  Over the next 45 days patient will not have fall related injuries.  THN Long Term Goal Start Date  05/08/18  THN Long Term Goal Met Date  06/16/18  [facility] CM Short Term Goal #1   Over the next 30 days patient will practice safety measure and use assistive device when needed.  [facility]N CM Short Term Goal #1 Start Date  05/08/18  [facility] CM Short Term Goal #1 Met Date   06/16/18  [facility] CM Short Term Goal #2   Over the next 30 days patient will avoid activities that may cause overexertion.  THN CM Short Term Goal #2 Start Date  05/08/18  [facility] CM Short Term Goal #2 Met Date  06/16/18       PLAN Will follow up later this month.   [place] [phone]

## 2018-06-20 ENCOUNTER — Encounter (HOSPITAL_COMMUNITY): Payer: Medicare Other

## 2018-06-23 ENCOUNTER — Ambulatory Visit: Payer: Medicare Other

## 2018-06-23 ENCOUNTER — Other Ambulatory Visit: Payer: Self-pay | Admitting: Medical

## 2018-06-23 ENCOUNTER — Ambulatory Visit (HOSPITAL_BASED_OUTPATIENT_CLINIC_OR_DEPARTMENT_OTHER): Payer: Medicare Other | Admitting: Medical

## 2018-06-23 ENCOUNTER — Inpatient Hospital Stay: Payer: Medicare Other | Attending: Internal Medicine

## 2018-06-23 ENCOUNTER — Ambulatory Visit (HOSPITAL_COMMUNITY)
Admission: RE | Admit: 2018-06-23 | Discharge: 2018-06-23 | Disposition: A | Discharge status: Home / Self Care | Payer: Medicare Other | Source: Ambulatory Visit | Attending: Medical | Admitting: Medical

## 2018-06-23 ENCOUNTER — Ambulatory Visit: Payer: Medicare Other | Admitting: Radiation Oncology

## 2018-06-23 ENCOUNTER — Inpatient Hospital Stay: Payer: Medicare Other

## 2018-06-23 ENCOUNTER — Telehealth: Payer: Self-pay | Admitting: *Deleted

## 2018-06-23 ENCOUNTER — Other Ambulatory Visit: Payer: Self-pay | Admitting: *Deleted

## 2018-06-23 VITALS — BP 157/77 | HR 79 | Temp 98.8°F | Resp 30

## 2018-06-23 DIAGNOSIS — Z510 Encounter for antineoplastic radiation therapy: Secondary | ICD-10-CM | POA: Diagnosis not present

## 2018-06-23 DIAGNOSIS — E785 Hyperlipidemia, unspecified: Secondary | ICD-10-CM | POA: Insufficient documentation

## 2018-06-23 DIAGNOSIS — Z79899 Other long term (current) drug therapy: Secondary | ICD-10-CM | POA: Insufficient documentation

## 2018-06-23 DIAGNOSIS — Z8673 Personal history of transient ischemic attack (TIA), and cerebral infarction without residual deficits: Secondary | ICD-10-CM | POA: Insufficient documentation

## 2018-06-23 DIAGNOSIS — Z7982 Long term (current) use of aspirin: Secondary | ICD-10-CM | POA: Diagnosis not present

## 2018-06-23 DIAGNOSIS — Z7984 Long term (current) use of oral hypoglycemic drugs: Secondary | ICD-10-CM | POA: Diagnosis not present

## 2018-06-23 DIAGNOSIS — Z888 Allergy status to other drugs, medicaments and biological substances status: Secondary | ICD-10-CM | POA: Insufficient documentation

## 2018-06-23 DIAGNOSIS — K219 Gastro-esophageal reflux disease without esophagitis: Secondary | ICD-10-CM | POA: Insufficient documentation

## 2018-06-23 DIAGNOSIS — C3411 Malignant neoplasm of upper lobe, right bronchus or lung: Secondary | ICD-10-CM | POA: Diagnosis not present

## 2018-06-23 DIAGNOSIS — R000 Tachycardia, unspecified: Secondary | ICD-10-CM | POA: Diagnosis not present

## 2018-06-23 DIAGNOSIS — R634 Abnormal weight loss: Secondary | ICD-10-CM | POA: Insufficient documentation

## 2018-06-23 DIAGNOSIS — E1140 Type 2 diabetes mellitus with diabetic neuropathy, unspecified: Secondary | ICD-10-CM | POA: Diagnosis not present

## 2018-06-23 DIAGNOSIS — R5383 Other fatigue: Secondary | ICD-10-CM | POA: Diagnosis not present

## 2018-06-23 DIAGNOSIS — C3491 Malignant neoplasm of unspecified part of right bronchus or lung: Secondary | ICD-10-CM

## 2018-06-23 DIAGNOSIS — Z5111 Encounter for antineoplastic chemotherapy: Secondary | ICD-10-CM | POA: Diagnosis not present

## 2018-06-23 DIAGNOSIS — R0682 Tachypnea, not elsewhere classified: Secondary | ICD-10-CM

## 2018-06-23 DIAGNOSIS — I10 Essential (primary) hypertension: Secondary | ICD-10-CM | POA: Insufficient documentation

## 2018-06-23 DIAGNOSIS — Z923 Personal history of irradiation: Secondary | ICD-10-CM | POA: Insufficient documentation

## 2018-06-23 DIAGNOSIS — Z9049 Acquired absence of other specified parts of digestive tract: Secondary | ICD-10-CM | POA: Insufficient documentation

## 2018-06-23 LAB — CMP (CANCER CENTER ONLY)
ALT: <6 U/L (ref 0–44)
AST: 10 U/L — AB (ref 15–41)
Albumin: 2.8 g/dL — ABNORMAL LOW (ref 3.5–5.0)
Alkaline Phosphatase: 81 U/L (ref 38–126)
Anion gap: 12 (ref 5–15)
BUN: 13 mg/dL (ref 8–23)
CHLORIDE: 103 mmol/L (ref 98–111)
CO2: 27 mmol/L (ref 22–32)
Calcium: 9.9 mg/dL (ref 8.9–10.3)
Creatinine: 0.81 mg/dL (ref 0.44–1.00)
GFR, Est AFR Am: >60 mL/min (ref >60)
Glucose, Bld: 191 mg/dL — ABNORMAL HIGH (ref 70–99)
POTASSIUM: 3.7 mmol/L (ref 3.5–5.1)
Sodium: 142 mmol/L (ref 135–145)
Total Bilirubin: 0.4 mg/dL (ref 0.3–1.2)
Total Protein: 6.8 g/dL (ref 6.5–8.1)

## 2018-06-23 LAB — CBC WITH DIFFERENTIAL (CANCER CENTER ONLY)
Abs Immature Granulocytes: 0.03 10*3/uL (ref 0.00–0.07)
BASOS ABS: 0 10*3/uL (ref 0.0–0.1)
BASOS PCT: 0 %
EOS ABS: 1.4 10*3/uL — AB (ref 0.0–0.5)
Eosinophils Relative: 10 %
HCT: 31.9 % — ABNORMAL LOW (ref 36.0–46.0)
Hemoglobin: 10 g/dL — ABNORMAL LOW (ref 12.0–15.0)
IMMATURE GRANULOCYTES: 0 %
LYMPHS ABS: 1.5 10*3/uL (ref 0.7–4.0)
LYMPHS PCT: 11 %
MCH: 26.2 pg (ref 26.0–34.0)
MCHC: 31.3 g/dL (ref 30.0–36.0)
MCV: 83.7 fL (ref 80.0–100.0)
Monocytes Absolute: 1 10*3/uL (ref 0.1–1.0)
Monocytes Relative: 8 %
NEUTROS PCT: 71 %
Neutro Abs: 9.8 10*3/uL — ABNORMAL HIGH (ref 1.7–7.7)
PLATELETS: 677 10*3/uL — AB (ref 150–400)
RBC: 3.81 MIL/uL — AB (ref 3.87–5.11)
RDW: 16.8 % — AB (ref 11.5–15.5)
WBC Count: 13.7 10*3/uL — ABNORMAL HIGH (ref 4.0–10.5)
nRBC: 0 % (ref 0.0–0.2)

## 2018-06-23 MED ORDER — SODIUM CHLORIDE 0.9 % IV SOLN
Freq: Once | INTRAVENOUS | Status: AC
  Administered 2018-06-23: 10:00:00 via INTRAVENOUS
  Filled 2018-06-23: qty 250

## 2018-06-23 MED ORDER — DIPHENHYDRAMINE HCL 50 MG/ML IJ SOLN
50.0000 mg | Freq: Once | INTRAMUSCULAR | Status: AC
  Administered 2018-06-23: 50 mg via INTRAVENOUS

## 2018-06-23 MED ORDER — SODIUM CHLORIDE 0.9 % IV SOLN
140.0000 mg | Freq: Once | INTRAVENOUS | Status: AC
  Administered 2018-06-23: 140 mg via INTRAVENOUS
  Filled 2018-06-23: qty 14

## 2018-06-23 MED ORDER — PALONOSETRON HCL INJECTION 0.25 MG/5ML
INTRAVENOUS | Status: AC
  Filled 2018-06-23: qty 5

## 2018-06-23 MED ORDER — FAMOTIDINE IN NACL 20-0.9 MG/50ML-% IV SOLN
INTRAVENOUS | Status: AC
  Filled 2018-06-23: qty 50

## 2018-06-23 MED ORDER — SODIUM CHLORIDE 0.9 % IV SOLN
45.0000 mg/m2 | Freq: Once | INTRAVENOUS | Status: AC
  Administered 2018-06-23: 72 mg via INTRAVENOUS
  Filled 2018-06-23: qty 12

## 2018-06-23 MED ORDER — PALONOSETRON HCL INJECTION 0.25 MG/5ML
0.2500 mg | Freq: Once | INTRAVENOUS | Status: AC
  Administered 2018-06-23: 0.25 mg via INTRAVENOUS

## 2018-06-23 MED ORDER — FAMOTIDINE IN NACL 20-0.9 MG/50ML-% IV SOLN
20.0000 mg | Freq: Once | INTRAVENOUS | Status: AC
  Administered 2018-06-23: 20 mg via INTRAVENOUS

## 2018-06-23 MED ORDER — ALPRAZOLAM 0.25 MG PO TABS: 0.2500 mg | ORAL_TABLET | Freq: Three times a day (TID) | ORAL | 0 refills | Status: DC | PRN

## 2018-06-23 MED ORDER — SODIUM CHLORIDE 0.9 % IV SOLN
20.0000 mg | Freq: Once | INTRAVENOUS | Status: AC
  Administered 2018-06-23: 20 mg via INTRAVENOUS
  Filled 2018-06-23: qty 2

## 2018-06-23 MED ORDER — DIPHENHYDRAMINE HCL 50 MG/ML IJ SOLN
INTRAMUSCULAR | Status: AC
  Filled 2018-06-23: qty 1

## 2018-06-23 NOTE — Telephone Encounter (Signed)
Received VM message from pt's daughter, Novella Rob. She states that her mother received 1st time Taxol/Carbo this morning. Initially she experienced some shortness of breath, tachypnea and was seen by Sandi Mealy, PA. She was able to receive all her of her treatment. By the end of the treatment , [name] states that patient became agitated and more confused than her baseline and argumetative with staff, pulling her IV out after the treatment. Once home, [name] states her mother has become even more agitated,, disoriented, hallucinating and will not hold still, sit down and continues to be argumentative.  [name] is very concerned as she cannot get her mother to calm down. She has not taken any of her daily meds yet (see med list). Pt is diabetic and normally takes Janumet. She did receive Decadron 20 mg, Benadryl 50 mg during treatment. The agitation started after the decadron and benadryl.  Discussed situation with Dr. Benay Spice as Dr. Julien Nordmann not in office today. Pt's agiation and disorientation/hallucinations could be due to Decadron and benadryl.  He advised Xanax 0.25 mg 1-2 tablets every 8 hours as needed for her symptoms. If she does not settle down and return to her baseline mental status, then family should take her to ED.  Xanax called in to pt's pharmacy.  Call back to [facility] after discussion with Dr. Benay Spice. Guy Franco of the above instructions Also advised [name] to give her mother her normal daily meds and to check her blood sugar as well as decadron can raise that level.  [name] voiced understanding of instructions. [name] will need call back tomorrow morning to check on her mother's status.

## 2018-06-23 NOTE — Progress Notes (Signed)
Pt respirations 30-32 per minute. Pt denies any shortness of breath. Pt skin warm and dry. Pt talking in full and complete sentences. Reviewed pt vitals with Dr. Benay Spice who stated she needed to be assessed by Lucianne Lei, [place]. Lucianne Lei, PA at chairside to assess pt. Per Lucianne Lei, [place] ok to proceed with treatment with current vital signs. Pt denies any other complaints. Daughter with patient who states this is patient baseline.

## 2018-06-23 NOTE — Telephone Encounter (Signed)
Called patient with test results. Left message to call back.

## 2018-06-23 NOTE — Patient Instructions (Signed)
[name] Discharge Instructions for Patients Receiving Chemotherapy  Today you received the following chemotherapy agents Taxol and Carboplatin  To help prevent nausea and vomiting after your treatment, we encourage you to take your nausea medication as prescribed by MD. **DO NOT TAKE ZOFRAN FOR 3 DAYS AFTER TREATMENT**   If you develop nausea and vomiting that is not controlled by your nausea medication, call the clinic.   BELOW ARE SYMPTOMS THAT SHOULD BE REPORTED IMMEDIATELY:  *FEVER GREATER THAN 100.5 F  *CHILLS WITH OR WITHOUT FEVER  NAUSEA AND VOMITING THAT IS NOT CONTROLLED WITH YOUR NAUSEA MEDICATION  *UNUSUAL SHORTNESS OF BREATH  *UNUSUAL BRUISING OR BLEEDING  TENDERNESS IN MOUTH AND THROAT WITH OR WITHOUT PRESENCE OF ULCERS  *URINARY PROBLEMS  *BOWEL PROBLEMS  UNUSUAL RASH Items with * indicate a potential emergency and should be followed up as soon as possible.  Feel free to call the clinic should you have any questions or concerns. The clinic phone number is [phone].  Please show the [facility] at check-in to the Emergency Department and triage nurse.  Carboplatin injection What is this medicine? CARBOPLATIN (KAR boe pla tin) is a chemotherapy drug. It targets fast dividing cells, like cancer cells, and causes these cells to die. This medicine is used to treat ovarian cancer and many other cancers. This medicine may be used for other purposes; ask your health care provider or pharmacist if you have questions. COMMON BRAND NAME(S): Paraplatin What should I tell my health care provider before I take this medicine? They need to know if you have any of these conditions: -blood disorders -hearing problems -kidney disease -recent or ongoing radiation therapy -an unusual or allergic reaction to carboplatin, cisplatin, other chemotherapy, other medicines, foods, dyes, or preservatives -pregnant or trying to get  pregnant -breast-feeding How should I use this medicine? This drug is usually given as an infusion into a vein. It is administered in a hospital or clinic by a specially trained health care professional. Talk to your pediatrician regarding the use of this medicine in children. Special care may be needed. Overdosage: If you think you have taken too much of this medicine contact a poison control center or emergency room at once. NOTE: This medicine is only for you. Do not share this medicine with others. What if I miss a dose? It is important not to miss a dose. Call your doctor or health care professional if you are unable to keep an appointment. What may interact with this medicine? -medicines for seizures -medicines to increase blood counts like filgrastim, pegfilgrastim, sargramostim -some antibiotics like amikacin, gentamicin, neomycin, streptomycin, tobramycin -vaccines Talk to your doctor or health care professional before taking any of these medicines: -acetaminophen -aspirin -ibuprofen -ketoprofen -naproxen This list may not describe all possible interactions. Give your health care provider a list of all the medicines, herbs, non-prescription drugs, or dietary supplements you use. Also tell them if you smoke, drink alcohol, or use illegal drugs. Some items may interact with your medicine. What should I watch for while using this medicine? Your condition will be monitored carefully while you are receiving this medicine. You will need important blood work done while you are taking this medicine. This drug may make you feel generally unwell. This is not uncommon, as chemotherapy can affect healthy cells as well as cancer cells. Report any side effects. Continue your course of treatment even though you feel ill unless your doctor tells you to stop. In some cases,  you may be given additional medicines to help with side effects. Follow all directions for their use. Call your doctor or  health care professional for advice if you get a fever, chills or sore throat, or other symptoms of a cold or flu. Do not treat yourself. This drug decreases your body's ability to fight infections. Try to avoid being around people who are sick. This medicine may increase your risk to bruise or bleed. Call your doctor or health care professional if you notice any unusual bleeding. Be careful brushing and flossing your teeth or using a toothpick because you may get an infection or bleed more easily. If you have any dental work done, tell your dentist you are receiving this medicine. Avoid taking products that contain aspirin, acetaminophen, ibuprofen, naproxen, or ketoprofen unless instructed by your doctor. These medicines may hide a fever. Do not become pregnant while taking this medicine. Women should inform their doctor if they wish to become pregnant or think they might be pregnant. There is a potential for serious side effects to an unborn child. Talk to your health care professional or pharmacist for more information. Do not breast-feed an infant while taking this medicine. What side effects may I notice from receiving this medicine? Side effects that you should report to your doctor or health care professional as soon as possible: -allergic reactions like skin rash, itching or hives, swelling of the face, lips, or tongue -signs of infection - fever or chills, cough, sore throat, pain or difficulty passing urine -signs of decreased platelets or bleeding - bruising, pinpoint red spots on the skin, black, tarry stools, nosebleeds -signs of decreased red blood cells - unusually weak or tired, fainting spells, lightheadedness -breathing problems -changes in hearing -changes in vision -chest pain -high blood pressure -low blood counts - This drug may decrease the number of white blood cells, red blood cells and platelets. You may be at increased risk for infections and bleeding. -nausea and  vomiting -pain, swelling, redness or irritation at the injection site -pain, tingling, numbness in the hands or feet -problems with balance, talking, walking -trouble passing urine or change in the amount of urine Side effects that usually do not require medical attention (report to your doctor or health care professional if they continue or are bothersome): -hair loss -loss of appetite -metallic taste in the mouth or changes in taste This list may not describe all possible side effects. Call your doctor for medical advice about side effects. You may report side effects to FDA at 1-800-FDA-1088. Where should I keep my medicine? This drug is given in a hospital or clinic and will not be stored at home. NOTE: This sheet is a summary. It may not cover all possible information. If you have questions about this medicine, talk to your doctor, pharmacist, or health care provider.  2018 Elsevier/Gold Standard (2007-10-07 14:38:05)   Paclitaxel injection What is this medicine? PACLITAXEL (PAK li TAX el) is a chemotherapy drug. It targets fast dividing cells, like cancer cells, and causes these cells to die. This medicine is used to treat ovarian cancer, breast cancer, and other cancers. This medicine may be used for other purposes; ask your health care provider or pharmacist if you have questions. COMMON BRAND NAME(S): Onxol, Taxol What should I tell my health care provider before I take this medicine? They need to know if you have any of these conditions: -blood disorders -irregular heartbeat -infection (especially a virus infection such as chickenpox, cold sores, or herpes) -  liver disease -previous or ongoing radiation therapy -an unusual or allergic reaction to paclitaxel, alcohol, polyoxyethylated castor oil, other chemotherapy agents, other medicines, foods, dyes, or preservatives -pregnant or trying to get pregnant -breast-feeding How should I use this medicine? This drug is given as an  infusion into a vein. It is administered in a hospital or clinic by a specially trained health care professional. Talk to your pediatrician regarding the use of this medicine in children. Special care may be needed. Overdosage: If you think you have taken too much of this medicine contact a poison control center or emergency room at once. NOTE: This medicine is only for you. Do not share this medicine with others. What if I miss a dose? It is important not to miss your dose. Call your doctor or health care professional if you are unable to keep an appointment. What may interact with this medicine? Do not take this medicine with any of the following medications: -disulfiram -metronidazole This medicine may also interact with the following medications: -cyclosporine -diazepam -ketoconazole -medicines to increase blood counts like filgrastim, pegfilgrastim, sargramostim -other chemotherapy drugs like cisplatin, doxorubicin, epirubicin, etoposide, teniposide, vincristine -quinidine -testosterone -vaccines -verapamil Talk to your doctor or health care professional before taking any of these medicines: -acetaminophen -aspirin -ibuprofen -ketoprofen -naproxen This list may not describe all possible interactions. Give your health care provider a list of all the medicines, herbs, non-prescription drugs, or dietary supplements you use. Also tell them if you smoke, drink alcohol, or use illegal drugs. Some items may interact with your medicine. What should I watch for while using this medicine? Your condition will be monitored carefully while you are receiving this medicine. You will need important blood work done while you are taking this medicine. This medicine can cause serious allergic reactions. To reduce your risk you will need to take other medicine(s) before treatment with this medicine. If you experience allergic reactions like skin rash, itching or hives, swelling of the face, lips, or  tongue, tell your doctor or health care professional right away. In some cases, you may be given additional medicines to help with side effects. Follow all directions for their use. This drug may make you feel generally unwell. This is not uncommon, as chemotherapy can affect healthy cells as well as cancer cells. Report any side effects. Continue your course of treatment even though you feel ill unless your doctor tells you to stop. Call your doctor or health care professional for advice if you get a fever, chills or sore throat, or other symptoms of a cold or flu. Do not treat yourself. This drug decreases your body's ability to fight infections. Try to avoid being around people who are sick. This medicine may increase your risk to bruise or bleed. Call your doctor or health care professional if you notice any unusual bleeding. Be careful brushing and flossing your teeth or using a toothpick because you may get an infection or bleed more easily. If you have any dental work done, tell your dentist you are receiving this medicine. Avoid taking products that contain aspirin, acetaminophen, ibuprofen, naproxen, or ketoprofen unless instructed by your doctor. These medicines may hide a fever. Do not become pregnant while taking this medicine. Women should inform their doctor if they wish to become pregnant or think they might be pregnant. There is a potential for serious side effects to an unborn child. Talk to your health care professional or pharmacist for more information. Do not breast-feed an infant while taking  this medicine. Men are advised not to father a child while receiving this medicine. This product may contain alcohol. Ask your pharmacist or healthcare provider if this medicine contains alcohol. Be sure to tell all healthcare providers you are taking this medicine. Certain medicines, like metronidazole and disulfiram, can cause an unpleasant reaction when taken with alcohol. The reaction includes  flushing, headache, nausea, vomiting, sweating, and increased thirst. The reaction can last from 30 minutes to several hours. What side effects may I notice from receiving this medicine? Side effects that you should report to your doctor or health care professional as soon as possible: -allergic reactions like skin rash, itching or hives, swelling of the face, lips, or tongue -low blood counts - This drug may decrease the number of white blood cells, red blood cells and platelets. You may be at increased risk for infections and bleeding. -signs of infection - fever or chills, cough, sore throat, pain or difficulty passing urine -signs of decreased platelets or bleeding - bruising, pinpoint red spots on the skin, black, tarry stools, nosebleeds -signs of decreased red blood cells - unusually weak or tired, fainting spells, lightheadedness -breathing problems -chest pain -high or low blood pressure -mouth sores -nausea and vomiting -pain, swelling, redness or irritation at the injection site -pain, tingling, numbness in the hands or feet -slow or irregular heartbeat -swelling of the ankle, feet, hands Side effects that usually do not require medical attention (report to your doctor or health care professional if they continue or are bothersome): -bone pain -complete hair loss including hair on your head, underarms, pubic hair, eyebrows, and eyelashes -changes in the color of fingernails -diarrhea -loosening of the fingernails -loss of appetite -muscle or joint pain -red flush to skin -sweating This list may not describe all possible side effects. Call your doctor for medical advice about side effects. You may report side effects to FDA at 1-800-FDA-1088. Where should I keep my medicine? This drug is given in a hospital or clinic and will not be stored at home. NOTE: This sheet is a summary. It may not cover all possible information. If you have questions about this medicine, talk to your  doctor, pharmacist, or health care provider.  2018 Elsevier/Gold Standard (2015-05-03 19:58:00)

## 2018-06-23 NOTE — Telephone Encounter (Signed)
-----   Message from Charlie Pitter, [place] sent at 06/19/2018  4:47 PM EST ----- Cardiology asked to do 30 day monitor for h/o stroke Please let patient know event monitor showed normal rhythms Please forward to whichever neurologist she was due to f/u with as cardiology is not otherwise involved in her care Dayna Dunn PA-C

## 2018-06-24 ENCOUNTER — Telehealth: Payer: Self-pay | Admitting: *Deleted

## 2018-06-24 ENCOUNTER — Ambulatory Visit
Admission: RE | Admit: 2018-06-24 | Discharge: 2018-06-24 | Disposition: A | Discharge status: Home / Self Care | Payer: Medicare Other | Source: Ambulatory Visit | Attending: Radiation Oncology | Admitting: Radiation Oncology

## 2018-06-24 DIAGNOSIS — C3491 Malignant neoplasm of unspecified part of right bronchus or lung: Secondary | ICD-10-CM

## 2018-06-24 DIAGNOSIS — Z510 Encounter for antineoplastic radiation therapy: Secondary | ICD-10-CM | POA: Diagnosis not present

## 2018-06-24 DIAGNOSIS — C3411 Malignant neoplasm of upper lobe, right bronchus or lung: Secondary | ICD-10-CM | POA: Diagnosis not present

## 2018-06-24 DIAGNOSIS — Z923 Personal history of irradiation: Secondary | ICD-10-CM | POA: Diagnosis not present

## 2018-06-24 MED ORDER — SONAFINE EX EMUL
1.0000 "application " | Freq: Two times a day (BID) | CUTANEOUS | Status: DC
  Administered 2018-06-24: 1 via TOPICAL

## 2018-06-24 NOTE — Progress Notes (Signed)
  Radiation Oncology         [phone] ________________________________  Name: [name] MRN: [id number]  Date: 06/24/2018  DOB: 12-Feb-1941  Simulation Verification Note    ICD-10-CM   1. Stage III squamous cell carcinoma of right lung (HCC) C34.91 SONAFINE emulsion 1 application  2. Non-small cell cancer of right lung (HCC) C34.91     Status: outpatient  NARRATIVE: The patient was brought to the treatment unit and placed in the planned treatment position. The clinical setup was verified. Then port films were obtained and uploaded to the radiation oncology medical record software.  The treatment beams were carefully compared against the planned radiation fields. The position location and shape of the radiation fields was reviewed. They targeted volume of tissue appears to be appropriately covered by the radiation beams. Organs at risk appear to be excluded as planned.  Based on my personal review, I approved the simulation verification. The patient's treatment will proceed as planned.  -----------------------------------  Blair Promise, PhD, MD

## 2018-06-24 NOTE — Telephone Encounter (Signed)
TCT pt's daughter this morning to follow up on pt's agitation/hallucinatios from yesterday. No answer but was able to leave vm message for daughter to call back with an update on her mother.

## 2018-06-24 NOTE — Progress Notes (Signed)
Pt here for patient teaching.  Pt given Radiation and You booklet, skin care instructions and Sonafine.  Reviewed areas of pertinence such as fatigue, nausea and vomiting, skin changes, throat changes, cough and shortness of breath . Pt able to give teach back of to pat skin and use unscented/gentle soap,apply Radiaplex bid, avoid wearing an under wire bra and to use an electric razor if they must shave. Pt demonstrated understanding and verbalizes understanding of information given and will contact nursing with any questions or concerns.     Http://[email or web address]/treatmentinformation/whattoexpect/index   Loma Sousa, RN BSN

## 2018-06-24 NOTE — Progress Notes (Signed)
Late Entry:  06/23/2018 at 1220: Pt becoming noticeably more anxious and agitated. Pt standing up and trying to walk away from chair with unsteady gait. When attempting to re-direct pt, pt becomes angry stating "I can do what I want" Pt given redirection to watch tv or distracted with snacks. Pt daughter at chairside stating she gets agitated at times. Pt walked by this RN through hallways noticing pt attempting to stop and sit in other chairs not assigned to her. Pt appears confused when answering questions or attempting to educate pt. Discussed with daughter at next MD appointment to maybe ask for smaller dose of Benadryl since patient was becoming more and more agitated.  1320: Per report from [place] RN pt become agitated and stood up and attempted to walk off from chair assignment and pulled out PIV. Pt had completed chemotherapy and was receiving NS. Per report pt just stood there and was hard to redirect to chair to provide care. Ramond Marrow Nurse Tech was sitting with patient to help with redirection and provide safety. Per Ramond Marrow when she attempted to redirect pt multiple times pt became more aggressive and confused. Refer to PIV flowsheet note from [place], [place]. Pt discharged and taken by wheelchair to radiology with daughter.

## 2018-06-25 ENCOUNTER — Ambulatory Visit
Admission: RE | Admit: 2018-06-25 | Discharge: 2018-06-25 | Disposition: A | Discharge status: Home / Self Care | Payer: Medicare Other | Source: Ambulatory Visit | Attending: Radiation Oncology | Admitting: Radiation Oncology

## 2018-06-25 DIAGNOSIS — Z923 Personal history of irradiation: Secondary | ICD-10-CM | POA: Diagnosis not present

## 2018-06-25 DIAGNOSIS — Z510 Encounter for antineoplastic radiation therapy: Secondary | ICD-10-CM | POA: Diagnosis not present

## 2018-06-25 DIAGNOSIS — C3411 Malignant neoplasm of upper lobe, right bronchus or lung: Secondary | ICD-10-CM | POA: Diagnosis not present

## 2018-06-25 NOTE — Progress Notes (Signed)
The patient is a 77 year old female with a recurrent non-small cell lung cancer likely squamous cell carcinoma who is managed by Dr. Julien Nordmann.  She was seen in the infusion room today as she was receiving cycle 1 of carboplatin and paclitaxel.  She was seen due to a respiration rate of 32.  The patient was afebrile.  She denied any issues of concern.  She denied a productive cough, fevers, chills, or sweats.  Patient was referred for a chest x-ray which showed:  1. RIGHT upper lobe mass. 2. Mild bronchitic changes. 3. Aortic Atherosclerosis    The patient's daughter was called with these results.  No antibiotic was indicated.  Sandi Mealy, MHS, PA-C Physician Assistant

## 2018-06-26 ENCOUNTER — Ambulatory Visit
Admission: RE | Admit: 2018-06-26 | Discharge: 2018-06-26 | Disposition: A | Discharge status: Home / Self Care | Payer: Medicare Other | Source: Ambulatory Visit | Attending: Radiation Oncology | Admitting: Radiation Oncology

## 2018-06-26 DIAGNOSIS — C3411 Malignant neoplasm of upper lobe, right bronchus or lung: Secondary | ICD-10-CM | POA: Diagnosis not present

## 2018-06-26 DIAGNOSIS — Z510 Encounter for antineoplastic radiation therapy: Secondary | ICD-10-CM | POA: Diagnosis not present

## 2018-06-26 DIAGNOSIS — Z923 Personal history of irradiation: Secondary | ICD-10-CM | POA: Diagnosis not present

## 2018-06-27 ENCOUNTER — Ambulatory Visit
Admission: RE | Admit: 2018-06-27 | Discharge: 2018-06-27 | Disposition: A | Discharge status: Home / Self Care | Payer: Medicare Other | Source: Ambulatory Visit | Attending: Radiation Oncology | Admitting: Radiation Oncology

## 2018-06-27 DIAGNOSIS — Z510 Encounter for antineoplastic radiation therapy: Secondary | ICD-10-CM | POA: Diagnosis not present

## 2018-06-27 DIAGNOSIS — C3411 Malignant neoplasm of upper lobe, right bronchus or lung: Secondary | ICD-10-CM | POA: Diagnosis not present

## 2018-06-27 DIAGNOSIS — Z923 Personal history of irradiation: Secondary | ICD-10-CM | POA: Diagnosis not present

## 2018-06-30 ENCOUNTER — Ambulatory Visit
Admission: RE | Admit: 2018-06-30 | Discharge: 2018-06-30 | Disposition: A | Discharge status: Home / Self Care | Payer: Medicare Other | Source: Ambulatory Visit | Attending: Radiation Oncology | Admitting: Radiation Oncology

## 2018-06-30 ENCOUNTER — Encounter: Payer: Self-pay | Admitting: Internal Medicine

## 2018-06-30 ENCOUNTER — Telehealth: Payer: Self-pay

## 2018-06-30 ENCOUNTER — Ambulatory Visit: Payer: Medicare Other | Admitting: Oncology

## 2018-06-30 ENCOUNTER — Encounter: Payer: Self-pay | Admitting: Oncology

## 2018-06-30 ENCOUNTER — Other Ambulatory Visit: Payer: Medicare Other

## 2018-06-30 ENCOUNTER — Inpatient Hospital Stay (HOSPITAL_BASED_OUTPATIENT_CLINIC_OR_DEPARTMENT_OTHER): Payer: Medicare Other | Admitting: Oncology

## 2018-06-30 ENCOUNTER — Inpatient Hospital Stay: Payer: Medicare Other

## 2018-06-30 VITALS — BP 146/66 | HR 94 | Temp 98.1°F | Resp 18 | Ht 63.0 in | Wt 130.0 lb

## 2018-06-30 DIAGNOSIS — Z923 Personal history of irradiation: Secondary | ICD-10-CM | POA: Diagnosis not present

## 2018-06-30 DIAGNOSIS — C3491 Malignant neoplasm of unspecified part of right bronchus or lung: Secondary | ICD-10-CM

## 2018-06-30 DIAGNOSIS — Z7982 Long term (current) use of aspirin: Secondary | ICD-10-CM

## 2018-06-30 DIAGNOSIS — Z888 Allergy status to other drugs, medicaments and biological substances status: Secondary | ICD-10-CM

## 2018-06-30 DIAGNOSIS — R5383 Other fatigue: Secondary | ICD-10-CM

## 2018-06-30 DIAGNOSIS — C3411 Malignant neoplasm of upper lobe, right bronchus or lung: Secondary | ICD-10-CM | POA: Diagnosis not present

## 2018-06-30 DIAGNOSIS — E1140 Type 2 diabetes mellitus with diabetic neuropathy, unspecified: Secondary | ICD-10-CM

## 2018-06-30 DIAGNOSIS — Z5111 Encounter for antineoplastic chemotherapy: Secondary | ICD-10-CM

## 2018-06-30 DIAGNOSIS — R634 Abnormal weight loss: Secondary | ICD-10-CM | POA: Diagnosis not present

## 2018-06-30 DIAGNOSIS — Z9049 Acquired absence of other specified parts of digestive tract: Secondary | ICD-10-CM | POA: Diagnosis not present

## 2018-06-30 DIAGNOSIS — E785 Hyperlipidemia, unspecified: Secondary | ICD-10-CM

## 2018-06-30 DIAGNOSIS — I10 Essential (primary) hypertension: Secondary | ICD-10-CM | POA: Diagnosis not present

## 2018-06-30 DIAGNOSIS — Z79899 Other long term (current) drug therapy: Secondary | ICD-10-CM | POA: Diagnosis not present

## 2018-06-30 DIAGNOSIS — Z8673 Personal history of transient ischemic attack (TIA), and cerebral infarction without residual deficits: Secondary | ICD-10-CM | POA: Diagnosis not present

## 2018-06-30 DIAGNOSIS — Z7984 Long term (current) use of oral hypoglycemic drugs: Secondary | ICD-10-CM | POA: Diagnosis not present

## 2018-06-30 DIAGNOSIS — K219 Gastro-esophageal reflux disease without esophagitis: Secondary | ICD-10-CM

## 2018-06-30 DIAGNOSIS — Z510 Encounter for antineoplastic radiation therapy: Secondary | ICD-10-CM | POA: Diagnosis not present

## 2018-06-30 LAB — CBC WITH DIFFERENTIAL (CANCER CENTER ONLY)
Abs Immature Granulocytes: 0.05 10*3/uL (ref 0.00–0.07)
BASOS ABS: 0 10*3/uL (ref 0.0–0.1)
Basophils Relative: 0 %
Eosinophils Absolute: 0.9 10*3/uL — ABNORMAL HIGH (ref 0.0–0.5)
Eosinophils Relative: 7 %
HCT: 29.2 % — ABNORMAL LOW (ref 36.0–46.0)
Hemoglobin: 9 g/dL — ABNORMAL LOW (ref 12.0–15.0)
Immature Granulocytes: 0 %
Lymphocytes Relative: 10 %
Lymphs Abs: 1.2 10*3/uL (ref 0.7–4.0)
MCH: 25.9 pg — ABNORMAL LOW (ref 26.0–34.0)
MCHC: 30.8 g/dL (ref 30.0–36.0)
MCV: 84.1 fL (ref 80.0–100.0)
Monocytes Absolute: 0.9 10*3/uL (ref 0.1–1.0)
Monocytes Relative: 8 %
NRBC: 0 % (ref 0.0–0.2)
Neutro Abs: 9.3 10*3/uL — ABNORMAL HIGH (ref 1.7–7.7)
Neutrophils Relative %: 75 %
Platelet Count: 730 10*3/uL — ABNORMAL HIGH (ref 150–400)
RBC: 3.47 MIL/uL — ABNORMAL LOW (ref 3.87–5.11)
RDW: 16.9 % — ABNORMAL HIGH (ref 11.5–15.5)
WBC Count: 12.5 10*3/uL — ABNORMAL HIGH (ref 4.0–10.5)

## 2018-06-30 LAB — CMP (CANCER CENTER ONLY)
ALT: 8 U/L (ref 0–44)
AST: 11 U/L — ABNORMAL LOW (ref 15–41)
Albumin: 2.8 g/dL — ABNORMAL LOW (ref 3.5–5.0)
Alkaline Phosphatase: 69 U/L (ref 38–126)
Anion gap: 10 (ref 5–15)
BUN: 12 mg/dL (ref 8–23)
CHLORIDE: 101 mmol/L (ref 98–111)
CO2: 29 mmol/L (ref 22–32)
Calcium: 10.1 mg/dL (ref 8.9–10.3)
Creatinine: 0.79 mg/dL (ref 0.44–1.00)
GFR, Est AFR Am: >60 mL/min (ref >60)
GFR, Estimated: >60 mL/min (ref >60)
Glucose, Bld: 197 mg/dL — ABNORMAL HIGH (ref 70–99)
Potassium: 4 mmol/L (ref 3.5–5.1)
Sodium: 140 mmol/L (ref 135–145)
Total Bilirubin: 0.5 mg/dL (ref 0.3–1.2)
Total Protein: 6.5 g/dL (ref 6.5–8.1)

## 2018-06-30 NOTE — Telephone Encounter (Signed)
Printed avs and calender of upcoming appointment. Per 12/16 los

## 2018-06-30 NOTE — Progress Notes (Signed)
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 The patient presented today with concerning findings for disease recurrence and progression with enlarging and hypermetabolic right upper lobe Walker mass presenting as stage IIIa (T3, N0, M0).  PRIOR THERAPY: Curative stereotactic radiotherapy completed on June 21, 2017.  CURRENT THERAPY: A course of concurrent chemoradiation with chemotherapy consisting of carboplatin for an AUC of 2 and paclitaxel 45 mg/m.  First dose started on 06/23/2018.  Status post 1 cycle.  INTERVAL HISTORY: [name] 77 y.o. female returns for routine follow-up visit accompanied by her daughter.  The patient is feeling fine today and has no specific complaints except for mild fatigue.  She experienced increased agitation, restlessness, and hallucinations with her first cycle of chemotherapy.  Symptoms were thought to be related to premedications of either Benadryl or dexamethasone or the combination of the two.  The patient's daughter reports that she pulled out her IV and was very agitated with staff.  Discontinued that evening with improvement of her hallucinations by the next morning however, she remained agitated and off balance for the day following chemotherapy.  Her symptoms are now all completely resolved.  The patient does not really remember some of this but does remember seeing people in her home that were not there.  She denies fevers and chills.  Denies chest pain, shortness of breath, cough, hemoptysis.  Denies odynophagia.  Denies nausea, vomiting, constipation, diarrhea.  She reports a good appetite but has lost weight since her last visit.  The patient is here for  evaluation prior to cycle #2 of her chemotherapy.  MEDICAL HISTORY: Past Medical History:  Diagnosis Date  . Cancer ([place])    Walker   . Carotid artery occlusion   . Clostridium difficile infection 06/2013  . Diabetes mellitus    takes Janumet daily  . Dyslipidemia    takes Crestor daily  . Gallstones   . GERD (gastroesophageal reflux disease)   . Heart murmur    hx of  . History of radiation therapy 06/11/17-06/21/17   right Walker 50 Gy in 5 fractions  . Hypertension    takes Prinzide and Verapamil daily  . Impaired speech    from stroke  . Neuropathy, diabetic ([place])   . Pneumonia   . PONV (postoperative nausea and vomiting)   . Seizures ([place]) 04/19/2018   recent hospitalization  . Smoker   . Stroke ([place]) 06/25/13  . Vertigo    but doesn't take any meds    ALLERGIES:  is allergic to codeine; lipitor [atorvastatin]; and phenergan [promethazine hcl].  MEDICATIONS:  Current Outpatient Medications  Medication Sig Dispense Refill  . ALPRAZolam (XANAX) 0.25 MG tablet Take 1 tablet (0.25 mg total) by mouth every 8 (eight) hours as needed for anxiety (1-2 tablets). 6 tablet 0  . aspirin EC 81 MG tablet Take 81 mg by mouth daily.    . Blood Glucose Monitoring Suppl (ONE TOUCH ULTRA 2) w/Device KIT 1 Device by Does not apply route 2 (two) times daily. 1 each 0  . clopidogrel (PLAVIX) 75 MG tablet TAKE 1 TABLET BY MOUTH  DAILY (Patient taking differently: Take 75 mg by mouth daily. ) 90 tablet 3  . divalproex (DEPAKOTE ER) 500 MG 24 hr tablet Take 1  tablet (500 mg total) by mouth daily. 30 tablet 3  . glucose blood (ONE TOUCH ULTRA TEST) test strip Use as instructed 100 each 12  . Lancets (ONETOUCH ULTRASOFT) lancets Use as instructed 100 each 12  . lisinopril (PRINIVIL,ZESTRIL) 10 MG tablet Take 1 tablet (10 mg total) by mouth daily. 90 tablet 3  . Multiple Vitamins-Minerals (MULTIVITAMIN PO) Take 1 tablet by mouth daily.    . prochlorperazine (COMPAZINE) 10 MG tablet Take 1 tablet  (10 mg total) by mouth every 6 (six) hours as needed for nausea or vomiting. 30 tablet 0  . rosuvastatin (CRESTOR) 20 MG tablet TAKE 1 TABLET BY MOUTH ONCE DAILY (Patient taking differently: Take 20 mg by mouth daily. ) 180 tablet 0  . sitaGLIPtin-metformin (JANUMET) 50-1000 MG tablet Take 1 tablet by mouth 2 (two) times daily. (Patient taking differently: Take 1 tablet by mouth 2 (two) times daily before a meal. ) 120 tablet 0  . vitamin B-12 (CYANOCOBALAMIN) 1000 MCG tablet Take 1,000 mcg by mouth daily.    Marland Kitchen lisinopril-hydrochlorothiazide (PRINZIDE,ZESTORETIC) 20-12.5 MG tablet Take 1 tablet by mouth daily.     No current facility-administered medications for this visit.     SURGICAL HISTORY:  Past Surgical History:  Procedure Laterality Date  . cataract removed Right   . CHOLECYSTECTOMY    . COLONOSCOPY    . ENDARTERECTOMY Left 07/14/2013   Procedure: ENDARTERECTOMY CAROTID-LEFT;  Surgeon: Elam Dutch, MD;  Location: [facility];  Service: Vascular;  Laterality: Left;  . EYE SURGERY Left    cataract  . IR PERC PLEURAL DRAIN W/INDWELL CATH W/IMG GUIDE  05/23/2017  . KNEE SURGERY Left 13yr ago  . LAPAROSCOPIC PARTIAL COLECTOMY N/A 09/02/2015   Procedure: LAPAROSCOPIC PARTIAL RIGHT COLECTOMY;  Surgeon: ALeighton Ruff MD;  Location: [facility];  Service: General;  Laterality: N/A;  . PATCH ANGIOPLASTY Left 07/14/2013   Procedure: LEFT CAROTID ARTERY PATCH ANGIOPLASTY;  Surgeon: CElam Dutch MD;  [facility]  Service: Vascular;  Laterality: Left;  .Marland KitchenVIDEO BRONCHOSCOPY N/A 06/04/2018   Procedure: VIDEO BRONCHOSCOPY;  Surgeon: HMelrose Nakayama MD;  [facility];  Service: Thoracic;  Laterality: N/A;  . wisdom      REVIEW OF SYSTEMS:   Review of Systems  Constitutional: Negative for appetite change, chills, fever and unexpected weight change.  Positive for mild fatigue. HENT:   Negative for mouth sores, nosebleeds, sore throat and trouble swallowing.   Eyes: Negative for  eye problems and icterus.  Respiratory: Negative for cough, hemoptysis, shortness of breath and wheezing.   Cardiovascular: Negative for chest pain and leg swelling.  Gastrointestinal: Negative for abdominal pain, constipation, diarrhea, nausea and vomiting.  Genitourinary: Negative for bladder incontinence, difficulty urinating, dysuria, frequency and hematuria.   Musculoskeletal: Negative for back pain, gait problem, neck pain and neck stiffness.  Skin: Negative for itching and rash.  Neurological: Negative for dizziness, extremity weakness, gait problem, headaches, light-headedness and seizures.  Hematological: Negative for adenopathy. Does not bruise/bleed easily.  Psychiatric/Behavioral: Negative for confusion, depression and sleep disturbance. The patient is not nervous/anxious.     PHYSICAL EXAMINATION:  Blood pressure (!) 146/66, pulse 94, temperature 98.1 F (36.7 C), temperature source Oral, resp. rate 18, height '5\' 3"'  (1.6 m), weight 130 lb (59 kg), SpO2 97 %.  ECOG PERFORMANCE STATUS: 1 - Symptomatic but completely ambulatory  Physical Exam  Constitutional: Oriented to person, place, and time and well-developed, well-nourished, and in no distress. No distress.  HENT:  Head:  Normocephalic and atraumatic.  Mouth/Throat: Oropharynx is clear and moist. No oropharyngeal exudate.  Eyes: Conjunctivae are normal. Right eye exhibits no discharge. Left eye exhibits no discharge. No scleral icterus.  Neck: Normal range of motion. Neck supple.  Cardiovascular: Normal rate, regular rhythm, normal heart sounds and intact distal pulses.   Pulmonary/Chest: Effort normal and breath sounds normal. No respiratory distress. No wheezes. No rales.  Abdominal: Soft. Bowel sounds are normal. Exhibits no distension and no mass. There is no tenderness.  Musculoskeletal: Normal range of motion. Exhibits no edema.  Lymphadenopathy:    No cervical adenopathy.  Neurological: Alert and oriented to  person, place, and time. Exhibits normal muscle tone. Gait normal. Coordination normal.  Skin: Skin is warm and dry. No rash noted. Not diaphoretic. No erythema. No pallor.  Psychiatric: Mood, memory and judgment normal.  Vitals reviewed.  LABORATORY DATA: Lab Results  Component Value Date   WBC 12.5 (H) 06/30/2018   HGB 9.0 (L) 06/30/2018   HCT 29.2 (L) 06/30/2018   MCV 84.1 06/30/2018   PLT 730 (H) 06/30/2018      Chemistry      Component Value Date/Time   NA 140 06/30/2018 0829   NA 139 05/07/2018 1147   K 4.0 06/30/2018 0829   CL 101 06/30/2018 0829   CO2 29 06/30/2018 0829   BUN 12 06/30/2018 0829   BUN 15 05/07/2018 1147   CREATININE 0.79 06/30/2018 0829   CREATININE 0.87 03/08/2015 0001      Component Value Date/Time   CALCIUM 10.1 06/30/2018 0829   ALKPHOS 69 06/30/2018 0829   AST 11 (L) 06/30/2018 0829   ALT 8 06/30/2018 0829   BILITOT 0.5 06/30/2018 0829       RADIOGRAPHIC STUDIES:  Dg Chest 2 View  Result Date: 06/23/2018 CLINICAL DATA:  Tachycardia.  History of non-small cell Walker cancer. EXAM: CHEST - 2 VIEW COMPARISON:  Chest radiograph June 02, 2018 and PET-CT May 07, 2018 FINDINGS: Redemonstration of RIGHT upper lobe mass, slightly more consolidated appearance. RIGHT Walker volume loss with similar blunting the RIGHT costophrenic angle. Mild bronchitic changes. Cardiac silhouette is normal. Calcified aortic arch. No pneumothorax. Surgical clips in the included right abdomen compatible with cholecystectomy. Osseous structures are nonacute. IMPRESSION: 1. RIGHT upper lobe mass. 2. Mild bronchitic changes. 3. Aortic Atherosclerosis (ICD10-I70.0). Electronically Signed   By: Elon Alas M.D.   On: 06/23/2018 14:15   Dg Chest 2 View  Result Date: 06/03/2018 CLINICAL DATA:  Preoperative study prior to video bronchoscopy tomorrow. Right upper lobe Walker mass. EXAM: CHEST - 2 VIEW COMPARISON:  Chest x-ray of May 04, 2018 FINDINGS: The lungs are  mildly hyperinflated with hemidiaphragm flattening. There is persistent right upper lobe soft tissue mass. It has increased slightly in conspicuity. There is a trace of pleural fluid blunting the right costophrenic angles. The left Walker is clear. The heart and pulmonary vascularity are normal. There is calcification in the wall of the aortic arch. The trachea is midline. The bony thorax exhibits no acute abnormality. IMPRESSION: COPD. Persistent right upper lobe mass which is slightly more conspicuous today. Trace right pleural effusion new since the previous study. Thoracic aortic atherosclerosis. Electronically Signed   By: David  Martinique M.D.   On: 06/03/2018 08:30   Mr Jeri Cos UU Contrast  Addendum Date: 06/02/2018   Upon further review of patient's imaging films dating back to January 2015 the left frontal lesion at the most was 2 cm at that time but on  the present scan appears to be slightly larger 2.3 cm in maximum AP dimension this would be unusual for infarct which should shrink over time and the possibility of low-grade glioma or tumor seems a possibility.  Result Date: 06/02/2018  [facility] [address], [place], [place] [address] [phone] NEUROIMAGING REPORT STUDY DATE:06/01/2018 PATIENT NAME: PATRICK SOHM DOB: 12-09-1940 MRN: [id number] ORDERING CLINICIAN: Dr Leonie Man CLINICAL HISTORY:  28 year patient with seizures and stroke COMPARISON FILMS: MRI Brain 04/20/2018 EXAM: MRI Brain w/wo TECHNIQUE: MRI of the brain with and without contrast was obtained utilizing 5 mm axial slices with T1, T2, T2 flair, T2 star gradient echo and diffusion weighted views.  T1 sagittal, T2 coronal and postcontrast views in the axial and coronal plane were obtained. CONTRAST:  Iv multihance IMAGING SITE: [facility] FINDINGS: The brain parenchyma shows area of encephalomalacia in the left posterior frontal region  likely compatible with remote age infarct however the lesion  measures 2.3 cm in maximum AP dimension which actually is larger than the original described size of 10 x 20 mm on the scan in December 2014.  There is no surrounding enhancement or compensated dilatation of the lateral ventricle making it unusual..   Low-grade glioma could be possible though less likely.  This does not show any postcontrast enhancement.  There are mild degree of changes of age-appropriate chronic microvascular ischemia and generalized cerebral atrophy.  The paranasal sinuses show mild chronic inflammatory changes.  No other structural lesion, tumor or infarcts are noted.No abnormal lesions are seen on diffusion-weighted views to suggest acute ischemia. The cortical sulci, fissures and cisterns are normal in size and appearance. Lateral, third and fourth ventricle are normal in size and appearance. No extra-axial fluid collections are seen. No evidence of mass effect or midline shift.  No abnormal lesions are seen on post contrast views.  On sagittal views the posterior fossa, pituitary gland and corpus callosum are unremarkable. No evidence of intracranial hemorrhage on gradient-echo views. The orbits and their contents, paranasal sinuses and calvarium are unremarkable.  Intracranial flow voids are present.   Abnormal MRI scan of the brain showing persistent area of encephalomalacia and gliosis in the left frontal region possibly remote age left frontal   infarct with slight or typical features and low-grade glioma is a consideration though less likely.  As well as mild changes of chronic microvascular ischemia and generalized cerebral atrophy.  No acute abnormalities are noted.  Postcontrast images do not result in abnormal areas of enhancement.  Overall no significant change compared with previous MRI dated 04/20/2018 INTERPRETING PHYSICIAN: PRAMOD SETHI, MD Certified in  Neuroimaging by [employer] and [employer]     ASSESSMENT/PLAN:   Stage III squamous cell carcinoma of right Walker [facility]) This is a very pleasant 77 year old white female with recurrent non-small cell Walker cancer likely squamous cell carcinoma that was initially diagnosed as a stage IIA(T2b, N0, M0) inAugust 2018status post curative stereotactic radiotherapy completed June 21, 2017. The patient presented with concerning findings for disease recurrence and progression with enlarging and hypermetabolic right upper lobe Walker mass presenting as stage IIIa (T3, N0, M0).    Biopsy of the right upper lobe Walker lesion which was consistent with non-small cell Walker cancer, squamous cell.  PDL 1 is pending.  She is currently undergoing a course of concurrent chemoradiation with chemotherapy consisting of carboplatin for an AUC of 2 and paclitaxel 45 mg meter squared.  She tolerated the first cycle well overall but did experience agitation, restlessness, and hallucinations likely due to her premedications.  I had a lengthy discussion with the patient and her daughter today regarding adjusting premedications to reduce the risk of restlessness, agitation, and hallucinations.  We will decrease the Benadryl to 25 mg and decrease the dexamethasone to 10 mg.  The patient is scheduled to have a sitter with her in the infusion area.  The patient and her daughter are in agreement to this plan.  The patient will follow-up in 2 weeks for evaluation prior to cycle #4.  For the weight loss, I referred her to the dietitian.  The patient was advised to call immediately if she has any concerning symptoms in the interval.  The patient voices understanding of current disease status and treatment options and is in agreement with the current care plan.  All questions were answered. The patient knows to call the clinic with any problems, questions or concerns. We can certainly see the patient much sooner if necessary.   Orders Placed This Encounter  Procedures  . Amb Referral to Nutrition  and Diabetic E    Referral Priority:   Routine    Referral Type:   Consultation    Referral Reason:   Specialty Services Required    Number of Visits Requested:   [place], DNP, AGPCNP-BC, AOCNP 06/30/18

## 2018-06-30 NOTE — Assessment & Plan Note (Addendum)
This is a very pleasant 77 year old white female with recurrent non-small cell lung cancer likely squamous cell carcinoma that was initially diagnosed as a stage IIA(T2b, N0, M0) inAugust 2018status post curative stereotactic radiotherapy completed June 21, 2017. The patient presented with concerning findings for disease recurrence and progression with enlarging and hypermetabolic right upper lobe lung mass presenting as stage IIIa (T3, N0, M0).    Biopsy of the right upper lobe lung lesion which was consistent with non-small cell lung cancer, squamous cell.  PDL 1 is pending.  She is currently undergoing a course of concurrent chemoradiation with chemotherapy consisting of carboplatin for an AUC of 2 and paclitaxel 45 mg meter squared.  She tolerated the first cycle well overall but did experience agitation, restlessness, and hallucinations likely due to her premedications.  I had a lengthy discussion with the patient and her daughter today regarding adjusting premedications to reduce the risk of restlessness, agitation, and hallucinations.  We will decrease the Benadryl to 25 mg and decrease the dexamethasone to 10 mg.  The patient is scheduled to have a sitter with her in the infusion area.  The patient and her daughter are in agreement to this plan.  The patient will follow-up in 2 weeks for evaluation prior to cycle #4.  For the weight loss, I referred her to the dietitian.  The patient was advised to call immediately if she has any concerning symptoms in the interval.  The patient voices understanding of current disease status and treatment options and is in agreement with the current care plan.  All questions were answered. The patient knows to call the clinic with any problems, questions or concerns. We can certainly see the patient much sooner if necessary.

## 2018-06-30 NOTE — Progress Notes (Signed)
Met with patient and daughter to introduce myself as Financial Resource Specialist and to offer available resources. ° °Discussed the one-time $700 [facility] grant and qualifications. Advised what is needed to apply. They verbalized understanding. ° °Discussed the [employer] whom assist patients whom live in [place]. Gave application if interested and advised they may take to foundation or bring to me along with supporting documents to email to them. They verbalized understanding. ° °Gave my card for any additional financial questions or concerns. °

## 2018-07-01 ENCOUNTER — Inpatient Hospital Stay: Payer: Medicare Other

## 2018-07-01 ENCOUNTER — Ambulatory Visit
Admission: RE | Admit: 2018-07-01 | Discharge: 2018-07-01 | Disposition: A | Discharge status: Home / Self Care | Payer: Medicare Other | Source: Ambulatory Visit | Attending: Radiation Oncology | Admitting: Radiation Oncology

## 2018-07-01 VITALS — BP 155/60 | HR 97 | Temp 98.5°F | Resp 18

## 2018-07-01 DIAGNOSIS — Z79899 Other long term (current) drug therapy: Secondary | ICD-10-CM | POA: Diagnosis not present

## 2018-07-01 DIAGNOSIS — Z7982 Long term (current) use of aspirin: Secondary | ICD-10-CM | POA: Diagnosis not present

## 2018-07-01 DIAGNOSIS — Z888 Allergy status to other drugs, medicaments and biological substances status: Secondary | ICD-10-CM | POA: Diagnosis not present

## 2018-07-01 DIAGNOSIS — K219 Gastro-esophageal reflux disease without esophagitis: Secondary | ICD-10-CM | POA: Diagnosis not present

## 2018-07-01 DIAGNOSIS — C3411 Malignant neoplasm of upper lobe, right bronchus or lung: Secondary | ICD-10-CM | POA: Diagnosis not present

## 2018-07-01 DIAGNOSIS — E785 Hyperlipidemia, unspecified: Secondary | ICD-10-CM | POA: Diagnosis not present

## 2018-07-01 DIAGNOSIS — Z7984 Long term (current) use of oral hypoglycemic drugs: Secondary | ICD-10-CM | POA: Diagnosis not present

## 2018-07-01 DIAGNOSIS — Z8673 Personal history of transient ischemic attack (TIA), and cerebral infarction without residual deficits: Secondary | ICD-10-CM | POA: Diagnosis not present

## 2018-07-01 DIAGNOSIS — Z5111 Encounter for antineoplastic chemotherapy: Secondary | ICD-10-CM | POA: Diagnosis not present

## 2018-07-01 DIAGNOSIS — R5383 Other fatigue: Secondary | ICD-10-CM | POA: Diagnosis not present

## 2018-07-01 DIAGNOSIS — Z9049 Acquired absence of other specified parts of digestive tract: Secondary | ICD-10-CM | POA: Diagnosis not present

## 2018-07-01 DIAGNOSIS — E1140 Type 2 diabetes mellitus with diabetic neuropathy, unspecified: Secondary | ICD-10-CM | POA: Diagnosis not present

## 2018-07-01 DIAGNOSIS — C3491 Malignant neoplasm of unspecified part of right bronchus or lung: Secondary | ICD-10-CM

## 2018-07-01 DIAGNOSIS — Z923 Personal history of irradiation: Secondary | ICD-10-CM | POA: Diagnosis not present

## 2018-07-01 DIAGNOSIS — Z510 Encounter for antineoplastic radiation therapy: Secondary | ICD-10-CM | POA: Diagnosis not present

## 2018-07-01 DIAGNOSIS — I10 Essential (primary) hypertension: Secondary | ICD-10-CM | POA: Diagnosis not present

## 2018-07-01 MED ORDER — SODIUM CHLORIDE 0.9 % IV SOLN
45.0000 mg/m2 | Freq: Once | INTRAVENOUS | Status: AC
  Administered 2018-07-01: 72 mg via INTRAVENOUS
  Filled 2018-07-01: qty 12

## 2018-07-01 MED ORDER — FAMOTIDINE IN NACL 20-0.9 MG/50ML-% IV SOLN
INTRAVENOUS | Status: AC
  Filled 2018-07-01: qty 50

## 2018-07-01 MED ORDER — DEXAMETHASONE SODIUM PHOSPHATE 10 MG/ML IJ SOLN
INTRAMUSCULAR | Status: AC
  Filled 2018-07-01: qty 1

## 2018-07-01 MED ORDER — DIPHENHYDRAMINE HCL 50 MG/ML IJ SOLN
INTRAMUSCULAR | Status: AC
  Filled 2018-07-01: qty 1

## 2018-07-01 MED ORDER — PALONOSETRON HCL INJECTION 0.25 MG/5ML
0.2500 mg | Freq: Once | INTRAVENOUS | Status: AC
  Administered 2018-07-01: 0.25 mg via INTRAVENOUS

## 2018-07-01 MED ORDER — SODIUM CHLORIDE 0.9 % IV SOLN: 10.0000 mg | Freq: Once | INTRAVENOUS | Status: DC

## 2018-07-01 MED ORDER — DEXAMETHASONE SODIUM PHOSPHATE 10 MG/ML IJ SOLN
10.0000 mg | Freq: Once | INTRAMUSCULAR | Status: AC
  Administered 2018-07-01: 10 mg via INTRAVENOUS

## 2018-07-01 MED ORDER — SODIUM CHLORIDE 0.9 % IV SOLN
143.2000 mg | Freq: Once | INTRAVENOUS | Status: AC
  Administered 2018-07-01: 140 mg via INTRAVENOUS
  Filled 2018-07-01: qty 14

## 2018-07-01 MED ORDER — PALONOSETRON HCL INJECTION 0.25 MG/5ML
INTRAVENOUS | Status: AC
  Filled 2018-07-01: qty 5

## 2018-07-01 MED ORDER — DIPHENHYDRAMINE HCL 50 MG/ML IJ SOLN
25.0000 mg | Freq: Once | INTRAMUSCULAR | Status: AC
  Administered 2018-07-01: 25 mg via INTRAVENOUS

## 2018-07-01 MED ORDER — SODIUM CHLORIDE 0.9 % IV SOLN
Freq: Once | INTRAVENOUS | Status: AC
  Administered 2018-07-01: 09:00:00 via INTRAVENOUS
  Filled 2018-07-01: qty 250

## 2018-07-01 MED ORDER — FAMOTIDINE IN NACL 20-0.9 MG/50ML-% IV SOLN
20.0000 mg | Freq: Once | INTRAVENOUS | Status: AC
  Administered 2018-07-01: 20 mg via INTRAVENOUS

## 2018-07-01 NOTE — Progress Notes (Signed)
Nutrition Assessment   Reason for Assessment:   Referral for weight loss   ASSESSMENT:   77 year old female with recurrent non small cell lung cancer followed by Dr. Earlie Server.  Patient receiving chemotherapy and radiation therapy.  Past medical history of DM, Dyslipidemia, GERD, stroke, HTN, smoker.    Met with patient and daughter during infusion.  Sitter at chairside due to reaction last treatment.  Patient reports usually eats egg and toast for breakfast with bacon or sausage, sometimes cereal.  Lunch is sometimes soup.  Patient having hard time remembering foods eaten.  Supper daughter usually prepares meal of spaghetti, meat, vegetables and starch.  Ate hot dog last night for dinner.  Does not like ensure/boost/carnation instant breakfast.     Medications: MVI, compazine, januvamet, Vit B12   Labs: glucose 197, a1c 7.7   Anthropometrics:   Height: 63 inches Weight: 130 lb (12/16) UBW: 137 lb noted on 12/3.  October 133 lb BMI: 23  5% weight loss in the last 2 weeks   Estimated Energy Needs  Kcals: 1770-2000 calories Protein: 89-100 g Fluid: 2 L/d   NUTRITION DIAGNOSIS: Inadequate oral intake related to cancer and cancer related treatment side effects as evidenced by 5% weight loss in the last 2 weeks   INTERVENTION:  Discussed strategies to increase calories and protein.  Handout provided. Encouraged small frequent meals Discussed good sources of protein as patient does not eat much meat.   Needs reinforcement in education due to medication side effects.  Daughter present during visit.  Contact information given to patient   MONITORING, EVALUATION, GOAL: Patient will consume adequate calories and protein to prevent further weight loss   Next Visit: Jan 13 during infusion (Monday)  Naim Murtha B. Zenia Resides, [place], [place] Registered Dietitian [phone] (pager)

## 2018-07-01 NOTE — Patient Instructions (Signed)
[facility] Discharge Instructions for Patients Receiving Chemotherapy  Today you received the following chemotherapy agents: Paclitaxel (Taxol) and Carboplatin (Paraplatin)  To help prevent nausea and vomiting after your treatment, we encourage you to take your nausea medication as directed.    If you develop nausea and vomiting that is not controlled by your nausea medication, call the clinic.   BELOW ARE SYMPTOMS THAT SHOULD BE REPORTED IMMEDIATELY:  *FEVER GREATER THAN 100.5 F  *CHILLS WITH OR WITHOUT FEVER  NAUSEA AND VOMITING THAT IS NOT CONTROLLED WITH YOUR NAUSEA MEDICATION  *UNUSUAL SHORTNESS OF BREATH  *UNUSUAL BRUISING OR BLEEDING  TENDERNESS IN MOUTH AND THROAT WITH OR WITHOUT PRESENCE OF ULCERS  *URINARY PROBLEMS  *BOWEL PROBLEMS  UNUSUAL RASH Items with * indicate a potential emergency and should be followed up as soon as possible.  Feel free to call the clinic should you have any questions or concerns. The clinic phone number is [phone].  Please show the [facility] at check-in to the Emergency Department and triage nurse.

## 2018-07-02 ENCOUNTER — Ambulatory Visit
Admission: RE | Admit: 2018-07-02 | Discharge: 2018-07-02 | Disposition: A | Discharge status: Home / Self Care | Payer: Medicare Other | Source: Ambulatory Visit | Attending: Radiation Oncology | Admitting: Radiation Oncology

## 2018-07-02 DIAGNOSIS — C3411 Malignant neoplasm of upper lobe, right bronchus or lung: Secondary | ICD-10-CM | POA: Diagnosis not present

## 2018-07-02 DIAGNOSIS — Z510 Encounter for antineoplastic radiation therapy: Secondary | ICD-10-CM | POA: Diagnosis not present

## 2018-07-02 DIAGNOSIS — Z923 Personal history of irradiation: Secondary | ICD-10-CM | POA: Diagnosis not present

## 2018-07-03 ENCOUNTER — Other Ambulatory Visit: Payer: Medicare Other

## 2018-07-03 ENCOUNTER — Ambulatory Visit
Admission: RE | Admit: 2018-07-03 | Discharge: 2018-07-03 | Disposition: A | Discharge status: Home / Self Care | Payer: Medicare Other | Source: Ambulatory Visit | Attending: Radiation Oncology | Admitting: Radiation Oncology

## 2018-07-03 ENCOUNTER — Telehealth: Payer: Self-pay

## 2018-07-03 ENCOUNTER — Ambulatory Visit: Payer: Medicare Other | Admitting: Neurology

## 2018-07-03 DIAGNOSIS — Z510 Encounter for antineoplastic radiation therapy: Secondary | ICD-10-CM | POA: Diagnosis not present

## 2018-07-03 DIAGNOSIS — C3411 Malignant neoplasm of upper lobe, right bronchus or lung: Secondary | ICD-10-CM | POA: Diagnosis not present

## 2018-07-03 DIAGNOSIS — Z923 Personal history of irradiation: Secondary | ICD-10-CM | POA: Diagnosis not present

## 2018-07-03 IMAGING — CT CT ABD-PELV W/ CM
2 of 5 series · 13 of 46 positions shown, 15 images · IV contrast (iopamidol)
Comparison: None.

CLINICAL DATA: Assess right-sided lung nodule. Staging for
malignancy. Initial encounter.

EXAM:
CT CHEST, ABDOMEN, AND PELVIS WITH CONTRAST
TECHNIQUE: Multidetector CT imaging of the chest, abdomen and pelvis was
performed following the standard protocol during bolus
administration of intravenous contrast.
CONTRAST:  75mL [id number] IOPAMIDOL ([id number]) INJECTION 61%

[Series 3: cap with · axial · 0.77mm/px · z∈[+946,+1481]mm · 10 of 131 slices shown, 12 images]
[im 12/131  soft-tissue]
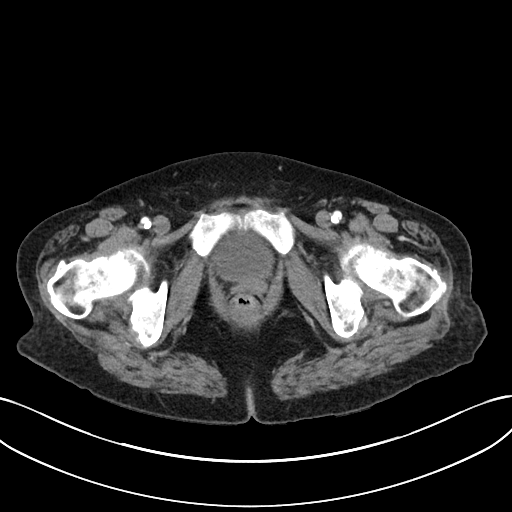
[im 12/131  bone]
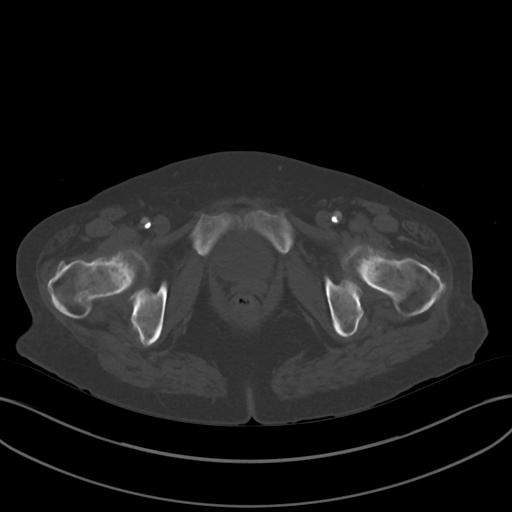
[im 24/131  soft-tissue]
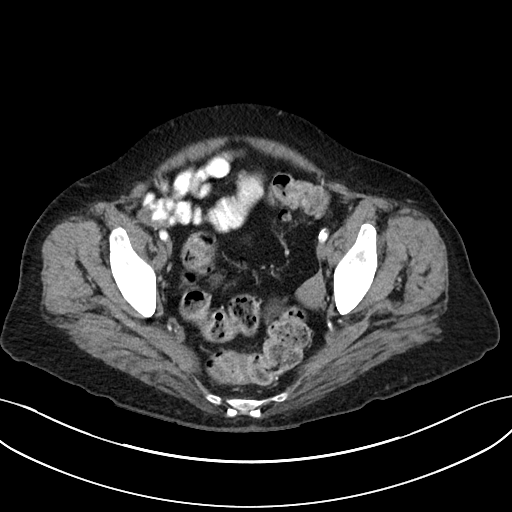
[im 36/131  soft-tissue]
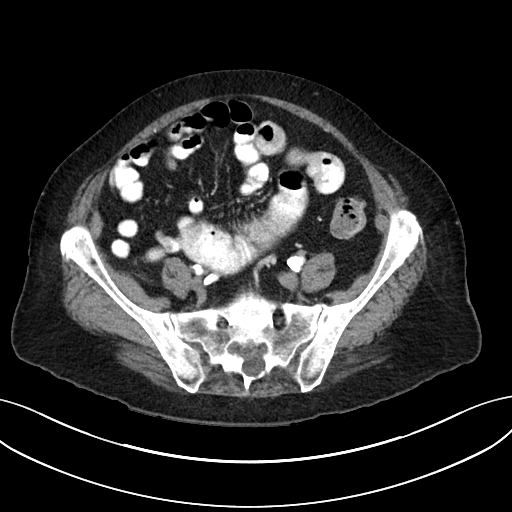
[im 48/131  soft-tissue]
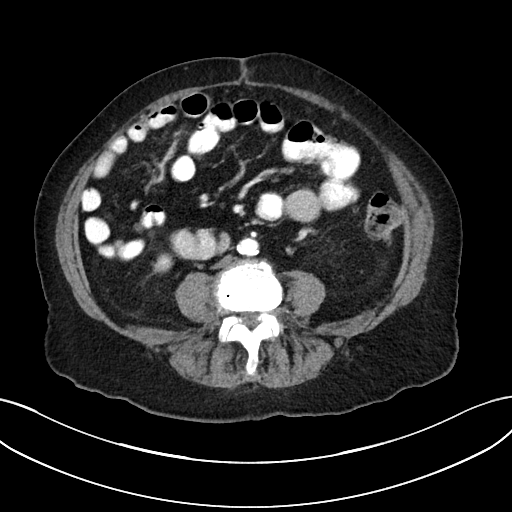
[im 60/131  soft-tissue]
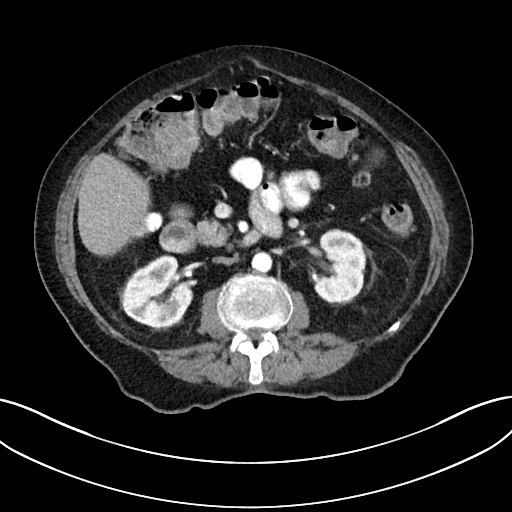
[im 71/131  soft-tissue]
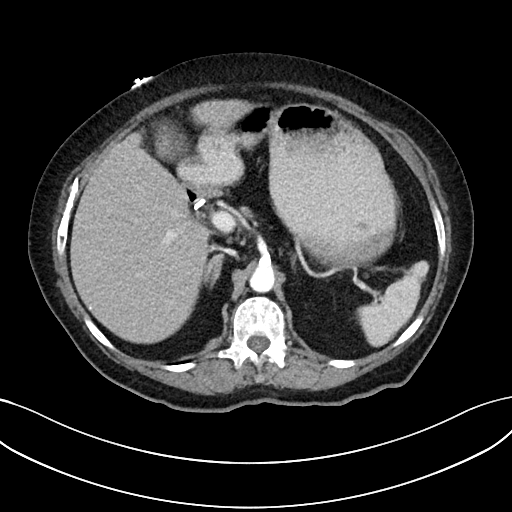
[im 83/131  soft-tissue]
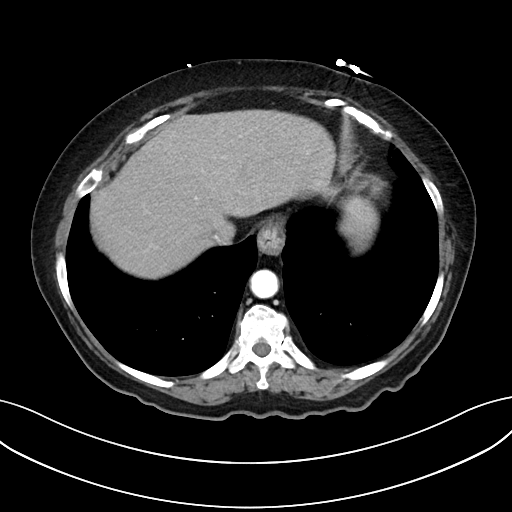
[im 95/131  soft-tissue]
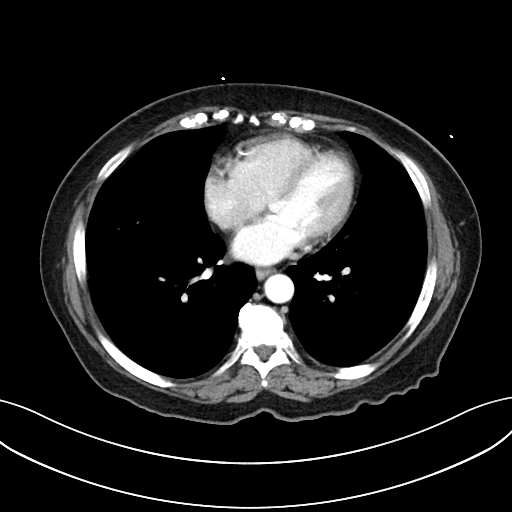
[im 107/131  soft-tissue]
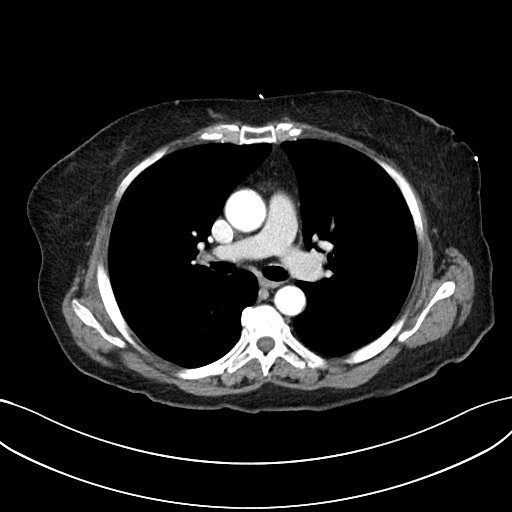
[im 107/131  bone]
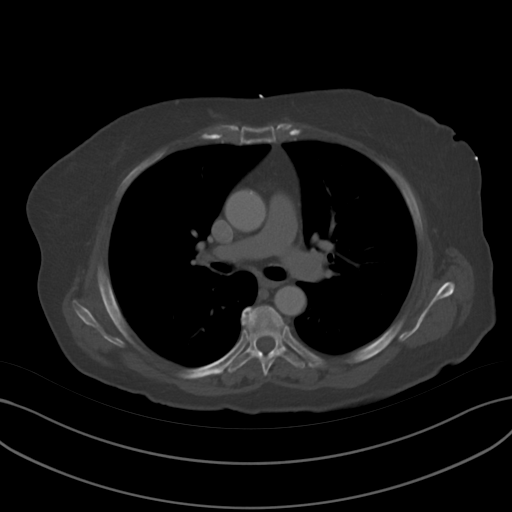
[im 119/131  soft-tissue]
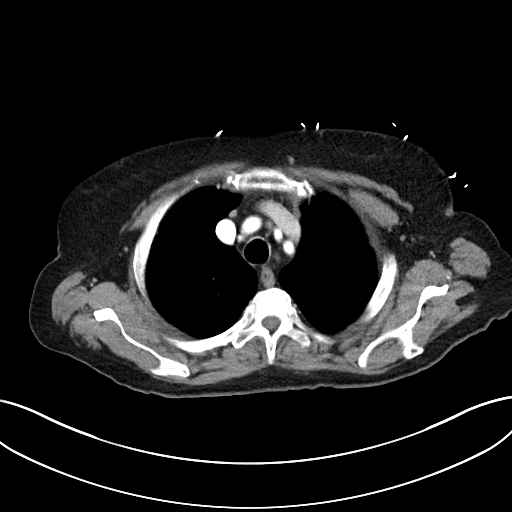

[Series 6: cor · coronal · 0.74mm/px · 3 of 94 slices shown]
[im 32/94  soft-tissue]
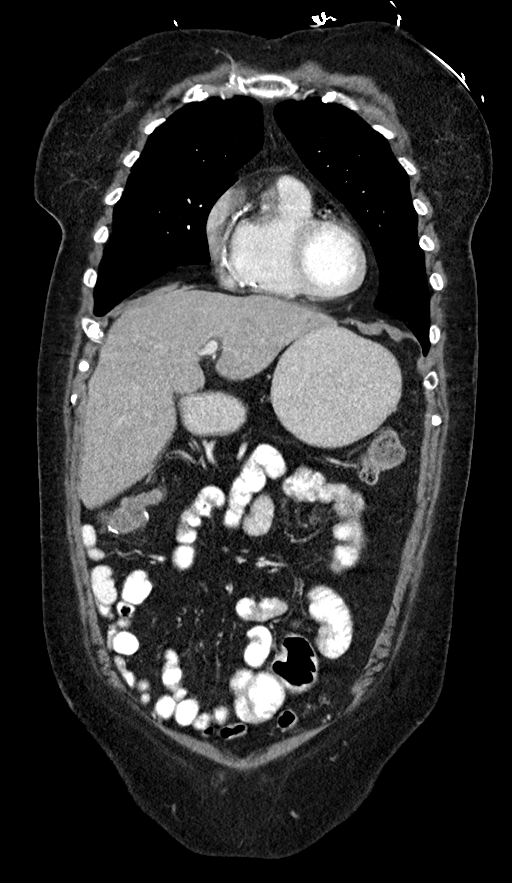
[im 42/94  soft-tissue]
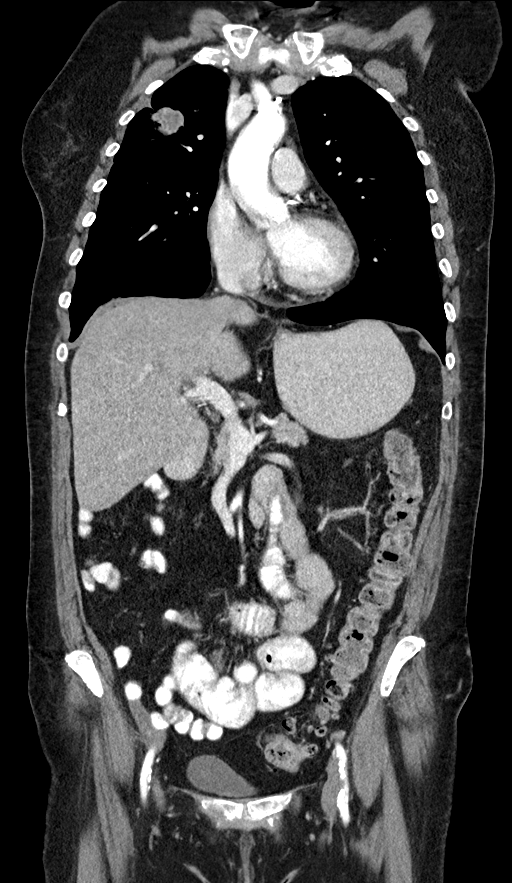
[im 52/94  soft-tissue]
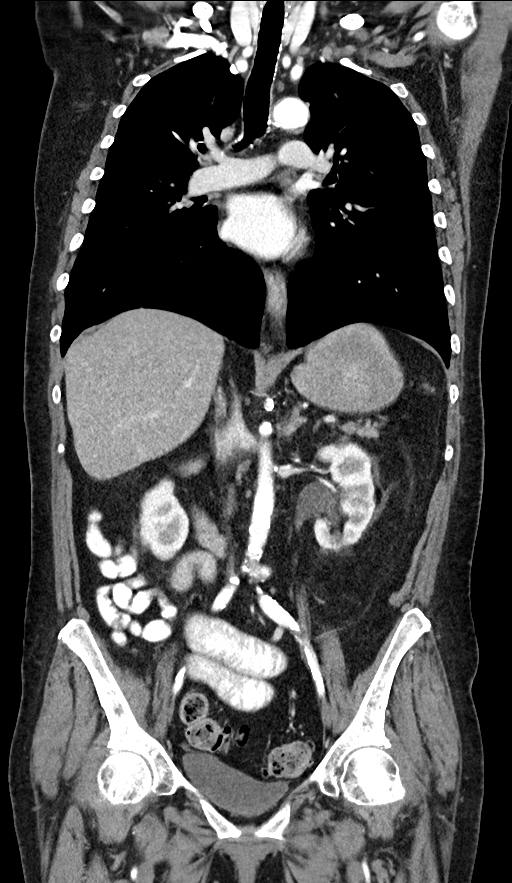

[13 of 46 positions shown; findings below may reference images not displayed]

FINDINGS: CT CHEST FINDINGS

Cardiovascular: Diffuse coronary artery calcifications are seen. The
heart remains normal in size. Scattered calcification is noted along
the aortic arch. The great vessels are unremarkable in appearance.

Mediastinum/Nodes: Visualized mediastinal nodes remain normal in
size. No pericardial effusion is identified. The visualized portions
of the thyroid gland are unremarkable. No axillary lymphadenopathy
is seen.

Lungs/Pleura: There is a multilobulated mildly spiculated mass at
the right upper lobe, measuring 3.3 x 2.7 x 3.2 cm. There appears to
be some degree of associated cavitation. This is concerning for
primary bronchogenic malignancy. No definite satellite nodules are
identified. A tiny calcified granuloma is noted at the right lung
apex. No pleural effusion or pneumothorax is identified.

Musculoskeletal: No acute osseous abnormalities are identified. The
visualized musculature is unremarkable in appearance.

CT ABDOMEN PELVIS FINDINGS

Hepatobiliary: The liver is unremarkable in appearance. The patient
is status post cholecystectomy, with clips noted at the gallbladder
fossa. The common bile duct remains normal in caliber.

Pancreas: The pancreas is within normal limits.

Spleen: A vague 1.3 cm hypodensity in the periphery of the spleen
appears to be posttraumatic in nature, given its distribution.

Adrenals/Urinary Tract: There is slight nodularity of the left
adrenal gland, without a definite mass. The adrenal glands are
otherwise grossly unremarkable in appearance.

There appears to be an enhancing 1.6 cm nodule near the upper pole
of the left kidney on sagittal images. Renal cell carcinoma cannot
be excluded, though this may reflect underlying scarring and normal
renal parenchyma. Nonspecific perinephric stranding is noted
bilaterally. Scattered bilateral renal cysts are seen. No renal or
ureteral stones are identified. There is no evidence of
hydronephrosis.

Stomach/Bowel: The patient is status post partial right-sided
hemicolectomy. The ileocolic anastomosis is grossly unremarkable in
appearance. Scattered diverticulosis is noted along the descending
and sigmoid colon, without evidence of diverticulitis.

The small bowel is unremarkable in appearance. The stomach is within
normal limits.

Vascular/Lymphatic: Scattered calcification is seen along the
abdominal aorta and its branches. The abdominal aorta is otherwise
grossly unremarkable. The inferior vena cava is grossly
unremarkable. No retroperitoneal lymphadenopathy is seen. No pelvic
sidewall lymphadenopathy is identified.

Reproductive: The bladder is mildly distended and grossly
unremarkable. The uterus is grossly unremarkable. No suspicious
adnexal masses are seen.

Other: No additional soft tissue abnormalities are seen.

Musculoskeletal: No acute osseous abnormalities are identified.
There is mild diffuse sclerosis involving visualized osseous
structures, without evidence of osseous metastasis. Vacuum
phenomenon and endplate sclerotic change are noted at L4-L5. The
visualized musculature is unremarkable in appearance.
IMPRESSION: 1. Multilobulated mildly spiculated mass at the right upper lung
lobe, measuring 3.3 x 2.7 x 3.2 cm. Associated cavitation noted.
This is concerning for primary bronchogenic malignancy. Pulmonary
consultation is recommended for tissue diagnosis, as deemed
clinically appropriate.
2. No definite evidence of metastatic disease.
3. Question of enhancing 1.6 cm nodule near the upper pole of the
left kidney on sagittal images. Though this may simply reflect
underlying scarring and normal renal parenchyma, renal cell
carcinoma cannot be excluded. MRI of the abdomen with contrast could
be considered for further evaluation.
4. Scattered aortic atherosclerosis.
5. Diffuse coronary artery calcifications noted.
6. Scattered diverticulosis along the descending and sigmoid colon,
without evidence of diverticulitis.
7. Scattered bilateral renal cysts seen.

## 2018-07-03 NOTE — Telephone Encounter (Signed)
Called and verified appointment scheduled for 12/31 was approved. Per 12/19 follow up from the Book

## 2018-07-04 ENCOUNTER — Ambulatory Visit
Admission: RE | Admit: 2018-07-04 | Discharge: 2018-07-04 | Disposition: A | Discharge status: Home / Self Care | Payer: Medicare Other | Source: Ambulatory Visit | Attending: Radiation Oncology | Admitting: Radiation Oncology

## 2018-07-04 DIAGNOSIS — C3411 Malignant neoplasm of upper lobe, right bronchus or lung: Secondary | ICD-10-CM | POA: Diagnosis not present

## 2018-07-04 DIAGNOSIS — Z510 Encounter for antineoplastic radiation therapy: Secondary | ICD-10-CM | POA: Diagnosis not present

## 2018-07-04 DIAGNOSIS — Z923 Personal history of irradiation: Secondary | ICD-10-CM | POA: Diagnosis not present

## 2018-07-07 ENCOUNTER — Inpatient Hospital Stay: Payer: Medicare Other

## 2018-07-07 ENCOUNTER — Ambulatory Visit
Admission: RE | Admit: 2018-07-07 | Discharge: 2018-07-07 | Disposition: A | Discharge status: Home / Self Care | Payer: Medicare Other | Source: Ambulatory Visit | Attending: Radiation Oncology | Admitting: Radiation Oncology

## 2018-07-07 VITALS — BP 158/71 | HR 98 | Temp 98.4°F | Resp 20 | Ht 63.0 in | Wt 131.0 lb

## 2018-07-07 DIAGNOSIS — Z510 Encounter for antineoplastic radiation therapy: Secondary | ICD-10-CM | POA: Diagnosis not present

## 2018-07-07 DIAGNOSIS — Z9049 Acquired absence of other specified parts of digestive tract: Secondary | ICD-10-CM | POA: Diagnosis not present

## 2018-07-07 DIAGNOSIS — E1140 Type 2 diabetes mellitus with diabetic neuropathy, unspecified: Secondary | ICD-10-CM | POA: Diagnosis not present

## 2018-07-07 DIAGNOSIS — I10 Essential (primary) hypertension: Secondary | ICD-10-CM | POA: Diagnosis not present

## 2018-07-07 DIAGNOSIS — Z8673 Personal history of transient ischemic attack (TIA), and cerebral infarction without residual deficits: Secondary | ICD-10-CM | POA: Diagnosis not present

## 2018-07-07 DIAGNOSIS — K219 Gastro-esophageal reflux disease without esophagitis: Secondary | ICD-10-CM | POA: Diagnosis not present

## 2018-07-07 DIAGNOSIS — C3491 Malignant neoplasm of unspecified part of right bronchus or lung: Secondary | ICD-10-CM

## 2018-07-07 DIAGNOSIS — Z7984 Long term (current) use of oral hypoglycemic drugs: Secondary | ICD-10-CM | POA: Diagnosis not present

## 2018-07-07 DIAGNOSIS — E785 Hyperlipidemia, unspecified: Secondary | ICD-10-CM | POA: Diagnosis not present

## 2018-07-07 DIAGNOSIS — C3411 Malignant neoplasm of upper lobe, right bronchus or lung: Secondary | ICD-10-CM | POA: Diagnosis not present

## 2018-07-07 DIAGNOSIS — Z79899 Other long term (current) drug therapy: Secondary | ICD-10-CM | POA: Diagnosis not present

## 2018-07-07 DIAGNOSIS — Z5111 Encounter for antineoplastic chemotherapy: Secondary | ICD-10-CM | POA: Diagnosis not present

## 2018-07-07 DIAGNOSIS — Z7982 Long term (current) use of aspirin: Secondary | ICD-10-CM | POA: Diagnosis not present

## 2018-07-07 DIAGNOSIS — Z888 Allergy status to other drugs, medicaments and biological substances status: Secondary | ICD-10-CM | POA: Diagnosis not present

## 2018-07-07 DIAGNOSIS — Z923 Personal history of irradiation: Secondary | ICD-10-CM | POA: Diagnosis not present

## 2018-07-07 DIAGNOSIS — R5383 Other fatigue: Secondary | ICD-10-CM | POA: Diagnosis not present

## 2018-07-07 LAB — CMP (CANCER CENTER ONLY)
ALT: 10 U/L (ref 0–44)
AST: 11 U/L — ABNORMAL LOW (ref 15–41)
Albumin: 2.7 g/dL — ABNORMAL LOW (ref 3.5–5.0)
Alkaline Phosphatase: 66 U/L (ref 38–126)
Anion gap: 10 (ref 5–15)
BUN: 9 mg/dL (ref 8–23)
CALCIUM: 9.1 mg/dL (ref 8.9–10.3)
CO2: 27 mmol/L (ref 22–32)
CREATININE: 0.77 mg/dL (ref 0.44–1.00)
Chloride: 104 mmol/L (ref 98–111)
GFR, Est AFR Am: >60 mL/min (ref >60)
GFR, Estimated: >60 mL/min (ref >60)
Glucose, Bld: 192 mg/dL — ABNORMAL HIGH (ref 70–99)
Potassium: 3.6 mmol/L (ref 3.5–5.1)
Sodium: 141 mmol/L (ref 135–145)
Total Bilirubin: 0.3 mg/dL (ref 0.3–1.2)
Total Protein: 6.2 g/dL — ABNORMAL LOW (ref 6.5–8.1)

## 2018-07-07 LAB — CBC WITH DIFFERENTIAL (CANCER CENTER ONLY)
Abs Immature Granulocytes: 0.04 10*3/uL (ref 0.00–0.07)
Basophils Absolute: 0 10*3/uL (ref 0.0–0.1)
Basophils Relative: 0 %
EOS ABS: 0.3 10*3/uL (ref 0.0–0.5)
EOS PCT: 3 %
HEMATOCRIT: 27.3 % — AB (ref 36.0–46.0)
Hemoglobin: 8.6 g/dL — ABNORMAL LOW (ref 12.0–15.0)
Immature Granulocytes: 0 %
Lymphocytes Relative: 9 %
Lymphs Abs: 0.9 10*3/uL (ref 0.7–4.0)
MCH: 26.1 pg (ref 26.0–34.0)
MCHC: 31.5 g/dL (ref 30.0–36.0)
MCV: 82.7 fL (ref 80.0–100.0)
MONO ABS: 0.6 10*3/uL (ref 0.1–1.0)
Monocytes Relative: 6 %
Neutro Abs: 8.2 10*3/uL — ABNORMAL HIGH (ref 1.7–7.7)
Neutrophils Relative %: 82 %
Platelet Count: 688 10*3/uL — ABNORMAL HIGH (ref 150–400)
RBC: 3.3 MIL/uL — ABNORMAL LOW (ref 3.87–5.11)
RDW: 17.2 % — ABNORMAL HIGH (ref 11.5–15.5)
WBC Count: 10.1 10*3/uL (ref 4.0–10.5)
nRBC: 0 % (ref 0.0–0.2)

## 2018-07-07 MED ORDER — SODIUM CHLORIDE 0.9 % IV SOLN
45.0000 mg/m2 | Freq: Once | INTRAVENOUS | Status: AC
  Administered 2018-07-07: 72 mg via INTRAVENOUS
  Filled 2018-07-07: qty 12

## 2018-07-07 MED ORDER — FAMOTIDINE IN NACL 20-0.9 MG/50ML-% IV SOLN
INTRAVENOUS | Status: AC
  Filled 2018-07-07: qty 50

## 2018-07-07 MED ORDER — SODIUM CHLORIDE 0.9 % IV SOLN
143.2000 mg | Freq: Once | INTRAVENOUS | Status: AC
  Administered 2018-07-07: 140 mg via INTRAVENOUS
  Filled 2018-07-07: qty 14

## 2018-07-07 MED ORDER — DIPHENHYDRAMINE HCL 25 MG PO CAPS
ORAL_CAPSULE | ORAL | Status: AC
  Filled 2018-07-07: qty 1

## 2018-07-07 MED ORDER — FAMOTIDINE IN NACL 20-0.9 MG/50ML-% IV SOLN
20.0000 mg | Freq: Once | INTRAVENOUS | Status: AC
  Administered 2018-07-07: 20 mg via INTRAVENOUS

## 2018-07-07 MED ORDER — DEXAMETHASONE SODIUM PHOSPHATE 10 MG/ML IJ SOLN
10.0000 mg | Freq: Once | INTRAMUSCULAR | Status: AC
  Administered 2018-07-07: 10 mg via INTRAVENOUS

## 2018-07-07 MED ORDER — DIPHENHYDRAMINE HCL 25 MG PO CAPS
25.0000 mg | ORAL_CAPSULE | Freq: Once | ORAL | Status: AC
  Administered 2018-07-07: 25 mg via ORAL

## 2018-07-07 MED ORDER — DEXAMETHASONE SODIUM PHOSPHATE 10 MG/ML IJ SOLN
INTRAMUSCULAR | Status: AC
  Filled 2018-07-07: qty 1

## 2018-07-07 MED ORDER — PALONOSETRON HCL INJECTION 0.25 MG/5ML
INTRAVENOUS | Status: AC
  Filled 2018-07-07: qty 5

## 2018-07-07 MED ORDER — SODIUM CHLORIDE 0.9 % IV SOLN
Freq: Once | INTRAVENOUS | Status: AC
  Administered 2018-07-07: 09:00:00 via INTRAVENOUS
  Filled 2018-07-07: qty 250

## 2018-07-07 MED ORDER — PALONOSETRON HCL INJECTION 0.25 MG/5ML
0.2500 mg | Freq: Once | INTRAVENOUS | Status: AC
  Administered 2018-07-07: 0.25 mg via INTRAVENOUS

## 2018-07-07 NOTE — Patient Instructions (Signed)
[facility] Discharge Instructions for Patients Receiving Chemotherapy  Today you received the following chemotherapy agents Paclitaxel (TAXOL) & Carboplatin (PARAPLATIN).  To help prevent nausea and vomiting after your treatment, we encourage you to take your nausea medication as prescribed.  If you develop nausea and vomiting that is not controlled by your nausea medication, call the clinic.   BELOW ARE SYMPTOMS THAT SHOULD BE REPORTED IMMEDIATELY:  *FEVER GREATER THAN 100.5 F  *CHILLS WITH OR WITHOUT FEVER  NAUSEA AND VOMITING THAT IS NOT CONTROLLED WITH YOUR NAUSEA MEDICATION  *UNUSUAL SHORTNESS OF BREATH  *UNUSUAL BRUISING OR BLEEDING  TENDERNESS IN MOUTH AND THROAT WITH OR WITHOUT PRESENCE OF ULCERS  *URINARY PROBLEMS  *BOWEL PROBLEMS  UNUSUAL RASH Items with * indicate a potential emergency and should be followed up as soon as possible.  Feel free to call the clinic should you have any questions or concerns. The clinic phone number is [phone].  Please show the CHEMO ALERT CARD at check-in to the Emergency Department and triage nurse.   

## 2018-07-08 ENCOUNTER — Ambulatory Visit
Admission: RE | Admit: 2018-07-08 | Discharge: 2018-07-08 | Disposition: A | Discharge status: Home / Self Care | Payer: Medicare Other | Source: Ambulatory Visit | Attending: Radiation Oncology | Admitting: Radiation Oncology

## 2018-07-08 DIAGNOSIS — C3411 Malignant neoplasm of upper lobe, right bronchus or lung: Secondary | ICD-10-CM | POA: Diagnosis not present

## 2018-07-08 DIAGNOSIS — Z510 Encounter for antineoplastic radiation therapy: Secondary | ICD-10-CM | POA: Diagnosis not present

## 2018-07-08 DIAGNOSIS — Z923 Personal history of irradiation: Secondary | ICD-10-CM | POA: Diagnosis not present

## 2018-07-10 ENCOUNTER — Other Ambulatory Visit: Payer: Self-pay

## 2018-07-10 ENCOUNTER — Ambulatory Visit
Admission: RE | Admit: 2018-07-10 | Discharge: 2018-07-10 | Disposition: A | Discharge status: Home / Self Care | Payer: Medicare Other | Source: Ambulatory Visit | Attending: Radiation Oncology | Admitting: Radiation Oncology

## 2018-07-10 DIAGNOSIS — Z923 Personal history of irradiation: Secondary | ICD-10-CM | POA: Diagnosis not present

## 2018-07-10 DIAGNOSIS — C3411 Malignant neoplasm of upper lobe, right bronchus or lung: Secondary | ICD-10-CM | POA: Diagnosis not present

## 2018-07-10 DIAGNOSIS — Z510 Encounter for antineoplastic radiation therapy: Secondary | ICD-10-CM | POA: Diagnosis not present

## 2018-07-10 NOTE — Patient Outreach (Signed)
[facility]) Care Management  07/10/2018  [name] 07-Jul-1941 [id number]    Successful outreach with [name]. She reported episodes of fatigue but otherwise feeling well. Reported tolerating radiation treatments and weekly chemotherapy infusions.  No complaints of shortness of breath or chest discomfort. Denied fevers. Tolerating small meals without nausea or vomiting. Reported ambulating well in the home and performing ADLs. Denied falls.  Discussed treatment plan and care management needs. [name] denied urgent concerns and reported that her daughter [name] is available in the home to assist as needed.  Stated that she has several scheduled chemotherapy cycles and her needs may change. Agreeable to follow up discussion with [name] within the next few weeks.    [place] [phone]

## 2018-07-11 ENCOUNTER — Ambulatory Visit
Admission: RE | Admit: 2018-07-11 | Discharge: 2018-07-11 | Disposition: A | Discharge status: Home / Self Care | Payer: Medicare Other | Source: Ambulatory Visit | Attending: Radiation Oncology | Admitting: Radiation Oncology

## 2018-07-11 ENCOUNTER — Ambulatory Visit: Payer: Self-pay

## 2018-07-11 DIAGNOSIS — Z510 Encounter for antineoplastic radiation therapy: Secondary | ICD-10-CM | POA: Diagnosis not present

## 2018-07-11 DIAGNOSIS — Z923 Personal history of irradiation: Secondary | ICD-10-CM | POA: Diagnosis not present

## 2018-07-11 DIAGNOSIS — C3411 Malignant neoplasm of upper lobe, right bronchus or lung: Secondary | ICD-10-CM | POA: Diagnosis not present

## 2018-07-12 ENCOUNTER — Telehealth: Payer: Self-pay | Admitting: Family Medicine

## 2018-07-12 NOTE — Telephone Encounter (Signed)
Pt stopped by office for samples of Janumet 50-1000mg , ok per Dr. Redmond School, before she left she states she only takes 1 a day & that is what she been taking?   I advised her chart says 2 a day.  Please confirm her correct dosage and I will call her and let her know which she should be taking?

## 2018-07-13 NOTE — Telephone Encounter (Signed)
Since he is just taking 1 a day, we will leave her current dosing for now

## 2018-07-14 ENCOUNTER — Encounter: Payer: Self-pay | Admitting: Internal Medicine

## 2018-07-14 ENCOUNTER — Inpatient Hospital Stay: Payer: Medicare Other

## 2018-07-14 ENCOUNTER — Inpatient Hospital Stay (HOSPITAL_BASED_OUTPATIENT_CLINIC_OR_DEPARTMENT_OTHER): Payer: Medicare Other | Admitting: Internal Medicine

## 2018-07-14 ENCOUNTER — Ambulatory Visit
Admission: RE | Admit: 2018-07-14 | Discharge: 2018-07-14 | Disposition: A | Discharge status: Home / Self Care | Payer: Medicare Other | Source: Ambulatory Visit | Attending: Radiation Oncology | Admitting: Radiation Oncology

## 2018-07-14 VITALS — BP 154/74 | HR 108 | Temp 98.1°F | Resp 18 | Ht 63.0 in | Wt 132.4 lb

## 2018-07-14 DIAGNOSIS — Z7984 Long term (current) use of oral hypoglycemic drugs: Secondary | ICD-10-CM | POA: Diagnosis not present

## 2018-07-14 DIAGNOSIS — R05 Cough: Secondary | ICD-10-CM | POA: Diagnosis not present

## 2018-07-14 DIAGNOSIS — C3491 Malignant neoplasm of unspecified part of right bronchus or lung: Secondary | ICD-10-CM

## 2018-07-14 DIAGNOSIS — Z8673 Personal history of transient ischemic attack (TIA), and cerebral infarction without residual deficits: Secondary | ICD-10-CM | POA: Diagnosis not present

## 2018-07-14 DIAGNOSIS — E785 Hyperlipidemia, unspecified: Secondary | ICD-10-CM | POA: Diagnosis not present

## 2018-07-14 DIAGNOSIS — R5383 Other fatigue: Secondary | ICD-10-CM | POA: Diagnosis not present

## 2018-07-14 DIAGNOSIS — Z5111 Encounter for antineoplastic chemotherapy: Secondary | ICD-10-CM

## 2018-07-14 DIAGNOSIS — Z7982 Long term (current) use of aspirin: Secondary | ICD-10-CM | POA: Diagnosis not present

## 2018-07-14 DIAGNOSIS — Z510 Encounter for antineoplastic radiation therapy: Secondary | ICD-10-CM | POA: Diagnosis not present

## 2018-07-14 DIAGNOSIS — C3411 Malignant neoplasm of upper lobe, right bronchus or lung: Secondary | ICD-10-CM

## 2018-07-14 DIAGNOSIS — Z888 Allergy status to other drugs, medicaments and biological substances status: Secondary | ICD-10-CM | POA: Diagnosis not present

## 2018-07-14 DIAGNOSIS — Z923 Personal history of irradiation: Secondary | ICD-10-CM | POA: Diagnosis not present

## 2018-07-14 DIAGNOSIS — Z9049 Acquired absence of other specified parts of digestive tract: Secondary | ICD-10-CM | POA: Diagnosis not present

## 2018-07-14 DIAGNOSIS — Z79899 Other long term (current) drug therapy: Secondary | ICD-10-CM | POA: Diagnosis not present

## 2018-07-14 DIAGNOSIS — E1140 Type 2 diabetes mellitus with diabetic neuropathy, unspecified: Secondary | ICD-10-CM | POA: Diagnosis not present

## 2018-07-14 DIAGNOSIS — K219 Gastro-esophageal reflux disease without esophagitis: Secondary | ICD-10-CM | POA: Diagnosis not present

## 2018-07-14 DIAGNOSIS — I10 Essential (primary) hypertension: Secondary | ICD-10-CM | POA: Diagnosis not present

## 2018-07-14 LAB — CBC WITH DIFFERENTIAL (CANCER CENTER ONLY)
Abs Immature Granulocytes: 0.03 10*3/uL (ref 0.00–0.07)
Basophils Absolute: 0 10*3/uL (ref 0.0–0.1)
Basophils Relative: 0 %
EOS PCT: 3 %
Eosinophils Absolute: 0.2 10*3/uL (ref 0.0–0.5)
HCT: 27.1 % — ABNORMAL LOW (ref 36.0–46.0)
Hemoglobin: 8.4 g/dL — ABNORMAL LOW (ref 12.0–15.0)
Immature Granulocytes: 1 %
Lymphocytes Relative: 19 %
Lymphs Abs: 1.1 10*3/uL (ref 0.7–4.0)
MCH: 26.5 pg (ref 26.0–34.0)
MCHC: 31 g/dL (ref 30.0–36.0)
MCV: 85.5 fL (ref 80.0–100.0)
Monocytes Absolute: 0.6 10*3/uL (ref 0.1–1.0)
Monocytes Relative: 11 %
Neutro Abs: 4.2 10*3/uL (ref 1.7–7.7)
Neutrophils Relative %: 66 %
PLATELETS: 486 10*3/uL — AB (ref 150–400)
RBC: 3.17 MIL/uL — ABNORMAL LOW (ref 3.87–5.11)
RDW: 18.6 % — ABNORMAL HIGH (ref 11.5–15.5)
WBC: 6.1 10*3/uL (ref 4.0–10.5)
nRBC: 0 % (ref 0.0–0.2)

## 2018-07-14 LAB — CMP (CANCER CENTER ONLY)
ALT: 8 U/L (ref 0–44)
ANION GAP: 9 (ref 5–15)
AST: 11 U/L — ABNORMAL LOW (ref 15–41)
Albumin: 2.7 g/dL — ABNORMAL LOW (ref 3.5–5.0)
Alkaline Phosphatase: 64 U/L (ref 38–126)
BUN: 10 mg/dL (ref 8–23)
CO2: 27 mmol/L (ref 22–32)
Calcium: 8.8 mg/dL — ABNORMAL LOW (ref 8.9–10.3)
Chloride: 105 mmol/L (ref 98–111)
Creatinine: 0.82 mg/dL (ref 0.44–1.00)
GFR, Est AFR Am: >60 mL/min (ref >60)
GFR, Estimated: >60 mL/min (ref >60)
Glucose, Bld: 159 mg/dL — ABNORMAL HIGH (ref 70–99)
Potassium: 3.7 mmol/L (ref 3.5–5.1)
Sodium: 141 mmol/L (ref 135–145)
Total Bilirubin: 0.2 mg/dL — ABNORMAL LOW (ref 0.3–1.2)
Total Protein: 6 g/dL — ABNORMAL LOW (ref 6.5–8.1)

## 2018-07-14 NOTE — Progress Notes (Signed)
Between [phone]   [phone]  OFFICE PROGRESS NOTE  [name], MD [place] [address]  DIAGNOSIS:Recurrent non-small cell lung cancer likely squamous cell carcinoma that was initially diagnosed as a stage IIA(T2b, N0, M0) inAugust 2018status post curative stereotactic radiotherapy completed June 21, 2017. The patient presented today with concerning findings for disease recurrence and progression with enlarging and hypermetabolic right upper lobe lung mass presenting as stage IIIa (T3, N0, M0).  PRIOR THERAPY:Curative stereotactic radiotherapy completed on June 21, 2017.  CURRENT THERAPY:A course of concurrent chemoradiation with chemotherapy consisting of carboplatin for an AUC of 2 and paclitaxel 45 mg/m.  First dose started on 06/23/2018.  Status post 3 cycles.  INTERVAL HISTORY: [name] 77 y.o. female returns to the clinic today for follow-up visit accompanied by her 2 daughters.  The patient is feeling fine today with no concerning complaints except for dry cough.  She is using cough drops with no significant improvement.  She denied having any chest pain but has shortness of breath with exertion with no hemoptysis.  She denied having any weight loss or night sweats.  She has no nausea, vomiting, diarrhea or constipation.  She has no fever or chills.  She is here today for evaluation before starting cycle #4 of her treatment.  MEDICAL HISTORY: Past Medical History:  Diagnosis Date  . Cancer ([place])    Lung   . Carotid artery occlusion   . Clostridium difficile infection 06/2013  . Diabetes mellitus    takes Janumet daily  . Dyslipidemia    takes Crestor daily  . Gallstones   . GERD (gastroesophageal reflux disease)   . Heart murmur    hx of  . History of radiation therapy 06/11/17-06/21/17   right lung 50 Gy in 5 fractions  . Hypertension    takes Prinzide and Verapamil daily    . Impaired speech    from stroke  . Neuropathy, diabetic ([place])   . Pneumonia   . PONV (postoperative nausea and vomiting)   . Seizures ([place]) 04/19/2018   recent hospitalization  . Smoker   . Stroke ([place]) 06/25/13  . Vertigo    but doesn't take any meds    ALLERGIES:  is allergic to codeine; lipitor [atorvastatin]; and phenergan [promethazine hcl].  MEDICATIONS:  Current Outpatient Medications  Medication Sig Dispense Refill  . ALPRAZolam (XANAX) 0.25 MG tablet Take 1 tablet (0.25 mg total) by mouth every 8 (eight) hours as needed for anxiety (1-2 tablets). 6 tablet 0  . aspirin EC 81 MG tablet Take 81 mg by mouth daily.    . Blood Glucose Monitoring Suppl (ONE TOUCH ULTRA 2) w/Device KIT 1 Device by Does not apply route 2 (two) times daily. 1 each 0  . clopidogrel (PLAVIX) 75 MG tablet TAKE 1 TABLET BY MOUTH  DAILY (Patient taking differently: Take 75 mg by mouth daily. ) 90 tablet 3  . divalproex (DEPAKOTE ER) 500 MG 24 hr tablet Take 1 tablet (500 mg total) by mouth daily. 30 tablet 3  . glucose blood (ONE TOUCH ULTRA TEST) test strip Use as instructed 100 each 12  . Lancets (ONETOUCH ULTRASOFT) lancets Use as instructed 100 each 12  . lisinopril (PRINIVIL,ZESTRIL) 10 MG tablet Take 1 tablet (10 mg total) by mouth daily. 90 tablet 3  . lisinopril-hydrochlorothiazide (PRINZIDE,ZESTORETIC) 20-12.5 MG tablet Take 1 tablet by mouth daily.    . Multiple Vitamins-Minerals (MULTIVITAMIN PO) Take  1 tablet by mouth daily.    . prochlorperazine (COMPAZINE) 10 MG tablet Take 1 tablet (10 mg total) by mouth every 6 (six) hours as needed for nausea or vomiting. 30 tablet 0  . rosuvastatin (CRESTOR) 20 MG tablet TAKE 1 TABLET BY MOUTH ONCE DAILY (Patient taking differently: Take 20 mg by mouth daily. ) 180 tablet 0  . sitaGLIPtin-metformin (JANUMET) 50-1000 MG tablet Take 1 tablet by mouth 2 (two) times daily. (Patient taking differently: Take 1 tablet by mouth 2 (two) times daily before a meal.  ) 120 tablet 0  . vitamin B-12 (CYANOCOBALAMIN) 1000 MCG tablet Take 1,000 mcg by mouth daily.     No current facility-administered medications for this visit.     SURGICAL HISTORY:  Past Surgical History:  Procedure Laterality Date  . cataract removed Right   . CHOLECYSTECTOMY    . COLONOSCOPY    . ENDARTERECTOMY Left 07/14/2013   Procedure: ENDARTERECTOMY CAROTID-LEFT;  Surgeon: Elam Dutch, MD;  Location: [facility];  Service: Vascular;  Laterality: Left;  . EYE SURGERY Left    cataract  . IR PERC PLEURAL DRAIN W/INDWELL CATH W/IMG GUIDE  05/23/2017  . KNEE SURGERY Left 47yr ago  . LAPAROSCOPIC PARTIAL COLECTOMY N/A 09/02/2015   Procedure: LAPAROSCOPIC PARTIAL RIGHT COLECTOMY;  Surgeon: ALeighton Ruff MD;  Location: [facility];  Service: General;  Laterality: N/A;  . PATCH ANGIOPLASTY Left 07/14/2013   Procedure: LEFT CAROTID ARTERY PATCH ANGIOPLASTY;  Surgeon: CElam Dutch MD;  [place] [facility]  Service: Vascular;  Laterality: Left;  .Marland KitchenVIDEO BRONCHOSCOPY N/A 06/04/2018   Procedure: VIDEO BRONCHOSCOPY;  Surgeon: HMelrose Nakayama MD;  [facility];  Service: Thoracic;  Laterality: N/A;  . wisdom      REVIEW OF SYSTEMS:  A comprehensive review of systems was negative except for: Respiratory: positive for cough and dyspnea on exertion   PHYSICAL EXAMINATION: General appearance: alert, cooperative and no distress Head: Normocephalic, without obvious abnormality, atraumatic Neck: no adenopathy, no JVD, supple, symmetrical, trachea midline and thyroid not enlarged, symmetric, no tenderness/mass/nodules Lymph nodes: Cervical, supraclavicular, and axillary nodes normal. Resp: clear to auscultation bilaterally Back: symmetric, no curvature. ROM normal. No CVA tenderness. Cardio: regular rate and rhythm, S1, S2 normal, no murmur, click, rub or gallop GI: soft, non-tender; bowel sounds normal; no masses,  no organomegaly Extremities: extremities normal, atraumatic, no cyanosis  or edema  ECOG PERFORMANCE STATUS: 1 - Symptomatic but completely ambulatory  Blood pressure (!) 154/74, pulse (!) 108, temperature 98.1 F (36.7 C), temperature source Oral, resp. rate 18, height '5\' 3"'  (1.6 m), weight 132 lb 6.4 oz (60.1 kg), SpO2 99 %.  LABORATORY DATA: Lab Results  Component Value Date   WBC 6.1 07/14/2018   HGB 8.4 (L) 07/14/2018   HCT 27.1 (L) 07/14/2018   MCV 85.5 07/14/2018   PLT 486 (H) 07/14/2018      Chemistry      Component Value Date/Time   NA 141 07/07/2018 0802   NA 139 05/07/2018 1147   K 3.6 07/07/2018 0802   CL 104 07/07/2018 0802   CO2 27 07/07/2018 0802   BUN 9 07/07/2018 0802   BUN 15 05/07/2018 1147   CREATININE 0.77 07/07/2018 0802   CREATININE 0.87 03/08/2015 0001      Component Value Date/Time   CALCIUM 9.1 07/07/2018 0802   ALKPHOS 66 07/07/2018 0802   AST 11 (L) 07/07/2018 0802   ALT 10 07/07/2018 0802   BILITOT 0.3 07/07/2018 0802  RADIOGRAPHIC STUDIES: Dg Chest 2 View  Result Date: 06/23/2018 CLINICAL DATA:  Tachycardia.  History of non-small cell lung cancer. EXAM: CHEST - 2 VIEW COMPARISON:  Chest radiograph June 02, 2018 and PET-CT May 07, 2018 FINDINGS: Redemonstration of RIGHT upper lobe mass, slightly more consolidated appearance. RIGHT lung volume loss with similar blunting the RIGHT costophrenic angle. Mild bronchitic changes. Cardiac silhouette is normal. Calcified aortic arch. No pneumothorax. Surgical clips in the included right abdomen compatible with cholecystectomy. Osseous structures are nonacute. IMPRESSION: 1. RIGHT upper lobe mass. 2. Mild bronchitic changes. 3. Aortic Atherosclerosis (ICD10-I70.0). Electronically Signed   By: Elon Alas M.D.   On: 06/23/2018 14:15    ASSESSMENT AND PLAN: This is a very pleasant 77 years old white female with recurrent non-small cell lung cancer presented as a stage IIIa involving the right lower lobe. The patient is currently undergoing a course of  concurrent chemoradiation with weekly carboplatin and paclitaxel status post 3 cycles.  She has been tolerating this treatment well with no concerning complaints. I recommended for her to proceed with cycle #4 today as scheduled. I will see the patient back for follow-up visit in 2 weeks for evaluation before the next cycle of her treatment. For the dry cough she will use Delsym over-the-counter as needed. The patient was advised to call immediately if she has any concerning symptoms in the interval. The patient voices understanding of current disease status and treatment options and is in agreement with the current care plan. All questions were answered. The patient knows to call the clinic with any problems, questions or concerns. We can certainly see the patient much sooner if necessary.  I spent 10 minutes counseling the patient face to face. The total time spent in the appointment was 15 minutes.  Disclaimer: This note was dictated with voice recognition software. Similar sounding words can inadvertently be transcribed and may not be corrected upon review.

## 2018-07-15 ENCOUNTER — Ambulatory Visit: Payer: Medicare Other

## 2018-07-15 ENCOUNTER — Ambulatory Visit
Admission: RE | Admit: 2018-07-15 | Discharge: 2018-07-15 | Disposition: A | Discharge status: Home / Self Care | Payer: Medicare Other | Source: Ambulatory Visit | Attending: Radiation Oncology | Admitting: Radiation Oncology

## 2018-07-15 ENCOUNTER — Telehealth: Payer: Self-pay | Admitting: Internal Medicine

## 2018-07-15 ENCOUNTER — Inpatient Hospital Stay: Payer: Medicare Other

## 2018-07-15 VITALS — BP 155/76 | HR 90 | Temp 98.0°F | Resp 18

## 2018-07-15 DIAGNOSIS — Z9049 Acquired absence of other specified parts of digestive tract: Secondary | ICD-10-CM | POA: Diagnosis not present

## 2018-07-15 DIAGNOSIS — Z510 Encounter for antineoplastic radiation therapy: Secondary | ICD-10-CM | POA: Diagnosis not present

## 2018-07-15 DIAGNOSIS — Z7984 Long term (current) use of oral hypoglycemic drugs: Secondary | ICD-10-CM | POA: Diagnosis not present

## 2018-07-15 DIAGNOSIS — C3411 Malignant neoplasm of upper lobe, right bronchus or lung: Secondary | ICD-10-CM | POA: Diagnosis not present

## 2018-07-15 DIAGNOSIS — K219 Gastro-esophageal reflux disease without esophagitis: Secondary | ICD-10-CM | POA: Diagnosis not present

## 2018-07-15 DIAGNOSIS — Z7982 Long term (current) use of aspirin: Secondary | ICD-10-CM | POA: Diagnosis not present

## 2018-07-15 DIAGNOSIS — C3491 Malignant neoplasm of unspecified part of right bronchus or lung: Secondary | ICD-10-CM

## 2018-07-15 DIAGNOSIS — Z923 Personal history of irradiation: Secondary | ICD-10-CM | POA: Diagnosis not present

## 2018-07-15 DIAGNOSIS — E785 Hyperlipidemia, unspecified: Secondary | ICD-10-CM | POA: Diagnosis not present

## 2018-07-15 DIAGNOSIS — Z5111 Encounter for antineoplastic chemotherapy: Secondary | ICD-10-CM | POA: Diagnosis not present

## 2018-07-15 DIAGNOSIS — R5383 Other fatigue: Secondary | ICD-10-CM | POA: Diagnosis not present

## 2018-07-15 DIAGNOSIS — I10 Essential (primary) hypertension: Secondary | ICD-10-CM | POA: Diagnosis not present

## 2018-07-15 DIAGNOSIS — Z888 Allergy status to other drugs, medicaments and biological substances status: Secondary | ICD-10-CM | POA: Diagnosis not present

## 2018-07-15 DIAGNOSIS — Z8673 Personal history of transient ischemic attack (TIA), and cerebral infarction without residual deficits: Secondary | ICD-10-CM | POA: Diagnosis not present

## 2018-07-15 DIAGNOSIS — E1140 Type 2 diabetes mellitus with diabetic neuropathy, unspecified: Secondary | ICD-10-CM | POA: Diagnosis not present

## 2018-07-15 DIAGNOSIS — Z79899 Other long term (current) drug therapy: Secondary | ICD-10-CM | POA: Diagnosis not present

## 2018-07-15 MED ORDER — PALONOSETRON HCL INJECTION 0.25 MG/5ML
INTRAVENOUS | Status: AC
  Filled 2018-07-15: qty 5

## 2018-07-15 MED ORDER — DIPHENHYDRAMINE HCL 25 MG PO CAPS
25.0000 mg | ORAL_CAPSULE | Freq: Once | ORAL | Status: AC
  Administered 2018-07-15: 25 mg via ORAL

## 2018-07-15 MED ORDER — SODIUM CHLORIDE 0.9 % IV SOLN
143.2000 mg | Freq: Once | INTRAVENOUS | Status: AC
  Administered 2018-07-15: 140 mg via INTRAVENOUS
  Filled 2018-07-15: qty 14

## 2018-07-15 MED ORDER — FAMOTIDINE IN NACL 20-0.9 MG/50ML-% IV SOLN
20.0000 mg | Freq: Once | INTRAVENOUS | Status: AC
  Administered 2018-07-15: 20 mg via INTRAVENOUS

## 2018-07-15 MED ORDER — DEXAMETHASONE SODIUM PHOSPHATE 10 MG/ML IJ SOLN
INTRAMUSCULAR | Status: AC
  Filled 2018-07-15: qty 1

## 2018-07-15 MED ORDER — SODIUM CHLORIDE 0.9 % IV SOLN
Freq: Once | INTRAVENOUS | Status: AC
  Administered 2018-07-15: 08:00:00 via INTRAVENOUS
  Filled 2018-07-15: qty 250

## 2018-07-15 MED ORDER — FAMOTIDINE IN NACL 20-0.9 MG/50ML-% IV SOLN
INTRAVENOUS | Status: AC
  Filled 2018-07-15: qty 50

## 2018-07-15 MED ORDER — SODIUM CHLORIDE 0.9 % IV SOLN
45.0000 mg/m2 | Freq: Once | INTRAVENOUS | Status: AC
  Administered 2018-07-15: 72 mg via INTRAVENOUS
  Filled 2018-07-15: qty 12

## 2018-07-15 MED ORDER — DIPHENHYDRAMINE HCL 25 MG PO CAPS
ORAL_CAPSULE | ORAL | Status: AC
  Filled 2018-07-15: qty 1

## 2018-07-15 MED ORDER — PALONOSETRON HCL INJECTION 0.25 MG/5ML
0.2500 mg | Freq: Once | INTRAVENOUS | Status: AC
  Administered 2018-07-15: 0.25 mg via INTRAVENOUS

## 2018-07-15 MED ORDER — DEXAMETHASONE SODIUM PHOSPHATE 10 MG/ML IJ SOLN
10.0000 mg | Freq: Once | INTRAMUSCULAR | Status: AC
  Administered 2018-07-15: 10 mg via INTRAVENOUS

## 2018-07-15 NOTE — Patient Instructions (Signed)
    [facility]cer Center Discharge Instructions for Patients Receiving Chemotherapy  Today you received the following chemotherapy agents Taxol and Carboplatin   To help prevent nausea and vomiting after your treatment, we encourage you to take your nausea medication as directed.    If you develop nausea and vomiting that is not controlled by your nausea medication, call the clinic.   BELOW ARE SYMPTOMS THAT SHOULD BE REPORTED IMMEDIATELY:  *FEVER GREATER THAN 100.5 F  *CHILLS WITH OR WITHOUT FEVER  NAUSEA AND VOMITING THAT IS NOT CONTROLLED WITH YOUR NAUSEA MEDICATION  *UNUSUAL SHORTNESS OF BREATH  *UNUSUAL BRUISING OR BLEEDING  TENDERNESS IN MOUTH AND THROAT WITH OR WITHOUT PRESENCE OF ULCERS  *URINARY PROBLEMS  *BOWEL PROBLEMS  UNUSUAL RASH Items with * indicate a potential emergency and should be followed up as soon as possible.  Feel free to call the clinic should you have any questions or concerns. The clinic phone number is [phone].  Please show the CHEMO ALERT CARD at check-in to the Emergency Department and triage nurse.   

## 2018-07-15 NOTE — Telephone Encounter (Signed)
appts already scheduled per 12/30 los.

## 2018-07-15 NOTE — Telephone Encounter (Signed)
Awaiting pt to call back to see what would be better for her cost wise . [place]

## 2018-07-17 ENCOUNTER — Ambulatory Visit: Payer: Medicare Other

## 2018-07-17 ENCOUNTER — Ambulatory Visit
Admission: RE | Admit: 2018-07-17 | Discharge: 2018-07-17 | Disposition: A | Discharge status: Home / Self Care | Payer: Medicare Other | Source: Ambulatory Visit | Attending: Radiation Oncology | Admitting: Radiation Oncology

## 2018-07-17 DIAGNOSIS — Z510 Encounter for antineoplastic radiation therapy: Secondary | ICD-10-CM | POA: Insufficient documentation

## 2018-07-17 DIAGNOSIS — C3411 Malignant neoplasm of upper lobe, right bronchus or lung: Secondary | ICD-10-CM | POA: Insufficient documentation

## 2018-07-18 ENCOUNTER — Ambulatory Visit
Admission: RE | Admit: 2018-07-18 | Discharge: 2018-07-18 | Disposition: A | Discharge status: Home / Self Care | Payer: Medicare Other | Source: Ambulatory Visit | Attending: Radiation Oncology | Admitting: Radiation Oncology

## 2018-07-18 ENCOUNTER — Ambulatory Visit: Payer: Medicare Other

## 2018-07-18 DIAGNOSIS — C3411 Malignant neoplasm of upper lobe, right bronchus or lung: Secondary | ICD-10-CM | POA: Diagnosis not present

## 2018-07-18 DIAGNOSIS — Z510 Encounter for antineoplastic radiation therapy: Secondary | ICD-10-CM | POA: Diagnosis not present

## 2018-07-18 NOTE — Telephone Encounter (Signed)
Called pt and informed dosing and she voiced understanding and she will let us know when she needs more samples

## 2018-07-21 ENCOUNTER — Inpatient Hospital Stay: Payer: Medicare Other

## 2018-07-21 ENCOUNTER — Ambulatory Visit
Admission: RE | Admit: 2018-07-21 | Discharge: 2018-07-21 | Disposition: A | Discharge status: Home / Self Care | Payer: Medicare Other | Source: Ambulatory Visit | Attending: Radiation Oncology | Admitting: Radiation Oncology

## 2018-07-21 ENCOUNTER — Inpatient Hospital Stay: Payer: Medicare Other | Attending: Internal Medicine

## 2018-07-21 ENCOUNTER — Ambulatory Visit: Payer: Medicare Other

## 2018-07-21 VITALS — BP 169/71 | HR 75 | Temp 98.2°F | Resp 16

## 2018-07-21 DIAGNOSIS — Z79899 Other long term (current) drug therapy: Secondary | ICD-10-CM | POA: Diagnosis not present

## 2018-07-21 DIAGNOSIS — R634 Abnormal weight loss: Secondary | ICD-10-CM | POA: Insufficient documentation

## 2018-07-21 DIAGNOSIS — Z7902 Long term (current) use of antithrombotics/antiplatelets: Secondary | ICD-10-CM | POA: Diagnosis not present

## 2018-07-21 DIAGNOSIS — Z5111 Encounter for antineoplastic chemotherapy: Secondary | ICD-10-CM | POA: Insufficient documentation

## 2018-07-21 DIAGNOSIS — E785 Hyperlipidemia, unspecified: Secondary | ICD-10-CM | POA: Insufficient documentation

## 2018-07-21 DIAGNOSIS — E119 Type 2 diabetes mellitus without complications: Secondary | ICD-10-CM | POA: Insufficient documentation

## 2018-07-21 DIAGNOSIS — Z8673 Personal history of transient ischemic attack (TIA), and cerebral infarction without residual deficits: Secondary | ICD-10-CM | POA: Diagnosis not present

## 2018-07-21 DIAGNOSIS — R5383 Other fatigue: Secondary | ICD-10-CM | POA: Diagnosis not present

## 2018-07-21 DIAGNOSIS — K219 Gastro-esophageal reflux disease without esophagitis: Secondary | ICD-10-CM | POA: Diagnosis not present

## 2018-07-21 DIAGNOSIS — Z7984 Long term (current) use of oral hypoglycemic drugs: Secondary | ICD-10-CM | POA: Insufficient documentation

## 2018-07-21 DIAGNOSIS — I10 Essential (primary) hypertension: Secondary | ICD-10-CM | POA: Diagnosis not present

## 2018-07-21 DIAGNOSIS — C3491 Malignant neoplasm of unspecified part of right bronchus or lung: Secondary | ICD-10-CM

## 2018-07-21 DIAGNOSIS — Z7982 Long term (current) use of aspirin: Secondary | ICD-10-CM | POA: Diagnosis not present

## 2018-07-21 DIAGNOSIS — Z510 Encounter for antineoplastic radiation therapy: Secondary | ICD-10-CM | POA: Diagnosis not present

## 2018-07-21 DIAGNOSIS — C3411 Malignant neoplasm of upper lobe, right bronchus or lung: Secondary | ICD-10-CM

## 2018-07-21 LAB — CBC WITH DIFFERENTIAL (CANCER CENTER ONLY)
Abs Immature Granulocytes: 0.03 10*3/uL (ref 0.00–0.07)
BASOS PCT: 0 %
Basophils Absolute: 0 10*3/uL (ref 0.0–0.1)
EOS ABS: 0.1 10*3/uL (ref 0.0–0.5)
Eosinophils Relative: 1 %
HCT: 29.6 % — ABNORMAL LOW (ref 36.0–46.0)
Hemoglobin: 9.5 g/dL — ABNORMAL LOW (ref 12.0–15.0)
Immature Granulocytes: 0 %
Lymphocytes Relative: 10 %
Lymphs Abs: 0.8 10*3/uL (ref 0.7–4.0)
MCH: 27.1 pg (ref 26.0–34.0)
MCHC: 32.1 g/dL (ref 30.0–36.0)
MCV: 84.6 fL (ref 80.0–100.0)
Monocytes Absolute: 0.4 10*3/uL (ref 0.1–1.0)
Monocytes Relative: 6 %
Neutro Abs: 6.5 10*3/uL (ref 1.7–7.7)
Neutrophils Relative %: 83 %
PLATELETS: 384 10*3/uL (ref 150–400)
RBC: 3.5 MIL/uL — ABNORMAL LOW (ref 3.87–5.11)
RDW: 19.5 % — AB (ref 11.5–15.5)
WBC Count: 7.8 10*3/uL (ref 4.0–10.5)
nRBC: 0 % (ref 0.0–0.2)

## 2018-07-21 LAB — CMP (CANCER CENTER ONLY)
ALT: 13 U/L (ref 0–44)
AST: 19 U/L (ref 15–41)
Albumin: 3.2 g/dL — ABNORMAL LOW (ref 3.5–5.0)
Alkaline Phosphatase: 72 U/L (ref 38–126)
Anion gap: 10 (ref 5–15)
BUN: 10 mg/dL (ref 8–23)
CO2: 25 mmol/L (ref 22–32)
Calcium: 9.2 mg/dL (ref 8.9–10.3)
Chloride: 105 mmol/L (ref 98–111)
Creatinine: 0.84 mg/dL (ref 0.44–1.00)
GFR, Estimated: >60 mL/min (ref >60)
Glucose, Bld: 187 mg/dL — ABNORMAL HIGH (ref 70–99)
Potassium: 4 mmol/L (ref 3.5–5.1)
Sodium: 140 mmol/L (ref 135–145)
Total Bilirubin: 0.5 mg/dL (ref 0.3–1.2)
Total Protein: 6.6 g/dL (ref 6.5–8.1)

## 2018-07-21 MED ORDER — DIPHENHYDRAMINE HCL 25 MG PO CAPS
25.0000 mg | ORAL_CAPSULE | Freq: Once | ORAL | Status: AC
  Administered 2018-07-21: 25 mg via ORAL

## 2018-07-21 MED ORDER — FAMOTIDINE IN NACL 20-0.9 MG/50ML-% IV SOLN
20.0000 mg | Freq: Once | INTRAVENOUS | Status: AC
  Administered 2018-07-21: 20 mg via INTRAVENOUS

## 2018-07-21 MED ORDER — SODIUM CHLORIDE 0.9 % IV SOLN
Freq: Once | INTRAVENOUS | Status: AC
  Administered 2018-07-21: 09:00:00 via INTRAVENOUS
  Filled 2018-07-21: qty 250

## 2018-07-21 MED ORDER — PALONOSETRON HCL INJECTION 0.25 MG/5ML
0.2500 mg | Freq: Once | INTRAVENOUS | Status: AC
  Administered 2018-07-21: 0.25 mg via INTRAVENOUS

## 2018-07-21 MED ORDER — DEXAMETHASONE SODIUM PHOSPHATE 10 MG/ML IJ SOLN
10.0000 mg | Freq: Once | INTRAMUSCULAR | Status: AC
  Administered 2018-07-21: 10 mg via INTRAVENOUS

## 2018-07-21 MED ORDER — DIPHENHYDRAMINE HCL 25 MG PO CAPS
ORAL_CAPSULE | ORAL | Status: AC
  Filled 2018-07-21: qty 1

## 2018-07-21 MED ORDER — SODIUM CHLORIDE 0.9 % IV SOLN
45.0000 mg/m2 | Freq: Once | INTRAVENOUS | Status: AC
  Administered 2018-07-21: 72 mg via INTRAVENOUS
  Filled 2018-07-21: qty 12

## 2018-07-21 MED ORDER — SODIUM CHLORIDE 0.9 % IV SOLN
143.2000 mg | Freq: Once | INTRAVENOUS | Status: AC
  Administered 2018-07-21: 140 mg via INTRAVENOUS
  Filled 2018-07-21: qty 14

## 2018-07-21 MED ORDER — PALONOSETRON HCL INJECTION 0.25 MG/5ML
INTRAVENOUS | Status: AC
  Filled 2018-07-21: qty 5

## 2018-07-21 MED ORDER — DEXAMETHASONE SODIUM PHOSPHATE 10 MG/ML IJ SOLN
INTRAMUSCULAR | Status: AC
  Filled 2018-07-21: qty 1

## 2018-07-21 MED ORDER — FAMOTIDINE IN NACL 20-0.9 MG/50ML-% IV SOLN
INTRAVENOUS | Status: AC
  Filled 2018-07-21: qty 50

## 2018-07-22 ENCOUNTER — Ambulatory Visit
Admission: RE | Admit: 2018-07-22 | Discharge: 2018-07-22 | Disposition: A | Discharge status: Home / Self Care | Payer: Medicare Other | Source: Ambulatory Visit | Attending: Radiation Oncology | Admitting: Radiation Oncology

## 2018-07-22 ENCOUNTER — Ambulatory Visit: Payer: Medicare Other

## 2018-07-22 DIAGNOSIS — C3411 Malignant neoplasm of upper lobe, right bronchus or lung: Secondary | ICD-10-CM | POA: Diagnosis not present

## 2018-07-22 DIAGNOSIS — Z510 Encounter for antineoplastic radiation therapy: Secondary | ICD-10-CM | POA: Diagnosis not present

## 2018-07-23 ENCOUNTER — Ambulatory Visit: Payer: Medicare Other

## 2018-07-23 ENCOUNTER — Ambulatory Visit
Admission: RE | Admit: 2018-07-23 | Discharge: 2018-07-23 | Disposition: A | Discharge status: Home / Self Care | Payer: Medicare Other | Source: Ambulatory Visit | Attending: Radiation Oncology | Admitting: Radiation Oncology

## 2018-07-23 DIAGNOSIS — Z510 Encounter for antineoplastic radiation therapy: Secondary | ICD-10-CM | POA: Diagnosis not present

## 2018-07-23 DIAGNOSIS — C3411 Malignant neoplasm of upper lobe, right bronchus or lung: Secondary | ICD-10-CM | POA: Diagnosis not present

## 2018-07-24 ENCOUNTER — Ambulatory Visit: Payer: Medicare Other

## 2018-07-24 ENCOUNTER — Ambulatory Visit
Admission: RE | Admit: 2018-07-24 | Discharge: 2018-07-24 | Disposition: A | Discharge status: Home / Self Care | Payer: Medicare Other | Source: Ambulatory Visit | Attending: Radiation Oncology | Admitting: Radiation Oncology

## 2018-07-24 DIAGNOSIS — Z510 Encounter for antineoplastic radiation therapy: Secondary | ICD-10-CM | POA: Diagnosis not present

## 2018-07-24 DIAGNOSIS — C3411 Malignant neoplasm of upper lobe, right bronchus or lung: Secondary | ICD-10-CM | POA: Diagnosis not present

## 2018-07-25 ENCOUNTER — Ambulatory Visit
Admission: RE | Admit: 2018-07-25 | Discharge: 2018-07-25 | Disposition: A | Discharge status: Home / Self Care | Payer: Medicare Other | Source: Ambulatory Visit | Attending: Radiation Oncology | Admitting: Radiation Oncology

## 2018-07-25 ENCOUNTER — Ambulatory Visit: Payer: Medicare Other

## 2018-07-25 DIAGNOSIS — Z510 Encounter for antineoplastic radiation therapy: Secondary | ICD-10-CM | POA: Diagnosis not present

## 2018-07-25 DIAGNOSIS — C3411 Malignant neoplasm of upper lobe, right bronchus or lung: Secondary | ICD-10-CM | POA: Diagnosis not present

## 2018-07-27 ENCOUNTER — Other Ambulatory Visit: Payer: Self-pay | Admitting: Family Medicine

## 2018-07-28 ENCOUNTER — Ambulatory Visit
Admission: RE | Admit: 2018-07-28 | Discharge: 2018-07-28 | Disposition: A | Discharge status: Home / Self Care | Payer: Medicare Other | Source: Ambulatory Visit | Attending: Radiation Oncology | Admitting: Radiation Oncology

## 2018-07-28 ENCOUNTER — Inpatient Hospital Stay: Payer: Medicare Other | Admitting: Nutrition

## 2018-07-28 ENCOUNTER — Ambulatory Visit: Payer: Medicare Other

## 2018-07-28 ENCOUNTER — Encounter: Payer: Self-pay | Admitting: Internal Medicine

## 2018-07-28 ENCOUNTER — Telehealth: Payer: Self-pay | Admitting: Internal Medicine

## 2018-07-28 ENCOUNTER — Inpatient Hospital Stay (HOSPITAL_BASED_OUTPATIENT_CLINIC_OR_DEPARTMENT_OTHER): Payer: Medicare Other | Admitting: Internal Medicine

## 2018-07-28 ENCOUNTER — Inpatient Hospital Stay: Payer: Medicare Other

## 2018-07-28 VITALS — BP 154/71 | HR 77 | Temp 98.2°F | Resp 20 | Ht 63.0 in | Wt 128.0 lb

## 2018-07-28 DIAGNOSIS — C3411 Malignant neoplasm of upper lobe, right bronchus or lung: Secondary | ICD-10-CM

## 2018-07-28 DIAGNOSIS — Z7984 Long term (current) use of oral hypoglycemic drugs: Secondary | ICD-10-CM | POA: Diagnosis not present

## 2018-07-28 DIAGNOSIS — C3491 Malignant neoplasm of unspecified part of right bronchus or lung: Secondary | ICD-10-CM

## 2018-07-28 DIAGNOSIS — Z8673 Personal history of transient ischemic attack (TIA), and cerebral infarction without residual deficits: Secondary | ICD-10-CM | POA: Diagnosis not present

## 2018-07-28 DIAGNOSIS — E785 Hyperlipidemia, unspecified: Secondary | ICD-10-CM

## 2018-07-28 DIAGNOSIS — R634 Abnormal weight loss: Secondary | ICD-10-CM

## 2018-07-28 DIAGNOSIS — Z7982 Long term (current) use of aspirin: Secondary | ICD-10-CM | POA: Diagnosis not present

## 2018-07-28 DIAGNOSIS — R5383 Other fatigue: Secondary | ICD-10-CM

## 2018-07-28 DIAGNOSIS — I10 Essential (primary) hypertension: Secondary | ICD-10-CM | POA: Diagnosis not present

## 2018-07-28 DIAGNOSIS — E119 Type 2 diabetes mellitus without complications: Secondary | ICD-10-CM | POA: Diagnosis not present

## 2018-07-28 DIAGNOSIS — Z510 Encounter for antineoplastic radiation therapy: Secondary | ICD-10-CM | POA: Diagnosis not present

## 2018-07-28 DIAGNOSIS — Z79899 Other long term (current) drug therapy: Secondary | ICD-10-CM

## 2018-07-28 DIAGNOSIS — C3490 Malignant neoplasm of unspecified part of unspecified bronchus or lung: Secondary | ICD-10-CM

## 2018-07-28 DIAGNOSIS — K219 Gastro-esophageal reflux disease without esophagitis: Secondary | ICD-10-CM | POA: Diagnosis not present

## 2018-07-28 DIAGNOSIS — Z7902 Long term (current) use of antithrombotics/antiplatelets: Secondary | ICD-10-CM

## 2018-07-28 DIAGNOSIS — Z5111 Encounter for antineoplastic chemotherapy: Secondary | ICD-10-CM | POA: Diagnosis not present

## 2018-07-28 LAB — CBC WITH DIFFERENTIAL (CANCER CENTER ONLY)
Abs Immature Granulocytes: 0.03 10*3/uL (ref 0.00–0.07)
Basophils Absolute: 0 10*3/uL (ref 0.0–0.1)
Basophils Relative: 1 %
EOS ABS: 0.1 10*3/uL (ref 0.0–0.5)
Eosinophils Relative: 1 %
HCT: 29.6 % — ABNORMAL LOW (ref 36.0–46.0)
Hemoglobin: 9.3 g/dL — ABNORMAL LOW (ref 12.0–15.0)
IMMATURE GRANULOCYTES: 1 %
Lymphocytes Relative: 16 %
Lymphs Abs: 1 10*3/uL (ref 0.7–4.0)
MCH: 27.3 pg (ref 26.0–34.0)
MCHC: 31.4 g/dL (ref 30.0–36.0)
MCV: 86.8 fL (ref 80.0–100.0)
Monocytes Absolute: 0.4 10*3/uL (ref 0.1–1.0)
Monocytes Relative: 6 %
NEUTROS PCT: 75 %
Neutro Abs: 4.8 10*3/uL (ref 1.7–7.7)
Platelet Count: 264 10*3/uL (ref 150–400)
RBC: 3.41 MIL/uL — ABNORMAL LOW (ref 3.87–5.11)
RDW: 20.9 % — ABNORMAL HIGH (ref 11.5–15.5)
WBC Count: 6.3 10*3/uL (ref 4.0–10.5)
nRBC: 0 % (ref 0.0–0.2)

## 2018-07-28 LAB — CMP (CANCER CENTER ONLY)
ALT: 11 U/L (ref 0–44)
AST: 14 U/L — ABNORMAL LOW (ref 15–41)
Albumin: 3.4 g/dL — ABNORMAL LOW (ref 3.5–5.0)
Alkaline Phosphatase: 74 U/L (ref 38–126)
Anion gap: 7 (ref 5–15)
BUN: 14 mg/dL (ref 8–23)
CO2: 26 mmol/L (ref 22–32)
Calcium: 9 mg/dL (ref 8.9–10.3)
Chloride: 105 mmol/L (ref 98–111)
Creatinine: 0.79 mg/dL (ref 0.44–1.00)
GFR, Est AFR Am: >60 mL/min (ref >60)
GFR, Estimated: >60 mL/min (ref >60)
Glucose, Bld: 165 mg/dL — ABNORMAL HIGH (ref 70–99)
Potassium: 3.6 mmol/L (ref 3.5–5.1)
Sodium: 138 mmol/L (ref 135–145)
Total Bilirubin: 0.5 mg/dL (ref 0.3–1.2)
Total Protein: 6.6 g/dL (ref 6.5–8.1)

## 2018-07-28 MED ORDER — SODIUM CHLORIDE 0.9 % IV SOLN
143.2000 mg | Freq: Once | INTRAVENOUS | Status: AC
  Administered 2018-07-28: 140 mg via INTRAVENOUS
  Filled 2018-07-28: qty 14

## 2018-07-28 MED ORDER — DEXAMETHASONE SODIUM PHOSPHATE 10 MG/ML IJ SOLN
10.0000 mg | Freq: Once | INTRAMUSCULAR | Status: AC
  Administered 2018-07-28: 10 mg via INTRAVENOUS

## 2018-07-28 MED ORDER — SODIUM CHLORIDE 0.9 % IV SOLN
Freq: Once | INTRAVENOUS | Status: AC
  Administered 2018-07-28: 14:00:00 via INTRAVENOUS
  Filled 2018-07-28: qty 250

## 2018-07-28 MED ORDER — FAMOTIDINE IN NACL 20-0.9 MG/50ML-% IV SOLN
INTRAVENOUS | Status: AC
  Filled 2018-07-28: qty 50

## 2018-07-28 MED ORDER — SODIUM CHLORIDE 0.9 % IV SOLN
45.0000 mg/m2 | Freq: Once | INTRAVENOUS | Status: AC
  Administered 2018-07-28: 72 mg via INTRAVENOUS
  Filled 2018-07-28: qty 12

## 2018-07-28 MED ORDER — PALONOSETRON HCL INJECTION 0.25 MG/5ML
0.2500 mg | Freq: Once | INTRAVENOUS | Status: AC
  Administered 2018-07-28: 0.25 mg via INTRAVENOUS

## 2018-07-28 MED ORDER — DIPHENHYDRAMINE HCL 25 MG PO CAPS
ORAL_CAPSULE | ORAL | Status: AC
  Filled 2018-07-28: qty 1

## 2018-07-28 MED ORDER — DEXAMETHASONE SODIUM PHOSPHATE 10 MG/ML IJ SOLN
INTRAMUSCULAR | Status: AC
  Filled 2018-07-28: qty 1

## 2018-07-28 MED ORDER — FAMOTIDINE IN NACL 20-0.9 MG/50ML-% IV SOLN
20.0000 mg | Freq: Once | INTRAVENOUS | Status: AC
  Administered 2018-07-28: 20 mg via INTRAVENOUS

## 2018-07-28 MED ORDER — DIPHENHYDRAMINE HCL 25 MG PO CAPS
25.0000 mg | ORAL_CAPSULE | Freq: Once | ORAL | Status: AC
  Administered 2018-07-28: 25 mg via ORAL

## 2018-07-28 MED ORDER — PALONOSETRON HCL INJECTION 0.25 MG/5ML
INTRAVENOUS | Status: AC
  Filled 2018-07-28: qty 5

## 2018-07-28 NOTE — Telephone Encounter (Signed)
Printed calendar and avs. °

## 2018-07-28 NOTE — Patient Instructions (Signed)
   [facility] Discharge Instructions for Patients Receiving Chemotherapy  Today you received the following chemotherapy agents Taxol and Carboplatin   To help prevent nausea and vomiting after your treatment, we encourage you to take your nausea medication as directed.    If you develop nausea and vomiting that is not controlled by your nausea medication, call the clinic.   BELOW ARE SYMPTOMS THAT SHOULD BE REPORTED IMMEDIATELY:  *FEVER GREATER THAN 100.5 F  *CHILLS WITH OR WITHOUT FEVER  NAUSEA AND VOMITING THAT IS NOT CONTROLLED WITH YOUR NAUSEA MEDICATION  *UNUSUAL SHORTNESS OF BREATH  *UNUSUAL BRUISING OR BLEEDING  TENDERNESS IN MOUTH AND THROAT WITH OR WITHOUT PRESENCE OF ULCERS  *URINARY PROBLEMS  *BOWEL PROBLEMS  UNUSUAL RASH Items with * indicate a potential emergency and should be followed up as soon as possible.  Feel free to call the clinic should you have any questions or concerns. The clinic phone number is [phone].  Please show the CHEMO ALERT CARD at check-in to the Emergency Department and triage nurse.   

## 2018-07-28 NOTE — Progress Notes (Signed)
Nutrition follow-up completed with patient and her daughter during infusion for recurrent non-small cell lung cancer. Weight decreased and documented as 128 pounds January 13 down from 130 pounds December 16. Noted glucose 165 and albumin 3.4. Patient is a poor historian and cannot remember the food she has been eating. Her daughter states she feels she has been eating a little better.   Patient denies nausea and vomiting. Patient complains of soft stools and incontinence.  Nutrition diagnosis: Inadequate oral intake continues.  Intervention: Enforced importance of increased calories and protein to stabilize weight. Encourage patient to continue trying to eat more often.  Monitoring, evaluation, goals: Patient will work to increase calories and protein to minimize weight loss.  Next visit: Monday, January 20 during infusion.  **Disclaimer: This note was dictated with voice recognition software. Similar sounding words can inadvertently be transcribed and this note may contain transcription errors which may not have been corrected upon publication of note.**

## 2018-07-28 NOTE — Progress Notes (Signed)
[place] [phone]   [phone]  OFFICE PROGRESS NOTE  [name], MD [place] [id number]  DIAGNOSIS:Recurrent non-small cell lung cancer likely squamous cell carcinoma that was initially diagnosed as a stage IIA(T2b, N0, M0) inAugust 2018status post curative stereotactic radiotherapy completed June 21, 2017. The patient presented today with concerning findings for disease recurrence and progression with enlarging and hypermetabolic right upper lobe lung mass presenting as stage IIIa (T3, N0, M0).  PRIOR THERAPY:Curative stereotactic radiotherapy completed on June 21, 2017.  CURRENT THERAPY:A course of concurrent chemoradiation with chemotherapy consisting of carboplatin for an AUC of 2 and paclitaxel 45 mg/m.  First dose started on 06/23/2018.  Status post 5 cycles.  INTERVAL HISTORY: [name] 78 y.o. female returns to the clinic today for follow-up visit accompanied by her daughter.  The patient is feeling fine today with no concerning complaints.  She continues to tolerate her course of concurrent chemoradiation fairly well except for few pounds of weight loss as well as mild pain in the right breast area.  The patient denied having any chest pain, shortness of breath, cough or hemoptysis.  She denied having any nausea, vomiting, diarrhea or constipation.  She is here today for evaluation before starting cycle #6 of her treatment.   MEDICAL HISTORY: Past Medical History:  Diagnosis Date  . Cancer ([place])    Lung   . Carotid artery occlusion   . Clostridium difficile infection 06/2013  . Diabetes mellitus    takes Janumet daily  . Dyslipidemia    takes Crestor daily  . Gallstones   . GERD (gastroesophageal reflux disease)   . Heart murmur    hx of  . History of radiation therapy 06/11/17-06/21/17   right lung 50 Gy in 5 fractions  . Hypertension    takes Prinzide and Verapamil daily    . Impaired speech    from stroke  . Neuropathy, diabetic ([place])   . Pneumonia   . PONV (postoperative nausea and vomiting)   . Seizures ([place]) 04/19/2018   recent hospitalization  . Smoker   . Stroke ([place]) 06/25/13  . Vertigo    but doesn't take any meds    ALLERGIES:  is allergic to codeine; lipitor [atorvastatin]; and phenergan [promethazine hcl].  MEDICATIONS:  Current Outpatient Medications  Medication Sig Dispense Refill  . ALPRAZolam (XANAX) 0.25 MG tablet Take 1 tablet (0.25 mg total) by mouth every 8 (eight) hours as needed for anxiety (1-2 tablets). 6 tablet 0  . aspirin EC 81 MG tablet Take 81 mg by mouth daily.    . Blood Glucose Monitoring Suppl (ONE TOUCH ULTRA 2) w/Device KIT 1 Device by Does not apply route 2 (two) times daily. 1 each 0  . clopidogrel (PLAVIX) 75 MG tablet TAKE 1 TABLET BY MOUTH  DAILY (Patient taking differently: Take 75 mg by mouth daily. ) 90 tablet 3  . divalproex (DEPAKOTE ER) 500 MG 24 hr tablet Take 1 tablet (500 mg total) by mouth daily. 30 tablet 3  . glucose blood (ONE TOUCH ULTRA TEST) test strip Use as instructed 100 each 12  . Lancets (ONETOUCH ULTRASOFT) lancets Use as instructed 100 each 12  . lisinopril (PRINIVIL,ZESTRIL) 10 MG tablet Take 1 tablet (10 mg total) by mouth daily. 90 tablet 3  . lisinopril-hydrochlorothiazide (PRINZIDE,ZESTORETIC) 20-12.5 MG tablet Take 1 tablet by mouth daily.    . Multiple Vitamins-Minerals (MULTIVITAMIN PO) Take 1 tablet by mouth  daily.    . prochlorperazine (COMPAZINE) 10 MG tablet Take 1 tablet (10 mg total) by mouth every 6 (six) hours as needed for nausea or vomiting. 30 tablet 0  . rosuvastatin (CRESTOR) 20 MG tablet TAKE 1 TABLET BY MOUTH ONCE DAILY (Patient taking differently: Take 20 mg by mouth daily. ) 180 tablet 0  . sitaGLIPtin-metformin (JANUMET) 50-1000 MG tablet Take 1 tablet by mouth 2 (two) times daily. (Patient taking differently: Take 1 tablet by mouth 2 (two) times daily before a meal.  ) 120 tablet 0  . vitamin B-12 (CYANOCOBALAMIN) 1000 MCG tablet Take 1,000 mcg by mouth daily.     No current facility-administered medications for this visit.     SURGICAL HISTORY:  Past Surgical History:  Procedure Laterality Date  . cataract removed Right   . CHOLECYSTECTOMY    . COLONOSCOPY    . ENDARTERECTOMY Left 07/14/2013   Procedure: ENDARTERECTOMY CAROTID-LEFT;  Surgeon: Elam Dutch, MD;  Location: [place];  Service: Vascular;  Laterality: Left;  . EYE SURGERY Left    cataract  . IR PERC PLEURAL DRAIN W/INDWELL CATH W/IMG GUIDE  05/23/2017  . KNEE SURGERY Left 26yr ago  . LAPAROSCOPIC PARTIAL COLECTOMY N/A 09/02/2015   Procedure: LAPAROSCOPIC PARTIAL RIGHT COLECTOMY;  Surgeon: ALeighton Ruff MD;  Location: [facility];  Service: General;  Laterality: N/A;  . PATCH ANGIOPLASTY Left 07/14/2013   Procedure: LEFT CAROTID ARTERY PATCH ANGIOPLASTY;  Surgeon: CElam Dutch MD;  [place] [place]  Service: [place];  Laterality: Left;  .Marland KitchenVIDEO BRONCHOSCOPY N/A 06/04/2018   Procedure: VIDEO BRONCHOSCOPY;  Surgeon: HMelrose Nakayama MD;  [facility];  Service: Thoracic;  Laterality: N/A;  . wisdom      REVIEW OF SYSTEMS:  A comprehensive review of systems was negative except for: Constitutional: positive for fatigue and weight loss   PHYSICAL EXAMINATION: General appearance: alert, cooperative and no distress Head: Normocephalic, without obvious abnormality, atraumatic Neck: no adenopathy, no JVD, supple, symmetrical, trachea midline and thyroid not enlarged, symmetric, no tenderness/mass/nodules Lymph nodes: Cervical, supraclavicular, and axillary nodes normal. Resp: clear to auscultation bilaterally Back: symmetric, no curvature. ROM normal. No CVA tenderness. Cardio: regular rate and rhythm, S1, S2 normal, no murmur, click, rub or gallop GI: soft, non-tender; bowel sounds normal; no masses,  no organomegaly Extremities: extremities normal, atraumatic, no cyanosis or  edema  ECOG PERFORMANCE STATUS: 1 - Symptomatic but completely ambulatory  Blood pressure (!) 154/71, pulse 77, temperature 98.2 F (36.8 C), temperature source Oral, resp. rate 20, height '5\' 3"'  (1.6 m), weight 128 lb (58.1 kg), SpO2 100 %.  LABORATORY DATA: Lab Results  Component Value Date   WBC 6.3 07/28/2018   HGB 9.3 (L) 07/28/2018   HCT 29.6 (L) 07/28/2018   MCV 86.8 07/28/2018   PLT 264 07/28/2018      Chemistry      Component Value Date/Time   NA 138 07/28/2018 1103   NA 139 05/07/2018 1147   K 3.6 07/28/2018 1103   CL 105 07/28/2018 1103   CO2 26 07/28/2018 1103   BUN 14 07/28/2018 1103   BUN 15 05/07/2018 1147   CREATININE 0.79 07/28/2018 1103   CREATININE 0.87 03/08/2015 0001      Component Value Date/Time   CALCIUM 9.0 07/28/2018 1103   ALKPHOS 74 07/28/2018 1103   AST 14 (L) 07/28/2018 1103   ALT 11 07/28/2018 1103   BILITOT 0.5 07/28/2018 1103       RADIOGRAPHIC STUDIES: No results  found.  ASSESSMENT AND PLAN: This is a very pleasant 78 years old white female with recurrent non-small cell lung cancer presented as a stage IIIa involving the right lower lobe. The patient is currently undergoing a course of concurrent chemoradiation with weekly carboplatin and paclitaxel status post 5 cycles.  She continues to tolerate this treatment well with no concerning adverse effects. I recommended for her to proceed with cycle #6 today as scheduled. I will see the patient back for follow-up visit in 1 months after repeating CT scan of the chest for restaging of her disease. The patient was advised to call immediately if she has any concerning symptoms in the interval. The patient voices understanding of current disease status and treatment options and is in agreement with the current care plan. All questions were answered. The patient knows to call the clinic with any problems, questions or concerns. We can certainly see the patient much sooner if necessary.  I  spent 10 minutes counseling the patient face to face. The total time spent in the appointment was 15 minutes.  Disclaimer: This note was dictated with voice recognition software. Similar sounding words can inadvertently be transcribed and may not be corrected upon review.

## 2018-07-29 ENCOUNTER — Other Ambulatory Visit: Payer: Self-pay

## 2018-07-29 ENCOUNTER — Ambulatory Visit
Admission: RE | Admit: 2018-07-29 | Discharge: 2018-07-29 | Disposition: A | Discharge status: Home / Self Care | Payer: Medicare Other | Source: Ambulatory Visit | Attending: Radiation Oncology | Admitting: Radiation Oncology

## 2018-07-29 ENCOUNTER — Ambulatory Visit: Payer: Medicare Other

## 2018-07-29 DIAGNOSIS — C3411 Malignant neoplasm of upper lobe, right bronchus or lung: Secondary | ICD-10-CM | POA: Diagnosis not present

## 2018-07-29 DIAGNOSIS — Z510 Encounter for antineoplastic radiation therapy: Secondary | ICD-10-CM | POA: Diagnosis not present

## 2018-07-29 NOTE — Patient Outreach (Signed)
[place] [facility]) Care Management  07/29/2018  [name] [birth year] [id number]   Attempted outreach with [name] and daughter [name]. Update received from [facility], however call was disconnected during discussion with [name]. Unable to complete call due to member being on the other line. Will place follow up call this week.  PLAN Will follow up this week.  [name] [phone]

## 2018-07-30 ENCOUNTER — Ambulatory Visit
Admission: RE | Admit: 2018-07-30 | Discharge: 2018-07-30 | Disposition: A | Discharge status: Home / Self Care | Payer: Medicare Other | Source: Ambulatory Visit | Attending: Radiation Oncology | Admitting: Radiation Oncology

## 2018-07-30 ENCOUNTER — Ambulatory Visit: Payer: Medicare Other

## 2018-07-30 DIAGNOSIS — Z510 Encounter for antineoplastic radiation therapy: Secondary | ICD-10-CM | POA: Diagnosis not present

## 2018-07-30 DIAGNOSIS — C3411 Malignant neoplasm of upper lobe, right bronchus or lung: Secondary | ICD-10-CM | POA: Diagnosis not present

## 2018-07-31 ENCOUNTER — Ambulatory Visit
Admission: RE | Admit: 2018-07-31 | Discharge: 2018-07-31 | Disposition: A | Discharge status: Home / Self Care | Payer: Medicare Other | Source: Ambulatory Visit | Attending: Radiation Oncology | Admitting: Radiation Oncology

## 2018-07-31 ENCOUNTER — Ambulatory Visit: Payer: Medicare Other

## 2018-07-31 DIAGNOSIS — C3411 Malignant neoplasm of upper lobe, right bronchus or lung: Secondary | ICD-10-CM | POA: Diagnosis not present

## 2018-07-31 DIAGNOSIS — Z510 Encounter for antineoplastic radiation therapy: Secondary | ICD-10-CM | POA: Diagnosis not present

## 2018-08-01 ENCOUNTER — Ambulatory Visit: Payer: Medicare Other

## 2018-08-01 ENCOUNTER — Other Ambulatory Visit: Payer: Self-pay | Admitting: Neurology

## 2018-08-01 ENCOUNTER — Ambulatory Visit
Admission: RE | Admit: 2018-08-01 | Discharge: 2018-08-01 | Disposition: A | Discharge status: Home / Self Care | Payer: Medicare Other | Source: Ambulatory Visit | Attending: Radiation Oncology | Admitting: Radiation Oncology

## 2018-08-01 ENCOUNTER — Other Ambulatory Visit: Payer: Medicare Other

## 2018-08-01 DIAGNOSIS — Z510 Encounter for antineoplastic radiation therapy: Secondary | ICD-10-CM | POA: Diagnosis not present

## 2018-08-01 DIAGNOSIS — C3411 Malignant neoplasm of upper lobe, right bronchus or lung: Secondary | ICD-10-CM | POA: Diagnosis not present

## 2018-08-01 NOTE — Patient Outreach (Signed)
[facility]) Care Management  08/01/2018  [name] [birth year] [id number]    Successful outreach. Confirmed that [name] is doing well and compliant with treatment recommendations. Member is currently receiving chemotherapy and radiation but has not required hospitalization or emergent care related to treatment complications. Member and caregiver/daughter agreeable to case closure due to no further care management needs and primary goals being met. Daughter [name] agreed to contact PCP if [name]' needs change and [facility] assistance is required.  PLAN Will complete case closure.   [place] [phone]

## 2018-08-04 ENCOUNTER — Inpatient Hospital Stay: Payer: Medicare Other

## 2018-08-04 ENCOUNTER — Inpatient Hospital Stay: Payer: Medicare Other | Admitting: Nutrition

## 2018-08-04 ENCOUNTER — Other Ambulatory Visit: Payer: Self-pay | Admitting: Pharmacist

## 2018-08-04 ENCOUNTER — Ambulatory Visit
Admission: RE | Admit: 2018-08-04 | Discharge: 2018-08-04 | Disposition: A | Discharge status: Home / Self Care | Payer: Medicare Other | Source: Ambulatory Visit | Attending: Radiation Oncology | Admitting: Radiation Oncology

## 2018-08-04 ENCOUNTER — Ambulatory Visit: Payer: Medicare Other

## 2018-08-04 VITALS — BP 160/81 | HR 84 | Temp 98.0°F | Resp 20

## 2018-08-04 DIAGNOSIS — C3411 Malignant neoplasm of upper lobe, right bronchus or lung: Secondary | ICD-10-CM | POA: Diagnosis not present

## 2018-08-04 DIAGNOSIS — Z7984 Long term (current) use of oral hypoglycemic drugs: Secondary | ICD-10-CM | POA: Diagnosis not present

## 2018-08-04 DIAGNOSIS — I10 Essential (primary) hypertension: Secondary | ICD-10-CM | POA: Diagnosis not present

## 2018-08-04 DIAGNOSIS — Z8673 Personal history of transient ischemic attack (TIA), and cerebral infarction without residual deficits: Secondary | ICD-10-CM | POA: Diagnosis not present

## 2018-08-04 DIAGNOSIS — C3491 Malignant neoplasm of unspecified part of right bronchus or lung: Secondary | ICD-10-CM

## 2018-08-04 DIAGNOSIS — K219 Gastro-esophageal reflux disease without esophagitis: Secondary | ICD-10-CM | POA: Diagnosis not present

## 2018-08-04 DIAGNOSIS — Z7902 Long term (current) use of antithrombotics/antiplatelets: Secondary | ICD-10-CM | POA: Diagnosis not present

## 2018-08-04 DIAGNOSIS — Z79899 Other long term (current) drug therapy: Secondary | ICD-10-CM | POA: Diagnosis not present

## 2018-08-04 DIAGNOSIS — E785 Hyperlipidemia, unspecified: Secondary | ICD-10-CM | POA: Diagnosis not present

## 2018-08-04 DIAGNOSIS — Z510 Encounter for antineoplastic radiation therapy: Secondary | ICD-10-CM | POA: Diagnosis not present

## 2018-08-04 DIAGNOSIS — E119 Type 2 diabetes mellitus without complications: Secondary | ICD-10-CM | POA: Diagnosis not present

## 2018-08-04 DIAGNOSIS — Z5111 Encounter for antineoplastic chemotherapy: Secondary | ICD-10-CM | POA: Diagnosis not present

## 2018-08-04 DIAGNOSIS — Z7982 Long term (current) use of aspirin: Secondary | ICD-10-CM | POA: Diagnosis not present

## 2018-08-04 DIAGNOSIS — R5383 Other fatigue: Secondary | ICD-10-CM | POA: Diagnosis not present

## 2018-08-04 LAB — CBC WITH DIFFERENTIAL (CANCER CENTER ONLY)
ABS IMMATURE GRANULOCYTES: 0.02 10*3/uL (ref 0.00–0.07)
Basophils Absolute: 0 10*3/uL (ref 0.0–0.1)
Basophils Relative: 0 %
Eosinophils Absolute: 0.1 10*3/uL (ref 0.0–0.5)
Eosinophils Relative: 1 %
HCT: 29.4 % — ABNORMAL LOW (ref 36.0–46.0)
HEMOGLOBIN: 9.1 g/dL — AB (ref 12.0–15.0)
Immature Granulocytes: 0 %
Lymphocytes Relative: 17 %
Lymphs Abs: 0.8 10*3/uL (ref 0.7–4.0)
MCH: 27.2 pg (ref 26.0–34.0)
MCHC: 31 g/dL (ref 30.0–36.0)
MCV: 88 fL (ref 80.0–100.0)
Monocytes Absolute: 0.2 10*3/uL (ref 0.1–1.0)
Monocytes Relative: 5 %
NEUTROS ABS: 3.4 10*3/uL (ref 1.7–7.7)
Neutrophils Relative %: 77 %
Platelet Count: 279 10*3/uL (ref 150–400)
RBC: 3.34 MIL/uL — ABNORMAL LOW (ref 3.87–5.11)
RDW: 21.4 % — ABNORMAL HIGH (ref 11.5–15.5)
WBC Count: 4.5 10*3/uL (ref 4.0–10.5)
nRBC: 0 % (ref 0.0–0.2)

## 2018-08-04 LAB — CMP (CANCER CENTER ONLY)
ALK PHOS: 69 U/L (ref 38–126)
ALT: 12 U/L (ref 0–44)
AST: 11 U/L — ABNORMAL LOW (ref 15–41)
Albumin: 3.2 g/dL — ABNORMAL LOW (ref 3.5–5.0)
Anion gap: 9 (ref 5–15)
BUN: 14 mg/dL (ref 8–23)
CO2: 24 mmol/L (ref 22–32)
CREATININE: 0.82 mg/dL (ref 0.44–1.00)
Calcium: 9 mg/dL (ref 8.9–10.3)
Chloride: 108 mmol/L (ref 98–111)
GFR, Est AFR Am: >60 mL/min (ref >60)
GFR, Estimated: >60 mL/min (ref >60)
Glucose, Bld: 226 mg/dL — ABNORMAL HIGH (ref 70–99)
Potassium: 3.5 mmol/L (ref 3.5–5.1)
Sodium: 141 mmol/L (ref 135–145)
Total Bilirubin: 0.4 mg/dL (ref 0.3–1.2)
Total Protein: 6.4 g/dL — ABNORMAL LOW (ref 6.5–8.1)

## 2018-08-04 MED ORDER — DEXAMETHASONE SODIUM PHOSPHATE 10 MG/ML IJ SOLN
10.0000 mg | Freq: Once | INTRAMUSCULAR | Status: AC
  Administered 2018-08-04: 10 mg via INTRAVENOUS

## 2018-08-04 MED ORDER — SODIUM CHLORIDE 0.9 % IV SOLN
140.0000 mg | Freq: Once | INTRAVENOUS | Status: AC
  Administered 2018-08-04: 140 mg via INTRAVENOUS
  Filled 2018-08-04: qty 14

## 2018-08-04 MED ORDER — DEXAMETHASONE SODIUM PHOSPHATE 10 MG/ML IJ SOLN
INTRAMUSCULAR | Status: AC
  Filled 2018-08-04: qty 1

## 2018-08-04 MED ORDER — FAMOTIDINE IN NACL 20-0.9 MG/50ML-% IV SOLN
20.0000 mg | Freq: Once | INTRAVENOUS | Status: AC
  Administered 2018-08-04: 20 mg via INTRAVENOUS

## 2018-08-04 MED ORDER — DIPHENHYDRAMINE HCL 25 MG PO CAPS
ORAL_CAPSULE | ORAL | Status: AC
  Filled 2018-08-04: qty 1

## 2018-08-04 MED ORDER — PALONOSETRON HCL INJECTION 0.25 MG/5ML
INTRAVENOUS | Status: AC
  Filled 2018-08-04: qty 5

## 2018-08-04 MED ORDER — SODIUM CHLORIDE 0.9 % IV SOLN
45.0000 mg/m2 | Freq: Once | INTRAVENOUS | Status: AC
  Administered 2018-08-04: 72 mg via INTRAVENOUS
  Filled 2018-08-04: qty 12

## 2018-08-04 MED ORDER — FAMOTIDINE IN NACL 20-0.9 MG/50ML-% IV SOLN
INTRAVENOUS | Status: AC
  Filled 2018-08-04: qty 50

## 2018-08-04 MED ORDER — PALONOSETRON HCL INJECTION 0.25 MG/5ML
0.2500 mg | Freq: Once | INTRAVENOUS | Status: AC
  Administered 2018-08-04: 0.25 mg via INTRAVENOUS

## 2018-08-04 MED ORDER — SODIUM CHLORIDE 0.9 % IV SOLN
Freq: Once | INTRAVENOUS | Status: AC
  Administered 2018-08-04: 12:00:00 via INTRAVENOUS
  Filled 2018-08-04: qty 250

## 2018-08-04 MED ORDER — DIPHENHYDRAMINE HCL 25 MG PO CAPS
25.0000 mg | ORAL_CAPSULE | Freq: Once | ORAL | Status: AC
  Administered 2018-08-04: 25 mg via ORAL

## 2018-08-04 NOTE — Patient Outreach (Signed)
[name]) Care Management [place]  08/04/2018  [name] 03/03/1941 [id number]  Reason for referral: medication assistance  [facility] pharmacy case is being closed due to the following reasons:  The patient has chosen to discontinue services. Family counseled to reach out to [facility] care team if services needed in the future.  Patient has been provided [facility] CM contact information if assistance needed in the future.    Thank you for allowing [facility] pharmacy to be involved in this patient's care.     Regina Eck, PharmD, [place]  [phone]

## 2018-08-04 NOTE — Patient Instructions (Signed)
   [facility] Discharge Instructions for Patients Receiving Chemotherapy  Today you received the following chemotherapy agents Taxol and Carboplatin   To help prevent nausea and vomiting after your treatment, we encourage you to take your nausea medication as directed.    If you develop nausea and vomiting that is not controlled by your nausea medication, call the clinic.   BELOW ARE SYMPTOMS THAT SHOULD BE REPORTED IMMEDIATELY:  *FEVER GREATER THAN 100.5 F  *CHILLS WITH OR WITHOUT FEVER  NAUSEA AND VOMITING THAT IS NOT CONTROLLED WITH YOUR NAUSEA MEDICATION  *UNUSUAL SHORTNESS OF BREATH  *UNUSUAL BRUISING OR BLEEDING  TENDERNESS IN MOUTH AND THROAT WITH OR WITHOUT PRESENCE OF ULCERS  *URINARY PROBLEMS  *BOWEL PROBLEMS  UNUSUAL RASH Items with * indicate a potential emergency and should be followed up as soon as possible.  Feel free to call the clinic should you have any questions or concerns. The clinic phone number is [phone].  Please show the CHEMO ALERT CARD at check-in to the Emergency Department and triage nurse.   

## 2018-08-04 NOTE — Progress Notes (Signed)
Nutrition follow-up completed with patient during infusion for recurrent non-small cell lung cancer. Last weight documented was 128 pounds January 13. Labs noted: Glucose 226 and albumin 3.2. Patient reports she is eating and has no nutrition concerns today.  Nutrition diagnosis: Inadequate oral intake is stabilized.  Intervention: Educated patient to continue strategies for adequate calorie and protein intake for weight maintenance. Encourage patient to contact me with any further questions or concerns.  Monitoring, evaluation, goals: Patient will tolerate adequate calories and protein for weight maintenance.  Next visit: To be scheduled as needed.  **Disclaimer: This note was dictated with voice recognition software. Similar sounding words can inadvertently be transcribed and this note may contain transcription errors which may not have been corrected upon publication of note.**

## 2018-08-05 ENCOUNTER — Ambulatory Visit: Payer: Medicare Other

## 2018-08-05 ENCOUNTER — Ambulatory Visit
Admission: RE | Admit: 2018-08-05 | Discharge: 2018-08-05 | Disposition: A | Discharge status: Home / Self Care | Payer: Medicare Other | Source: Ambulatory Visit | Attending: Radiation Oncology | Admitting: Radiation Oncology

## 2018-08-05 DIAGNOSIS — Z510 Encounter for antineoplastic radiation therapy: Secondary | ICD-10-CM | POA: Diagnosis not present

## 2018-08-05 DIAGNOSIS — C3411 Malignant neoplasm of upper lobe, right bronchus or lung: Secondary | ICD-10-CM | POA: Diagnosis not present

## 2018-08-06 ENCOUNTER — Ambulatory Visit: Payer: Medicare Other

## 2018-08-06 ENCOUNTER — Ambulatory Visit
Admission: RE | Admit: 2018-08-06 | Discharge: 2018-08-06 | Disposition: A | Discharge status: Home / Self Care | Payer: Medicare Other | Source: Ambulatory Visit | Attending: Radiation Oncology | Admitting: Radiation Oncology

## 2018-08-06 DIAGNOSIS — Z510 Encounter for antineoplastic radiation therapy: Secondary | ICD-10-CM | POA: Diagnosis not present

## 2018-08-06 DIAGNOSIS — C3411 Malignant neoplasm of upper lobe, right bronchus or lung: Secondary | ICD-10-CM | POA: Diagnosis not present

## 2018-08-07 ENCOUNTER — Ambulatory Visit: Payer: Medicare Other | Admitting: Radiation Oncology

## 2018-08-11 ENCOUNTER — Encounter: Payer: Self-pay | Admitting: Radiation Oncology

## 2018-08-11 NOTE — Progress Notes (Signed)
  Radiation Oncology         [phone] ________________________________  Name: [name] MRN: [id number]  Date: 08/11/2018  DOB: 04-Jul-1941  End of Treatment Note  Diagnosis:    1. Stage III squamous cell carcinoma of right lung (HCC) C34.91 SONAFINE emulsion 1 application  2. Non-small cell cancer of right lung [facility]) C34.91         Indication for treatment:  Curative, recurrent after stereotactic body  radiation therapy       Radiation treatment dates:   06/24/18-08/06/17  Site/dose:   Right lung; 60 Gy in 15 fractions of 2 Gy along with radiosensitizing chemotherapy  Beams/energy:   Photon 3D; 6X, 10X  Narrative: The patient tolerated radiation treatment relatively well.     Pt denied pain, hemoptysis, N/V and trouble swallowing throughout treatments. Towards the end of treatment, pt reported fatigue, occasional cough and resolved shoulder pain. She had one episode of vomiting/car sickness toward the end of treatment. Pt was prescribed Sonafine for skin changes. Overall the pt was without complaints except for fatigue.  Plan: The patient has completed radiation treatment. The patient will return to radiation oncology clinic for routine followup in one month. I advised them to call or return sooner if they have any questions or concerns related to their recovery or treatment.  -----------------------------------  Blair Promise, PhD, MD  This document serves as a record of services personally performed by Gery Pray, MD. It was created on his behalf by Mary-Margaret Loma Messing, a trained medical scribe. The creation of this record is based on the scribe's personal observations and the provider's statements to them. This document has been checked and approved by the attending provider.

## 2018-08-12 ENCOUNTER — Telehealth: Payer: Self-pay

## 2018-08-12 ENCOUNTER — Other Ambulatory Visit: Payer: Self-pay

## 2018-08-12 ENCOUNTER — Other Ambulatory Visit: Payer: Medicare Other

## 2018-08-12 ENCOUNTER — Telehealth: Payer: Self-pay | Admitting: Family Medicine

## 2018-08-12 ENCOUNTER — Ambulatory Visit (INDEPENDENT_AMBULATORY_CARE_PROVIDER_SITE_OTHER): Payer: Medicare Other | Admitting: Neurology

## 2018-08-12 ENCOUNTER — Encounter: Payer: Self-pay | Admitting: Neurology

## 2018-08-12 VITALS — BP 141/77 | HR 85 | Resp 16 | Ht 63.0 in | Wt 130.0 lb

## 2018-08-12 DIAGNOSIS — E538 Deficiency of other specified B group vitamins: Secondary | ICD-10-CM | POA: Diagnosis not present

## 2018-08-12 DIAGNOSIS — R202 Paresthesia of skin: Secondary | ICD-10-CM | POA: Diagnosis not present

## 2018-08-12 MED ORDER — DIVALPROEX SODIUM ER 500 MG PO TB24: ORAL_TABLET | ORAL | 3 refills | Status: DC

## 2018-08-12 MED ORDER — SITAGLIP PHOS-METFORMIN HCL ER 50-1000 MG PO TB24: 1.0000 | ORAL_TABLET | Freq: Every day | ORAL | 0 refills | Status: DC

## 2018-08-12 MED ORDER — CYANOCOBALAMIN 1000 MCG/ML IJ SOLN
1000.0000 ug | Freq: Once | INTRAMUSCULAR | Status: AC
  Administered 2018-08-12: 1000 ug via INTRAMUSCULAR

## 2018-08-12 NOTE — Patient Instructions (Signed)
I had a long discussion with the patient and her daughter regarding her episodes of paresthesias which appear to have resolved after starting Depakote ER.  Continue Depakote ER and the present dose of 500 mg daily.  I also discussed her abnormal brain MRI scan showing a left frontal lesion of unclear etiology which appears to be slightly larger in size but lack of enhancement would make metastasis less likely.  Patient has recently started chemotherapy for her stage III metastatic lung cancer.  I recommend conservative follow-up of the brain lesion for now and return for follow-up in 6 months and may consider follow-up MRI at that time or sooner in case of new neurological symptoms.

## 2018-08-12 NOTE — Telephone Encounter (Signed)
Patient was a no call/no show for their appointment today.   

## 2018-08-12 NOTE — Progress Notes (Signed)
[facility] [address] [place]. [place] [address] [phone]       OFFICE CONSULT NOTE  Ms. NIKEYA MAXIM Date of Birth:  1940/11/19 Medical Record Number:  [id number]   Referring MD: Murray Hodgkins  Reason for Referral: Numbness  HPI: Ms. Auman is a pleasant 78 year old Caucasian lady with past medical history of left frontal MCA branch infarct in December 2014, hypertension, hyperlipidemia, diabetes, diabetic neuropathy who has been having recurrent episodes of transient face and left hand numbness.  History is obtained from the patient and daughter and review of electronic medical records.  I have personally reviewed imaging films.  The daughter states that since April 2019 she has been having frequent episodes in which she starts with slight blurred vision in the left eye followed by her left cheek and left hand numbness.  Her left hand goes to sleep.  She has some trouble speaking because of this episode last typically 15 to 20 minutes.  There is no headache preceding or during these episodes though some of them have been followed by mild headache.  She is fully awake and aware of her surroundings during these episodes.  There are no specific triggers for these episodes and they occur at a variable frequency but may occur several times a month.  She has been hospitalized a few times for these episodes but brain imaging and EEG studies have been unyielding.  Most recently MRI scan of the brain on 04/20/2018 showed old left frontal I per intensity which was felt to be old left frontal infarct.  This was unchanged from prior scans from 2015 though in retrospect when I reviewed the initial MRI from December 2014 which showed the infarct it was much smaller than the present size of the old infarct.  Patient has not had any postcontrast images.  CT angiograms on 04/09/2018 of the brain and neck both showed no significant extracranial or intracranial stenosis.  Transthoracic echo  on 9/26 at 19 was normal.  LDL cholesterol was 36 mg percent and hemoglobin A1c was elevated at 9.4.  EEG done on 04/21/2018 was normal.  These episodes occur at a frequency of every 3 to 4 weeks the mostly involve the left hand and left face but only rarely right hand has also been involved.  The patient feels at times that her left hand may be set drawn up though she denies any weakness or involuntary jerking or movements.  Patient was recently started on Keppra 500 twice daily 2 weeks ago but states she is still had 2 more episodes last Sunday.  Patient has been slightly confused following the last episode and disoriented and daughter feels she has not yet returned back to baseline.  Patient has a history of non-small cell cancer in the right upper lobe of the lung.  She refused surgery and was treated with radiation only.  She recently had a PET scan of the body done this morning the report of which is pending.  She has not been on chemotherapy for her cancer. Update 08/12/2018 : She returns for follow-up after last visit 3 months ago.  She is accompanied by her daughter.  They report no further episodes of paresthesias or other seizure-like of migraine-like episodes.  She is tolerating Depakote ER well without any side effects.  She did have MRI scan of the brain with and without contrast on 06/01/2018 which I personally reviewed showed no acute abnormality or any abnormal enhancing lesion however she had a left  frontal nonspecific white matter hyperintensity which actually appear to be slightly larger in size compared to the previous MRI from January 2015.  She was recently diagnosed with metastatic lung cancer and is presently undergoing chemotherapy and radiation and sees Dr. Julien Nordmann at the cancer center.  She has a follow-up CT scan scheduled on few weeks an appointment to see Dr. Drusilla Kanner to discuss further treatment.  She has no new neurological complaints.  She continues to have mild short-term memory and  cognitive difficulties which appear to be unchanged and are not progressive.  She denies significant headaches or focal neurological deficits. ROS:   14 system review of systems is positive for runny nose, cough, lung cancer and all other systems negative  PMH:  Past Medical History:  Diagnosis Date  . Cancer ([place])    Lung   . Carotid artery occlusion   . Clostridium difficile infection 06/2013  . Diabetes mellitus    takes Janumet daily  . Dyslipidemia    takes Crestor daily  . Gallstones   . GERD (gastroesophageal reflux disease)   . Heart murmur    hx of  . History of radiation therapy 06/11/17-06/21/17   right lung 50 Gy in 5 fractions  . Hypertension    takes Prinzide and Verapamil daily  . Impaired speech    from stroke  . Neuropathy, diabetic ([place])   . Pneumonia   . PONV (postoperative nausea and vomiting)   . Seizures ([place]) 04/19/2018   recent hospitalization  . Smoker   . Stroke ([place]) 06/25/13  . Vertigo    but doesn't take any meds    Social History:  Social History   Socioeconomic History  . Marital status: Widowed    Spouse name: Jeneen Rinks  . Number of children: 3  . Years of education: 10  . Highest education level: Not on file  Occupational History  . Occupation: retired  Scientific laboratory technician  . Financial resource strain: Not on file  . Food insecurity:    Worry: Not on file    Inability: Not on file  . Transportation needs:    Medical: Not on file    Non-medical: Not on file  Tobacco Use  . Smoking status: Current Every Day Smoker    Packs/day: 0.50    Years: 60.00    Pack years: 30.00    Types: Cigarettes  . Smokeless tobacco: Never Used  Substance and Sexual Activity  . Alcohol use: No    Alcohol/week: 0.0 standard drinks  . Drug use: No  . Sexual activity: Yes  Lifestyle  . Physical activity:    Days per week: Not on file    Minutes per session: Not on file  . Stress: Not on file  Relationships  . Social connections:    Talks on phone: Not  on file    Gets together: Not on file    Attends religious service: Not on file    Active member of club or organization: Not on file    Attends meetings of clubs or organizations: Not on file    Relationship status: Not on file  . Intimate partner violence:    Fear of current or ex partner: Not on file    Emotionally abused: Not on file    Physically abused: Not on file    Forced sexual activity: Not on file  Other Topics Concern  . Not on file  Social History Narrative   Patient is married with 3 children.   Patient  is right handed.   Patient has 10 th grade education.   Patient drinks 4 cups daily.    Medications:   Current Outpatient Medications on File Prior to Visit  Medication Sig Dispense Refill  . aspirin EC 81 MG tablet Take 81 mg by mouth daily.    . Blood Glucose Monitoring Suppl (ONE TOUCH ULTRA 2) w/Device KIT 1 Device by Does not apply route 2 (two) times daily. 1 each 0  . clopidogrel (PLAVIX) 75 MG tablet TAKE 1 TABLET BY MOUTH  DAILY (Patient taking differently: Take 75 mg by mouth daily. ) 90 tablet 3  . glucose blood (ONE TOUCH ULTRA TEST) test strip Use as instructed 100 each 12  . Lancets (ONETOUCH ULTRASOFT) lancets Use as instructed 100 each 12  . lisinopril-hydrochlorothiazide (PRINZIDE,ZESTORETIC) 20-12.5 MG tablet TAKE 1 TABLET BY MOUTH  DAILY 90 tablet 1  . Multiple Vitamins-Minerals (MULTIVITAMIN PO) Take 1 tablet by mouth daily.    . prochlorperazine (COMPAZINE) 10 MG tablet Take 1 tablet (10 mg total) by mouth every 6 (six) hours as needed for nausea or vomiting. 30 tablet 0  . rosuvastatin (CRESTOR) 20 MG tablet TAKE 1 TABLET BY MOUTH ONCE DAILY (Patient taking differently: Take 20 mg by mouth daily. ) 180 tablet 0  . vitamin B-12 (CYANOCOBALAMIN) 1000 MCG tablet Take 1,000 mcg by mouth daily.    Marland Kitchen ALPRAZolam (XANAX) 0.25 MG tablet Take 1 tablet (0.25 mg total) by mouth every 8 (eight) hours as needed for anxiety (1-2 tablets). (Patient not taking:  Reported on 08/12/2018) 6 tablet 0  . lisinopril (PRINIVIL,ZESTRIL) 10 MG tablet Take 1 tablet (10 mg total) by mouth daily. (Patient not taking: Reported on 08/12/2018) 90 tablet 3   No current facility-administered medications on file prior to visit.     Allergies:   Allergies  Allergen Reactions  . Codeine Nausea And Vomiting  . Lipitor [Atorvastatin] Nausea And Vomiting  . Phenergan [Promethazine Hcl] Other (See Comments)    Confusion, hallucinations, severe agitation    Physical Exam General: Frail cachectic looking elderly Caucasian lady, seated, in no evident distress Head: head normocephalic and atraumatic.   Neck: supple with no carotid or supraclavicular bruits Cardiovascular: regular rate and rhythm, no murmurs Musculoskeletal: no deformity Skin:  no rash/petichiae Vascular:  Normal pulses all extremities  Neurologic Exam Mental Status: Awake and fully alert. Oriented to place and time. Recent and remote memory poor. Attention span, concentration and fund of knowledge diminished. Mood and affect appropriate.  Mini-Mental status exam score 23/30 recall 0/3.  Able to name only 4 animals with  4 legs. Cranial Nerves: Fundoscopic exam reveals sharp disc margins. Pupils equal, briskly reactive to light. Extraocular movements full without nystagmus. Visual fields full to confrontation. Hearing intact. Facial sensation intact. Face, tongue, palate moves normally and symmetrically.  Motor: Normal bulk and tone. Normal strength in all tested extremity muscles. Sensory.: intact to touch , pinprick , position and vibratory sensation.  Coordination: Rapid alternating movements normal in all extremities. Finger-to-nose and heel-to-shin performed accurately bilaterally. Gait and Station: Arises from chair without difficulty. Stance is broad-based l. Gait demonstrates mild imbalance.  Unable to stand on either foot unsupported or on a narrow base..  Not able to heel, toe and tandem walk  .    Reflexes: 1+ and symmetric. Toes downgoing.      ASSESSMENT: 78 year old lady with recurrent transient episodes of mostly left face and hand paresthesias of unclear etiology.  Which appear to have responded to  Depakote.  Recent cognitive impairment and memory difficulties and gait imbalance also need evaluation.  Remote history of left frontal MCA branch infarct in December 2014.  She also has history of prostatic lung cancer and recently started on chemotherapy and radiation   PLAN: I had a long discussion with the patient and her daughter regarding her episodes of paresthesias which appear to have resolved after starting Depakote ER.  Continue Depakote ER and the present dose of 500 mg daily.  I also discussed her abnormal brain MRI scan showing a left frontal lesion of unclear etiology which appears to be slightly larger in size but lack of enhancement would make metastasis less likely.  Patient has recently started chemotherapy for her stage III metastatic lung cancer.  I recommend conservative follow-up of the brain lesion for now and return for follow-up in 6 months and may consider follow-up MRI at that time or sooner in case of new neurological symptoms.  Greater than 50% time during this 30-minute  visit was spent on counseling and coordination of care about her numbness episodes, cognitive impairment and answering questions   Antony Contras, MD  [facility] [address] [place], [place] [address]  Phone [phone] Fax [phone] Note: This document was prepared with digital dictation and possible smart phrase technology. Any transcriptional errors that result from this process are unintentional.

## 2018-08-12 NOTE — Telephone Encounter (Signed)
Pt given samples Janumet 50-1000

## 2018-08-29 ENCOUNTER — Ambulatory Visit (HOSPITAL_COMMUNITY)
Admission: RE | Admit: 2018-08-29 | Discharge: 2018-08-29 | Disposition: A | Discharge status: Home / Self Care | Payer: Medicare Other | Source: Ambulatory Visit | Attending: Internal Medicine | Admitting: Internal Medicine

## 2018-08-29 ENCOUNTER — Inpatient Hospital Stay: Payer: Medicare Other | Attending: Internal Medicine

## 2018-08-29 DIAGNOSIS — I10 Essential (primary) hypertension: Secondary | ICD-10-CM | POA: Diagnosis not present

## 2018-08-29 DIAGNOSIS — Z923 Personal history of irradiation: Secondary | ICD-10-CM | POA: Diagnosis not present

## 2018-08-29 DIAGNOSIS — C3411 Malignant neoplasm of upper lobe, right bronchus or lung: Secondary | ICD-10-CM | POA: Diagnosis not present

## 2018-08-29 DIAGNOSIS — K219 Gastro-esophageal reflux disease without esophagitis: Secondary | ICD-10-CM | POA: Diagnosis not present

## 2018-08-29 DIAGNOSIS — E119 Type 2 diabetes mellitus without complications: Secondary | ICD-10-CM | POA: Diagnosis not present

## 2018-08-29 DIAGNOSIS — Z5112 Encounter for antineoplastic immunotherapy: Secondary | ICD-10-CM | POA: Diagnosis not present

## 2018-08-29 DIAGNOSIS — R091 Pleurisy: Secondary | ICD-10-CM | POA: Insufficient documentation

## 2018-08-29 DIAGNOSIS — J90 Pleural effusion, not elsewhere classified: Secondary | ICD-10-CM | POA: Diagnosis not present

## 2018-08-29 DIAGNOSIS — R5383 Other fatigue: Secondary | ICD-10-CM | POA: Insufficient documentation

## 2018-08-29 DIAGNOSIS — Z7982 Long term (current) use of aspirin: Secondary | ICD-10-CM | POA: Insufficient documentation

## 2018-08-29 DIAGNOSIS — E785 Hyperlipidemia, unspecified: Secondary | ICD-10-CM | POA: Insufficient documentation

## 2018-08-29 DIAGNOSIS — C3490 Malignant neoplasm of unspecified part of unspecified bronchus or lung: Secondary | ICD-10-CM | POA: Diagnosis not present

## 2018-08-29 DIAGNOSIS — R05 Cough: Secondary | ICD-10-CM | POA: Insufficient documentation

## 2018-08-29 DIAGNOSIS — Z8673 Personal history of transient ischemic attack (TIA), and cerebral infarction without residual deficits: Secondary | ICD-10-CM | POA: Diagnosis not present

## 2018-08-29 DIAGNOSIS — Z9221 Personal history of antineoplastic chemotherapy: Secondary | ICD-10-CM | POA: Diagnosis not present

## 2018-08-29 DIAGNOSIS — Z79899 Other long term (current) drug therapy: Secondary | ICD-10-CM | POA: Diagnosis not present

## 2018-08-29 DIAGNOSIS — Z7902 Long term (current) use of antithrombotics/antiplatelets: Secondary | ICD-10-CM | POA: Diagnosis not present

## 2018-08-29 LAB — CBC WITH DIFFERENTIAL (CANCER CENTER ONLY)
Abs Immature Granulocytes: 0.04 10*3/uL (ref 0.00–0.07)
Basophils Absolute: 0 10*3/uL (ref 0.0–0.1)
Basophils Relative: 0 %
Eosinophils Absolute: 0 10*3/uL (ref 0.0–0.5)
Eosinophils Relative: 0 %
HCT: 36.5 % (ref 36.0–46.0)
HEMOGLOBIN: 11.5 g/dL — AB (ref 12.0–15.0)
Immature Granulocytes: 0 %
LYMPHS PCT: 15 %
Lymphs Abs: 1.4 10*3/uL (ref 0.7–4.0)
MCH: 28.3 pg (ref 26.0–34.0)
MCHC: 31.5 g/dL (ref 30.0–36.0)
MCV: 89.9 fL (ref 80.0–100.0)
Monocytes Absolute: 1 10*3/uL (ref 0.1–1.0)
Monocytes Relative: 10 %
Neutro Abs: 7 10*3/uL (ref 1.7–7.7)
Neutrophils Relative %: 75 %
Platelet Count: 341 10*3/uL (ref 150–400)
RBC: 4.06 MIL/uL (ref 3.87–5.11)
RDW: 22.7 % — ABNORMAL HIGH (ref 11.5–15.5)
WBC Count: 9.4 10*3/uL (ref 4.0–10.5)
nRBC: 0 % (ref 0.0–0.2)

## 2018-08-29 LAB — CMP (CANCER CENTER ONLY)
ALT: 9 U/L (ref 0–44)
AST: 15 U/L (ref 15–41)
Albumin: 3.5 g/dL (ref 3.5–5.0)
Alkaline Phosphatase: 81 U/L (ref 38–126)
Anion gap: 10 (ref 5–15)
BUN: 17 mg/dL (ref 8–23)
CALCIUM: 9.5 mg/dL (ref 8.9–10.3)
CO2: 26 mmol/L (ref 22–32)
Chloride: 103 mmol/L (ref 98–111)
Creatinine: 0.88 mg/dL (ref 0.44–1.00)
GFR, Est AFR Am: >60 mL/min (ref >60)
GFR, Estimated: >60 mL/min (ref >60)
Glucose, Bld: 198 mg/dL — ABNORMAL HIGH (ref 70–99)
Potassium: 4 mmol/L (ref 3.5–5.1)
Sodium: 139 mmol/L (ref 135–145)
Total Bilirubin: 0.5 mg/dL (ref 0.3–1.2)
Total Protein: 7 g/dL (ref 6.5–8.1)

## 2018-08-29 MED ORDER — SODIUM CHLORIDE (PF) 0.9 % IJ SOLN
INTRAMUSCULAR | Status: AC
  Filled 2018-08-29: qty 50

## 2018-08-29 MED ORDER — IOHEXOL 300 MG/ML  SOLN
75.0000 mL | Freq: Once | INTRAMUSCULAR | Status: AC | PRN
  Administered 2018-08-29: 75 mL via INTRAVENOUS

## 2018-09-01 ENCOUNTER — Encounter: Payer: Self-pay | Admitting: Internal Medicine

## 2018-09-01 ENCOUNTER — Inpatient Hospital Stay (HOSPITAL_BASED_OUTPATIENT_CLINIC_OR_DEPARTMENT_OTHER): Payer: Medicare Other | Admitting: Internal Medicine

## 2018-09-01 ENCOUNTER — Telehealth: Payer: Self-pay | Admitting: Internal Medicine

## 2018-09-01 VITALS — BP 169/71 | HR 90 | Temp 97.9°F | Resp 17 | Ht 63.0 in | Wt 133.8 lb

## 2018-09-01 DIAGNOSIS — K219 Gastro-esophageal reflux disease without esophagitis: Secondary | ICD-10-CM | POA: Diagnosis not present

## 2018-09-01 DIAGNOSIS — E119 Type 2 diabetes mellitus without complications: Secondary | ICD-10-CM | POA: Diagnosis not present

## 2018-09-01 DIAGNOSIS — Z79899 Other long term (current) drug therapy: Secondary | ICD-10-CM | POA: Diagnosis not present

## 2018-09-01 DIAGNOSIS — R5383 Other fatigue: Secondary | ICD-10-CM

## 2018-09-01 DIAGNOSIS — Z923 Personal history of irradiation: Secondary | ICD-10-CM | POA: Diagnosis not present

## 2018-09-01 DIAGNOSIS — E785 Hyperlipidemia, unspecified: Secondary | ICD-10-CM

## 2018-09-01 DIAGNOSIS — Z9221 Personal history of antineoplastic chemotherapy: Secondary | ICD-10-CM | POA: Diagnosis not present

## 2018-09-01 DIAGNOSIS — Z7982 Long term (current) use of aspirin: Secondary | ICD-10-CM | POA: Diagnosis not present

## 2018-09-01 DIAGNOSIS — I10 Essential (primary) hypertension: Secondary | ICD-10-CM

## 2018-09-01 DIAGNOSIS — C3411 Malignant neoplasm of upper lobe, right bronchus or lung: Secondary | ICD-10-CM

## 2018-09-01 DIAGNOSIS — Z7902 Long term (current) use of antithrombotics/antiplatelets: Secondary | ICD-10-CM | POA: Diagnosis not present

## 2018-09-01 DIAGNOSIS — R05 Cough: Secondary | ICD-10-CM

## 2018-09-01 DIAGNOSIS — Z5112 Encounter for antineoplastic immunotherapy: Secondary | ICD-10-CM | POA: Diagnosis not present

## 2018-09-01 DIAGNOSIS — C3491 Malignant neoplasm of unspecified part of right bronchus or lung: Secondary | ICD-10-CM

## 2018-09-01 DIAGNOSIS — Z8673 Personal history of transient ischemic attack (TIA), and cerebral infarction without residual deficits: Secondary | ICD-10-CM

## 2018-09-01 DIAGNOSIS — Z7189 Other specified counseling: Secondary | ICD-10-CM

## 2018-09-01 DIAGNOSIS — R091 Pleurisy: Secondary | ICD-10-CM

## 2018-09-01 MED ORDER — LIDOCAINE-PRILOCAINE 2.5-2.5 % EX CREA: 1.0000 "application " | TOPICAL_CREAM | CUTANEOUS | 0 refills | Status: DC | PRN

## 2018-09-01 NOTE — Progress Notes (Signed)
[facility] [phone]   Fax:([phone]  OFFICE PROGRESS NOTE  [name], MD [place] [id number]  DIAGNOSIS:Recurrent non-small cell lung cancer likely squamous cell carcinoma that was initially diagnosed as a stage IIA(T2b, N0, M0) inAugust 2018status post curative stereotactic radiotherapy completed June 21, 2017. The patient presented today with concerning findings for disease recurrence and progression with enlarging and hypermetabolic right upper lobe lung mass presenting as stage IIIa (T3, N0, M0).  PDL 1 expression 0%.  PRIOR THERAPY: 1) Curative stereotactic radiotherapy completed on June 21, 2017. 2) A course of concurrent chemoradiation with chemotherapy consisting of carboplatin for an AUC of 2 and paclitaxel 45 mg/m.  First dose started on 06/23/2018.  Status post 7 cycles.  Last dose of chemotherapy was given August 04, 2018.  CURRENT THERAPY:Consolidation immunotherapy with Imfinzi 10 mg/KG every 2 weeks.  First dose September 09, 2018.  INTERVAL HISTORY: [name] 78 y.o. female returns to the clinic today for follow-up visit accompanied by her daughter.  The patient is feeling fine today with no concerning complaints.  She tolerated the previous course of concurrent chemoradiation fairly well.  She denied having any significant nausea, vomiting, diarrhea or constipation.  She had mild aching on the right side of the chest but no significant shortness of breath she has mild cough with clear sputum and no hemoptysis.  She denied having any recent weight loss or night sweats.  She has no headache or visual changes.  The patient had repeat CT scan of the chest performed recently and she is here for evaluation and discussion of her scan results.   MEDICAL HISTORY: Past Medical History:  Diagnosis Date  . Cancer ([place])    Lung   . Carotid artery occlusion   . Clostridium difficile infection  06/2013  . Diabetes mellitus    takes Janumet daily  . Dyslipidemia    takes Crestor daily  . Gallstones   . GERD (gastroesophageal reflux disease)   . Heart murmur    hx of  . History of radiation therapy 06/11/17-06/21/17   right lung 50 Gy in 5 fractions  . Hypertension    takes Prinzide and Verapamil daily  . Impaired speech    from stroke  . Neuropathy, diabetic ([place])   . Pneumonia   . PONV (postoperative nausea and vomiting)   . Seizures ([place]) 04/19/2018   recent hospitalization  . Smoker   . Stroke ([place]) 06/25/13  . Vertigo    but doesn't take any meds    ALLERGIES:  is allergic to codeine; lipitor [atorvastatin]; and phenergan [promethazine hcl].  MEDICATIONS:  Current Outpatient Medications  Medication Sig Dispense Refill  . ALPRAZolam (XANAX) 0.25 MG tablet Take 1 tablet (0.25 mg total) by mouth every 8 (eight) hours as needed for anxiety (1-2 tablets). (Patient not taking: Reported on 08/12/2018) 6 tablet 0  . aspirin EC 81 MG tablet Take 81 mg by mouth daily.    . Blood Glucose Monitoring Suppl (ONE TOUCH ULTRA 2) w/Device KIT 1 Device by Does not apply route 2 (two) times daily. 1 each 0  . clopidogrel (PLAVIX) 75 MG tablet TAKE 1 TABLET BY MOUTH  DAILY (Patient taking differently: Take 75 mg by mouth daily. ) 90 tablet 3  . divalproex (DEPAKOTE ER) 500 MG 24 hr tablet TAKE 1 TABLET(500 MG) BY MOUTH DAILY 30 tablet 3  . glucose blood (ONE TOUCH ULTRA TEST) test strip Use  as instructed 100 each 12  . Lancets (ONETOUCH ULTRASOFT) lancets Use as instructed 100 each 12  . lisinopril (PRINIVIL,ZESTRIL) 10 MG tablet Take 1 tablet (10 mg total) by mouth daily. (Patient not taking: Reported on 08/12/2018) 90 tablet 3  . lisinopril-hydrochlorothiazide (PRINZIDE,ZESTORETIC) 20-12.5 MG tablet TAKE 1 TABLET BY MOUTH  DAILY 90 tablet 1  . Multiple Vitamins-Minerals (MULTIVITAMIN PO) Take 1 tablet by mouth daily.    . prochlorperazine (COMPAZINE) 10 MG tablet Take 1 tablet (10 mg  total) by mouth every 6 (six) hours as needed for nausea or vomiting. 30 tablet 0  . rosuvastatin (CRESTOR) 20 MG tablet TAKE 1 TABLET BY MOUTH ONCE DAILY (Patient taking differently: Take 20 mg by mouth daily. ) 180 tablet 0  . SitaGLIPtin-MetFORMIN HCl (JANUMET XR) 50-1000 MG TB24 Take 1 tablet by mouth daily. 28 tablet 0  . vitamin B-12 (CYANOCOBALAMIN) 1000 MCG tablet Take 1,000 mcg by mouth daily.     No current facility-administered medications for this visit.     SURGICAL HISTORY:  Past Surgical History:  Procedure Laterality Date  . cataract removed Right   . CHOLECYSTECTOMY    . COLONOSCOPY    . ENDARTERECTOMY Left 07/14/2013   Procedure: ENDARTERECTOMY CAROTID-LEFT;  Surgeon: Elam Dutch, MD;  Location: [facility];  Service: Vascular;  Laterality: Left;  . EYE SURGERY Left    cataract  . IR PERC PLEURAL DRAIN W/INDWELL CATH W/IMG GUIDE  05/23/2017  . KNEE SURGERY Left 46yr ago  . LAPAROSCOPIC PARTIAL COLECTOMY N/A 09/02/2015   Procedure: LAPAROSCOPIC PARTIAL RIGHT COLECTOMY;  Surgeon: ALeighton Ruff MD;  Location: [facility];  Service: General;  Laterality: N/A;  . PATCH ANGIOPLASTY Left 07/14/2013   Procedure: LEFT CAROTID ARTERY PATCH ANGIOPLASTY;  Surgeon: CElam Dutch MD;  [facility] [facility]  Service: Vascular;  Laterality: Left;  .Marland KitchenVIDEO BRONCHOSCOPY N/A 06/04/2018   Procedure: VIDEO BRONCHOSCOPY;  Surgeon: HMelrose Nakayama MD;  [facility];  Service: Thoracic;  Laterality: N/A;  . wisdom      REVIEW OF SYSTEMS:  Constitutional: positive for fatigue Eyes: negative Ears, nose, mouth, throat, and face: negative Respiratory: positive for cough and pleurisy/chest pain Cardiovascular: negative Gastrointestinal: negative Genitourinary:negative Integument/breast: negative Hematologic/lymphatic: negative Musculoskeletal:negative Neurological: negative Behavioral/Psych: negative Endocrine: negative Allergic/Immunologic: negative   PHYSICAL EXAMINATION:  General appearance: alert, cooperative and no distress Head: Normocephalic, without obvious abnormality, atraumatic Neck: no adenopathy, no JVD, supple, symmetrical, trachea midline and thyroid not enlarged, symmetric, no tenderness/mass/nodules Lymph nodes: Cervical, supraclavicular, and axillary nodes normal. Resp: clear to auscultation bilaterally Back: symmetric, no curvature. ROM normal. No CVA tenderness. Cardio: regular rate and rhythm, S1, S2 normal, no murmur, click, rub or gallop GI: soft, non-tender; bowel sounds normal; no masses,  no organomegaly Extremities: extremities normal, atraumatic, no cyanosis or edema Neurologic: Alert and oriented X 3, normal strength and tone. Normal symmetric reflexes. Normal coordination and gait  ECOG PERFORMANCE STATUS: 1 - Symptomatic but completely ambulatory  Blood pressure (!) 169/71, pulse 90, temperature 97.9 F (36.6 C), temperature source Oral, resp. rate 17, height _0  (1.6 m), weight 133 lb 12.8 oz (60.7 kg), SpO2 99 %.  LABORATORY DATA: Lab Results  Component Value Date   WBC 9.4 08/29/2018   HGB 11.5 (L) 08/29/2018   HCT 36.5 08/29/2018   MCV 89.9 08/29/2018   PLT 341 08/29/2018      Chemistry      Component Value Date/Time   NA 139 08/29/2018 1332   NA 139  05/07/2018 1147   K 4.0 08/29/2018 1332   CL 103 08/29/2018 1332   CO2 26 08/29/2018 1332   BUN 17 08/29/2018 1332   BUN 15 05/07/2018 1147   CREATININE 0.88 08/29/2018 1332   CREATININE 0.87 03/08/2015 0001      Component Value Date/Time   CALCIUM 9.5 08/29/2018 1332   ALKPHOS 81 08/29/2018 1332   AST 15 08/29/2018 1332   ALT 9 08/29/2018 1332   BILITOT 0.5 08/29/2018 1332       RADIOGRAPHIC STUDIES: Ct Chest W Contrast  Result Date: 09/01/2018 CLINICAL DATA:  Non-small cell lung cancer. Chemotherapy and radiation therapy complete. EXAM: CT CHEST WITH CONTRAST TECHNIQUE: Multidetector CT imaging of the chest was performed during intravenous contrast  administration. CONTRAST:  66m OMNIPAQUE IOHEXOL 300 MG/ML  SOLN COMPARISON:  PET 05/07/2018 and CT chest 02/27/2018. FINDINGS: Cardiovascular: Atherosclerotic calcification of the aorta and coronary arteries. Heart size normal. No pericardial effusion. Mediastinum/Nodes: No pathologically enlarged mediastinal, hilar or axillary lymph nodes. Esophagus is grossly unremarkable. Lungs/Pleura: Image quality is somewhat degraded by respiratory motion. A necrotic mass in the right upper lobe measures 3.7 x 3.8 cm (series 2, image 51), previously 4.6 x 5.6 cm when remeasured on the prior exam. Narrowing/obstruction of right upper lobe bronchi with associated collapse/consolidation, minimally improved from 05/07/2018. Small loculated right pleural effusion, increased from 05/07/2018. Left lung is clear. Airway is otherwise unremarkable. Upper Abdomen: Liver margin is slightly irregular. Visualized portions of the liver and right adrenal gland are unremarkable. Slight nodular thickening of the left adrenal gland, unchanged. Scarring in the upper pole right kidney. Low-attenuation lesions in the upper pole left kidney measure up to 1.9 cm and are likely cysts. Low-attenuation lesion along the periphery of the spleen measures 1.3 cm and is unchanged from multiple prior exams, indicative of a benign lesion. Visualized portions of the pancreas, stomach and bowel are grossly unremarkable. Cholecystectomy. Midline ventral hernia contains omental fat, incompletely imaged. No upper abdominal adenopathy. Musculoskeletal: Degenerative changes in the spine. No worrisome lytic or sclerotic lesions. IMPRESSION: 1. Decrease in size of a right upper lobe necrotic mass, with associated narrowing/obstruction of right upper lobe bronchi. Slight improvement in postobstructive pneumonitis in the right upper lobe. 2. No evidence of distant metastatic disease. 3. Small loculated right pleural effusion, increased. 4. Marginal irregularity of the  liver is indicative of cirrhosis. 5. Midline ventral hernia contains omental. 6. Aortic atherosclerosis (ICD10-170.0). Coronary artery calcification. Electronically Signed   By: MLorin PicketM.D.   On: 09/01/2018 08:36    ASSESSMENT AND PLAN: This is a very pleasant 78years old white female with recurrent non-small cell lung cancer presented as a stage IIIa involving the right lower lobe. The patient underwent a course of concurrent chemoradiation with weekly carboplatin and paclitaxel status post 7 cycles.  She tolerated this treatment well. She had repeat CT scan of the chest performed recently.  I personally and independently reviewed the scan images and discussed the results with the patient and her daughter. Her scan showed improvement of the right lung mass with no other concerning findings for progression. I had a lengthy discussion with the patient today about her current condition and treatment options.  I gave the patient the option of continuous observation and close monitoring versus proceeding with treatment with consolidation immunotherapy with Imfinzi 10 mg/KG every 2 weeks for a total of 1 year unless the patient has disease progression or unacceptable toxicity. I discussed with the patient the progression free  survival with and without immunotherapy. The patient and her daughter are interested in proceeding with immunotherapy and she is expected to start the first cycle of this treatment next week. I discussed with her the adverse effect of the treatment including but not limited to immunotherapy mediated skin rash, diarrhea, inflammation of the lung, kidney, liver, thyroid or other endocrine dysfunction including type 1 diabetes mellitus. The patient will come back for follow-up visit in 3 weeks for evaluation with the start of cycle #2. For hypertension she was strongly advised to take her blood pressure medication as prescribed and to monitor it closely at home. The patient was  advised to call immediately if she has any other concerning symptoms in the interval. The patient voices understanding of current disease status and treatment options and is in agreement with the current care plan. All questions were answered. The patient knows to call the clinic with any problems, questions or concerns. We can certainly see the patient much sooner if necessary.  Disclaimer: This note was dictated with voice recognition software. Similar sounding words can inadvertently be transcribed and may not be corrected upon review.

## 2018-09-01 NOTE — Addendum Note (Signed)
Addended by: Curt Bears on: 09/01/2018 09:30 AM   Modules accepted: Orders

## 2018-09-01 NOTE — Telephone Encounter (Signed)
Scheduled appt per 02/17 los.  Printed calendar and avs.

## 2018-09-05 ENCOUNTER — Encounter: Payer: Self-pay | Admitting: Internal Medicine

## 2018-09-09 ENCOUNTER — Inpatient Hospital Stay: Payer: Medicare Other

## 2018-09-09 ENCOUNTER — Other Ambulatory Visit (INDEPENDENT_AMBULATORY_CARE_PROVIDER_SITE_OTHER): Payer: Medicare Other

## 2018-09-09 VITALS — BP 164/61 | HR 82 | Temp 98.0°F | Resp 18

## 2018-09-09 DIAGNOSIS — R5383 Other fatigue: Secondary | ICD-10-CM | POA: Diagnosis not present

## 2018-09-09 DIAGNOSIS — Z9221 Personal history of antineoplastic chemotherapy: Secondary | ICD-10-CM | POA: Diagnosis not present

## 2018-09-09 DIAGNOSIS — E785 Hyperlipidemia, unspecified: Secondary | ICD-10-CM | POA: Diagnosis not present

## 2018-09-09 DIAGNOSIS — R091 Pleurisy: Secondary | ICD-10-CM | POA: Diagnosis not present

## 2018-09-09 DIAGNOSIS — E538 Deficiency of other specified B group vitamins: Secondary | ICD-10-CM | POA: Diagnosis not present

## 2018-09-09 DIAGNOSIS — C3491 Malignant neoplasm of unspecified part of right bronchus or lung: Secondary | ICD-10-CM

## 2018-09-09 DIAGNOSIS — Z79899 Other long term (current) drug therapy: Secondary | ICD-10-CM | POA: Diagnosis not present

## 2018-09-09 DIAGNOSIS — K219 Gastro-esophageal reflux disease without esophagitis: Secondary | ICD-10-CM | POA: Diagnosis not present

## 2018-09-09 DIAGNOSIS — Z7982 Long term (current) use of aspirin: Secondary | ICD-10-CM | POA: Diagnosis not present

## 2018-09-09 DIAGNOSIS — C3411 Malignant neoplasm of upper lobe, right bronchus or lung: Secondary | ICD-10-CM | POA: Diagnosis not present

## 2018-09-09 DIAGNOSIS — E119 Type 2 diabetes mellitus without complications: Secondary | ICD-10-CM | POA: Diagnosis not present

## 2018-09-09 DIAGNOSIS — R05 Cough: Secondary | ICD-10-CM | POA: Diagnosis not present

## 2018-09-09 DIAGNOSIS — Z923 Personal history of irradiation: Secondary | ICD-10-CM | POA: Diagnosis not present

## 2018-09-09 DIAGNOSIS — Z8673 Personal history of transient ischemic attack (TIA), and cerebral infarction without residual deficits: Secondary | ICD-10-CM | POA: Diagnosis not present

## 2018-09-09 DIAGNOSIS — Z7902 Long term (current) use of antithrombotics/antiplatelets: Secondary | ICD-10-CM | POA: Diagnosis not present

## 2018-09-09 DIAGNOSIS — Z5112 Encounter for antineoplastic immunotherapy: Secondary | ICD-10-CM | POA: Diagnosis not present

## 2018-09-09 DIAGNOSIS — I10 Essential (primary) hypertension: Secondary | ICD-10-CM | POA: Diagnosis not present

## 2018-09-09 LAB — CMP (CANCER CENTER ONLY)
ALT: 10 U/L (ref 0–44)
AST: 11 U/L — ABNORMAL LOW (ref 15–41)
Albumin: 3.3 g/dL — ABNORMAL LOW (ref 3.5–5.0)
Alkaline Phosphatase: 68 U/L (ref 38–126)
Anion gap: 12 (ref 5–15)
BILIRUBIN TOTAL: 0.3 mg/dL (ref 0.3–1.2)
BUN: 17 mg/dL (ref 8–23)
CO2: 24 mmol/L (ref 22–32)
Calcium: 9.4 mg/dL (ref 8.9–10.3)
Chloride: 105 mmol/L (ref 98–111)
Creatinine: 0.92 mg/dL (ref 0.44–1.00)
GFR, Est AFR Am: >60 mL/min (ref >60)
GFR, Estimated: >60 mL/min (ref >60)
Glucose, Bld: 261 mg/dL — ABNORMAL HIGH (ref 70–99)
POTASSIUM: 4 mmol/L (ref 3.5–5.1)
Sodium: 141 mmol/L (ref 135–145)
TOTAL PROTEIN: 6.5 g/dL (ref 6.5–8.1)

## 2018-09-09 LAB — CBC WITH DIFFERENTIAL (CANCER CENTER ONLY)
ABS IMMATURE GRANULOCYTES: 0.04 10*3/uL (ref 0.00–0.07)
Basophils Absolute: 0 10*3/uL (ref 0.0–0.1)
Basophils Relative: 0 %
Eosinophils Absolute: 0.3 10*3/uL (ref 0.0–0.5)
Eosinophils Relative: 3 %
HCT: 36 % (ref 36.0–46.0)
Hemoglobin: 11.4 g/dL — ABNORMAL LOW (ref 12.0–15.0)
Immature Granulocytes: 0 %
Lymphocytes Relative: 14 %
Lymphs Abs: 1.4 10*3/uL (ref 0.7–4.0)
MCH: 28.5 pg (ref 26.0–34.0)
MCHC: 31.7 g/dL (ref 30.0–36.0)
MCV: 90 fL (ref 80.0–100.0)
Monocytes Absolute: 0.7 10*3/uL (ref 0.1–1.0)
Monocytes Relative: 7 %
Neutro Abs: 7.6 10*3/uL (ref 1.7–7.7)
Neutrophils Relative %: 76 %
Platelet Count: 390 10*3/uL (ref 150–400)
RBC: 4 MIL/uL (ref 3.87–5.11)
RDW: 21.9 % — ABNORMAL HIGH (ref 11.5–15.5)
WBC Count: 10.1 10*3/uL (ref 4.0–10.5)
nRBC: 0 % (ref 0.0–0.2)

## 2018-09-09 LAB — TSH: TSH: 1.974 u[IU]/mL (ref 0.308–3.960)

## 2018-09-09 MED ORDER — SODIUM CHLORIDE 0.9 % IV SOLN
Freq: Once | INTRAVENOUS | Status: AC
  Administered 2018-09-09: 09:00:00 via INTRAVENOUS
  Filled 2018-09-09: qty 250

## 2018-09-09 MED ORDER — SODIUM CHLORIDE 0.9 % IV SOLN
10.0000 mg/kg | Freq: Once | INTRAVENOUS | Status: DC
  Filled 2018-09-09: qty 12

## 2018-09-09 MED ORDER — SODIUM CHLORIDE 0.9 % IV SOLN
620.0000 mg | Freq: Once | INTRAVENOUS | Status: AC
  Administered 2018-09-09: 620 mg via INTRAVENOUS
  Filled 2018-09-09: qty 10

## 2018-09-09 NOTE — Patient Instructions (Signed)
[facility] Discharge Instructions for Patients Receiving Chemotherapy  Today you received the following chemotherapy agents: durvalumab (Imfinzi).   To help prevent nausea and vomiting after your treatment, we encourage you to take your nausea medication as directed.    If you develop nausea and vomiting that is not controlled by your nausea medication, call the clinic.   BELOW ARE SYMPTOMS THAT SHOULD BE REPORTED IMMEDIATELY:  *FEVER GREATER THAN 100.5 F  *CHILLS WITH OR WITHOUT FEVER  NAUSEA AND VOMITING THAT IS NOT CONTROLLED WITH YOUR NAUSEA MEDICATION  *UNUSUAL SHORTNESS OF BREATH  *UNUSUAL BRUISING OR BLEEDING  TENDERNESS IN MOUTH AND THROAT WITH OR WITHOUT PRESENCE OF ULCERS  *URINARY PROBLEMS  *BOWEL PROBLEMS  UNUSUAL RASH Items with * indicate a potential emergency and should be followed up as soon as possible.  Feel free to call the clinic should you have any questions or concerns. The clinic phone number is [phone].  Please show the [place] at check-in to the Emergency Department and triage nurse.  Durvalumab injection What is this medicine? DURVALUMAB (dur VAL ue mab) is a monoclonal antibody. It is used to treat urothelial cancer and lung cancer. This medicine may be used for other purposes; ask your health care provider or pharmacist if you have questions. COMMON BRAND NAME(S): IMFINZI What should I tell my health care provider before I take this medicine? They need to know if you have any of these conditions: -diabetes -immune system problems -infection -inflammatory bowel disease -kidney disease -liver disease -lung or breathing disease -lupus -organ transplant -stomach or intestine problems -thyroid disease -an unusual or allergic reaction to durvalumab, other medicines, foods, dyes, or preservatives -pregnant or trying to get pregnant -breast-feeding How should I use this medicine? This medicine is for infusion  into a vein. It is given by a health care professional in a hospital or clinic setting. A special MedGuide will be given to you before each treatment. Be sure to read this information carefully each time. Talk to your pediatrician regarding the use of this medicine in children. Special care may be needed. Overdosage: If you think you have taken too much of this medicine contact a poison control center or emergency room at once. NOTE: This medicine is only for you. Do not share this medicine with others. What if I miss a dose? It is important not to miss your dose. Call your doctor or health care professional if you are unable to keep an appointment. What may interact with this medicine? Interactions have not been studied. This list may not describe all possible interactions. Give your health care provider a list of all the medicines, herbs, non-prescription drugs, or dietary supplements you use. Also tell them if you smoke, drink alcohol, or use illegal drugs. Some items may interact with your medicine. What should I watch for while using this medicine? This drug may make you feel generally unwell. Continue your course of treatment even though you feel ill unless your doctor tells you to stop. You may need blood work done while you are taking this medicine. Do not become pregnant while taking this medicine or for 3 months after stopping it. Women should inform their doctor if they wish to become pregnant or think they might be pregnant. There is a potential for serious side effects to an unborn child. Talk to your health care professional or pharmacist for more information. Do not breast-feed an infant while taking this medicine or for 3 months after  stopping it. What side effects may I notice from receiving this medicine? Side effects that you should report to your doctor or health care professional as soon as possible: -allergic reactions like skin rash, itching or hives, swelling of the face,  lips, or tongue -black, tarry stools -bloody or watery diarrhea -breathing problems -change in emotions or moods -change in sex drive -changes in vision -chest pain or chest tightness -chills -confusion -cough -facial flushing -fever -headache -signs and symptoms of high blood sugar such as dizziness; dry mouth; dry skin; fruity breath; nausea; stomach pain; increased hunger or thirst; increased urination -signs and symptoms of liver injury like dark yellow or brown urine; general ill feeling or flu-like symptoms; light-colored stools; loss of appetite; nausea; right upper belly pain; unusually weak or tired; yellowing of the eyes or skin -stomach pain -trouble passing urine or change in the amount of urine -weight gain or weight loss Side effects that usually do not require medical attention (report these to your doctor or health care professional if they continue or are bothersome): -bone pain -constipation -loss of appetite -muscle pain -nausea -swelling of the ankles, feet, hands -tiredness This list may not describe all possible side effects. Call your doctor for medical advice about side effects. You may report side effects to FDA at 1-800-FDA-1088. Where should I keep my medicine? This drug is given in a hospital or clinic and will not be stored at home. NOTE: This sheet is a summary. It may not cover all possible information. If you have questions about this medicine, talk to your doctor, pharmacist, or health care provider.  2019 Elsevier/Gold Standard (2016-09-11 19:25:04)

## 2018-09-10 ENCOUNTER — Telehealth: Payer: Self-pay | Admitting: Medical Oncology

## 2018-09-10 MED ORDER — CYANOCOBALAMIN 1000 MCG/ML IJ SOLN
1000.0000 ug | Freq: Once | INTRAMUSCULAR | Status: AC
  Administered 2018-09-09: 1000 ug via INTRAMUSCULAR

## 2018-09-10 NOTE — Telephone Encounter (Signed)
She is doing fine up and doing normal activities ,eating and drinking.

## 2018-09-11 ENCOUNTER — Other Ambulatory Visit: Payer: Self-pay | Admitting: Radiology

## 2018-09-12 ENCOUNTER — Telehealth: Payer: Self-pay | Admitting: *Deleted

## 2018-09-12 ENCOUNTER — Other Ambulatory Visit: Payer: Self-pay | Admitting: Radiology

## 2018-09-12 NOTE — Telephone Encounter (Signed)
CALLED PATIENT TO ALTER FU FOR 09-15-18 DUE TO DR. KINARD BEING IN THE OR, PATIENT'S DAUGHTER- LISA INMAN AGREED TO COME ON 10-13-18 @ 3 PM

## 2018-09-15 ENCOUNTER — Ambulatory Visit (HOSPITAL_COMMUNITY)
Admission: RE | Admit: 2018-09-15 | Discharge: 2018-09-15 | Disposition: A | Discharge status: Home / Self Care | Payer: Medicare Other | Source: Ambulatory Visit

## 2018-09-15 ENCOUNTER — Encounter (HOSPITAL_COMMUNITY): Payer: Self-pay

## 2018-09-15 ENCOUNTER — Ambulatory Visit: Payer: Medicare Other | Admitting: Radiation Oncology

## 2018-09-15 ENCOUNTER — Ambulatory Visit (HOSPITAL_COMMUNITY)
Admission: RE | Admit: 2018-09-15 | Discharge: 2018-09-15 | Disposition: A | Discharge status: Home / Self Care | Payer: Medicare Other | Source: Ambulatory Visit | Attending: Internal Medicine | Admitting: Internal Medicine

## 2018-09-15 ENCOUNTER — Other Ambulatory Visit: Payer: Self-pay

## 2018-09-15 ENCOUNTER — Other Ambulatory Visit: Payer: Self-pay | Admitting: Internal Medicine

## 2018-09-15 DIAGNOSIS — R569 Unspecified convulsions: Secondary | ICD-10-CM | POA: Diagnosis not present

## 2018-09-15 DIAGNOSIS — K219 Gastro-esophageal reflux disease without esophagitis: Secondary | ICD-10-CM | POA: Diagnosis not present

## 2018-09-15 DIAGNOSIS — Z8673 Personal history of transient ischemic attack (TIA), and cerebral infarction without residual deficits: Secondary | ICD-10-CM | POA: Insufficient documentation

## 2018-09-15 DIAGNOSIS — F17200 Nicotine dependence, unspecified, uncomplicated: Secondary | ICD-10-CM | POA: Diagnosis not present

## 2018-09-15 DIAGNOSIS — Z7982 Long term (current) use of aspirin: Secondary | ICD-10-CM | POA: Insufficient documentation

## 2018-09-15 DIAGNOSIS — E1140 Type 2 diabetes mellitus with diabetic neuropathy, unspecified: Secondary | ICD-10-CM | POA: Insufficient documentation

## 2018-09-15 DIAGNOSIS — Z7984 Long term (current) use of oral hypoglycemic drugs: Secondary | ICD-10-CM | POA: Diagnosis not present

## 2018-09-15 DIAGNOSIS — C3491 Malignant neoplasm of unspecified part of right bronchus or lung: Secondary | ICD-10-CM

## 2018-09-15 DIAGNOSIS — Z923 Personal history of irradiation: Secondary | ICD-10-CM | POA: Diagnosis not present

## 2018-09-15 DIAGNOSIS — Z79899 Other long term (current) drug therapy: Secondary | ICD-10-CM | POA: Insufficient documentation

## 2018-09-15 DIAGNOSIS — E785 Hyperlipidemia, unspecified: Secondary | ICD-10-CM | POA: Diagnosis not present

## 2018-09-15 DIAGNOSIS — I10 Essential (primary) hypertension: Secondary | ICD-10-CM | POA: Insufficient documentation

## 2018-09-15 DIAGNOSIS — I2510 Atherosclerotic heart disease of native coronary artery without angina pectoris: Secondary | ICD-10-CM | POA: Diagnosis not present

## 2018-09-15 DIAGNOSIS — Z452 Encounter for adjustment and management of vascular access device: Secondary | ICD-10-CM | POA: Diagnosis not present

## 2018-09-15 HISTORY — PX: IR IMAGING GUIDED PORT INSERTION: IMG5740

## 2018-09-15 LAB — CBC WITH DIFFERENTIAL/PLATELET
Abs Immature Granulocytes: 0.03 10*3/uL (ref 0.00–0.07)
Basophils Absolute: 0 10*3/uL (ref 0.0–0.1)
Basophils Relative: 0 %
Eosinophils Absolute: 0.3 10*3/uL (ref 0.0–0.5)
Eosinophils Relative: 3 %
HCT: 39.6 % (ref 36.0–46.0)
Hemoglobin: 11.9 g/dL — ABNORMAL LOW (ref 12.0–15.0)
Immature Granulocytes: 0 %
Lymphocytes Relative: 15 %
Lymphs Abs: 1.6 10*3/uL (ref 0.7–4.0)
MCH: 28 pg (ref 26.0–34.0)
MCHC: 30.1 g/dL (ref 30.0–36.0)
MCV: 93.2 fL (ref 80.0–100.0)
Monocytes Absolute: 0.8 10*3/uL (ref 0.1–1.0)
Monocytes Relative: 8 %
NEUTROS ABS: 8.1 10*3/uL — AB (ref 1.7–7.7)
Neutrophils Relative %: 74 %
Platelets: 399 10*3/uL (ref 150–400)
RBC: 4.25 MIL/uL (ref 3.87–5.11)
RDW: 21.5 % — AB (ref 11.5–15.5)
WBC: 10.8 10*3/uL — ABNORMAL HIGH (ref 4.0–10.5)
nRBC: 0 % (ref 0.0–0.2)

## 2018-09-15 LAB — PROTIME-INR
INR: 0.9 (ref 0.8–1.2)
Prothrombin Time: 11.7 seconds (ref 11.4–15.2)

## 2018-09-15 MED ORDER — LIDOCAINE-EPINEPHRINE (PF) 2 %-1:200000 IJ SOLN
INTRAMUSCULAR | Status: AC
  Filled 2018-09-15: qty 20

## 2018-09-15 MED ORDER — LIDOCAINE-EPINEPHRINE (PF) 1 %-1:200000 IJ SOLN
INTRAMUSCULAR | Status: AC | PRN
  Administered 2018-09-15: 5 mL

## 2018-09-15 MED ORDER — MIDAZOLAM HCL 2 MG/2ML IJ SOLN
INTRAMUSCULAR | Status: AC
  Filled 2018-09-15: qty 2

## 2018-09-15 MED ORDER — FENTANYL CITRATE (PF) 100 MCG/2ML IJ SOLN
INTRAMUSCULAR | Status: AC
  Filled 2018-09-15: qty 2

## 2018-09-15 MED ORDER — HEPARIN SOD (PORK) LOCK FLUSH 100 UNIT/ML IV SOLN
INTRAVENOUS | Status: AC | PRN
  Administered 2018-09-15: 500 [IU] via INTRAVENOUS

## 2018-09-15 MED ORDER — MIDAZOLAM HCL 2 MG/2ML IJ SOLN
INTRAMUSCULAR | Status: AC | PRN
  Administered 2018-09-15: 1 mg via INTRAVENOUS
  Administered 2018-09-15 (×2): 0.5 mg via INTRAVENOUS

## 2018-09-15 MED ORDER — LIDOCAINE-EPINEPHRINE (PF) 1 %-1:200000 IJ SOLN
INTRAMUSCULAR | Status: AC | PRN
  Administered 2018-09-15: 10 mL

## 2018-09-15 MED ORDER — CEFAZOLIN SODIUM-DEXTROSE 2-4 GM/100ML-% IV SOLN
INTRAVENOUS | Status: AC
  Administered 2018-09-15: 2 g via INTRAVENOUS
  Filled 2018-09-15: qty 100

## 2018-09-15 MED ORDER — CEFAZOLIN SODIUM-DEXTROSE 2-4 GM/100ML-% IV SOLN
2.0000 g | INTRAVENOUS | Status: AC
  Administered 2018-09-15: 2 g via INTRAVENOUS

## 2018-09-15 MED ORDER — ONDANSETRON HCL 4 MG/2ML IJ SOLN
INTRAMUSCULAR | Status: AC
  Filled 2018-09-15: qty 2

## 2018-09-15 MED ORDER — HEPARIN SOD (PORK) LOCK FLUSH 100 UNIT/ML IV SOLN
INTRAVENOUS | Status: AC
  Filled 2018-09-15: qty 5

## 2018-09-15 MED ORDER — SODIUM CHLORIDE 0.9 % IV SOLN
INTRAVENOUS | Status: DC
  Administered 2018-09-15: 12:00:00 via INTRAVENOUS

## 2018-09-15 MED ORDER — FENTANYL CITRATE (PF) 100 MCG/2ML IJ SOLN
INTRAMUSCULAR | Status: AC | PRN
  Administered 2018-09-15 (×2): 50 ug via INTRAVENOUS

## 2018-09-15 MED ORDER — ONDANSETRON HCL 4 MG/2ML IJ SOLN
INTRAMUSCULAR | Status: AC | PRN
  Administered 2018-09-15: 4 mg via INTRAVENOUS

## 2018-09-15 NOTE — Sedation Documentation (Signed)
Pt with post procedural  N/V 4mg  zofran given

## 2018-09-15 NOTE — Discharge Instructions (Signed)
Do not use EMLA cream on the skin glue (dermabond) as the EMLA cream will dissolve the skin glue. The nurses at the cancer center will tell you when to start using EMLA cream.      Implanted [facility] Guide An implanted port is a device that is placed under the skin. It is usually placed in the chest. The device can be used to give IV medicine, to take blood, or for dialysis. You may have an implanted port if:  You need IV medicine that would be irritating to the small veins in your hands or arms.  You need IV medicines, such as antibiotics, for a long period of time.  You need IV nutrition for a long period of time.  You need dialysis. Having a port means that your health care provider will not need to use the veins in your arms for these procedures. You may have fewer limitations when using a port than you would if you used other types of long-term IVs, and you will likely be able to return to normal activities after your incision heals. An implanted port has two main parts:  Reservoir. The reservoir is the part where a needle is inserted to give medicines or draw blood. The reservoir is round. After it is placed, it appears as a small, raised area under your skin.  Catheter. The catheter is a thin, flexible tube that connects the reservoir to a vein. Medicine that is inserted into the reservoir goes into the catheter and then into the vein. How is my port accessed? To access your port:  A numbing cream may be placed on the skin over the port site.  Your health care provider will put on a mask and sterile gloves.  The skin over your port will be cleaned carefully with a germ-killing soap and allowed to dry.  Your health care provider will gently pinch the port and insert a needle into it.  Your health care provider will check for a blood return to make sure the port is in the vein and is not clogged.  If your port needs to remain accessed to get medicine continuously (constant  infusion), your health care provider will place a clear bandage (dressing) over the needle site. The dressing and needle will need to be changed every week, or as told by your health care provider. What is flushing? Flushing helps keep the port from getting clogged. Follow instructions from your health care provider about how and when to flush the port. Ports are usually flushed with saline solution or a medicine called heparin. The need for flushing will depend on how the port is used:  If the port is only used from time to time to give medicines or draw blood, the port may need to be flushed: ? Before and after medicines have been given. ? Before and after blood has been drawn. ? As part of routine maintenance. Flushing may be recommended every 4-6 weeks.  If a constant infusion is running, the port may not need to be flushed.  Throw away any syringes in a disposal container that is meant for sharp items (sharps container). You can buy a sharps container from a pharmacy, or you can make one by using an empty hard plastic bottle with a cover. How long will my port stay implanted? The port can stay in for as long as your health care provider thinks it is needed. When it is time for the port to come out, a surgery  will be done to remove it. The surgery will be similar to the procedure that was done to put the port in. Follow these instructions at home:   Flush your port as told by your health care provider.  If you need an infusion over several days, follow instructions from your health care provider about how to take care of your port site. Make sure you: ? Wash your hands with soap and water before you change your dressing. If soap and water are not available, use alcohol-based hand sanitizer. ? Change your dressing as told by your health care provider. ? Place any used dressings or infusion bags into a plastic bag. Throw that bag in the trash. ? Keep the dressing that covers the needle clean  and dry. Do not get it wet. ? Do not use scissors or sharp objects near the tube. ? Keep the tube clamped, unless it is being used.  Check your port site every day for signs of infection. Check for: ? Redness, swelling, or pain. ? Fluid or blood. ? Pus or a bad smell.  Protect the skin around the port site. ? Avoid wearing bra straps that rub or irritate the site. ? Protect the skin around your port from seat belts. Place a soft pad over your chest if needed.  Bathe or shower as told by your health care provider. The site may get wet as long as you are not actively receiving an infusion.  Return to your normal activities as told by your health care provider. Ask your health care provider what activities are safe for you.  Carry a medical alert card or wear a medical alert bracelet at all times. This will let health care providers know that you have an implanted port in case of an emergency. Get help right away if:  You have redness, swelling, or pain at the port site.  You have fluid or blood coming from your port site.  You have pus or a bad smell coming from the port site.  You have a fever. Summary  Implanted ports are usually placed in the chest for long-term IV access.  Follow instructions from your health care provider about flushing the port and changing bandages (dressings).  Take care of the area around your port by avoiding clothing that puts pressure on the area, and by watching for signs of infection.  Protect the skin around your port from seat belts. Place a soft pad over your chest if needed.  Get help right away if you have a fever or you have redness, swelling, pain, drainage, or a bad smell at the port site. This information is not intended to replace advice given to you by your health care provider. Make sure you discuss any questions you have with your health care provider. Document Released: 07/02/2005 Document Revised: 08/04/2016 Document Reviewed:  08/04/2016 Elsevier Interactive Patient Education  2019 [employer].    Moderate Conscious Sedation, Adult, Care After These instructions provide you with information about caring for yourself after your procedure. Your health care provider may also give you more specific instructions. Your treatment has been planned according to current medical practices, but problems sometimes occur. Call your health care provider if you have any problems or questions after your procedure. What can I expect after the procedure? After your procedure, it is common:  To feel sleepy for several hours.  To feel clumsy and have poor balance for several hours.  To have poor judgment for several hours.  To vomit if you eat too soon. Follow these instructions at home: For at least 24 hours after the procedure:   Do not: ? Participate in activities where you could fall or become injured. ? Drive. ? Use heavy machinery. ? Drink alcohol. ? Take sleeping pills or medicines that cause drowsiness. ? Make important decisions or sign legal documents. ? Take care of children on your own.  Rest. Eating and drinking  Follow the diet recommended by your health care provider.  If you vomit: ? Drink water, juice, or soup when you can drink without vomiting. ? Make sure you have little or no nausea before eating solid foods. General instructions  Have a responsible adult stay with you until you are awake and alert.  Take over-the-counter and prescription medicines only as told by your health care provider.  If you smoke, do not smoke without supervision.  Keep all follow-up visits as told by your health care provider. This is important. Contact a health care provider if:  You keep feeling nauseous or you keep vomiting.  You feel light-headed.  You develop a rash.  You have a fever. Get help right away if:  You have trouble breathing. This information is not intended to replace advice given to  you by your health care provider. Make sure you discuss any questions you have with your health care provider. Document Released: 04/22/2013 Document Revised: 12/05/2015 Document Reviewed: 10/22/2015 Elsevier Interactive Patient Education  2019 [employer].

## 2018-09-15 NOTE — Progress Notes (Signed)
Patient arrives to unit vomiting. She has just received zofran in IR. HOB is up 60 degrees. Oral care provided.

## 2018-09-15 NOTE — H&P (Addendum)
Referring Physician(s): Mohamed,Mohamed  Supervising Physician: Jacqulynn Cadet  Patient Status:  [facility] OP  Chief Complaint: "I'm here for a port a cath"   Subjective: Patient familiar to IR service from prior right lung mass biopsy on 05/23/2017 with subsequent chest tube placement for pneumothorax.  She has a history of recurrent non-small cell right lung cancer/ squamous cell carcinoma that was initially diagnosed as a stage IIa in August 2018 with prior chemoradiation. She has poor venous access and presents today for Port-A-Cath placement for planned immunotherapy.  She currently denies fever, worsening dyspnea, abdominal pain, back pain, nausea, vomiting or bleeding.  She does have occasional headaches, cough, intermittent right upper anterior chest burning sensation.  She continues to smoke.  Past Medical History:  Diagnosis Date  . Cancer ([place])    Lung   . Carotid artery occlusion   . Clostridium difficile infection 06/2013  . Diabetes mellitus    takes Janumet daily  . Dyslipidemia    takes Crestor daily  . Gallstones   . GERD (gastroesophageal reflux disease)   . Heart murmur    hx of  . History of radiation therapy 06/11/17-06/21/17   right lung 50 Gy in 5 fractions  . Hypertension    takes Prinzide and Verapamil daily  . Impaired speech    from stroke  . Neuropathy, diabetic ([place])   . Pneumonia   . PONV (postoperative nausea and vomiting)   . Seizures ([place]) 04/19/2018   recent hospitalization  . Smoker   . Stroke ([place]) 06/25/13  . Vertigo    but doesn't take any meds   Past Surgical History:  Procedure Laterality Date  . cataract removed Right   . CHOLECYSTECTOMY    . COLONOSCOPY    . ENDARTERECTOMY Left 07/14/2013   Procedure: ENDARTERECTOMY CAROTID-LEFT;  Surgeon: Elam Dutch, MD;  Location: [facility];  Service: Vascular;  Laterality: Left;  . EYE SURGERY Left    cataract  . IR PERC PLEURAL DRAIN W/INDWELL CATH W/IMG GUIDE  05/23/2017  . KNEE  SURGERY Left 57yr ago  . LAPAROSCOPIC PARTIAL COLECTOMY N/A 09/02/2015   Procedure: LAPAROSCOPIC PARTIAL RIGHT COLECTOMY;  Surgeon: ALeighton Ruff MD;  Location: [facility];  Service: General;  Laterality: N/A;  . PATCH ANGIOPLASTY Left 07/14/2013   Procedure: LEFT CAROTID ARTERY PATCH ANGIOPLASTY;  Surgeon: CElam Dutch MD;  [facility]  Service: Vascular;  Laterality: Left;  .Marland KitchenVIDEO BRONCHOSCOPY N/A 06/04/2018   Procedure: VIDEO BRONCHOSCOPY;  Surgeon: HMelrose Nakayama MD;  [facility];  Service: Thoracic;  Laterality: N/A;  . wisdom        Allergies: Codeine; Lipitor [atorvastatin]; and Phenergan [promethazine hcl]  Medications: Prior to Admission medications   Medication Sig Start Date End Date Taking? Authorizing Provider  ALPRAZolam (XANAX) 0.25 MG tablet Take 1 tablet (0.25 mg total) by mouth every 8 (eight) hours as needed for anxiety (1-2 tablets). Patient not taking: Reported on 08/12/2018 06/23/18   SLadell Pier MD  aspirin EC 81 MG tablet Take 81 mg by mouth daily.    [provider]  Blood Glucose Monitoring Suppl (ONE TOUCH ULTRA 2) w/Device KIT 1 Device by Does not apply route 2 (two) times daily. 08/21/17   LDenita Lung MD  clopidogrel (PLAVIX) 75 MG tablet TAKE 1 TABLET BY MOUTH  DAILY Patient taking differently: Take 75 mg by mouth daily.  12/12/17   LDenita Lung MD  divalproex (DEPAKOTE ER) 500 MG 24 hr tablet  TAKE 1 TABLET(500 MG) BY MOUTH DAILY 08/12/18   Garvin Fila, MD  glucose blood (ONE TOUCH ULTRA TEST) test strip Use as instructed 02/10/18   [name], MD  Lancets [facility] ULTRASOFT) lancets Use as instructed 08/21/17   [name], MD  lidocaine-prilocaine (EMLA) cream Apply 1 application topically as needed. 09/01/18   Curt Bears, MD  lisinopril (PRINIVIL,ZESTRIL) 10 MG tablet Take 1 tablet (10 mg total) by mouth daily. 06/05/18   [name], MD  lisinopril-hydrochlorothiazide (PRINZIDE,ZESTORETIC) 20-12.5  MG tablet TAKE 1 TABLET BY MOUTH  DAILY 07/28/18   [name], MD  Multiple Vitamins-Minerals (MULTIVITAMIN PO) Take 1 tablet by mouth daily.    [provider]  prochlorperazine (COMPAZINE) 10 MG tablet Take 1 tablet (10 mg total) by mouth every 6 (six) hours as needed for nausea or vomiting. Patient not taking: Reported on 09/01/2018 06/09/18   Maryanna Shape, NP  rosuvastatin (CRESTOR) 20 MG tablet TAKE 1 TABLET BY MOUTH ONCE DAILY Patient taking differently: Take 20 mg by mouth daily.  04/30/18   [name], MD  SitaGLIPtin-MetFORMIN HCl (JANUMET XR) 50-1000 MG TB24 Take 1 tablet by mouth daily. 08/12/18   [name], MD  vitamin B-12 (CYANOCOBALAMIN) 1000 MCG tablet Take 1,000 mcg by mouth daily.    [provider]     Vital Signs: Blood pressure 190/79, temp 97.9, heart rate 83, respirations 16, O2 sat 98% room air   Physical Exam awake, alert.  Chest with distant breath sounds bilaterally.  Heart with regular rate and rhythm.  Abdomen soft, positive bowel sounds, nontender.  No lower extremity edema.  Imaging: No results found.  Labs:  CBC: Recent Labs    07/28/18 1103 08/04/18 1028 08/29/18 1332 09/09/18 0816  WBC 6.3 4.5 9.4 10.1  HGB 9.3* 9.1* 11.5* 11.4*  HCT 29.6* 29.4* 36.5 36.0  PLT 264 279 341 390    COAGS: Recent Labs    03/30/18 1732 04/09/18 1734 04/19/18 1541 06/02/18 1540  INR 1.05 1.01 1.02 1.06  APTT _0 BMP: Recent Labs    07/28/18 1103 08/04/18 1028 08/29/18 1332 09/09/18 0816  NA 138 141 139 141  K 3.6 3.5 4.0 4.0  CL 105 108 103 105  CO2 _1 GLUCOSE 165* 226* 198* 261*  BUN _2 CALCIUM 9.0 9.0 9.5 9.4  CREATININE 0.79 0.82 0.88 0.92  GFRNONAA >60 >60 >60 >60  GFRAA >60 >60 >60 >60    LIVER FUNCTION TESTS: Recent Labs    07/28/18 1103 08/04/18 1028 08/29/18 1332 09/09/18 0816  BILITOT 0.5 0.4 0.5 0.3  AST 14* 11* 15 11*  ALT _3 ALKPHOS 74 69 81 68    PROT 6.6 6.4* 7.0 6.5  ALBUMIN 3.4* 3.2* 3.5 3.3*    Assessment and Plan: Pt with long term tobacco abuse and history of recurrent non-small cell right lung cancer/ squamous cell carcinoma that was initially diagnosed as a stage IIa in August 2018 with prior chemoradiation. She has poor venous access and presents today for Port-A-Cath placement for planned immunotherapy. Risks and benefits of image guided port-a-catheter placement was discussed with the patient including, but not limited to bleeding, infection, pneumothorax, or fibrin sheath development and need for additional procedures.  All of the patient's questions were answered, patient is agreeable to proceed. Consent signed and in chart.  LABS PENDING   Electronically Signed:  D. Rowe Robert, PA-C 09/15/2018, 12:04 PM   I spent a total of 25 minutes at the the patient's bedside AND on the patient's hospital floor or unit, greater than 50% of which was counseling/coordinating care for Port-A-Cath placement

## 2018-09-15 NOTE — Procedures (Signed)
Interventional Radiology Procedure Note  Procedure: Placement of a right IJ approach single lumen PowerPort.  Tip is positioned at the superior cavoatrial junction and catheter is ready for immediate use.  Complications: No immediate Recommendations:  - Ok to shower tomorrow - Do not submerge for 7 days - Routine line care   Signed,  Heath K. McCullough, MD   

## 2018-09-24 ENCOUNTER — Inpatient Hospital Stay: Payer: Medicare Other | Attending: Internal Medicine

## 2018-09-24 ENCOUNTER — Inpatient Hospital Stay: Payer: Medicare Other

## 2018-09-24 ENCOUNTER — Encounter: Payer: Self-pay | Admitting: Physician Assistant

## 2018-09-24 ENCOUNTER — Other Ambulatory Visit: Payer: Self-pay | Admitting: *Deleted

## 2018-09-24 ENCOUNTER — Inpatient Hospital Stay (HOSPITAL_BASED_OUTPATIENT_CLINIC_OR_DEPARTMENT_OTHER): Payer: Medicare Other | Admitting: Physician Assistant

## 2018-09-24 ENCOUNTER — Encounter: Payer: Self-pay | Admitting: *Deleted

## 2018-09-24 ENCOUNTER — Other Ambulatory Visit: Payer: Self-pay

## 2018-09-24 VITALS — BP 157/69 | HR 92 | Temp 97.5°F | Resp 20 | Ht 63.0 in | Wt 133.4 lb

## 2018-09-24 DIAGNOSIS — C3491 Malignant neoplasm of unspecified part of right bronchus or lung: Secondary | ICD-10-CM

## 2018-09-24 DIAGNOSIS — K219 Gastro-esophageal reflux disease without esophagitis: Secondary | ICD-10-CM | POA: Diagnosis not present

## 2018-09-24 DIAGNOSIS — E1165 Type 2 diabetes mellitus with hyperglycemia: Secondary | ICD-10-CM | POA: Diagnosis not present

## 2018-09-24 DIAGNOSIS — E1140 Type 2 diabetes mellitus with diabetic neuropathy, unspecified: Secondary | ICD-10-CM | POA: Insufficient documentation

## 2018-09-24 DIAGNOSIS — Z79899 Other long term (current) drug therapy: Secondary | ICD-10-CM | POA: Diagnosis not present

## 2018-09-24 DIAGNOSIS — E785 Hyperlipidemia, unspecified: Secondary | ICD-10-CM | POA: Insufficient documentation

## 2018-09-24 DIAGNOSIS — Z5112 Encounter for antineoplastic immunotherapy: Secondary | ICD-10-CM

## 2018-09-24 DIAGNOSIS — I10 Essential (primary) hypertension: Secondary | ICD-10-CM | POA: Insufficient documentation

## 2018-09-24 DIAGNOSIS — Z9221 Personal history of antineoplastic chemotherapy: Secondary | ICD-10-CM | POA: Insufficient documentation

## 2018-09-24 DIAGNOSIS — Z7982 Long term (current) use of aspirin: Secondary | ICD-10-CM | POA: Insufficient documentation

## 2018-09-24 DIAGNOSIS — A0472 Enterocolitis due to Clostridium difficile, not specified as recurrent: Secondary | ICD-10-CM | POA: Diagnosis not present

## 2018-09-24 DIAGNOSIS — Z923 Personal history of irradiation: Secondary | ICD-10-CM | POA: Insufficient documentation

## 2018-09-24 DIAGNOSIS — R011 Cardiac murmur, unspecified: Secondary | ICD-10-CM | POA: Insufficient documentation

## 2018-09-24 DIAGNOSIS — Z8673 Personal history of transient ischemic attack (TIA), and cerebral infarction without residual deficits: Secondary | ICD-10-CM | POA: Diagnosis not present

## 2018-09-24 LAB — CBC WITH DIFFERENTIAL (CANCER CENTER ONLY)
Abs Immature Granulocytes: 0.03 10*3/uL (ref 0.00–0.07)
BASOS ABS: 0 10*3/uL (ref 0.0–0.1)
Basophils Relative: 0 %
Eosinophils Absolute: 0.4 10*3/uL (ref 0.0–0.5)
Eosinophils Relative: 4 %
HCT: 37.5 % (ref 36.0–46.0)
Hemoglobin: 11.8 g/dL — ABNORMAL LOW (ref 12.0–15.0)
Immature Granulocytes: 0 %
Lymphocytes Relative: 15 %
Lymphs Abs: 1.5 10*3/uL (ref 0.7–4.0)
MCH: 28.4 pg (ref 26.0–34.0)
MCHC: 31.5 g/dL (ref 30.0–36.0)
MCV: 90.4 fL (ref 80.0–100.0)
Monocytes Absolute: 0.8 10*3/uL (ref 0.1–1.0)
Monocytes Relative: 8 %
NRBC: 0 % (ref 0.0–0.2)
Neutro Abs: 7.1 10*3/uL (ref 1.7–7.7)
Neutrophils Relative %: 73 %
PLATELETS: 349 10*3/uL (ref 150–400)
RBC: 4.15 MIL/uL (ref 3.87–5.11)
RDW: 20.6 % — ABNORMAL HIGH (ref 11.5–15.5)
WBC: 9.9 10*3/uL (ref 4.0–10.5)

## 2018-09-24 LAB — CMP (CANCER CENTER ONLY)
ALT: 9 U/L (ref 0–44)
AST: 13 U/L — ABNORMAL LOW (ref 15–41)
Albumin: 3.5 g/dL (ref 3.5–5.0)
Alkaline Phosphatase: 78 U/L (ref 38–126)
Anion gap: 9 (ref 5–15)
BUN: 18 mg/dL (ref 8–23)
CO2: 30 mmol/L (ref 22–32)
Calcium: 9.9 mg/dL (ref 8.9–10.3)
Chloride: 101 mmol/L (ref 98–111)
Creatinine: 0.94 mg/dL (ref 0.44–1.00)
GFR, Estimated: 59 mL/min — ABNORMAL LOW (ref >60)
Glucose, Bld: 224 mg/dL — ABNORMAL HIGH (ref 70–99)
Potassium: 4.4 mmol/L (ref 3.5–5.1)
Sodium: 140 mmol/L (ref 135–145)
Total Bilirubin: 0.6 mg/dL (ref 0.3–1.2)
Total Protein: 7 g/dL (ref 6.5–8.1)

## 2018-09-24 MED ORDER — HEPARIN SOD (PORK) LOCK FLUSH 100 UNIT/ML IV SOLN
500.0000 [IU] | Freq: Once | INTRAVENOUS | Status: AC | PRN
  Administered 2018-09-24: 500 [IU]
  Filled 2018-09-24: qty 5

## 2018-09-24 MED ORDER — SODIUM CHLORIDE 0.9 % IV SOLN
Freq: Once | INTRAVENOUS | Status: AC
  Administered 2018-09-24: 16:00:00 via INTRAVENOUS
  Filled 2018-09-24: qty 250

## 2018-09-24 MED ORDER — SODIUM CHLORIDE 0.9 % IV SOLN
620.0000 mg | Freq: Once | INTRAVENOUS | Status: AC
  Administered 2018-09-24: 620 mg via INTRAVENOUS
  Filled 2018-09-24: qty 2.4

## 2018-09-24 MED ORDER — SODIUM CHLORIDE 0.9% FLUSH
10.0000 mL | INTRAVENOUS | Status: DC | PRN
  Administered 2018-09-24: 10 mL
  Filled 2018-09-24: qty 10

## 2018-09-24 NOTE — Patient Instructions (Addendum)
[facility] Discharge Instructions for Patients Receiving Chemotherapy  Today you received the following chemotherapy agents: Imfinzi.  To help prevent nausea and vomiting after your treatment, we encourage you to take your nausea medication as directed.   If you develop nausea and vomiting that is not controlled by your nausea medication, call the clinic.   BELOW ARE SYMPTOMS THAT SHOULD BE REPORTED IMMEDIATELY:  *FEVER GREATER THAN 100.5 F  *CHILLS WITH OR WITHOUT FEVER  NAUSEA AND VOMITING THAT IS NOT CONTROLLED WITH YOUR NAUSEA MEDICATION  *UNUSUAL SHORTNESS OF BREATH  *UNUSUAL BRUISING OR BLEEDING  TENDERNESS IN MOUTH AND THROAT WITH OR WITHOUT PRESENCE OF ULCERS  *URINARY PROBLEMS  *BOWEL PROBLEMS  UNUSUAL RASH Items with * indicate a potential emergency and should be followed up as soon as possible.  Feel free to call the clinic should you have any questions or concerns. The clinic phone number is [phone].  Please show the CHEMO ALERT CARD at check-in to the Emergency Department and triage nurse.   

## 2018-09-24 NOTE — Progress Notes (Signed)
[place] OFFICE PROGRESS NOTE  [name], [place] [address]  DIAGNOSIS: Recurrent non-small cell lung cancer likely squamous cell carcinoma that was initially diagnosed as a stage IIA(T2b, N0, M0) inAugust 2018status post curative stereotactic radiotherapy completed June 21, 2017. The patient presented today with concerning findings for disease recurrence and progression with enlarging and hypermetabolic right upper lobe lung mass presenting as stage IIIa (T3, N0, M0).  PDL 1 expression 0%  PRIOR THERAPY: 1) Curative stereotactic radiotherapy completed on June 21, 2017. 2) A course of concurrent chemoradiation with chemotherapy consisting of carboplatin for an AUC of 2 and paclitaxel 45 mg/m.First dose started on 06/23/2018.Status post 7 cycles.  Last dose of chemotherapy was given August 04, 2018.  CURRENT THERAPY:Consolidation immunotherapy with Imfinzi 10 mg/KG every 2 weeks.  First dose September 09, 2018. Status post 1 cycle   INTERVAL HISTORY: [name] 78 y.o. female returns to the clinic today for a follow-up visit accompanied by her daughter.  The patient is feeling well today without any concerning complaints.  She recently had a Port-A-Cath placed and tolerated it well except for nausea from the anesthesia.  Otherwise she tolerated her first treatment of Imfinzi well without any adverse effects.  She denies any fever, chills, night sweats, or weight loss.  She denies any chest pain, shortness of breath, cough, or hemoptysis.  She denied any nausea, vomiting, diarrhea, or constipation.  She denies any headache or visual changes.  She denies any rashes or skin changes.  She is here for evaluation prior to starting cycle #2 today  MEDICAL HISTORY: Past Medical History:  Diagnosis Date  . Cancer ([place])    Lung   . Carotid artery occlusion   . Clostridium difficile infection 06/2013  . Diabetes mellitus    takes  Janumet daily  . Dyslipidemia    takes Crestor daily  . Gallstones   . GERD (gastroesophageal reflux disease)   . Heart murmur    hx of  . History of radiation therapy 06/11/17-06/21/17   right lung 50 Gy in 5 fractions  . Hypertension    takes Prinzide and Verapamil daily  . Impaired speech    from stroke  . Neuropathy, diabetic ([place])   . Pneumonia   . PONV (postoperative nausea and vomiting)   . Seizures ([place]) 04/19/2018   recent hospitalization  . Smoker   . Stroke ([place]) 06/25/13  . Vertigo    but doesn't take any meds    ALLERGIES:  is allergic to codeine; benadryl [diphenhydramine]; lipitor [atorvastatin]; and phenergan [promethazine hcl].  MEDICATIONS:  Current Outpatient Medications  Medication Sig Dispense Refill  . aspirin EC 81 MG tablet Take 81 mg by mouth daily.    . Blood Glucose Monitoring Suppl (ONE TOUCH ULTRA 2) w/Device KIT 1 Device by Does not apply route 2 (two) times daily. 1 each 0  . clopidogrel (PLAVIX) 75 MG tablet TAKE 1 TABLET BY MOUTH  DAILY (Patient taking differently: Take 75 mg by mouth daily. ) 90 tablet 3  . divalproex (DEPAKOTE ER) 500 MG 24 hr tablet TAKE 1 TABLET(500 MG) BY MOUTH DAILY 30 tablet 3  . glucose blood (ONE TOUCH ULTRA TEST) test strip Use as instructed 100 each 12  . Lancets (ONETOUCH ULTRASOFT) lancets Use as instructed 100 each 12  . lidocaine-prilocaine (EMLA) cream Apply 1 application topically as needed. 30 g 0  . lisinopril (PRINIVIL,ZESTRIL) 10 MG tablet Take 1 tablet (10 mg  total) by mouth daily. 90 tablet 3  . lisinopril-hydrochlorothiazide (PRINZIDE,ZESTORETIC) 20-12.5 MG tablet TAKE 1 TABLET BY MOUTH  DAILY 90 tablet 1  . Multiple Vitamins-Minerals (MULTIVITAMIN PO) Take 1 tablet by mouth daily.    . rosuvastatin (CRESTOR) 20 MG tablet TAKE 1 TABLET BY MOUTH ONCE DAILY (Patient taking differently: Take 20 mg by mouth daily. ) 180 tablet 0  . SitaGLIPtin-MetFORMIN HCl (JANUMET XR) 50-1000 MG TB24 Take 1 tablet by mouth  daily. 28 tablet 0  . vitamin B-12 (CYANOCOBALAMIN) 1000 MCG tablet Take 1,000 mcg by mouth daily.    Marland Kitchen ALPRAZolam (XANAX) 0.25 MG tablet Take 1 tablet (0.25 mg total) by mouth every 8 (eight) hours as needed for anxiety (1-2 tablets). (Patient not taking: Reported on 08/12/2018) 6 tablet 0  . prochlorperazine (COMPAZINE) 10 MG tablet Take 1 tablet (10 mg total) by mouth every 6 (six) hours as needed for nausea or vomiting. (Patient not taking: Reported on 09/01/2018) 30 tablet 0   No current facility-administered medications for this visit.    Facility-Administered Medications Ordered in Other Visits  Medication Dose Route Frequency Provider Last Rate Last Dose  . 0.9 %  sodium chloride infusion   Intravenous Once Curt Bears, MD      . durvalumab (IMFINZI) 600 mg in sodium chloride 0.9 % 100 mL chemo infusion  10 mg/kg (Treatment Plan Recorded) Intravenous Once Curt Bears, MD      . heparin lock flush 100 unit/mL  500 Units Intracatheter Once PRN Curt Bears, MD      . sodium chloride flush (NS) 0.9 % injection 10 mL  10 mL Intracatheter PRN Curt Bears, MD        SURGICAL HISTORY:  Past Surgical History:  Procedure Laterality Date  . cataract removed Right   . CHOLECYSTECTOMY    . COLONOSCOPY    . ENDARTERECTOMY Left 07/14/2013   Procedure: ENDARTERECTOMY CAROTID-LEFT;  Surgeon: Elam Dutch, MD;  Location: [facility];  Service: Vascular;  Laterality: Left;  . EYE SURGERY Left    cataract  . IR IMAGING GUIDED PORT INSERTION  09/15/2018  . IR PERC PLEURAL DRAIN W/INDWELL CATH W/IMG GUIDE  05/23/2017  . KNEE SURGERY Left 53yr ago  . LAPAROSCOPIC PARTIAL COLECTOMY N/A 09/02/2015   Procedure: LAPAROSCOPIC PARTIAL RIGHT COLECTOMY;  Surgeon: ALeighton Ruff MD;  Location: [facility];  Service: General;  Laterality: N/A;  . PATCH ANGIOPLASTY Left 07/14/2013   Procedure: LEFT CAROTID ARTERY PATCH ANGIOPLASTY;  Surgeon: CElam Dutch MD;  [place] [facility]  Service: Vascular;   Laterality: Left;  .Marland KitchenVIDEO BRONCHOSCOPY N/A 06/04/2018   Procedure: VIDEO BRONCHOSCOPY;  Surgeon: HMelrose Nakayama MD;  [facility];  Service: Thoracic;  Laterality: N/A;  . wisdom      REVIEW OF SYSTEMS:   Review of Systems  Constitutional: Negative for appetite change, chills, fatigue, fever and unexpected weight change.  HENT:   Negative for mouth sores, nosebleeds, sore throat and trouble swallowing.   Eyes: Negative for eye problems and icterus.  Respiratory: Negative for cough, hemoptysis, shortness of breath and wheezing.   Cardiovascular: Negative for chest pain and leg swelling.  Gastrointestinal: Negative for abdominal pain, constipation, diarrhea, nausea and vomiting.  Genitourinary: Negative for bladder incontinence, difficulty urinating, dysuria, frequency and hematuria.   Musculoskeletal: Negative for back pain, gait problem, neck pain and neck stiffness.  Skin: Negative for itching and rash.  Neurological: Negative for dizziness, extremity weakness, gait problem, headaches, light-headedness and seizures.  Hematological: Negative  for adenopathy. Does not bruise/bleed easily.  Psychiatric/Behavioral: Negative for confusion, depression and sleep disturbance. The patient is not nervous/anxious.     PHYSICAL EXAMINATION:  Blood pressure (!) 157/69, pulse 92, temperature (!) 97.5 F (36.4 C), temperature source Oral, resp. rate 20, height '5\' 3"'  (1.6 m), weight 133 lb 6.4 oz (60.5 kg), SpO2 100 %.  ECOG PERFORMANCE STATUS: 1 - Symptomatic but completely ambulatory  Physical Exam  Constitutional: Oriented to person, place, and time and well-developed, well-nourished, and in no distress. No distress.  HENT:  Head: Normocephalic and atraumatic.  Mouth/Throat: Oropharynx is clear and moist. No oropharyngeal exudate.  Eyes: Conjunctivae are normal. Right eye exhibits no discharge. Left eye exhibits no discharge. No scleral icterus.  Neck: Normal range of motion. Neck  supple.  Cardiovascular: Normal rate, regular rhythm, no murmurs and intact distal pulses.  Pulmonary/Chest: Effort normal and breath sounds normal. No respiratory distress. No wheezes. No rales.  Abdominal: Soft. Bowel sounds are normal. Exhibits no distension and no mass. There is no tenderness.  Musculoskeletal: Normal range of motion. Exhibits no edema.  Lymphadenopathy:    No cervical adenopathy.  Neurological: Alert and oriented to person, place, and time. Exhibits normal muscle tone. Gait normal. Coordination normal.  Skin: Skin is warm and dry. No rash noted. Not diaphoretic. No erythema. No pallor.  Psychiatric: Mood, memory and judgment normal.  Vitals reviewed.  LABORATORY DATA: Lab Results  Component Value Date   WBC 9.9 09/24/2018   HGB 11.8 (L) 09/24/2018   HCT 37.5 09/24/2018   MCV 90.4 09/24/2018   PLT 349 09/24/2018      Chemistry      Component Value Date/Time   NA 140 09/24/2018 1429   NA 139 05/07/2018 1147   K 4.4 09/24/2018 1429   CL 101 09/24/2018 1429   CO2 30 09/24/2018 1429   BUN 18 09/24/2018 1429   BUN 15 05/07/2018 1147   CREATININE 0.94 09/24/2018 1429   CREATININE 0.87 03/08/2015 0001      Component Value Date/Time   CALCIUM 9.9 09/24/2018 1429   ALKPHOS 78 09/24/2018 1429   AST 13 (L) 09/24/2018 1429   ALT 9 09/24/2018 1429   BILITOT 0.6 09/24/2018 1429       RADIOGRAPHIC STUDIES:  Ct Chest W Contrast  Result Date: 09/01/2018 CLINICAL DATA:  Non-small cell lung cancer. Chemotherapy and radiation therapy complete. EXAM: CT CHEST WITH CONTRAST TECHNIQUE: Multidetector CT imaging of the chest was performed during intravenous contrast administration. CONTRAST:  17m OMNIPAQUE IOHEXOL 300 MG/ML  SOLN COMPARISON:  PET 05/07/2018 and CT chest 02/27/2018. FINDINGS: Cardiovascular: Atherosclerotic calcification of the aorta and coronary arteries. Heart size normal. No pericardial effusion. Mediastinum/Nodes: No pathologically enlarged  mediastinal, hilar or axillary lymph nodes. Esophagus is grossly unremarkable. Lungs/Pleura: Image quality is somewhat degraded by respiratory motion. A necrotic mass in the right upper lobe measures 3.7 x 3.8 cm (series 2, image 51), previously 4.6 x 5.6 cm when remeasured on the prior exam. Narrowing/obstruction of right upper lobe bronchi with associated collapse/consolidation, minimally improved from 05/07/2018. Small loculated right pleural effusion, increased from 05/07/2018. Left lung is clear. Airway is otherwise unremarkable. Upper Abdomen: Liver margin is slightly irregular. Visualized portions of the liver and right adrenal gland are unremarkable. Slight nodular thickening of the left adrenal gland, unchanged. Scarring in the upper pole right kidney. Low-attenuation lesions in the upper pole left kidney measure up to 1.9 cm and are likely cysts. Low-attenuation lesion along the periphery of the  spleen measures 1.3 cm and is unchanged from multiple prior exams, indicative of a benign lesion. Visualized portions of the pancreas, stomach and bowel are grossly unremarkable. Cholecystectomy. Midline ventral hernia contains omental fat, incompletely imaged. No upper abdominal adenopathy. Musculoskeletal: Degenerative changes in the spine. No worrisome lytic or sclerotic lesions. IMPRESSION: 1. Decrease in size of a right upper lobe necrotic mass, with associated narrowing/obstruction of right upper lobe bronchi. Slight improvement in postobstructive pneumonitis in the right upper lobe. 2. No evidence of distant metastatic disease. 3. Small loculated right pleural effusion, increased. 4. Marginal irregularity of the liver is indicative of cirrhosis. 5. Midline ventral hernia contains omental. 6. Aortic atherosclerosis (ICD10-170.0). Coronary artery calcification. Electronically Signed   By: Lorin Picket M.D.   On: 09/01/2018 08:36   Ir Imaging Guided Port Insertion  Result Date: 09/15/2018 INDICATION:  78 year old female with non-small cell lung cancer on the right. She requires durable venous access for ongoing immunotherapy. She presents for port catheter placement. EXAM: IMPLANTED PORT A CATH PLACEMENT WITH ULTRASOUND AND FLUOROSCOPIC GUIDANCE MEDICATIONS: 2 g Ancef; The antibiotic was administered within an appropriate time interval prior to skin puncture. ANESTHESIA/SEDATION: Versed 2 mg IV; Fentanyl 100 mcg IV; Moderate Sedation Time:  24 minutes The patient was continuously monitored during the procedure by the interventional radiology nurse under my direct supervision. FLUOROSCOPY TIME:  0 minutes, 12 seconds (1 mGy) COMPLICATIONS: None immediate. PROCEDURE: The right neck and chest was prepped with chlorhexidine, and draped in the usual sterile fashion using maximum barrier technique (cap and mask, sterile gown, sterile gloves, large sterile sheet, hand hygiene and cutaneous antiseptic). Local anesthesia was attained by infiltration with 1% lidocaine with epinephrine. Ultrasound demonstrated patency of the right internal jugular vein, and this was documented with an image. Under real-time ultrasound guidance, this vein was accessed with a 21 gauge micropuncture needle and image documentation was performed. A small dermatotomy was made at the access site with an 11 scalpel. A 0.018" wire was advanced into the SVC and the access needle exchanged for a 33F micropuncture vascular sheath. The 0.018" wire was then removed and a 0.035" wire advanced into the IVC. An appropriate location for the subcutaneous reservoir was selected below the clavicle and an incision was made through the skin and underlying soft tissues. The subcutaneous tissues were then dissected using a combination of blunt and sharp surgical technique and a pocket was formed. A single lumen power injectable portacatheter was then tunneled through the subcutaneous tissues from the pocket to the dermatotomy and the port reservoir placed within the  subcutaneous pocket. The venous access site was then serially dilated and a peel away vascular sheath placed over the wire. The wire was removed and the port catheter advanced into position under fluoroscopic guidance. The catheter tip is positioned in the superior cavoatrial junction. This was documented with a spot image. The portacatheter was then tested and found to flush and aspirate well. The port was flushed with saline followed by 100 units/mL heparinized saline. The pocket was then closed in two layers using first subdermal inverted interrupted absorbable sutures followed by a running subcuticular suture. The epidermis was then sealed with Dermabond. The dermatotomy at the venous access site was also closed with Dermabond. IMPRESSION: Successful placement of a right IJ approach Power Port with ultrasound and fluoroscopic guidance. The catheter is ready for use. Electronically Signed   By: Jacqulynn Cadet M.D.   On: 09/15/2018 15:10     ASSESSMENT/PLAN:  This is a  very pleasant 78 year old Caucasian female with recurrent IIIA non-small cell lung cancer, squamous cell carcinoma who presented initially as a stage IIa.  The patient underwent a course of concurrent chemoradiation with weekly carboplatin and paclitaxel.  She status post 7 cycles.  She tolerated treatment well.  She is currently undergoing consolidation immunotherapy with Imfinzi 10 mg/kg IV every 2 weeks. She is status post 1 cycle The patient was seen with Dr. Julien Nordmann today.  The patient tolerated treatment well without any adverse side effects. Labs were reviewed with the patient and her daughter. I recommend she proceed with cycle #2 today as scheduled.  See her back for follow-up visit in 2 weeks before starting cycle #3.   The patient's blood sugar was elevated today at 224. Additionally, her blood pressure continues to be elevated at 157/69 today. She has an appointment with her PCP next week. She will continue with her  appointment for management of her diabetes and blood pressure.  The patient was advised to call immediately if she has any concerning symptoms in the interval. The patient voices understanding of current disease status and treatment options and is in agreement with the current care plan. All questions were answered. The patient knows to call the clinic with any problems, questions or concerns. We can certainly see the patient much sooner if necessary  No orders of the defined types were placed in this encounter.     L , PA-C 09/24/18  ADDENDUM: Hematology/Oncology Attending: I had a face-to-face encounter with the patient today.  I recommended her care plan.  This is a very pleasant 78 years old white female with a stage IIIa non-small cell lung cancer, squamous cell carcinoma status post concurrent chemoradiation with weekly carboplatin and paclitaxel with partial response.  The patient is currently undergoing treatment with immunotherapy with Imfinzi status post 1 cycle.  She tolerated the first cycle of this treatment well with no concerning complaints. I recommended for the patient to proceed with cycle #2 today as scheduled. We will continue to monitor her closely during her treatment and she will come back for follow-up visit in 2 weeks for evaluation before starting cycle #3. She was advised to call immediately if she has any concerning symptoms in the interval.  Disclaimer: This note was dictated with voice recognition software. Similar sounding words can inadvertently be transcribed and may be missed upon review. Eilleen Kempf, MD 09/24/18

## 2018-09-24 NOTE — Research (Signed)
Human Biospecimens for the Discovery and Validation of Biomarkers for the Prediction, Diagnosis and Management of Disease  [id number] - [id number] PI: Bobbye Riggs, MD Attending:  Visit: Consent  X Study was introduced by the attending physician.  X Patient had the opportunity to ask questions.  X Consenting personnel has reviewed consent in entirety with patient.  Discussion included, but not limited to: protocol expectations/evaluations, side effects, risks and benefits of the procedures associated with the study.  X Patient verbalizes good understanding of all instructions and information in consent, including the voluntary nature of participation, and agrees to comply with all protocol requirements.  X The patient wishes to participate in this clinical trial and willingly signed the consent document.  X Copy of signed consent was given to the patient.  The original was scanned to medial records and the original will be placed in the subject's research chart.  X No study-specific procedures were done prior to consent.  Any study specific tests/procedures will now be scheduled and completed per protocol for screening purposes.  X  The patient is aware that their insurance will not be billed as this is not a pharmaceutical trial and we are usually just collecting labs at an already scheduled lab visit.    X Patient signed the ICF (IRB approved 04/26/2017), HIPPA (IRB approved 02/01/2017) forms on 09/24/2018 at 2:50 pm  X The patient was provided with the contact information for the PI and the IRB if they have any research related questions  X The PI was notified that the patient consented to the study   Patient will provide blood on 10/08/2018 to complete study enrollment.  A VISA debit card will be provided as specified by the protocol.

## 2018-09-25 ENCOUNTER — Telehealth: Payer: Self-pay | Admitting: Internal Medicine

## 2018-09-25 NOTE — Telephone Encounter (Signed)
Added port to labs 3/25 and 4/7. Also added f/u 4/22. Confirmed with dtr. Patient will get updated schedule at next visit.

## 2018-10-02 ENCOUNTER — Ambulatory Visit: Payer: Medicare Other | Admitting: Family Medicine

## 2018-10-08 ENCOUNTER — Inpatient Hospital Stay: Payer: Medicare Other

## 2018-10-08 ENCOUNTER — Telehealth: Payer: Self-pay | Admitting: Family Medicine

## 2018-10-08 ENCOUNTER — Other Ambulatory Visit (INDEPENDENT_AMBULATORY_CARE_PROVIDER_SITE_OTHER): Payer: Medicare Other

## 2018-10-08 ENCOUNTER — Telehealth: Payer: Self-pay

## 2018-10-08 ENCOUNTER — Inpatient Hospital Stay (HOSPITAL_BASED_OUTPATIENT_CLINIC_OR_DEPARTMENT_OTHER): Payer: Medicare Other | Admitting: Physician Assistant

## 2018-10-08 ENCOUNTER — Encounter: Payer: Self-pay | Admitting: Physician Assistant

## 2018-10-08 ENCOUNTER — Other Ambulatory Visit: Payer: Self-pay

## 2018-10-08 ENCOUNTER — Encounter: Payer: Self-pay | Admitting: *Deleted

## 2018-10-08 VITALS — BP 157/63 | HR 84 | Temp 98.0°F | Resp 17 | Ht 63.0 in | Wt 134.9 lb

## 2018-10-08 DIAGNOSIS — C3491 Malignant neoplasm of unspecified part of right bronchus or lung: Secondary | ICD-10-CM | POA: Diagnosis not present

## 2018-10-08 DIAGNOSIS — E1165 Type 2 diabetes mellitus with hyperglycemia: Secondary | ICD-10-CM | POA: Diagnosis not present

## 2018-10-08 DIAGNOSIS — Z923 Personal history of irradiation: Secondary | ICD-10-CM | POA: Diagnosis not present

## 2018-10-08 DIAGNOSIS — I10 Essential (primary) hypertension: Secondary | ICD-10-CM | POA: Diagnosis not present

## 2018-10-08 DIAGNOSIS — E538 Deficiency of other specified B group vitamins: Secondary | ICD-10-CM | POA: Diagnosis not present

## 2018-10-08 DIAGNOSIS — Z5112 Encounter for antineoplastic immunotherapy: Secondary | ICD-10-CM

## 2018-10-08 DIAGNOSIS — E785 Hyperlipidemia, unspecified: Secondary | ICD-10-CM | POA: Diagnosis not present

## 2018-10-08 DIAGNOSIS — K219 Gastro-esophageal reflux disease without esophagitis: Secondary | ICD-10-CM | POA: Diagnosis not present

## 2018-10-08 DIAGNOSIS — Z9221 Personal history of antineoplastic chemotherapy: Secondary | ICD-10-CM | POA: Diagnosis not present

## 2018-10-08 DIAGNOSIS — Z95828 Presence of other vascular implants and grafts: Secondary | ICD-10-CM

## 2018-10-08 DIAGNOSIS — A0472 Enterocolitis due to Clostridium difficile, not specified as recurrent: Secondary | ICD-10-CM | POA: Diagnosis not present

## 2018-10-08 DIAGNOSIS — Z7982 Long term (current) use of aspirin: Secondary | ICD-10-CM | POA: Diagnosis not present

## 2018-10-08 DIAGNOSIS — R011 Cardiac murmur, unspecified: Secondary | ICD-10-CM | POA: Diagnosis not present

## 2018-10-08 DIAGNOSIS — Z8673 Personal history of transient ischemic attack (TIA), and cerebral infarction without residual deficits: Secondary | ICD-10-CM | POA: Diagnosis not present

## 2018-10-08 DIAGNOSIS — E1140 Type 2 diabetes mellitus with diabetic neuropathy, unspecified: Secondary | ICD-10-CM | POA: Diagnosis not present

## 2018-10-08 DIAGNOSIS — Z79899 Other long term (current) drug therapy: Secondary | ICD-10-CM | POA: Diagnosis not present

## 2018-10-08 LAB — CMP (CANCER CENTER ONLY)
ALT: 11 U/L (ref 0–44)
AST: 12 U/L — ABNORMAL LOW (ref 15–41)
Albumin: 3.3 g/dL — ABNORMAL LOW (ref 3.5–5.0)
Alkaline Phosphatase: 69 U/L (ref 38–126)
Anion gap: 12 (ref 5–15)
BUN: 18 mg/dL (ref 8–23)
CO2: 27 mmol/L (ref 22–32)
Calcium: 9.3 mg/dL (ref 8.9–10.3)
Chloride: 101 mmol/L (ref 98–111)
Creatinine: 0.93 mg/dL (ref 0.44–1.00)
GFR, Estimated: 59 mL/min — ABNORMAL LOW (ref >60)
Glucose, Bld: 314 mg/dL — ABNORMAL HIGH (ref 70–99)
Potassium: 4.1 mmol/L (ref 3.5–5.1)
Sodium: 140 mmol/L (ref 135–145)
Total Bilirubin: 0.5 mg/dL (ref 0.3–1.2)
Total Protein: 6.4 g/dL — ABNORMAL LOW (ref 6.5–8.1)

## 2018-10-08 LAB — CBC WITH DIFFERENTIAL (CANCER CENTER ONLY)
Abs Immature Granulocytes: 0.03 10*3/uL (ref 0.00–0.07)
Basophils Absolute: 0.1 10*3/uL (ref 0.0–0.1)
Basophils Relative: 1 %
Eosinophils Absolute: 0.2 10*3/uL (ref 0.0–0.5)
Eosinophils Relative: 2 %
HCT: 36.1 % (ref 36.0–46.0)
Hemoglobin: 11.5 g/dL — ABNORMAL LOW (ref 12.0–15.0)
Immature Granulocytes: 0 %
Lymphocytes Relative: 14 %
Lymphs Abs: 1.5 10*3/uL (ref 0.7–4.0)
MCH: 28.7 pg (ref 26.0–34.0)
MCHC: 31.9 g/dL (ref 30.0–36.0)
MCV: 90 fL (ref 80.0–100.0)
MONO ABS: 0.7 10*3/uL (ref 0.1–1.0)
MONOS PCT: 6 %
Neutro Abs: 8.3 10*3/uL — ABNORMAL HIGH (ref 1.7–7.7)
Neutrophils Relative %: 77 %
Platelet Count: 336 10*3/uL (ref 150–400)
RBC: 4.01 MIL/uL (ref 3.87–5.11)
RDW: 18.6 % — ABNORMAL HIGH (ref 11.5–15.5)
WBC Count: 10.7 10*3/uL — ABNORMAL HIGH (ref 4.0–10.5)
nRBC: 0 % (ref 0.0–0.2)

## 2018-10-08 LAB — RESEARCH LABS

## 2018-10-08 LAB — TSH: TSH: 1.558 u[IU]/mL (ref 0.308–3.960)

## 2018-10-08 MED ORDER — CYANOCOBALAMIN 1000 MCG/ML IJ SOLN
1000.0000 ug | Freq: Once | INTRAMUSCULAR | Status: AC
  Administered 2018-10-08: 1000 ug via INTRAMUSCULAR

## 2018-10-08 MED ORDER — SODIUM CHLORIDE 0.9% FLUSH
10.0000 mL | INTRAVENOUS | Status: DC | PRN
  Administered 2018-10-08: 10 mL via INTRAVENOUS
  Filled 2018-10-08: qty 10

## 2018-10-08 MED ORDER — SODIUM CHLORIDE 0.9% FLUSH
10.0000 mL | INTRAVENOUS | Status: DC | PRN
  Administered 2018-10-08: 10 mL
  Filled 2018-10-08: qty 10

## 2018-10-08 MED ORDER — HEPARIN SOD (PORK) LOCK FLUSH 100 UNIT/ML IV SOLN
500.0000 [IU] | Freq: Once | INTRAVENOUS | Status: AC | PRN
  Administered 2018-10-08: 500 [IU]
  Filled 2018-10-08: qty 5

## 2018-10-08 MED ORDER — SODIUM CHLORIDE 0.9 % IV SOLN
Freq: Once | INTRAVENOUS | Status: AC
  Administered 2018-10-08: 12:00:00 via INTRAVENOUS
  Filled 2018-10-08: qty 250

## 2018-10-08 MED ORDER — SODIUM CHLORIDE 0.9 % IV SOLN
10.2000 mg/kg | Freq: Once | INTRAVENOUS | Status: AC
  Administered 2018-10-08: 620 mg via INTRAVENOUS
  Filled 2018-10-08: qty 10

## 2018-10-08 NOTE — Telephone Encounter (Signed)
The prednisone from January should really not affect her today

## 2018-10-08 NOTE — Progress Notes (Signed)
[name] OFFICE PROGRESS NOTE  [name], [place] [address]  DIAGNOSIS: Recurrent non-small cell lung cancer likely squamous cell carcinoma that was initially diagnosed as a stage IIA(T2b, N0, M0) inAugust 2018status post curative stereotactic radiotherapy completed June 21, 2017. The patient presented today with concerning findings for disease recurrence and progression with enlarging and hypermetabolic right upper lobe lung mass presenting as stage IIIa (T3, N0, M0).  PDL 1 expression 0%  PRIOR THERAPY: 1)Curative stereotactic radiotherapy completed on June 21, 2017. 2)A course of concurrent chemoradiation with chemotherapy consisting of carboplatin for an AUC of 2 and paclitaxel 45 mg/m.First dose started on 06/23/2018.Status post7cycles.Last dose of chemotherapy was given August 04, 2018.  CURRENT THERAPY: Consolidation immunotherapy with Imfinzi 10 mg/KG every 2 weeks. First dose September 09, 2018. Status post 2 cycle   INTERVAL HISTORY: [name] 78 y.o. female returns to the clinic for a follow up visit. The patient is feeling well today without any concerning complaints. She continues to tolerate her treatment with Imfinzi well without any adverse side effects.  She denies any fever, chills, night sweats, or weight loss.  She denies any chest pain, shortness of breath, cough, or hemoptysis.  She denies any nausea, vomiting, diarrhea, or constipation.  She denies any headache or visual changes.  She denies any rashes or skin changes.  She is here today for evaluation prior to starting cycle #3 of her treatment today.   MEDICAL HISTORY: Past Medical History:  Diagnosis Date  . Cancer ([place])    Lung   . Carotid artery occlusion   . Clostridium difficile infection 06/2013  . Diabetes mellitus    takes Janumet daily  . Dyslipidemia    takes Crestor daily  . Gallstones   . GERD (gastroesophageal reflux  disease)   . Heart murmur    hx of  . History of radiation therapy 06/11/17-06/21/17   right lung 50 Gy in 5 fractions  . Hypertension    takes Prinzide and Verapamil daily  . Impaired speech    from stroke  . Neuropathy, diabetic ([place])   . Pneumonia   . PONV (postoperative nausea and vomiting)   . Seizures ([place]) 04/19/2018   recent hospitalization  . Smoker   . Stroke ([place]) 06/25/13  . Vertigo    but doesn't take any meds    ALLERGIES:  is allergic to codeine; benadryl [diphenhydramine]; lipitor [atorvastatin]; and phenergan [promethazine hcl].  MEDICATIONS:  Current Outpatient Medications  Medication Sig Dispense Refill  . aspirin EC 81 MG tablet Take 81 mg by mouth daily.    . Blood Glucose Monitoring Suppl (ONE TOUCH ULTRA 2) w/Device KIT 1 Device by Does not apply route 2 (two) times daily. 1 each 0  . clopidogrel (PLAVIX) 75 MG tablet TAKE 1 TABLET BY MOUTH  DAILY (Patient taking differently: Take 75 mg by mouth daily. ) 90 tablet 3  . divalproex (DEPAKOTE ER) 500 MG 24 hr tablet TAKE 1 TABLET(500 MG) BY MOUTH DAILY 30 tablet 3  . glucose blood (ONE TOUCH ULTRA TEST) test strip Use as instructed 100 each 12  . Lancets (ONETOUCH ULTRASOFT) lancets Use as instructed 100 each 12  . lidocaine-prilocaine (EMLA) cream Apply 1 application topically as needed. 30 g 0  . lisinopril (PRINIVIL,ZESTRIL) 10 MG tablet Take 1 tablet (10 mg total) by mouth daily. 90 tablet 3  . lisinopril-hydrochlorothiazide (PRINZIDE,ZESTORETIC) 20-12.5 MG tablet TAKE 1 TABLET BY MOUTH  DAILY 90 tablet  1  . Multiple Vitamins-Minerals (MULTIVITAMIN PO) Take 1 tablet by mouth daily.    . rosuvastatin (CRESTOR) 20 MG tablet TAKE 1 TABLET BY MOUTH ONCE DAILY (Patient taking differently: Take 20 mg by mouth daily. ) 180 tablet 0  . SitaGLIPtin-MetFORMIN HCl (JANUMET XR) 50-1000 MG TB24 Take 1 tablet by mouth daily. 28 tablet 0  . vitamin B-12 (CYANOCOBALAMIN) 1000 MCG tablet Take 1,000 mcg by mouth daily.    Marland Kitchen  ALPRAZolam (XANAX) 0.25 MG tablet Take 1 tablet (0.25 mg total) by mouth every 8 (eight) hours as needed for anxiety (1-2 tablets). (Patient not taking: Reported on 08/12/2018) 6 tablet 0  . prochlorperazine (COMPAZINE) 10 MG tablet Take 1 tablet (10 mg total) by mouth every 6 (six) hours as needed for nausea or vomiting. (Patient not taking: Reported on 09/01/2018) 30 tablet 0   No current facility-administered medications for this visit.     SURGICAL HISTORY:  Past Surgical History:  Procedure Laterality Date  . cataract removed Right   . CHOLECYSTECTOMY    . COLONOSCOPY    . ENDARTERECTOMY Left 07/14/2013   Procedure: ENDARTERECTOMY CAROTID-LEFT;  Surgeon: Elam Dutch, MD;  Location: [facility];  Service: Vascular;  Laterality: Left;  . EYE SURGERY Left    cataract  . IR IMAGING GUIDED PORT INSERTION  09/15/2018  . IR PERC PLEURAL DRAIN W/INDWELL CATH W/IMG GUIDE  05/23/2017  . KNEE SURGERY Left 29yr ago  . LAPAROSCOPIC PARTIAL COLECTOMY N/A 09/02/2015   Procedure: LAPAROSCOPIC PARTIAL RIGHT COLECTOMY;  Surgeon: ALeighton Ruff MD;  Location: [facility];  Service: General;  Laterality: N/A;  . PATCH ANGIOPLASTY Left 07/14/2013   Procedure: LEFT CAROTID ARTERY PATCH ANGIOPLASTY;  Surgeon: CElam Dutch MD;  [facility]  Service: Vascular;  Laterality: Left;  .Marland KitchenVIDEO BRONCHOSCOPY N/A 06/04/2018   Procedure: VIDEO BRONCHOSCOPY;  Surgeon: HMelrose Nakayama MD;  [facility];  Service: Thoracic;  Laterality: N/A;  . wisdom      REVIEW OF SYSTEMS:   Review of Systems  Constitutional: Negative for appetite change, chills, fatigue, fever and unexpected weight change.  HENT:   Negative for mouth sores, nosebleeds, sore throat and trouble swallowing.   Eyes: Negative for eye problems and icterus.  Respiratory: Negative for cough, hemoptysis, shortness of breath and wheezing.   Cardiovascular: Negative for chest pain and leg swelling.  Gastrointestinal: Negative for abdominal pain,  constipation, diarrhea, nausea and vomiting.  Genitourinary: Negative for bladder incontinence, difficulty urinating, dysuria, frequency and hematuria.   Musculoskeletal: Negative for back pain, gait problem, neck pain and neck stiffness.  Skin: Negative for itching and rash.  Neurological: Negative for dizziness, extremity weakness, gait problem, headaches, light-headedness and seizures.  Hematological: Negative for adenopathy. Does not bruise/bleed easily.  Psychiatric/Behavioral: Negative for confusion, depression and sleep disturbance. The patient is not nervous/anxious.     PHYSICAL EXAMINATION:  Blood pressure (!) 157/63, pulse 84, temperature 98 F (36.7 C), temperature source Oral, resp. rate 17, height 5' 3" (1.6 m), weight 134 lb 14.4 oz (61.2 kg), SpO2 100 %.  ECOG PERFORMANCE STATUS: 1 - Symptomatic but completely ambulatory  Physical Exam  Constitutional: Oriented to person, place, and time and well-developed, well-nourished, and in no distress. No distress.  HENT:  Head: Normocephalic and atraumatic.  Mouth/Throat: Oropharynx is clear and moist. No oropharyngeal exudate.  Eyes: Conjunctivae are normal. Right eye exhibits no discharge. Left eye exhibits no discharge. No scleral icterus.  Neck: Normal range of motion. Neck supple.  Cardiovascular: Normal rate, regular rhythm, normal heart sounds and intact distal pulses.   Pulmonary/Chest: Effort normal and breath sounds normal. No respiratory distress. No wheezes. No rales.  Abdominal: Soft. Bowel sounds are normal. Exhibits no distension and no mass. There is no tenderness.  Musculoskeletal: Normal range of motion. Exhibits no edema.  Lymphadenopathy:    No cervical adenopathy.  Neurological: Alert and oriented to person, place, and time. Exhibits normal muscle tone. Gait normal. Coordination normal.  Skin: Skin is warm and dry. No rash noted. Not diaphoretic. No erythema. No pallor.  Psychiatric: Mood, memory and judgment  normal.  Vitals reviewed.  LABORATORY DATA: Lab Results  Component Value Date   WBC 10.7 (H) 10/08/2018   HGB 11.5 (L) 10/08/2018   HCT 36.1 10/08/2018   MCV 90.0 10/08/2018   PLT 336 10/08/2018      Chemistry      Component Value Date/Time   NA 140 10/08/2018 1110   NA 139 05/07/2018 1147   K 4.1 10/08/2018 1110   CL 101 10/08/2018 1110   CO2 27 10/08/2018 1110   BUN 18 10/08/2018 1110   BUN 15 05/07/2018 1147   CREATININE 0.93 10/08/2018 1110   CREATININE 0.87 03/08/2015 0001      Component Value Date/Time   CALCIUM 9.3 10/08/2018 1110   ALKPHOS 69 10/08/2018 1110   AST 12 (L) 10/08/2018 1110   ALT 11 10/08/2018 1110   BILITOT 0.5 10/08/2018 1110       RADIOGRAPHIC STUDIES:  Ir Imaging Guided Port Insertion  Result Date: 09/15/2018 INDICATION: 78 year old female with non-small cell lung cancer on the right. She requires durable venous access for ongoing immunotherapy. She presents for port catheter placement. EXAM: IMPLANTED PORT A CATH PLACEMENT WITH ULTRASOUND AND FLUOROSCOPIC GUIDANCE MEDICATIONS: 2 g Ancef; The antibiotic was administered within an appropriate time interval prior to skin puncture. ANESTHESIA/SEDATION: Versed 2 mg IV; Fentanyl 100 mcg IV; Moderate Sedation Time:  24 minutes The patient was continuously monitored during the procedure by the interventional radiology nurse under my direct supervision. FLUOROSCOPY TIME:  0 minutes, 12 seconds (1 mGy) COMPLICATIONS: None immediate. PROCEDURE: The right neck and chest was prepped with chlorhexidine, and draped in the usual sterile fashion using maximum barrier technique (cap and mask, sterile gown, sterile gloves, large sterile sheet, hand hygiene and cutaneous antiseptic). Local anesthesia was attained by infiltration with 1% lidocaine with epinephrine. Ultrasound demonstrated patency of the right internal jugular vein, and this was documented with an image. Under real-time ultrasound guidance, this vein was  accessed with a 21 gauge micropuncture needle and image documentation was performed. A small dermatotomy was made at the access site with an 11 scalpel. A 0.018" wire was advanced into the SVC and the access needle exchanged for a 1F micropuncture vascular sheath. The 0.018" wire was then removed and a 0.035" wire advanced into the IVC. An appropriate location for the subcutaneous reservoir was selected below the clavicle and an incision was made through the skin and underlying soft tissues. The subcutaneous tissues were then dissected using a combination of blunt and sharp surgical technique and a pocket was formed. A single lumen power injectable portacatheter was then tunneled through the subcutaneous tissues from the pocket to the dermatotomy and the port reservoir placed within the subcutaneous pocket. The venous access site was then serially dilated and a peel away vascular sheath placed over the wire. The wire was removed and the port catheter advanced into position under fluoroscopic guidance. The catheter  tip is positioned in the superior cavoatrial junction. This was documented with a spot image. The portacatheter was then tested and found to flush and aspirate well. The port was flushed with saline followed by 100 units/mL heparinized saline. The pocket was then closed in two layers using first subdermal inverted interrupted absorbable sutures followed by a running subcuticular suture. The epidermis was then sealed with Dermabond. The dermatotomy at the venous access site was also closed with Dermabond. IMPRESSION: Successful placement of a right IJ approach Power Port with ultrasound and fluoroscopic guidance. The catheter is ready for use. Electronically Signed   By: Jacqulynn Cadet M.D.   On: 09/15/2018 15:10     ASSESSMENT/PLAN:  This is a very pleasant 78 year old Caucasian female with recurrent IIIA non-small cell lung cancer, squamous cell carcinoma of the right upper lobe who presented  initially as a stage IIa in August 2018.  The patient had underwent a course of concurrent chemoradiation with weekly carboplatin and paclitaxel.  She is status post 7 cycles.  She tolerated treatment well. She is currently undergoing consolidation immunotherapy with Imfinzi 10 mg/kg every 2 weeks for a total of 1 year unless the patient has evidence of disease progression or unacceptable toxicity.  She is status post 2 cycles.   The patient was seen with Dr. Julien Nordmann today.  The patient tolerated treatment well without any adverse effects.  Labs were reviewed with the patient.  I recommend that she proceed with cycle #3 today as scheduled. I will see her back for follow-up visit in 2 weeks before starting cycle #4.  The patient has an appointment at her PCP's office this afternoon, she was encouraged to discuss recommendations/modifications for optimal blood sugar and blood pressure management as her readings are always elevated at our clinic. Recent trends in glucose and BP readings were relayed to PCP's office.   The patient was advised to call immediately if she has any concerning symptoms in the interval. The patient voices understanding of current disease status and treatment options and is in agreement with the current care plan. All questions were answered. The patient knows to call the clinic with any problems, questions or concerns. We can certainly see the patient much sooner if necessary  No orders of the defined types were placed in this encounter.    Cassandra L Heilingoetter, PA-C 10/08/18   ADDENDUM: Hematology/Oncology Attending: I had a face-to-face encounter with the patient today.  I recommended her care plan.  This is a very pleasant 78 years old white female with recurrent stage IIIa non-small cell lung cancer, squamous cell carcinoma status post a course of concurrent chemoradiation with weekly carboplatin and paclitaxel with partial response.  The patient is currently  undergoing consolidation treatment with Imfinzi every 2 weeks status post 2 cycles.  She has been tolerating this treatment well with no concerning adverse effects. I recommended for the patient to proceed with cycle #3 today. She will come back for follow-up visit in 2 weeks for evaluation before starting cycle #4. The patient was advised to call immediately if she has any concerning symptoms in the interval.  Disclaimer: This note was dictated with voice recognition software. Similar sounding words can inadvertently be transcribed and may be missed upon review. Eilleen Kempf, MD 10/08/18

## 2018-10-08 NOTE — Telephone Encounter (Signed)
Pt was on dexamethasone 10mg   But it was stopped jan of this year due to [facility] treatments being over. Not sure if this would effect her B/S readings please advise [facility]

## 2018-10-08 NOTE — Progress Notes (Unsigned)
B12 injection 

## 2018-10-08 NOTE — Research (Signed)
Patient in clinic today,labs drawn per [id number] protocol. Patient was given a $50 debit card for participating in the research study. Patient was thanked for her time and contribution  to the study. Farris Has [facility] 10/08/18

## 2018-10-08 NOTE — Patient Instructions (Signed)
Coronavirus (COVID-19) Are you at risk?  Are you at risk for the Coronavirus (COVID-19)?  To be considered HIGH RISK for Coronavirus (COVID-19), you have to meet the following criteria:  . Traveled to [place], [place], [place], [place] or [place]; or in the [place] to [place], [place], [place], or [place]; and have fever, cough, and shortness of breath within the last 2 weeks of travel OR . Been in close contact with a person diagnosed with COVID-19 within the last 2 weeks and have fever, cough, and shortness of breath . IF YOU DO NOT MEET THESE CRITERIA, YOU ARE CONSIDERED LOW RISK FOR COVID-19.  What to do if you are HIGH RISK for COVID-19?  . If you are having a medical emergency, call 911. . Seek medical care right away. Before you go to a doctor's office, urgent care or emergency department, call ahead and tell them about your recent travel, contact with someone diagnosed with COVID-19, and your symptoms. You should receive instructions from your physician's office regarding next steps of care.  . When you arrive at healthcare provider, tell the healthcare staff immediately you have returned from visiting [place], [place], [place], [place] or [place]; or traveled in the [place] to [place], [place], [place], or [place]; in the last two weeks or you have been in close contact with a person diagnosed with COVID-19 in the last 2 weeks.   . Tell the health care staff about your symptoms: fever, cough and shortness of breath. . After you have been seen by a medical provider, you will be either: o Tested for (COVID-19) and discharged home on quarantine except to seek medical care if symptoms worsen, and asked to  - Stay home and avoid contact with others until you get your results (4-5 days)  - Avoid travel on public transportation if possible (such as bus, train, or airplane) or o Sent to the Emergency Department by EMS for evaluation, COVID-19 testing, and possible  admission depending on your condition and test results.  What to do if you are LOW RISK for COVID-19?  Reduce your risk of any infection by using the same precautions used for avoiding the common cold or flu:  . Wash your hands often with soap and warm water for at least 20 seconds.  If soap and water are not readily available, use an alcohol-based hand sanitizer with at least 60% alcohol.  . If coughing or sneezing, cover your mouth and nose by coughing or sneezing into the elbow areas of your shirt or coat, into a tissue or into your sleeve (not your hands). . Avoid shaking hands with others and consider head nods or verbal greetings only. . Avoid touching your eyes, nose, or mouth with unwashed hands.  . Avoid close contact with people who are sick. . Avoid places or events with large numbers of people in one location, like concerts or sporting events. . Carefully consider travel plans you have or are making. . If you are planning any travel outside or inside the [place], visit the CDC's Travelers' Health webpage for the latest health notices. . If you have some symptoms but not all symptoms, continue to monitor at home and seek medical attention if your symptoms worsen. . If you are having a medical emergency, call 911.   ADDITIONAL HEALTHCARE OPTIONS FOR PATIENTS  [facility] / e-Visit: [email or web address]         [facility]: [phone]  [facility]: [facility]: [facility] Discharge Instructions for Patients Receiving Chemotherapy  Today you received the following chemotherapy agents: Imfinzi.  To help prevent nausea and vomiting after your treatment, we encourage you to take your nausea medication as directed.   If you develop nausea and vomiting that is not controlled by your nausea medication, call the clinic.   BELOW ARE  SYMPTOMS THAT SHOULD BE REPORTED IMMEDIATELY:  *FEVER GREATER THAN 100.5 F  *CHILLS WITH OR WITHOUT FEVER  NAUSEA AND VOMITING THAT IS NOT CONTROLLED WITH YOUR NAUSEA MEDICATION  *UNUSUAL SHORTNESS OF BREATH  *UNUSUAL BRUISING OR BLEEDING  TENDERNESS IN MOUTH AND THROAT WITH OR WITHOUT PRESENCE OF ULCERS  *URINARY PROBLEMS  *BOWEL PROBLEMS  UNUSUAL RASH Items with * indicate a potential emergency and should be followed up as soon as possible.  Feel free to call the clinic should you have any questions or concerns. The clinic phone number is [phone].  Please show the [facility] at check-in to the Emergency Department and triage nurse.

## 2018-10-08 NOTE — Telephone Encounter (Signed)
Pt b/p med was cut back and diabetes med janumet is once a day due to it changing to XR . Pt will keep a log and call back next week or sooner if things change. [place]

## 2018-10-08 NOTE — Telephone Encounter (Signed)
Call and check with her.  Have her check her blood sugars more regularly before meal or 2  hours after.  Have her check with this at the end of the week with these numbers and let me know.  I will be around

## 2018-10-08 NOTE — Telephone Encounter (Signed)
[facility] called and pt was there today and BS was 314 & BP 157/63 Cassie Heilingoeter P.A. t# [phone] wanted to let you know.

## 2018-10-13 ENCOUNTER — Ambulatory Visit: Payer: Medicare Other | Admitting: Radiation Oncology

## 2018-10-13 ENCOUNTER — Telehealth: Payer: Self-pay | Admitting: *Deleted

## 2018-10-13 NOTE — Telephone Encounter (Signed)
CALLED PATIENT TO ALTER FU WITH DR. KINARD DUE TO COVID 19, PATIENT'S APPT. MOVED TO 01-08-19 @ 4 PM, PATIENT AGREED TO NEW TIME AND DATE

## 2018-10-21 ENCOUNTER — Encounter: Payer: Self-pay | Admitting: Internal Medicine

## 2018-10-21 ENCOUNTER — Inpatient Hospital Stay: Payer: Medicare Other | Attending: Internal Medicine

## 2018-10-21 ENCOUNTER — Inpatient Hospital Stay: Payer: Medicare Other

## 2018-10-21 ENCOUNTER — Other Ambulatory Visit: Payer: Self-pay

## 2018-10-21 ENCOUNTER — Inpatient Hospital Stay (HOSPITAL_BASED_OUTPATIENT_CLINIC_OR_DEPARTMENT_OTHER): Payer: Medicare Other | Admitting: Internal Medicine

## 2018-10-21 VITALS — BP 143/66 | HR 90 | Temp 98.3°F | Resp 18 | Ht 63.0 in | Wt 135.2 lb

## 2018-10-21 DIAGNOSIS — Z8673 Personal history of transient ischemic attack (TIA), and cerebral infarction without residual deficits: Secondary | ICD-10-CM | POA: Diagnosis not present

## 2018-10-21 DIAGNOSIS — I10 Essential (primary) hypertension: Secondary | ICD-10-CM | POA: Diagnosis not present

## 2018-10-21 DIAGNOSIS — C3411 Malignant neoplasm of upper lobe, right bronchus or lung: Secondary | ICD-10-CM

## 2018-10-21 DIAGNOSIS — E1140 Type 2 diabetes mellitus with diabetic neuropathy, unspecified: Secondary | ICD-10-CM | POA: Insufficient documentation

## 2018-10-21 DIAGNOSIS — R400 Somnolence: Secondary | ICD-10-CM | POA: Insufficient documentation

## 2018-10-21 DIAGNOSIS — Z87891 Personal history of nicotine dependence: Secondary | ICD-10-CM | POA: Diagnosis not present

## 2018-10-21 DIAGNOSIS — C3491 Malignant neoplasm of unspecified part of right bronchus or lung: Secondary | ICD-10-CM

## 2018-10-21 DIAGNOSIS — K219 Gastro-esophageal reflux disease without esophagitis: Secondary | ICD-10-CM

## 2018-10-21 DIAGNOSIS — Z5112 Encounter for antineoplastic immunotherapy: Secondary | ICD-10-CM | POA: Insufficient documentation

## 2018-10-21 DIAGNOSIS — E785 Hyperlipidemia, unspecified: Secondary | ICD-10-CM

## 2018-10-21 DIAGNOSIS — Z79899 Other long term (current) drug therapy: Secondary | ICD-10-CM

## 2018-10-21 DIAGNOSIS — I959 Hypotension, unspecified: Secondary | ICD-10-CM | POA: Diagnosis not present

## 2018-10-21 DIAGNOSIS — Z923 Personal history of irradiation: Secondary | ICD-10-CM | POA: Insufficient documentation

## 2018-10-21 DIAGNOSIS — Z7982 Long term (current) use of aspirin: Secondary | ICD-10-CM | POA: Diagnosis not present

## 2018-10-21 DIAGNOSIS — Z9221 Personal history of antineoplastic chemotherapy: Secondary | ICD-10-CM | POA: Diagnosis not present

## 2018-10-21 DIAGNOSIS — Z95828 Presence of other vascular implants and grafts: Secondary | ICD-10-CM | POA: Insufficient documentation

## 2018-10-21 LAB — CBC WITH DIFFERENTIAL (CANCER CENTER ONLY)
Abs Immature Granulocytes: 0.04 10*3/uL (ref 0.00–0.07)
Basophils Absolute: 0 10*3/uL (ref 0.0–0.1)
Basophils Relative: 0 %
Eosinophils Absolute: 0.3 10*3/uL (ref 0.0–0.5)
Eosinophils Relative: 2 %
HCT: 36.6 % (ref 36.0–46.0)
Hemoglobin: 11.7 g/dL — ABNORMAL LOW (ref 12.0–15.0)
Immature Granulocytes: 0 %
Lymphocytes Relative: 14 %
Lymphs Abs: 1.8 10*3/uL (ref 0.7–4.0)
MCH: 29 pg (ref 26.0–34.0)
MCHC: 32 g/dL (ref 30.0–36.0)
MCV: 90.6 fL (ref 80.0–100.0)
Monocytes Absolute: 0.9 10*3/uL (ref 0.1–1.0)
Monocytes Relative: 7 %
Neutro Abs: 9.5 10*3/uL — ABNORMAL HIGH (ref 1.7–7.7)
Neutrophils Relative %: 77 %
Platelet Count: 354 10*3/uL (ref 150–400)
RBC: 4.04 MIL/uL (ref 3.87–5.11)
RDW: 17.2 % — ABNORMAL HIGH (ref 11.5–15.5)
WBC Count: 12.6 10*3/uL — ABNORMAL HIGH (ref 4.0–10.5)
nRBC: 0 % (ref 0.0–0.2)

## 2018-10-21 LAB — CMP (CANCER CENTER ONLY)
ALT: 19 U/L (ref 0–44)
AST: 19 U/L (ref 15–41)
Albumin: 3.4 g/dL — ABNORMAL LOW (ref 3.5–5.0)
Alkaline Phosphatase: 75 U/L (ref 38–126)
Anion gap: 11 (ref 5–15)
BUN: 19 mg/dL (ref 8–23)
CO2: 26 mmol/L (ref 22–32)
Calcium: 9.5 mg/dL (ref 8.9–10.3)
Chloride: 103 mmol/L (ref 98–111)
Creatinine: 0.95 mg/dL (ref 0.44–1.00)
GFR, Est AFR Am: >60 mL/min (ref >60)
GFR, Estimated: 58 mL/min — ABNORMAL LOW (ref >60)
Glucose, Bld: 199 mg/dL — ABNORMAL HIGH (ref 70–99)
Potassium: 3.8 mmol/L (ref 3.5–5.1)
Sodium: 140 mmol/L (ref 135–145)
Total Bilirubin: 0.4 mg/dL (ref 0.3–1.2)
Total Protein: 6.8 g/dL (ref 6.5–8.1)

## 2018-10-21 MED ORDER — SODIUM CHLORIDE 0.9% FLUSH
10.0000 mL | INTRAVENOUS | Status: DC | PRN
  Administered 2018-10-21: 10 mL
  Filled 2018-10-21: qty 10

## 2018-10-21 MED ORDER — SODIUM CHLORIDE 0.9 % IV SOLN
Freq: Once | INTRAVENOUS | Status: AC
  Administered 2018-10-21: 10:00:00 via INTRAVENOUS
  Filled 2018-10-21: qty 250

## 2018-10-21 MED ORDER — SODIUM CHLORIDE 0.9 % IV SOLN
10.2000 mg/kg | Freq: Once | INTRAVENOUS | Status: AC
  Administered 2018-10-21: 620 mg via INTRAVENOUS
  Filled 2018-10-21: qty 12.4

## 2018-10-21 MED ORDER — HEPARIN SOD (PORK) LOCK FLUSH 100 UNIT/ML IV SOLN
500.0000 [IU] | Freq: Once | INTRAVENOUS | Status: AC | PRN
  Administered 2018-10-21: 500 [IU]
  Filled 2018-10-21: qty 5

## 2018-10-21 NOTE — Patient Instructions (Signed)
[facility] Discharge Instructions for Patients Receiving Chemotherapy  Today you received the following chemotherapy agents: Imfinzi.  To help prevent nausea and vomiting after your treatment, we encourage you to take your nausea medication as directed.   If you develop nausea and vomiting that is not controlled by your nausea medication, call the clinic.   BELOW ARE SYMPTOMS THAT SHOULD BE REPORTED IMMEDIATELY:  *FEVER GREATER THAN 100.5 F  *CHILLS WITH OR WITHOUT FEVER  NAUSEA AND VOMITING THAT IS NOT CONTROLLED WITH YOUR NAUSEA MEDICATION  *UNUSUAL SHORTNESS OF BREATH  *UNUSUAL BRUISING OR BLEEDING  TENDERNESS IN MOUTH AND THROAT WITH OR WITHOUT PRESENCE OF ULCERS  *URINARY PROBLEMS  *BOWEL PROBLEMS  UNUSUAL RASH Items with * indicate a potential emergency and should be followed up as soon as possible.  Feel free to call the clinic should you have any questions or concerns. The clinic phone number is [phone].  Please show the [facility] at check-in to the Emergency Department and triage nurse.  This information is directly available on the CDC website: [email or web address].co.za.html    Source:CDC Reference to specific commercial products, manufacturers, companies, or trademarks does not constitute its endorsement or recommendation by the [place], [place], or Centers for [employer] and Prevention.

## 2018-10-21 NOTE — Progress Notes (Signed)
[facility] [phone]   Fax:(336) [phone]  OFFICE PROGRESS NOTE  [name], MD [place] [address]  DIAGNOSIS:Recurrent non-small cell lung cancer likely squamous cell carcinoma that was initially diagnosed as a stage IIA(T2b, N0, M0) inAugust 2018status post curative stereotactic radiotherapy completed June 21, 2017. The patient presented today with concerning findings for disease recurrence and progression with enlarging and hypermetabolic right upper lobe lung mass presenting as stage IIIa (T3, N0, M0).  PDL 1 expression 0%.  PRIOR THERAPY: 1) Curative stereotactic radiotherapy completed on June 21, 2017. 2) A course of concurrent chemoradiation with chemotherapy consisting of carboplatin for an AUC of 2 and paclitaxel 45 mg/m.  First dose started on 06/23/2018.  Status post 7 cycles.  Last dose of chemotherapy was given August 04, 2018.  CURRENT THERAPY:Consolidation immunotherapy with Imfinzi 10 mg/KG every 2 weeks.  First dose September 09, 2018.  Status post 3 cycles.  INTERVAL HISTORY: [name] 78 y.o. female returns to the clinic today for follow-up visit.  The patient is feeling fine today with no concerning complaints.  She continues to tolerate her treatment with Imfinzi fairly well.  She denied having any chest pain, shortness of breath, cough or hemoptysis.  She denied having any fever or chills.  She has no nausea, vomiting, diarrhea or constipation.  She is here today for evaluation before starting cycle #4.  MEDICAL HISTORY: Past Medical History:  Diagnosis Date  . Cancer ([place])    Lung   . Carotid artery occlusion   . Clostridium difficile infection 06/2013  . Diabetes mellitus    takes Janumet daily  . Dyslipidemia    takes Crestor daily  . Gallstones   . GERD (gastroesophageal reflux disease)   . Heart murmur    hx of  . History of radiation therapy 06/11/17-06/21/17   right lung 50 Gy in 5 fractions  . Hypertension    takes Prinzide and Verapamil daily  . Impaired speech    from stroke  . Neuropathy, diabetic ([place])   . Pneumonia   . PONV (postoperative nausea and vomiting)   . Seizures ([place]) 04/19/2018   recent hospitalization  . Smoker   . Stroke ([place]) 06/25/13  . Vertigo    but doesn't take any meds    ALLERGIES:  is allergic to codeine; benadryl [diphenhydramine]; lipitor [atorvastatin]; and phenergan [promethazine hcl].  MEDICATIONS:  Current Outpatient Medications  Medication Sig Dispense Refill  . ALPRAZolam (XANAX) 0.25 MG tablet Take 1 tablet (0.25 mg total) by mouth every 8 (eight) hours as needed for anxiety (1-2 tablets). (Patient not taking: Reported on 08/12/2018) 6 tablet 0  . aspirin EC 81 MG tablet Take 81 mg by mouth daily.    . Blood Glucose Monitoring Suppl (ONE TOUCH ULTRA 2) w/Device KIT 1 Device by Does not apply route 2 (two) times daily. 1 each 0  . clopidogrel (PLAVIX) 75 MG tablet TAKE 1 TABLET BY MOUTH  DAILY (Patient taking differently: Take 75 mg by mouth daily. ) 90 tablet 3  . divalproex (DEPAKOTE ER) 500 MG 24 hr tablet TAKE 1 TABLET(500 MG) BY MOUTH DAILY 30 tablet 3  . glucose blood (ONE TOUCH ULTRA TEST) test strip Use as instructed 100 each 12  . Lancets (ONETOUCH ULTRASOFT) lancets Use as instructed 100 each 12  . lidocaine-prilocaine (EMLA) cream Apply 1 application topically as needed. 30 g 0  . lisinopril (PRINIVIL,ZESTRIL) 10 MG tablet Take 1  tablet (10 mg total) by mouth daily. 90 tablet 3  . lisinopril-hydrochlorothiazide (PRINZIDE,ZESTORETIC) 20-12.5 MG tablet TAKE 1 TABLET BY MOUTH  DAILY 90 tablet 1  . Multiple Vitamins-Minerals (MULTIVITAMIN PO) Take 1 tablet by mouth daily.    . prochlorperazine (COMPAZINE) 10 MG tablet Take 1 tablet (10 mg total) by mouth every 6 (six) hours as needed for nausea or vomiting. (Patient not taking: Reported on 09/01/2018) 30 tablet 0  . rosuvastatin (CRESTOR) 20 MG  tablet TAKE 1 TABLET BY MOUTH ONCE DAILY (Patient taking differently: Take 20 mg by mouth daily. ) 180 tablet 0  . SitaGLIPtin-MetFORMIN HCl (JANUMET XR) 50-1000 MG TB24 Take 1 tablet by mouth daily. 28 tablet 0  . vitamin B-12 (CYANOCOBALAMIN) 1000 MCG tablet Take 1,000 mcg by mouth daily.     No current facility-administered medications for this visit.    Facility-Administered Medications Ordered in Other Visits  Medication Dose Route Frequency Provider Last Rate Last Dose  . sodium chloride flush (NS) 0.9 % injection 10 mL  10 mL Intracatheter PRN Curt Bears, MD   10 mL at 10/21/18 0854    SURGICAL HISTORY:  Past Surgical History:  Procedure Laterality Date  . cataract removed Right   . CHOLECYSTECTOMY    . COLONOSCOPY    . ENDARTERECTOMY Left 07/14/2013   Procedure: ENDARTERECTOMY CAROTID-LEFT;  Surgeon: Elam Dutch, MD;  Location: [facility];  Service: Vascular;  Laterality: Left;  . EYE SURGERY Left    cataract  . IR IMAGING GUIDED PORT INSERTION  09/15/2018  . IR PERC PLEURAL DRAIN W/INDWELL CATH W/IMG GUIDE  05/23/2017  . KNEE SURGERY Left 44yr ago  . LAPAROSCOPIC PARTIAL COLECTOMY N/A 09/02/2015   Procedure: LAPAROSCOPIC PARTIAL RIGHT COLECTOMY;  Surgeon: ALeighton Ruff MD;  [facility];  Service: General;  Laterality: N/A;  . PATCH ANGIOPLASTY Left 07/14/2013   Procedure: LEFT CAROTID ARTERY PATCH ANGIOPLASTY;  Surgeon: CElam Dutch MD;  [place] [facility]  Service: Vascular;  Laterality: Left;  .Marland KitchenVIDEO BRONCHOSCOPY N/A 06/04/2018   Procedure: VIDEO BRONCHOSCOPY;  Surgeon: HMelrose Nakayama MD;  [facility];  Service: Thoracic;  Laterality: N/A;  . wisdom      REVIEW OF SYSTEMS:  A comprehensive review of systems was negative.   PHYSICAL EXAMINATION: General appearance: alert, cooperative and no distress Head: Normocephalic, without obvious abnormality, atraumatic Neck: no adenopathy, no JVD, supple, symmetrical, trachea midline and thyroid not  enlarged, symmetric, no tenderness/mass/nodules Lymph nodes: Cervical, supraclavicular, and axillary nodes normal. Resp: clear to auscultation bilaterally Back: symmetric, no curvature. ROM normal. No CVA tenderness. Cardio: regular rate and rhythm, S1, S2 normal, no murmur, click, rub or gallop GI: soft, non-tender; bowel sounds normal; no masses,  no organomegaly Extremities: extremities normal, atraumatic, no cyanosis or edema  ECOG PERFORMANCE STATUS: 1 - Symptomatic but completely ambulatory  Blood pressure (!) 143/66, pulse 90, temperature 98.3 F (36.8 C), temperature source Oral, resp. rate 18, height _0  (1.6 m), weight 135 lb 3.2 oz (61.3 kg), SpO2 100 %.  LABORATORY DATA: Lab Results  Component Value Date   WBC 10.7 (H) 10/08/2018   HGB 11.5 (L) 10/08/2018   HCT 36.1 10/08/2018   MCV 90.0 10/08/2018   PLT 336 10/08/2018      Chemistry      Component Value Date/Time   NA 140 10/08/2018 1110   NA 139 05/07/2018 1147   K 4.1 10/08/2018 1110   CL 101 10/08/2018 1110   CO2 27 10/08/2018 1110  BUN 18 10/08/2018 1110   BUN 15 05/07/2018 1147   CREATININE 0.93 10/08/2018 1110   CREATININE 0.87 03/08/2015 0001      Component Value Date/Time   CALCIUM 9.3 10/08/2018 1110   ALKPHOS 69 10/08/2018 1110   AST 12 (L) 10/08/2018 1110   ALT 11 10/08/2018 1110   BILITOT 0.5 10/08/2018 1110       RADIOGRAPHIC STUDIES: No results found.  ASSESSMENT AND PLAN: This is a very pleasant 78 years old white female with recurrent non-small cell lung cancer presented as a stage IIIa involving the right lower lobe. The patient underwent a course of concurrent chemoradiation with weekly carboplatin and paclitaxel status post 7 cycles.  She tolerated this treatment well. She had repeat CT scan of the chest performed recently.  I personally and independently reviewed the scan images and discussed the results with the patient and her daughter. Her scan showed improvement of the right  lung mass with no other concerning findings for progression. The patient was started on treatment with consolidation immunotherapy with Imfinzi status post 3 cycles.  She has been tolerating this treatment well. I recommended for the patient to proceed with cycle #4 today as scheduled. She will come back for follow-up visit in 2 weeks for evaluation before the next cycle of her treatment. The patient was advised to call immediately if she has any concerning symptoms in the interval. The patient voices understanding of current disease status and treatment options and is in agreement with the current care plan. All questions were answered. The patient knows to call the clinic with any problems, questions or concerns. We can certainly see the patient much sooner if necessary.  Disclaimer: This note was dictated with voice recognition software. Similar sounding words can inadvertently be transcribed and may not be corrected upon review.

## 2018-10-24 ENCOUNTER — Telehealth: Payer: Self-pay | Admitting: Internal Medicine

## 2018-10-24 NOTE — Telephone Encounter (Signed)
Rescheduled appt per MD request,moved appt 4/22 to 4/23.  Called daughter cell number and she is aware of the appt, also left a VM on the patient house phone.

## 2018-10-27 ENCOUNTER — Other Ambulatory Visit: Payer: Self-pay

## 2018-10-27 ENCOUNTER — Telehealth: Payer: Self-pay | Admitting: Family Medicine

## 2018-10-27 MED ORDER — SITAGLIP PHOS-METFORMIN HCL ER 50-1000 MG PO TB24: 1.0000 | ORAL_TABLET | Freq: Every day | ORAL | 0 refills | Status: DC

## 2018-10-27 NOTE — Telephone Encounter (Signed)
Pt daughter was called and advised and script was sent in for pt . [place]

## 2018-10-27 NOTE — Telephone Encounter (Signed)
Pt daughter called and states that she needs sample of the janument please adavise

## 2018-10-27 NOTE — Telephone Encounter (Signed)
She is going to have to try and start buying it

## 2018-10-27 NOTE — Telephone Encounter (Signed)
Pt has been getting samples of Janumet XR for a while and may need to change med to something more affordable. Please advise of replacement or if samples can be given. [place]

## 2018-10-31 ENCOUNTER — Telehealth: Payer: Self-pay

## 2018-10-31 NOTE — Telephone Encounter (Signed)
LVM to have pt to download Zoom and to call office to do screening. [place]

## 2018-11-03 ENCOUNTER — Telehealth: Payer: Self-pay | Admitting: Internal Medicine

## 2018-11-03 NOTE — Telephone Encounter (Signed)
Moved 4/23 appointments to earlier time. Spoke with patient/dtr.

## 2018-11-05 ENCOUNTER — Other Ambulatory Visit: Payer: Medicare Other

## 2018-11-05 ENCOUNTER — Ambulatory Visit: Payer: Medicare Other

## 2018-11-05 ENCOUNTER — Ambulatory Visit: Payer: Medicare Other | Admitting: Internal Medicine

## 2018-11-06 ENCOUNTER — Other Ambulatory Visit: Payer: Self-pay

## 2018-11-06 ENCOUNTER — Ambulatory Visit: Payer: Medicare Other | Admitting: Family Medicine

## 2018-11-06 ENCOUNTER — Inpatient Hospital Stay: Payer: Medicare Other

## 2018-11-06 ENCOUNTER — Inpatient Hospital Stay (HOSPITAL_BASED_OUTPATIENT_CLINIC_OR_DEPARTMENT_OTHER): Payer: Medicare Other | Admitting: Medical

## 2018-11-06 ENCOUNTER — Inpatient Hospital Stay (HOSPITAL_BASED_OUTPATIENT_CLINIC_OR_DEPARTMENT_OTHER): Payer: Medicare Other | Admitting: Internal Medicine

## 2018-11-06 ENCOUNTER — Encounter: Payer: Self-pay | Admitting: Internal Medicine

## 2018-11-06 VITALS — BP 182/63 | HR 73 | Temp 99.1°F | Resp 20 | Ht 63.0 in | Wt 135.8 lb

## 2018-11-06 VITALS — BP 86/44 | HR 71

## 2018-11-06 DIAGNOSIS — Z5112 Encounter for antineoplastic immunotherapy: Secondary | ICD-10-CM

## 2018-11-06 DIAGNOSIS — I959 Hypotension, unspecified: Secondary | ICD-10-CM | POA: Diagnosis not present

## 2018-11-06 DIAGNOSIS — I10 Essential (primary) hypertension: Secondary | ICD-10-CM | POA: Diagnosis not present

## 2018-11-06 DIAGNOSIS — C3411 Malignant neoplasm of upper lobe, right bronchus or lung: Secondary | ICD-10-CM | POA: Diagnosis not present

## 2018-11-06 DIAGNOSIS — C3491 Malignant neoplasm of unspecified part of right bronchus or lung: Secondary | ICD-10-CM

## 2018-11-06 DIAGNOSIS — E785 Hyperlipidemia, unspecified: Secondary | ICD-10-CM

## 2018-11-06 DIAGNOSIS — Z9221 Personal history of antineoplastic chemotherapy: Secondary | ICD-10-CM

## 2018-11-06 DIAGNOSIS — Z7982 Long term (current) use of aspirin: Secondary | ICD-10-CM | POA: Diagnosis not present

## 2018-11-06 DIAGNOSIS — K219 Gastro-esophageal reflux disease without esophagitis: Secondary | ICD-10-CM | POA: Diagnosis not present

## 2018-11-06 DIAGNOSIS — Z923 Personal history of irradiation: Secondary | ICD-10-CM | POA: Diagnosis not present

## 2018-11-06 DIAGNOSIS — Z95828 Presence of other vascular implants and grafts: Secondary | ICD-10-CM

## 2018-11-06 DIAGNOSIS — Z8673 Personal history of transient ischemic attack (TIA), and cerebral infarction without residual deficits: Secondary | ICD-10-CM

## 2018-11-06 DIAGNOSIS — R400 Somnolence: Secondary | ICD-10-CM

## 2018-11-06 DIAGNOSIS — E1140 Type 2 diabetes mellitus with diabetic neuropathy, unspecified: Secondary | ICD-10-CM

## 2018-11-06 DIAGNOSIS — Z79899 Other long term (current) drug therapy: Secondary | ICD-10-CM

## 2018-11-06 DIAGNOSIS — Z87891 Personal history of nicotine dependence: Secondary | ICD-10-CM

## 2018-11-06 DIAGNOSIS — R112 Nausea with vomiting, unspecified: Secondary | ICD-10-CM

## 2018-11-06 LAB — CBC WITH DIFFERENTIAL (CANCER CENTER ONLY)
Abs Immature Granulocytes: 0.05 10*3/uL (ref 0.00–0.07)
Basophils Absolute: 0 10*3/uL (ref 0.0–0.1)
Basophils Relative: 0 %
Eosinophils Absolute: 0.2 10*3/uL (ref 0.0–0.5)
Eosinophils Relative: 2 %
HCT: 37.4 % (ref 36.0–46.0)
Hemoglobin: 12.1 g/dL (ref 12.0–15.0)
Immature Granulocytes: 0 %
Lymphocytes Relative: 14 %
Lymphs Abs: 1.7 10*3/uL (ref 0.7–4.0)
MCH: 29.1 pg (ref 26.0–34.0)
MCHC: 32.4 g/dL (ref 30.0–36.0)
MCV: 89.9 fL (ref 80.0–100.0)
Monocytes Absolute: 0.7 10*3/uL (ref 0.1–1.0)
Monocytes Relative: 6 %
Neutro Abs: 9.5 10*3/uL — ABNORMAL HIGH (ref 1.7–7.7)
Neutrophils Relative %: 78 %
Platelet Count: 397 10*3/uL (ref 150–400)
RBC: 4.16 MIL/uL (ref 3.87–5.11)
RDW: 15.4 % (ref 11.5–15.5)
WBC Count: 12.2 10*3/uL — ABNORMAL HIGH (ref 4.0–10.5)
nRBC: 0 % (ref 0.0–0.2)

## 2018-11-06 LAB — CMP (CANCER CENTER ONLY)
ALT: 16 U/L (ref 0–44)
AST: 14 U/L — ABNORMAL LOW (ref 15–41)
Albumin: 3.5 g/dL (ref 3.5–5.0)
Alkaline Phosphatase: 78 U/L (ref 38–126)
Anion gap: 12 (ref 5–15)
BUN: 19 mg/dL (ref 8–23)
CO2: 27 mmol/L (ref 22–32)
Calcium: 9.6 mg/dL (ref 8.9–10.3)
Chloride: 102 mmol/L (ref 98–111)
Creatinine: 0.91 mg/dL (ref 0.44–1.00)
GFR, Est AFR Am: >60 mL/min (ref >60)
GFR, Estimated: >60 mL/min (ref >60)
Glucose, Bld: 205 mg/dL — ABNORMAL HIGH (ref 70–99)
Potassium: 3.9 mmol/L (ref 3.5–5.1)
Sodium: 141 mmol/L (ref 135–145)
Total Bilirubin: 0.4 mg/dL (ref 0.3–1.2)
Total Protein: 6.9 g/dL (ref 6.5–8.1)

## 2018-11-06 LAB — TSH: TSH: 1.841 u[IU]/mL (ref 0.308–3.960)

## 2018-11-06 MED ORDER — CLONIDINE HCL 0.1 MG PO TABS
ORAL_TABLET | ORAL | Status: AC
  Filled 2018-11-06: qty 1

## 2018-11-06 MED ORDER — SODIUM CHLORIDE 0.9 % IV SOLN
Freq: Once | INTRAVENOUS | Status: DC
  Filled 2018-11-06: qty 250

## 2018-11-06 MED ORDER — SODIUM CHLORIDE 0.9 % IV SOLN
Freq: Once | INTRAVENOUS | Status: AC
  Administered 2018-11-06: 13:00:00 via INTRAVENOUS
  Filled 2018-11-06: qty 250

## 2018-11-06 MED ORDER — ONDANSETRON HCL 4 MG/2ML IJ SOLN
4.0000 mg | Freq: Once | INTRAMUSCULAR | Status: AC
  Administered 2018-11-06: 15:00:00 4 mg via INTRAVENOUS

## 2018-11-06 MED ORDER — SODIUM CHLORIDE 0.9 % IV SOLN
Freq: Once | INTRAVENOUS | Status: AC
  Administered 2018-11-06: 15:00:00 via INTRAVENOUS
  Filled 2018-11-06: qty 250

## 2018-11-06 MED ORDER — ONDANSETRON HCL 4 MG/2ML IJ SOLN
INTRAMUSCULAR | Status: AC
  Filled 2018-11-06: qty 2

## 2018-11-06 MED ORDER — SODIUM CHLORIDE 0.9% FLUSH
10.0000 mL | INTRAVENOUS | Status: DC | PRN
  Administered 2018-11-06: 10 mL
  Filled 2018-11-06: qty 10

## 2018-11-06 MED ORDER — SODIUM CHLORIDE 0.9% FLUSH
10.0000 mL | INTRAVENOUS | Status: DC | PRN
  Administered 2018-11-06: 16:00:00 10 mL
  Filled 2018-11-06: qty 10

## 2018-11-06 MED ORDER — CLONIDINE HCL 0.1 MG PO TABS
0.2000 mg | ORAL_TABLET | Freq: Once | ORAL | Status: AC
  Administered 2018-11-06: 0.2 mg via ORAL

## 2018-11-06 MED ORDER — HEPARIN SOD (PORK) LOCK FLUSH 100 UNIT/ML IV SOLN
500.0000 [IU] | Freq: Once | INTRAVENOUS | Status: AC | PRN
  Administered 2018-11-06: 500 [IU]
  Filled 2018-11-06: qty 5

## 2018-11-06 MED ORDER — SODIUM CHLORIDE 0.9 % IV SOLN
10.2000 mg/kg | Freq: Once | INTRAVENOUS | Status: AC
  Administered 2018-11-06: 14:00:00 620 mg via INTRAVENOUS
  Filled 2018-11-06: qty 10

## 2018-11-06 NOTE — Progress Notes (Signed)
Pt appeared to be drowsy when hanging starting chemo. BP checked (see flowsheets). Sandi Mealy, PA notified and at bedside. Fluids started (see MAR). Will continue to monitor patient. Per Sandi Mealy, PA, ok to continue with imfinzi infusion with fluid bolus.   Upon completion of bolus, BP 83/42. Sandi Mealy, PA notified. Ordered to continue with remainder of bolus bag (535mL over 30 minutes). Will continue to follow.

## 2018-11-06 NOTE — Progress Notes (Signed)
[facility] [phone]   Fax:(336) [phone]  OFFICE PROGRESS NOTE  [name], MD [place] [address]  DIAGNOSIS:Recurrent non-small cell lung cancer likely squamous cell carcinoma that was initially diagnosed as a stage IIA(T2b, N0, M0) inAugust 2018status post curative stereotactic radiotherapy completed June 21, 2017. The patient presented today with concerning findings for disease recurrence and progression with enlarging and hypermetabolic right upper lobe lung mass presenting as stage IIIa (T3, N0, M0).  PDL 1 expression 0%.  PRIOR THERAPY: 1) Curative stereotactic radiotherapy completed on June 21, 2017. 2) A course of concurrent chemoradiation with chemotherapy consisting of carboplatin for an AUC of 2 and paclitaxel 45 mg/m.  First dose started on 06/23/2018.  Status post 7 cycles.  Last dose of chemotherapy was given August 04, 2018.  CURRENT THERAPY:Consolidation immunotherapy with Imfinzi 10 mg/KG every 2 weeks.  First dose September 09, 2018.  Status post 4 cycles.  INTERVAL HISTORY: [name] 78 y.o. female returns to the clinic today for follow-up visit.  The patient is feeling fine today with no concerning complaints except for the elevated blood pressure.  She took her medication earlier today but she still had elevated hypertension.  She denied having any current chest pain, shortness of breath, cough or hemoptysis.  She denied having any fever or chills.  She has no nausea, vomiting, diarrhea or constipation.  She has no headache or visual changes.  She continues to tolerate her treatment with Imfinzi fairly well.  The patient is here today for evaluation before starting cycle #5.  MEDICAL HISTORY: Past Medical History:  Diagnosis Date  . Cancer ([place])    Lung   . Carotid artery occlusion   . Clostridium difficile infection 06/2013  . Diabetes mellitus    takes Janumet daily  .  Dyslipidemia    takes Crestor daily  . Gallstones   . GERD (gastroesophageal reflux disease)   . Heart murmur    hx of  . History of radiation therapy 06/11/17-06/21/17   right lung 50 Gy in 5 fractions  . Hypertension    takes Prinzide and Verapamil daily  . Impaired speech    from stroke  . Neuropathy, diabetic ([place])   . Pneumonia   . PONV (postoperative nausea and vomiting)   . Seizures ([place]) 04/19/2018   recent hospitalization  . Smoker   . Stroke ([place]) 06/25/13  . Vertigo    but doesn't take any meds    ALLERGIES:  is allergic to codeine; benadryl [diphenhydramine]; lipitor [atorvastatin]; and phenergan [promethazine hcl].  MEDICATIONS:  Current Outpatient Medications  Medication Sig Dispense Refill  . ALPRAZolam (XANAX) 0.25 MG tablet Take 1 tablet (0.25 mg total) by mouth every 8 (eight) hours as needed for anxiety (1-2 tablets). (Patient not taking: Reported on 08/12/2018) 6 tablet 0  . aspirin EC 81 MG tablet Take 81 mg by mouth daily.    . Blood Glucose Monitoring Suppl (ONE TOUCH ULTRA 2) w/Device KIT 1 Device by Does not apply route 2 (two) times daily. 1 each 0  . clopidogrel (PLAVIX) 75 MG tablet TAKE 1 TABLET BY MOUTH  DAILY (Patient taking differently: Take 75 mg by mouth daily. ) 90 tablet 3  . divalproex (DEPAKOTE ER) 500 MG 24 hr tablet TAKE 1 TABLET(500 MG) BY MOUTH DAILY 30 tablet 3  . glucose blood (ONE TOUCH ULTRA TEST) test strip Use as instructed 100 each 12  . Lancets (ONETOUCH ULTRASOFT)  lancets Use as instructed 100 each 12  . lidocaine-prilocaine (EMLA) cream Apply 1 application topically as needed. 30 g 0  . lisinopril (PRINIVIL,ZESTRIL) 10 MG tablet Take 1 tablet (10 mg total) by mouth daily. 90 tablet 3  . lisinopril-hydrochlorothiazide (PRINZIDE,ZESTORETIC) 20-12.5 MG tablet TAKE 1 TABLET BY MOUTH  DAILY 90 tablet 1  . Multiple Vitamins-Minerals (MULTIVITAMIN PO) Take 1 tablet by mouth daily.    . prochlorperazine (COMPAZINE) 10 MG tablet Take 1  tablet (10 mg total) by mouth every 6 (six) hours as needed for nausea or vomiting. (Patient not taking: Reported on 09/01/2018) 30 tablet 0  . rosuvastatin (CRESTOR) 20 MG tablet TAKE 1 TABLET BY MOUTH ONCE DAILY (Patient taking differently: Take 20 mg by mouth daily. ) 180 tablet 0  . SitaGLIPtin-MetFORMIN HCl (JANUMET XR) 50-1000 MG TB24 Take 1 tablet by mouth daily. 28 tablet 0  . vitamin B-12 (CYANOCOBALAMIN) 1000 MCG tablet Take 1,000 mcg by mouth daily.     No current facility-administered medications for this visit.    Facility-Administered Medications Ordered in Other Visits  Medication Dose Route Frequency Provider Last Rate Last Dose  . sodium chloride flush (NS) 0.9 % injection 10 mL  10 mL Intracatheter PRN Curt Bears, MD   10 mL at 11/06/18 1222    SURGICAL HISTORY:  Past Surgical History:  Procedure Laterality Date  . cataract removed Right   . CHOLECYSTECTOMY    . COLONOSCOPY    . ENDARTERECTOMY Left 07/14/2013   Procedure: ENDARTERECTOMY CAROTID-LEFT;  Surgeon: Elam Dutch, MD;  Location: [facility];  Service: Vascular;  Laterality: Left;  . EYE SURGERY Left    cataract  . IR IMAGING GUIDED PORT INSERTION  09/15/2018  . IR PERC PLEURAL DRAIN W/INDWELL CATH W/IMG GUIDE  05/23/2017  . KNEE SURGERY Left 96yr ago  . LAPAROSCOPIC PARTIAL COLECTOMY N/A 09/02/2015   Procedure: LAPAROSCOPIC PARTIAL RIGHT COLECTOMY;  Surgeon: ALeighton Ruff MD;  [facility];  Service: General;  Laterality: N/A;  . PATCH ANGIOPLASTY Left 07/14/2013   Procedure: LEFT CAROTID ARTERY PATCH ANGIOPLASTY;  Surgeon: CElam Dutch MD;  [facility]  Service: Vascular;  Laterality: Left;  .Marland KitchenVIDEO BRONCHOSCOPY N/A 06/04/2018   Procedure: VIDEO BRONCHOSCOPY;  Surgeon: HMelrose Nakayama MD;  [facility];  Service: Thoracic;  Laterality: N/A;  . wisdom      REVIEW OF SYSTEMS:  A comprehensive review of systems was negative.   PHYSICAL EXAMINATION: General appearance: alert,  cooperative and no distress Head: Normocephalic, without obvious abnormality, atraumatic Neck: no adenopathy, no JVD, supple, symmetrical, trachea midline and thyroid not enlarged, symmetric, no tenderness/mass/nodules Lymph nodes: Cervical, supraclavicular, and axillary nodes normal. Resp: clear to auscultation bilaterally Back: symmetric, no curvature. ROM normal. No CVA tenderness. Cardio: regular rate and rhythm, S1, S2 normal, no murmur, click, rub or gallop GI: soft, non-tender; bowel sounds normal; no masses,  no organomegaly Extremities: extremities normal, atraumatic, no cyanosis or edema  ECOG PERFORMANCE STATUS: 1 - Symptomatic but completely ambulatory  Blood pressure (!) 182/63, pulse 73, temperature 99.1 F (37.3 C), temperature source Oral, resp. rate 20, height 5' 3" (1.6 m), weight 135 lb 12.8 oz (61.6 kg), SpO2 100 %.  LABORATORY DATA: Lab Results  Component Value Date   WBC 12.2 (H) 11/06/2018   HGB 12.1 11/06/2018   HCT 37.4 11/06/2018   MCV 89.9 11/06/2018   PLT 397 11/06/2018      Chemistry      Component Value Date/Time  NA 140 10/21/2018 0902   NA 139 05/07/2018 1147   K 3.8 10/21/2018 0902   CL 103 10/21/2018 0902   CO2 26 10/21/2018 0902   BUN 19 10/21/2018 0902   BUN 15 05/07/2018 1147   CREATININE 0.95 10/21/2018 0902   CREATININE 0.87 03/08/2015 0001      Component Value Date/Time   CALCIUM 9.5 10/21/2018 0902   ALKPHOS 75 10/21/2018 0902   AST 19 10/21/2018 0902   ALT 19 10/21/2018 0902   BILITOT 0.4 10/21/2018 0902       RADIOGRAPHIC STUDIES: No results found.  ASSESSMENT AND PLAN: This is a very pleasant 78 years old white female with recurrent non-small cell lung cancer presented as a stage IIIa involving the right lower lobe. The patient underwent a course of concurrent chemoradiation with weekly carboplatin and paclitaxel status post 7 cycles.  She tolerated this treatment well. She had repeat CT scan of the chest performed  recently.  I personally and independently reviewed the scan images and discussed the results with the patient and her daughter. Her scan showed improvement of the right lung mass with no other concerning findings for progression. The patient was started on treatment with consolidation immunotherapy with Imfinzi status post 4 cycles.   The patient continues to tolerate this treatment well with no concerning adverse effects. I recommended for her to proceed with cycle #5 today as a scheduled. For hypertension, I recommended for the patient to take her blood pressure medication as prescribed and to monitor it closely at home.  I will give her a dose of clonidine 0.2 mg orally today. She will come back for follow-up visit.  2 weeks for evaluation before the next cycle of her treatment. The patient voices understanding of current disease status and treatment options and is in agreement with the current care plan. All questions were answered. The patient knows to call the clinic with any problems, questions or concerns. We can certainly see the patient much sooner if necessary.  Disclaimer: This note was dictated with voice recognition software. Similar sounding words can inadvertently be transcribed and may not be corrected upon review.

## 2018-11-06 NOTE — Patient Instructions (Signed)
[facility] Discharge Instructions for Patients Receiving Chemotherapy  Today you received the following chemotherapy agents: Imfinzi.  To help prevent nausea and vomiting after your treatment, we encourage you to take your nausea medication as directed.   If you develop nausea and vomiting that is not controlled by your nausea medication, call the clinic.   BELOW ARE SYMPTOMS THAT SHOULD BE REPORTED IMMEDIATELY:  *FEVER GREATER THAN 100.5 F  *CHILLS WITH OR WITHOUT FEVER  NAUSEA AND VOMITING THAT IS NOT CONTROLLED WITH YOUR NAUSEA MEDICATION  *UNUSUAL SHORTNESS OF BREATH  *UNUSUAL BRUISING OR BLEEDING  TENDERNESS IN MOUTH AND THROAT WITH OR WITHOUT PRESENCE OF ULCERS  *URINARY PROBLEMS  *BOWEL PROBLEMS  UNUSUAL RASH Items with * indicate a potential emergency and should be followed up as soon as possible.  Feel free to call the clinic should you have any questions or concerns. The clinic phone number is [phone].  Please show the [facility] at check-in to the Emergency Department and triage nurse.  This information is directly available on the CDC website: RunningShows.co.za.html    Source:CDC Reference to specific commercial products, manufacturers, companies, or trademarks does not constitute its endorsement or recommendation by the [place], [place], or Centers for [employer] and [employer].

## 2018-11-06 NOTE — Patient Instructions (Signed)

## 2018-11-06 NOTE — Progress Notes (Signed)
Patient reports feeling much better than before. Patient discharged home with instructions to drink plenty of fluids throughout the evening and recheck blood pressure tonight.

## 2018-11-06 NOTE — Progress Notes (Signed)
I was called to see [name] as she was receiving Imfinzi. She had been noted to have a BP of 182/63 initially. She had been ordered Catapres 0.2 mg PO x 1. I was called as her BP had dropped to 71/40. She reported feeling drowsy. She was given a fluid bolus of NS 500 ml over 30 minutes. Her BP recovered to 83/42. She was given an additional fluid bolus of NS 500 ml. She was asymptomatic. She was told to push fluids tonight and to monitor her BP at home. She expressed understanding and agreement with this plain. She was discharged to home.  Sandi Mealy, MHS, PA-C Physician Assistant

## 2018-11-07 ENCOUNTER — Telehealth: Payer: Self-pay | Admitting: Internal Medicine

## 2018-11-07 NOTE — Telephone Encounter (Signed)
Returning patient's phone call regarding scheduling new appointments, patient now has updated appointments in June and July.

## 2018-11-11 ENCOUNTER — Other Ambulatory Visit (INDEPENDENT_AMBULATORY_CARE_PROVIDER_SITE_OTHER): Payer: Medicare Other

## 2018-11-11 ENCOUNTER — Other Ambulatory Visit: Payer: Self-pay

## 2018-11-11 DIAGNOSIS — E538 Deficiency of other specified B group vitamins: Secondary | ICD-10-CM | POA: Diagnosis not present

## 2018-11-11 MED ORDER — CYANOCOBALAMIN 1000 MCG/ML IJ SOLN
1000.0000 ug | Freq: Once | INTRAMUSCULAR | Status: AC
  Administered 2018-11-11: 14:00:00 1000 ug via INTRAMUSCULAR

## 2018-11-19 ENCOUNTER — Inpatient Hospital Stay: Payer: Medicare Other

## 2018-11-19 ENCOUNTER — Encounter: Payer: Self-pay | Admitting: Physician Assistant

## 2018-11-19 ENCOUNTER — Inpatient Hospital Stay: Payer: Medicare Other | Attending: Internal Medicine | Admitting: Physician Assistant

## 2018-11-19 ENCOUNTER — Other Ambulatory Visit: Payer: Self-pay

## 2018-11-19 VITALS — BP 145/74 | HR 84 | Temp 98.4°F | Resp 20 | Ht 63.0 in | Wt 136.8 lb

## 2018-11-19 DIAGNOSIS — E1140 Type 2 diabetes mellitus with diabetic neuropathy, unspecified: Secondary | ICD-10-CM

## 2018-11-19 DIAGNOSIS — K219 Gastro-esophageal reflux disease without esophagitis: Secondary | ICD-10-CM

## 2018-11-19 DIAGNOSIS — Z7902 Long term (current) use of antithrombotics/antiplatelets: Secondary | ICD-10-CM | POA: Insufficient documentation

## 2018-11-19 DIAGNOSIS — Z7984 Long term (current) use of oral hypoglycemic drugs: Secondary | ICD-10-CM

## 2018-11-19 DIAGNOSIS — Z87891 Personal history of nicotine dependence: Secondary | ICD-10-CM

## 2018-11-19 DIAGNOSIS — C3491 Malignant neoplasm of unspecified part of right bronchus or lung: Secondary | ICD-10-CM

## 2018-11-19 DIAGNOSIS — Z8673 Personal history of transient ischemic attack (TIA), and cerebral infarction without residual deficits: Secondary | ICD-10-CM

## 2018-11-19 DIAGNOSIS — R011 Cardiac murmur, unspecified: Secondary | ICD-10-CM | POA: Diagnosis not present

## 2018-11-19 DIAGNOSIS — I6529 Occlusion and stenosis of unspecified carotid artery: Secondary | ICD-10-CM | POA: Diagnosis not present

## 2018-11-19 DIAGNOSIS — Z9221 Personal history of antineoplastic chemotherapy: Secondary | ICD-10-CM | POA: Insufficient documentation

## 2018-11-19 DIAGNOSIS — Z7982 Long term (current) use of aspirin: Secondary | ICD-10-CM | POA: Diagnosis not present

## 2018-11-19 DIAGNOSIS — C3411 Malignant neoplasm of upper lobe, right bronchus or lung: Secondary | ICD-10-CM | POA: Diagnosis not present

## 2018-11-19 DIAGNOSIS — Z5112 Encounter for antineoplastic immunotherapy: Secondary | ICD-10-CM | POA: Diagnosis not present

## 2018-11-19 DIAGNOSIS — Z79899 Other long term (current) drug therapy: Secondary | ICD-10-CM | POA: Insufficient documentation

## 2018-11-19 DIAGNOSIS — G40909 Epilepsy, unspecified, not intractable, without status epilepticus: Secondary | ICD-10-CM | POA: Diagnosis not present

## 2018-11-19 DIAGNOSIS — E785 Hyperlipidemia, unspecified: Secondary | ICD-10-CM

## 2018-11-19 DIAGNOSIS — I10 Essential (primary) hypertension: Secondary | ICD-10-CM

## 2018-11-19 DIAGNOSIS — Z923 Personal history of irradiation: Secondary | ICD-10-CM

## 2018-11-19 DIAGNOSIS — Z95828 Presence of other vascular implants and grafts: Secondary | ICD-10-CM

## 2018-11-19 LAB — CMP (CANCER CENTER ONLY)
ALT: 13 U/L (ref 0–44)
AST: 13 U/L — ABNORMAL LOW (ref 15–41)
Albumin: 3.4 g/dL — ABNORMAL LOW (ref 3.5–5.0)
Alkaline Phosphatase: 69 U/L (ref 38–126)
Anion gap: 11 (ref 5–15)
BUN: 17 mg/dL (ref 8–23)
CO2: 27 mmol/L (ref 22–32)
Calcium: 9.7 mg/dL (ref 8.9–10.3)
Chloride: 103 mmol/L (ref 98–111)
Creatinine: 0.88 mg/dL (ref 0.44–1.00)
GFR, Est AFR Am: >60 mL/min (ref >60)
GFR, Estimated: >60 mL/min (ref >60)
Glucose, Bld: 183 mg/dL — ABNORMAL HIGH (ref 70–99)
Potassium: 4 mmol/L (ref 3.5–5.1)
Sodium: 141 mmol/L (ref 135–145)
Total Bilirubin: 0.5 mg/dL (ref 0.3–1.2)
Total Protein: 6.7 g/dL (ref 6.5–8.1)

## 2018-11-19 LAB — CBC WITH DIFFERENTIAL (CANCER CENTER ONLY)
Abs Immature Granulocytes: 0.02 10*3/uL (ref 0.00–0.07)
Basophils Absolute: 0 10*3/uL (ref 0.0–0.1)
Basophils Relative: 0 %
Eosinophils Absolute: 0.2 10*3/uL (ref 0.0–0.5)
Eosinophils Relative: 2 %
HCT: 36.9 % (ref 36.0–46.0)
Hemoglobin: 11.7 g/dL — ABNORMAL LOW (ref 12.0–15.0)
Immature Granulocytes: 0 %
Lymphocytes Relative: 18 %
Lymphs Abs: 1.7 10*3/uL (ref 0.7–4.0)
MCH: 29.3 pg (ref 26.0–34.0)
MCHC: 31.7 g/dL (ref 30.0–36.0)
MCV: 92.5 fL (ref 80.0–100.0)
Monocytes Absolute: 0.8 10*3/uL (ref 0.1–1.0)
Monocytes Relative: 8 %
Neutro Abs: 7 10*3/uL (ref 1.7–7.7)
Neutrophils Relative %: 72 %
Platelet Count: 328 10*3/uL (ref 150–400)
RBC: 3.99 MIL/uL (ref 3.87–5.11)
RDW: 14.7 % (ref 11.5–15.5)
WBC Count: 9.8 10*3/uL (ref 4.0–10.5)
nRBC: 0 % (ref 0.0–0.2)

## 2018-11-19 MED ORDER — SODIUM CHLORIDE 0.9% FLUSH
10.0000 mL | INTRAVENOUS | Status: DC | PRN
  Administered 2018-11-19: 10 mL
  Filled 2018-11-19: qty 10

## 2018-11-19 MED ORDER — SODIUM CHLORIDE 0.9 % IV SOLN
Freq: Once | INTRAVENOUS | Status: AC
  Administered 2018-11-19: 15:00:00 via INTRAVENOUS
  Filled 2018-11-19: qty 250

## 2018-11-19 MED ORDER — SODIUM CHLORIDE 0.9% FLUSH
10.0000 mL | INTRAVENOUS | Status: DC | PRN
  Administered 2018-11-19: 17:00:00 10 mL
  Filled 2018-11-19: qty 10

## 2018-11-19 MED ORDER — HEPARIN SOD (PORK) LOCK FLUSH 100 UNIT/ML IV SOLN
500.0000 [IU] | Freq: Once | INTRAVENOUS | Status: AC | PRN
  Administered 2018-11-19: 17:00:00 500 [IU]
  Filled 2018-11-19: qty 5

## 2018-11-19 MED ORDER — SODIUM CHLORIDE 0.9 % IV SOLN
620.0000 mg | Freq: Once | INTRAVENOUS | Status: AC
  Administered 2018-11-19: 620 mg via INTRAVENOUS
  Filled 2018-11-19: qty 10

## 2018-11-19 NOTE — Progress Notes (Signed)
[place] OFFICE PROGRESS NOTE  [name], [place] [address]  DIAGNOSIS: Recurrent non-small cell lung cancer likely squamous cell carcinoma that was initially diagnosed as a stage IIA(T2b, N0, M0) inAugust 2018status post curative stereotactic radiotherapy completed June 21, 2017. The patient presented today with concerning findings for disease recurrence and progression with enlarging and hypermetabolic right upper lobe lung mass presenting as stage IIIa (T3, N0, M0).  PDL 1 expression 0%.  PRIOR THERAPY: 1) Curative stereotactic radiotherapy completed on June 21, 2017. 2) A course of concurrent chemoradiation with chemotherapy consisting of carboplatin for an AUC of 2 and paclitaxel 45 mg/m.First dose started on 06/23/2018.Status post 7 cycles.  Last dose of chemotherapy was given August 04, 2018.  CURRENT THERAPY: Consolidation immunotherapy with Imfinzi 10 mg/KG every 2 weeks.  First dose September 09, 2018.  Status post 5 cycles  INTERVAL HISTORY: [name] 78 y.o. female returns to the clinic for a follow-up visit.  The patient is feeling well today without any concerning complaints. She continues to tolerate her treatment with immunotherapy with Imfinzi fairly well without any adverse effects.  She denies any fever, chills, night sweats, or weight loss.  She denies any chest pain, shortness of breath, cough, or hemoptysis.  She denies any nausea, vomiting, diarrhea, or constipation.  She denies any headache or visual changes.  She denies any rashes or skin changes.  She is here today for evaluation prior to starting cycle #6.  MEDICAL HISTORY: Past Medical History:  Diagnosis Date  . Cancer ([place])    Lung   . Carotid artery occlusion   . Clostridium difficile infection 06/2013  . Diabetes mellitus    takes Janumet daily  . Dyslipidemia    takes Crestor daily  . Gallstones   . GERD (gastroesophageal reflux  disease)   . Heart murmur    hx of  . History of radiation therapy 06/11/17-06/21/17   right lung 50 Gy in 5 fractions  . Hypertension    takes Prinzide and Verapamil daily  . Impaired speech    from stroke  . Neuropathy, diabetic ([place])   . Pneumonia   . PONV (postoperative nausea and vomiting)   . Seizures ([place]) 04/19/2018   recent hospitalization  . Smoker   . Stroke ([place]) 06/25/13  . Vertigo    but doesn't take any meds    ALLERGIES:  is allergic to codeine; benadryl [diphenhydramine]; lipitor [atorvastatin]; and phenergan [promethazine hcl].  MEDICATIONS:  Current Outpatient Medications  Medication Sig Dispense Refill  . ALPRAZolam (XANAX) 0.25 MG tablet Take 1 tablet (0.25 mg total) by mouth every 8 (eight) hours as needed for anxiety (1-2 tablets). (Patient not taking: Reported on 08/12/2018) 6 tablet 0  . aspirin EC 81 MG tablet Take 81 mg by mouth daily.    . Blood Glucose Monitoring Suppl (ONE TOUCH ULTRA 2) w/Device KIT 1 Device by Does not apply route 2 (two) times daily. 1 each 0  . clopidogrel (PLAVIX) 75 MG tablet TAKE 1 TABLET BY MOUTH  DAILY (Patient taking differently: Take 75 mg by mouth daily. ) 90 tablet 3  . divalproex (DEPAKOTE ER) 500 MG 24 hr tablet TAKE 1 TABLET(500 MG) BY MOUTH DAILY 30 tablet 3  . glucose blood (ONE TOUCH ULTRA TEST) test strip Use as instructed 100 each 12  . Lancets (ONETOUCH ULTRASOFT) lancets Use as instructed 100 each 12  . lidocaine-prilocaine (EMLA) cream Apply 1 application topically as needed.  30 g 0  . lisinopril (PRINIVIL,ZESTRIL) 10 MG tablet Take 1 tablet (10 mg total) by mouth daily. 90 tablet 3  . lisinopril-hydrochlorothiazide (PRINZIDE,ZESTORETIC) 20-12.5 MG tablet TAKE 1 TABLET BY MOUTH  DAILY 90 tablet 1  . Multiple Vitamins-Minerals (MULTIVITAMIN PO) Take 1 tablet by mouth daily.    . prochlorperazine (COMPAZINE) 10 MG tablet Take 1 tablet (10 mg total) by mouth every 6 (six) hours as needed for nausea or vomiting.  (Patient not taking: Reported on 09/01/2018) 30 tablet 0  . rosuvastatin (CRESTOR) 20 MG tablet TAKE 1 TABLET BY MOUTH ONCE DAILY (Patient taking differently: Take 20 mg by mouth daily. ) 180 tablet 0  . SitaGLIPtin-MetFORMIN HCl (JANUMET XR) 50-1000 MG TB24 Take 1 tablet by mouth daily. 28 tablet 0  . vitamin B-12 (CYANOCOBALAMIN) 1000 MCG tablet Take 1,000 mcg by mouth daily.     No current facility-administered medications for this visit.     SURGICAL HISTORY:  Past Surgical History:  Procedure Laterality Date  . cataract removed Right   . CHOLECYSTECTOMY    . COLONOSCOPY    . ENDARTERECTOMY Left 07/14/2013   Procedure: ENDARTERECTOMY CAROTID-LEFT;  Surgeon: Elam Dutch, MD;  Location: [facility];  Service: Vascular;  Laterality: Left;  . EYE SURGERY Left    cataract  . IR IMAGING GUIDED PORT INSERTION  09/15/2018  . IR PERC PLEURAL DRAIN W/INDWELL CATH W/IMG GUIDE  05/23/2017  . KNEE SURGERY Left 27yr ago  . LAPAROSCOPIC PARTIAL COLECTOMY N/A 09/02/2015   Procedure: LAPAROSCOPIC PARTIAL RIGHT COLECTOMY;  Surgeon: ALeighton Ruff MD;  Location: [facility];  Service: General;  Laterality: N/A;  . PATCH ANGIOPLASTY Left 07/14/2013   Procedure: LEFT CAROTID ARTERY PATCH ANGIOPLASTY;  Surgeon: CElam Dutch MD;  Location[facility]  Service: Vascular;  Laterality: Left;  .Marland KitchenVIDEO BRONCHOSCOPY N/A 06/04/2018   Procedure: VIDEO BRONCHOSCOPY;  Surgeon: HMelrose Nakayama MD;  [facility];  Service: Thoracic;  Laterality: N/A;  . wisdom      REVIEW OF SYSTEMS:   Review of Systems  Constitutional: Negative for appetite change, chills, fatigue, fever and unexpected weight change.  HENT:   Negative for mouth sores, nosebleeds, sore throat and trouble swallowing.   Eyes: Negative for eye problems and icterus.  Respiratory: Negative for cough, hemoptysis, shortness of breath and wheezing.   Cardiovascular: Negative for chest pain and leg swelling.  Gastrointestinal: Negative for abdominal  pain, constipation, diarrhea, nausea and vomiting.  Genitourinary: Negative for bladder incontinence, difficulty urinating, dysuria, frequency and hematuria.   Musculoskeletal: Negative for back pain, gait problem, neck pain and neck stiffness.  Skin: Negative for itching and rash.  Neurological: Negative for dizziness, extremity weakness, gait problem, headaches, light-headedness and seizures.  Hematological: Negative for adenopathy. Does not bruise/bleed easily.  Psychiatric/Behavioral: Negative for confusion, depression and sleep disturbance. The patient is not nervous/anxious.     PHYSICAL EXAMINATION:  Blood pressure (!) 145/74, pulse 84, temperature 98.4 F (36.9 C), temperature source Oral, resp. rate 20, height _0  (1.6 m), weight 136 lb 12.8 oz (62.1 kg), SpO2 100 %.  ECOG PERFORMANCE STATUS: 1 - Symptomatic but completely ambulatory  Physical Exam  Constitutional: Oriented to person, place, and time and well-developed, well-nourished, and in no distress.  HENT:  Head: Normocephalic and atraumatic.  Mouth/Throat: Oropharynx is clear and moist. No oropharyngeal exudate.  Eyes: Conjunctivae are normal. Right eye exhibits no discharge. Left eye exhibits no discharge. No scleral icterus.  Neck: Normal range of motion. Neck  supple.  Cardiovascular: Normal rate, regular rhythm, normal heart sounds and intact distal pulses.   Pulmonary/Chest: Effort normal and breath sounds normal. No respiratory distress. No wheezes. No rales.  Abdominal: Soft. Bowel sounds are normal. Exhibits no distension and no mass. There is no tenderness.  Musculoskeletal: Normal range of motion. Exhibits no edema.  Lymphadenopathy:    No cervical adenopathy.  Neurological: Alert and oriented to person, place, and time. Exhibits normal muscle tone. Gait normal. Coordination normal.  Skin: Skin is warm and dry. No rash noted. Not diaphoretic. No erythema. No pallor.  Psychiatric: Mood, memory and judgment  normal.  Vitals reviewed.  LABORATORY DATA: Lab Results  Component Value Date   WBC 9.8 11/19/2018   HGB 11.7 (L) 11/19/2018   HCT 36.9 11/19/2018   MCV 92.5 11/19/2018   PLT 328 11/19/2018      Chemistry      Component Value Date/Time   NA 141 11/06/2018 1150   NA 139 05/07/2018 1147   K 3.9 11/06/2018 1150   CL 102 11/06/2018 1150   CO2 27 11/06/2018 1150   BUN 19 11/06/2018 1150   BUN 15 05/07/2018 1147   CREATININE 0.91 11/06/2018 1150   CREATININE 0.87 03/08/2015 0001      Component Value Date/Time   CALCIUM 9.6 11/06/2018 1150   ALKPHOS 78 11/06/2018 1150   AST 14 (L) 11/06/2018 1150   ALT 16 11/06/2018 1150   BILITOT 0.4 11/06/2018 1150       RADIOGRAPHIC STUDIES:  No results found.   ASSESSMENT/PLAN:  This is a very pleasant 78 year old Caucasian female with recurrent IIIA non-small cell lung cancer, squamous cell carcinoma of the right upper lobe.  She presented initially as a stage IIa in August 2018.  The patient underwent a course of concurrent chemoradiation with weekly carboplatin and paclitaxel.  She is status post 7 cycles.  She tolerated treatment well.  She is currently undergoing consolidation immunotherapy with Imfinzi 10 mg/kg IV every 2 weeks. She is status post 5 cycles.  She continues to tolerate treatment well without any adverse effects. The patient was seen with Dr. Julien Nordmann today.  Labs were reviewed with the patient.  We recommend that she proceed with cycle #6 today as scheduled. I will arrange for a restaging CT scan to be performed prior to her next visit. We will see her back for a follow-up visit in 2 weeks for evaluation and to review her scan results before starting cycle #7. The patient was advised to call immediately if she has any concerning symptoms in the interval. The patient voices understanding of current disease status and treatment options and is in agreement with the current care plan. All questions were answered. The  patient knows to call the clinic with any problems, questions or concerns. We can certainly see the patient much sooner if necessary  Orders Placed This Encounter  Procedures  . CT Chest W Contrast    Standing Status:   Future    Standing Expiration Date:   11/19/2019    Order Specific Question:   ** REASON FOR EXAM (FREE TEXT)    Answer:   Restaging Lung Cancer    Order Specific Question:   If indicated for the ordered procedure, I authorize the administration of contrast media per Radiology protocol    Answer:   Yes    Order Specific Question:   Preferred imaging location?    Answer:   [facility]    Order Specific Question:  Radiology Contrast Protocol - do NOT remove file path    Answer:   [email or web address]     Nichole L Heilingoetter, PA-C 11/19/18  ADDENDUM: Hematology/Oncology Attending: I had a face-to-face encounter with the patient today.  I recommended her care plan.  This is a very pleasant 78 years old white female with recurrent non-small cell lung cancer, squamous cell carcinoma status post a course of concurrent chemoradiation and she is currently undergoing consolidation treatment with Imfinzi status post 5 cycles.  The patient has been tolerating this treatment well with no concerning adverse effects. I recommended for her to proceed with cycle #6 today. I will see her back for follow-up visit in 2 weeks for evaluation after repeating CT scan of the chest for restaging of her disease. The patient was advised to call immediately if she has any concerning symptoms in the interval. Disclaimer: This note was dictated with voice recognition software. Similar sounding words can inadvertently be transcribed and may be missed upon review. Eilleen Kempf, MD 11/19/18

## 2018-11-19 NOTE — Patient Instructions (Signed)
[facility] Discharge Instructions for Patients Receiving Chemotherapy  Today you received the following chemotherapy agents: Imfinzi.  To help prevent nausea and vomiting after your treatment, we encourage you to take your nausea medication as directed.   If you develop nausea and vomiting that is not controlled by your nausea medication, call the clinic.   BELOW ARE SYMPTOMS THAT SHOULD BE REPORTED IMMEDIATELY:  *FEVER GREATER THAN 100.5 F  *CHILLS WITH OR WITHOUT FEVER  NAUSEA AND VOMITING THAT IS NOT CONTROLLED WITH YOUR NAUSEA MEDICATION  *UNUSUAL SHORTNESS OF BREATH  *UNUSUAL BRUISING OR BLEEDING  TENDERNESS IN MOUTH AND THROAT WITH OR WITHOUT PRESENCE OF ULCERS  *URINARY PROBLEMS  *BOWEL PROBLEMS  UNUSUAL RASH Items with * indicate a potential emergency and should be followed up as soon as possible.  Feel free to call the clinic should you have any questions or concerns. The clinic phone number is [phone].  Please show the CHEMO ALERT CARD at check-in to the Emergency Department and triage nurse.   

## 2018-12-01 ENCOUNTER — Ambulatory Visit (HOSPITAL_COMMUNITY)
Admission: RE | Admit: 2018-12-01 | Discharge: 2018-12-01 | Disposition: A | Discharge status: Home / Self Care | Payer: Medicare Other | Source: Ambulatory Visit | Attending: Physician Assistant | Admitting: Physician Assistant

## 2018-12-01 ENCOUNTER — Other Ambulatory Visit: Payer: Self-pay

## 2018-12-01 DIAGNOSIS — C3491 Malignant neoplasm of unspecified part of right bronchus or lung: Secondary | ICD-10-CM | POA: Diagnosis not present

## 2018-12-01 DIAGNOSIS — C3490 Malignant neoplasm of unspecified part of unspecified bronchus or lung: Secondary | ICD-10-CM | POA: Diagnosis not present

## 2018-12-01 MED ORDER — SODIUM CHLORIDE (PF) 0.9 % IJ SOLN
INTRAMUSCULAR | Status: AC
  Filled 2018-12-01: qty 50

## 2018-12-01 MED ORDER — HEPARIN SOD (PORK) LOCK FLUSH 100 UNIT/ML IV SOLN
500.0000 [IU] | Freq: Once | INTRAVENOUS | Status: AC
  Administered 2018-12-01: 16:00:00 500 [IU] via INTRAVENOUS

## 2018-12-01 MED ORDER — IOHEXOL 300 MG/ML  SOLN
75.0000 mL | Freq: Once | INTRAMUSCULAR | Status: AC | PRN
  Administered 2018-12-01: 16:00:00 75 mL via INTRAVENOUS

## 2018-12-01 MED ORDER — HEPARIN SOD (PORK) LOCK FLUSH 100 UNIT/ML IV SOLN
INTRAVENOUS | Status: AC
  Filled 2018-12-01: qty 5

## 2018-12-03 ENCOUNTER — Other Ambulatory Visit: Payer: Self-pay

## 2018-12-03 ENCOUNTER — Encounter: Payer: Self-pay | Admitting: Physician Assistant

## 2018-12-03 ENCOUNTER — Inpatient Hospital Stay: Payer: Medicare Other

## 2018-12-03 ENCOUNTER — Inpatient Hospital Stay (HOSPITAL_BASED_OUTPATIENT_CLINIC_OR_DEPARTMENT_OTHER): Payer: Medicare Other | Admitting: Physician Assistant

## 2018-12-03 VITALS — BP 153/82 | HR 84 | Temp 99.5°F | Resp 20 | Ht 63.0 in | Wt 138.0 lb

## 2018-12-03 DIAGNOSIS — Z5112 Encounter for antineoplastic immunotherapy: Secondary | ICD-10-CM | POA: Diagnosis not present

## 2018-12-03 DIAGNOSIS — Z7984 Long term (current) use of oral hypoglycemic drugs: Secondary | ICD-10-CM | POA: Diagnosis not present

## 2018-12-03 DIAGNOSIS — E785 Hyperlipidemia, unspecified: Secondary | ICD-10-CM | POA: Diagnosis not present

## 2018-12-03 DIAGNOSIS — Z7982 Long term (current) use of aspirin: Secondary | ICD-10-CM

## 2018-12-03 DIAGNOSIS — I10 Essential (primary) hypertension: Secondary | ICD-10-CM

## 2018-12-03 DIAGNOSIS — Z79899 Other long term (current) drug therapy: Secondary | ICD-10-CM | POA: Diagnosis not present

## 2018-12-03 DIAGNOSIS — Z923 Personal history of irradiation: Secondary | ICD-10-CM | POA: Diagnosis not present

## 2018-12-03 DIAGNOSIS — E1140 Type 2 diabetes mellitus with diabetic neuropathy, unspecified: Secondary | ICD-10-CM

## 2018-12-03 DIAGNOSIS — C3491 Malignant neoplasm of unspecified part of right bronchus or lung: Secondary | ICD-10-CM

## 2018-12-03 DIAGNOSIS — R011 Cardiac murmur, unspecified: Secondary | ICD-10-CM

## 2018-12-03 DIAGNOSIS — G40909 Epilepsy, unspecified, not intractable, without status epilepticus: Secondary | ICD-10-CM

## 2018-12-03 DIAGNOSIS — K219 Gastro-esophageal reflux disease without esophagitis: Secondary | ICD-10-CM

## 2018-12-03 DIAGNOSIS — Z7902 Long term (current) use of antithrombotics/antiplatelets: Secondary | ICD-10-CM | POA: Diagnosis not present

## 2018-12-03 DIAGNOSIS — Z87891 Personal history of nicotine dependence: Secondary | ICD-10-CM | POA: Diagnosis not present

## 2018-12-03 DIAGNOSIS — Z8673 Personal history of transient ischemic attack (TIA), and cerebral infarction without residual deficits: Secondary | ICD-10-CM | POA: Diagnosis not present

## 2018-12-03 DIAGNOSIS — Z9221 Personal history of antineoplastic chemotherapy: Secondary | ICD-10-CM

## 2018-12-03 DIAGNOSIS — Z95828 Presence of other vascular implants and grafts: Secondary | ICD-10-CM

## 2018-12-03 DIAGNOSIS — I6529 Occlusion and stenosis of unspecified carotid artery: Secondary | ICD-10-CM | POA: Diagnosis not present

## 2018-12-03 DIAGNOSIS — C3411 Malignant neoplasm of upper lobe, right bronchus or lung: Secondary | ICD-10-CM | POA: Diagnosis not present

## 2018-12-03 LAB — CMP (CANCER CENTER ONLY)
ALT: 26 U/L (ref 0–44)
AST: 14 U/L — ABNORMAL LOW (ref 15–41)
Albumin: 3.4 g/dL — ABNORMAL LOW (ref 3.5–5.0)
Alkaline Phosphatase: 71 U/L (ref 38–126)
Anion gap: 10 (ref 5–15)
BUN: 16 mg/dL (ref 8–23)
CO2: 27 mmol/L (ref 22–32)
Calcium: 9.5 mg/dL (ref 8.9–10.3)
Chloride: 104 mmol/L (ref 98–111)
Creatinine: 0.9 mg/dL (ref 0.44–1.00)
GFR, Est AFR Am: >60 mL/min (ref >60)
GFR, Estimated: >60 mL/min (ref >60)
Glucose, Bld: 211 mg/dL — ABNORMAL HIGH (ref 70–99)
Potassium: 3.7 mmol/L (ref 3.5–5.1)
Sodium: 141 mmol/L (ref 135–145)
Total Bilirubin: 0.5 mg/dL (ref 0.3–1.2)
Total Protein: 6.5 g/dL (ref 6.5–8.1)

## 2018-12-03 LAB — CBC WITH DIFFERENTIAL (CANCER CENTER ONLY)
Abs Immature Granulocytes: 0.04 10*3/uL (ref 0.00–0.07)
Basophils Absolute: 0 10*3/uL (ref 0.0–0.1)
Basophils Relative: 0 %
Eosinophils Absolute: 0.2 10*3/uL (ref 0.0–0.5)
Eosinophils Relative: 2 %
HCT: 36.1 % (ref 36.0–46.0)
Hemoglobin: 11.6 g/dL — ABNORMAL LOW (ref 12.0–15.0)
Immature Granulocytes: 0 %
Lymphocytes Relative: 18 %
Lymphs Abs: 1.8 10*3/uL (ref 0.7–4.0)
MCH: 29.4 pg (ref 26.0–34.0)
MCHC: 32.1 g/dL (ref 30.0–36.0)
MCV: 91.4 fL (ref 80.0–100.0)
Monocytes Absolute: 0.8 10*3/uL (ref 0.1–1.0)
Monocytes Relative: 8 %
Neutro Abs: 7 10*3/uL (ref 1.7–7.7)
Neutrophils Relative %: 72 %
Platelet Count: 357 10*3/uL (ref 150–400)
RBC: 3.95 MIL/uL (ref 3.87–5.11)
RDW: 14.3 % (ref 11.5–15.5)
WBC Count: 9.8 10*3/uL (ref 4.0–10.5)
nRBC: 0 % (ref 0.0–0.2)

## 2018-12-03 LAB — TSH: TSH: 1.289 u[IU]/mL (ref 0.308–3.960)

## 2018-12-03 MED ORDER — SODIUM CHLORIDE 0.9 % IV SOLN
Freq: Once | INTRAVENOUS | Status: AC
  Administered 2018-12-03: 15:00:00 via INTRAVENOUS
  Filled 2018-12-03: qty 250

## 2018-12-03 MED ORDER — HEPARIN SOD (PORK) LOCK FLUSH 100 UNIT/ML IV SOLN
500.0000 [IU] | Freq: Once | INTRAVENOUS | Status: AC | PRN
  Administered 2018-12-03: 17:00:00 500 [IU]
  Filled 2018-12-03: qty 5

## 2018-12-03 MED ORDER — SODIUM CHLORIDE 0.9 % IV SOLN
620.0000 mg | Freq: Once | INTRAVENOUS | Status: AC
  Administered 2018-12-03: 620 mg via INTRAVENOUS
  Filled 2018-12-03: qty 10

## 2018-12-03 MED ORDER — SODIUM CHLORIDE 0.9% FLUSH
10.0000 mL | INTRAVENOUS | Status: DC | PRN
  Administered 2018-12-03: 10 mL
  Filled 2018-12-03: qty 10

## 2018-12-03 NOTE — Progress Notes (Signed)
Had some trouble getting blood return. After a couple of flushed and position changes was able to get blood return and get labs

## 2018-12-03 NOTE — Patient Instructions (Signed)
[facility] Discharge Instructions for Patients Receiving Chemotherapy  Today you received the following chemotherapy agents Durvalumab (IMFINZI).  To help prevent nausea and vomiting after your treatment, we encourage you to take your nausea medication as prescribed.   If you develop nausea and vomiting that is not controlled by your nausea medication, call the clinic.   BELOW ARE SYMPTOMS THAT SHOULD BE REPORTED IMMEDIATELY:  *FEVER GREATER THAN 100.5 F  *CHILLS WITH OR WITHOUT FEVER  NAUSEA AND VOMITING THAT IS NOT CONTROLLED WITH YOUR NAUSEA MEDICATION  *UNUSUAL SHORTNESS OF BREATH  *UNUSUAL BRUISING OR BLEEDING  TENDERNESS IN MOUTH AND THROAT WITH OR WITHOUT PRESENCE OF ULCERS  *URINARY PROBLEMS  *BOWEL PROBLEMS  UNUSUAL RASH Items with * indicate a potential emergency and should be followed up as soon as possible.  Feel free to call the clinic should you have any questions or concerns. The clinic phone number is [phone].  Please show the [facility] at check-in to the Emergency Department and triage nurse.  Coronavirus (COVID-19) Are you at risk?  Are you at risk for the Coronavirus (COVID-19)?  To be considered HIGH RISK for Coronavirus (COVID-19), you have to meet the following criteria:  . Traveled to [place], [place], [place], [place] or [place]; or in the [place] to [place], [place], [place], or [place]; and have fever, cough, and shortness of breath within the last 2 weeks of travel OR . Been in close contact with a person diagnosed with COVID-19 within the last 2 weeks and have fever, cough, and shortness of breath . IF YOU DO NOT MEET THESE CRITERIA, YOU ARE CONSIDERED LOW RISK FOR COVID-19.  What to do if you are HIGH RISK for COVID-19?  Marland Kitchen If you are having a medical emergency, call 911. . Seek medical care right away. Before you go to a doctor's office, urgent care or emergency department, call ahead and tell them  about your recent travel, contact with someone diagnosed with COVID-19, and your symptoms. You should receive instructions from your physician's office regarding next steps of care.  . When you arrive at healthcare provider, tell the healthcare staff immediately you have returned from visiting [place], [place], [place], [place] or [place]; or traveled in the [place] to [place], [place], [place], or [place]; in the last two weeks or you have been in close contact with a person diagnosed with COVID-19 in the last 2 weeks.   . Tell the health care staff about your symptoms: fever, cough and shortness of breath. . After you have been seen by a medical provider, you will be either: o Tested for (COVID-19) and discharged home on quarantine except to seek medical care if symptoms worsen, and asked to  - Stay home and avoid contact with others until you get your results (4-5 days)  - Avoid travel on public transportation if possible (such as bus, train, or airplane) or o Sent to the Emergency Department by EMS for evaluation, COVID-19 testing, and possible admission depending on your condition and test results.  What to do if you are LOW RISK for COVID-19?  Reduce your risk of any infection by using the same precautions used for avoiding the common cold or flu:  Marland Kitchen Wash your hands often with soap and warm water for at least 20 seconds.  If soap and water are not readily available, use an alcohol-based hand sanitizer with at least 60% alcohol.  . If coughing or  sneezing, cover your mouth and nose by coughing or sneezing into the elbow areas of your shirt or coat, into a tissue or into your sleeve (not your hands). . Avoid shaking hands with others and consider head nods or verbal greetings only. . Avoid touching your eyes, nose, or mouth with unwashed hands.  . Avoid close contact with people who are sick. . Avoid places or events with large numbers of people in one location, like concerts or  sporting events. . Carefully consider travel plans you have or are making. . If you are planning any travel outside or inside the [place], visit the CDC's Travelers' Health webpage for the latest health notices. . If you have some symptoms but not all symptoms, continue to monitor at home and seek medical attention if your symptoms worsen. . If you are having a medical emergency, call 911.   [place] / e-Visit: [email or web address]         [facility]: [facility]: [phone]                   [facility]: [phone]

## 2018-12-03 NOTE — Progress Notes (Signed)
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 The patient presented today with concerning findings for disease recurrence and progression with enlarging and hypermetabolic right upper lobe Walker mass presenting as stage IIIa (T3, N0, M0).  PDL 1 expression 0%.  PRIOR THERAPY: 1) Curative stereotactic radiotherapy completed on June 21, 2017. 2) A course of concurrent chemoradiation with chemotherapy consisting of carboplatin for an AUC of 2 and paclitaxel 45 mg/m.First dose started on 06/23/2018.Status post 7 cycles. Last dose of chemotherapy was given August 04, 2018.  CURRENT THERAPY: Consolidation immunotherapy with Imfinzi 10 mg/KG every 2 weeks. First dose September 09, 2018. Status post 6cycles  INTERVAL HISTORY: [name] 78 y.o. female returns to the clinic for a follow-up visit.  The patient is feeling well today without any concerning complaints.  She continues to tolerate her treatment with immunotherapy with Imfinzi well without any adverse effects.  She denies any fever, chills, night sweats, or weight loss.  She denies any chest pain, shortness of breath, cough, or hemoptysis.  She denies any nausea, vomiting, diarrhea, or constipation.  She denies any headache or visual changes.  She denies any rashes or skin changes.  She recently had a restaging CT scan performed.  She is here today for evaluation and to discuss her scan results before starting cycle #7 today.  MEDICAL HISTORY: Past Medical History:  Diagnosis Date  . Cancer ([place])    Walker   . Carotid artery occlusion   . Clostridium difficile infection 06/2013  . Diabetes mellitus    takes Janumet daily  .  Dyslipidemia    takes Crestor daily  . Gallstones   . GERD (gastroesophageal reflux disease)   . Heart murmur    hx of  . History of radiation therapy 06/11/17-06/21/17   right Walker 50 Gy in 5 fractions  . Hypertension    takes Prinzide and Verapamil daily  . Impaired speech    from stroke  . Neuropathy, diabetic ([place])   . Pneumonia   . PONV (postoperative nausea and vomiting)   . Seizures ([place]) 04/19/2018   recent hospitalization  . Smoker   . Stroke ([place]) 06/25/13  . Vertigo    but doesn't take any meds    ALLERGIES:  is allergic to codeine; benadryl [diphenhydramine]; lipitor [atorvastatin]; and phenergan [promethazine hcl].  MEDICATIONS:  Current Outpatient Medications  Medication Sig Dispense Refill  . ALPRAZolam (XANAX) 0.25 MG tablet Take 1 tablet (0.25 mg total) by mouth every 8 (eight) hours as needed for anxiety (1-2 tablets). (Patient not taking: Reported on 08/12/2018) 6 tablet 0  . aspirin EC 81 MG tablet Take 81 mg by mouth daily.    . Blood Glucose Monitoring Suppl (ONE TOUCH ULTRA 2) w/Device KIT 1 Device by Does not apply route 2 (two) times daily. 1 each 0  . clopidogrel (PLAVIX) 75 MG tablet TAKE 1 TABLET BY MOUTH  DAILY (Patient taking differently: Take 75 mg by mouth daily. ) 90 tablet 3  . divalproex (DEPAKOTE ER) 500 MG 24 hr tablet TAKE 1 TABLET(500 MG) BY MOUTH DAILY 30 tablet 3  . glucose blood (ONE TOUCH ULTRA TEST) test strip Use as instructed 100 each 12  . Lancets (ONETOUCH ULTRASOFT) lancets Use as instructed 100 each 12  .  lidocaine-prilocaine (EMLA) cream Apply 1 application topically as needed. 30 g 0  . lisinopril (PRINIVIL,ZESTRIL) 10 MG tablet Take 1 tablet (10 mg total) by mouth daily. 90 tablet 3  . lisinopril-hydrochlorothiazide (PRINZIDE,ZESTORETIC) 20-12.5 MG tablet TAKE 1 TABLET BY MOUTH  DAILY 90 tablet 1  . Multiple Vitamins-Minerals (MULTIVITAMIN PO) Take 1 tablet by mouth daily.    . prochlorperazine (COMPAZINE) 10 MG tablet Take 1  tablet (10 mg total) by mouth every 6 (six) hours as needed for nausea or vomiting. (Patient not taking: Reported on 09/01/2018) 30 tablet 0  . rosuvastatin (CRESTOR) 20 MG tablet TAKE 1 TABLET BY MOUTH ONCE DAILY (Patient taking differently: Take 20 mg by mouth daily. ) 180 tablet 0  . SitaGLIPtin-MetFORMIN HCl (JANUMET XR) 50-1000 MG TB24 Take 1 tablet by mouth daily. 28 tablet 0  . vitamin B-12 (CYANOCOBALAMIN) 1000 MCG tablet Take 1,000 mcg by mouth daily.     No current facility-administered medications for this visit.    Facility-Administered Medications Ordered in Other Visits  Medication Dose Route Frequency Provider Last Rate Last Dose  . durvalumab (IMFINZI) 620 mg in sodium chloride 0.9 % 100 mL chemo infusion  620 mg Intravenous Once Curt Bears, MD      . heparin lock flush 100 unit/mL  500 Units Intracatheter Once PRN Curt Bears, MD      . sodium chloride flush (NS) 0.9 % injection 10 mL  10 mL Intracatheter PRN Curt Bears, MD        SURGICAL HISTORY:  Past Surgical History:  Procedure Laterality Date  . cataract removed Right   . CHOLECYSTECTOMY    . COLONOSCOPY    . ENDARTERECTOMY Left 07/14/2013   Procedure: ENDARTERECTOMY CAROTID-LEFT;  Surgeon: Elam Dutch, MD;  Location: [facility];  Service: Vascular;  Laterality: Left;  . EYE SURGERY Left    cataract  . IR IMAGING GUIDED PORT INSERTION  09/15/2018  . IR PERC PLEURAL DRAIN W/INDWELL CATH W/IMG GUIDE  05/23/2017  . KNEE SURGERY Left 59yr ago  . LAPAROSCOPIC PARTIAL COLECTOMY N/A 09/02/2015   Procedure: LAPAROSCOPIC PARTIAL RIGHT COLECTOMY;  Surgeon: ALeighton Ruff MD;  Location: [facility];  Service: General;  Laterality: N/A;  . PATCH ANGIOPLASTY Left 07/14/2013   Procedure: LEFT CAROTID ARTERY PATCH ANGIOPLASTY;  Surgeon: CElam Dutch MD;  [facility]  Service: Vascular;  Laterality: Left;  .Marland KitchenVIDEO BRONCHOSCOPY N/A 06/04/2018   Procedure: VIDEO BRONCHOSCOPY;  Surgeon: HMelrose Nakayama MD;   [facility];  Service: Thoracic;  Laterality: N/A;  . wisdom      REVIEW OF SYSTEMS:   Review of Systems  Constitutional: Negative for appetite change, chills, fatigue, fever and unexpected weight change.  HENT:   Negative for mouth sores, nosebleeds, sore throat and trouble swallowing.   Eyes: Negative for eye problems and icterus.  Respiratory: Negative for cough, hemoptysis, shortness of breath and wheezing.   Cardiovascular: Negative for chest pain and leg swelling.  Gastrointestinal: Negative for abdominal pain, constipation, diarrhea, nausea and vomiting.  Genitourinary: Negative for bladder incontinence, difficulty urinating, dysuria, frequency and hematuria.   Musculoskeletal: Negative for back pain, gait problem, neck pain and neck stiffness.  Skin: Negative for itching and rash.  Neurological: Negative for dizziness, extremity weakness, gait problem, headaches, light-headedness and seizures.  Hematological: Negative for adenopathy. Does not bruise/bleed easily.  Psychiatric/Behavioral: Negative for confusion, depression and sleep disturbance. The patient is not nervous/anxious.     PHYSICAL EXAMINATION:  Blood pressure (!) 153/82, pulse  84, temperature 99.5 F (37.5 C), temperature source Oral, resp. rate 20, height '5\' 3"'  (1.6 m), weight 138 lb (62.6 kg), SpO2 98 %.  ECOG PERFORMANCE STATUS: 1 - Symptomatic but completely ambulatory  Physical Exam  Constitutional: Oriented to person, place, and time and well-developed, well-nourished, and in no distress.  HENT:  Head: Normocephalic and atraumatic.  Mouth/Throat: Oropharynx is clear and moist. No oropharyngeal exudate.  Eyes: Conjunctivae are normal. Right eye exhibits no discharge. Left eye exhibits no discharge. No scleral icterus.  Neck: Normal range of motion. Neck supple.  Cardiovascular: Normal rate, regular rhythm, normal heart sounds and intact distal pulses.   Pulmonary/Chest: Effort normal and breath sounds  normal. No respiratory distress. No wheezes. No rales.  Abdominal: Soft. Bowel sounds are normal. Exhibits no distension and no mass. There is no tenderness.  Musculoskeletal: Normal range of motion. Exhibits no edema.  Lymphadenopathy:    No cervical adenopathy.  Neurological: Alert and oriented to person, place, and time. Exhibits normal muscle tone. Gait normal. Coordination normal.  Skin: Skin is warm and dry. No rash noted. Not diaphoretic. No erythema. No pallor.  Psychiatric: Mood, memory and judgment normal.  Vitals reviewed.  LABORATORY DATA: Lab Results  Component Value Date   WBC 9.8 12/03/2018   HGB 11.6 (L) 12/03/2018   HCT 36.1 12/03/2018   MCV 91.4 12/03/2018   PLT 357 12/03/2018      Chemistry      Component Value Date/Time   NA 141 12/03/2018 1419   NA 139 05/07/2018 1147   K 3.7 12/03/2018 1419   CL 104 12/03/2018 1419   CO2 27 12/03/2018 1419   BUN 16 12/03/2018 1419   BUN 15 05/07/2018 1147   CREATININE 0.90 12/03/2018 1419   CREATININE 0.87 03/08/2015 0001      Component Value Date/Time   CALCIUM 9.5 12/03/2018 1419   ALKPHOS 71 12/03/2018 1419   AST 14 (L) 12/03/2018 1419   ALT 26 12/03/2018 1419   BILITOT 0.5 12/03/2018 1419       RADIOGRAPHIC STUDIES:  Ct Chest W Contrast  Result Date: 12/02/2018 CLINICAL DATA:  Recurrent non-small cell Walker cancer. EXAM: CT CHEST WITH CONTRAST TECHNIQUE: Multidetector CT imaging of the chest was performed during intravenous contrast administration. CONTRAST:  4m OMNIPAQUE IOHEXOL 300 MG/ML  SOLN COMPARISON:  08/29/2018 FINDINGS: Cardiovascular: Heart size appears normal. Aortic atherosclerosis. Three vessel coronary artery atherosclerotic calcifications. No pericardial effusion. Mediastinum/Nodes: Normal appearance of the thyroid gland. The trachea appears patent and is midline. Normal appearance of the esophagus. No supraclavicular or axillary adenopathy. No mediastinal or hilar adenopathy. Lungs/Pleura:  Moderate volume right pleural effusion identified. Mild to moderate changes of centrilobular emphysema identified. Right upper lobe Walker mass measures 4.6 by 3.8 by 2.8 cm (volume = 26 cm^3), image 44/2. On the previous exam this measured 5.2 x 4.0 by 3.1 cm (volume = 34 cm^3). No new pulmonary nodules or mass identified. Upper Abdomen: No acute abnormality. Arising from the upper pole of left kidney is a solid-appearing enhancing lesion measuring 1.2 cm, image 88/6. Musculoskeletal: No chest wall abnormality. No acute or significant osseous findings. IMPRESSION: 1. There is been mild decrease in size of right upper lobe necrotic Walker mass. 2. No new or progressive disease identified. 3. Stable loculated right pleural effusion 4. There is a small, solid-appearing enhancing lesion arising from upper pole of the left kidney. Renal cell carcinoma cannot be excluded. Consider further evaluation with contrast enhanced MRI of the kidneys. Electronically  Signed   By: Kerby Moors M.D.   On: 12/02/2018 09:56     ASSESSMENT/PLAN:  This is a very pleasant 78 year old Caucasian female with recurrent stage IIIa non-small cell Walker cancer, squamous cell carcinoma of the right upper lobe.  She presented initially as a stage IIa in August 2018.  The patient underwent a course of concurrent chemoradiation with weekly carboplatin and paclitaxel.  She is status post 7 cycles.  She tolerated treatment well.  She is currently undergoing consolidation immunotherapy with Imfinzi 10 mg/kg IV every 2 weeks.  She is status post 6 cycles.  She continues to tolerate her treatment well without any adverse effects.  The patient recently had a restaging CT scan performed.  Dr. Julien Nordmann personally and independently reviewed the scan and discussed the results with the patient today.  The scan did not show any evidence of disease progression.  Dr. Julien Nordmann recommends that she proceed on her current treatment with cycle #7 today as  scheduled.   I will see her back for a follow-up visit in 2 weeks for evaluation prior to starting cycle #8.  Regarding her high blood pressure, she was advised to continue to take her blood pressure medicine as prescribed.  I encouraged the patient to see her PCP for optimal management of her blood pressure. The patient was advised to call immediately if she has any concerning symptoms in the interval. The patient voices understanding of current disease status and treatment options and is in agreement with the current care plan. All questions were answered. The patient knows to call the clinic with any problems, questions or concerns. We can certainly see the patient much sooner if necessary   No orders of the defined types were placed in this encounter.    Larenzo Caples L Mia Winthrop, PA-C 12/03/18  ADDENDUM: Hematology/Oncology Attending: I had a face-to-face encounter with the patient today.  I recommended her care plan.  This is a very pleasant 78 years old white female with recurrent non-small cell Walker cancer, stage IIIa status post a course of concurrent chemoradiation with weekly carboplatin and paclitaxel.  The patient is currently undergoing consolidation treatment with immunotherapy with Imfinzi every 2 weeks status 6 cycles.  She has been tolerating this treatment well with no concerning complaints. The patient had repeat CT scan of the chest performed recently.  I personally and independently reviewed the scan and discussed the results with the patient today. Her scan showed no concerning findings for disease progression.  She has a suspicious lesion in the kidney that need to be monitored closely on upcoming imaging studies. For hypertension she was advised to take her blood pressure medication and to monitor it closely at home. She will continue with her current treatment with Imfinzi and she will proceed with cycle #7 today. We will see her back for follow-up visit in 2 weeks  for evaluation before the next cycle of her treatment. She was advised to call immediately if she has any concerning symptoms in the interval.  Disclaimer: This note was dictated with voice recognition software. Similar sounding words can inadvertently be transcribed and may be missed upon review. Eilleen Kempf, MD 12/03/18

## 2018-12-05 ENCOUNTER — Telehealth: Payer: Self-pay | Admitting: Medical Oncology

## 2018-12-05 NOTE — Telephone Encounter (Signed)
CT scan renal finding- further eval.-Dtr asking what is the plan for f/u of lesion left kidney. Message to [facility].

## 2018-12-06 NOTE — Telephone Encounter (Signed)
Will monitor on the next scan

## 2018-12-09 NOTE — Telephone Encounter (Signed)
LVM on Lisa's phone.

## 2018-12-10 ENCOUNTER — Other Ambulatory Visit (INDEPENDENT_AMBULATORY_CARE_PROVIDER_SITE_OTHER): Payer: Medicare Other

## 2018-12-10 ENCOUNTER — Other Ambulatory Visit: Payer: Self-pay

## 2018-12-10 DIAGNOSIS — E538 Deficiency of other specified B group vitamins: Secondary | ICD-10-CM

## 2018-12-10 DIAGNOSIS — E119 Type 2 diabetes mellitus without complications: Secondary | ICD-10-CM | POA: Diagnosis not present

## 2018-12-10 LAB — POCT GLYCOSYLATED HEMOGLOBIN (HGB A1C): Hemoglobin A1C: 9.6 % — AB (ref 4.0–5.6)

## 2018-12-10 MED ORDER — CYANOCOBALAMIN 1000 MCG/ML IJ SOLN
1000.0000 ug | Freq: Once | INTRAMUSCULAR | Status: AC
  Administered 2018-12-10: 1000 ug via INTRAMUSCULAR

## 2018-12-10 NOTE — Addendum Note (Signed)
Addended by: Carolee Rota F on: 12/10/2018 04:13 PM   Modules accepted: Orders

## 2018-12-12 ENCOUNTER — Other Ambulatory Visit: Payer: Self-pay

## 2018-12-12 ENCOUNTER — Encounter: Payer: Self-pay | Admitting: Family Medicine

## 2018-12-12 ENCOUNTER — Ambulatory Visit (INDEPENDENT_AMBULATORY_CARE_PROVIDER_SITE_OTHER): Payer: Medicare Other | Admitting: Family Medicine

## 2018-12-12 DIAGNOSIS — E1159 Type 2 diabetes mellitus with other circulatory complications: Secondary | ICD-10-CM

## 2018-12-12 DIAGNOSIS — E538 Deficiency of other specified B group vitamins: Secondary | ICD-10-CM | POA: Diagnosis not present

## 2018-12-12 DIAGNOSIS — E785 Hyperlipidemia, unspecified: Secondary | ICD-10-CM

## 2018-12-12 DIAGNOSIS — I10 Essential (primary) hypertension: Secondary | ICD-10-CM

## 2018-12-12 DIAGNOSIS — E1169 Type 2 diabetes mellitus with other specified complication: Secondary | ICD-10-CM

## 2018-12-12 DIAGNOSIS — F17210 Nicotine dependence, cigarettes, uncomplicated: Secondary | ICD-10-CM

## 2018-12-12 DIAGNOSIS — C3490 Malignant neoplasm of unspecified part of unspecified bronchus or lung: Secondary | ICD-10-CM

## 2018-12-12 DIAGNOSIS — I152 Hypertension secondary to endocrine disorders: Secondary | ICD-10-CM

## 2018-12-12 MED ORDER — SITAGLIP PHOS-METFORMIN HCL ER 50-1000 MG PO TB24: 1.0000 | ORAL_TABLET | Freq: Every day | ORAL | 5 refills | Status: DC

## 2018-12-12 NOTE — Progress Notes (Signed)
Subjective:    Patient ID: [name], female    DOB: 09/04/1940, 78 y.o.   MRN: [id number]  [name] is a 78 y.o. female who presents for follow-up of Type 2 diabetes mellitus. Documentation for virtual telephone encounter.  Interactive audio and video telecommunications were attempted between this provider and patient, however she did not have access to video capability.  We continued and completed visit with audio only. The patient was located at home. The provider was located in the office. The patient did consent to this visit and is aware of possible charges through their insurance for this visit. The other persons participating in this telemedicine service were none. Time spent on call was 10 minutes and in review of previous records >25 minutes total. This virtual service is not related to other E/M service within previous 7 days.   Patient is checking home blood sugars.   Home blood sugar records: meter records How often is blood sugars being checked: BID 268 this am fasting lowest was 210 Current symptoms/problems include elevated glucose readings . Daily foot checks: yes   Any foot concerns: none at this time. Last eye exam: 04/15/2014 Exercise: walking around the house she continues on medications listed in the chart.  There were reviewed with her in detail.  She continues to smoke but has been able to cut back.  She also takes multivitamins and extra vitamin B12.  She continues to be followed by Dr. Earlie Server for her underlying lung cancer.  She has no other concerns or complaints. The following portions of the patient's history were reviewed and updated as appropriate: allergies, current medications, past medical history, past social history and problem list.  ROS as in subjective above.     Objective:    Physical Exam Alert and in no distress otherwise not examined.  There were no vitals taken for this visit.  Lab Review Diabetic Labs Latest Ref Rng &  Units 12/10/2018 12/03/2018 11/19/2018 11/06/2018 10/21/2018  HbA1c 4.0 - 5.6 % 9.6(A) - - - -  Microalbumin mg/L - - - - -  Micro/Creat Ratio - - - - - -  Chol 0 - 200 mg/dL - - - - -  HDL >40 mg/dL - - - - -  Calc LDL 0 - 99 mg/dL - - - - -  Triglycerides <150 mg/dL - - - - -  Creatinine 0.44 - 1.00 mg/dL - 0.90 0.88 0.91 0.95   BP/Weight 12/03/2018 11/19/2018 11/06/2018 1/60/7371 0/12/2692  Systolic BP 854 627 86 035 009  Diastolic BP 82 74 44 63 66  Wt. (Lbs) 138 136.8 - 135.8 135.2  BMI 24.45 24.23 - 24.06 23.95   Foot/eye exam completion dates Latest Ref Rng & Units 03/08/2015 04/15/2014  Eye Exam No Retinopathy - No Retinopathy  Foot Form Completion - Done -  Hemoglobin A1c is 9.0 [name]  reports that she has been smoking cigarettes. She has a 30.00 pack-year smoking history. She has never used smokeless tobacco. She reports that she does not drink alcohol or use drugs.     Assessment & Plan:    DM type 2 with diabetic dyslipidemia ([place]) - Plan: SitaGLIPtin-MetFORMIN HCl (JANUMET XR) 50-1000 MG TB24  Vitamin B12 deficiency  Malignant neoplasm of lung, unspecified laterality, unspecified part of lung ([place])  Hypertension associated with diabetes ([place])  Hyperlipidemia associated with type 2 diabetes mellitus ([place])  Light smoker   1. Rx changes: Increase Janumet to twice daily dosing 2. Education: Reviewed 'ABCs'  of diabetes management (respective goals in parentheses):  A1C (<7), blood pressure (<130/80), and cholesterol (LDL <100). 3. Compliance at present is estimated to be fair. Efforts to improve compliance (if necessary) will be directed at increased exercise as tolerated. 4. Follow up: 4 months She will continue to be followed by Dr. Earlie Server for her underlying lung cancer. I again encouraged her to stop smoking but she is still not ready.

## 2018-12-13 ENCOUNTER — Other Ambulatory Visit: Payer: Self-pay | Admitting: Neurology

## 2018-12-17 ENCOUNTER — Encounter: Payer: Self-pay | Admitting: Physician Assistant

## 2018-12-17 ENCOUNTER — Inpatient Hospital Stay (HOSPITAL_BASED_OUTPATIENT_CLINIC_OR_DEPARTMENT_OTHER): Payer: Medicare Other | Admitting: Physician Assistant

## 2018-12-17 ENCOUNTER — Inpatient Hospital Stay: Payer: Medicare Other

## 2018-12-17 ENCOUNTER — Inpatient Hospital Stay: Payer: Medicare Other | Attending: Internal Medicine

## 2018-12-17 ENCOUNTER — Other Ambulatory Visit: Payer: Self-pay

## 2018-12-17 VITALS — BP 125/62 | HR 77 | Temp 98.9°F | Resp 18

## 2018-12-17 VITALS — BP 132/69 | HR 85 | Temp 98.9°F | Resp 18 | Ht 63.0 in | Wt 137.2 lb

## 2018-12-17 DIAGNOSIS — Z8673 Personal history of transient ischemic attack (TIA), and cerebral infarction without residual deficits: Secondary | ICD-10-CM | POA: Insufficient documentation

## 2018-12-17 DIAGNOSIS — F17210 Nicotine dependence, cigarettes, uncomplicated: Secondary | ICD-10-CM

## 2018-12-17 DIAGNOSIS — Z79899 Other long term (current) drug therapy: Secondary | ICD-10-CM

## 2018-12-17 DIAGNOSIS — I10 Essential (primary) hypertension: Secondary | ICD-10-CM

## 2018-12-17 DIAGNOSIS — Z5112 Encounter for antineoplastic immunotherapy: Secondary | ICD-10-CM | POA: Insufficient documentation

## 2018-12-17 DIAGNOSIS — C3411 Malignant neoplasm of upper lobe, right bronchus or lung: Secondary | ICD-10-CM

## 2018-12-17 DIAGNOSIS — C3491 Malignant neoplasm of unspecified part of right bronchus or lung: Secondary | ICD-10-CM

## 2018-12-17 DIAGNOSIS — E1140 Type 2 diabetes mellitus with diabetic neuropathy, unspecified: Secondary | ICD-10-CM

## 2018-12-17 DIAGNOSIS — Z7982 Long term (current) use of aspirin: Secondary | ICD-10-CM | POA: Diagnosis not present

## 2018-12-17 DIAGNOSIS — Z95828 Presence of other vascular implants and grafts: Secondary | ICD-10-CM

## 2018-12-17 LAB — CBC WITH DIFFERENTIAL (CANCER CENTER ONLY)
Abs Immature Granulocytes: 0.04 10*3/uL (ref 0.00–0.07)
Basophils Absolute: 0.1 10*3/uL (ref 0.0–0.1)
Basophils Relative: 1 %
Eosinophils Absolute: 0.3 10*3/uL (ref 0.0–0.5)
Eosinophils Relative: 3 %
HCT: 38 % (ref 36.0–46.0)
Hemoglobin: 12.3 g/dL (ref 12.0–15.0)
Immature Granulocytes: 0 %
Lymphocytes Relative: 20 %
Lymphs Abs: 2.1 10*3/uL (ref 0.7–4.0)
MCH: 29.4 pg (ref 26.0–34.0)
MCHC: 32.4 g/dL (ref 30.0–36.0)
MCV: 90.9 fL (ref 80.0–100.0)
Monocytes Absolute: 0.8 10*3/uL (ref 0.1–1.0)
Monocytes Relative: 8 %
Neutro Abs: 7.3 10*3/uL (ref 1.7–7.7)
Neutrophils Relative %: 68 %
Platelet Count: 335 10*3/uL (ref 150–400)
RBC: 4.18 MIL/uL (ref 3.87–5.11)
RDW: 14.4 % (ref 11.5–15.5)
WBC Count: 10.5 10*3/uL (ref 4.0–10.5)
nRBC: 0 % (ref 0.0–0.2)

## 2018-12-17 LAB — CMP (CANCER CENTER ONLY)
ALT: 26 U/L (ref 0–44)
AST: 23 U/L (ref 15–41)
Albumin: 3.5 g/dL (ref 3.5–5.0)
Alkaline Phosphatase: 72 U/L (ref 38–126)
Anion gap: 9 (ref 5–15)
BUN: 22 mg/dL (ref 8–23)
CO2: 27 mmol/L (ref 22–32)
Calcium: 9.4 mg/dL (ref 8.9–10.3)
Chloride: 104 mmol/L (ref 98–111)
Creatinine: 0.98 mg/dL (ref 0.44–1.00)
GFR, Est AFR Am: >60 mL/min (ref >60)
GFR, Estimated: 55 mL/min — ABNORMAL LOW (ref >60)
Glucose, Bld: 203 mg/dL — ABNORMAL HIGH (ref 70–99)
Potassium: 4.2 mmol/L (ref 3.5–5.1)
Sodium: 140 mmol/L (ref 135–145)
Total Bilirubin: 0.5 mg/dL (ref 0.3–1.2)
Total Protein: 6.8 g/dL (ref 6.5–8.1)

## 2018-12-17 MED ORDER — SODIUM CHLORIDE 0.9% FLUSH
10.0000 mL | INTRAVENOUS | Status: DC | PRN
  Administered 2018-12-17: 10 mL
  Filled 2018-12-17: qty 10

## 2018-12-17 MED ORDER — SODIUM CHLORIDE 0.9 % IV SOLN
Freq: Once | INTRAVENOUS | Status: AC
  Administered 2018-12-17: 16:00:00 via INTRAVENOUS
  Filled 2018-12-17: qty 250

## 2018-12-17 MED ORDER — HEPARIN SOD (PORK) LOCK FLUSH 100 UNIT/ML IV SOLN
500.0000 [IU] | Freq: Once | INTRAVENOUS | Status: AC | PRN
  Administered 2018-12-17: 500 [IU]
  Filled 2018-12-17: qty 5

## 2018-12-17 MED ORDER — SODIUM CHLORIDE 0.9% FLUSH
10.0000 mL | INTRAVENOUS | Status: DC | PRN
  Administered 2018-12-17: 14:00:00 10 mL
  Filled 2018-12-17: qty 10

## 2018-12-17 MED ORDER — SODIUM CHLORIDE 0.9 % IV SOLN
620.0000 mg | Freq: Once | INTRAVENOUS | Status: AC
  Administered 2018-12-17: 620 mg via INTRAVENOUS
  Filled 2018-12-17: qty 10

## 2018-12-17 NOTE — Progress Notes (Signed)
[facility] OFFICE PROGRESS NOTE  [name], [place] [address]  DIAGNOSIS: Recurrent non-small cell lung cancer likely squamous cell carcinoma that was initially diagnosed as a stage IIA(T2b, N0, M0) inAugust 2018status post curative stereotactic radiotherapy completed June 21, 2017. The patient presented today with concerning findings for disease recurrence and progression with enlarging and hypermetabolic right upper lobe lung mass presenting as stage IIIa (T3, N0, M0).  PDL 1 expression 0%.  PRIOR THERAPY:  1) Curative stereotactic radiotherapy completed on June 21, 2017. 2) A course of concurrent chemoradiation with chemotherapy consisting of carboplatin for an AUC of 2 and paclitaxel 45 mg/m.First dose started on 06/23/2018.Status post 7 cycles. Last dose of chemotherapy was given August 04, 2018.  CURRENT THERAPY: Consolidation immunotherapy with Imfinzi 10 mg/KG every 2 weeks. First dose September 09, 2018. Status post7cycles.   INTERVAL HISTORY: [name] 78 y.o. female returns to the clinic for a follow-up visit.  The patient recently saw her primary care provider via a virtual visit in which they modified her diabetes medication regimen.  Otherwise the patient has felt well during the interval.  She denies any fever, chills, night sweats, or weight loss.  She denies any chest pain, shortness of breath, cough, or hemoptysis.  She denies any nausea, vomiting, diarrhea, or constipation.  She denies any headache or visual changes.  She denies any rashes or skin changes.  Unfortunately, the patient continues to smoke approximately 5 cigarettes/day.  The patient states that she has used nicotine patches while in the hospital and that they took her cravings away. She is here today for evaluation before starting cycle #8.  MEDICAL HISTORY: Past Medical History:  Diagnosis Date  . Cancer ([place])    Lung   . Carotid  artery occlusion   . Clostridium difficile infection 06/2013  . Diabetes mellitus    takes Janumet daily  . Dyslipidemia    takes Crestor daily  . Gallstones   . GERD (gastroesophageal reflux disease)   . Heart murmur    hx of  . History of radiation therapy 06/11/17-06/21/17   right lung 50 Gy in 5 fractions  . Hypertension    takes Prinzide and Verapamil daily  . Impaired speech    from stroke  . Neuropathy, diabetic ([place])   . Pneumonia   . PONV (postoperative nausea and vomiting)   . Seizures ([place]) 04/19/2018   recent hospitalization  . Smoker   . Stroke ([place]) 06/25/13  . Vertigo    but doesn't take any meds    ALLERGIES:  is allergic to codeine; benadryl [diphenhydramine]; lipitor [atorvastatin]; and phenergan [promethazine hcl].  MEDICATIONS:  Current Outpatient Medications  Medication Sig Dispense Refill  . aspirin EC 81 MG tablet Take 81 mg by mouth daily.    . Blood Glucose Monitoring Suppl (ONE TOUCH ULTRA 2) w/Device KIT 1 Device by Does not apply route 2 (two) times daily. 1 each 0  . clopidogrel (PLAVIX) 75 MG tablet TAKE 1 TABLET BY MOUTH  DAILY (Patient taking differently: Take 75 mg by mouth daily. ) 90 tablet 3  . divalproex (DEPAKOTE ER) 500 MG 24 hr tablet TAKE 1 TABLET(500 MG) BY MOUTH DAILY 30 tablet 3  . glucose blood (ONE TOUCH ULTRA TEST) test strip Use as instructed 100 each 12  . Lancets (ONETOUCH ULTRASOFT) lancets Use as instructed 100 each 12  . lidocaine-prilocaine (EMLA) cream Apply 1 application topically as needed. 30 g 0  .  lisinopril (PRINIVIL,ZESTRIL) 10 MG tablet Take 1 tablet (10 mg total) by mouth daily. 90 tablet 3  . lisinopril-hydrochlorothiazide (PRINZIDE,ZESTORETIC) 20-12.5 MG tablet TAKE 1 TABLET BY MOUTH  DAILY 90 tablet 1  . Multiple Vitamins-Minerals (MULTIVITAMIN PO) Take 1 tablet by mouth daily.    . rosuvastatin (CRESTOR) 20 MG tablet TAKE 1 TABLET BY MOUTH ONCE DAILY (Patient taking differently: Take 20 mg by mouth daily. )  180 tablet 0  . SitaGLIPtin-MetFORMIN HCl (JANUMET XR) 50-1000 MG TB24 Take 1 tablet by mouth daily. 60 tablet 5  . vitamin B-12 (CYANOCOBALAMIN) 1000 MCG tablet Take 1,000 mcg by mouth daily.    Marland Kitchen ALPRAZolam (XANAX) 0.25 MG tablet Take 1 tablet (0.25 mg total) by mouth every 8 (eight) hours as needed for anxiety (1-2 tablets). (Patient not taking: Reported on 08/12/2018) 6 tablet 0  . prochlorperazine (COMPAZINE) 10 MG tablet Take 1 tablet (10 mg total) by mouth every 6 (six) hours as needed for nausea or vomiting. (Patient not taking: Reported on 09/01/2018) 30 tablet 0   No current facility-administered medications for this visit.    Facility-Administered Medications Ordered in Other Visits  Medication Dose Route Frequency Provider Last Rate Last Dose  . durvalumab (IMFINZI) 620 mg in sodium chloride 0.9 % 100 mL chemo infusion  620 mg Intravenous Once Curt Bears, MD      . heparin lock flush 100 unit/mL  500 Units Intracatheter Once PRN Curt Bears, MD      . sodium chloride flush (NS) 0.9 % injection 10 mL  10 mL Intracatheter PRN Curt Bears, MD        SURGICAL HISTORY:  Past Surgical History:  Procedure Laterality Date  . cataract removed Right   . CHOLECYSTECTOMY    . COLONOSCOPY    . ENDARTERECTOMY Left 07/14/2013   Procedure: ENDARTERECTOMY CAROTID-LEFT;  Surgeon: Elam Dutch, MD;  Location: [facility];  Service: Vascular;  Laterality: Left;  . EYE SURGERY Left    cataract  . IR IMAGING GUIDED PORT INSERTION  09/15/2018  . IR PERC PLEURAL DRAIN W/INDWELL CATH W/IMG GUIDE  05/23/2017  . KNEE SURGERY Left 31yr ago  . LAPAROSCOPIC PARTIAL COLECTOMY N/A 09/02/2015   Procedure: LAPAROSCOPIC PARTIAL RIGHT COLECTOMY;  Surgeon: ALeighton Ruff MD;  Location: [facility];  Service: General;  Laterality: N/A;  . PATCH ANGIOPLASTY Left 07/14/2013   Procedure: LEFT CAROTID ARTERY PATCH ANGIOPLASTY;  Surgeon: CElam Dutch MD;  [place] [facility]  Service: Vascular;  Laterality:  Left;  .Marland KitchenVIDEO BRONCHOSCOPY N/A 06/04/2018   Procedure: VIDEO BRONCHOSCOPY;  Surgeon: HMelrose Nakayama MD;  [facility];  Service: Thoracic;  Laterality: N/A;  . wisdom      REVIEW OF SYSTEMS:   Review of Systems  Constitutional: Negative for appetite change, chills, fatigue, fever and unexpected weight change.  HENT:   Negative for mouth sores, nosebleeds, sore throat and trouble swallowing.   Eyes: Negative for eye problems and icterus.  Respiratory: Negative for cough, hemoptysis, shortness of breath and wheezing.   Cardiovascular: Negative for chest pain and leg swelling.  Gastrointestinal: Negative for abdominal pain, constipation, diarrhea, nausea and vomiting.  Genitourinary: Negative for bladder incontinence, difficulty urinating, dysuria, frequency and hematuria.   Musculoskeletal: Negative for back pain, gait problem, neck pain and neck stiffness.  Skin: Negative for itching and rash.  Neurological: Negative for dizziness, extremity weakness, gait problem, headaches, light-headedness and seizures.  Hematological: Negative for adenopathy. Does not bruise/bleed easily.  Psychiatric/Behavioral: Negative for confusion, depression  and sleep disturbance. The patient is not nervous/anxious.     PHYSICAL EXAMINATION:  There were no vitals taken for this visit.  ECOG PERFORMANCE STATUS: 1 - Symptomatic but completely ambulatory  Physical Exam  Constitutional: Oriented to person, place, and time and well-developed, well-nourished, and in no distress.   HENT:  Head: Normocephalic and atraumatic.  Mouth/Throat: Oropharynx is clear and moist. No oropharyngeal exudate.  Eyes: Conjunctivae are normal. Right eye exhibits no discharge. Left eye exhibits no discharge. No scleral icterus.  Neck: Normal range of motion. Neck supple.  Cardiovascular: Normal rate, regular rhythm, normal heart sounds and intact distal pulses.   Pulmonary/Chest: Effort normal and breath sounds normal.  No respiratory distress. No wheezes. No rales.  Abdominal: Soft. Bowel sounds are normal. Exhibits no distension and no mass. There is no tenderness.  Musculoskeletal: Normal range of motion. Exhibits no edema.  Lymphadenopathy:    No cervical adenopathy.  Neurological: Alert and oriented to person, place, and time. Exhibits normal muscle tone. Gait normal. Coordination normal.  Skin: Skin is warm and dry. No rash noted. Not diaphoretic. No erythema. No pallor.  Psychiatric: Mood, memory and judgment normal.  Vitals reviewed.  LABORATORY DATA: Lab Results  Component Value Date   WBC 10.5 12/17/2018   HGB 12.3 12/17/2018   HCT 38.0 12/17/2018   MCV 90.9 12/17/2018   PLT 335 12/17/2018      Chemistry      Component Value Date/Time   NA 140 12/17/2018 1346   NA 139 05/07/2018 1147   K 4.2 12/17/2018 1346   CL 104 12/17/2018 1346   CO2 27 12/17/2018 1346   BUN 22 12/17/2018 1346   BUN 15 05/07/2018 1147   CREATININE 0.98 12/17/2018 1346   CREATININE 0.87 03/08/2015 0001      Component Value Date/Time   CALCIUM 9.4 12/17/2018 1346   ALKPHOS 72 12/17/2018 1346   AST 23 12/17/2018 1346   ALT 26 12/17/2018 1346   BILITOT 0.5 12/17/2018 1346       RADIOGRAPHIC STUDIES:  Ct Chest W Contrast  Result Date: 12/02/2018 CLINICAL DATA:  Recurrent non-small cell lung cancer. EXAM: CT CHEST WITH CONTRAST TECHNIQUE: Multidetector CT imaging of the chest was performed during intravenous contrast administration. CONTRAST:  2m OMNIPAQUE IOHEXOL 300 MG/ML  SOLN COMPARISON:  08/29/2018 FINDINGS: Cardiovascular: Heart size appears normal. Aortic atherosclerosis. Three vessel coronary artery atherosclerotic calcifications. No pericardial effusion. Mediastinum/Nodes: Normal appearance of the thyroid gland. The trachea appears patent and is midline. Normal appearance of the esophagus. No supraclavicular or axillary adenopathy. No mediastinal or hilar adenopathy. Lungs/Pleura: Moderate volume  right pleural effusion identified. Mild to moderate changes of centrilobular emphysema identified. Right upper lobe lung mass measures 4.6 by 3.8 by 2.8 cm (volume = 26 cm^3), image 44/2. On the previous exam this measured 5.2 x 4.0 by 3.1 cm (volume = 34 cm^3). No new pulmonary nodules or mass identified. Upper Abdomen: No acute abnormality. Arising from the upper pole of left kidney is a solid-appearing enhancing lesion measuring 1.2 cm, image 88/6. Musculoskeletal: No chest wall abnormality. No acute or significant osseous findings. IMPRESSION: 1. There is been mild decrease in size of right upper lobe necrotic lung mass. 2. No new or progressive disease identified. 3. Stable loculated right pleural effusion 4. There is a small, solid-appearing enhancing lesion arising from upper pole of the left kidney. Renal cell carcinoma cannot be excluded. Consider further evaluation with contrast enhanced MRI of the kidneys. Electronically Signed  By: Kerby Moors M.D.   On: 12/02/2018 09:56     ASSESSMENT/PLAN:  This is a very pleasant 78 year old Caucasian female with recurrent stage IIIa non-small cell lung cancer, squamous cell carcinoma of the right upper lobe.  She presented initially as a stage IIa in August 2018.  The patient underwent a course of concurrent chemoradiation with weekly carboplatin and paclitaxel.  She is status post 7 cycles.  She tolerated treatment well.  She is currently undergoing consolidation immunotherapy with Imfinzi 10 mg/kg IV every 2 weeks.  She is status post 7 cycles.  She continues to tolerate her treatment well without any adverse effects. I recommend that she proceed with cycle #8 today as scheduled.   We will see her back for a follow up visit in 2 weeks for evaluation prior to starting cycle #9.   I spent some time discussing smoking cessation with the patient. She is going consider picking up nicotine patches OTC.   The patient was advised to call immediately if she  has any concerning symptoms in the interval. The patient voices understanding of current disease status and treatment options and is in agreement with the current care plan. All questions were answered. The patient knows to call the clinic with any problems, questions or concerns. We can certainly see the patient much sooner if necessary   No orders of the defined types were placed in this encounter.    Cassandra L Heilingoetter, PA-C 12/17/18

## 2018-12-17 NOTE — Patient Instructions (Signed)
[facility] Discharge Instructions for Patients Receiving Chemotherapy  Today you received the following chemotherapy agents Durvalumab (IMFINZI).  To help prevent nausea and vomiting after your treatment, we encourage you to take your nausea medication as prescribed.   If you develop nausea and vomiting that is not controlled by your nausea medication, call the clinic.   BELOW ARE SYMPTOMS THAT SHOULD BE REPORTED IMMEDIATELY:  *FEVER GREATER THAN 100.5 F  *CHILLS WITH OR WITHOUT FEVER  NAUSEA AND VOMITING THAT IS NOT CONTROLLED WITH YOUR NAUSEA MEDICATION  *UNUSUAL SHORTNESS OF BREATH  *UNUSUAL BRUISING OR BLEEDING  TENDERNESS IN MOUTH AND THROAT WITH OR WITHOUT PRESENCE OF ULCERS  *URINARY PROBLEMS  *BOWEL PROBLEMS  UNUSUAL RASH Items with * indicate a potential emergency and should be followed up as soon as possible.  Feel free to call the clinic should you have any questions or concerns. The clinic phone number is [phone].  Please show the [facility] at check-in to the Emergency Department and triage nurse.  Coronavirus (COVID-19) Are you at risk?  Are you at risk for the Coronavirus (COVID-19)?  To be considered HIGH RISK for Coronavirus (COVID-19), you have to meet the following criteria:  . Traveled to [place], [place], [place], [place] or [place]; or in the [place] to [place], [place], [place], or [place]; and have fever, cough, and shortness of breath within the last 2 weeks of travel OR . Been in close contact with a person diagnosed with COVID-19 within the last 2 weeks and have fever, cough, and shortness of breath . IF YOU DO NOT MEET THESE CRITERIA, YOU ARE CONSIDERED LOW RISK FOR COVID-19.  What to do if you are HIGH RISK for COVID-19?  Marland Kitchen If you are having a medical emergency, call 911. . Seek medical care right away. Before you go to a doctor's office, urgent care or emergency department, call ahead and tell them  about your recent travel, contact with someone diagnosed with COVID-19, and your symptoms. You should receive instructions from your physician's office regarding next steps of care.  . When you arrive at healthcare provider, tell the healthcare staff immediately you have returned from visiting [place], [place], [place], [place] or [place]; or traveled in the [place] to [place], [place], [place], or [place]; in the last two weeks or you have been in close contact with a person diagnosed with COVID-19 in the last 2 weeks.   . Tell the health care staff about your symptoms: fever, cough and shortness of breath. . After you have been seen by a medical provider, you will be either: o Tested for (COVID-19) and discharged home on quarantine except to seek medical care if symptoms worsen, and asked to  - Stay home and avoid contact with others until you get your results (4-5 days)  - Avoid travel on public transportation if possible (such as bus, train, or airplane) or o Sent to the Emergency Department by EMS for evaluation, COVID-19 testing, and possible admission depending on your condition and test results.  What to do if you are LOW RISK for COVID-19?  Reduce your risk of any infection by using the same precautions used for avoiding the common cold or flu:  Marland Kitchen Wash your hands often with soap and warm water for at least 20 seconds.  If soap and water are not readily available, use an alcohol-based hand sanitizer with at least 60% alcohol.  . If coughing or  sneezing, cover your mouth and nose by coughing or sneezing into the elbow areas of your shirt or coat, into a tissue or into your sleeve (not your hands). . Avoid shaking hands with others and consider head nods or verbal greetings only. . Avoid touching your eyes, nose, or mouth with unwashed hands.  . Avoid close contact with people who are sick. . Avoid places or events with large numbers of people in one location, like concerts or  sporting events. . Carefully consider travel plans you have or are making. . If you are planning any travel outside or inside the [place], visit the CDC's Travelers' Health webpage for the latest health notices. . If you have some symptoms but not all symptoms, continue to monitor at home and seek medical attention if your symptoms worsen. . If you are having a medical emergency, call 911.   [facility] / e-Visit: [email or web address]         [facility]: [facility]: [phone]                   [facility]: [phone]

## 2018-12-19 ENCOUNTER — Other Ambulatory Visit: Payer: Self-pay | Admitting: Family Medicine

## 2018-12-19 DIAGNOSIS — Z9889 Other specified postprocedural states: Secondary | ICD-10-CM

## 2018-12-19 NOTE — Telephone Encounter (Signed)
Is this okay to refill? 

## 2018-12-31 ENCOUNTER — Inpatient Hospital Stay: Payer: Medicare Other

## 2018-12-31 ENCOUNTER — Other Ambulatory Visit: Payer: Self-pay

## 2018-12-31 ENCOUNTER — Encounter: Payer: Self-pay | Admitting: Physician Assistant

## 2018-12-31 ENCOUNTER — Inpatient Hospital Stay (HOSPITAL_BASED_OUTPATIENT_CLINIC_OR_DEPARTMENT_OTHER): Payer: Medicare Other | Admitting: Physician Assistant

## 2018-12-31 VITALS — BP 140/54 | HR 82 | Temp 98.7°F | Resp 20 | Ht 63.0 in | Wt 139.1 lb

## 2018-12-31 DIAGNOSIS — F17210 Nicotine dependence, cigarettes, uncomplicated: Secondary | ICD-10-CM

## 2018-12-31 DIAGNOSIS — Z79899 Other long term (current) drug therapy: Secondary | ICD-10-CM

## 2018-12-31 DIAGNOSIS — Z5112 Encounter for antineoplastic immunotherapy: Secondary | ICD-10-CM | POA: Diagnosis not present

## 2018-12-31 DIAGNOSIS — C3411 Malignant neoplasm of upper lobe, right bronchus or lung: Secondary | ICD-10-CM | POA: Diagnosis not present

## 2018-12-31 DIAGNOSIS — I10 Essential (primary) hypertension: Secondary | ICD-10-CM | POA: Diagnosis not present

## 2018-12-31 DIAGNOSIS — Z95828 Presence of other vascular implants and grafts: Secondary | ICD-10-CM

## 2018-12-31 DIAGNOSIS — Z8673 Personal history of transient ischemic attack (TIA), and cerebral infarction without residual deficits: Secondary | ICD-10-CM | POA: Diagnosis not present

## 2018-12-31 DIAGNOSIS — Z7982 Long term (current) use of aspirin: Secondary | ICD-10-CM

## 2018-12-31 DIAGNOSIS — C3491 Malignant neoplasm of unspecified part of right bronchus or lung: Secondary | ICD-10-CM

## 2018-12-31 DIAGNOSIS — E1140 Type 2 diabetes mellitus with diabetic neuropathy, unspecified: Secondary | ICD-10-CM | POA: Diagnosis not present

## 2018-12-31 LAB — CMP (CANCER CENTER ONLY)
ALT: 21 U/L (ref 0–44)
AST: 17 U/L (ref 15–41)
Albumin: 3.6 g/dL (ref 3.5–5.0)
Alkaline Phosphatase: 67 U/L (ref 38–126)
Anion gap: 11 (ref 5–15)
BUN: 16 mg/dL (ref 8–23)
CO2: 27 mmol/L (ref 22–32)
Calcium: 9.5 mg/dL (ref 8.9–10.3)
Chloride: 102 mmol/L (ref 98–111)
Creatinine: 0.98 mg/dL (ref 0.44–1.00)
GFR, Est AFR Am: >60 mL/min (ref >60)
GFR, Estimated: 55 mL/min — ABNORMAL LOW (ref >60)
Glucose, Bld: 180 mg/dL — ABNORMAL HIGH (ref 70–99)
Potassium: 3.8 mmol/L (ref 3.5–5.1)
Sodium: 140 mmol/L (ref 135–145)
Total Bilirubin: 0.4 mg/dL (ref 0.3–1.2)
Total Protein: 6.8 g/dL (ref 6.5–8.1)

## 2018-12-31 LAB — CBC WITH DIFFERENTIAL (CANCER CENTER ONLY)
Abs Immature Granulocytes: 0.04 10*3/uL (ref 0.00–0.07)
Basophils Absolute: 0.1 10*3/uL (ref 0.0–0.1)
Basophils Relative: 1 %
Eosinophils Absolute: 0.3 10*3/uL (ref 0.0–0.5)
Eosinophils Relative: 3 %
HCT: 38.2 % (ref 36.0–46.0)
Hemoglobin: 12.4 g/dL (ref 12.0–15.0)
Immature Granulocytes: 0 %
Lymphocytes Relative: 18 %
Lymphs Abs: 1.9 10*3/uL (ref 0.7–4.0)
MCH: 29.5 pg (ref 26.0–34.0)
MCHC: 32.5 g/dL (ref 30.0–36.0)
MCV: 90.7 fL (ref 80.0–100.0)
Monocytes Absolute: 0.8 10*3/uL (ref 0.1–1.0)
Monocytes Relative: 7 %
Neutro Abs: 7.2 10*3/uL (ref 1.7–7.7)
Neutrophils Relative %: 71 %
Platelet Count: 357 10*3/uL (ref 150–400)
RBC: 4.21 MIL/uL (ref 3.87–5.11)
RDW: 14.4 % (ref 11.5–15.5)
WBC Count: 10.2 10*3/uL (ref 4.0–10.5)
nRBC: 0 % (ref 0.0–0.2)

## 2018-12-31 MED ORDER — SODIUM CHLORIDE 0.9 % IV SOLN
620.0000 mg | Freq: Once | INTRAVENOUS | Status: AC
  Administered 2018-12-31: 16:00:00 620 mg via INTRAVENOUS
  Filled 2018-12-31: qty 10

## 2018-12-31 MED ORDER — SODIUM CHLORIDE 0.9 % IV SOLN
Freq: Once | INTRAVENOUS | Status: AC
  Administered 2018-12-31: 15:00:00 via INTRAVENOUS
  Filled 2018-12-31: qty 250

## 2018-12-31 MED ORDER — SODIUM CHLORIDE 0.9% FLUSH
10.0000 mL | INTRAVENOUS | Status: DC | PRN
  Administered 2018-12-31: 10 mL
  Filled 2018-12-31: qty 10

## 2018-12-31 MED ORDER — HEPARIN SOD (PORK) LOCK FLUSH 100 UNIT/ML IV SOLN
500.0000 [IU] | Freq: Once | INTRAVENOUS | Status: AC | PRN
  Administered 2018-12-31: 17:00:00 500 [IU]
  Filled 2018-12-31: qty 5

## 2018-12-31 NOTE — Progress Notes (Signed)
[facility] OFFICE PROGRESS NOTE  [name], [place] [address]  DIAGNOSIS: Recurrent non-small cell lung cancer likely squamous cell carcinoma that was initially diagnosed as a stage IIA(T2b, N0, M0) inAugust 2018status post curative stereotactic radiotherapy completed June 21, 2017. The patient presented today with concerning findings for disease recurrence and progression with enlarging and hypermetabolic right upper lobe lung mass presenting as stage IIIa (T3, N0, M0).  PDL 1 expression 0%.  PRIOR THERAPY:  1) Curative stereotactic radiotherapy completed on June 21, 2017. 2) A course of concurrent chemoradiation with chemotherapy consisting of carboplatin for an AUC of 2 and paclitaxel 45 mg/m.First dose started on 06/23/2018.Status post 7 cycles. Last dose of chemotherapy was given August 04, 2018.  CURRENT THERAPY: Consolidation immunotherapy with Imfinzi 10 mg/KG every 2 weeks. First dose September 09, 2018. Status post8cycles.   INTERVAL HISTORY: [name] 78 y.o. female returns to the clinic for a follow-up visit.  The patient is feeling today without any concerning complaints.  The patient continues to tolerate her treatment well without any adverse effects.  She denies any fever, chills, night sweats, or weight loss.  She denies any chest pain, shortness of breath, cough, or hemoptysis.  She denies any nausea, vomiting, diarrhea, or constipation.  She denies any headache or visual changes.  She denies any rashes or skin changes.  She is here today for evaluation before starting cycle #9 of her treatment.  MEDICAL HISTORY: Past Medical History:  Diagnosis Date  . Cancer ([place])    Lung   . Carotid artery occlusion   . Clostridium difficile infection 06/2013  . Diabetes mellitus    takes Janumet daily  . Dyslipidemia    takes Crestor daily  . Gallstones   . GERD (gastroesophageal reflux disease)   .  Heart murmur    hx of  . History of radiation therapy 06/11/17-06/21/17   right lung 50 Gy in 5 fractions  . Hypertension    takes Prinzide and Verapamil daily  . Impaired speech    from stroke  . Neuropathy, diabetic ([place])   . Pneumonia   . PONV (postoperative nausea and vomiting)   . Seizures ([place]) 04/19/2018   recent hospitalization  . Smoker   . Stroke ([place]) 06/25/13  . Vertigo    but doesn't take any meds    ALLERGIES:  is allergic to codeine; benadryl [diphenhydramine]; lipitor [atorvastatin]; and phenergan [promethazine hcl].  MEDICATIONS:  Current Outpatient Medications  Medication Sig Dispense Refill  . aspirin EC 81 MG tablet Take 81 mg by mouth daily.    . Blood Glucose Monitoring Suppl (ONE TOUCH ULTRA 2) w/Device KIT 1 Device by Does not apply route 2 (two) times daily. 1 each 0  . clopidogrel (PLAVIX) 75 MG tablet Take 1 tablet (75 mg total) by mouth daily. 90 tablet 3  . divalproex (DEPAKOTE ER) 500 MG 24 hr tablet TAKE 1 TABLET(500 MG) BY MOUTH DAILY 30 tablet 3  . glucose blood (ONE TOUCH ULTRA TEST) test strip Use as instructed 100 each 12  . Lancets (ONETOUCH ULTRASOFT) lancets Use as instructed 100 each 12  . lidocaine-prilocaine (EMLA) cream Apply 1 application topically as needed. 30 g 0  . lisinopril-hydrochlorothiazide (PRINZIDE,ZESTORETIC) 20-12.5 MG tablet TAKE 1 TABLET BY MOUTH  DAILY 90 tablet 1  . Multiple Vitamins-Minerals (MULTIVITAMIN PO) Take 1 tablet by mouth daily.    . rosuvastatin (CRESTOR) 20 MG tablet TAKE 1 TABLET BY MOUTH ONCE  DAILY (Patient taking differently: Take 20 mg by mouth daily. ) 180 tablet 0  . SitaGLIPtin-MetFORMIN HCl (JANUMET XR) 50-1000 MG TB24 Take 1 tablet by mouth daily. 60 tablet 5  . vitamin B-12 (CYANOCOBALAMIN) 1000 MCG tablet Take 1,000 mcg by mouth daily.    [name] ALPRAZolam (XANAX) 0.25 MG tablet Take 1 tablet (0.25 mg total) by mouth every 8 (eight) hours as needed for anxiety (1-2 tablets). (Patient not taking:  Reported on 08/12/2018) 6 tablet 0  . lisinopril (PRINIVIL,ZESTRIL) 10 MG tablet Take 1 tablet (10 mg total) by mouth daily. (Patient not taking: Reported on 12/31/2018) 90 tablet 3  . prochlorperazine (COMPAZINE) 10 MG tablet Take 1 tablet (10 mg total) by mouth every 6 (six) hours as needed for nausea or vomiting. (Patient not taking: Reported on 09/01/2018) 30 tablet 0   No current facility-administered medications for this visit.     SURGICAL HISTORY:  Past Surgical History:  Procedure Laterality Date  . cataract removed Right   . CHOLECYSTECTOMY    . COLONOSCOPY    . ENDARTERECTOMY Left 07/14/2013   Procedure: ENDARTERECTOMY CAROTID-LEFT;  Surgeon: Elam Dutch, MD;  Location: [facility];  Service: Vascular;  Laterality: Left;  . EYE SURGERY Left    cataract  . IR IMAGING GUIDED PORT INSERTION  09/15/2018  . IR PERC PLEURAL DRAIN W/INDWELL CATH W/IMG GUIDE  05/23/2017  . KNEE SURGERY Left 93yr ago  . LAPAROSCOPIC PARTIAL COLECTOMY N/A 09/02/2015   Procedure: LAPAROSCOPIC PARTIAL RIGHT COLECTOMY;  Surgeon: ALeighton Ruff MD;  Location: [facility];  Service: General;  Laterality: N/A;  . PATCH ANGIOPLASTY Left 07/14/2013   Procedure: LEFT CAROTID ARTERY PATCH ANGIOPLASTY;  Surgeon: CElam Dutch MD;  [facility]  Service: Vascular;  Laterality: Left;  .Nichole KitchenVIDEO BRONCHOSCOPY N/A 06/04/2018   Procedure: VIDEO BRONCHOSCOPY;  Surgeon: HMelrose Nakayama MD;  [facility];  Service: Thoracic;  Laterality: N/A;  . wisdom      REVIEW OF SYSTEMS:   Review of Systems  Constitutional: Negative for appetite change, chills, fatigue, fever and unexpected weight change.  HENT:   Negative for mouth sores, nosebleeds, sore throat and trouble swallowing.   Eyes: Negative for eye problems and icterus.  Respiratory: Negative for cough, hemoptysis, shortness of breath and wheezing.   Cardiovascular: Negative for chest pain and leg swelling.  Gastrointestinal: Negative for abdominal pain,  constipation, diarrhea, nausea and vomiting.  Genitourinary: Negative for bladder incontinence, difficulty urinating, dysuria, frequency and hematuria.   Musculoskeletal: Negative for back pain, gait problem, neck pain and neck stiffness.  Skin: Negative for itching and rash.  Neurological: Negative for dizziness, extremity weakness, gait problem, headaches, light-headedness and seizures.  Hematological: Negative for adenopathy. Does not bruise/bleed easily.  Psychiatric/Behavioral: Negative for confusion, depression and sleep disturbance. The patient is not nervous/anxious.     PHYSICAL EXAMINATION:  Blood pressure (!) 140/54, pulse 82, temperature 98.7 F (37.1 C), temperature source Oral, resp. rate 20, height '5\' 3"'  (1.6 m), weight 139 lb 1.6 oz (63.1 kg), SpO2 98 %.  ECOG PERFORMANCE STATUS: 1 - Symptomatic but completely ambulatory  Physical Exam  Constitutional: Oriented to person, place, and time and well-developed, well-nourished, and in no distress.  HENT:  Head: Normocephalic and atraumatic.  Mouth/Throat: Oropharynx is clear and moist. No oropharyngeal exudate.  Eyes: Conjunctivae are normal. Right eye exhibits no discharge. Left eye exhibits no discharge. No scleral icterus.  Neck: Normal range of motion. Neck supple.  Cardiovascular: Normal rate, regular rhythm,  normal heart sounds and intact distal pulses.   Pulmonary/Chest: Effort normal and breath sounds normal. No respiratory distress. No wheezes. No rales.  Abdominal: Soft. Bowel sounds are normal. Exhibits no distension and no mass. There is no tenderness.  Musculoskeletal: Normal range of motion. Exhibits no edema.  Lymphadenopathy:    No cervical adenopathy.  Neurological: Alert and oriented to person, place, and time. Exhibits normal muscle tone. Gait normal. Coordination normal.  Skin: Skin is warm and dry. No rash noted. Not diaphoretic. No erythema. No pallor.  Psychiatric: Mood, memory and judgment normal.   Vitals reviewed.  LABORATORY DATA: Lab Results  Component Value Date   WBC 10.2 12/31/2018   HGB 12.4 12/31/2018   HCT 38.2 12/31/2018   MCV 90.7 12/31/2018   PLT 357 12/31/2018      Chemistry      Component Value Date/Time   NA 140 12/17/2018 1346   NA 139 05/07/2018 1147   K 4.2 12/17/2018 1346   CL 104 12/17/2018 1346   CO2 27 12/17/2018 1346   BUN 22 12/17/2018 1346   BUN 15 05/07/2018 1147   CREATININE 0.98 12/17/2018 1346   CREATININE 0.87 03/08/2015 0001      Component Value Date/Time   CALCIUM 9.4 12/17/2018 1346   ALKPHOS 72 12/17/2018 1346   AST 23 12/17/2018 1346   ALT 26 12/17/2018 1346   BILITOT 0.5 12/17/2018 1346       RADIOGRAPHIC STUDIES:  Ct Chest W Contrast  Result Date: 12/02/2018 CLINICAL DATA:  Recurrent non-small cell lung cancer. EXAM: CT CHEST WITH CONTRAST TECHNIQUE: Multidetector CT imaging of the chest was performed during intravenous contrast administration. CONTRAST:  66m OMNIPAQUE IOHEXOL 300 MG/ML  SOLN COMPARISON:  08/29/2018 FINDINGS: Cardiovascular: Heart size appears normal. Aortic atherosclerosis. Three vessel coronary artery atherosclerotic calcifications. No pericardial effusion. Mediastinum/Nodes: Normal appearance of the thyroid gland. The trachea appears patent and is midline. Normal appearance of the esophagus. No supraclavicular or axillary adenopathy. No mediastinal or hilar adenopathy. Lungs/Pleura: Moderate volume right pleural effusion identified. Mild to moderate changes of centrilobular emphysema identified. Right upper lobe lung mass measures 4.6 by 3.8 by 2.8 cm (volume = 26 cm^3), image 44/2. On the previous exam this measured 5.2 x 4.0 by 3.1 cm (volume = 34 cm^3). No new pulmonary nodules or mass identified. Upper Abdomen: No acute abnormality. Arising from the upper pole of left kidney is a solid-appearing enhancing lesion measuring 1.2 cm, image 88/6. Musculoskeletal: No chest wall abnormality. No acute or significant  osseous findings. IMPRESSION: 1. There is been mild decrease in size of right upper lobe necrotic lung mass. 2. No new or progressive disease identified. 3. Stable loculated right pleural effusion 4. There is a small, solid-appearing enhancing lesion arising from upper pole of the left kidney. Renal cell carcinoma cannot be excluded. Consider further evaluation with contrast enhanced MRI of the kidneys. Electronically Signed   By: TKerby MoorsM.D.   On: 12/02/2018 09:56     ASSESSMENT/PLAN:  This is a very pleasant 78year old Caucasian female with recurrent stage IIIa non-small cell lung cancer, squamous cell carcinoma of the right upper lobe.  She presented initially as a stage IIa in August 2018.  The patient underwent a course of concurrent chemoradiation with weekly carboplatin and paclitaxel.  She is status post 7 cycles.  She tolerated treatment well. She is currently undergoing consolidation immunotherapy with Imfinzi 10 mg/kg IV every 2 weeks.  She is status post 8 cycles.  She continues  to tolerate her treatment well without any adverse effects.  The patient was seen with Dr. Julien Nordmann today.  Labs were reviewed with the patient.  We recommend that she proceed with cycle #9 today as scheduled.  I will see her back for follow-up visit in 2 weeks for evaluation before starting cycle #10. The patient was advised to call immediately if she has any concerning symptoms in the interval. The patient voices understanding of current disease status and treatment options and is in agreement with the current care plan. All questions were answered. The patient knows to call the clinic with any problems, questions or concerns. We can certainly see the patient much sooner if necessary  No orders of the defined types were placed in this encounter.     L , PA-C 12/31/18  ADDENDUM: Hematology/Oncology Attending: I had a face-to-face encounter with the patient today.  I  recommended her care plan.  This is a very pleasant 78 years old white female with recurrent non-small cell lung cancer, squamous cell carcinoma status post a course of concurrent chemoradiation with weekly carboplatin and paclitaxel with partial response.  The patient is currently on consolidation treatment with immunotherapy with Imfinzi status post 8 cycles.  She has been tolerating her treatment well with no concerning adverse effects. I recommended for the patient to continue her current treatment with Imfinzi and she will proceed with cycle #9 today. I will see the patient back for follow-up visit in 2 weeks for evaluation before the next cycle of her treatment. She was advised to call immediately if she has any concerning symptoms in the interval. Disclaimer: This note was dictated with voice recognition software. Similar sounding words can inadvertently be transcribed and may be missed upon review. Eilleen Kempf, MD 12/31/18

## 2018-12-31 NOTE — Patient Instructions (Signed)
[facility] Discharge Instructions for Patients Receiving Chemotherapy  Today you received the following chemotherapy agents Durvalumab (IMFINZI).  To help prevent nausea and vomiting after your treatment, we encourage you to take your nausea medication as prescribed.   If you develop nausea and vomiting that is not controlled by your nausea medication, call the clinic.   BELOW ARE SYMPTOMS THAT SHOULD BE REPORTED IMMEDIATELY:  *FEVER GREATER THAN 100.5 F  *CHILLS WITH OR WITHOUT FEVER  NAUSEA AND VOMITING THAT IS NOT CONTROLLED WITH YOUR NAUSEA MEDICATION  *UNUSUAL SHORTNESS OF BREATH  *UNUSUAL BRUISING OR BLEEDING  TENDERNESS IN MOUTH AND THROAT WITH OR WITHOUT PRESENCE OF ULCERS  *URINARY PROBLEMS  *BOWEL PROBLEMS  UNUSUAL RASH Items with * indicate a potential emergency and should be followed up as soon as possible.  Feel free to call the clinic should you have any questions or concerns. The clinic phone number is [phone].  Please show the [facility] at check-in to the Emergency Department and triage nurse.  Coronavirus (COVID-19) Are you at risk?  Are you at risk for the Coronavirus (COVID-19)?  To be considered HIGH RISK for Coronavirus (COVID-19), you have to meet the following criteria:  . Traveled to [place], [place], [place], [place] or [place]; or in the [place] to [place], [place], [place], or [place]; and have fever, cough, and shortness of breath within the last 2 weeks of travel OR . Been in close contact with a person diagnosed with COVID-19 within the last 2 weeks and have fever, cough, and shortness of breath . IF YOU DO NOT MEET THESE CRITERIA, YOU ARE CONSIDERED LOW RISK FOR COVID-19.  What to do if you are HIGH RISK for COVID-19?  Marland Kitchen If you are having a medical emergency, call 911. . Seek medical care right away. Before you go to a doctor's office, urgent care or emergency department, call ahead and tell them  about your recent travel, contact with someone diagnosed with COVID-19, and your symptoms. You should receive instructions from your physician's office regarding next steps of care.  . When you arrive at healthcare provider, tell the healthcare staff immediately you have returned from visiting [place], [place], [place], [place] or [place]; or traveled in the [place] to [place], [place], [place], or [place]; in the last two weeks or you have been in close contact with a person diagnosed with COVID-19 in the last 2 weeks.   . Tell the health care staff about your symptoms: fever, cough and shortness of breath. . After you have been seen by a medical provider, you will be either: o Tested for (COVID-19) and discharged home on quarantine except to seek medical care if symptoms worsen, and asked to  - Stay home and avoid contact with others until you get your results (4-5 days)  - Avoid travel on public transportation if possible (such as bus, train, or airplane) or o Sent to the Emergency Department by EMS for evaluation, COVID-19 testing, and possible admission depending on your condition and test results.  What to do if you are LOW RISK for COVID-19?  Reduce your risk of any infection by using the same precautions used for avoiding the common cold or flu:  Marland Kitchen Wash your hands often with soap and warm water for at least 20 seconds.  If soap and water are not readily available, use an alcohol-based hand sanitizer with at least 60% alcohol.  . If coughing or  sneezing, cover your mouth and nose by coughing or sneezing into the elbow areas of your shirt or coat, into a tissue or into your sleeve (not your hands). . Avoid shaking hands with others and consider head nods or verbal greetings only. . Avoid touching your eyes, nose, or mouth with unwashed hands.  . Avoid close contact with people who are sick. . Avoid places or events with large numbers of people in one location, like concerts or  sporting events. . Carefully consider travel plans you have or are making. . If you are planning any travel outside or inside the [place], visit the CDC's Travelers' Health webpage for the latest health notices. . If you have some symptoms but not all symptoms, continue to monitor at home and seek medical attention if your symptoms worsen. . If you are having a medical emergency, call 911.   [facility] / e-Visit: [email or web address]         [facility]: [facility]: [phone]                   [facility]: [phone]

## 2019-01-01 ENCOUNTER — Telehealth: Payer: Self-pay | Admitting: Physician Assistant

## 2019-01-01 LAB — TSH: TSH: 1.617 u[IU]/mL (ref 0.308–3.960)

## 2019-01-01 NOTE — Telephone Encounter (Signed)
Scheduled appt per 6/17 los.

## 2019-01-08 ENCOUNTER — Other Ambulatory Visit: Payer: Self-pay

## 2019-01-08 ENCOUNTER — Encounter: Payer: Self-pay | Admitting: Radiation Oncology

## 2019-01-08 ENCOUNTER — Other Ambulatory Visit (INDEPENDENT_AMBULATORY_CARE_PROVIDER_SITE_OTHER): Payer: Medicare Other

## 2019-01-08 ENCOUNTER — Ambulatory Visit
Admission: RE | Admit: 2019-01-08 | Discharge: 2019-01-08 | Disposition: A | Discharge status: Home / Self Care | Payer: Medicare Other | Source: Ambulatory Visit | Attending: Radiation Oncology | Admitting: Radiation Oncology

## 2019-01-08 VITALS — BP 150/68 | HR 88 | Temp 98.5°F | Resp 18 | Ht 63.0 in | Wt 140.1 lb

## 2019-01-08 DIAGNOSIS — Z79899 Other long term (current) drug therapy: Secondary | ICD-10-CM | POA: Diagnosis not present

## 2019-01-08 DIAGNOSIS — C3491 Malignant neoplasm of unspecified part of right bronchus or lung: Secondary | ICD-10-CM

## 2019-01-08 DIAGNOSIS — E538 Deficiency of other specified B group vitamins: Secondary | ICD-10-CM

## 2019-01-08 DIAGNOSIS — C3411 Malignant neoplasm of upper lobe, right bronchus or lung: Secondary | ICD-10-CM | POA: Insufficient documentation

## 2019-01-08 DIAGNOSIS — Z7982 Long term (current) use of aspirin: Secondary | ICD-10-CM | POA: Diagnosis not present

## 2019-01-08 DIAGNOSIS — Z923 Personal history of irradiation: Secondary | ICD-10-CM | POA: Insufficient documentation

## 2019-01-08 DIAGNOSIS — Z7984 Long term (current) use of oral hypoglycemic drugs: Secondary | ICD-10-CM | POA: Diagnosis not present

## 2019-01-08 DIAGNOSIS — Z87891 Personal history of nicotine dependence: Secondary | ICD-10-CM | POA: Insufficient documentation

## 2019-01-08 DIAGNOSIS — Z08 Encounter for follow-up examination after completed treatment for malignant neoplasm: Secondary | ICD-10-CM | POA: Diagnosis not present

## 2019-01-08 MED ORDER — CYANOCOBALAMIN 1000 MCG/ML IJ SOLN
1000.0000 ug | Freq: Once | INTRAMUSCULAR | Status: AC
  Administered 2019-01-08: 1000 ug via INTRAMUSCULAR

## 2019-01-08 NOTE — Progress Notes (Signed)
Pt presents today for f/u with Dr. Sondra Come. Pt with occasional cough when pt bends over, white sputum. Pt denies hemoptysis. Pt denies difficulty swallowing. Pt denies difficulty breathing, SOB. Pt is accompanied by daughter to facilitate information exchange.   BP (!) 150/68 (BP Location: Right Arm, Patient Position: Sitting)   Pulse 88   Temp 98.5 F (36.9 C) (Temporal)   Resp 18   Ht 5\' 3"  (1.6 m)   Wt 140 lb 2 oz (63.6 kg)   SpO2 100%   BMI 24.82 kg/m   Wt Readings from Last 3 Encounters:  01/08/19 140 lb 2 oz (63.6 kg)  12/31/18 139 lb 1.6 oz (63.1 kg)  12/17/18 137 lb 3.2 oz (62.2 kg)   Loma Sousa, RN BSN

## 2019-01-08 NOTE — Patient Instructions (Signed)
Coronavirus (COVID-19) Are you at risk?  Are you at risk for the Coronavirus (COVID-19)?  To be considered HIGH RISK for Coronavirus (COVID-19), you have to meet the following criteria:  . Traveled to [place], [place], [place], [place] or [place]; or in the [place] to [place], [place], [place], or [place]; and have fever, cough, and shortness of breath within the last 2 weeks of travel OR . Been in close contact with a person diagnosed with COVID-19 within the last 2 weeks and have fever, cough, and shortness of breath . IF YOU DO NOT MEET THESE CRITERIA, YOU ARE CONSIDERED LOW RISK FOR COVID-19.  What to do if you are HIGH RISK for COVID-19?  . If you are having a medical emergency, call 911. . Seek medical care right away. Before you go to a doctor's office, urgent care or emergency department, call ahead and tell them about your recent travel, contact with someone diagnosed with COVID-19, and your symptoms. You should receive instructions from your physician's office regarding next steps of care.  . When you arrive at healthcare provider, tell the healthcare staff immediately you have returned from visiting [place], [place], [place], [place] or [place]; or traveled in the [place] to [place], [place], [place], or [place]; in the last two weeks or you have been in close contact with a person diagnosed with COVID-19 in the last 2 weeks.   . Tell the health care staff about your symptoms: fever, cough and shortness of breath. . After you have been seen by a medical provider, you will be either: o Tested for (COVID-19) and discharged home on quarantine except to seek medical care if symptoms worsen, and asked to  - Stay home and avoid contact with others until you get your results (4-5 days)  - Avoid travel on public transportation if possible (such as bus, train, or airplane) or o Sent to the Emergency Department by EMS for evaluation, COVID-19 testing, and possible  admission depending on your condition and test results.  What to do if you are LOW RISK for COVID-19?  Reduce your risk of any infection by using the same precautions used for avoiding the common cold or flu:  . Wash your hands often with soap and warm water for at least 20 seconds.  If soap and water are not readily available, use an alcohol-based hand sanitizer with at least 60% alcohol.  . If coughing or sneezing, cover your mouth and nose by coughing or sneezing into the elbow areas of your shirt or coat, into a tissue or into your sleeve (not your hands). . Avoid shaking hands with others and consider head nods or verbal greetings only. . Avoid touching your eyes, nose, or mouth with unwashed hands.  . Avoid close contact with people who are sick. . Avoid places or events with large numbers of people in one location, like concerts or sporting events. . Carefully consider travel plans you have or are making. . If you are planning any travel outside or inside the [place], visit the CDC's Travelers' Health webpage for the latest health notices. . If you have some symptoms but not all symptoms, continue to monitor at home and seek medical attention if your symptoms worsen. . If you are having a medical emergency, call 911.   ADDITIONAL HEALTHCARE OPTIONS FOR PATIENTS  [facility] / e-Visit: [email or web address]         [facility]: [phone]  [facility]: [phone]                   [facility]: [phone]   

## 2019-01-08 NOTE — Progress Notes (Signed)
Radiation Oncology         [phone] ________________________________  Name: [name] MRN: [id number]  Date: 01/08/2019  DOB: September 28, 1940  Follow-Up Visit Note  CC: [name], MD  Hosie Poisson, MD    ICD-10-CM   1. Stage III squamous cell carcinoma of right lung [facility])  C34.91     Diagnosis:   78 y.o. female with stage III squamous cell carcinoma of the right upper lung (recurrent)  Interval Since Last Radiation:  5 months  Radiation treatment dates:   06/24/18-08/06/18 Site/dose:   Right lung / 60 Gy in 30 fractions of 2 Gy along with radiosensitizing chemotherapy  Radiation treatment dates:   06/11/17-06/21/17 Site/dose:   Right lung / 50 Gy delivered in 5 fractions (SBRT)  Narrative:  The patient returns today for routine follow-up, accompanied by daughter to facilitate information exchange.  Patient's last chest CT scan, dated 12/01/18, showed mild decrease in size of right upper lobe necrotic lung mass. No new or progressive disease identified.  On review of systems, the patient reports occasional cough with white sputum when she bends over. She denies hemoptysis. She denies difficulty swallowing. She denies difficulty breathing or shortness of breath.   ALLERGIES:  is allergic to codeine; benadryl [diphenhydramine]; lipitor [atorvastatin]; and phenergan [promethazine hcl].  Meds: Current Outpatient Medications  Medication Sig Dispense Refill  . aspirin EC 81 MG tablet Take 81 mg by mouth daily.    . Blood Glucose Monitoring Suppl (ONE TOUCH ULTRA 2) w/Device KIT 1 Device by Does not apply route 2 (two) times daily. 1 each 0  . clopidogrel (PLAVIX) 75 MG tablet Take 1 tablet (75 mg total) by mouth daily. 90 tablet 3  . divalproex (DEPAKOTE ER) 500 MG 24 hr tablet TAKE 1 TABLET(500 MG) BY MOUTH DAILY 30 tablet 3  . glucose blood (ONE TOUCH ULTRA TEST) test strip Use as instructed 100 each 12  . Lancets (ONETOUCH ULTRASOFT) lancets Use as instructed 100 each 12   . lidocaine-prilocaine (EMLA) cream Apply 1 application topically as needed. 30 g 0  . lisinopril (PRINIVIL,ZESTRIL) 10 MG tablet Take 1 tablet (10 mg total) by mouth daily. 90 tablet 3  . Multiple Vitamins-Minerals (MULTIVITAMIN PO) Take 1 tablet by mouth daily.    . rosuvastatin (CRESTOR) 20 MG tablet TAKE 1 TABLET BY MOUTH ONCE DAILY (Patient taking differently: Take 20 mg by mouth daily. ) 180 tablet 0  . SitaGLIPtin-MetFORMIN HCl (JANUMET XR) 50-1000 MG TB24 Take 1 tablet by mouth daily. 60 tablet 5  . vitamin B-12 (CYANOCOBALAMIN) 1000 MCG tablet Take 1,000 mcg by mouth daily.    Marland Kitchen ALPRAZolam (XANAX) 0.25 MG tablet Take 1 tablet (0.25 mg total) by mouth every 8 (eight) hours as needed for anxiety (1-2 tablets). (Patient not taking: Reported on 08/12/2018) 6 tablet 0  . lisinopril-hydrochlorothiazide (PRINZIDE,ZESTORETIC) 20-12.5 MG tablet TAKE 1 TABLET BY MOUTH  DAILY (Patient not taking: Reported on 01/08/2019) 90 tablet 1  . prochlorperazine (COMPAZINE) 10 MG tablet Take 1 tablet (10 mg total) by mouth every 6 (six) hours as needed for nausea or vomiting. (Patient not taking: Reported on 09/01/2018) 30 tablet 0   No current facility-administered medications for this encounter.     Physical Findings: The patient is in no acute distress. Patient is alert and oriented.  height is _0  (1.6 m) and weight is 140 lb 2 oz (63.6 kg). Her temporal temperature is 98.5 F (36.9 C). Her blood pressure is 150/68 (abnormal) and  her pulse is 88. Her respiration is 18 and oxygen saturation is 100%.   Lungs are clear to auscultation bilaterally. Heart has regular rate and rhythm. No palpable cervical, supraclavicular, or axillary adenopathy. Abdomen soft, non-tender, normal bowel sounds.  Lab Findings: Lab Results  Component Value Date   WBC 10.2 12/31/2018   HGB 12.4 12/31/2018   HCT 38.2 12/31/2018   MCV 90.7 12/31/2018   PLT 357 12/31/2018    Radiographic Findings: No results found.   Impression:  Stage III squamous cell carcinoma of the right upper lung (recurrent). The patient is recovering from the effects of radiation.  Clinically stable. On physical exam, no evidence of recurrence. Recent chest CT scan shows tumor shrinkage with no new problems. Patient will continue on immunotherapy which she seems to be tolerating well.   Plan:  PRN follow-up in radiation oncology. She will continue close follow-up in medical oncology and continue on immunotherapy.  ____________________________________  Blair Promise, PhD, MD  This document serves as a record of services personally performed by Gery Pray, MD. It was created on his behalf by Rae Lips, a trained medical scribe. The creation of this record is based on the scribe's personal observations and the provider's statements to them. This document has been checked and approved by the attending provider.

## 2019-01-14 ENCOUNTER — Inpatient Hospital Stay: Payer: Medicare Other

## 2019-01-14 ENCOUNTER — Inpatient Hospital Stay: Payer: Medicare Other | Attending: Internal Medicine | Admitting: Physician Assistant

## 2019-01-14 ENCOUNTER — Other Ambulatory Visit: Payer: Self-pay

## 2019-01-14 VITALS — BP 133/59 | HR 91 | Temp 98.5°F | Resp 18 | Ht 63.0 in | Wt 139.8 lb

## 2019-01-14 DIAGNOSIS — Z5112 Encounter for antineoplastic immunotherapy: Secondary | ICD-10-CM | POA: Diagnosis not present

## 2019-01-14 DIAGNOSIS — Z794 Long term (current) use of insulin: Secondary | ICD-10-CM | POA: Diagnosis not present

## 2019-01-14 DIAGNOSIS — Z95828 Presence of other vascular implants and grafts: Secondary | ICD-10-CM

## 2019-01-14 DIAGNOSIS — C3411 Malignant neoplasm of upper lobe, right bronchus or lung: Secondary | ICD-10-CM | POA: Diagnosis not present

## 2019-01-14 DIAGNOSIS — I10 Essential (primary) hypertension: Secondary | ICD-10-CM | POA: Insufficient documentation

## 2019-01-14 DIAGNOSIS — C3491 Malignant neoplasm of unspecified part of right bronchus or lung: Secondary | ICD-10-CM

## 2019-01-14 DIAGNOSIS — Z79899 Other long term (current) drug therapy: Secondary | ICD-10-CM

## 2019-01-14 DIAGNOSIS — Z8673 Personal history of transient ischemic attack (TIA), and cerebral infarction without residual deficits: Secondary | ICD-10-CM | POA: Diagnosis not present

## 2019-01-14 DIAGNOSIS — E119 Type 2 diabetes mellitus without complications: Secondary | ICD-10-CM | POA: Diagnosis not present

## 2019-01-14 DIAGNOSIS — E1169 Type 2 diabetes mellitus with other specified complication: Secondary | ICD-10-CM

## 2019-01-14 DIAGNOSIS — E785 Hyperlipidemia, unspecified: Secondary | ICD-10-CM | POA: Diagnosis not present

## 2019-01-14 DIAGNOSIS — Z7984 Long term (current) use of oral hypoglycemic drugs: Secondary | ICD-10-CM | POA: Insufficient documentation

## 2019-01-14 DIAGNOSIS — Z7982 Long term (current) use of aspirin: Secondary | ICD-10-CM | POA: Insufficient documentation

## 2019-01-14 DIAGNOSIS — Z87891 Personal history of nicotine dependence: Secondary | ICD-10-CM | POA: Insufficient documentation

## 2019-01-14 LAB — CBC WITH DIFFERENTIAL (CANCER CENTER ONLY)
Abs Immature Granulocytes: 0.04 10*3/uL (ref 0.00–0.07)
Basophils Absolute: 0 10*3/uL (ref 0.0–0.1)
Basophils Relative: 0 %
Eosinophils Absolute: 0.2 10*3/uL (ref 0.0–0.5)
Eosinophils Relative: 1 %
HCT: 36.9 % (ref 36.0–46.0)
Hemoglobin: 12 g/dL (ref 12.0–15.0)
Immature Granulocytes: 0 %
Lymphocytes Relative: 18 %
Lymphs Abs: 1.9 10*3/uL (ref 0.7–4.0)
MCH: 29.4 pg (ref 26.0–34.0)
MCHC: 32.5 g/dL (ref 30.0–36.0)
MCV: 90.4 fL (ref 80.0–100.0)
Monocytes Absolute: 0.8 10*3/uL (ref 0.1–1.0)
Monocytes Relative: 8 %
Neutro Abs: 7.7 10*3/uL (ref 1.7–7.7)
Neutrophils Relative %: 73 %
Platelet Count: 360 10*3/uL (ref 150–400)
RBC: 4.08 MIL/uL (ref 3.87–5.11)
RDW: 14.1 % (ref 11.5–15.5)
WBC Count: 10.6 10*3/uL — ABNORMAL HIGH (ref 4.0–10.5)
nRBC: 0 % (ref 0.0–0.2)

## 2019-01-14 LAB — CMP (CANCER CENTER ONLY)
ALT: 19 U/L (ref 0–44)
AST: 15 U/L (ref 15–41)
Albumin: 3.5 g/dL (ref 3.5–5.0)
Alkaline Phosphatase: 73 U/L (ref 38–126)
Anion gap: 13 (ref 5–15)
BUN: 24 mg/dL — ABNORMAL HIGH (ref 8–23)
CO2: 24 mmol/L (ref 22–32)
Calcium: 9.3 mg/dL (ref 8.9–10.3)
Chloride: 101 mmol/L (ref 98–111)
Creatinine: 1.16 mg/dL — ABNORMAL HIGH (ref 0.44–1.00)
GFR, Est AFR Am: 52 mL/min — ABNORMAL LOW (ref >60)
GFR, Estimated: 45 mL/min — ABNORMAL LOW (ref >60)
Glucose, Bld: 275 mg/dL — ABNORMAL HIGH (ref 70–99)
Potassium: 4 mmol/L (ref 3.5–5.1)
Sodium: 138 mmol/L (ref 135–145)
Total Bilirubin: 0.5 mg/dL (ref 0.3–1.2)
Total Protein: 6.8 g/dL (ref 6.5–8.1)

## 2019-01-14 MED ORDER — SODIUM CHLORIDE 0.9% FLUSH
10.0000 mL | INTRAVENOUS | Status: DC | PRN
  Administered 2019-01-14: 10 mL
  Filled 2019-01-14: qty 10

## 2019-01-14 MED ORDER — SODIUM CHLORIDE 0.9 % IV SOLN
Freq: Once | INTRAVENOUS | Status: AC
  Administered 2019-01-14: 16:00:00 via INTRAVENOUS
  Filled 2019-01-14: qty 250

## 2019-01-14 MED ORDER — SODIUM CHLORIDE 0.9% FLUSH
10.0000 mL | INTRAVENOUS | Status: DC | PRN
  Administered 2019-01-14: 17:00:00 10 mL
  Filled 2019-01-14: qty 10

## 2019-01-14 MED ORDER — SODIUM CHLORIDE 0.9 % IV SOLN
10.2000 mg/kg | Freq: Once | INTRAVENOUS | Status: AC
  Administered 2019-01-14: 620 mg via INTRAVENOUS
  Filled 2019-01-14: qty 10

## 2019-01-14 MED ORDER — HEPARIN SOD (PORK) LOCK FLUSH 100 UNIT/ML IV SOLN
500.0000 [IU] | Freq: Once | INTRAVENOUS | Status: AC | PRN
  Administered 2019-01-14: 17:00:00 500 [IU]
  Filled 2019-01-14: qty 5

## 2019-01-14 NOTE — Patient Instructions (Signed)
[facility] Discharge Instructions for Patients Receiving Chemotherapy  Today you received the following chemotherapy agents Durvalumab (IMFINZI).  To help prevent nausea and vomiting after your treatment, we encourage you to take your nausea medication as prescribed.   If you develop nausea and vomiting that is not controlled by your nausea medication, call the clinic.   BELOW ARE SYMPTOMS THAT SHOULD BE REPORTED IMMEDIATELY:  *FEVER GREATER THAN 100.5 F  *CHILLS WITH OR WITHOUT FEVER  NAUSEA AND VOMITING THAT IS NOT CONTROLLED WITH YOUR NAUSEA MEDICATION  *UNUSUAL SHORTNESS OF BREATH  *UNUSUAL BRUISING OR BLEEDING  TENDERNESS IN MOUTH AND THROAT WITH OR WITHOUT PRESENCE OF ULCERS  *URINARY PROBLEMS  *BOWEL PROBLEMS  UNUSUAL RASH Items with * indicate a potential emergency and should be followed up as soon as possible.  Feel free to call the clinic should you have any questions or concerns. The clinic phone number is [phone].  Please show the [facility] at check-in to the Emergency Department and triage nurse.  Coronavirus (COVID-19) Are you at risk?  Are you at risk for the Coronavirus (COVID-19)?  To be considered HIGH RISK for Coronavirus (COVID-19), you have to meet the following criteria:  . Traveled to [place], [place], [place], [place] or [place]; or in the [place] to [place], [place], [place], or [place]; and have fever, cough, and shortness of breath within the last 2 weeks of travel OR . Been in close contact with a person diagnosed with COVID-19 within the last 2 weeks and have fever, cough, and shortness of breath . IF YOU DO NOT MEET THESE CRITERIA, YOU ARE CONSIDERED LOW RISK FOR COVID-19.  What to do if you are HIGH RISK for COVID-19?  Marland Kitchen If you are having a medical emergency, call 911. . Seek medical care right away. Before you go to a doctor's office, urgent care or emergency department, call ahead and tell them  about your recent travel, contact with someone diagnosed with COVID-19, and your symptoms. You should receive instructions from your physician's office regarding next steps of care.  . When you arrive at healthcare provider, tell the healthcare staff immediately you have returned from visiting [place], [place], [place], [place] or [place]; or traveled in the [place] to [place], [place], [place], or [place]; in the last two weeks or you have been in close contact with a person diagnosed with COVID-19 in the last 2 weeks.   . Tell the health care staff about your symptoms: fever, cough and shortness of breath. . After you have been seen by a medical provider, you will be either: o Tested for (COVID-19) and discharged home on quarantine except to seek medical care if symptoms worsen, and asked to  - Stay home and avoid contact with others until you get your results (4-5 days)  - Avoid travel on public transportation if possible (such as bus, train, or airplane) or o Sent to the Emergency Department by EMS for evaluation, COVID-19 testing, and possible admission depending on your condition and test results.  What to do if you are LOW RISK for COVID-19?  Reduce your risk of any infection by using the same precautions used for avoiding the common cold or flu:  Marland Kitchen Wash your hands often with soap and warm water for at least 20 seconds.  If soap and water are not readily available, use an alcohol-based hand sanitizer with at least 60% alcohol.  . If coughing or  sneezing, cover your mouth and nose by coughing or sneezing into the elbow areas of your shirt or coat, into a tissue or into your sleeve (not your hands). . Avoid shaking hands with others and consider head nods or verbal greetings only. . Avoid touching your eyes, nose, or mouth with unwashed hands.  . Avoid close contact with people who are sick. . Avoid places or events with large numbers of people in one location, like concerts or  sporting events. . Carefully consider travel plans you have or are making. . If you are planning any travel outside or inside the [place], visit the CDC's Travelers' Health webpage for the latest health notices. . If you have some symptoms but not all symptoms, continue to monitor at home and seek medical attention if your symptoms worsen. . If you are having a medical emergency, call 911.   [place] / e-Visit: [email or web address]         [facility]: [facility]: [phone]                   [facility]: [phone]

## 2019-01-14 NOTE — Progress Notes (Signed)
[facility] OFFICE PROGRESS NOTE  [name], [place] [address]  DIAGNOSIS: Recurrent non-small cell lung cancer likely squamous cell carcinoma that was initially diagnosed as a stage IIA(T2b, N0, M0) inAugust 2018status post curative stereotactic radiotherapy completed June 21, 2017. The patient presented today with concerning findings for disease recurrence and progression with enlarging and hypermetabolic right upper lobe lung mass presenting as stage IIIa (T3, N0, M0).  PDL 1 expression 0%.  PRIOR THERAPY: 1) Curative stereotactic radiotherapy completed on June 21, 2017. 2) A course of concurrent chemoradiation with chemotherapy consisting of carboplatin for an AUC of 2 and paclitaxel 45 mg/m.First dose started on 06/23/2018.Status post 7 cycles. Last dose of chemotherapy was given August 04, 2018.  CURRENT THERAPY: Consolidation immunotherapy with Imfinzi 10 mg/KG every 2 weeks. First dose September 09, 2018. Status post9cycles.  INTERVAL HISTORY: [name] 78 y.o. female returns to the clinic for a follow-up visit.  The patient is feeling well today without any concerning complaints except she has a few bruises on her extremities which were due to a recent fall.  The patient fell 5 days ago while at the store. She tripped over a box that was on the floor of the store.  The patient is currently taking aspirin and Plavix.  She denies any injuries and did not hit her head. She has a scrape on her left elbow as well as a bruise on her left knee and right forearm.  She is putting peroxide and Neosporin on her scrape on her left elbow.   Otherwise the patient is doing well today.  She has been tolerating her treatment without any adverse side effects.  She denies any fever, chills, night sweats, or weight loss.  She denies any chest pain, shortness of breath, cough, or hemoptysis.  She denies any nausea, vomiting,  diarrhea, or constipation.  She denies any headache or visual changes.  She denies any rashes or skin changes besides the bruises from her fall.  She is here today for evaluation before starting cycle #10.   MEDICAL HISTORY: Past Medical History:  Diagnosis Date  . Cancer ([place])    Lung   . Carotid artery occlusion   . Clostridium difficile infection 06/2013  . Diabetes mellitus    takes Janumet daily  . Dyslipidemia    takes Crestor daily  . Gallstones   . GERD (gastroesophageal reflux disease)   . Heart murmur    hx of  . History of radiation therapy 06/11/17-06/21/17   right lung 50 Gy in 5 fractions  . Hypertension    takes Prinzide and Verapamil daily  . Impaired speech    from stroke  . Neuropathy, diabetic ([place])   . Pneumonia   . PONV (postoperative nausea and vomiting)   . Seizures ([place]) 04/19/2018   recent hospitalization  . Smoker   . Stroke ([place]) 06/25/13  . Vertigo    but doesn't take any meds    ALLERGIES:  is allergic to codeine; benadryl [diphenhydramine]; lipitor [atorvastatin]; and phenergan [promethazine hcl].  MEDICATIONS:  Current Outpatient Medications  Medication Sig Dispense Refill  . ALPRAZolam (XANAX) 0.25 MG tablet Take 1 tablet (0.25 mg total) by mouth every 8 (eight) hours as needed for anxiety (1-2 tablets). (Patient not taking: Reported on 08/12/2018) 6 tablet 0  . aspirin EC 81 MG tablet Take 81 mg by mouth daily.    . Blood Glucose Monitoring Suppl (ONE TOUCH ULTRA 2) w/Device KIT  1 Device by Does not apply route 2 (two) times daily. 1 each 0  . clopidogrel (PLAVIX) 75 MG tablet Take 1 tablet (75 mg total) by mouth daily. 90 tablet 3  . divalproex (DEPAKOTE ER) 500 MG 24 hr tablet TAKE 1 TABLET(500 MG) BY MOUTH DAILY 30 tablet 3  . glucose blood (ONE TOUCH ULTRA TEST) test strip Use as instructed 100 each 12  . Lancets (ONETOUCH ULTRASOFT) lancets Use as instructed 100 each 12  . lidocaine-prilocaine (EMLA) cream Apply 1 application topically  as needed. 30 g 0  . lisinopril (PRINIVIL,ZESTRIL) 10 MG tablet Take 1 tablet (10 mg total) by mouth daily. 90 tablet 3  . lisinopril-hydrochlorothiazide (PRINZIDE,ZESTORETIC) 20-12.5 MG tablet TAKE 1 TABLET BY MOUTH  DAILY (Patient not taking: Reported on 01/08/2019) 90 tablet 1  . Multiple Vitamins-Minerals (MULTIVITAMIN PO) Take 1 tablet by mouth daily.    . prochlorperazine (COMPAZINE) 10 MG tablet Take 1 tablet (10 mg total) by mouth every 6 (six) hours as needed for nausea or vomiting. (Patient not taking: Reported on 09/01/2018) 30 tablet 0  . rosuvastatin (CRESTOR) 20 MG tablet TAKE 1 TABLET BY MOUTH ONCE DAILY (Patient taking differently: Take 20 mg by mouth daily. ) 180 tablet 0  . SitaGLIPtin-MetFORMIN HCl (JANUMET XR) 50-1000 MG TB24 Take 1 tablet by mouth daily. 60 tablet 5  . vitamin B-12 (CYANOCOBALAMIN) 1000 MCG tablet Take 1,000 mcg by mouth daily.     No current facility-administered medications for this visit.     SURGICAL HISTORY:  Past Surgical History:  Procedure Laterality Date  . cataract removed Right   . CHOLECYSTECTOMY    . COLONOSCOPY    . ENDARTERECTOMY Left 07/14/2013   Procedure: ENDARTERECTOMY CAROTID-LEFT;  Surgeon: Elam Dutch, MD;  Location: [facility];  Service: Vascular;  Laterality: Left;  . EYE SURGERY Left    cataract  . IR IMAGING GUIDED PORT INSERTION  09/15/2018  . IR PERC PLEURAL DRAIN W/INDWELL CATH W/IMG GUIDE  05/23/2017  . KNEE SURGERY Left 52yr ago  . LAPAROSCOPIC PARTIAL COLECTOMY N/A 09/02/2015   Procedure: LAPAROSCOPIC PARTIAL RIGHT COLECTOMY;  Surgeon: ALeighton Ruff MD;  Location: [facility];  Service: General;  Laterality: N/A;  . PATCH ANGIOPLASTY Left 07/14/2013   Procedure: LEFT CAROTID ARTERY PATCH ANGIOPLASTY;  Surgeon: CElam Dutch MD;  [facility]  Service: Vascular;  Laterality: Left;  .Marland KitchenVIDEO BRONCHOSCOPY N/A 06/04/2018   Procedure: VIDEO BRONCHOSCOPY;  Surgeon: HMelrose Nakayama MD;  [facility];  Service:  Thoracic;  Laterality: N/A;  . wisdom      REVIEW OF SYSTEMS:   Review of Systems  Constitutional: Negative for appetite change, chills, fatigue, fever and unexpected weight change.  HENT:   Negative for mouth sores, nosebleeds, sore throat and trouble swallowing.   Eyes: Negative for eye problems and icterus.  Respiratory: Negative for cough, hemoptysis, shortness of breath and wheezing.   Cardiovascular: Negative for chest pain and leg swelling.  Gastrointestinal: Negative for abdominal pain, constipation, diarrhea, nausea and vomiting.  Genitourinary: Negative for bladder incontinence, difficulty urinating, dysuria, frequency and hematuria.   Musculoskeletal: Negative for back pain, gait problem, neck pain and neck stiffness.  Skin: Positive for a scrape on her left elbow and bruising on her left knee and wrist, and her right forearm. Negative for itching and rash.  Neurological: Negative for dizziness, extremity weakness, gait problem, headaches, light-headedness and seizures.  Hematological: Negative for adenopathy.  Psychiatric/Behavioral: Negative for confusion, depression and sleep disturbance.  The patient is not nervous/anxious.     PHYSICAL EXAMINATION:  Blood pressure (!) 133/59, pulse 91, temperature 98.5 F (36.9 C), temperature source Oral, resp. rate 18, height 5' 3" (1.6 m), weight 139 lb 12.8 oz (63.4 kg), SpO2 99 %.  ECOG PERFORMANCE STATUS: 1 - Symptomatic but completely ambulatory  Physical Exam  Constitutional: Oriented to person, place, and time and well-developed, well-nourished, and in no distress.  HENT:  Head: Normocephalic and atraumatic.  Mouth/Throat: Oropharynx is clear and moist. No oropharyngeal exudate.  Eyes: Conjunctivae are normal. Right eye exhibits no discharge. Left eye exhibits no discharge. No scleral icterus.  Neck: Normal range of motion. Neck supple.  Cardiovascular: Normal rate, regular rhythm, normal heart sounds and intact distal pulses.    Pulmonary/Chest: Effort normal and breath sounds normal. No respiratory distress. No wheezes. No rales.  Abdominal: Soft. Bowel sounds are normal. Exhibits no distension and no mass. There is no tenderness.  Musculoskeletal: Normal range of motion. Exhibits no edema.  Lymphadenopathy:    No cervical adenopathy.  Neurological: Alert and oriented to person, place, and time. Exhibits normal muscle tone. Gait normal. Coordination normal.  Skin: Scrap on her left elbow. Bruising on her right forearm, left wrist and knee. Skin is warm and dry. No rash noted. Not diaphoretic. No erythema. No pallor.  Psychiatric: Mood, memory and judgment normal.  Vitals reviewed.  LABORATORY DATA: Lab Results  Component Value Date   WBC 10.6 (H) 01/14/2019   HGB 12.0 01/14/2019   HCT 36.9 01/14/2019   MCV 90.4 01/14/2019   PLT 360 01/14/2019      Chemistry      Component Value Date/Time   NA 138 01/14/2019 1346   NA 139 05/07/2018 1147   K 4.0 01/14/2019 1346   CL 101 01/14/2019 1346   CO2 24 01/14/2019 1346   BUN 24 (H) 01/14/2019 1346   BUN 15 05/07/2018 1147   CREATININE 1.16 (H) 01/14/2019 1346   CREATININE 0.87 03/08/2015 0001      Component Value Date/Time   CALCIUM 9.3 01/14/2019 1346   ALKPHOS 73 01/14/2019 1346   AST 15 01/14/2019 1346   ALT 19 01/14/2019 1346   BILITOT 0.5 01/14/2019 1346       RADIOGRAPHIC STUDIES:  No results found.   ASSESSMENT/PLAN:  This is a very pleasant 78 year old Caucasian female with recurrent stage IIIA non-small cell lung cancer, squamous cell carcinoma of the right upper lobe.  She presented initially as a stage IIa in August of 2018.  The patient underwent a course of concurrent chemoradiation with weekly carboplatin and paclitaxel.  She status post 7 cycles.  She tolerated treatment well.  She is currently undergoing consolidation immunotherapy with Imfinzi 10 mg/kg IV every 2 weeks.  She is status post 9 cycles.  She continues to tolerate  her treatment well without any adverse side effects.  Labs were reviewed with the patient today.  I recommend that she proceed with cycle #10 today as scheduled.  I will see her back for follow-up visit in 2 weeks for evaluation before starting cycle #11.  The patient's blood sugar was noted to be elevated on routine labs today.  The patient states that she takes her diabetes medicines as prescribed.  I advised the patient to monitor her blood sugar closely at home and to seek medical evaluation if her blood sugar continues to elevate or if she develops new or worsening symptoms.   The patient was advised to call immediately if  she has any concerning symptoms in the interval. The patient voices understanding of current disease status and treatment options and is in agreement with the current care plan. All questions were answered. The patient knows to call the clinic with any problems, questions or concerns. We can certainly see the patient much sooner if necessary  No orders of the defined types were placed in this encounter.    Cassandra L Heilingoetter, PA-C 01/14/19

## 2019-01-27 ENCOUNTER — Inpatient Hospital Stay: Payer: Medicare Other

## 2019-01-27 ENCOUNTER — Other Ambulatory Visit: Payer: Self-pay

## 2019-01-27 ENCOUNTER — Inpatient Hospital Stay (HOSPITAL_BASED_OUTPATIENT_CLINIC_OR_DEPARTMENT_OTHER): Payer: Medicare Other | Admitting: Physician Assistant

## 2019-01-27 VITALS — BP 125/65 | HR 89 | Temp 98.2°F | Resp 18 | Ht 63.0 in | Wt 139.7 lb

## 2019-01-27 DIAGNOSIS — Z7984 Long term (current) use of oral hypoglycemic drugs: Secondary | ICD-10-CM

## 2019-01-27 DIAGNOSIS — C3491 Malignant neoplasm of unspecified part of right bronchus or lung: Secondary | ICD-10-CM

## 2019-01-27 DIAGNOSIS — Z8673 Personal history of transient ischemic attack (TIA), and cerebral infarction without residual deficits: Secondary | ICD-10-CM | POA: Diagnosis not present

## 2019-01-27 DIAGNOSIS — E785 Hyperlipidemia, unspecified: Secondary | ICD-10-CM

## 2019-01-27 DIAGNOSIS — I10 Essential (primary) hypertension: Secondary | ICD-10-CM | POA: Diagnosis not present

## 2019-01-27 DIAGNOSIS — Z87891 Personal history of nicotine dependence: Secondary | ICD-10-CM

## 2019-01-27 DIAGNOSIS — Z5112 Encounter for antineoplastic immunotherapy: Secondary | ICD-10-CM

## 2019-01-27 DIAGNOSIS — E119 Type 2 diabetes mellitus without complications: Secondary | ICD-10-CM

## 2019-01-27 DIAGNOSIS — Z7982 Long term (current) use of aspirin: Secondary | ICD-10-CM | POA: Diagnosis not present

## 2019-01-27 DIAGNOSIS — Z95828 Presence of other vascular implants and grafts: Secondary | ICD-10-CM

## 2019-01-27 DIAGNOSIS — C3411 Malignant neoplasm of upper lobe, right bronchus or lung: Secondary | ICD-10-CM

## 2019-01-27 DIAGNOSIS — Z79899 Other long term (current) drug therapy: Secondary | ICD-10-CM

## 2019-01-27 DIAGNOSIS — Z794 Long term (current) use of insulin: Secondary | ICD-10-CM | POA: Diagnosis not present

## 2019-01-27 DIAGNOSIS — E1136 Type 2 diabetes mellitus with diabetic cataract: Secondary | ICD-10-CM

## 2019-01-27 LAB — CMP (CANCER CENTER ONLY)
ALT: 14 U/L (ref 0–44)
AST: 13 U/L — ABNORMAL LOW (ref 15–41)
Albumin: 3.5 g/dL (ref 3.5–5.0)
Alkaline Phosphatase: 73 U/L (ref 38–126)
Anion gap: 11 (ref 5–15)
BUN: 18 mg/dL (ref 8–23)
CO2: 29 mmol/L (ref 22–32)
Calcium: 9.4 mg/dL (ref 8.9–10.3)
Chloride: 100 mmol/L (ref 98–111)
Creatinine: 1.07 mg/dL — ABNORMAL HIGH (ref 0.44–1.00)
GFR, Est AFR Am: 58 mL/min — ABNORMAL LOW (ref >60)
GFR, Estimated: 50 mL/min — ABNORMAL LOW (ref >60)
Glucose, Bld: 272 mg/dL — ABNORMAL HIGH (ref 70–99)
Potassium: 4.4 mmol/L (ref 3.5–5.1)
Sodium: 140 mmol/L (ref 135–145)
Total Bilirubin: 0.5 mg/dL (ref 0.3–1.2)
Total Protein: 6.8 g/dL (ref 6.5–8.1)

## 2019-01-27 LAB — CBC WITH DIFFERENTIAL (CANCER CENTER ONLY)
Abs Immature Granulocytes: 0.02 10*3/uL (ref 0.00–0.07)
Basophils Absolute: 0 10*3/uL (ref 0.0–0.1)
Basophils Relative: 0 %
Eosinophils Absolute: 0.2 10*3/uL (ref 0.0–0.5)
Eosinophils Relative: 2 %
HCT: 37.3 % (ref 36.0–46.0)
Hemoglobin: 12.1 g/dL (ref 12.0–15.0)
Immature Granulocytes: 0 %
Lymphocytes Relative: 18 %
Lymphs Abs: 1.8 10*3/uL (ref 0.7–4.0)
MCH: 29.5 pg (ref 26.0–34.0)
MCHC: 32.4 g/dL (ref 30.0–36.0)
MCV: 91 fL (ref 80.0–100.0)
Monocytes Absolute: 0.8 10*3/uL (ref 0.1–1.0)
Monocytes Relative: 8 %
Neutro Abs: 7 10*3/uL (ref 1.7–7.7)
Neutrophils Relative %: 72 %
Platelet Count: 357 10*3/uL (ref 150–400)
RBC: 4.1 MIL/uL (ref 3.87–5.11)
RDW: 13.9 % (ref 11.5–15.5)
WBC Count: 9.9 10*3/uL (ref 4.0–10.5)
nRBC: 0 % (ref 0.0–0.2)

## 2019-01-27 LAB — TSH: TSH: 1.689 u[IU]/mL (ref 0.308–3.960)

## 2019-01-27 MED ORDER — SODIUM CHLORIDE 0.9% FLUSH
10.0000 mL | INTRAVENOUS | Status: DC | PRN
  Administered 2019-01-27: 10 mL
  Filled 2019-01-27: qty 10

## 2019-01-27 MED ORDER — HEPARIN SOD (PORK) LOCK FLUSH 100 UNIT/ML IV SOLN
500.0000 [IU] | Freq: Once | INTRAVENOUS | Status: AC | PRN
  Administered 2019-01-27: 500 [IU]
  Filled 2019-01-27: qty 5

## 2019-01-27 MED ORDER — SODIUM CHLORIDE 0.9 % IV SOLN
Freq: Once | INTRAVENOUS | Status: AC
  Administered 2019-01-27: 16:00:00 via INTRAVENOUS
  Filled 2019-01-27: qty 250

## 2019-01-27 MED ORDER — SODIUM CHLORIDE 0.9 % IV SOLN
10.2000 mg/kg | Freq: Once | INTRAVENOUS | Status: AC
  Administered 2019-01-27: 17:00:00 620 mg via INTRAVENOUS
  Filled 2019-01-27: qty 2.4

## 2019-01-27 NOTE — Progress Notes (Signed)
[facility] OFFICE PROGRESS NOTE  [name], [place] [address]  DIAGNOSIS: Recurrent non-small cell lung cancer likely squamous cell carcinoma that was initially diagnosed as a stage IIA(T2b, N0, M0) inAugust 2018status post curative stereotactic radiotherapy completed June 21, 2017. The patient presented today with concerning findings for disease recurrence and progression with enlarging and hypermetabolic right upper lobe lung mass presenting as stage IIIa (T3, N0, M0).  PDL 1 expression 0%.  PRIOR THERAPY:  1) Curative stereotactic radiotherapy completed on June 21, 2017. 2) A course of concurrent chemoradiation with chemotherapy consisting of carboplatin for an AUC of 2 and paclitaxel 45 mg/m.First dose started on 06/23/2018.Status post 7 cycles. Last dose of chemotherapy was given August 04, 2018.  CURRENT THERAPY: Consolidation immunotherapy with Imfinzi 10 mg/KG every 2 weeks. First dose September 09, 2018. Status post10cycles.  INTERVAL HISTORY: [name] 78 y.o. female returns to the clinic for a follow up visit. The patient is doing well today without any concerning complaints. She continues to tolerate her treatment well without any adverse effects. She denies any fevers, chills, night sweats, or weight loss. She denies any chest pain, shortness of breath, cough, or hemoptysis.  She denies any nausea, vomiting, diarrhea, or constipation. She denies any polyuria, polydipsia, or polyphagia. She denies any headache or visual changes.  She denies any rashes or skin changes. She is here today for evaluation before starting cycle #11.   MEDICAL HISTORY: Past Medical History:  Diagnosis Date  . Cancer ([place])    Lung   . Carotid artery occlusion   . Clostridium difficile infection 06/2013  . Diabetes mellitus    takes Janumet daily  . Dyslipidemia    takes Crestor daily  . Gallstones   . GERD  (gastroesophageal reflux disease)   . Heart murmur    hx of  . History of radiation therapy 06/11/17-06/21/17   right lung 50 Gy in 5 fractions  . Hypertension    takes Prinzide and Verapamil daily  . Impaired speech    from stroke  . Neuropathy, diabetic ([place])   . Pneumonia   . PONV (postoperative nausea and vomiting)   . Seizures ([facility]) 04/19/2018   recent hospitalization  . Smoker   . Stroke ([place]) 06/25/13  . Vertigo    but doesn't take any meds    ALLERGIES:  is allergic to codeine; benadryl [diphenhydramine]; lipitor [atorvastatin]; and phenergan [promethazine hcl].  MEDICATIONS:  Current Outpatient Medications  Medication Sig Dispense Refill  . ALPRAZolam (XANAX) 0.25 MG tablet Take 1 tablet (0.25 mg total) by mouth every 8 (eight) hours as needed for anxiety (1-2 tablets). (Patient not taking: Reported on 08/12/2018) 6 tablet 0  . aspirin EC 81 MG tablet Take 81 mg by mouth daily.    . Blood Glucose Monitoring Suppl (ONE TOUCH ULTRA 2) w/Device KIT 1 Device by Does not apply route 2 (two) times daily. 1 each 0  . clopidogrel (PLAVIX) 75 MG tablet Take 1 tablet (75 mg total) by mouth daily. 90 tablet 3  . divalproex (DEPAKOTE ER) 500 MG 24 hr tablet TAKE 1 TABLET(500 MG) BY MOUTH DAILY 30 tablet 3  . glucose blood (ONE TOUCH ULTRA TEST) test strip Use as instructed 100 each 12  . Lancets (ONETOUCH ULTRASOFT) lancets Use as instructed 100 each 12  . lidocaine-prilocaine (EMLA) cream Apply 1 application topically as needed. 30 g 0  . lisinopril (PRINIVIL,ZESTRIL) 10 MG tablet Take 1 tablet (10  mg total) by mouth daily. 90 tablet 3  . lisinopril-hydrochlorothiazide (PRINZIDE,ZESTORETIC) 20-12.5 MG tablet TAKE 1 TABLET BY MOUTH  DAILY (Patient not taking: Reported on 01/08/2019) 90 tablet 1  . Multiple Vitamins-Minerals (MULTIVITAMIN PO) Take 1 tablet by mouth daily.    . prochlorperazine (COMPAZINE) 10 MG tablet Take 1 tablet (10 mg total) by mouth every 6 (six) hours as needed for  nausea or vomiting. (Patient not taking: Reported on 09/01/2018) 30 tablet 0  . rosuvastatin (CRESTOR) 20 MG tablet TAKE 1 TABLET BY MOUTH ONCE DAILY (Patient taking differently: Take 20 mg by mouth daily. ) 180 tablet 0  . SitaGLIPtin-MetFORMIN HCl (JANUMET XR) 50-1000 MG TB24 Take 1 tablet by mouth daily. 60 tablet 5  . vitamin B-12 (CYANOCOBALAMIN) 1000 MCG tablet Take 1,000 mcg by mouth daily.     No current facility-administered medications for this visit.    Facility-Administered Medications Ordered in Other Visits  Medication Dose Route Frequency Provider Last Rate Last Dose  . sodium chloride flush (NS) 0.9 % injection 10 mL  10 mL Intracatheter PRN Curt Bears, MD   10 mL at 01/27/19 1413    SURGICAL HISTORY:  Past Surgical History:  Procedure Laterality Date  . cataract removed Right   . CHOLECYSTECTOMY    . COLONOSCOPY    . ENDARTERECTOMY Left 07/14/2013   Procedure: ENDARTERECTOMY CAROTID-LEFT;  Surgeon: Elam Dutch, MD;  Location: [facility];  Service: Vascular;  Laterality: Left;  . EYE SURGERY Left    cataract  . IR IMAGING GUIDED PORT INSERTION  09/15/2018  . IR PERC PLEURAL DRAIN W/INDWELL CATH W/IMG GUIDE  05/23/2017  . KNEE SURGERY Left 35yr ago  . LAPAROSCOPIC PARTIAL COLECTOMY N/A 09/02/2015   Procedure: LAPAROSCOPIC PARTIAL RIGHT COLECTOMY;  Surgeon: ALeighton Ruff MD;  Location: [facility];  Service: General;  Laterality: N/A;  . PATCH ANGIOPLASTY Left 07/14/2013   Procedure: LEFT CAROTID ARTERY PATCH ANGIOPLASTY;  Surgeon: CElam Dutch MD;  [place] [facility]  Service: Vascular;  Laterality: Left;  .Marland KitchenVIDEO BRONCHOSCOPY N/A 06/04/2018   Procedure: VIDEO BRONCHOSCOPY;  Surgeon: HMelrose Nakayama MD;  [facility];  Service: Thoracic;  Laterality: N/A;  . wisdom      REVIEW OF SYSTEMS:   Review of Systems  Constitutional: Negative for appetite change, chills, fatigue, fever and unexpected weight change.  HENT:   Negative for mouth sores, nosebleeds,  sore throat and trouble swallowing.   Eyes: Negative for eye problems and icterus.  Respiratory: Negative for cough, hemoptysis, shortness of breath and wheezing.   Cardiovascular: Negative for chest pain and leg swelling.  Gastrointestinal: Negative for abdominal pain, constipation, diarrhea, nausea and vomiting.  Genitourinary: Negative for bladder incontinence, difficulty urinating, dysuria, frequency and hematuria.   Musculoskeletal: Negative for back pain, gait problem, neck pain and neck stiffness.  Skin: Negative for itching and rash.  Neurological: Negative for dizziness, extremity weakness, gait problem, headaches, light-headedness and seizures.  Hematological: Negative for adenopathy. Does not bruise/bleed easily.  Psychiatric/Behavioral: Negative for confusion, depression and sleep disturbance. The patient is not nervous/anxious.     PHYSICAL EXAMINATION:  There were no vitals taken for this visit.  ECOG PERFORMANCE STATUS: 1 - Symptomatic but completely ambulatory  Physical Exam  Constitutional: Oriented to person, place, and time and well-developed, well-nourished, and in no distress.  HENT:  Head: Normocephalic and atraumatic.  Mouth/Throat: Oropharynx is clear and moist. No oropharyngeal exudate.  Eyes: Conjunctivae are normal. Right eye exhibits no discharge. Left eye exhibits  no discharge. No scleral icterus.  Neck: Normal range of motion. Neck supple.  Cardiovascular: Normal rate, regular rhythm, normal heart sounds and intact distal pulses.   Pulmonary/Chest: Effort normal and breath sounds normal. No respiratory distress. No wheezes. No rales.  Abdominal: Soft. Bowel sounds are normal. Exhibits no distension and no mass. There is no tenderness.  Musculoskeletal: Normal range of motion. Exhibits no edema.  Lymphadenopathy:    No cervical adenopathy.  Neurological: Alert and oriented to person, place, and time. Exhibits normal muscle tone. Gait normal. Coordination  normal.  Skin: Skin is warm and dry. No rash noted. Not diaphoretic. No erythema. No pallor.  Psychiatric: Mood, memory and judgment normal.  Vitals reviewed.  LABORATORY DATA: Lab Results  Component Value Date   WBC 9.9 01/27/2019   HGB 12.1 01/27/2019   HCT 37.3 01/27/2019   MCV 91.0 01/27/2019   PLT 357 01/27/2019      Chemistry      Component Value Date/Time   NA 138 01/14/2019 1346   NA 139 05/07/2018 1147   K 4.0 01/14/2019 1346   CL 101 01/14/2019 1346   CO2 24 01/14/2019 1346   BUN 24 (H) 01/14/2019 1346   BUN 15 05/07/2018 1147   CREATININE 1.16 (H) 01/14/2019 1346   CREATININE 0.87 03/08/2015 0001      Component Value Date/Time   CALCIUM 9.3 01/14/2019 1346   ALKPHOS 73 01/14/2019 1346   AST 15 01/14/2019 1346   ALT 19 01/14/2019 1346   BILITOT 0.5 01/14/2019 1346       RADIOGRAPHIC STUDIES:  No results found.   ASSESSMENT/PLAN:  This is a very pleasant 78 year old Caucasian female with recurrent stage IIIA non-small cell lung cancer, squamous cell carcinoma of the right upper lobe. She presented initially as a stage IIa in August of 2018.  The patient underwent a course of concurrent chemoradiation with weekly carboplatin and paclitaxel.  She status post 7 cycles.  She tolerated treatment well.  She is currently undergoing consolidation immunotherapy with Imfinzi 10 mg/kg IV every 2 weeks.  She is status post 9 cycles.  She continues to tolerate her treatment well without any adverse side effects.  Labs were reviewed with the patient today.  I recommend that she proceed with cycle #11 today as scheduled.  I will see her back for follow-up visit in 2 weeks for evaluation before starting cycle #12.  The patient's blood sugar was noted to be elevated on routine labs today.  The patient states that she takes her diabetes medicines as prescribed.  I advised the patient to monitor her blood sugar closely at home and to seek medical evaluation if her blood  sugar continues to elevate or if she develops new or worsening symptoms. If her blood sugars continue to be elevated, I advised the patient to follow up with her PCP for further evaluation and management of her diabetes medications.  The patient was advised to call immediately if she has any concerning symptoms in the interval. The patient voices understanding of current disease status and treatment options and is in agreement with the current care plan. All questions were answered. The patient knows to call the clinic with any problems, questions or concerns. We can certainly see the patient much sooner if necessary No orders of the defined types were placed in this encounter.    Cassandra L Heilingoetter, PA-C 01/27/19

## 2019-01-27 NOTE — Patient Instructions (Signed)

## 2019-01-27 NOTE — Patient Instructions (Signed)
[facility] Discharge Instructions for Patients Receiving Chemotherapy  Today you received the following chemotherapy agents: Imfinzi.  To help prevent nausea and vomiting after your treatment, we encourage you to take your nausea medication as directed.   If you develop nausea and vomiting that is not controlled by your nausea medication, call the clinic.   BELOW ARE SYMPTOMS THAT SHOULD BE REPORTED IMMEDIATELY:  *FEVER GREATER THAN 100.5 F  *CHILLS WITH OR WITHOUT FEVER  NAUSEA AND VOMITING THAT IS NOT CONTROLLED WITH YOUR NAUSEA MEDICATION  *UNUSUAL SHORTNESS OF BREATH  *UNUSUAL BRUISING OR BLEEDING  TENDERNESS IN MOUTH AND THROAT WITH OR WITHOUT PRESENCE OF ULCERS  *URINARY PROBLEMS  *BOWEL PROBLEMS  UNUSUAL RASH Items with * indicate a potential emergency and should be followed up as soon as possible.  Feel free to call the clinic should you have any questions or concerns. The clinic phone number is [phone].  Please show the CHEMO ALERT CARD at check-in to the Emergency Department and triage nurse.   

## 2019-01-28 ENCOUNTER — Telehealth: Payer: Self-pay | Admitting: Internal Medicine

## 2019-01-28 ENCOUNTER — Telehealth: Payer: Self-pay | Admitting: Neurology

## 2019-01-28 ENCOUNTER — Ambulatory Visit (INDEPENDENT_AMBULATORY_CARE_PROVIDER_SITE_OTHER): Payer: Medicare Other | Admitting: Neurology

## 2019-01-28 ENCOUNTER — Encounter: Payer: Self-pay | Admitting: Neurology

## 2019-01-28 VITALS — BP 134/66 | HR 83 | Temp 97.8°F | Wt 140.4 lb

## 2019-01-28 DIAGNOSIS — R930 Abnormal findings on diagnostic imaging of skull and head, not elsewhere classified: Secondary | ICD-10-CM

## 2019-01-28 MED ORDER — DIVALPROEX SODIUM ER 500 MG PO TB24: ORAL_TABLET | ORAL | 5 refills | Status: DC

## 2019-01-28 NOTE — Patient Instructions (Signed)
I had a long discussion with the patient and her daughter regarding an abnormal MRI of the brain need to repeat MRI with and without contrast to look for interval improvement in her lesion.  Continue Depakote ER 500 mg daily as it seems to be helping with her headaches as well as episodes of typical migraine versus seizures which appear to have resolved.  She will continue follow-up with her cancer doctor for her lung cancer.  She will return for follow-up with me in 6 months or call earlier if necessary.

## 2019-01-28 NOTE — Telephone Encounter (Signed)
[employer] medicare order sent to GI. No auth they will reach out to the patient to schedule.  

## 2019-01-28 NOTE — Progress Notes (Signed)
[facility] [address] [place]. [place] [address] [phone]       OFFICE FOLLOW UP VISIT NOTE  [name] Date of Birth:  Jul 13, 1941 Medical Record Number:  [id number]   Referring MD: Murray Hodgkins  Reason for Referral: Numbness  GYB:[facility] consult: 05/07/2018 : [name] is a pleasant 78 year old Caucasian lady with past medical history of left frontal MCA branch infarct in December 2014, hypertension, hyperlipidemia, diabetes, diabetic neuropathy who has been having recurrent episodes of transient face and left hand numbness.  History is obtained from the patient and daughter and review of electronic medical records.  I have personally reviewed imaging films.  The daughter states that since April 2019 she has been having frequent episodes in which she starts with slight blurred vision in the left eye followed by her left cheek and left hand numbness.  Her left hand goes to sleep.  She has some trouble speaking because of this episode last typically 15 to 20 minutes.  There is no headache preceding or during these episodes though some of them have been followed by mild headache.  She is fully awake and aware of her surroundings during these episodes.  There are no specific triggers for these episodes and they occur at a variable frequency but may occur several times a month.  She has been hospitalized a few times for these episodes but brain imaging and EEG studies have been unyielding.  Most recently MRI scan of the brain on 04/20/2018 showed old left frontal I per intensity which was felt to be old left frontal infarct.  This was unchanged from prior scans from 2015 though in retrospect when I reviewed the initial MRI from December 2014 which showed the infarct it was much smaller than the present size of the old infarct.  Patient has not had any postcontrast images.  CT angiograms on 04/09/2018 of the brain and neck both showed no significant extracranial or  intracranial stenosis.  Transthoracic echo on 9/26 at 19 was normal.  LDL cholesterol was 36 mg percent and hemoglobin A1c was elevated at 9.4.  EEG done on 04/21/2018 was normal.  These episodes occur at a frequency of every 3 to 4 weeks the mostly involve the left hand and left face but only rarely right hand has also been involved.  The patient feels at times that her left hand may be set drawn up though she denies any weakness or involuntary jerking or movements.  Patient was recently started on Keppra 500 twice daily 2 weeks ago but states she is still had 2 more episodes last Sunday.  Patient has been slightly confused following the last episode and disoriented and daughter feels she has not yet returned back to baseline.  Patient has a history of non-small cell cancer in the right upper lobe of the lung.  She refused surgery and was treated with radiation only.  She recently had a PET scan of the body done this morning the report of which is pending.  She has not been on chemotherapy for her cancer. Update 08/12/2018 : She returns for follow-up after last visit 3 months ago.  She is accompanied by her daughter.  They report no further episodes of paresthesias or other seizure-like of migraine-like episodes.  She is tolerating Depakote ER well without any side effects.  She did have MRI scan of the brain with and without contrast on 06/01/2018 which I personally reviewed showed no acute abnormality or any abnormal enhancing lesion  however she had a left frontal nonspecific white matter hyperintensity which actually appear to be slightly larger in size compared to the previous MRI from January 2015.  She was recently diagnosed with metastatic lung cancer and is presently undergoing chemotherapy and radiation and sees Dr. Julien Nordmann at the cancer center.  She has a follow-up CT scan scheduled on few weeks an appointment to see Dr. Drusilla Kanner to discuss further treatment.  She has no new neurological complaints.  She  continues to have mild short-term memory and cognitive difficulties which appear to be unchanged and are not progressive.  She denies significant headaches or focal neurological deficits. Update 01/27/2019 : She returns for follow-up after last visit 6 months ago.  She is accompanied by her daughter.  Patient states she is doing well she has not had any further episodes of paresthesia or seizure-like episodes since starting Depakote ER which is tolerating well without side effects.  She has discontinued the Keppra without any breakthrough episodes.  She states her short-term memory difficulties and cognitive issues appear stable and not progressive.  She was recently seen by her cancer doctor and right upper lobe necrotic mass appeared to be shrinking but there was a new spot found in the liver.  They are planning on doing MRI surveillance in a few months.  She has no new complaints today. ROS:   14 system review of systems is positive for shortness of breath, bruising, lung cancer and all other systems negative  PMH:  Past Medical History:  Diagnosis Date   Cancer ([place])    Lung    Carotid artery occlusion    Clostridium difficile infection 06/2013   Diabetes mellitus    takes Janumet daily   Dyslipidemia    takes Crestor daily   Gallstones    GERD (gastroesophageal reflux disease)    Heart murmur    hx of   History of radiation therapy 06/11/17-06/21/17   right lung 50 Gy in 5 fractions   Hypertension    takes Prinzide and Verapamil daily   Impaired speech    from stroke   Neuropathy, diabetic ([facility])    Pneumonia    PONV (postoperative nausea and vomiting)    Seizures ([place]) 04/19/2018   recent hospitalization   Smoker    Stroke ([place]) 06/25/13   Vertigo    but doesn't take any meds    Social History:  Social History   Socioeconomic History   Marital status: Widowed    Spouse name: james   Number of children: 3   Years of education: 10   Highest education  level: Not on file  Occupational History   Occupation: retired  Scientist, product/process development strain: Not on file   Food insecurity    Worry: Not on file    Inability: Not on Lexicographer needs    Medical: Not on file    Non-medical: Not on file  Tobacco Use   Smoking status: Current Every Day Smoker    Packs/day: 0.50    Years: 60.00    Pack years: 30.00    Types: Cigarettes   Smokeless tobacco: Never Used  Substance and Sexual Activity   Alcohol use: No    Alcohol/week: 0.0 standard drinks   Drug use: No   Sexual activity: Yes  Lifestyle   Physical activity    Days per week: Not on file    Minutes per session: Not on file   Stress: Not on file  Relationships   Social Herbalist on phone: Not on file    Gets together: Not on file    Attends religious service: Not on file    Active member of club or organization: Not on file    Attends meetings of clubs or organizations: Not on file    Relationship status: Not on file   Intimate partner violence    Fear of current or ex partner: Not on file    Emotionally abused: Not on file    Physically abused: Not on file    Forced sexual activity: Not on file  Other Topics Concern   Not on file  Social History Narrative   Patient is married with 3 children.   Patient is right handed.   Patient has 10 th grade education.   Patient drinks 4 cups daily.    Medications:   Current Outpatient Medications on File Prior to Visit  Medication Sig Dispense Refill   aspirin EC 81 MG tablet Take 81 mg by mouth daily.     Blood Glucose Monitoring Suppl (ONE TOUCH ULTRA 2) w/Device KIT 1 Device by Does not apply route 2 (two) times daily. 1 each 0   clopidogrel (PLAVIX) 75 MG tablet Take 1 tablet (75 mg total) by mouth daily. 90 tablet 3   glucose blood (ONE TOUCH ULTRA TEST) test strip Use as instructed 100 each 12   Lancets (ONETOUCH ULTRASOFT) lancets Use as instructed 100 each 12    lidocaine-prilocaine (EMLA) cream Apply 1 application topically as needed. 30 g 0   lisinopril (PRINIVIL,ZESTRIL) 10 MG tablet Take 1 tablet (10 mg total) by mouth daily. 90 tablet 3   Multiple Vitamins-Minerals (MULTIVITAMIN PO) Take 1 tablet by mouth daily.     prochlorperazine (COMPAZINE) 10 MG tablet Take 1 tablet (10 mg total) by mouth every 6 (six) hours as needed for nausea or vomiting. 30 tablet 0   rosuvastatin (CRESTOR) 20 MG tablet TAKE 1 TABLET BY MOUTH ONCE DAILY (Patient taking differently: Take 20 mg by mouth daily. ) 180 tablet 0   SitaGLIPtin-MetFORMIN HCl (JANUMET XR) 50-1000 MG TB24 Take 1 tablet by mouth daily. 60 tablet 5   vitamin B-12 (CYANOCOBALAMIN) 1000 MCG tablet Take 1,000 mcg by mouth daily.     No current facility-administered medications on file prior to visit.     Allergies:   Allergies  Allergen Reactions   Codeine Nausea And Vomiting   Benadryl [Diphenhydramine] Other (See Comments)    BENADRYL IV makes pt hallucinate   Lipitor [Atorvastatin] Nausea And Vomiting   Phenergan [Promethazine Hcl] Other (See Comments)    Confusion, hallucinations, severe agitation    Physical Exam General: Frail cachectic looking elderly Caucasian lady, seated, in no evident distress Head: head normocephalic and atraumatic.   Neck: supple with no carotid or supraclavicular bruits Cardiovascular: regular rate and rhythm, no murmurs Musculoskeletal: no deformity Skin:  no rash/petichiae Vascular:  Normal pulses all extremities  Neurologic Exam Mental Status: Awake and fully alert. Oriented to place and time. Recent and remote memory poor. Attention span, concentration and fund of knowledge diminished. Mood and affect appropriate.  Mini-Mental status exam not done.  Cranial Nerves: Fundoscopic exam reveals sharp disc margins. Pupils equal, briskly reactive to light. Extraocular movements full without nystagmus. Visual fields full to confrontation. Hearing intact.  Facial sensation intact. Face, tongue, palate moves normally and symmetrically.  Motor: Normal bulk and tone. Normal strength in all tested extremity muscles. Sensory.: intact to touch ,  pinprick , position and vibratory sensation.  Coordination: Rapid alternating movements normal in all extremities. Finger-to-nose and heel-to-shin performed accurately bilaterally. Gait and Station: Arises from chair without difficulty. Stance is broad-based l. Gait demonstrates mild imbalance.  Unable to stand on either foot unsupported or on a narrow base..  Not able to heel, toe and tandem walk without significant difficulty.  Reflexes: 1+ and symmetric. Toes downgoing.      ASSESSMENT: 78 year old lady with recurrent transient episodes of mostly left face and hand paresthesias of unclear etiology.  which appear to have responded to Depakote.  Recent cognitive impairment and memory difficulties and gait imbalance also appears stable remote history of left frontal MCA branch infarct in December 2014.  She also has history of metastatic lung cancer and is on chemotherapy     PLAN: I had a long discussion with the patient and her daughter regarding an abnormal MRI of the brain need to repeat MRI with and without contrast to look for interval improvement in her lesion.  Continue Depakote ER 500 mg daily as it seems to be helping with her headaches as well as episodes of typical migraine versus seizures which appear to have resolved.  She will continue follow-up with her cancer doctor for her lung cancer.  She will return for follow-up with me in 6 months or call earlier if necessary.Greater than 50% time during this 30-minute  visit was spent on counseling and coordination of care about her numbness episodes, cognitive impairment and answering questions   Antony Contras, MD  [facility] [address] [place], [place] [address]  Phone [phone] Fax [phone] Note: This  document was prepared with digital dictation and possible smart phrase technology. Any transcriptional errors that result from this process are unintentional.

## 2019-01-28 NOTE — Telephone Encounter (Signed)
Added additional appts per 7/14 los - pt to get an updated schedule next visit.

## 2019-02-11 ENCOUNTER — Inpatient Hospital Stay: Payer: Medicare Other

## 2019-02-11 ENCOUNTER — Encounter: Payer: Self-pay | Admitting: Physician Assistant

## 2019-02-11 ENCOUNTER — Other Ambulatory Visit: Payer: Self-pay

## 2019-02-11 ENCOUNTER — Inpatient Hospital Stay (HOSPITAL_BASED_OUTPATIENT_CLINIC_OR_DEPARTMENT_OTHER): Payer: Medicare Other | Admitting: Physician Assistant

## 2019-02-11 ENCOUNTER — Other Ambulatory Visit (INDEPENDENT_AMBULATORY_CARE_PROVIDER_SITE_OTHER): Payer: Medicare Other

## 2019-02-11 VITALS — BP 125/57 | HR 95 | Temp 98.9°F | Resp 18 | Ht 63.0 in | Wt 141.7 lb

## 2019-02-11 DIAGNOSIS — E1169 Type 2 diabetes mellitus with other specified complication: Secondary | ICD-10-CM

## 2019-02-11 DIAGNOSIS — Z794 Long term (current) use of insulin: Secondary | ICD-10-CM

## 2019-02-11 DIAGNOSIS — Z7984 Long term (current) use of oral hypoglycemic drugs: Secondary | ICD-10-CM | POA: Diagnosis not present

## 2019-02-11 DIAGNOSIS — I10 Essential (primary) hypertension: Secondary | ICD-10-CM

## 2019-02-11 DIAGNOSIS — Z79899 Other long term (current) drug therapy: Secondary | ICD-10-CM

## 2019-02-11 DIAGNOSIS — Z87891 Personal history of nicotine dependence: Secondary | ICD-10-CM

## 2019-02-11 DIAGNOSIS — E119 Type 2 diabetes mellitus without complications: Secondary | ICD-10-CM | POA: Diagnosis not present

## 2019-02-11 DIAGNOSIS — C3411 Malignant neoplasm of upper lobe, right bronchus or lung: Secondary | ICD-10-CM

## 2019-02-11 DIAGNOSIS — Z7982 Long term (current) use of aspirin: Secondary | ICD-10-CM

## 2019-02-11 DIAGNOSIS — C3491 Malignant neoplasm of unspecified part of right bronchus or lung: Secondary | ICD-10-CM

## 2019-02-11 DIAGNOSIS — Z95828 Presence of other vascular implants and grafts: Secondary | ICD-10-CM

## 2019-02-11 DIAGNOSIS — E785 Hyperlipidemia, unspecified: Secondary | ICD-10-CM

## 2019-02-11 DIAGNOSIS — Z5112 Encounter for antineoplastic immunotherapy: Secondary | ICD-10-CM | POA: Diagnosis not present

## 2019-02-11 DIAGNOSIS — Z8673 Personal history of transient ischemic attack (TIA), and cerebral infarction without residual deficits: Secondary | ICD-10-CM | POA: Diagnosis not present

## 2019-02-11 DIAGNOSIS — E538 Deficiency of other specified B group vitamins: Secondary | ICD-10-CM | POA: Diagnosis not present

## 2019-02-11 LAB — CBC WITH DIFFERENTIAL (CANCER CENTER ONLY)
Abs Immature Granulocytes: 0.04 10*3/uL (ref 0.00–0.07)
Basophils Absolute: 0 10*3/uL (ref 0.0–0.1)
Basophils Relative: 0 %
Eosinophils Absolute: 0.3 10*3/uL (ref 0.0–0.5)
Eosinophils Relative: 3 %
HCT: 35.1 % — ABNORMAL LOW (ref 36.0–46.0)
Hemoglobin: 11.5 g/dL — ABNORMAL LOW (ref 12.0–15.0)
Immature Granulocytes: 0 %
Lymphocytes Relative: 17 %
Lymphs Abs: 1.8 10*3/uL (ref 0.7–4.0)
MCH: 29.7 pg (ref 26.0–34.0)
MCHC: 32.8 g/dL (ref 30.0–36.0)
MCV: 90.7 fL (ref 80.0–100.0)
Monocytes Absolute: 0.8 10*3/uL (ref 0.1–1.0)
Monocytes Relative: 7 %
Neutro Abs: 7.9 10*3/uL — ABNORMAL HIGH (ref 1.7–7.7)
Neutrophils Relative %: 73 %
Platelet Count: 341 10*3/uL (ref 150–400)
RBC: 3.87 MIL/uL (ref 3.87–5.11)
RDW: 13.5 % (ref 11.5–15.5)
WBC Count: 10.8 10*3/uL — ABNORMAL HIGH (ref 4.0–10.5)
nRBC: 0 % (ref 0.0–0.2)

## 2019-02-11 LAB — CMP (CANCER CENTER ONLY)
ALT: 10 U/L (ref 0–44)
AST: 11 U/L — ABNORMAL LOW (ref 15–41)
Albumin: 3.2 g/dL — ABNORMAL LOW (ref 3.5–5.0)
Alkaline Phosphatase: 66 U/L (ref 38–126)
Anion gap: 12 (ref 5–15)
BUN: 15 mg/dL (ref 8–23)
CO2: 26 mmol/L (ref 22–32)
Calcium: 9.3 mg/dL (ref 8.9–10.3)
Chloride: 102 mmol/L (ref 98–111)
Creatinine: 1.07 mg/dL — ABNORMAL HIGH (ref 0.44–1.00)
GFR, Est AFR Am: 58 mL/min — ABNORMAL LOW (ref >60)
GFR, Estimated: 50 mL/min — ABNORMAL LOW (ref >60)
Glucose, Bld: 304 mg/dL — ABNORMAL HIGH (ref 70–99)
Potassium: 4 mmol/L (ref 3.5–5.1)
Sodium: 140 mmol/L (ref 135–145)
Total Bilirubin: 0.5 mg/dL (ref 0.3–1.2)
Total Protein: 6.5 g/dL (ref 6.5–8.1)

## 2019-02-11 MED ORDER — SODIUM CHLORIDE 0.9% FLUSH
10.0000 mL | INTRAVENOUS | Status: DC | PRN
  Administered 2019-02-11: 16:00:00 10 mL
  Filled 2019-02-11: qty 10

## 2019-02-11 MED ORDER — CYANOCOBALAMIN 1000 MCG/ML IJ SOLN
1000.0000 ug | Freq: Once | INTRAMUSCULAR | Status: AC
  Administered 2019-02-11: 1000 ug via INTRAMUSCULAR

## 2019-02-11 MED ORDER — HEPARIN SOD (PORK) LOCK FLUSH 100 UNIT/ML IV SOLN
500.0000 [IU] | Freq: Once | INTRAVENOUS | Status: AC | PRN
  Administered 2019-02-11: 500 [IU]
  Filled 2019-02-11: qty 5

## 2019-02-11 MED ORDER — SODIUM CHLORIDE 0.9 % IV SOLN
10.3000 mg/kg | Freq: Once | INTRAVENOUS | Status: AC
  Administered 2019-02-11: 620 mg via INTRAVENOUS
  Filled 2019-02-11: qty 10

## 2019-02-11 MED ORDER — SODIUM CHLORIDE 0.9 % IV SOLN
Freq: Once | INTRAVENOUS | Status: AC
  Administered 2019-02-11: 14:00:00 via INTRAVENOUS
  Filled 2019-02-11: qty 250

## 2019-02-11 MED ORDER — INSULIN REGULAR HUMAN 100 UNIT/ML IJ SOLN
5.0000 [IU] | Freq: Once | INTRAMUSCULAR | Status: AC
  Administered 2019-02-11: 15:00:00 5 [IU] via SUBCUTANEOUS
  Filled 2019-02-11: qty 10

## 2019-02-11 MED ORDER — SODIUM CHLORIDE 0.9% FLUSH
10.0000 mL | INTRAVENOUS | Status: DC | PRN
  Administered 2019-02-11: 13:00:00 10 mL
  Filled 2019-02-11: qty 10

## 2019-02-11 NOTE — Patient Instructions (Signed)
[facility] Discharge Instructions for Patients Receiving Chemotherapy  Today you received the following chemotherapy agents: Imfinzi.  To help prevent nausea and vomiting after your treatment, we encourage you to take your nausea medication as directed.   If you develop nausea and vomiting that is not controlled by your nausea medication, call the clinic.   BELOW ARE SYMPTOMS THAT SHOULD BE REPORTED IMMEDIATELY:  *FEVER GREATER THAN 100.5 F  *CHILLS WITH OR WITHOUT FEVER  NAUSEA AND VOMITING THAT IS NOT CONTROLLED WITH YOUR NAUSEA MEDICATION  *UNUSUAL SHORTNESS OF BREATH  *UNUSUAL BRUISING OR BLEEDING  TENDERNESS IN MOUTH AND THROAT WITH OR WITHOUT PRESENCE OF ULCERS  *URINARY PROBLEMS  *BOWEL PROBLEMS  UNUSUAL RASH Items with * indicate a potential emergency and should be followed up as soon as possible.  Feel free to call the clinic should you have any questions or concerns. The clinic phone number is [phone].  Please show the CHEMO ALERT CARD at check-in to the Emergency Department and triage nurse.   

## 2019-02-11 NOTE — Progress Notes (Signed)
[place] OFFICE PROGRESS NOTE  [name], [place] [id number]  DIAGNOSIS: Recurrent non-small cell lung cancer likely squamous cell carcinoma that was initially diagnosed as a stage IIA(T2b, N0, M0) inAugust 2018status post curative stereotactic radiotherapy completed June 21, 2017. The patient presented today with concerning findings for disease recurrence and progression with enlarging and hypermetabolic right upper lobe lung mass presenting as stage IIIa (T3, N0, M0).  PDL 1 expression 0%.  PRIOR THERAPY: 1) Curative stereotactic radiotherapy completed on June 21, 2017. 2) A course of concurrent chemoradiation with chemotherapy consisting of carboplatin for an AUC of 2 and paclitaxel 45 mg/m.First dose started on 06/23/2018.Status post 7 cycles. Last dose of chemotherapy was given August 04, 2018.  CURRENT THERAPY: Consolidation immunotherapy with Imfinzi 10 mg/KG every 2 weeks. First dose September 09, 2018. Status post12cycles.  INTERVAL HISTORY: [name] 78 y.o. female returns to the clinic for a follow up visit. The patient is doing well today without any concerning complaints. She continues to tolerate her treatment well without any adverse effects. She denies any fevers, chills, night sweats, or weight loss. She denies any She denies any chest pain, shortness of breath, cough, or hemoptysis. She denies any sore throat or nasal congestion. She denies any nausea, vomiting, diarrhea, or constipation. She denies any polyuria, polydipsia, or polyphagia. She denies any headache or visual changes. She denies any rashes or skin changes. She is here today for evaluation before starting cycle #12.  MEDICAL HISTORY: Past Medical History:  Diagnosis Date  . Cancer ([place])    Lung   . Carotid artery occlusion   . Clostridium difficile infection 06/2013  . Diabetes mellitus    takes Janumet daily  . Dyslipidemia     takes Crestor daily  . Gallstones   . GERD (gastroesophageal reflux disease)   . Heart murmur    hx of  . History of radiation therapy 06/11/17-06/21/17   right lung 50 Gy in 5 fractions  . Hypertension    takes Prinzide and Verapamil daily  . Impaired speech    from stroke  . Neuropathy, diabetic ([place])   . Pneumonia   . PONV (postoperative nausea and vomiting)   . Seizures ([place]) 04/19/2018   recent hospitalization  . Smoker   . Stroke ([place]) 06/25/13  . Vertigo    but doesn't take any meds    ALLERGIES:  is allergic to codeine; benadryl [diphenhydramine]; lipitor [atorvastatin]; and phenergan [promethazine hcl].  MEDICATIONS:  Current Outpatient Medications  Medication Sig Dispense Refill  . aspirin EC 81 MG tablet Take 81 mg by mouth daily.    . Blood Glucose Monitoring Suppl (ONE TOUCH ULTRA 2) w/Device KIT 1 Device by Does not apply route 2 (two) times daily. 1 each 0  . clopidogrel (PLAVIX) 75 MG tablet Take 1 tablet (75 mg total) by mouth daily. 90 tablet 3  . divalproex (DEPAKOTE ER) 500 MG 24 hr tablet Take one tablet daily 30 tablet 5  . glucose blood (ONE TOUCH ULTRA TEST) test strip Use as instructed 100 each 12  . Lancets (ONETOUCH ULTRASOFT) lancets Use as instructed 100 each 12  . lidocaine-prilocaine (EMLA) cream Apply 1 application topically as needed. 30 g 0  . lisinopril (PRINIVIL,ZESTRIL) 10 MG tablet Take 1 tablet (10 mg total) by mouth daily. 90 tablet 3  . Multiple Vitamins-Minerals (MULTIVITAMIN PO) Take 1 tablet by mouth daily.    . rosuvastatin (CRESTOR) 20 MG tablet TAKE  1 TABLET BY MOUTH ONCE DAILY (Patient taking differently: Take 20 mg by mouth daily. ) 180 tablet 0  . SitaGLIPtin-MetFORMIN HCl (JANUMET XR) 50-1000 MG TB24 Take 1 tablet by mouth daily. 60 tablet 5  . vitamin B-12 (CYANOCOBALAMIN) 1000 MCG tablet Take 1,000 mcg by mouth daily.    . prochlorperazine (COMPAZINE) 10 MG tablet Take 1 tablet (10 mg total) by mouth every 6 (six) hours as  needed for nausea or vomiting. (Patient not taking: Reported on 02/11/2019) 30 tablet 0   No current facility-administered medications for this visit.    Facility-Administered Medications Ordered in Other Visits  Medication Dose Route Frequency Provider Last Rate Last Dose  . durvalumab (IMFINZI) 620 mg in sodium chloride 0.9 % 100 mL chemo infusion  10.3 mg/kg (Treatment Plan Recorded) Intravenous Once Curt Bears, MD      . heparin lock flush 100 unit/mL  500 Units Intracatheter Once PRN Curt Bears, MD      . sodium chloride flush (NS) 0.9 % injection 10 mL  10 mL Intracatheter PRN Curt Bears, MD        SURGICAL HISTORY:  Past Surgical History:  Procedure Laterality Date  . cataract removed Right   . CHOLECYSTECTOMY    . COLONOSCOPY    . ENDARTERECTOMY Left 07/14/2013   Procedure: ENDARTERECTOMY CAROTID-LEFT;  Surgeon: Elam Dutch, MD;  Location: [facility];  Service: Vascular;  Laterality: Left;  . EYE SURGERY Left    cataract  . IR IMAGING GUIDED PORT INSERTION  09/15/2018  . IR PERC PLEURAL DRAIN W/INDWELL CATH W/IMG GUIDE  05/23/2017  . KNEE SURGERY Left 40yr ago  . LAPAROSCOPIC PARTIAL COLECTOMY N/A 09/02/2015   Procedure: LAPAROSCOPIC PARTIAL RIGHT COLECTOMY;  Surgeon: ALeighton Ruff MD;  Location: [facility];  Service: General;  Laterality: N/A;  . PATCH ANGIOPLASTY Left 07/14/2013   Procedure: LEFT CAROTID ARTERY PATCH ANGIOPLASTY;  Surgeon: CElam Dutch MD;  Location: [facility]  Service: Vascular;  Laterality: Left;  .Marland KitchenVIDEO BRONCHOSCOPY N/A 06/04/2018   Procedure: VIDEO BRONCHOSCOPY;  Surgeon: HMelrose Nakayama MD;  [facility];  Service: Thoracic;  Laterality: N/A;  . wisdom      REVIEW OF SYSTEMS:   Review of Systems  Constitutional: Negative for appetite change, chills, fatigue, fever and unexpected weight change.  HENT:   Negative for mouth sores, nosebleeds, sore throat and trouble swallowing.   Eyes: Negative for eye problems and icterus.   Respiratory: Negative for cough, hemoptysis, shortness of breath and wheezing.   Cardiovascular: Negative for chest pain and leg swelling.  Gastrointestinal: Negative for abdominal pain, constipation, diarrhea, nausea and vomiting.  Genitourinary: Negative for bladder incontinence, difficulty urinating, dysuria, frequency and hematuria.   Musculoskeletal: Negative for back pain, gait problem, neck pain and neck stiffness.  Skin: Negative for itching and rash.  Neurological: Negative for dizziness, extremity weakness, gait problem, headaches, light-headedness and seizures.  Hematological: Negative for adenopathy. Does not bruise/bleed easily.  Psychiatric/Behavioral: Negative for confusion, depression and sleep disturbance. The patient is not nervous/anxious.     PHYSICAL EXAMINATION:  Blood pressure (!) 125/57, pulse 95, temperature 98.9 F (37.2 C), resp. rate 18, height '5\' 3"'  (1.6 m), weight 141 lb 11.2 oz (64.3 kg), SpO2 97 %.  ECOG PERFORMANCE STATUS: 1 - Symptomatic but completely ambulatory  Physical Exam  Constitutional: Oriented to person, place, and time and well-developed, well-nourished, and in no distress. No distress.  HENT:  Head: Normocephalic and atraumatic.  Mouth/Throat: Oropharynx is clear and  moist. No oropharyngeal exudate.  Eyes: Conjunctivae are normal. Right eye exhibits no discharge. Left eye exhibits no discharge. No scleral icterus.  Neck: Normal range of motion. Neck supple.  Cardiovascular: Normal rate, regular rhythm, normal heart sounds and intact distal pulses.   Pulmonary/Chest: Effort normal and breath sounds normal. No respiratory distress. No wheezes. No rales.  Abdominal: Soft. Bowel sounds are normal. Exhibits no distension and no mass. There is no tenderness.  Musculoskeletal: Normal range of motion. Exhibits no edema.  Lymphadenopathy:    No cervical adenopathy.  Neurological: Alert and oriented to person, place, and time. Exhibits normal muscle  tone. Gait normal. Coordination normal.  Skin: Skin is warm and dry. No rash noted. Not diaphoretic. No erythema. No pallor.  Psychiatric: Mood, memory and judgment normal.  Vitals reviewed.  LABORATORY DATA: Lab Results  Component Value Date   WBC 10.8 (H) 02/11/2019   HGB 11.5 (L) 02/11/2019   HCT 35.1 (L) 02/11/2019   MCV 90.7 02/11/2019   PLT 341 02/11/2019      Chemistry      Component Value Date/Time   NA 140 02/11/2019 1326   NA 139 05/07/2018 1147   K 4.0 02/11/2019 1326   CL 102 02/11/2019 1326   CO2 26 02/11/2019 1326   BUN 15 02/11/2019 1326   BUN 15 05/07/2018 1147   CREATININE 1.07 (H) 02/11/2019 1326   CREATININE 0.87 03/08/2015 0001      Component Value Date/Time   CALCIUM 9.3 02/11/2019 1326   ALKPHOS 66 02/11/2019 1326   AST 11 (L) 02/11/2019 1326   ALT 10 02/11/2019 1326   BILITOT 0.5 02/11/2019 1326       RADIOGRAPHIC STUDIES:  No results found.   ASSESSMENT/PLAN:  This is a very pleasant 78 year old Caucasian female with recurrentstage IIIAnon-small cell lung cancer, squamous cell carcinomaof the right upper lobe. She presented initially as a stage IIa in August of 2018.  The patient underwent a course of concurrent chemoradiation with weekly carboplatin and paclitaxel. She status post 7 cycles. She tolerated treatment well.  She is currently undergoing consolidation immunotherapy with Imfinzi 10 mg/kg IV every 2 weeks. She is status post 11 cycles. She continues to tolerate her treatment well without any adverse side effects.   The patient was seen with Dr. Julien Nordmann today.Labs were reviewed with the patient today. I recommend that she proceed with cycle #12 today as scheduled.  I will arrange for a restaging CT scan of the chest to be performed prior to her next visit. We will also order a scan of the abdomen and pelvis to further evaluate the small, solid-appearing enhancing lesion arising from upper pole of the left kidney which was  seen on her prior CT exam.  I will see her back for follow-up visit in 2 weeks for evaluation and to review her scan results before starting cycle #13.  The patient's blood sugar was noted to be elevated on routine labs today. The patient states that she takes her diabetesmedicinesas prescribed. We will give her 5 units of insulin.I advised the patient to monitor her blood sugar closely at home and to seek medical evaluation if her blood sugar continues to elevate or if she develops new or worsening symptoms.If her blood sugars continue to be elevated, I advised the patient to follow up with her PCP for further evaluation and management of her diabetes medications.  The patient was advised to call immediately if she has any concerning symptoms in the interval. The  patient voices understanding of current disease status and treatment options and is in agreement with the current care plan. All questions were answered. The patient knows to call the clinic with any problems, questions or concerns. We can certainly see the patient much sooner if necessary No orders of the defined types were placed in this encounter.  Orders Placed This Encounter  Procedures  . CT Chest W Contrast    Standing Status:   Future    Standing Expiration Date:   02/11/2020    Order Specific Question:   ** REASON FOR EXAM (FREE TEXT)    Answer:   Restaging lung cancer    Order Specific Question:   If indicated for the ordered procedure, I authorize the administration of contrast media per Radiology protocol    Answer:   Yes    Order Specific Question:   Preferred imaging location?    Answer:   [facility]    Order Specific Question:   Radiology Contrast Protocol - do NOT remove file path    Answer:   \\[email or web address]  . CT Abdomen Pelvis W Contrast    Standing Status:   Future    Standing Expiration Date:   02/11/2020    Order Specific Question:   ** REASON FOR EXAM (FREE TEXT)     Answer:   Restaging Lung Cancer    Order Specific Question:   If indicated for the ordered procedure, I authorize the administration of contrast media per Radiology protocol    Answer:   Yes    Order Specific Question:   Preferred imaging location?    Answer:   [facility]    Order Specific Question:   Is Oral Contrast requested for this exam?    Answer:   Yes, Per Radiology protocol    Order Specific Question:   Radiology Contrast Protocol - do NOT remove file path    Answer:   \\[email or web address]     Taeshaun Rames L Eleuterio Dollar, PA-C 02/11/19  ADDENDUM: Hematology/Oncology Attending: I had a face-to-face encounter with the patient today.  I recommended her care plan.  This is a very pleasant 78 years old white female with recurrent non-small cell lung cancer, squamous cell carcinoma status post induction concurrent chemoradiation and she is currently undergoing consolidation treatment with immunotherapy with Imfinzi status post 11 cycles.  The patient has been tolerating this treatment well with no concerning adverse effects.  She continues to have uncontrolled diabetes mellitus with elevated blood glucose on her lab today. I recommended for the patient to proceed with cycle #12 today as planned.  She will have repeat CT scan of the chest before her visit in 2 weeks for restaging of her disease. For the hyperglycemia we advised the patient to take her antidiabetic medication as prescribed and we will give her a dose of regular insulin 5 unit subcutaneously today. She was advised to call immediately if she has any concerning symptoms in the interval.  Disclaimer: This note was dictated with voice recognition software. Similar sounding words can inadvertently be transcribed and may be missed upon review. Eilleen Kempf, MD 02/11/19

## 2019-02-18 ENCOUNTER — Other Ambulatory Visit: Payer: Self-pay

## 2019-02-18 ENCOUNTER — Encounter (HOSPITAL_COMMUNITY): Payer: Self-pay

## 2019-02-18 ENCOUNTER — Ambulatory Visit (HOSPITAL_COMMUNITY)
Admission: RE | Admit: 2019-02-18 | Discharge: 2019-02-18 | Disposition: A | Discharge status: Home / Self Care | Payer: Medicare Other | Source: Ambulatory Visit | Attending: Physician Assistant | Admitting: Physician Assistant

## 2019-02-18 DIAGNOSIS — C3491 Malignant neoplasm of unspecified part of right bronchus or lung: Secondary | ICD-10-CM | POA: Insufficient documentation

## 2019-02-18 DIAGNOSIS — J90 Pleural effusion, not elsewhere classified: Secondary | ICD-10-CM | POA: Diagnosis not present

## 2019-02-18 DIAGNOSIS — N289 Disorder of kidney and ureter, unspecified: Secondary | ICD-10-CM | POA: Diagnosis not present

## 2019-02-18 DIAGNOSIS — C3411 Malignant neoplasm of upper lobe, right bronchus or lung: Secondary | ICD-10-CM | POA: Diagnosis not present

## 2019-02-18 MED ORDER — HEPARIN SOD (PORK) LOCK FLUSH 100 UNIT/ML IV SOLN
INTRAVENOUS | Status: AC
  Administered 2019-02-18: 08:00:00 500 [IU] via INTRAVENOUS
  Filled 2019-02-18: qty 5

## 2019-02-18 MED ORDER — SODIUM CHLORIDE (PF) 0.9 % IJ SOLN
INTRAMUSCULAR | Status: AC
  Filled 2019-02-18: qty 50

## 2019-02-18 MED ORDER — IOHEXOL 300 MG/ML  SOLN
100.0000 mL | Freq: Once | INTRAMUSCULAR | Status: AC | PRN
  Administered 2019-02-18: 100 mL via INTRAVENOUS

## 2019-02-18 MED ORDER — HEPARIN SOD (PORK) LOCK FLUSH 100 UNIT/ML IV SOLN
500.0000 [IU] | Freq: Once | INTRAVENOUS | Status: AC
  Administered 2019-02-18: 08:00:00 500 [IU] via INTRAVENOUS

## 2019-02-19 ENCOUNTER — Ambulatory Visit
Admission: RE | Admit: 2019-02-19 | Discharge: 2019-02-19 | Disposition: A | Discharge status: Home / Self Care | Payer: Medicare Other | Source: Ambulatory Visit | Attending: Neurology | Admitting: Neurology

## 2019-02-19 DIAGNOSIS — R930 Abnormal findings on diagnostic imaging of skull and head, not elsewhere classified: Secondary | ICD-10-CM | POA: Diagnosis not present

## 2019-02-19 MED ORDER — GADOBENATE DIMEGLUMINE 529 MG/ML IV SOLN
12.0000 mL | Freq: Once | INTRAVENOUS | Status: AC | PRN
  Administered 2019-02-19: 12 mL via INTRAVENOUS

## 2019-02-23 ENCOUNTER — Other Ambulatory Visit: Payer: Self-pay

## 2019-02-23 ENCOUNTER — Encounter: Payer: Self-pay | Admitting: Family Medicine

## 2019-02-23 ENCOUNTER — Ambulatory Visit (INDEPENDENT_AMBULATORY_CARE_PROVIDER_SITE_OTHER): Payer: Medicare Other | Admitting: Family Medicine

## 2019-02-23 VITALS — BP 138/86 | HR 86 | Temp 97.3°F | Ht 66.0 in | Wt 141.2 lb

## 2019-02-23 DIAGNOSIS — I10 Essential (primary) hypertension: Secondary | ICD-10-CM

## 2019-02-23 DIAGNOSIS — E1136 Type 2 diabetes mellitus with diabetic cataract: Secondary | ICD-10-CM

## 2019-02-23 DIAGNOSIS — E1159 Type 2 diabetes mellitus with other circulatory complications: Secondary | ICD-10-CM

## 2019-02-23 DIAGNOSIS — E1169 Type 2 diabetes mellitus with other specified complication: Secondary | ICD-10-CM | POA: Diagnosis not present

## 2019-02-23 DIAGNOSIS — E785 Hyperlipidemia, unspecified: Secondary | ICD-10-CM

## 2019-02-23 DIAGNOSIS — G459 Transient cerebral ischemic attack, unspecified: Secondary | ICD-10-CM

## 2019-02-23 DIAGNOSIS — C3491 Malignant neoplasm of unspecified part of right bronchus or lung: Secondary | ICD-10-CM | POA: Diagnosis not present

## 2019-02-23 LAB — POCT GLYCOSYLATED HEMOGLOBIN (HGB A1C): Hemoglobin A1C: 9.9 % — AB (ref 4.0–5.6)

## 2019-02-23 MED ORDER — PIOGLITAZONE HCL 30 MG PO TABS: 30.0000 mg | ORAL_TABLET | Freq: Every day | ORAL | 1 refills | Status: DC

## 2019-02-23 NOTE — Progress Notes (Signed)
[name] is a 78 y.o. female who presents for annual wellness visit and follow-up on chronic medical conditions.  She is followed by oncology as well as neurology and ophthalmology.  She has not seen her ophthalmologist in quite some time and her daughter will make an appointment for that.  She does check her blood sugars periodically and they run in the 150 range fasting.  Her exercise is quite minimal.  Her medications were reviewed.  She is taking them regularly.  Her daughter ensures that she does take them.  Presently she is having no difficulty with chest pain, shortness of breath, neurologic complaints.  Psychologically C seems to be doing fairly well.  Immunizations and Health Maintenance Immunization History  Administered Date(s) Administered  . Influenza Split 06/12/2011, 06/10/2012  . Influenza Whole 06/28/2009  . Influenza, High Dose Seasonal PF 06/09/2013, 06/29/2014, 07/05/2015, 05/18/2016, 05/01/2017, 04/17/2018  . Pneumococcal Conjugate-13 06/28/2009  . Pneumococcal Polysaccharide-23 02/10/2014  . Tdap 07/16/2005  . Zoster 10/10/2011   Health Maintenance Due  Topic Date Due  . OPHTHALMOLOGY EXAM  04/16/2015  . TETANUS/TDAP  07/17/2015  . FOOT EXAM  03/07/2016  . INFLUENZA VACCINE  02/14/2019    Last Pap smear:  Age d out  Last mammogram: aged out  Last colonoscopy: 06-24-15 Last DEXA: 06-24-2007 Dentist: three years  Ophtho: over one year Exercise: staying active  Other doctors caring for patient include:[facility] oncology, Dr. Leonie Walker neurology.[facility]  Advanced directives:no; info given Does Patient Have a Medical Advance Directive?: No Would patient like information on creating a medical advance directive?: Yes (MAU/Ambulatory/Procedural Areas - Information given)  Depression screen:  See questionnaire below.  Depression screen [facility] 2/9 02/23/2019 05/02/2018 04/24/2018 03/10/2018 07/22/2017  Decreased Interest 0 0 0 0 0  Down, Depressed, Hopeless 0 0 0  0 0  PHQ - 2 Score 0 0 0 0 0  Some recent data might be hidden    Fall Risk Screen: see questionnaire below. Fall Risk  02/23/2019 01/28/2019 06/17/2018 05/07/2018 05/02/2018  Falls in the past year? 1 1 0 No No  Number falls in past yr: 0 0 0 - -  Injury with Fall? 0 0 0 - -    ADL screen:  See questionnaire below Functional Status Survey: Is the patient deaf or have difficulty hearing?: No Does the patient have difficulty seeing, even when wearing glasses/contacts?: No Does the patient have difficulty concentrating, remembering, or making decisions?: No Does the patient have difficulty walking or climbing stairs?: No Does the patient have difficulty dressing or bathing?: No Does the patient have difficulty doing errands alone such as visiting a doctor's office or shopping?: Yes(well helped by daughter)   Review of Systems Constitutional: -, -unexpected weight change, -anorexia, -fatigue Neurology: -, -numbness, , -memory loss, -falls, -dizziness    PHYSICAL EXAM:  BP 138/86 (BP Location: Left Arm, Patient Position: Sitting)   Pulse 86   Temp (!) 97.3 F (36.3 C)   Ht 5\' 6"  (1.676 m)   Wt 141 lb 3.2 oz (64 kg)   SpO2 96%   BMI 22.79 kg/m   General Appearance: Alert, cooperative, no distress, appears stated age Head: Normocephalic, without obvious abnormality, atraumatic Eyes: PERRL, conjunctiva/corneas clear, EOM's intact, fundi benign Ears: Normal TM's and external ear canals Nose: Nares normal, mucosa normal, no drainage or sinus tenderness Throat: Lips, mucosa, and tongue normal; teeth and gums normal Neck: Supple, no lymphadenopathy;  thyroid:  no enlargement/tenderness/nodules; no carotid bruit or JVD Lungs: Clear to  auscultation bilaterally without wheezes, rales or ronchi; respirations unlabored Heart: Regular rate and rhythm, S1 and S2 normal, no murmur, rubor gallop Skin:  Skin color, texture, turgor normal, no rashes or lesions Lymph nodes: Cervical,  supraclavicular, and axillary nodes normal Neurologic:  CNII-XII intact, normal strength, sensation and gait; reflexes 2+ and symmetric throughout Psych: Normal mood, affect, hygiene and grooming.  ASSESSMENT/PLAN: Non-small cell cancer of right lung (HCC) - Plan: She will continue to be followed by Dr. Earlie Walker  Hypertension associated with diabetes [facility] - Plan: Continue present medications  DM type 2 with diabetic dyslipidemia ([name]) - Plan: pioglitazone (ACTOS) 30 MG tablet, A1c was elevated and therefore Actos was added.  Discussed possible additional medications with her next visit.  Cataract associated with type 2 diabetes mellitus ([place]) - Plan: The daughter will set up an appointment with Dr. Carolynn Walker  Hyperlipidemia associated with type 2 diabetes mellitus ([place]) - Plan: Continue present medication  TIA (transient ischemic attack) - Plan: Continue aspirin and Plavix Recommend getting Tdap and Shingrix at the pharmacy.  Recheck here 4 months.  Medicare Attestation I have personally reviewed: The patient's medical and social history Their use of alcohol, tobacco or illicit drugs Their current medications and supplements The patient's functional ability including ADLs,fall risks, home safety risks, cognitive, and hearing and visual impairment Diet and physical activities Evidence for depression or mood disorders  The patient's weight, height, and BMI have been recorded in the chart.  I have made referrals, counseling, and provided education to the patient based on review of the above and I have provided the patient with a written personalized care plan for preventive services.     Jill Alexanders, MD   02/23/2019

## 2019-02-23 NOTE — Addendum Note (Signed)
Addended by: Elyse Jarvis on: 02/23/2019 10:29 AM   Modules accepted: Orders

## 2019-02-23 NOTE — Patient Instructions (Signed)
  Ms. Stemler , Thank you for taking time to come for your Medicare Wellness Visit. I appreciate your ongoing commitment to your health goals. Please review the following plan we discussed and let me know if I can assist you in the future.   These are the goals we discussed: Set up an appointment to see Dr. Carolynn Sayers and also get the tetanus as well as shingles shots This is a list of the screening recommended for you and due dates:  Health Maintenance  Topic Date Due  . Eye exam for diabetics  04/16/2015  . Tetanus Vaccine  07/17/2015  . Complete foot exam   03/07/2016  . Flu Shot  02/14/2019  . Hemoglobin A1C  06/12/2019  . DEXA scan (bone density measurement)  Completed  . Pneumonia vaccines  Completed

## 2019-02-24 ENCOUNTER — Inpatient Hospital Stay: Payer: Medicare Other

## 2019-02-24 ENCOUNTER — Inpatient Hospital Stay (HOSPITAL_BASED_OUTPATIENT_CLINIC_OR_DEPARTMENT_OTHER): Payer: Medicare Other | Admitting: Internal Medicine

## 2019-02-24 ENCOUNTER — Other Ambulatory Visit: Payer: Self-pay

## 2019-02-24 ENCOUNTER — Encounter: Payer: Self-pay | Admitting: Internal Medicine

## 2019-02-24 ENCOUNTER — Inpatient Hospital Stay: Payer: Medicare Other | Attending: Internal Medicine

## 2019-02-24 VITALS — BP 128/53 | HR 85 | Temp 97.8°F | Resp 17 | Ht 66.0 in | Wt 140.1 lb

## 2019-02-24 DIAGNOSIS — Z79899 Other long term (current) drug therapy: Secondary | ICD-10-CM | POA: Insufficient documentation

## 2019-02-24 DIAGNOSIS — Z9221 Personal history of antineoplastic chemotherapy: Secondary | ICD-10-CM | POA: Insufficient documentation

## 2019-02-24 DIAGNOSIS — Z8673 Personal history of transient ischemic attack (TIA), and cerebral infarction without residual deficits: Secondary | ICD-10-CM | POA: Diagnosis not present

## 2019-02-24 DIAGNOSIS — Z87891 Personal history of nicotine dependence: Secondary | ICD-10-CM | POA: Insufficient documentation

## 2019-02-24 DIAGNOSIS — Z7984 Long term (current) use of oral hypoglycemic drugs: Secondary | ICD-10-CM | POA: Insufficient documentation

## 2019-02-24 DIAGNOSIS — C3491 Malignant neoplasm of unspecified part of right bronchus or lung: Secondary | ICD-10-CM

## 2019-02-24 DIAGNOSIS — I10 Essential (primary) hypertension: Secondary | ICD-10-CM | POA: Insufficient documentation

## 2019-02-24 DIAGNOSIS — Z7982 Long term (current) use of aspirin: Secondary | ICD-10-CM | POA: Insufficient documentation

## 2019-02-24 DIAGNOSIS — Z5112 Encounter for antineoplastic immunotherapy: Secondary | ICD-10-CM | POA: Diagnosis not present

## 2019-02-24 DIAGNOSIS — E119 Type 2 diabetes mellitus without complications: Secondary | ICD-10-CM | POA: Diagnosis not present

## 2019-02-24 DIAGNOSIS — Z9049 Acquired absence of other specified parts of digestive tract: Secondary | ICD-10-CM | POA: Diagnosis not present

## 2019-02-24 DIAGNOSIS — C3411 Malignant neoplasm of upper lobe, right bronchus or lung: Secondary | ICD-10-CM | POA: Diagnosis not present

## 2019-02-24 DIAGNOSIS — Z923 Personal history of irradiation: Secondary | ICD-10-CM | POA: Diagnosis not present

## 2019-02-24 DIAGNOSIS — Z95828 Presence of other vascular implants and grafts: Secondary | ICD-10-CM

## 2019-02-24 LAB — TSH: TSH: 1.824 u[IU]/mL (ref 0.308–3.960)

## 2019-02-24 LAB — CMP (CANCER CENTER ONLY)
ALT: 12 U/L (ref 0–44)
AST: 14 U/L — ABNORMAL LOW (ref 15–41)
Albumin: 3.7 g/dL (ref 3.5–5.0)
Alkaline Phosphatase: 65 U/L (ref 38–126)
Anion gap: 12 (ref 5–15)
BUN: 20 mg/dL (ref 8–23)
CO2: 25 mmol/L (ref 22–32)
Calcium: 9.6 mg/dL (ref 8.9–10.3)
Chloride: 102 mmol/L (ref 98–111)
Creatinine: 0.97 mg/dL (ref 0.44–1.00)
GFR, Est AFR Am: >60 mL/min (ref >60)
GFR, Estimated: 56 mL/min — ABNORMAL LOW (ref >60)
Glucose, Bld: 217 mg/dL — ABNORMAL HIGH (ref 70–99)
Potassium: 3.9 mmol/L (ref 3.5–5.1)
Sodium: 139 mmol/L (ref 135–145)
Total Bilirubin: 0.7 mg/dL (ref 0.3–1.2)
Total Protein: 7.4 g/dL (ref 6.5–8.1)

## 2019-02-24 LAB — CBC WITH DIFFERENTIAL (CANCER CENTER ONLY)
Abs Immature Granulocytes: 0.03 10*3/uL (ref 0.00–0.07)
Basophils Absolute: 0 10*3/uL (ref 0.0–0.1)
Basophils Relative: 0 %
Eosinophils Absolute: 0.3 10*3/uL (ref 0.0–0.5)
Eosinophils Relative: 3 %
HCT: 37.2 % (ref 36.0–46.0)
Hemoglobin: 12.2 g/dL (ref 12.0–15.0)
Immature Granulocytes: 0 %
Lymphocytes Relative: 18 %
Lymphs Abs: 1.9 10*3/uL (ref 0.7–4.0)
MCH: 30 pg (ref 26.0–34.0)
MCHC: 32.8 g/dL (ref 30.0–36.0)
MCV: 91.6 fL (ref 80.0–100.0)
Monocytes Absolute: 0.9 10*3/uL (ref 0.1–1.0)
Monocytes Relative: 9 %
Neutro Abs: 7.2 10*3/uL (ref 1.7–7.7)
Neutrophils Relative %: 70 %
Platelet Count: 392 10*3/uL (ref 150–400)
RBC: 4.06 MIL/uL (ref 3.87–5.11)
RDW: 13.6 % (ref 11.5–15.5)
WBC Count: 10.3 10*3/uL (ref 4.0–10.5)
nRBC: 0 % (ref 0.0–0.2)

## 2019-02-24 MED ORDER — SODIUM CHLORIDE 0.9 % IV SOLN
10.3000 mg/kg | Freq: Once | INTRAVENOUS | Status: AC
  Administered 2019-02-24: 620 mg via INTRAVENOUS
  Filled 2019-02-24: qty 10

## 2019-02-24 MED ORDER — HEPARIN SOD (PORK) LOCK FLUSH 100 UNIT/ML IV SOLN
500.0000 [IU] | Freq: Once | INTRAVENOUS | Status: AC | PRN
  Administered 2019-02-24: 500 [IU]
  Filled 2019-02-24: qty 5

## 2019-02-24 MED ORDER — SODIUM CHLORIDE 0.9 % IV SOLN
Freq: Once | INTRAVENOUS | Status: AC
  Administered 2019-02-24: 14:00:00 via INTRAVENOUS
  Filled 2019-02-24: qty 250

## 2019-02-24 MED ORDER — SODIUM CHLORIDE 0.9% FLUSH
10.0000 mL | INTRAVENOUS | Status: DC | PRN
  Administered 2019-02-24: 10 mL
  Filled 2019-02-24: qty 10

## 2019-02-24 NOTE — Patient Instructions (Signed)
[facility] Discharge Instructions for Patients Receiving Chemotherapy  Today you received the following chemotherapy agents: Imfinzi.  To help prevent nausea and vomiting after your treatment, we encourage you to take your nausea medication as directed.   If you develop nausea and vomiting that is not controlled by your nausea medication, call the clinic.   BELOW ARE SYMPTOMS THAT SHOULD BE REPORTED IMMEDIATELY:  *FEVER GREATER THAN 100.5 F  *CHILLS WITH OR WITHOUT FEVER  NAUSEA AND VOMITING THAT IS NOT CONTROLLED WITH YOUR NAUSEA MEDICATION  *UNUSUAL SHORTNESS OF BREATH  *UNUSUAL BRUISING OR BLEEDING  TENDERNESS IN MOUTH AND THROAT WITH OR WITHOUT PRESENCE OF ULCERS  *URINARY PROBLEMS  *BOWEL PROBLEMS  UNUSUAL RASH Items with * indicate a potential emergency and should be followed up as soon as possible.  Feel free to call the clinic should you have any questions or concerns. The clinic phone number is [phone].  Please show the CHEMO ALERT CARD at check-in to the Emergency Department and triage nurse.   

## 2019-02-24 NOTE — Progress Notes (Signed)
[place] [phone]   [phone]  OFFICE PROGRESS NOTE  [name], MD [place] [address]  DIAGNOSIS:Recurrent non-small cell lung cancer likely squamous cell carcinoma that was initially diagnosed as a stage IIA(T2b, N0, M0) inAugust 2018status post curative stereotactic radiotherapy completed June 21, 2017. The patient presented today with concerning findings for disease recurrence and progression with enlarging and hypermetabolic right upper lobe lung mass presenting as stage IIIa (T3, N0, M0).  PDL 1 expression 0%.  PRIOR THERAPY: 1) Curative stereotactic radiotherapy completed on June 21, 2017. 2) A course of concurrent chemoradiation with chemotherapy consisting of carboplatin for an AUC of 2 and paclitaxel 45 mg/m.  First dose started on 06/23/2018.  Status post 7 cycles.  Last dose of chemotherapy was given August 04, 2018.  CURRENT THERAPY:Consolidation immunotherapy with Imfinzi 10 mg/KG every 2 weeks.  First dose September 09, 2018.  Status post 12 cycles.  INTERVAL HISTORY: [name] 78 y.o. female returns to the clinic today for follow-up visit.  The patient is feeling fine today with no concerning complaints.  She denied having any chest pain, shortness of breath, cough or hemoptysis.  She denied having any recent weight loss or night sweats.  She has no nausea, vomiting, diarrhea or constipation.  She has no headache or visual changes.  She has been tolerating her treatment with Imfinzi fairly well.  The patient had repeat CT scan of the chest performed recently and she is here for evaluation and discussion of her scan results.  MEDICAL HISTORY: Past Medical History:  Diagnosis Date  . Cancer ([place])    Lung   . Carotid artery occlusion   . Clostridium difficile infection 06/2013  . Diabetes mellitus    takes Janumet daily  . Dyslipidemia    takes Crestor daily  . Gallstones    . GERD (gastroesophageal reflux disease)   . Heart murmur    hx of  . History of radiation therapy 06/11/17-06/21/17   right lung 50 Gy in 5 fractions  . Hypertension    takes Prinzide and Verapamil daily  . Impaired speech    from stroke  . Neuropathy, diabetic ([place])   . Pneumonia   . PONV (postoperative nausea and vomiting)   . Seizures ([place]) 04/19/2018   recent hospitalization  . Smoker   . Stroke ([place]) 06/25/13  . Vertigo    but doesn't take any meds    ALLERGIES:  is allergic to codeine; benadryl [diphenhydramine]; lipitor [atorvastatin]; and phenergan [promethazine hcl].  MEDICATIONS:  Current Outpatient Medications  Medication Sig Dispense Refill  . aspirin EC 81 MG tablet Take 81 mg by mouth daily.    . Blood Glucose Monitoring Suppl (ONE TOUCH ULTRA 2) w/Device KIT 1 Device by Does not apply route 2 (two) times daily. 1 each 0  . clopidogrel (PLAVIX) 75 MG tablet Take 1 tablet (75 mg total) by mouth daily. 90 tablet 3  . divalproex (DEPAKOTE ER) 500 MG 24 hr tablet Take one tablet daily 30 tablet 5  . glucose blood (ONE TOUCH ULTRA TEST) test strip Use as instructed 100 each 12  . Lancets (ONETOUCH ULTRASOFT) lancets Use as instructed 100 each 12  . lidocaine-prilocaine (EMLA) cream Apply 1 application topically as needed. 30 g 0  . lisinopril (PRINIVIL,ZESTRIL) 10 MG tablet Take 1 tablet (10 mg total) by mouth daily. 90 tablet 3  . Multiple Vitamins-Minerals (MULTIVITAMIN PO) Take 1 tablet by  mouth daily.    . pioglitazone (ACTOS) 30 MG tablet Take 1 tablet (30 mg total) by mouth daily. 90 tablet 1  . prochlorperazine (COMPAZINE) 10 MG tablet Take 1 tablet (10 mg total) by mouth every 6 (six) hours as needed for nausea or vomiting. (Patient not taking: Reported on 02/11/2019) 30 tablet 0  . rosuvastatin (CRESTOR) 20 MG tablet TAKE 1 TABLET BY MOUTH ONCE DAILY (Patient taking differently: Take 20 mg by mouth daily. ) 180 tablet 0  . SitaGLIPtin-MetFORMIN HCl (JANUMET XR)  50-1000 MG TB24 Take 1 tablet by mouth daily. 60 tablet 5  . vitamin B-12 (CYANOCOBALAMIN) 1000 MCG tablet Take 1,000 mcg by mouth daily.     No current facility-administered medications for this visit.    Facility-Administered Medications Ordered in Other Visits  Medication Dose Route Frequency Provider Last Rate Last Dose  . sodium chloride flush (NS) 0.9 % injection 10 mL  10 mL Intracatheter PRN Curt Bears, MD        SURGICAL HISTORY:  Past Surgical History:  Procedure Laterality Date  . cataract removed Right   . CHOLECYSTECTOMY    . COLONOSCOPY    . ENDARTERECTOMY Left 07/14/2013   Procedure: ENDARTERECTOMY CAROTID-LEFT;  Surgeon: Elam Dutch, MD;  Location: [facility];  Service: Vascular;  Laterality: Left;  . EYE SURGERY Left    cataract  . IR IMAGING GUIDED PORT INSERTION  09/15/2018  . IR PERC PLEURAL DRAIN W/INDWELL CATH W/IMG GUIDE  05/23/2017  . KNEE SURGERY Left 32yr ago  . LAPAROSCOPIC PARTIAL COLECTOMY N/A 09/02/2015   Procedure: LAPAROSCOPIC PARTIAL RIGHT COLECTOMY;  Surgeon: ALeighton Ruff MD;  Location: [facility];  Service: General;  Laterality: N/A;  . PATCH ANGIOPLASTY Left 07/14/2013   Procedure: LEFT CAROTID ARTERY PATCH ANGIOPLASTY;  Surgeon: CElam Dutch MD;  [facility]  Service: Vascular;  Laterality: Left;  .Marland KitchenVIDEO BRONCHOSCOPY N/A 06/04/2018   Procedure: VIDEO BRONCHOSCOPY;  Surgeon: HMelrose Nakayama MD;  [facility];  Service: Thoracic;  Laterality: N/A;  . wisdom      REVIEW OF SYSTEMS:  Constitutional: negative Eyes: negative Ears, nose, mouth, throat, and face: negative Respiratory: positive for dyspnea on exertion Cardiovascular: negative Gastrointestinal: negative Genitourinary:negative Integument/breast: negative Hematologic/lymphatic: negative Musculoskeletal:negative Neurological: negative Behavioral/Psych: negative Endocrine: negative Allergic/Immunologic: negative   PHYSICAL EXAMINATION: General appearance:  alert, cooperative and no distress Head: Normocephalic, without obvious abnormality, atraumatic Neck: no adenopathy, no JVD, supple, symmetrical, trachea midline and thyroid not enlarged, symmetric, no tenderness/mass/nodules Lymph nodes: Cervical, supraclavicular, and axillary nodes normal. Resp: clear to auscultation bilaterally Back: symmetric, no curvature. ROM normal. No CVA tenderness. Cardio: regular rate and rhythm, S1, S2 normal, no murmur, click, rub or gallop GI: soft, non-tender; bowel sounds normal; no masses,  no organomegaly Extremities: extremities normal, atraumatic, no cyanosis or edema Neurologic: Alert and oriented X 3, normal strength and tone. Normal symmetric reflexes. Normal coordination and gait  ECOG PERFORMANCE STATUS: 1 - Symptomatic but completely ambulatory  Blood pressure (!) 128/53, pulse 85, temperature 97.8 F (36.6 C), temperature source Temporal, resp. rate 17, height _0  (1.676 m), weight 140 lb 1 oz (63.5 kg), SpO2 99 %.  LABORATORY DATA: Lab Results  Component Value Date   WBC 10.8 (H) 02/11/2019   HGB 11.5 (L) 02/11/2019   HCT 35.1 (L) 02/11/2019   MCV 90.7 02/11/2019   PLT 341 02/11/2019      Chemistry      Component Value Date/Time   NA 140 02/11/2019  1326   NA 139 05/07/2018 1147   K 4.0 02/11/2019 1326   CL 102 02/11/2019 1326   CO2 26 02/11/2019 1326   BUN 15 02/11/2019 1326   BUN 15 05/07/2018 1147   CREATININE 1.07 (H) 02/11/2019 1326   CREATININE 0.87 03/08/2015 0001      Component Value Date/Time   CALCIUM 9.3 02/11/2019 1326   ALKPHOS 66 02/11/2019 1326   AST 11 (L) 02/11/2019 1326   ALT 10 02/11/2019 1326   BILITOT 0.5 02/11/2019 1326       RADIOGRAPHIC STUDIES: Ct Chest W Contrast  Result Date: 02/18/2019 CLINICAL DATA:  Stage III squamous cell carcinoma of right lung. Radiation therapy completed in 2018. Immunotherapy ongoing. No complaints. EXAM: CT CHEST, ABDOMEN, AND PELVIS WITH CONTRAST TECHNIQUE:  Multidetector CT imaging of the chest, abdomen and pelvis was performed following the standard protocol during bolus administration of intravenous contrast. CONTRAST:  124m OMNIPAQUE IOHEXOL 300 MG/ML  SOLN COMPARISON:  Chest CT 12/01/2018. PET 05/07/2018. Most recent abdominal CT of 02/17/2017. FINDINGS: CT CHEST FINDINGS Cardiovascular: Right Port-A-Cath tip at low SVC. Aortic and branch vessel atherosclerosis. Tortuous thoracic aorta. Normal heart size, without pericardial effusion. Multivessel coronary artery atherosclerosis. No central pulmonary embolism, on this non-dedicated study. Mediastinum/Nodes: No supraclavicular adenopathy. No mediastinal or hilar adenopathy. Lungs/Pleura: Small volume right-sided pleural fluid with mild loculation, minimally increased. No right pleural effusion. Mild motion degradation in the upper and mid chest. Central right upper lobe lung mass measures 4.6 x 3.7 cm on image 50/6. Compare 5.0 x 3.9 cm on the prior exam (when remeasured). Mild centrilobular emphysema. A minimally cavitary superior segment left lower lobe pulmonary nodule measures 1.3 cm on image 58/6 and has enlarged from 7 mm on the prior exam. Musculoskeletal: No acute osseous abnormality. CT ABDOMEN PELVIS FINDINGS Hepatobiliary: Mild motion degradation continuing into the upper abdomen. Normal liver. Cholecystectomy, without biliary ductal dilatation. Pancreas: Normal, without mass or ductal dilatation. Spleen: Capsular based splenic lesion measures 1.0 cm and is similar to on the prior, favoring a cyst or lymphangioma. Adrenals/Urinary Tract: Left greater than right adrenal thickening, without dominant mass. Bilateral too small to characterize renal lesions. Upper pole left renal cysts. 1.0 cm upper pole enhancing left renal mass on coronal image 95 is similar to on the prior chest CT. Decompressed urinary bladder. Stomach/Bowel: Normal stomach, without wall thickening. Scattered colonic diverticula. Right  hemicolectomy. Normal small bowel. Vascular/Lymphatic: Advanced aortic and branch vessel atherosclerosis. Patent renal veins. No abdominopelvic adenopathy. Reproductive: Uterus is positioned eccentric left.  No adnexal mass. Other: No significant free fluid. Mild pelvic floor laxity. No evidence of omental or peritoneal disease. Fat containing paramidline ventral abdominal wall hernia. Musculoskeletal: Lumbosacral spondylosis. IMPRESSION: 1. The right upper lobe lung mass is similar to minimally decreased in size. 2. Enlargement of a superior segment left lower lobe pulmonary nodule, consistent with metastatic disease versus metachronous primary. 3. Slight increase in small, partially loculated right-sided pleural effusion. 4. No thoracic adenopathy. 5. Aortic atherosclerosis (ICD10-I70.0), coronary artery atherosclerosis and emphysema (ICD10-J43.9). 6. No evidence of metastatic disease in the abdomen or pelvis. 7. Similar size of an upper pole left renal lesion, again suspicious for renal cell carcinoma. Electronically Signed   By: KAbigail MiyamotoM.D.   On: 02/18/2019 09:26   Mr BJeri CosWOJContrast  Result Date: 02/20/2019  [facility] [address] [place] [phone] REPORT STUDY DATE: 02/19/2019 PATIENT NAME: Nichole VIRGENDOB: 5Apr 02, 1942MRN:  [id number] EXAM: MRI Brain with and without contrast ORDERING CLINICIAN: Antony Contras, MD CLINICAL HISTORY: 78 year old woman with an abnormal brain MRI COMPARISON FILMS: 06/01/2018 TECHNIQUE:MRI of the brain with and without contrast was obtained utilizing 5 mm axial slices with T1, T2, T2 flair, SWI and diffusion weighted views.  T1 sagittal, T2 coronal and postcontrast views in the axial and coronal plane were obtained. CONTRAST: 12 ml [facility] IMAGING SITE: [facility], [place]. FINDINGS: On sagittal images, the spinal cord is imaged caudally to C3-C4 and is normal in caliber.   The  contents of the posterior fossa are of normal size and position.   The pituitary gland and optic chiasm appear normal.    There is mild generalized cortical atrophy.   There are no abnormal extra-axial collections of fluid.  There is a left frontal lobe focus that is 23 mm in maximum AP diameter that is hyperintense FLAIR and T2-weighted images and hypointense on T1-weighted images.  It does not enhance.  It appears unchanged compared to the 2019 MRI.  Elsewhere in the hemispheres, there are stable T2/flair hyperintense foci consistent with chronic microvascular ischemic change.  The cerebellum and brainstem appears normal.   The deep gray matter appears normal.    Diffusion weighted images are normal.  Susceptibility weighted images are normal.   The VIIth/VIIIth nerve complex appears normal.  There have been bilateral lens replacements.  The orbits are otherwise normal.  The mastoid air cells appear normal.  The paranasal sinuses appear normal.  Flow voids are identified within the major intracerebral arteries.  After the infusion of contrast material, a normal enhancement pattern is noted.   This MRI of the brain with and without contrast shows the following: 1.  Left frontal focus measuring 23 millimeters in maximal AP diameter.  It is stable compared to the previous MRI.  This most likely represents a chronic stroke.  Low-grade glioma is less likely though cannot be ruled out as there had been some change over the last 6 years. 2.   Mild chronic microvascular ischemic change. 3.   Mild generalized cortical atrophy. 4.   There is a normal enhancement pattern and no acute findings. INTERPRETING PHYSICIAN: Richard A. Felecia Shelling, MD, PhD, FAAN Certified in  Neuroimaging by [employer]   Ct Abdomen Pelvis W Contrast  Result Date: 02/18/2019 CLINICAL DATA:  Stage III squamous cell carcinoma of right lung. Radiation therapy completed in 2018. Immunotherapy ongoing. No complaints. EXAM: CT CHEST,  ABDOMEN, AND PELVIS WITH CONTRAST TECHNIQUE: Multidetector CT imaging of the chest, abdomen and pelvis was performed following the standard protocol during bolus administration of intravenous contrast. CONTRAST:  157m OMNIPAQUE IOHEXOL 300 MG/ML  SOLN COMPARISON:  Chest CT 12/01/2018. PET 05/07/2018. Most recent abdominal CT of 02/17/2017. FINDINGS: CT CHEST FINDINGS Cardiovascular: Right Port-A-Cath tip at low SVC. Aortic and branch vessel atherosclerosis. Tortuous thoracic aorta. Normal heart size, without pericardial effusion. Multivessel coronary artery atherosclerosis. No central pulmonary embolism, on this non-dedicated study. Mediastinum/Nodes: No supraclavicular adenopathy. No mediastinal or hilar adenopathy. Lungs/Pleura: Small volume right-sided pleural fluid with mild loculation, minimally increased. No right pleural effusion. Mild motion degradation in the upper and mid chest. Central right upper lobe lung mass measures 4.6 x 3.7 cm on image 50/6. Compare 5.0 x 3.9 cm on the prior exam (when remeasured). Mild centrilobular emphysema. A minimally cavitary superior segment left lower lobe pulmonary nodule measures 1.3 cm on image 58/6 and has enlarged from 7 mm on the  prior exam. Musculoskeletal: No acute osseous abnormality. CT ABDOMEN PELVIS FINDINGS Hepatobiliary: Mild motion degradation continuing into the upper abdomen. Normal liver. Cholecystectomy, without biliary ductal dilatation. Pancreas: Normal, without mass or ductal dilatation. Spleen: Capsular based splenic lesion measures 1.0 cm and is similar to on the prior, favoring a cyst or lymphangioma. Adrenals/Urinary Tract: Left greater than right adrenal thickening, without dominant mass. Bilateral too small to characterize renal lesions. Upper pole left renal cysts. 1.0 cm upper pole enhancing left renal mass on coronal image 95 is similar to on the prior chest CT. Decompressed urinary bladder. Stomach/Bowel: Normal stomach, without wall  thickening. Scattered colonic diverticula. Right hemicolectomy. Normal small bowel. Vascular/Lymphatic: Advanced aortic and branch vessel atherosclerosis. Patent renal veins. No abdominopelvic adenopathy. Reproductive: Uterus is positioned eccentric left.  No adnexal mass. Other: No significant free fluid. Mild pelvic floor laxity. No evidence of omental or peritoneal disease. Fat containing paramidline ventral abdominal wall hernia. Musculoskeletal: Lumbosacral spondylosis. IMPRESSION: 1. The right upper lobe lung mass is similar to minimally decreased in size. 2. Enlargement of a superior segment left lower lobe pulmonary nodule, consistent with metastatic disease versus metachronous primary. 3. Slight increase in small, partially loculated right-sided pleural effusion. 4. No thoracic adenopathy. 5. Aortic atherosclerosis (ICD10-I70.0), coronary artery atherosclerosis and emphysema (ICD10-J43.9). 6. No evidence of metastatic disease in the abdomen or pelvis. 7. Similar size of an upper pole left renal lesion, again suspicious for renal cell carcinoma. Electronically Signed   By: Abigail Miyamoto M.D.   On: 02/18/2019 09:26    ASSESSMENT AND PLAN: This is a very pleasant 78 years old white female with recurrent non-small cell lung cancer presented as a stage IIIa involving the right lower lobe. The patient underwent a course of concurrent chemoradiation with weekly carboplatin and paclitaxel status post 7 cycles.  She tolerated this treatment well. She had repeat CT scan of the chest performed recently.  I personally and independently reviewed the scan images and discussed the results with the patient and her daughter. Her scan showed improvement of the right lung mass with no other concerning findings for progression. The patient was started on treatment with consolidation immunotherapy with Imfinzi status post 12 cycles.   She has been tolerating this treatment well with no concerning adverse effects. She  had repeat CT scan of the chest performed recently.  I personally and independently reviewed the scan images and discussed the results with the patient and later with her daughter.  Her scan showed similar to minimally decreased size of the right upper lobe lung mass but there was enlargement of a superior segment left lower lobe pulmonary nodule consistent with metastatic disease versus metachronous primary. I recommended for the patient to see Dr. Sondra Come for consideration of SBRT to the enlarging left lower lobe lung nodule. She will continue her current treatment with Imfinzi and she will proceed with cycle #13 today. For hypertension, she will continue to take her blood pressure medication as prescribed. I will see her back for follow-up visit in 2 weeks for evaluation before the next cycle of her treatment. She was advised to call immediately if she has any concerning symptoms in the interval. The patient voices understanding of current disease status and treatment options and is in agreement with the current care plan. All questions were answered. The patient knows to call the clinic with any problems, questions or concerns. We can certainly see the patient much sooner if necessary.  Disclaimer: This note was dictated with voice recognition  software. Similar sounding words can inadvertently be transcribed and may not be corrected upon review.

## 2019-02-24 NOTE — Patient Outreach (Signed)
[facility]) Care Management  02/24/2019  [name] May 28, 1941 [id number]   Medication Adherence call to Mrs. [name] spoke with patient daughter she explain patient is still taking Rosuvastatin 20 mg and has one more tablet she ask if we can call [facility] an order this medication [name] will have it ready for patient to pick up. [name] is showing past due under [place].   [facility] Direct Dial [phone]  Fax [phone] [email or web address]

## 2019-02-25 NOTE — Progress Notes (Signed)
Thoracic Location of Tumor / Histology: DIAGNOSIS:Recurrent non-small cell lung cancer likely squamous cell carcinoma that was initially diagnosed as a stage IIA(T2b, N0, M0) inAugust 2018status post curative stereotactic radiotherapy completed June 21, 2017. The patient presented today with concerning findings for disease recurrence and progression with enlarging and hypermetabolic right upper lobe lung mass presenting as stage IIIa (T3, N0, M0).   Tobacco/Marijuana/Snuff/ETOH use:  Tobacco Use  . Smoking status: Current Every Day Smoker    Packs/day: 0.50    Years: 60.00    Pack years: 30.00    Types: Cigarettes  . Smokeless tobacco: Never Used  Substance Use Topics  . Alcohol use: No    Alcohol/week: 0.0 standard drinks  . Drug use: No     Past/Anticipated interventions by cardiothoracic surgery, if any: None at this time.  Past/Anticipated interventions by medical oncology, if any: Per Dr. Julien Nordmann 02/24/19:  ASSESSMENT AND PLAN: This is a very pleasant 78 years old white female with recurrent non-small cell lung cancer presented as a stage IIIa involving the right lower lobe. The patient underwent a course of concurrent chemoradiation with weekly carboplatin and paclitaxel status post 7 cycles.  She tolerated this treatment well. She had repeat CT scan of the chest performed recently.  I personally and independently reviewed the scan images and discussed the results with the patient and her daughter. Her scan showed improvement of the right lung mass with no other concerning findings for progression. The patient was started on treatment with consolidation immunotherapy with Imfinzi status post 12 cycles.   She has been tolerating this treatment well with no concerning adverse effects. She had repeat CT scan of the chest performed recently.  I personally and independently reviewed the scan images and discussed the results with the patient and later with her daughter.   Her scan showed similar to minimally decreased size of the right upper lobe lung mass but there was enlargement of a superior segment left lower lobe pulmonary nodule consistent with metastatic disease versus metachronous primary. I recommended for the patient to see Dr. Sondra Come for consideration of SBRT to the enlarging left lower lobe lung nodule. She will continue her current treatment with Imfinzi and she will proceed with cycle #13 today. For hypertension, she will continue to take her blood pressure medication as prescribed. I will see her back for follow-up visit in 2 weeks for evaluation before the next cycle of her treatment.  Signs/Symptoms Weight changes, if any:  Wt Readings from Last 3 Encounters:  02/26/19 140 lb (63.5 kg)  02/24/19 140 lb 1 oz (63.5 kg)  02/23/19 141 lb 3.2 oz (64 kg)       Respiratory complaints, if any: She denied having any chest pain, shortness of breath, cough or hemoptysis.  She denied having any recent weight loss or night sweats.  She has no nausea, vomiting, diarrhea or constipation.  She has no headache or visual changes.   Hemoptysis, if any: Pt denies  Pain issues, if any:  Pt denies c/o pain.  SAFETY ISSUES: Prior radiation? Radiation treatment dates:06/11/17-06/21/17  Site/dose:Right lung / 50 Gy delivered in 5 fractions   Beams/energy:SBRT/SRT-3D / 6X-FFF  Radiation treatment dates:   06/24/18-08/06/17  Site/dose:   Right lung; 60 Gy in 15 fractions of 2 Gy along with radiosensitizing chemotherapy  Beams/energy:   Photon 3D; 6X, 10X  Pacemaker/ICD? No  Possible current pregnancy? No  Is the patient on methotrexate? No  Current Complaints / other details:  Pt presents  today for reconsult with Dr. Sondra Come for Radiation Oncology. Pt is accompanied by daughter due to cognition/treatment planning.   BP 126/66 (BP Location: Left Arm, Patient Position: Sitting)   Pulse 89   Temp 99.1 F (37.3 C) (Temporal)   Resp 18    Ht 5\' 6"  (1.676 m)   Wt 140 lb (63.5 kg)   SpO2 98%   BMI 22.60 kg/m   Loma Sousa, RN BSN

## 2019-02-26 ENCOUNTER — Encounter: Payer: Self-pay | Admitting: Radiation Oncology

## 2019-02-26 ENCOUNTER — Ambulatory Visit
Admission: RE | Admit: 2019-02-26 | Discharge: 2019-02-26 | Disposition: A | Discharge status: Home / Self Care | Payer: Medicare Other | Source: Ambulatory Visit | Attending: Radiation Oncology | Admitting: Radiation Oncology

## 2019-02-26 ENCOUNTER — Other Ambulatory Visit: Payer: Self-pay

## 2019-02-26 VITALS — BP 126/66 | HR 89 | Temp 99.1°F | Resp 18 | Ht 66.0 in | Wt 140.0 lb

## 2019-02-26 DIAGNOSIS — F17210 Nicotine dependence, cigarettes, uncomplicated: Secondary | ICD-10-CM | POA: Insufficient documentation

## 2019-02-26 DIAGNOSIS — Z9221 Personal history of antineoplastic chemotherapy: Secondary | ICD-10-CM | POA: Diagnosis not present

## 2019-02-26 DIAGNOSIS — Z7982 Long term (current) use of aspirin: Secondary | ICD-10-CM | POA: Insufficient documentation

## 2019-02-26 DIAGNOSIS — E785 Hyperlipidemia, unspecified: Secondary | ICD-10-CM | POA: Diagnosis not present

## 2019-02-26 DIAGNOSIS — I10 Essential (primary) hypertension: Secondary | ICD-10-CM | POA: Insufficient documentation

## 2019-02-26 DIAGNOSIS — I700 Atherosclerosis of aorta: Secondary | ICD-10-CM | POA: Insufficient documentation

## 2019-02-26 DIAGNOSIS — R42 Dizziness and giddiness: Secondary | ICD-10-CM | POA: Insufficient documentation

## 2019-02-26 DIAGNOSIS — Z8673 Personal history of transient ischemic attack (TIA), and cerebral infarction without residual deficits: Secondary | ICD-10-CM | POA: Diagnosis not present

## 2019-02-26 DIAGNOSIS — C3411 Malignant neoplasm of upper lobe, right bronchus or lung: Secondary | ICD-10-CM | POA: Diagnosis not present

## 2019-02-26 DIAGNOSIS — Z923 Personal history of irradiation: Secondary | ICD-10-CM | POA: Diagnosis not present

## 2019-02-26 DIAGNOSIS — R918 Other nonspecific abnormal finding of lung field: Secondary | ICD-10-CM | POA: Diagnosis not present

## 2019-02-26 DIAGNOSIS — E1140 Type 2 diabetes mellitus with diabetic neuropathy, unspecified: Secondary | ICD-10-CM | POA: Insufficient documentation

## 2019-02-26 DIAGNOSIS — J90 Pleural effusion, not elsewhere classified: Secondary | ICD-10-CM | POA: Diagnosis not present

## 2019-02-26 DIAGNOSIS — C3491 Malignant neoplasm of unspecified part of right bronchus or lung: Secondary | ICD-10-CM

## 2019-02-26 DIAGNOSIS — E1136 Type 2 diabetes mellitus with diabetic cataract: Secondary | ICD-10-CM | POA: Diagnosis not present

## 2019-02-26 DIAGNOSIS — Z808 Family history of malignant neoplasm of other organs or systems: Secondary | ICD-10-CM | POA: Insufficient documentation

## 2019-02-26 DIAGNOSIS — C3432 Malignant neoplasm of lower lobe, left bronchus or lung: Secondary | ICD-10-CM | POA: Diagnosis not present

## 2019-02-26 DIAGNOSIS — R011 Cardiac murmur, unspecified: Secondary | ICD-10-CM | POA: Diagnosis not present

## 2019-02-26 DIAGNOSIS — K219 Gastro-esophageal reflux disease without esophagitis: Secondary | ICD-10-CM | POA: Diagnosis not present

## 2019-02-26 DIAGNOSIS — Z79899 Other long term (current) drug therapy: Secondary | ICD-10-CM | POA: Insufficient documentation

## 2019-02-26 DIAGNOSIS — Z85118 Personal history of other malignant neoplasm of bronchus and lung: Secondary | ICD-10-CM | POA: Diagnosis not present

## 2019-02-26 DIAGNOSIS — K769 Liver disease, unspecified: Secondary | ICD-10-CM | POA: Insufficient documentation

## 2019-02-26 NOTE — Patient Instructions (Signed)
Coronavirus (COVID-19) Are you at risk?  Are you at risk for the Coronavirus (COVID-19)?  To be considered HIGH RISK for Coronavirus (COVID-19), you have to meet the following criteria:  . Traveled to [place], [place], [place], [place] or [place]; or in the [place] to [place], [place], [place], or [place]; and have fever, cough, and shortness of breath within the last 2 weeks of travel OR . Been in close contact with a person diagnosed with COVID-19 within the last 2 weeks and have fever, cough, and shortness of breath . IF YOU DO NOT MEET THESE CRITERIA, YOU ARE CONSIDERED LOW RISK FOR COVID-19.  What to do if you are HIGH RISK for COVID-19?  . If you are having a medical emergency, call 911. . Seek medical care right away. Before you go to a doctor's office, urgent care or emergency department, call ahead and tell them about your recent travel, contact with someone diagnosed with COVID-19, and your symptoms. You should receive instructions from your physician's office regarding next steps of care.  . When you arrive at healthcare provider, tell the healthcare staff immediately you have returned from visiting [place], [place], [place], [place] or [place]; or traveled in the [place] to [place], [place], [place], or [place]; in the last two weeks or you have been in close contact with a person diagnosed with COVID-19 in the last 2 weeks.   . Tell the health care staff about your symptoms: fever, cough and shortness of breath. . After you have been seen by a medical provider, you will be either: o Tested for (COVID-19) and discharged home on quarantine except to seek medical care if symptoms worsen, and asked to  - Stay home and avoid contact with others until you get your results (4-5 days)  - Avoid travel on public transportation if possible (such as bus, train, or airplane) or o Sent to the Emergency Department by EMS for evaluation, COVID-19 testing, and possible  admission depending on your condition and test results.  What to do if you are LOW RISK for COVID-19?  Reduce your risk of any infection by using the same precautions used for avoiding the common cold or flu:  . Wash your hands often with soap and warm water for at least 20 seconds.  If soap and water are not readily available, use an alcohol-based hand sanitizer with at least 60% alcohol.  . If coughing or sneezing, cover your mouth and nose by coughing or sneezing into the elbow areas of your shirt or coat, into a tissue or into your sleeve (not your hands). . Avoid shaking hands with others and consider head nods or verbal greetings only. . Avoid touching your eyes, nose, or mouth with unwashed hands.  . Avoid close contact with people who are sick. . Avoid places or events with large numbers of people in one location, like concerts or sporting events. . Carefully consider travel plans you have or are making. . If you are planning any travel outside or inside the [place], visit the CDC's Travelers' Health webpage for the latest health notices. . If you have some symptoms but not all symptoms, continue to monitor at home and seek medical attention if your symptoms worsen. . If you are having a medical emergency, call 911.   ADDITIONAL HEALTHCARE OPTIONS FOR PATIENTS  [facility] / e-Visit: [email or web address]         [facility]: [phone]  [facility]: [phone]                   [facility]: [phone]   

## 2019-02-26 NOTE — Progress Notes (Signed)
Radiation Oncology         [phone] ________________________________  Outpatient Re-Consultation  Name: [name] MRN: [id number]  Date: 02/26/2019  DOB: 1940/08/08  LD:[id number], Nichole Jarvis, MD  Curt Bears, MD   REFERRING PHYSICIAN: Curt Bears, MD  DIAGNOSIS: Recurrent non-small cell lung cancer, now with enlarging pulmonary nodule in the superior segment of the left lower lobe, consistent with metastatic disease versus metachronous primary  PRIOR THERAPY: 1) Curative stereotactic radiotherapy completed on June 21, 2017. 2) A course of concurrent chemoradiation with chemotherapy consisting of carboplatin for an AUC of 2 and paclitaxel 45 mg/m.First dose started on 06/23/2018.Status post 7 cycles.  Last dose of chemotherapy was given August 04, 2018.  CURRENT THERAPY:Consolidation immunotherapy with Imfinzi 10 mg/KG every 2 weeks.  First dose September 09, 2018.  Status post 12 cycles.  HISTORY OF PRESENT ILLNESS::[name] is a 78 y.o. female who is here today at the request of Dr. Julien Nordmann for evaluation of lung cancer. The patient is accompanied by her daughter today due to cognitive issues.  Her most recent CT C/A/P scans from 02/18/2019 showed: The right upper lobe lung mass is similar to minimally decreased in size. However, there is enlargement of a superior segment left lower lobe pulmonary nodule, from 7 mm on prior exam to now 1.3 cm, consistent with metastatic disease versus metachronous primary. There is also a slight increase in a small, partially loculated right-sided pleural effusion. No thoracic adenopathy. No evidence of metastatic disease in the abdomen or pelvis. Similar size of an upper pole left renal lesion noted, again suspicious for renal cell carcinoma.   Other pertinent imaging includes a brain MRI from 02/19/2019 which showed: Left frontal focus measuring 23 millimeters in maximal AP diameter. It is stable compared to the previous MRI.  This most likely represents a chronic stroke. Low-grade glioma is less likely though cannot be ruled out as there had been some change over the last 6 years. Mild chronic microvascular ischemic change. Mild generalized cortical atrophy. There is a normal enhancement pattern and no acute findings.  The patient reviewed the imaging results with Dr. Julien Nordmann and has kindly been referred today for consideration of SBRT to the enlarging left lower lobe lung nodule. She will also continue her current treatment with Imfinzi.  PREVIOUS RADIATION THERAPY: Yes   Radiation treatment dates:   06/24/18-08/06/17 Site/dose:   Right lung / 60 Gy in 30 fractions of 2 Gy along with radiosensitizing chemotherapy  SBRT dates:   06/11/17-06/21/17 Site/dose:   Right lung / 50 Gy delivered in 5 fractions  PAST MEDICAL HISTORY:  has a past medical history of Cancer [facility]), Carotid artery occlusion, Clostridium difficile infection (06/2013), Diabetes mellitus, Dyslipidemia, Gallstones, GERD (gastroesophageal reflux disease), Heart murmur, History of radiation therapy (06/11/17-06/21/17), Hypertension, Impaired speech, Neuropathy, diabetic ([place]), Pneumonia, PONV (postoperative nausea and vomiting), Seizures ([place]) (04/19/2018), Smoker, Stroke ([place]) (06/25/13), and Vertigo.    PAST SURGICAL HISTORY: Past Surgical History:  Procedure Laterality Date   cataract removed Right    CHOLECYSTECTOMY     COLONOSCOPY     ENDARTERECTOMY Left 07/14/2013   Procedure: ENDARTERECTOMY CAROTID-LEFT;  Surgeon: Elam Dutch, MD;  Location: [facility];  Service: Vascular;  Laterality: Left;   EYE SURGERY Left    cataract   IR IMAGING GUIDED PORT INSERTION  09/15/2018   IR PERC PLEURAL DRAIN W/INDWELL CATH W/IMG GUIDE  05/23/2017   KNEE SURGERY Left 42yr ago   LAPAROSCOPIC PARTIAL COLECTOMY N/A 09/02/2015  Procedure: LAPAROSCOPIC PARTIAL RIGHT COLECTOMY;  Surgeon: Leighton Ruff, MD;  Location: [facility];  Service: General;  Laterality:  N/A;   PATCH ANGIOPLASTY Left 07/14/2013   Procedure: LEFT CAROTID ARTERY PATCH ANGIOPLASTY;  Surgeon: Elam Dutch, MD;  Location: [place];  Service: Vascular;  Laterality: Left;   VIDEO BRONCHOSCOPY N/A 06/04/2018   Procedure: VIDEO BRONCHOSCOPY;  Surgeon: Melrose Nakayama, MD;  Location[facility];  Service: Thoracic;  Laterality: N/A;   wisdom      FAMILY HISTORY: family history includes Cancer in her father; Diabetes in her brother, maternal aunt, maternal uncle, mother, and sister; Heart attack in her brother; Heart disease in her father; Leukemia in her mother.  SOCIAL HISTORY:  reports that she has been smoking cigarettes. She has a 30.00 pack-year smoking history. She has never used smokeless tobacco. She reports that she does not drink alcohol or use drugs.  ALLERGIES: Codeine, Benadryl [diphenhydramine], Lipitor [atorvastatin], and Phenergan [promethazine hcl]  MEDICATIONS:  Current Outpatient Medications  Medication Sig Dispense Refill   aspirin EC 81 MG tablet Take 81 mg by mouth daily.     Blood Glucose Monitoring Suppl (ONE TOUCH ULTRA 2) w/Device KIT 1 Device by Does not apply route 2 (two) times daily. 1 each 0   clopidogrel (PLAVIX) 75 MG tablet Take 1 tablet (75 mg total) by mouth daily. 90 tablet 3   divalproex (DEPAKOTE ER) 500 MG 24 hr tablet Take one tablet daily 30 tablet 5   glucose blood (ONE TOUCH ULTRA TEST) test strip Use as instructed 100 each 12   Lancets (ONETOUCH ULTRASOFT) lancets Use as instructed 100 each 12   lidocaine-prilocaine (EMLA) cream Apply 1 application topically as needed. 30 g 0   lisinopril (PRINIVIL,ZESTRIL) 10 MG tablet Take 1 tablet (10 mg total) by mouth daily. 90 tablet 3   Multiple Vitamins-Minerals (MULTIVITAMIN PO) Take 1 tablet by mouth daily.     pioglitazone (ACTOS) 30 MG tablet Take 1 tablet (30 mg total) by mouth daily. 90 tablet 1   prochlorperazine (COMPAZINE) 10 MG tablet Take 1 tablet (10 mg total) by mouth  every 6 (six) hours as needed for nausea or vomiting. 30 tablet 0   rosuvastatin (CRESTOR) 20 MG tablet TAKE 1 TABLET BY MOUTH ONCE DAILY (Patient taking differently: Take 20 mg by mouth daily. ) 180 tablet 0   SitaGLIPtin-MetFORMIN HCl (JANUMET XR) 50-1000 MG TB24 Take 1 tablet by mouth daily. 60 tablet 5   vitamin B-12 (CYANOCOBALAMIN) 1000 MCG tablet Take 1,000 mcg by mouth daily.     No current facility-administered medications for this encounter.     REVIEW OF SYSTEMS:  REVIEW OF SYSTEMS: A 10+ POINT REVIEW OF SYSTEMS WAS OBTAINED including neurology, dermatology, psychiatry, cardiac, respiratory, lymph, extremities, GI, GU, musculoskeletal, constitutional, reproductive, HEENT. All pertinent positives are noted in the HPI. All others are negative.   PHYSICAL EXAM:  height is '5\' 6"'$  (1.676 m) and weight is 140 lb (63.5 kg). Her temporal temperature is 99.1 F (37.3 C). Her blood pressure is 126/66 and her pulse is 89. Her respiration is 18 and oxygen saturation is 98%.   Lungs are clear to auscultation bilaterally. Heart has regular rate and rhythm. No palpable cervical, supraclavicular, or axillary adenopathy. Abdomen soft, non-tender, normal bowel sounds.   ECOG = 1  0 - Asymptomatic (Fully active, able to carry on all predisease activities without restriction)  1 - Symptomatic but completely ambulatory (Restricted in physically strenuous activity but ambulatory and able  to carry out work of a light or sedentary nature. For example, light housework, office work)  2 - Symptomatic, <50% in bed during the day (Ambulatory and capable of all self care but unable to carry out any work activities. Up and about more than 50% of waking hours)  3 - Symptomatic, >50% in bed, but not bedbound (Capable of only limited self-care, confined to bed or chair 50% or more of waking hours)  4 - Bedbound (Completely disabled. Cannot carry on any self-care. Totally confined to bed or chair)  5 -  Death   Eustace Pen MM, Creech RH, Tormey DC, et al. [phone]). "Toxicity and response criteria of the [facility]". [place] Oncol. 5 (6): 649-55  LABORATORY DATA:  Lab Results  Component Value Date   WBC 10.3 02/24/2019   HGB 12.2 02/24/2019   HCT 37.2 02/24/2019   MCV 91.6 02/24/2019   PLT 392 02/24/2019   NEUTROABS 7.2 02/24/2019   Lab Results  Component Value Date   NA 139 02/24/2019   K 3.9 02/24/2019   CL 102 02/24/2019   CO2 25 02/24/2019   GLUCOSE 217 (H) 02/24/2019   CREATININE 0.97 02/24/2019   CALCIUM 9.6 02/24/2019      RADIOGRAPHY: Ct Chest W Contrast  Result Date: 02/18/2019 CLINICAL DATA:  Stage III squamous cell carcinoma of right lung. Radiation therapy completed in 2018. Immunotherapy ongoing. No complaints. EXAM: CT CHEST, ABDOMEN, AND PELVIS WITH CONTRAST TECHNIQUE: Multidetector CT imaging of the chest, abdomen and pelvis was performed following the standard protocol during bolus administration of intravenous contrast. CONTRAST:  11m OMNIPAQUE IOHEXOL 300 MG/ML  SOLN COMPARISON:  Chest CT 12/01/2018. PET 05/07/2018. Most recent abdominal CT of 02/17/2017. FINDINGS: CT CHEST FINDINGS Cardiovascular: Right Port-A-Cath tip at low SVC. Aortic and branch vessel atherosclerosis. Tortuous thoracic aorta. Normal heart size, without pericardial effusion. Multivessel coronary artery atherosclerosis. No central pulmonary embolism, on this non-dedicated study. Mediastinum/Nodes: No supraclavicular adenopathy. No mediastinal or hilar adenopathy. Lungs/Pleura: Small volume right-sided pleural fluid with mild loculation, minimally increased. No right pleural effusion. Mild motion degradation in the upper and mid chest. Central right upper lobe lung mass measures 4.6 x 3.7 cm on image 50/6. Compare 5.0 x 3.9 cm on the prior exam (when remeasured). Mild centrilobular emphysema. A minimally cavitary superior segment left lower lobe pulmonary nodule measures 1.3 cm on  image 58/6 and has enlarged from 7 mm on the prior exam. Musculoskeletal: No acute osseous abnormality. CT ABDOMEN PELVIS FINDINGS Hepatobiliary: Mild motion degradation continuing into the upper abdomen. Normal liver. Cholecystectomy, without biliary ductal dilatation. Pancreas: Normal, without mass or ductal dilatation. Spleen: Capsular based splenic lesion measures 1.0 cm and is similar to on the prior, favoring a cyst or lymphangioma. Adrenals/Urinary Tract: Left greater than right adrenal thickening, without dominant mass. Bilateral too small to characterize renal lesions. Upper pole left renal cysts. 1.0 cm upper pole enhancing left renal mass on coronal image 95 is similar to on the prior chest CT. Decompressed urinary bladder. Stomach/Bowel: Normal stomach, without wall thickening. Scattered colonic diverticula. Right hemicolectomy. Normal small bowel. Vascular/Lymphatic: Advanced aortic and branch vessel atherosclerosis. Patent renal veins. No abdominopelvic adenopathy. Reproductive: Uterus is positioned eccentric left.  No adnexal mass. Other: No significant free fluid. Mild pelvic floor laxity. No evidence of omental or peritoneal disease. Fat containing paramidline ventral abdominal wall hernia. Musculoskeletal: Lumbosacral spondylosis. IMPRESSION: 1. The right upper lobe lung mass is similar to minimally decreased in size. 2. Enlargement of  a superior segment left lower lobe pulmonary nodule, consistent with metastatic disease versus metachronous primary. 3. Slight increase in small, partially loculated right-sided pleural effusion. 4. No thoracic adenopathy. 5. Aortic atherosclerosis (ICD10-I70.0), coronary artery atherosclerosis and emphysema (ICD10-J43.9). 6. No evidence of metastatic disease in the abdomen or pelvis. 7. Similar size of an upper pole left renal lesion, again suspicious for renal cell carcinoma. Electronically Signed   By: Abigail Miyamoto M.D.   On: 02/18/2019 09:26   Mr Jeri Cos VV  Contrast  Result Date: 02/20/2019  [facility] [address] [place], [place] [address] [phone] NEUROIMAGING REPORT STUDY DATE: 02/19/2019 PATIENT NAME: [name] DOB: July 04, 1941 MRN: [id number] EXAM: MRI Brain with and without contrast ORDERING CLINICIAN: Antony Contras, MD CLINICAL HISTORY: 78 year old woman with an abnormal brain MRI COMPARISON FILMS: 06/01/2018 TECHNIQUE:MRI of the brain with and without contrast was obtained utilizing 5 mm axial slices with T1, T2, T2 flair, SWI and diffusion weighted views.  T1 sagittal, T2 coronal and postcontrast views in the axial and coronal plane were obtained. CONTRAST: 12 ml [facility] IMAGING SITE: [facility], [place]. FINDINGS: On sagittal images, the spinal cord is imaged caudally to C3-C4 and is normal in caliber.   The contents of the posterior fossa are of normal size and position.   The pituitary gland and optic chiasm appear normal.    There is mild generalized cortical atrophy.   There are no abnormal extra-axial collections of fluid.  There is a left frontal lobe focus that is 23 mm in maximum AP diameter that is hyperintense FLAIR and T2-weighted images and hypointense on T1-weighted images.  It does not enhance.  It appears unchanged compared to the 2019 MRI.  Elsewhere in the hemispheres, there are stable T2/flair hyperintense foci consistent with chronic microvascular ischemic change.  The cerebellum and brainstem appears normal.   The deep gray matter appears normal.    Diffusion weighted images are normal.  Susceptibility weighted images are normal.   The VIIth/VIIIth nerve complex appears normal.  There have been bilateral lens replacements.  The orbits are otherwise normal.  The mastoid air cells appear normal.  The paranasal sinuses appear normal.  Flow voids are identified within the major intracerebral arteries.  After the infusion of contrast material, a normal enhancement pattern is noted.    This MRI of the brain with and without contrast shows the following: 1.  Left frontal focus measuring 23 millimeters in maximal AP diameter.  It is stable compared to the previous MRI.  This most likely represents a chronic stroke.  Low-grade glioma is less likely though cannot be ruled out as there had been some change over the last 6 years. 2.   Mild chronic microvascular ischemic change. 3.   Mild generalized cortical atrophy. 4.   There is a normal enhancement pattern and no acute findings. INTERPRETING PHYSICIAN: Richard A. Felecia Shelling, MD, PhD, FAAN Certified in  Neuroimaging by [employer]   Ct Abdomen Pelvis W Contrast  Result Date: 02/18/2019 CLINICAL DATA:  Stage III squamous cell carcinoma of right lung. Radiation therapy completed in 2018. Immunotherapy ongoing. No complaints. EXAM: CT CHEST, ABDOMEN, AND PELVIS WITH CONTRAST TECHNIQUE: Multidetector CT imaging of the chest, abdomen and pelvis was performed following the standard protocol during bolus administration of intravenous contrast. CONTRAST:  152m OMNIPAQUE IOHEXOL 300 MG/ML  SOLN COMPARISON:  Chest CT 12/01/2018. PET 05/07/2018. Most recent abdominal CT of 02/17/2017. FINDINGS: CT CHEST FINDINGS  Cardiovascular: Right Port-A-Cath tip at low SVC. Aortic and branch vessel atherosclerosis. Tortuous thoracic aorta. Normal heart size, without pericardial effusion. Multivessel coronary artery atherosclerosis. No central pulmonary embolism, on this non-dedicated study. Mediastinum/Nodes: No supraclavicular adenopathy. No mediastinal or hilar adenopathy. Lungs/Pleura: Small volume right-sided pleural fluid with mild loculation, minimally increased. No right pleural effusion. Mild motion degradation in the upper and mid chest. Central right upper lobe lung mass measures 4.6 x 3.7 cm on image 50/6. Compare 5.0 x 3.9 cm on the prior exam (when remeasured). Mild centrilobular emphysema. A minimally cavitary superior segment left lower  lobe pulmonary nodule measures 1.3 cm on image 58/6 and has enlarged from 7 mm on the prior exam. Musculoskeletal: No acute osseous abnormality. CT ABDOMEN PELVIS FINDINGS Hepatobiliary: Mild motion degradation continuing into the upper abdomen. Normal liver. Cholecystectomy, without biliary ductal dilatation. Pancreas: Normal, without mass or ductal dilatation. Spleen: Capsular based splenic lesion measures 1.0 cm and is similar to on the prior, favoring a cyst or lymphangioma. Adrenals/Urinary Tract: Left greater than right adrenal thickening, without dominant mass. Bilateral too small to characterize renal lesions. Upper pole left renal cysts. 1.0 cm upper pole enhancing left renal mass on coronal image 95 is similar to on the prior chest CT. Decompressed urinary bladder. Stomach/Bowel: Normal stomach, without wall thickening. Scattered colonic diverticula. Right hemicolectomy. Normal small bowel. Vascular/Lymphatic: Advanced aortic and branch vessel atherosclerosis. Patent renal veins. No abdominopelvic adenopathy. Reproductive: Uterus is positioned eccentric left.  No adnexal mass. Other: No significant free fluid. Mild pelvic floor laxity. No evidence of omental or peritoneal disease. Fat containing paramidline ventral abdominal wall hernia. Musculoskeletal: Lumbosacral spondylosis. IMPRESSION: 1. The right upper lobe lung mass is similar to minimally decreased in size. 2. Enlargement of a superior segment left lower lobe pulmonary nodule, consistent with metastatic disease versus metachronous primary. 3. Slight increase in small, partially loculated right-sided pleural effusion. 4. No thoracic adenopathy. 5. Aortic atherosclerosis (ICD10-I70.0), coronary artery atherosclerosis and emphysema (ICD10-J43.9). 6. No evidence of metastatic disease in the abdomen or pelvis. 7. Similar size of an upper pole left renal lesion, again suspicious for renal cell carcinoma. Electronically Signed   By: Abigail Miyamoto M.D.    On: 02/18/2019 09:26      IMPRESSION: Recurrent non-small cell lung cancer, now with enlarging pulmonary nodule in the superior segment of the left lower lobe, consistent with metastatic disease versus metachronous primary.  The patient would be a good candidate for SBRT directed at her enlarging pulmonary nodule in the superior segment of the left lower lobe.   Today, I talked to the patient and daughter about the findings and work-up thus far.  We discussed the natural history of recurrent non-small cell lung cancer and general treatment, highlighting the role of radiotherapy in the management.  We discussed the available radiation techniques, and focused on the details of logistics and delivery.  We reviewed the anticipated acute and late sequelae associated with radiation in this setting.  The patient was encouraged to ask questions that I answered to the best of my ability.  A patient consent form was discussed and signed.  We retained a copy for our records.  The patient would like to proceed with radiation and will be scheduled for CT simulation.  PLAN: The patient is scheduled for SBRT CT simulation on August 19th at 10:30 AM with treatments to begin approximately one week later. Anticipate 3 SBRT treatments.     ------------------------------------------------  Blair Promise, PhD, MD  This  document serves as a record of services personally performed by Gery Pray, MD. It was created on his behalf by Rae Lips, a trained medical scribe. The creation of this record is based on the scribe's personal observations and the provider's statements to them. This document has been checked and approved by the attending provider.

## 2019-02-28 ENCOUNTER — Other Ambulatory Visit: Payer: Self-pay | Admitting: Neurology

## 2019-03-02 ENCOUNTER — Other Ambulatory Visit: Payer: Self-pay

## 2019-03-02 MED ORDER — DIVALPROEX SODIUM ER 500 MG PO TB24: ORAL_TABLET | ORAL | 6 refills | Status: DC

## 2019-03-04 ENCOUNTER — Other Ambulatory Visit: Payer: Self-pay

## 2019-03-04 ENCOUNTER — Ambulatory Visit
Admission: RE | Admit: 2019-03-04 | Discharge: 2019-03-04 | Disposition: A | Discharge status: Home / Self Care | Payer: Medicare Other | Source: Ambulatory Visit | Attending: Radiation Oncology | Admitting: Radiation Oncology

## 2019-03-04 DIAGNOSIS — C3411 Malignant neoplasm of upper lobe, right bronchus or lung: Secondary | ICD-10-CM | POA: Diagnosis not present

## 2019-03-04 DIAGNOSIS — F17210 Nicotine dependence, cigarettes, uncomplicated: Secondary | ICD-10-CM | POA: Insufficient documentation

## 2019-03-04 DIAGNOSIS — Z510 Encounter for antineoplastic radiation therapy: Secondary | ICD-10-CM | POA: Diagnosis not present

## 2019-03-04 DIAGNOSIS — C3432 Malignant neoplasm of lower lobe, left bronchus or lung: Secondary | ICD-10-CM | POA: Diagnosis not present

## 2019-03-10 ENCOUNTER — Inpatient Hospital Stay (HOSPITAL_BASED_OUTPATIENT_CLINIC_OR_DEPARTMENT_OTHER): Payer: Medicare Other | Admitting: Internal Medicine

## 2019-03-10 ENCOUNTER — Inpatient Hospital Stay: Payer: Medicare Other

## 2019-03-10 ENCOUNTER — Other Ambulatory Visit: Payer: Self-pay

## 2019-03-10 ENCOUNTER — Encounter: Payer: Self-pay | Admitting: Internal Medicine

## 2019-03-10 VITALS — BP 116/62 | HR 93 | Temp 98.9°F | Resp 18 | Ht 66.0 in | Wt 141.1 lb

## 2019-03-10 DIAGNOSIS — E119 Type 2 diabetes mellitus without complications: Secondary | ICD-10-CM | POA: Diagnosis not present

## 2019-03-10 DIAGNOSIS — Z7984 Long term (current) use of oral hypoglycemic drugs: Secondary | ICD-10-CM | POA: Diagnosis not present

## 2019-03-10 DIAGNOSIS — Z95828 Presence of other vascular implants and grafts: Secondary | ICD-10-CM

## 2019-03-10 DIAGNOSIS — C3491 Malignant neoplasm of unspecified part of right bronchus or lung: Secondary | ICD-10-CM

## 2019-03-10 DIAGNOSIS — Z5112 Encounter for antineoplastic immunotherapy: Secondary | ICD-10-CM | POA: Diagnosis not present

## 2019-03-10 DIAGNOSIS — Z87891 Personal history of nicotine dependence: Secondary | ICD-10-CM | POA: Diagnosis not present

## 2019-03-10 DIAGNOSIS — Z9221 Personal history of antineoplastic chemotherapy: Secondary | ICD-10-CM | POA: Diagnosis not present

## 2019-03-10 DIAGNOSIS — I10 Essential (primary) hypertension: Secondary | ICD-10-CM | POA: Diagnosis not present

## 2019-03-10 DIAGNOSIS — Z79899 Other long term (current) drug therapy: Secondary | ICD-10-CM | POA: Diagnosis not present

## 2019-03-10 DIAGNOSIS — Z923 Personal history of irradiation: Secondary | ICD-10-CM | POA: Diagnosis not present

## 2019-03-10 DIAGNOSIS — Z8673 Personal history of transient ischemic attack (TIA), and cerebral infarction without residual deficits: Secondary | ICD-10-CM | POA: Diagnosis not present

## 2019-03-10 DIAGNOSIS — Z7982 Long term (current) use of aspirin: Secondary | ICD-10-CM | POA: Diagnosis not present

## 2019-03-10 DIAGNOSIS — Z9049 Acquired absence of other specified parts of digestive tract: Secondary | ICD-10-CM | POA: Diagnosis not present

## 2019-03-10 DIAGNOSIS — C3411 Malignant neoplasm of upper lobe, right bronchus or lung: Secondary | ICD-10-CM | POA: Diagnosis not present

## 2019-03-10 LAB — CBC WITH DIFFERENTIAL (CANCER CENTER ONLY)
Abs Immature Granulocytes: 0.03 10*3/uL (ref 0.00–0.07)
Basophils Absolute: 0 10*3/uL (ref 0.0–0.1)
Basophils Relative: 0 %
Eosinophils Absolute: 0.3 10*3/uL (ref 0.0–0.5)
Eosinophils Relative: 3 %
HCT: 35.4 % — ABNORMAL LOW (ref 36.0–46.0)
Hemoglobin: 11.4 g/dL — ABNORMAL LOW (ref 12.0–15.0)
Immature Granulocytes: 0 %
Lymphocytes Relative: 16 %
Lymphs Abs: 1.5 10*3/uL (ref 0.7–4.0)
MCH: 29.4 pg (ref 26.0–34.0)
MCHC: 32.2 g/dL (ref 30.0–36.0)
MCV: 91.2 fL (ref 80.0–100.0)
Monocytes Absolute: 0.8 10*3/uL (ref 0.1–1.0)
Monocytes Relative: 9 %
Neutro Abs: 6.7 10*3/uL (ref 1.7–7.7)
Neutrophils Relative %: 72 %
Platelet Count: 409 10*3/uL — ABNORMAL HIGH (ref 150–400)
RBC: 3.88 MIL/uL (ref 3.87–5.11)
RDW: 13.8 % (ref 11.5–15.5)
WBC Count: 9.3 10*3/uL (ref 4.0–10.5)
nRBC: 0 % (ref 0.0–0.2)

## 2019-03-10 LAB — CMP (CANCER CENTER ONLY)
ALT: 9 U/L (ref 0–44)
AST: 11 U/L — ABNORMAL LOW (ref 15–41)
Albumin: 3.4 g/dL — ABNORMAL LOW (ref 3.5–5.0)
Alkaline Phosphatase: 73 U/L (ref 38–126)
Anion gap: 11 (ref 5–15)
BUN: 23 mg/dL (ref 8–23)
CO2: 28 mmol/L (ref 22–32)
Calcium: 9.6 mg/dL (ref 8.9–10.3)
Chloride: 102 mmol/L (ref 98–111)
Creatinine: 1.29 mg/dL — ABNORMAL HIGH (ref 0.44–1.00)
GFR, Est AFR Am: 46 mL/min — ABNORMAL LOW (ref >60)
GFR, Estimated: 40 mL/min — ABNORMAL LOW (ref >60)
Glucose, Bld: 178 mg/dL — ABNORMAL HIGH (ref 70–99)
Potassium: 4.1 mmol/L (ref 3.5–5.1)
Sodium: 141 mmol/L (ref 135–145)
Total Bilirubin: 0.4 mg/dL (ref 0.3–1.2)
Total Protein: 6.7 g/dL (ref 6.5–8.1)

## 2019-03-10 MED ORDER — SODIUM CHLORIDE 0.9% FLUSH
10.0000 mL | INTRAVENOUS | Status: DC | PRN
  Administered 2019-03-10: 10 mL
  Filled 2019-03-10: qty 10

## 2019-03-10 MED ORDER — HEPARIN SOD (PORK) LOCK FLUSH 100 UNIT/ML IV SOLN
500.0000 [IU] | Freq: Once | INTRAVENOUS | Status: AC | PRN
  Administered 2019-03-10: 500 [IU]
  Filled 2019-03-10: qty 5

## 2019-03-10 MED ORDER — SODIUM CHLORIDE 0.9 % IV SOLN
Freq: Once | INTRAVENOUS | Status: AC
  Administered 2019-03-10: 15:00:00 via INTRAVENOUS
  Filled 2019-03-10: qty 250

## 2019-03-10 MED ORDER — SODIUM CHLORIDE 0.9 % IV SOLN
10.2000 mg/kg | Freq: Once | INTRAVENOUS | Status: AC
  Administered 2019-03-10: 620 mg via INTRAVENOUS
  Filled 2019-03-10: qty 10

## 2019-03-10 NOTE — Progress Notes (Signed)
[place] [phone]   Fax:([phone]) [phone]  OFFICE PROGRESS NOTE  [name], MD [place] [address]  DIAGNOSIS:Recurrent non-small cell lung cancer likely squamous cell carcinoma that was initially diagnosed as a stage IIA(T2b, N0, M0) inAugust 2018status post curative stereotactic radiotherapy completed June 21, 2017. The patient presented today with concerning findings for disease recurrence and progression with enlarging and hypermetabolic right upper lobe lung mass presenting as stage IIIa (T3, N0, M0).  PDL 1 expression 0%.  PRIOR THERAPY: 1) Curative stereotactic radiotherapy completed on June 21, 2017. 2) A course of concurrent chemoradiation with chemotherapy consisting of carboplatin for an AUC of 2 and paclitaxel 45 mg/m.  First dose started on 06/23/2018.  Status post 7 cycles.  Last dose of chemotherapy was given August 04, 2018.  CURRENT THERAPY:Consolidation immunotherapy with Imfinzi 10 mg/KG every 2 weeks.  First dose September 09, 2018.  Status post 13 cycles.  INTERVAL HISTORY: [name] 78 y.o. female returns to the clinic today for follow-up visit.  The patient is feeling fine today with no concerning complaints.  She denied having any current chest pain, shortness of breath, cough or hemoptysis.  She denied having any fever or chills.  She has no nausea, vomiting, diarrhea or constipation.  She denied having any headache or visual changes.  She continues to tolerate her treatment with Imfinzi fairly well.  She is here today for evaluation before starting cycle #14.  MEDICAL HISTORY: Past Medical History:  Diagnosis Date  . Cancer ([place])    Lung   . Carotid artery occlusion   . Clostridium difficile infection 06/2013  . Diabetes mellitus    takes Janumet daily  . Dyslipidemia    takes Crestor daily  . Gallstones   . GERD (gastroesophageal reflux disease)   . Heart murmur    hx  of  . History of radiation therapy 06/11/17-06/21/17   right lung 50 Gy in 5 fractions  . Hypertension    takes Prinzide and Verapamil daily  . Impaired speech    from stroke  . Neuropathy, diabetic ([place])   . Pneumonia   . PONV (postoperative nausea and vomiting)   . Seizures ([place]) 04/19/2018   recent hospitalization  . Smoker   . Stroke ([place]) 06/25/13  . Vertigo    but doesn't take any meds    ALLERGIES:  is allergic to codeine; benadryl [diphenhydramine]; lipitor [atorvastatin]; and phenergan [promethazine hcl].  MEDICATIONS:  Current Outpatient Medications  Medication Sig Dispense Refill  . aspirin EC 81 MG tablet Take 81 mg by mouth daily.    . Blood Glucose Monitoring Suppl (ONE TOUCH ULTRA 2) w/Device KIT 1 Device by Does not apply route 2 (two) times daily. 1 each 0  . clopidogrel (PLAVIX) 75 MG tablet Take 1 tablet (75 mg total) by mouth daily. 90 tablet 3  . divalproex (DEPAKOTE ER) 500 MG 24 hr tablet Take one tablet daily 30 tablet 6  . glucose blood (ONE TOUCH ULTRA TEST) test strip Use as instructed 100 each 12  . Lancets (ONETOUCH ULTRASOFT) lancets Use as instructed 100 each 12  . lidocaine-prilocaine (EMLA) cream Apply 1 application topically as needed. 30 g 0  . lisinopril (PRINIVIL,ZESTRIL) 10 MG tablet Take 1 tablet (10 mg total) by mouth daily. 90 tablet 3  . Multiple Vitamins-Minerals (MULTIVITAMIN PO) Take 1 tablet by mouth daily.    . pioglitazone (ACTOS) 30 MG tablet Take 1 tablet (  30 mg total) by mouth daily. 90 tablet 1  . prochlorperazine (COMPAZINE) 10 MG tablet Take 1 tablet (10 mg total) by mouth every 6 (six) hours as needed for nausea or vomiting. 30 tablet 0  . rosuvastatin (CRESTOR) 20 MG tablet TAKE 1 TABLET BY MOUTH ONCE DAILY (Patient taking differently: Take 20 mg by mouth daily. ) 180 tablet 0  . SitaGLIPtin-MetFORMIN HCl (JANUMET XR) 50-1000 MG TB24 Take 1 tablet by mouth daily. 60 tablet 5  . vitamin B-12 (CYANOCOBALAMIN) 1000 MCG tablet  Take 1,000 mcg by mouth daily.     No current facility-administered medications for this visit.     SURGICAL HISTORY:  Past Surgical History:  Procedure Laterality Date  . cataract removed Right   . CHOLECYSTECTOMY    . COLONOSCOPY    . ENDARTERECTOMY Left 07/14/2013   Procedure: ENDARTERECTOMY CAROTID-LEFT;  Surgeon: Elam Dutch, MD;  Location: [facility];  Service: Vascular;  Laterality: Left;  . EYE SURGERY Left    cataract  . IR IMAGING GUIDED PORT INSERTION  09/15/2018  . IR PERC PLEURAL DRAIN W/INDWELL CATH W/IMG GUIDE  05/23/2017  . KNEE SURGERY Left 24yr ago  . LAPAROSCOPIC PARTIAL COLECTOMY N/A 09/02/2015   Procedure: LAPAROSCOPIC PARTIAL RIGHT COLECTOMY;  Surgeon: ALeighton Ruff MD;  Location: [facility];  Service: General;  Laterality: N/A;  . PATCH ANGIOPLASTY Left 07/14/2013   Procedure: LEFT CAROTID ARTERY PATCH ANGIOPLASTY;  Surgeon: CElam Dutch MD;  [facility]  Service: Vascular;  Laterality: Left;  .Marland KitchenVIDEO BRONCHOSCOPY N/A 06/04/2018   Procedure: VIDEO BRONCHOSCOPY;  Surgeon: HMelrose Nakayama MD;  [facility];  Service: Thoracic;  Laterality: N/A;  . wisdom      REVIEW OF SYSTEMS:  A comprehensive review of systems was negative.   PHYSICAL EXAMINATION: General appearance: alert, cooperative and no distress Head: Normocephalic, without obvious abnormality, atraumatic Neck: no adenopathy, no JVD, supple, symmetrical, trachea midline and thyroid not enlarged, symmetric, no tenderness/mass/nodules Lymph nodes: Cervical, supraclavicular, and axillary nodes normal. Resp: clear to auscultation bilaterally Back: symmetric, no curvature. ROM normal. No CVA tenderness. Cardio: regular rate and rhythm, S1, S2 normal, no murmur, click, rub or gallop GI: soft, non-tender; bowel sounds normal; no masses,  no organomegaly Extremities: extremities normal, atraumatic, no cyanosis or edema  ECOG PERFORMANCE STATUS: 1 - Symptomatic but completely  ambulatory  Blood pressure 116/62, pulse 93, temperature 98.9 F (37.2 C), temperature source Oral, resp. rate 18, height '5\' 6"'  (1.676 m), weight 141 lb 1.6 oz (64 kg), SpO2 100 %.  LABORATORY DATA: Lab Results  Component Value Date   WBC 10.3 02/24/2019   HGB 12.2 02/24/2019   HCT 37.2 02/24/2019   MCV 91.6 02/24/2019   PLT 392 02/24/2019      Chemistry      Component Value Date/Time   NA 139 02/24/2019 1214   NA 139 05/07/2018 1147   K 3.9 02/24/2019 1214   CL 102 02/24/2019 1214   CO2 25 02/24/2019 1214   BUN 20 02/24/2019 1214   BUN 15 05/07/2018 1147   CREATININE 0.97 02/24/2019 1214   CREATININE 0.87 03/08/2015 0001      Component Value Date/Time   CALCIUM 9.6 02/24/2019 1214   ALKPHOS 65 02/24/2019 1214   AST 14 (L) 02/24/2019 1214   ALT 12 02/24/2019 1214   BILITOT 0.7 02/24/2019 1214       RADIOGRAPHIC STUDIES: Ct Chest W Contrast  Result Date: 02/18/2019 CLINICAL DATA:  Stage III squamous  cell carcinoma of right lung. Radiation therapy completed in 2018. Immunotherapy ongoing. No complaints. EXAM: CT CHEST, ABDOMEN, AND PELVIS WITH CONTRAST TECHNIQUE: Multidetector CT imaging of the chest, abdomen and pelvis was performed following the standard protocol during bolus administration of intravenous contrast. CONTRAST:  157m OMNIPAQUE IOHEXOL 300 MG/ML  SOLN COMPARISON:  Chest CT 12/01/2018. PET 05/07/2018. Most recent abdominal CT of 02/17/2017. FINDINGS: CT CHEST FINDINGS Cardiovascular: Right Port-A-Cath tip at low SVC. Aortic and branch vessel atherosclerosis. Tortuous thoracic aorta. Normal heart size, without pericardial effusion. Multivessel coronary artery atherosclerosis. No central pulmonary embolism, on this non-dedicated study. Mediastinum/Nodes: No supraclavicular adenopathy. No mediastinal or hilar adenopathy. Lungs/Pleura: Small volume right-sided pleural fluid with mild loculation, minimally increased. No right pleural effusion. Mild motion degradation  in the upper and mid chest. Central right upper lobe lung mass measures 4.6 x 3.7 cm on image 50/6. Compare 5.0 x 3.9 cm on the prior exam (when remeasured). Mild centrilobular emphysema. A minimally cavitary superior segment left lower lobe pulmonary nodule measures 1.3 cm on image 58/6 and has enlarged from 7 mm on the prior exam. Musculoskeletal: No acute osseous abnormality. CT ABDOMEN PELVIS FINDINGS Hepatobiliary: Mild motion degradation continuing into the upper abdomen. Normal liver. Cholecystectomy, without biliary ductal dilatation. Pancreas: Normal, without mass or ductal dilatation. Spleen: Capsular based splenic lesion measures 1.0 cm and is similar to on the prior, favoring a cyst or lymphangioma. Adrenals/Urinary Tract: Left greater than right adrenal thickening, without dominant mass. Bilateral too small to characterize renal lesions. Upper pole left renal cysts. 1.0 cm upper pole enhancing left renal mass on coronal image 95 is similar to on the prior chest CT. Decompressed urinary bladder. Stomach/Bowel: Normal stomach, without wall thickening. Scattered colonic diverticula. Right hemicolectomy. Normal small bowel. Vascular/Lymphatic: Advanced aortic and branch vessel atherosclerosis. Patent renal veins. No abdominopelvic adenopathy. Reproductive: Uterus is positioned eccentric left.  No adnexal mass. Other: No significant free fluid. Mild pelvic floor laxity. No evidence of omental or peritoneal disease. Fat containing paramidline ventral abdominal wall hernia. Musculoskeletal: Lumbosacral spondylosis. IMPRESSION: 1. The right upper lobe lung mass is similar to minimally decreased in size. 2. Enlargement of a superior segment left lower lobe pulmonary nodule, consistent with metastatic disease versus metachronous primary. 3. Slight increase in small, partially loculated right-sided pleural effusion. 4. No thoracic adenopathy. 5. Aortic atherosclerosis (ICD10-I70.0), coronary artery atherosclerosis  and emphysema (ICD10-J43.9). 6. No evidence of metastatic disease in the abdomen or pelvis. 7. Similar size of an upper pole left renal lesion, again suspicious for renal cell carcinoma. Electronically Signed   By: KAbigail MiyamotoM.D.   On: 02/18/2019 09:26   Mr BJeri CosWTKContrast  Result Date: 02/20/2019  [facility] [address] [place] [place] [phone] REPORT STUDY DATE: 02/19/2019 PATIENT NAME: Nichole TRENKAMPDOB: 5Jul 29, 1942MRN: [id number]: MRI Brain with and without contrast ORDERING CLINICIAN: PAntony Contras MD CLINICAL HISTORY: 78year old woman with an abnormal brain MRI COMPARISON FILMS: 06/01/2018 TECHNIQUE:MRI of the brain with and without contrast was obtained utilizing 5 mm axial slices with T1, T2, T2 flair, SWI and diffusion weighted views.  T1 sagittal, T2 coronal and postcontrast views in the axial and coronal plane were obtained. CONTRAST: 12 ml [facility] IMAGING SITE: [facility] [place] FINDINGS: On sagittal images, the spinal cord is imaged caudally to C3-C4 and is normal in caliber.   The contents of the posterior fossa are of normal size and position.  The pituitary gland and optic chiasm appear normal.    There is mild generalized cortical atrophy.   There are no abnormal extra-axial collections of fluid.  There is a left frontal lobe focus that is 23 mm in maximum AP diameter that is hyperintense FLAIR and T2-weighted images and hypointense on T1-weighted images.  It does not enhance.  It appears unchanged compared to the 2019 MRI.  Elsewhere in the hemispheres, there are stable T2/flair hyperintense foci consistent with chronic microvascular ischemic change.  The cerebellum and brainstem appears normal.   The deep gray matter appears normal.    Diffusion weighted images are normal.  Susceptibility weighted images are normal.   The VIIth/VIIIth nerve complex appears normal.  There have been bilateral lens  replacements.  The orbits are otherwise normal.  The mastoid air cells appear normal.  The paranasal sinuses appear normal.  Flow voids are identified within the major intracerebral arteries.  After the infusion of contrast material, a normal enhancement pattern is noted.   This MRI of the brain with and without contrast shows the following: 1.  Left frontal focus measuring 23 millimeters in maximal AP diameter.  It is stable compared to the previous MRI.  This most likely represents a chronic stroke.  Low-grade glioma is less likely though cannot be ruled out as there had been some change over the last 6 years. 2.   Mild chronic microvascular ischemic change. 3.   Mild generalized cortical atrophy. 4.   There is a normal enhancement pattern and no acute findings. INTERPRETING PHYSICIAN: Richard A. Felecia Shelling, MD, PhD, FAAN Certified in  Neuroimaging by [employer]   Ct Abdomen Pelvis W Contrast  Result Date: 02/18/2019 CLINICAL DATA:  Stage III squamous cell carcinoma of right lung. Radiation therapy completed in 2018. Immunotherapy ongoing. No complaints. EXAM: CT CHEST, ABDOMEN, AND PELVIS WITH CONTRAST TECHNIQUE: Multidetector CT imaging of the chest, abdomen and pelvis was performed following the standard protocol during bolus administration of intravenous contrast. CONTRAST:  176m OMNIPAQUE IOHEXOL 300 MG/ML  SOLN COMPARISON:  Chest CT 12/01/2018. PET 05/07/2018. Most recent abdominal CT of 02/17/2017. FINDINGS: CT CHEST FINDINGS Cardiovascular: Right Port-A-Cath tip at low SVC. Aortic and branch vessel atherosclerosis. Tortuous thoracic aorta. Normal heart size, without pericardial effusion. Multivessel coronary artery atherosclerosis. No central pulmonary embolism, on this non-dedicated study. Mediastinum/Nodes: No supraclavicular adenopathy. No mediastinal or hilar adenopathy. Lungs/Pleura: Small volume right-sided pleural fluid with mild loculation, minimally increased. No right  pleural effusion. Mild motion degradation in the upper and mid chest. Central right upper lobe lung mass measures 4.6 x 3.7 cm on image 50/6. Compare 5.0 x 3.9 cm on the prior exam (when remeasured). Mild centrilobular emphysema. A minimally cavitary superior segment left lower lobe pulmonary nodule measures 1.3 cm on image 58/6 and has enlarged from 7 mm on the prior exam. Musculoskeletal: No acute osseous abnormality. CT ABDOMEN PELVIS FINDINGS Hepatobiliary: Mild motion degradation continuing into the upper abdomen. Normal liver. Cholecystectomy, without biliary ductal dilatation. Pancreas: Normal, without mass or ductal dilatation. Spleen: Capsular based splenic lesion measures 1.0 cm and is similar to on the prior, favoring a cyst or lymphangioma. Adrenals/Urinary Tract: Left greater than right adrenal thickening, without dominant mass. Bilateral too small to characterize renal lesions. Upper pole left renal cysts. 1.0 cm upper pole enhancing left renal mass on coronal image 95 is similar to on the prior chest CT. Decompressed urinary bladder. Stomach/Bowel: Normal stomach, without wall thickening. Scattered colonic diverticula. Right hemicolectomy.  Normal small bowel. Vascular/Lymphatic: Advanced aortic and branch vessel atherosclerosis. Patent renal veins. No abdominopelvic adenopathy. Reproductive: Uterus is positioned eccentric left.  No adnexal mass. Other: No significant free fluid. Mild pelvic floor laxity. No evidence of omental or peritoneal disease. Fat containing paramidline ventral abdominal wall hernia. Musculoskeletal: Lumbosacral spondylosis. IMPRESSION: 1. The right upper lobe lung mass is similar to minimally decreased in size. 2. Enlargement of a superior segment left lower lobe pulmonary nodule, consistent with metastatic disease versus metachronous primary. 3. Slight increase in small, partially loculated right-sided pleural effusion. 4. No thoracic adenopathy. 5. Aortic atherosclerosis  (ICD10-I70.0), coronary artery atherosclerosis and emphysema (ICD10-J43.9). 6. No evidence of metastatic disease in the abdomen or pelvis. 7. Similar size of an upper pole left renal lesion, again suspicious for renal cell carcinoma. Electronically Signed   By: Abigail Miyamoto M.D.   On: 02/18/2019 09:26    ASSESSMENT AND PLAN: This is a very pleasant 78 years old white female with recurrent non-small cell lung cancer presented as a stage IIIa involving the right lower lobe. The patient underwent a course of concurrent chemoradiation with weekly carboplatin and paclitaxel status post 7 cycles.  She tolerated this treatment well. She had repeat CT scan of the chest performed recently.  I personally and independently reviewed the scan images and discussed the results with the patient and her daughter. Her scan showed improvement of the right lung mass with no other concerning findings for progression. The patient was started on treatment with consolidation immunotherapy with Imfinzi status post 13 cycles.   She continues to tolerate this treatment well. I recommended for her to proceed with cycle #14 today as planned. For hypertension, she will continue to take her blood pressure medication as prescribed. I will see the patient back for follow-up visit in 2 weeks for evaluation before the next cycle of her treatment. She was advised to call immediately if she has any concerning symptoms in the interval. The patient voices understanding of current disease status and treatment options and is in agreement with the current care plan. All questions were answered. The patient knows to call the clinic with any problems, questions or concerns. We can certainly see the patient much sooner if necessary.  Disclaimer: This note was dictated with voice recognition software. Similar sounding words can inadvertently be transcribed and may not be corrected upon review.

## 2019-03-10 NOTE — Patient Instructions (Signed)
Durvalumab injection What is this medicine? DURVALUMAB (dur VAL ue mab) is a monoclonal antibody. It is used to treat urothelial cancer and lung cancer. This medicine may be used for other purposes; ask your health care provider or pharmacist if you have questions. COMMON BRAND NAME(S): IMFINZI What should I tell my health care provider before I take this medicine? They need to know if you have any of these conditions:  diabetes  immune system problems  infection  inflammatory bowel disease  kidney disease  liver disease  lung or breathing disease  lupus  organ transplant  stomach or intestine problems  thyroid disease  an unusual or allergic reaction to durvalumab, other medicines, foods, dyes, or preservatives  pregnant or trying to get pregnant  breast-feeding How should I use this medicine? This medicine is for infusion into a vein. It is given by a health care professional in a hospital or clinic setting. A special MedGuide will be given to you before each treatment. Be sure to read this information carefully each time. Talk to your pediatrician regarding the use of this medicine in children. Special care may be needed. Overdosage: If you think you have taken too much of this medicine contact a poison control center or emergency room at once. NOTE: This medicine is only for you. Do not share this medicine with others. What if I miss a dose? It is important not to miss your dose. Call your doctor or health care professional if you are unable to keep an appointment. What may interact with this medicine? Interactions have not been studied. This list may not describe all possible interactions. Give your health care provider a list of all the medicines, herbs, non-prescription drugs, or dietary supplements you use. Also tell them if you smoke, drink alcohol, or use illegal drugs. Some items may interact with your medicine. What should I watch for while using this  medicine? This drug may make you feel generally unwell. Continue your course of treatment even though you feel ill unless your doctor tells you to stop. You may need blood work done while you are taking this medicine. Do not become pregnant while taking this medicine or for 3 months after stopping it. Women should inform their doctor if they wish to become pregnant or think they might be pregnant. There is a potential for serious side effects to an unborn child. Talk to your health care professional or pharmacist for more information. Do not breast-feed an infant while taking this medicine or for 3 months after stopping it. What side effects may I notice from receiving this medicine? Side effects that you should report to your doctor or health care professional as soon as possible:  allergic reactions like skin rash, itching or hives, swelling of the face, lips, or tongue  black, tarry stools  bloody or watery diarrhea  breathing problems  change in emotions or moods  change in sex drive  changes in vision  chest pain or chest tightness  chills  confusion  cough  facial flushing  fever  headache  signs and symptoms of high blood sugar such as dizziness; dry mouth; dry skin; fruity breath; nausea; stomach pain; increased hunger or thirst; increased urination  signs and symptoms of liver injury like dark yellow or brown urine; general ill feeling or flu-like symptoms; light-colored stools; loss of appetite; nausea; right upper belly pain; unusually weak or tired; yellowing of the eyes or skin  stomach pain  trouble passing urine or change in   the amount of urine  weight gain or weight loss Side effects that usually do not require medical attention (report these to your doctor or health care professional if they continue or are bothersome):  bone pain  constipation  loss of appetite  muscle pain  nausea  swelling of the ankles, feet, hands  tiredness This list  may not describe all possible side effects. Call your doctor for medical advice about side effects. You may report side effects to FDA at 1-800-FDA-1088. Where should I keep my medicine? This drug is given in a hospital or clinic and will not be stored at home. NOTE: This sheet is a summary. It may not cover all possible information. If you have questions about this medicine, talk to your doctor, pharmacist, or health care provider.  2020 Elsevier/Gold Standard (2016-09-11 19:25:04)  Coronavirus (COVID-19) Are you at risk?  Are you at risk for the Coronavirus (COVID-19)?  To be considered HIGH RISK for Coronavirus (COVID-19), you have to meet the following criteria:  . Traveled to [place], [place], [place], [place] or [place]; or in the [place] to [place], [place], [place], or [place]; and have fever, cough, and shortness of breath within the last 2 weeks of travel OR . Been in close contact with a person diagnosed with COVID-19 within the last 2 weeks and have fever, cough, and shortness of breath . IF YOU DO NOT MEET THESE CRITERIA, YOU ARE CONSIDERED LOW RISK FOR COVID-19.  What to do if you are HIGH RISK for COVID-19?  . If you are having a medical emergency, call 911. . Seek medical care right away. Before you go to a doctor's office, urgent care or emergency department, call ahead and tell them about your recent travel, contact with someone diagnosed with COVID-19, and your symptoms. You should receive instructions from your physician's office regarding next steps of care.  . When you arrive at healthcare provider, tell the healthcare staff immediately you have returned from visiting [place], [place], [place], [place] or [place]; or traveled in the [place] to [place], [place], [place], or [place]; in the last two weeks or you have been in close contact with a person diagnosed with COVID-19 in the last 2 weeks.   . Tell the health care staff about your symptoms:  fever, cough and shortness of breath. . After you have been seen by a medical provider, you will be either: o Tested for (COVID-19) and discharged home on quarantine except to seek medical care if symptoms worsen, and asked to  - Stay home and avoid contact with others until you get your results (4-5 days)  - Avoid travel on public transportation if possible (such as bus, train, or airplane) or o Sent to the Emergency Department by EMS for evaluation, COVID-19 testing, and possible admission depending on your condition and test results.  What to do if you are LOW RISK for COVID-19?  Reduce your risk of any infection by using the same precautions used for avoiding the common cold or flu:  . Wash your hands often with soap and warm water for at least 20 seconds.  If soap and water are not readily available, use an alcohol-based hand sanitizer with at least 60% alcohol.  . If coughing or sneezing, cover your mouth and nose by coughing or sneezing into the elbow areas of your shirt or coat, into a tissue or into your sleeve (not your hands). . Avoid shaking hands with others   and consider head nods or verbal greetings only. . Avoid touching your eyes, nose, or mouth with unwashed hands.  . Avoid close contact with people who are sick. . Avoid places or events with large numbers of people in one location, like concerts or sporting events. . Carefully consider travel plans you have or are making. . If you are planning any travel outside or inside the [place], visit the CDC's Travelers' Health webpage for the latest health notices. . If you have some symptoms but not all symptoms, continue to monitor at home and seek medical attention if your symptoms worsen. . If you are having a medical emergency, call 911.   ADDITIONAL HEALTHCARE OPTIONS FOR PATIENTS  [facility] / e-Visit: [email or web address]         [facility]: [phone]  [facility]: [phone]                   [facility]: [phone]   

## 2019-03-11 ENCOUNTER — Ambulatory Visit: Payer: Medicare Other | Admitting: Radiation Oncology

## 2019-03-11 DIAGNOSIS — C3411 Malignant neoplasm of upper lobe, right bronchus or lung: Secondary | ICD-10-CM | POA: Diagnosis not present

## 2019-03-11 DIAGNOSIS — Z510 Encounter for antineoplastic radiation therapy: Secondary | ICD-10-CM | POA: Diagnosis not present

## 2019-03-11 DIAGNOSIS — C3432 Malignant neoplasm of lower lobe, left bronchus or lung: Secondary | ICD-10-CM | POA: Diagnosis not present

## 2019-03-12 ENCOUNTER — Other Ambulatory Visit: Payer: Self-pay

## 2019-03-12 ENCOUNTER — Ambulatory Visit
Admission: RE | Admit: 2019-03-12 | Discharge: 2019-03-12 | Disposition: A | Discharge status: Home / Self Care | Payer: Medicare Other | Source: Ambulatory Visit | Attending: Radiation Oncology | Admitting: Radiation Oncology

## 2019-03-12 DIAGNOSIS — Z510 Encounter for antineoplastic radiation therapy: Secondary | ICD-10-CM | POA: Diagnosis not present

## 2019-03-12 DIAGNOSIS — C3411 Malignant neoplasm of upper lobe, right bronchus or lung: Secondary | ICD-10-CM | POA: Diagnosis not present

## 2019-03-12 DIAGNOSIS — C3491 Malignant neoplasm of unspecified part of right bronchus or lung: Secondary | ICD-10-CM

## 2019-03-12 NOTE — Progress Notes (Signed)
  Radiation Oncology         [phone]) [phone] ________________________________  Name: [name] MRN: [id number]  Date: 03/12/2019  DOB: [birth year]  Stereotactic Body Radiotherapy Treatment Procedure Note  NARRATIVE:  [name] was brought to the stereotactic radiation treatment machine and placed supine on the CT couch. The patient was set up for stereotactic body radiotherapy on the body fix pillow.  3D TREATMENT PLANNING AND DOSIMETRY:  The patient's radiation plan was reviewed and approved prior to starting treatment.  It showed 3-dimensional radiation distributions overlaid onto the planning CT.  The [facility] for the target structures as well as the organs at risk were reviewed. The documentation of this is filed in the radiation oncology EMR.  SIMULATION VERIFICATION:  The patient underwent CT imaging on the treatment unit.  These were carefully aligned to document that the ablative radiation dose would cover the target volume and maximally spare the nearby organs at risk according to the planned distribution.  SPECIAL TREATMENT PROCEDURE: [name] received high dose ablative stereotactic body radiotherapy to the planned target volume without unforeseen complications. Treatment was delivered uneventfully. The high doses associated with stereotactic body radiotherapy and the significant potential risks require careful treatment set up and patient monitoring constituting a special treatment procedure   STEREOTACTIC TREATMENT MANAGEMENT:  Following delivery, the patient was evaluated clinically. The patient tolerated treatment without significant acute effects, and was discharged to home in stable condition.    PLAN: Continue treatment as planned.  ________________________________  Blair Promise, PhD, MD   This document serves as a record of services personally performed by Gery Pray, MD. It was created on his behalf by Wilburn Mylar, a trained medical scribe. The  creation of this record is based on the scribe's personal observations and the provider's statements to them. This document has been checked and approved by the attending provider.

## 2019-03-16 ENCOUNTER — Other Ambulatory Visit: Payer: Self-pay

## 2019-03-16 ENCOUNTER — Other Ambulatory Visit (INDEPENDENT_AMBULATORY_CARE_PROVIDER_SITE_OTHER): Payer: Medicare Other

## 2019-03-16 ENCOUNTER — Ambulatory Visit: Payer: Medicare Other | Admitting: Radiation Oncology

## 2019-03-16 DIAGNOSIS — E538 Deficiency of other specified B group vitamins: Secondary | ICD-10-CM

## 2019-03-16 MED ORDER — CYANOCOBALAMIN 1000 MCG/ML IJ SOLN
1000.0000 ug | Freq: Once | INTRAMUSCULAR | Status: AC
  Administered 2019-03-16: 14:00:00 1000 ug via INTRAMUSCULAR

## 2019-03-17 ENCOUNTER — Other Ambulatory Visit: Payer: Self-pay

## 2019-03-17 ENCOUNTER — Ambulatory Visit
Admission: RE | Admit: 2019-03-17 | Discharge: 2019-03-17 | Disposition: A | Discharge status: Home / Self Care | Payer: Medicare Other | Source: Ambulatory Visit | Attending: Radiation Oncology | Admitting: Radiation Oncology

## 2019-03-17 DIAGNOSIS — F17210 Nicotine dependence, cigarettes, uncomplicated: Secondary | ICD-10-CM | POA: Diagnosis not present

## 2019-03-17 DIAGNOSIS — Z510 Encounter for antineoplastic radiation therapy: Secondary | ICD-10-CM | POA: Insufficient documentation

## 2019-03-17 DIAGNOSIS — C3492 Malignant neoplasm of unspecified part of left bronchus or lung: Secondary | ICD-10-CM

## 2019-03-17 DIAGNOSIS — C3411 Malignant neoplasm of upper lobe, right bronchus or lung: Secondary | ICD-10-CM | POA: Diagnosis not present

## 2019-03-17 NOTE — Progress Notes (Signed)
  Radiation Oncology         [phone]) [phone] ________________________________  Name: [name] MRN: [id number]  Date: 03/17/2019  DOB: [birth year]  Stereotactic Body Radiotherapy Treatment Procedure Note  NARRATIVE:  [name] was brought to the stereotactic radiation treatment machine and placed supine on the CT couch. The patient was set up for stereotactic body radiotherapy on the body fix pillow.  3D TREATMENT PLANNING AND DOSIMETRY:  The patient's radiation plan was reviewed and approved prior to starting treatment.  It showed 3-dimensional radiation distributions overlaid onto the planning CT.  The [facility] for the target structures as well as the organs at risk were reviewed. The documentation of this is filed in the radiation oncology EMR.  SIMULATION VERIFICATION:  The patient underwent CT imaging on the treatment unit.  These were carefully aligned to document that the ablative radiation dose would cover the target volume and maximally spare the nearby organs at risk according to the planned distribution.  SPECIAL TREATMENT PROCEDURE: [name] received high dose ablative stereotactic body radiotherapy to the planned target volume without unforeseen complications. Treatment was delivered uneventfully. The high doses associated with stereotactic body radiotherapy and the significant potential risks require careful treatment set up and patient monitoring constituting a special treatment procedure   STEREOTACTIC TREATMENT MANAGEMENT:  Following delivery, the patient was evaluated clinically. The patient tolerated treatment without significant acute effects, and was discharged to home in stable condition.    PLAN: Continue treatment as planned.  ________________________________  Blair Promise, PhD, MD

## 2019-03-18 ENCOUNTER — Ambulatory Visit: Payer: Medicare Other | Admitting: Radiation Oncology

## 2019-03-19 ENCOUNTER — Other Ambulatory Visit: Payer: Self-pay

## 2019-03-19 ENCOUNTER — Ambulatory Visit
Admission: RE | Admit: 2019-03-19 | Discharge: 2019-03-19 | Disposition: A | Discharge status: Home / Self Care | Payer: Medicare Other | Source: Ambulatory Visit | Attending: Radiation Oncology | Admitting: Radiation Oncology

## 2019-03-19 DIAGNOSIS — Z510 Encounter for antineoplastic radiation therapy: Secondary | ICD-10-CM | POA: Diagnosis not present

## 2019-03-19 DIAGNOSIS — C3411 Malignant neoplasm of upper lobe, right bronchus or lung: Secondary | ICD-10-CM | POA: Diagnosis not present

## 2019-03-19 DIAGNOSIS — C3491 Malignant neoplasm of unspecified part of right bronchus or lung: Secondary | ICD-10-CM

## 2019-03-19 NOTE — Progress Notes (Signed)
  Radiation Oncology         [phone] ________________________________  Name: [name] MRN: [id number]  Date: 03/19/2019  DOB: 21-May-1941  Stereotactic Body Radiotherapy Treatment Procedure Note  NARRATIVE:  REE ALCALDE was brought to the stereotactic radiation treatment machine and placed supine on the CT couch. The patient was set up for stereotactic body radiotherapy on the body fix pillow.  3D TREATMENT PLANNING AND DOSIMETRY:  The patient's radiation plan was reviewed and approved prior to starting treatment.  It showed 3-dimensional radiation distributions overlaid onto the planning CT.  The [facility] for the target structures as well as the organs at risk were reviewed. The documentation of this is filed in the radiation oncology EMR.  SIMULATION VERIFICATION:  The patient underwent CT imaging on the treatment unit.  These were carefully aligned to document that the ablative radiation dose would cover the target volume and maximally spare the nearby organs at risk according to the planned distribution.  SPECIAL TREATMENT PROCEDURE: [name] received high dose ablative stereotactic body radiotherapy to the planned target volume without unforeseen complications. Treatment was delivered uneventfully. The high doses associated with stereotactic body radiotherapy and the significant potential risks require careful treatment set up and patient monitoring constituting a special treatment procedure   STEREOTACTIC TREATMENT MANAGEMENT:  Following delivery, the patient was evaluated clinically. The patient tolerated treatment without significant acute effects, and was discharged to home in stable condition.    PLAN: Continue treatment as planned.  ________________________________  Blair Promise, PhD, MD  This document serves as a record of services personally performed by Gery Pray, MD. It was created on his behalf by Wilburn Mylar, a trained medical scribe. The creation  of this record is based on the scribe's personal observations and the provider's statements to them. This document has been checked and approved by the attending provider.

## 2019-03-20 ENCOUNTER — Other Ambulatory Visit: Payer: Self-pay | Admitting: Internal Medicine

## 2019-03-24 ENCOUNTER — Ambulatory Visit
Admission: RE | Admit: 2019-03-24 | Discharge: 2019-03-24 | Disposition: A | Discharge status: Home / Self Care | Payer: Medicare Other | Source: Ambulatory Visit | Attending: Radiation Oncology | Admitting: Radiation Oncology

## 2019-03-24 ENCOUNTER — Other Ambulatory Visit: Payer: Self-pay

## 2019-03-24 DIAGNOSIS — C3411 Malignant neoplasm of upper lobe, right bronchus or lung: Secondary | ICD-10-CM | POA: Diagnosis not present

## 2019-03-24 DIAGNOSIS — C3492 Malignant neoplasm of unspecified part of left bronchus or lung: Secondary | ICD-10-CM

## 2019-03-24 DIAGNOSIS — Z510 Encounter for antineoplastic radiation therapy: Secondary | ICD-10-CM | POA: Diagnosis not present

## 2019-03-24 NOTE — Progress Notes (Signed)
  Radiation Oncology         (336) [phone] ________________________________  Name: KRISTINA MCNORTON MRN: [id number]  Date: 03/24/2019  DOB: [birth year]  Stereotactic Body Radiotherapy Treatment Procedure Note  NARRATIVE:  KHUSHBOO CHUCK was brought to the stereotactic radiation treatment machine and placed supine on the CT couch. The patient was set up for stereotactic body radiotherapy on the body fix pillow.  3D TREATMENT PLANNING AND DOSIMETRY:  The patient's radiation plan was reviewed and approved prior to starting treatment.  It showed 3-dimensional radiation distributions overlaid onto the planning CT.  The [facility] for the target structures as well as the organs at risk were reviewed. The documentation of this is filed in the radiation oncology EMR.  SIMULATION VERIFICATION:  The patient underwent CT imaging on the treatment unit.  These were carefully aligned to document that the ablative radiation dose would cover the target volume and maximally spare the nearby organs at risk according to the planned distribution.  SPECIAL TREATMENT PROCEDURE: [name] received high dose ablative stereotactic body radiotherapy to the planned target volume without unforeseen complications. Treatment was delivered uneventfully. The high doses associated with stereotactic body radiotherapy and the significant potential risks require careful treatment set up and patient monitoring constituting a special treatment procedure   STEREOTACTIC TREATMENT MANAGEMENT:  Following delivery, the patient was evaluated clinically. The patient tolerated treatment without significant acute effects, and was discharged to home in stable condition.    PLAN: Continue treatment as planned.  ________________________________  Blair Promise, PhD, MD

## 2019-03-25 ENCOUNTER — Other Ambulatory Visit: Payer: Self-pay

## 2019-03-25 ENCOUNTER — Inpatient Hospital Stay (HOSPITAL_BASED_OUTPATIENT_CLINIC_OR_DEPARTMENT_OTHER): Payer: Medicare Other | Admitting: Physician Assistant

## 2019-03-25 ENCOUNTER — Inpatient Hospital Stay: Payer: Medicare Other | Attending: Internal Medicine

## 2019-03-25 ENCOUNTER — Inpatient Hospital Stay: Payer: Medicare Other

## 2019-03-25 VITALS — BP 128/60 | HR 82 | Temp 98.9°F | Resp 18 | Ht 66.0 in | Wt 141.0 lb

## 2019-03-25 DIAGNOSIS — Z5112 Encounter for antineoplastic immunotherapy: Secondary | ICD-10-CM

## 2019-03-25 DIAGNOSIS — Z95828 Presence of other vascular implants and grafts: Secondary | ICD-10-CM

## 2019-03-25 DIAGNOSIS — Z9221 Personal history of antineoplastic chemotherapy: Secondary | ICD-10-CM | POA: Diagnosis not present

## 2019-03-25 DIAGNOSIS — Z8673 Personal history of transient ischemic attack (TIA), and cerebral infarction without residual deficits: Secondary | ICD-10-CM | POA: Insufficient documentation

## 2019-03-25 DIAGNOSIS — C3491 Malignant neoplasm of unspecified part of right bronchus or lung: Secondary | ICD-10-CM | POA: Diagnosis not present

## 2019-03-25 DIAGNOSIS — E119 Type 2 diabetes mellitus without complications: Secondary | ICD-10-CM | POA: Insufficient documentation

## 2019-03-25 DIAGNOSIS — I10 Essential (primary) hypertension: Secondary | ICD-10-CM | POA: Diagnosis not present

## 2019-03-25 DIAGNOSIS — Z7984 Long term (current) use of oral hypoglycemic drugs: Secondary | ICD-10-CM | POA: Insufficient documentation

## 2019-03-25 DIAGNOSIS — Z87891 Personal history of nicotine dependence: Secondary | ICD-10-CM | POA: Insufficient documentation

## 2019-03-25 DIAGNOSIS — Z7982 Long term (current) use of aspirin: Secondary | ICD-10-CM | POA: Diagnosis not present

## 2019-03-25 DIAGNOSIS — E785 Hyperlipidemia, unspecified: Secondary | ICD-10-CM | POA: Diagnosis not present

## 2019-03-25 DIAGNOSIS — C3411 Malignant neoplasm of upper lobe, right bronchus or lung: Secondary | ICD-10-CM | POA: Insufficient documentation

## 2019-03-25 DIAGNOSIS — Z923 Personal history of irradiation: Secondary | ICD-10-CM | POA: Insufficient documentation

## 2019-03-25 DIAGNOSIS — Z79899 Other long term (current) drug therapy: Secondary | ICD-10-CM | POA: Diagnosis not present

## 2019-03-25 LAB — CBC WITH DIFFERENTIAL (CANCER CENTER ONLY)
Abs Immature Granulocytes: 0.04 10*3/uL (ref 0.00–0.07)
Basophils Absolute: 0 10*3/uL (ref 0.0–0.1)
Basophils Relative: 0 %
Eosinophils Absolute: 0.2 10*3/uL (ref 0.0–0.5)
Eosinophils Relative: 2 %
HCT: 35.9 % — ABNORMAL LOW (ref 36.0–46.0)
Hemoglobin: 11.5 g/dL — ABNORMAL LOW (ref 12.0–15.0)
Immature Granulocytes: 0 %
Lymphocytes Relative: 15 %
Lymphs Abs: 1.5 10*3/uL (ref 0.7–4.0)
MCH: 29.8 pg (ref 26.0–34.0)
MCHC: 32 g/dL (ref 30.0–36.0)
MCV: 93 fL (ref 80.0–100.0)
Monocytes Absolute: 0.7 10*3/uL (ref 0.1–1.0)
Monocytes Relative: 8 %
Neutro Abs: 7 10*3/uL (ref 1.7–7.7)
Neutrophils Relative %: 75 %
Platelet Count: 400 10*3/uL (ref 150–400)
RBC: 3.86 MIL/uL — ABNORMAL LOW (ref 3.87–5.11)
RDW: 13.5 % (ref 11.5–15.5)
WBC Count: 9.5 10*3/uL (ref 4.0–10.5)
nRBC: 0 % (ref 0.0–0.2)

## 2019-03-25 LAB — CMP (CANCER CENTER ONLY)
ALT: 7 U/L (ref 0–44)
AST: 14 U/L — ABNORMAL LOW (ref 15–41)
Albumin: 3.4 g/dL — ABNORMAL LOW (ref 3.5–5.0)
Alkaline Phosphatase: 58 U/L (ref 38–126)
Anion gap: 9 (ref 5–15)
BUN: 18 mg/dL (ref 8–23)
CO2: 27 mmol/L (ref 22–32)
Calcium: 9.2 mg/dL (ref 8.9–10.3)
Chloride: 104 mmol/L (ref 98–111)
Creatinine: 0.98 mg/dL (ref 0.44–1.00)
GFR, Est AFR Am: >60 mL/min (ref >60)
GFR, Estimated: 55 mL/min — ABNORMAL LOW (ref >60)
Glucose, Bld: 162 mg/dL — ABNORMAL HIGH (ref 70–99)
Potassium: 4 mmol/L (ref 3.5–5.1)
Sodium: 140 mmol/L (ref 135–145)
Total Bilirubin: 0.3 mg/dL (ref 0.3–1.2)
Total Protein: 6.7 g/dL (ref 6.5–8.1)

## 2019-03-25 LAB — TSH: TSH: 1.421 u[IU]/mL (ref 0.308–3.960)

## 2019-03-25 MED ORDER — SODIUM CHLORIDE 0.9% FLUSH
10.0000 mL | INTRAVENOUS | Status: DC | PRN
  Administered 2019-03-25: 13:00:00 10 mL
  Filled 2019-03-25: qty 10

## 2019-03-25 MED ORDER — SODIUM CHLORIDE 0.9 % IV SOLN
Freq: Once | INTRAVENOUS | Status: AC
  Administered 2019-03-25: 14:00:00 via INTRAVENOUS
  Filled 2019-03-25: qty 250

## 2019-03-25 MED ORDER — HEPARIN SOD (PORK) LOCK FLUSH 100 UNIT/ML IV SOLN
500.0000 [IU] | Freq: Once | INTRAVENOUS | Status: AC | PRN
  Administered 2019-03-25: 16:00:00 500 [IU]
  Filled 2019-03-25: qty 5

## 2019-03-25 MED ORDER — SODIUM CHLORIDE 0.9% FLUSH
10.0000 mL | INTRAVENOUS | Status: DC | PRN
  Administered 2019-03-25: 10 mL
  Filled 2019-03-25: qty 10

## 2019-03-25 MED ORDER — SODIUM CHLORIDE 0.9 % IV SOLN
10.2000 mg/kg | Freq: Once | INTRAVENOUS | Status: AC
  Administered 2019-03-25: 620 mg via INTRAVENOUS
  Filled 2019-03-25: qty 2.4

## 2019-03-25 NOTE — Patient Instructions (Signed)
Durvalumab injection What is this medicine? DURVALUMAB (dur VAL ue mab) is a monoclonal antibody. It is used to treat urothelial cancer and lung cancer. This medicine may be used for other purposes; ask your health care provider or pharmacist if you have questions. COMMON BRAND NAME(S): IMFINZI What should I tell my health care provider before I take this medicine? They need to know if you have any of these conditions:  diabetes  immune system problems  infection  inflammatory bowel disease  kidney disease  liver disease  lung or breathing disease  lupus  organ transplant  stomach or intestine problems  thyroid disease  an unusual or allergic reaction to durvalumab, other medicines, foods, dyes, or preservatives  pregnant or trying to get pregnant  breast-feeding How should I use this medicine? This medicine is for infusion into a vein. It is given by a health care professional in a hospital or clinic setting. A special MedGuide will be given to you before each treatment. Be sure to read this information carefully each time. Talk to your pediatrician regarding the use of this medicine in children. Special care may be needed. Overdosage: If you think you have taken too much of this medicine contact a poison control center or emergency room at once. NOTE: This medicine is only for you. Do not share this medicine with others. What if I miss a dose? It is important not to miss your dose. Call your doctor or health care professional if you are unable to keep an appointment. What may interact with this medicine? Interactions have not been studied. This list may not describe all possible interactions. Give your health care provider a list of all the medicines, herbs, non-prescription drugs, or dietary supplements you use. Also tell them if you smoke, drink alcohol, or use illegal drugs. Some items may interact with your medicine. What should I watch for while using this  medicine? This drug may make you feel generally unwell. Continue your course of treatment even though you feel ill unless your doctor tells you to stop. You may need blood work done while you are taking this medicine. Do not become pregnant while taking this medicine or for 3 months after stopping it. Women should inform their doctor if they wish to become pregnant or think they might be pregnant. There is a potential for serious side effects to an unborn child. Talk to your health care professional or pharmacist for more information. Do not breast-feed an infant while taking this medicine or for 3 months after stopping it. What side effects may I notice from receiving this medicine? Side effects that you should report to your doctor or health care professional as soon as possible:  allergic reactions like skin rash, itching or hives, swelling of the face, lips, or tongue  black, tarry stools  bloody or watery diarrhea  breathing problems  change in emotions or moods  change in sex drive  changes in vision  chest pain or chest tightness  chills  confusion  cough  facial flushing  fever  headache  signs and symptoms of high blood sugar such as dizziness; dry mouth; dry skin; fruity breath; nausea; stomach pain; increased hunger or thirst; increased urination  signs and symptoms of liver injury like dark yellow or brown urine; general ill feeling or flu-like symptoms; light-colored stools; loss of appetite; nausea; right upper belly pain; unusually weak or tired; yellowing of the eyes or skin  stomach pain  trouble passing urine or change in   the amount of urine  weight gain or weight loss Side effects that usually do not require medical attention (report these to your doctor or health care professional if they continue or are bothersome):  bone pain  constipation  loss of appetite  muscle pain  nausea  swelling of the ankles, feet, hands  tiredness This list  may not describe all possible side effects. Call your doctor for medical advice about side effects. You may report side effects to FDA at 1-800-FDA-1088. Where should I keep my medicine? This drug is given in a hospital or clinic and will not be stored at home. NOTE: This sheet is a summary. It may not cover all possible information. If you have questions about this medicine, talk to your doctor, pharmacist, or health care provider.  2020 Elsevier/Gold Standard (2016-09-11 19:25:04)  Coronavirus (COVID-19) Are you at risk?  Are you at risk for the Coronavirus (COVID-19)?  To be considered HIGH RISK for Coronavirus (COVID-19), you have to meet the following criteria:  . Traveled to [place], [place], [place], [place] or [place]; or in the [place] to [place], [place], [place], or [place]; and have fever, cough, and shortness of breath within the last 2 weeks of travel OR . Been in close contact with a person diagnosed with COVID-19 within the last 2 weeks and have fever, cough, and shortness of breath . IF YOU DO NOT MEET THESE CRITERIA, YOU ARE CONSIDERED LOW RISK FOR COVID-19.  What to do if you are HIGH RISK for COVID-19?  . If you are having a medical emergency, call 911. . Seek medical care right away. Before you go to a doctor's office, urgent care or emergency department, call ahead and tell them about your recent travel, contact with someone diagnosed with COVID-19, and your symptoms. You should receive instructions from your physician's office regarding next steps of care.  . When you arrive at healthcare provider, tell the healthcare staff immediately you have returned from visiting [place], [place], [place], [place] or [place]; or traveled in the [place] to [place], [place], [place], or [place]; in the last two weeks or you have been in close contact with a person diagnosed with COVID-19 in the last 2 weeks.   . Tell the health care staff about your symptoms:  fever, cough and shortness of breath. . After you have been seen by a medical provider, you will be either: o Tested for (COVID-19) and discharged home on quarantine except to seek medical care if symptoms worsen, and asked to  - Stay home and avoid contact with others until you get your results (4-5 days)  - Avoid travel on public transportation if possible (such as bus, train, or airplane) or o Sent to the Emergency Department by EMS for evaluation, COVID-19 testing, and possible admission depending on your condition and test results.  What to do if you are LOW RISK for COVID-19?  Reduce your risk of any infection by using the same precautions used for avoiding the common cold or flu:  . Wash your hands often with soap and warm water for at least 20 seconds.  If soap and water are not readily available, use an alcohol-based hand sanitizer with at least 60% alcohol.  . If coughing or sneezing, cover your mouth and nose by coughing or sneezing into the elbow areas of your shirt or coat, into a tissue or into your sleeve (not your hands). . Avoid shaking hands with others   and consider head nods or verbal greetings only. . Avoid touching your eyes, nose, or mouth with unwashed hands.  . Avoid close contact with people who are sick. . Avoid places or events with large numbers of people in one location, like concerts or sporting events. . Carefully consider travel plans you have or are making. . If you are planning any travel outside or inside the [place], visit the CDC's Travelers' Health webpage for the latest health notices. . If you have some symptoms but not all symptoms, continue to monitor at home and seek medical attention if your symptoms worsen. . If you are having a medical emergency, call 911.   ADDITIONAL HEALTHCARE OPTIONS FOR PATIENTS  [facility] / e-Visit: [email or web address]         [facility]: [phone]  [facility]: [phone]                   [facility]: [phone]   

## 2019-03-25 NOTE — Progress Notes (Signed)
[facility] [place] OFFICE PROGRESS NOTE  [name], [place] [address]  DIAGNOSIS:  Recurrent non-small cell lung cancer likely squamous cell carcinoma that was initially diagnosed as a stage IIA(T2b, N0, M0) inAugust 2018status post curative stereotactic radiotherapy completed June 21, 2017. The patient presented today with concerning findings for disease recurrence and progression with enlarging and hypermetabolic right upper lobe lung mass presenting as stage IIIa (T3, N0, M0).  PDL 1 expression 0%.  PRIOR THERAPY: 1) Curative stereotactic radiotherapy completed on June 21, 2017. 2) A course of concurrent chemoradiation with chemotherapy consisting of carboplatin for an AUC of 2 and paclitaxel 45 mg/m.First dose started on 06/23/2018.Status post 7 cycles. Last dose of chemotherapy was given August 04, 2018.  CURRENT THERAPY:  1) Consolidation immunotherapy with Imfinzi 10 mg/KG every 2 weeks. First dose September 09, 2018. Status post14cycles. 2) SBRT to the enlarging left lower lobe pulmonary nodule under the care of Dr. Sondra Come. Last radiation treatment scheduled for 03/26/2019  INTERVAL HISTORY: [name] 78 y.o. female returns to the clinic for a follow-up visit.  The patient is feeling fairly well today without any concerning complaints except for intermittent discomfort near her Port-A-Cath site.  The patient denies any redness, swelling, purulent drainage near the site.  Patient does not take any medications for her discomfort. She is unable to characterize her discomfort or note any provoking factors for her pain.   The patient continues to tolerate her treatment with immunotherapy well without any adverse side effects except for fatigue. She is currently undergoing SBRT treatment to the enlarging left lower lobe pulmonary nodule under the care of Dr. Sondra Come.  This nodule can represent metastatic disease or  metasynchronous primary.  She is tolerating her radiation treatment well and she has 1 treatment left scheduled for tomorrow.  She denies any fever, chills, night sweats, or weight loss.  She denies any cough, shortness of breath, or hemoptysis.  She denies any nausea, vomiting, diarrhea, or constipation.  She denies any headache or visual changes.  She denies any rashes or skin changes.  She is here today for evaluation before starting cycle #15 today as scheduled.  MEDICAL HISTORY: Past Medical History:  Diagnosis Date  . Cancer ([place])    Lung   . Carotid artery occlusion   . Clostridium difficile infection 06/2013  . Diabetes mellitus    takes Janumet daily  . Dyslipidemia    takes Crestor daily  . Gallstones   . GERD (gastroesophageal reflux disease)   . Heart murmur    hx of  . History of radiation therapy 06/11/17-06/21/17   right lung 50 Gy in 5 fractions  . Hypertension    takes Prinzide and Verapamil daily  . Impaired speech    from stroke  . Neuropathy, diabetic ([place])   . Pneumonia   . PONV (postoperative nausea and vomiting)   . Seizures ([place]) 04/19/2018   recent hospitalization  . Smoker   . Stroke ([place]) 06/25/13  . Vertigo    but doesn't take any meds    ALLERGIES:  is allergic to codeine; benadryl [diphenhydramine]; lipitor [atorvastatin]; and phenergan [promethazine hcl].  MEDICATIONS:  Current Outpatient Medications  Medication Sig Dispense Refill  . aspirin EC 81 MG tablet Take 81 mg by mouth daily.    . Blood Glucose Monitoring Suppl (ONE TOUCH ULTRA 2) w/Device KIT 1 Device by Does not apply route 2 (two) times daily. 1 each 0  . clopidogrel (  PLAVIX) 75 MG tablet Take 1 tablet (75 mg total) by mouth daily. 90 tablet 3  . divalproex (DEPAKOTE ER) 500 MG 24 hr tablet Take one tablet daily 30 tablet 6  . glucose blood (ONE TOUCH ULTRA TEST) test strip Use as instructed 100 each 12  . Lancets (ONETOUCH ULTRASOFT) lancets Use as instructed 100 each 12  .  lidocaine-prilocaine (EMLA) cream APPLY TO THE AFFECTED AREA DAILY AS NEEDED 30 g 0  . lisinopril (PRINIVIL,ZESTRIL) 10 MG tablet Take 1 tablet (10 mg total) by mouth daily. 90 tablet 3  . Multiple Vitamins-Minerals (MULTIVITAMIN PO) Take 1 tablet by mouth daily.    . pioglitazone (ACTOS) 30 MG tablet Take 1 tablet (30 mg total) by mouth daily. 90 tablet 1  . prochlorperazine (COMPAZINE) 10 MG tablet Take 1 tablet (10 mg total) by mouth every 6 (six) hours as needed for nausea or vomiting. 30 tablet 0  . rosuvastatin (CRESTOR) 20 MG tablet TAKE 1 TABLET BY MOUTH ONCE DAILY (Patient taking differently: Take 20 mg by mouth daily. ) 180 tablet 0  . SitaGLIPtin-MetFORMIN HCl (JANUMET XR) 50-1000 MG TB24 Take 1 tablet by mouth daily. 60 tablet 5  . vitamin B-12 (CYANOCOBALAMIN) 1000 MCG tablet Take 1,000 mcg by mouth daily.     No current facility-administered medications for this visit.    Facility-Administered Medications Ordered in Other Visits  Medication Dose Route Frequency Provider Last Rate Last Dose  . durvalumab (IMFINZI) 620 mg in sodium chloride 0.9 % 100 mL chemo infusion  10.2 mg/kg (Treatment Plan Recorded) Intravenous Once Curt Bears, MD      . heparin lock flush 100 unit/mL  500 Units Intracatheter Once PRN Curt Bears, MD      . sodium chloride flush (NS) 0.9 % injection 10 mL  10 mL Intracatheter PRN Curt Bears, MD        SURGICAL HISTORY:  Past Surgical History:  Procedure Laterality Date  . cataract removed Right   . CHOLECYSTECTOMY    . COLONOSCOPY    . ENDARTERECTOMY Left 07/14/2013   Procedure: ENDARTERECTOMY CAROTID-LEFT;  Surgeon: Elam Dutch, MD;  Location: [facility];  Service: Vascular;  Laterality: Left;  . EYE SURGERY Left    cataract  . IR IMAGING GUIDED PORT INSERTION  09/15/2018  . IR PERC PLEURAL DRAIN W/INDWELL CATH W/IMG GUIDE  05/23/2017  . KNEE SURGERY Left 41yr ago  . LAPAROSCOPIC PARTIAL COLECTOMY N/A 09/02/2015   Procedure:  LAPAROSCOPIC PARTIAL RIGHT COLECTOMY;  Surgeon: ALeighton Ruff MD;  [facility];  Service: General;  Laterality: N/A;  . PATCH ANGIOPLASTY Left 07/14/2013   Procedure: LEFT CAROTID ARTERY PATCH ANGIOPLASTY;  Surgeon: CElam Dutch MD;  [facility]  Service: Vascular;  Laterality: Left;  .Marland KitchenVIDEO BRONCHOSCOPY N/A 06/04/2018   Procedure: VIDEO BRONCHOSCOPY;  Surgeon: HMelrose Nakayama MD;  [facility];  Service: Thoracic;  Laterality: N/A;  . wisdom      REVIEW OF SYSTEMS:   Review of Systems  Constitutional: Positive for fatigue. Negative for appetite change, chills, fever and unexpected weight change.  HENT: Negative for mouth sores, nosebleeds, sore throat and trouble swallowing.   Eyes: Negative for eye problems and icterus.  Respiratory: Negative for cough, hemoptysis, shortness of breath and wheezing.   Cardiovascular: Positive for occasional chest discomfort on the right side of her chest near her port-a-cath. Negative for leg swelling.  Gastrointestinal: Negative for abdominal pain, constipation, diarrhea, nausea and vomiting.  Genitourinary: Negative for bladder  incontinence, difficulty urinating, dysuria, frequency and hematuria.   Musculoskeletal: Negative for back pain, gait problem, neck pain and neck stiffness.  Skin: Negative for itching and rash.  Neurological: Negative for dizziness, extremity weakness, gait problem, headaches, light-headedness and seizures.  Hematological: Negative for adenopathy. Does not bruise/bleed easily.  Psychiatric/Behavioral: Negative for confusion, depression and sleep disturbance. The patient is not nervous/anxious.     PHYSICAL EXAMINATION:  Blood pressure 128/60, pulse 82, temperature 98.9 F (37.2 C), resp. rate 18, height '5\' 6"'  (1.676 m), weight 141 lb (64 kg), SpO2 100 %.  ECOG PERFORMANCE STATUS: 1 - Symptomatic but completely ambulatory  Physical Exam  Constitutional: Oriented to person, place, and time and  well-developed, well-nourished, and in no distress. No distress.  HENT:  Head: Normocephalic and atraumatic.  Mouth/Throat: Oropharynx is clear and moist. No oropharyngeal exudate.  Eyes: Conjunctivae are normal. Right eye exhibits no discharge. Left eye exhibits no discharge. No scleral icterus.  Neck: Normal range of motion. Neck supple.  Cardiovascular: Normal rate, regular rhythm, normal heart sounds and intact distal pulses.   Pulmonary/Chest: Effort normal and breath sounds normal. No respiratory distress. No wheezes. No rales.  Abdominal: Soft. Bowel sounds are normal. Exhibits no distension and no mass. There is no tenderness.  Musculoskeletal: Normal range of motion. Exhibits no edema.  Lymphadenopathy:    No cervical adenopathy.  Neurological: Alert and oriented to person, place, and time. Exhibits normal muscle tone. Gait normal. Coordination normal.  Skin: Skin is warm and dry. No rash noted. Not diaphoretic. No erythema. No pallor.  Psychiatric: Mood, memory and judgment normal.  Vitals reviewed.  LABORATORY DATA: Lab Results  Component Value Date   WBC 9.5 03/25/2019   HGB 11.5 (L) 03/25/2019   HCT 35.9 (L) 03/25/2019   MCV 93.0 03/25/2019   PLT 400 03/25/2019      Chemistry      Component Value Date/Time   NA 140 03/25/2019 1232   NA 139 05/07/2018 1147   K 4.0 03/25/2019 1232   CL 104 03/25/2019 1232   CO2 27 03/25/2019 1232   BUN 18 03/25/2019 1232   BUN 15 05/07/2018 1147   CREATININE 0.98 03/25/2019 1232   CREATININE 0.87 03/08/2015 0001      Component Value Date/Time   CALCIUM 9.2 03/25/2019 1232   ALKPHOS 58 03/25/2019 1232   AST 14 (L) 03/25/2019 1232   ALT 7 03/25/2019 1232   BILITOT 0.3 03/25/2019 1232       RADIOGRAPHIC STUDIES:  No results found.   ASSESSMENT/PLAN:  This is a very pleasant 78 year old Caucasian female with recurrentstage IIIAnon-small cell lung cancer, squamous cell carcinomaof the right upper lobe. She presented  initially as a stage IIa in August of 2018.  The patient underwent a course of concurrent chemoradiation with weekly carboplatin and paclitaxel. She status post 7 cycles. She tolerated treatment well.  She is currently undergoing consolidation immunotherapy with Imfinzi 10 mg/kg IV every 2 weeks. She is status post 14 cycles. She continues to tolerate her treatment well without any adverse side effects.   She is currently undergoing SBRT to the enlarging left lower lung pulmonary nodule under the care of Dr. Sondra Come. Her last radiation treatment is scheduled for 03/26/2019.   Labs were reviewed with the patient today. I recommend that she proceed with cycle #15today as scheduled.  I will see her back for follow-up visit in 2 weeks for evaluation before starting cycle #16.  The patient was advised to call immediately  if she has any concerning symptoms in the interval. The patient voices understanding of current disease status and treatment options and is in agreement with the current care plan. All questions were answered. The patient knows to call the clinic with any problems, questions or concerns. We can certainly see the patient much sooner if necessary   No orders of the defined types were placed in this encounter.    Eli Adami L Bianka Liberati, PA-C 03/25/19

## 2019-03-26 ENCOUNTER — Encounter: Payer: Self-pay | Admitting: Radiation Oncology

## 2019-03-26 ENCOUNTER — Telehealth: Payer: Self-pay | Admitting: Internal Medicine

## 2019-03-26 ENCOUNTER — Ambulatory Visit
Admission: RE | Admit: 2019-03-26 | Discharge: 2019-03-26 | Disposition: A | Discharge status: Home / Self Care | Payer: Medicare Other | Source: Ambulatory Visit | Attending: Radiation Oncology | Admitting: Radiation Oncology

## 2019-03-26 ENCOUNTER — Other Ambulatory Visit: Payer: Self-pay

## 2019-03-26 DIAGNOSIS — Z510 Encounter for antineoplastic radiation therapy: Secondary | ICD-10-CM | POA: Diagnosis not present

## 2019-03-26 DIAGNOSIS — C3491 Malignant neoplasm of unspecified part of right bronchus or lung: Secondary | ICD-10-CM

## 2019-03-26 DIAGNOSIS — C3432 Malignant neoplasm of lower lobe, left bronchus or lung: Secondary | ICD-10-CM | POA: Diagnosis not present

## 2019-03-26 DIAGNOSIS — C3411 Malignant neoplasm of upper lobe, right bronchus or lung: Secondary | ICD-10-CM | POA: Diagnosis not present

## 2019-03-26 NOTE — Telephone Encounter (Signed)
Scheduled appt per 9/09 los - pt to get an updated schedule next visit.

## 2019-03-26 NOTE — Progress Notes (Signed)
  Radiation Oncology         [phone] ________________________________  Name: [name] MRN: [id number]  Date: 03/26/2019  DOB: 1940/09/24  Stereotactic Body Radiotherapy Treatment Procedure Note  NARRATIVE:  [name] was brought to the stereotactic radiation treatment machine and placed supine on the CT couch. The patient was set up for stereotactic body radiotherapy on the body fix pillow.  3D TREATMENT PLANNING AND DOSIMETRY:  The patient's radiation plan was reviewed and approved prior to starting treatment.  It showed 3-dimensional radiation distributions overlaid onto the planning CT.  The [facility] for the target structures as well as the organs at risk were reviewed. The documentation of this is filed in the radiation oncology EMR.  SIMULATION VERIFICATION:  The patient underwent CT imaging on the treatment unit.  These were carefully aligned to document that the ablative radiation dose would cover the target volume and maximally spare the nearby organs at risk according to the planned distribution.  SPECIAL TREATMENT PROCEDURE: [name] received high dose ablative stereotactic body radiotherapy to the planned target volume without unforeseen complications. Treatment was delivered uneventfully. The high doses associated with stereotactic body radiotherapy and the significant potential risks require careful treatment set up and patient monitoring constituting a special treatment procedure   STEREOTACTIC TREATMENT MANAGEMENT:  Following delivery, the patient was evaluated clinically. The patient tolerated treatment without significant acute effects, and was discharged to home in stable condition.    PLAN: Continue treatment as planned.  ________________________________  Blair Promise, PhD, MD

## 2019-04-07 ENCOUNTER — Other Ambulatory Visit (INDEPENDENT_AMBULATORY_CARE_PROVIDER_SITE_OTHER): Payer: Medicare Other

## 2019-04-07 ENCOUNTER — Inpatient Hospital Stay: Payer: Medicare Other

## 2019-04-07 ENCOUNTER — Other Ambulatory Visit: Payer: Self-pay

## 2019-04-07 ENCOUNTER — Inpatient Hospital Stay (HOSPITAL_BASED_OUTPATIENT_CLINIC_OR_DEPARTMENT_OTHER): Payer: Medicare Other | Admitting: Internal Medicine

## 2019-04-07 ENCOUNTER — Encounter: Payer: Self-pay | Admitting: Internal Medicine

## 2019-04-07 VITALS — BP 140/58 | HR 97 | Temp 97.8°F | Resp 17 | Ht 66.0 in | Wt 144.2 lb

## 2019-04-07 DIAGNOSIS — Z7984 Long term (current) use of oral hypoglycemic drugs: Secondary | ICD-10-CM | POA: Diagnosis not present

## 2019-04-07 DIAGNOSIS — Z8673 Personal history of transient ischemic attack (TIA), and cerebral infarction without residual deficits: Secondary | ICD-10-CM | POA: Diagnosis not present

## 2019-04-07 DIAGNOSIS — C3492 Malignant neoplasm of unspecified part of left bronchus or lung: Secondary | ICD-10-CM

## 2019-04-07 DIAGNOSIS — E785 Hyperlipidemia, unspecified: Secondary | ICD-10-CM | POA: Diagnosis not present

## 2019-04-07 DIAGNOSIS — C3411 Malignant neoplasm of upper lobe, right bronchus or lung: Secondary | ICD-10-CM | POA: Diagnosis not present

## 2019-04-07 DIAGNOSIS — Z23 Encounter for immunization: Secondary | ICD-10-CM | POA: Diagnosis not present

## 2019-04-07 DIAGNOSIS — E538 Deficiency of other specified B group vitamins: Secondary | ICD-10-CM | POA: Diagnosis not present

## 2019-04-07 DIAGNOSIS — Z9221 Personal history of antineoplastic chemotherapy: Secondary | ICD-10-CM | POA: Diagnosis not present

## 2019-04-07 DIAGNOSIS — C3491 Malignant neoplasm of unspecified part of right bronchus or lung: Secondary | ICD-10-CM

## 2019-04-07 DIAGNOSIS — Z7982 Long term (current) use of aspirin: Secondary | ICD-10-CM | POA: Diagnosis not present

## 2019-04-07 DIAGNOSIS — Z95828 Presence of other vascular implants and grafts: Secondary | ICD-10-CM

## 2019-04-07 DIAGNOSIS — Z87891 Personal history of nicotine dependence: Secondary | ICD-10-CM | POA: Diagnosis not present

## 2019-04-07 DIAGNOSIS — E119 Type 2 diabetes mellitus without complications: Secondary | ICD-10-CM | POA: Diagnosis not present

## 2019-04-07 DIAGNOSIS — Z79899 Other long term (current) drug therapy: Secondary | ICD-10-CM | POA: Diagnosis not present

## 2019-04-07 DIAGNOSIS — I10 Essential (primary) hypertension: Secondary | ICD-10-CM | POA: Diagnosis not present

## 2019-04-07 DIAGNOSIS — Z923 Personal history of irradiation: Secondary | ICD-10-CM | POA: Diagnosis not present

## 2019-04-07 DIAGNOSIS — Z5112 Encounter for antineoplastic immunotherapy: Secondary | ICD-10-CM | POA: Diagnosis not present

## 2019-04-07 LAB — CBC WITH DIFFERENTIAL (CANCER CENTER ONLY)
Abs Immature Granulocytes: 0.05 10*3/uL (ref 0.00–0.07)
Basophils Absolute: 0 10*3/uL (ref 0.0–0.1)
Basophils Relative: 0 %
Eosinophils Absolute: 0.3 10*3/uL (ref 0.0–0.5)
Eosinophils Relative: 3 %
HCT: 33.8 % — ABNORMAL LOW (ref 36.0–46.0)
Hemoglobin: 11 g/dL — ABNORMAL LOW (ref 12.0–15.0)
Immature Granulocytes: 1 %
Lymphocytes Relative: 13 %
Lymphs Abs: 1.2 10*3/uL (ref 0.7–4.0)
MCH: 30.1 pg (ref 26.0–34.0)
MCHC: 32.5 g/dL (ref 30.0–36.0)
MCV: 92.6 fL (ref 80.0–100.0)
Monocytes Absolute: 0.8 10*3/uL (ref 0.1–1.0)
Monocytes Relative: 8 %
Neutro Abs: 7 10*3/uL (ref 1.7–7.7)
Neutrophils Relative %: 75 %
Platelet Count: 388 10*3/uL (ref 150–400)
RBC: 3.65 MIL/uL — ABNORMAL LOW (ref 3.87–5.11)
RDW: 13.7 % (ref 11.5–15.5)
WBC Count: 9.4 10*3/uL (ref 4.0–10.5)
nRBC: 0 % (ref 0.0–0.2)

## 2019-04-07 LAB — CMP (CANCER CENTER ONLY)
ALT: 6 U/L (ref 0–44)
AST: 12 U/L — ABNORMAL LOW (ref 15–41)
Albumin: 3.1 g/dL — ABNORMAL LOW (ref 3.5–5.0)
Alkaline Phosphatase: 57 U/L (ref 38–126)
Anion gap: 12 (ref 5–15)
BUN: 20 mg/dL (ref 8–23)
CO2: 26 mmol/L (ref 22–32)
Calcium: 9.3 mg/dL (ref 8.9–10.3)
Chloride: 102 mmol/L (ref 98–111)
Creatinine: 0.99 mg/dL (ref 0.44–1.00)
GFR, Est AFR Am: >60 mL/min (ref >60)
GFR, Estimated: 55 mL/min — ABNORMAL LOW (ref >60)
Glucose, Bld: 264 mg/dL — ABNORMAL HIGH (ref 70–99)
Potassium: 3.9 mmol/L (ref 3.5–5.1)
Sodium: 140 mmol/L (ref 135–145)
Total Bilirubin: 0.4 mg/dL (ref 0.3–1.2)
Total Protein: 6.4 g/dL — ABNORMAL LOW (ref 6.5–8.1)

## 2019-04-07 MED ORDER — SODIUM CHLORIDE 0.9% FLUSH
10.0000 mL | INTRAVENOUS | Status: DC | PRN
  Administered 2019-04-07: 13:00:00 10 mL
  Filled 2019-04-07: qty 10

## 2019-04-07 MED ORDER — SODIUM CHLORIDE 0.9% FLUSH
10.0000 mL | INTRAVENOUS | Status: DC | PRN
  Administered 2019-04-07: 17:00:00 10 mL
  Filled 2019-04-07: qty 10

## 2019-04-07 MED ORDER — SODIUM CHLORIDE 0.9 % IV SOLN
Freq: Once | INTRAVENOUS | Status: AC
  Administered 2019-04-07: 15:00:00 via INTRAVENOUS
  Filled 2019-04-07: qty 250

## 2019-04-07 MED ORDER — CYANOCOBALAMIN 1000 MCG/ML IJ SOLN
1000.0000 ug | Freq: Once | INTRAMUSCULAR | Status: AC
  Administered 2019-04-07: 1000 ug via INTRAMUSCULAR

## 2019-04-07 MED ORDER — SODIUM CHLORIDE 0.9 % IV SOLN
10.2000 mg/kg | Freq: Once | INTRAVENOUS | Status: AC
  Administered 2019-04-07: 620 mg via INTRAVENOUS
  Filled 2019-04-07: qty 2.4

## 2019-04-07 MED ORDER — HEPARIN SOD (PORK) LOCK FLUSH 100 UNIT/ML IV SOLN
500.0000 [IU] | Freq: Once | INTRAVENOUS | Status: AC | PRN
  Administered 2019-04-07: 17:00:00 500 [IU]
  Filled 2019-04-07: qty 5

## 2019-04-07 NOTE — Progress Notes (Signed)
[facility] [phone]   Fax:([phone]  OFFICE PROGRESS NOTE  [name], [place] [address]  DIAGNOSIS:Recurrent non-small cell lung cancer likely squamous cell carcinoma that was initially diagnosed as a stage IIA(T2b, N0, M0) inAugust 2018status post curative stereotactic radiotherapy completed June 21, 2017. The patient presented today with concerning findings for disease recurrence and progression with enlarging and hypermetabolic right upper lobe lung mass presenting as stage IIIa (T3, N0, M0).  PDL 1 expression 0%.  PRIOR THERAPY: 1) Curative stereotactic radiotherapy completed on June 21, 2017. 2) A course of concurrent chemoradiation with chemotherapy consisting of carboplatin for an AUC of 2 and paclitaxel 45 mg/m.  First dose started on 06/23/2018.  Status post 7 cycles.  Last dose of chemotherapy was given August 04, 2018.  CURRENT THERAPY:Consolidation immunotherapy with Imfinzi 10 mg/KG every 2 weeks.  First dose September 09, 2018.  Status post 15 cycles.  INTERVAL HISTORY: [name] 78 y.o. female returns to the clinic today for follow-up visit.  The patient is feeling fine today with no concerning complaints.  She denied having any chest pain, shortness of breath, cough or hemoptysis.  She has no skin rash or itching.  She denied having any nausea, vomiting, diarrhea or constipation.  She has no headache or visual changes.  She has been tolerating her treatment with Imfinzi fairly well.  She is here today for evaluation before starting cycle #16.  MEDICAL HISTORY: Past Medical History:  Diagnosis Date  . Cancer ([place])    Lung   . Carotid artery occlusion   . Clostridium difficile infection 06/2013  . Diabetes mellitus    takes Janumet daily  . Dyslipidemia    takes Crestor daily  . Gallstones   . GERD (gastroesophageal reflux disease)   . Heart murmur    hx of  . History  of radiation therapy 06/11/17-06/21/17   right lung 50 Gy in 5 fractions  . Hypertension    takes Prinzide and Verapamil daily  . Impaired speech    from stroke  . Neuropathy, diabetic ([place])   . Pneumonia   . PONV (postoperative nausea and vomiting)   . Seizures ([place]) 04/19/2018   recent hospitalization  . Smoker   . Stroke ([place]) 06/25/13  . Vertigo    but doesn't take any meds    ALLERGIES:  is allergic to codeine; benadryl [diphenhydramine]; lipitor [atorvastatin]; and phenergan [promethazine hcl].  MEDICATIONS:  Current Outpatient Medications  Medication Sig Dispense Refill  . aspirin EC 81 MG tablet Take 81 mg by mouth daily.    . Blood Glucose Monitoring Suppl (ONE TOUCH ULTRA 2) w/Device KIT 1 Device by Does not apply route 2 (two) times daily. 1 each 0  . clopidogrel (PLAVIX) 75 MG tablet Take 1 tablet (75 mg total) by mouth daily. 90 tablet 3  . divalproex (DEPAKOTE ER) 500 MG 24 hr tablet Take one tablet daily 30 tablet 6  . glucose blood (ONE TOUCH ULTRA TEST) test strip Use as instructed 100 each 12  . Lancets (ONETOUCH ULTRASOFT) lancets Use as instructed 100 each 12  . lidocaine-prilocaine (EMLA) cream APPLY TO THE AFFECTED AREA DAILY AS NEEDED 30 g 0  . lisinopril (PRINIVIL,ZESTRIL) 10 MG tablet Take 1 tablet (10 mg total) by mouth daily. 90 tablet 3  . Multiple Vitamins-Minerals (MULTIVITAMIN PO) Take 1 tablet by mouth daily.    . pioglitazone (ACTOS) 30 MG tablet Take 1  tablet (30 mg total) by mouth daily. 90 tablet 1  . prochlorperazine (COMPAZINE) 10 MG tablet Take 1 tablet (10 mg total) by mouth every 6 (six) hours as needed for nausea or vomiting. 30 tablet 0  . rosuvastatin (CRESTOR) 20 MG tablet TAKE 1 TABLET BY MOUTH ONCE DAILY (Patient taking differently: Take 20 mg by mouth daily. ) 180 tablet 0  . SitaGLIPtin-MetFORMIN HCl (JANUMET XR) 50-1000 MG TB24 Take 1 tablet by mouth daily. 60 tablet 5  . vitamin B-12 (CYANOCOBALAMIN) 1000 MCG tablet Take 1,000 mcg  by mouth daily.     No current facility-administered medications for this visit.     SURGICAL HISTORY:  Past Surgical History:  Procedure Laterality Date  . cataract removed Right   . CHOLECYSTECTOMY    . COLONOSCOPY    . ENDARTERECTOMY Left 07/14/2013   Procedure: ENDARTERECTOMY CAROTID-LEFT;  Surgeon: Elam Dutch, MD;  Location: [facility];  Service: Vascular;  Laterality: Left;  . EYE SURGERY Left    cataract  . IR IMAGING GUIDED PORT INSERTION  09/15/2018  . IR PERC PLEURAL DRAIN W/INDWELL CATH W/IMG GUIDE  05/23/2017  . KNEE SURGERY Left 30yr ago  . LAPAROSCOPIC PARTIAL COLECTOMY N/A 09/02/2015   Procedure: LAPAROSCOPIC PARTIAL RIGHT COLECTOMY;  Surgeon: ALeighton Ruff MD;  [facility];  Service: General;  Laterality: N/A;  . PATCH ANGIOPLASTY Left 07/14/2013   Procedure: LEFT CAROTID ARTERY PATCH ANGIOPLASTY;  Surgeon: CElam Dutch MD;  [facility]  Service: Vascular;  Laterality: Left;  .Marland KitchenVIDEO BRONCHOSCOPY N/A 06/04/2018   Procedure: VIDEO BRONCHOSCOPY;  Surgeon: HMelrose Nakayama MD;  [facility];  Service: Thoracic;  Laterality: N/A;  . wisdom      REVIEW OF SYSTEMS:  A comprehensive review of systems was negative.   PHYSICAL EXAMINATION: General appearance: alert, cooperative and no distress Head: Normocephalic, without obvious abnormality, atraumatic Neck: no adenopathy, no JVD, supple, symmetrical, trachea midline and thyroid not enlarged, symmetric, no tenderness/mass/nodules Lymph nodes: Cervical, supraclavicular, and axillary nodes normal. Resp: clear to auscultation bilaterally Back: symmetric, no curvature. ROM normal. No CVA tenderness. Cardio: regular rate and rhythm, S1, S2 normal, no murmur, click, rub or gallop GI: soft, non-tender; bowel sounds normal; no masses,  no organomegaly Extremities: extremities normal, atraumatic, no cyanosis or edema  ECOG PERFORMANCE STATUS: 1 - Symptomatic but completely ambulatory  Blood pressure (!)  140/58, pulse 97, temperature 97.8 F (36.6 C), temperature source Temporal, resp. rate 17, height '5\' 6"'  (1.676 m), weight 144 lb 3.2 oz (65.4 kg), SpO2 98 %.  LABORATORY DATA: Lab Results  Component Value Date   WBC 9.4 04/07/2019   HGB 11.0 (L) 04/07/2019   HCT 33.8 (L) 04/07/2019   MCV 92.6 04/07/2019   PLT 388 04/07/2019      Chemistry      Component Value Date/Time   NA 140 03/25/2019 1232   NA 139 05/07/2018 1147   K 4.0 03/25/2019 1232   CL 104 03/25/2019 1232   CO2 27 03/25/2019 1232   BUN 18 03/25/2019 1232   BUN 15 05/07/2018 1147   CREATININE 0.98 03/25/2019 1232   CREATININE 0.87 03/08/2015 0001      Component Value Date/Time   CALCIUM 9.2 03/25/2019 1232   ALKPHOS 58 03/25/2019 1232   AST 14 (L) 03/25/2019 1232   ALT 7 03/25/2019 1232   BILITOT 0.3 03/25/2019 1232       RADIOGRAPHIC STUDIES: No results found.  ASSESSMENT AND PLAN: This is a  very pleasant 78 years old white female with recurrent non-small cell lung cancer presented as a stage IIIa involving the right lower lobe. The patient underwent a course of concurrent chemoradiation with weekly carboplatin and paclitaxel status post 7 cycles.  She tolerated this treatment well. She had repeat CT scan of the chest performed recently.  I personally and independently reviewed the scan images and discussed the results with the patient and her daughter. Her scan showed improvement of the right lung mass with no other concerning findings for progression. The patient was started on treatment with consolidation immunotherapy with Imfinzi status post 15 cycles.   She continues to tolerate her treatment well with no concerning complaints. I recommended for her to proceed with cycle #16 today. I will see her back for follow-up visit in 2 weeks for evaluation before the next cycle of her treatment. For hypertension, she will continue to take her blood pressure medication as prescribed. She was advised to call  immediately if she has any concerning symptoms in the interval. The patient voices understanding of current disease status and treatment options and is in agreement with the current care plan. All questions were answered. The patient knows to call the clinic with any problems, questions or concerns. We can certainly see the patient much sooner if necessary.  Disclaimer: This note was dictated with voice recognition software. Similar sounding words can inadvertently be transcribed and may not be corrected upon review.

## 2019-04-07 NOTE — Addendum Note (Signed)
Addended by: Edgar Frisk on: 04/07/2019 01:42 PM   Modules accepted: Orders

## 2019-04-13 ENCOUNTER — Emergency Department (HOSPITAL_COMMUNITY)
Admission: EM | Admit: 2019-04-13 | Discharge: 2019-04-13 | Disposition: A | Discharge status: Home / Self Care | Payer: Medicare Other | Attending: Emergency Medicine | Admitting: Emergency Medicine

## 2019-04-13 ENCOUNTER — Emergency Department (HOSPITAL_COMMUNITY): Payer: Medicare Other

## 2019-04-13 ENCOUNTER — Telehealth: Payer: Self-pay

## 2019-04-13 ENCOUNTER — Other Ambulatory Visit: Payer: Self-pay

## 2019-04-13 ENCOUNTER — Ambulatory Visit: Payer: Medicare Other | Admitting: Family Medicine

## 2019-04-13 ENCOUNTER — Encounter (HOSPITAL_COMMUNITY): Payer: Self-pay

## 2019-04-13 DIAGNOSIS — J90 Pleural effusion, not elsewhere classified: Secondary | ICD-10-CM | POA: Diagnosis not present

## 2019-04-13 DIAGNOSIS — Z7982 Long term (current) use of aspirin: Secondary | ICD-10-CM | POA: Insufficient documentation

## 2019-04-13 DIAGNOSIS — R0602 Shortness of breath: Secondary | ICD-10-CM | POA: Diagnosis not present

## 2019-04-13 DIAGNOSIS — Z7984 Long term (current) use of oral hypoglycemic drugs: Secondary | ICD-10-CM | POA: Insufficient documentation

## 2019-04-13 DIAGNOSIS — C3411 Malignant neoplasm of upper lobe, right bronchus or lung: Secondary | ICD-10-CM

## 2019-04-13 DIAGNOSIS — Z79899 Other long term (current) drug therapy: Secondary | ICD-10-CM | POA: Diagnosis not present

## 2019-04-13 DIAGNOSIS — I10 Essential (primary) hypertension: Secondary | ICD-10-CM | POA: Diagnosis not present

## 2019-04-13 DIAGNOSIS — F17210 Nicotine dependence, cigarettes, uncomplicated: Secondary | ICD-10-CM | POA: Insufficient documentation

## 2019-04-13 DIAGNOSIS — E119 Type 2 diabetes mellitus without complications: Secondary | ICD-10-CM | POA: Diagnosis not present

## 2019-04-13 LAB — BASIC METABOLIC PANEL
Anion gap: 11 (ref 5–15)
BUN: 17 mg/dL (ref 8–23)
CO2: 24 mmol/L (ref 22–32)
Calcium: 9.2 mg/dL (ref 8.9–10.3)
Chloride: 105 mmol/L (ref 98–111)
Creatinine, Ser: 0.84 mg/dL (ref 0.44–1.00)
GFR calc Af Amer: >60 mL/min (ref >60)
GFR calc non Af Amer: >60 mL/min (ref >60)
Glucose, Bld: 147 mg/dL — ABNORMAL HIGH (ref 70–99)
Potassium: 4.1 mmol/L (ref 3.5–5.1)
Sodium: 140 mmol/L (ref 135–145)

## 2019-04-13 LAB — CBC WITH DIFFERENTIAL/PLATELET
Abs Immature Granulocytes: 0.03 10*3/uL (ref 0.00–0.07)
Basophils Absolute: 0 10*3/uL (ref 0.0–0.1)
Basophils Relative: 0 %
Eosinophils Absolute: 0.3 10*3/uL (ref 0.0–0.5)
Eosinophils Relative: 3 %
HCT: 35.2 % — ABNORMAL LOW (ref 36.0–46.0)
Hemoglobin: 11 g/dL — ABNORMAL LOW (ref 12.0–15.0)
Immature Granulocytes: 0 %
Lymphocytes Relative: 16 %
Lymphs Abs: 1.5 10*3/uL (ref 0.7–4.0)
MCH: 29.5 pg (ref 26.0–34.0)
MCHC: 31.3 g/dL (ref 30.0–36.0)
MCV: 94.4 fL (ref 80.0–100.0)
Monocytes Absolute: 0.9 10*3/uL (ref 0.1–1.0)
Monocytes Relative: 10 %
Neutro Abs: 6.3 10*3/uL (ref 1.7–7.7)
Neutrophils Relative %: 71 %
Platelets: 439 10*3/uL — ABNORMAL HIGH (ref 150–400)
RBC: 3.73 MIL/uL — ABNORMAL LOW (ref 3.87–5.11)
RDW: 13.8 % (ref 11.5–15.5)
WBC: 9 10*3/uL (ref 4.0–10.5)
nRBC: 0 % (ref 0.0–0.2)

## 2019-04-13 LAB — TROPONIN I (HIGH SENSITIVITY): Troponin I (High Sensitivity): 5 ng/L (ref <18)

## 2019-04-13 MED ORDER — IOHEXOL 350 MG/ML SOLN
80.0000 mL | Freq: Once | INTRAVENOUS | Status: AC | PRN
  Administered 2019-04-13: 80 mL via INTRAVENOUS

## 2019-04-13 MED ORDER — HEPARIN SOD (PORK) LOCK FLUSH 100 UNIT/ML IV SOLN
500.0000 [IU] | Freq: Once | INTRAVENOUS | Status: DC
  Filled 2019-04-13: qty 5

## 2019-04-13 MED ORDER — SODIUM CHLORIDE (PF) 0.9 % IJ SOLN
INTRAMUSCULAR | Status: AC
  Filled 2019-04-13: qty 50

## 2019-04-13 NOTE — Discharge Instructions (Addendum)
Please call your oncologist tomorrow to discuss the symptoms you are having today and may need closer recheck this week.  Also would recommend calling your primary doctor to schedule a recheck later this week.  If you feel your breathing worsens, you develop chest pain, fever or other new concerning symptom please return to ER for reassessment.

## 2019-04-13 NOTE — ED Notes (Signed)
Urine sample sent down to lab with a save label. Patient ambulated to RR and back without assistance, steady gate noted. Daughter at bedside.

## 2019-04-13 NOTE — Telephone Encounter (Signed)
Pt was called to check on pt and her daughter counted respirations which were 24. Pt daughter was advised per JCL to go to the ER and we will monitor.

## 2019-04-13 NOTE — ED Triage Notes (Signed)
Patient states she began having increased SOB 3 days ago. Patient called her physician and was told to come to the ED. Patient has a history of lung cancer.

## 2019-04-13 NOTE — ED Provider Notes (Signed)
[facility] DEPT Provider Note   CSN: [id number] Arrival date & time: 04/13/19  1450     History   Chief Complaint Chief Complaint  Patient presents with  . Shortness of Breath  . Cancer patient    HPI [name] is a 78 y.o. female. Recurrent non-small cell lung cancer likely squamous cell carcinoma on chemotherapy currently.  Presents with shortness of breath for 3 days. RN over phone reported RR 24 and recommended ER assessment.  Patient has has not had any associated new cough, no associated fever.  No associated chest pain.  Dyspnea is worsened with exertion.  No alleviating factors.     HPI  Past Medical History:  Diagnosis Date  . Cancer ([place])    Lung   . Carotid artery occlusion   . Clostridium difficile infection 06/2013  . Diabetes mellitus    takes Janumet daily  . Dyslipidemia    takes Crestor daily  . Gallstones   . GERD (gastroesophageal reflux disease)   . Heart murmur    hx of  . History of radiation therapy 06/11/17-06/21/17   right lung 50 Gy in 5 fractions  . Hypertension    takes Prinzide and Verapamil daily  . Impaired speech    from stroke  . Neuropathy, diabetic ([place])   . Pneumonia   . PONV (postoperative nausea and vomiting)   . Seizures ([place]) 04/19/2018   recent hospitalization  . Smoker   . Stroke ([place]) 06/25/13  . Vertigo    but doesn't take any meds    Patient Active Problem List   Diagnosis Date Noted  . Port-A-Cath in place 10/21/2018  . Stage III squamous cell carcinoma of right lung ([place]) 06/09/2018  . Encounter for antineoplastic chemotherapy 06/09/2018  . Encounter for antineoplastic immunotherapy 06/09/2018  . Goals of care, counseling/discussion 05/16/2018  . Partial seizure ([place]) 04/21/2018  . TIA (transient ischemic attack) 04/09/2018  . Malnutrition of moderate degree 03/01/2018  . Non-small cell lung cancer ([place]) 05/28/2017  . Migraine equivalent 04/15/2017  . Hyperlipidemia  associated with type 2 diabetes mellitus ([place]) 03/07/2016  . Adenomatous colon polyp s/p laparoscopic right colectomy 09/02/15 09/02/2015  . Hemispheric carotid artery syndrome 04/07/2015  . Cataract associated with type 2 diabetes mellitus ([place]) 03/08/2015  . S/P recent Left carotid endarterectomy 07/14/2013  . History of CVA (cerebrovascular accident) 06/27/2013  . Hypertension associated with diabetes ([place]) 01/04/2011  . DM type 2 with diabetic dyslipidemia ([place]) 01/04/2011  . Light smoker 01/04/2011    Past Surgical History:  Procedure Laterality Date  . cataract removed Right   . CHOLECYSTECTOMY    . COLONOSCOPY    . ENDARTERECTOMY Left 07/14/2013   Procedure: ENDARTERECTOMY CAROTID-LEFT;  Surgeon: Elam Dutch, MD;  Location: [facility];  Service: Vascular;  Laterality: Left;  . EYE SURGERY Left    cataract  . IR IMAGING GUIDED PORT INSERTION  09/15/2018  . IR PERC PLEURAL DRAIN W/INDWELL CATH W/IMG GUIDE  05/23/2017  . KNEE SURGERY Left 14yr ago  . LAPAROSCOPIC PARTIAL COLECTOMY N/A 09/02/2015   Procedure: LAPAROSCOPIC PARTIAL RIGHT COLECTOMY;  Surgeon: ALeighton Ruff MD;  Location: [facility];  Service: General;  Laterality: N/A;  . PATCH ANGIOPLASTY Left 07/14/2013   Procedure: LEFT CAROTID ARTERY PATCH ANGIOPLASTY;  Surgeon: CElam Dutch MD;  [place] [facility]  Service: Vascular;  Laterality: Left;  .Marland KitchenVIDEO BRONCHOSCOPY N/A 06/04/2018   Procedure: VIDEO BRONCHOSCOPY;  Surgeon: HMelrose Nakayama MD;  [facility];  Service: Thoracic;  Laterality: N/A;  . wisdom       OB History   No obstetric history on file.      Home Medications    Prior to Admission medications   Medication Sig Start Date End Date Taking? Authorizing Provider  aspirin EC 81 MG tablet Take 81 mg by mouth daily.    [provider]  Blood Glucose Monitoring Suppl (ONE TOUCH ULTRA 2) w/Device KIT 1 Device by Does not apply route 2 (two) times daily. 08/21/17   [name], MD   clopidogrel (PLAVIX) 75 MG tablet Take 1 tablet (75 mg total) by mouth daily. 12/19/18   [name], MD  divalproex (DEPAKOTE ER) 500 MG 24 hr tablet Take one tablet daily 03/02/19   Garvin Fila, MD  glucose blood (ONE TOUCH ULTRA TEST) test strip Use as instructed 02/10/18   [name], MD  Lancets [facility] ULTRASOFT) lancets Use as instructed 08/21/17   [name], MD  lidocaine-prilocaine (EMLA) cream APPLY TO THE AFFECTED AREA DAILY AS NEEDED 03/20/19   Curt Bears, MD  lisinopril (PRINIVIL,ZESTRIL) 10 MG tablet Take 1 tablet (10 mg total) by mouth daily. 06/05/18   [name], MD  Multiple Vitamins-Minerals (MULTIVITAMIN PO) Take 1 tablet by mouth daily.    [provider]  pioglitazone (ACTOS) 30 MG tablet Take 1 tablet (30 mg total) by mouth daily. 02/23/19   [name], MD  prochlorperazine (COMPAZINE) 10 MG tablet Take 1 tablet (10 mg total) by mouth every 6 (six) hours as needed for nausea or vomiting. 06/09/18   Maryanna Shape, NP  rosuvastatin (CRESTOR) 20 MG tablet TAKE 1 TABLET BY MOUTH ONCE DAILY Patient taking differently: Take 20 mg by mouth daily.  04/30/18   [name], MD  SitaGLIPtin-MetFORMIN HCl (JANUMET XR) 50-1000 MG TB24 Take 1 tablet by mouth daily. 12/12/18   [name], MD  vitamin B-12 (CYANOCOBALAMIN) 1000 MCG tablet Take 1,000 mcg by mouth daily.    [provider]    Family History Family History  Problem Relation Age of Onset  . Leukemia Mother   . Diabetes Mother   . Cancer Father        esophageal ca  . Heart disease Father   . Diabetes Maternal Aunt   . Diabetes Maternal Uncle   . Diabetes Brother   . Diabetes Sister   . Heart attack Brother        x 2    Social History Social History   Tobacco Use  . Smoking status: Current Every Day Smoker    Packs/day: 0.50    Years: 60.00    Pack years: 30.00    Types: Cigarettes  . Smokeless tobacco: Never Used  Substance Use Topics  . Alcohol  use: No    Alcohol/week: 0.0 standard drinks  . Drug use: No     Allergies   Codeine, Benadryl [diphenhydramine], Lipitor [atorvastatin], and Phenergan [promethazine hcl]   Review of Systems Review of Systems  Constitutional: Negative for chills and fever.  HENT: Negative for ear pain and sore throat.   Eyes: Negative for pain and visual disturbance.  Respiratory: Positive for shortness of breath. Negative for cough.   Cardiovascular: Negative for chest pain and palpitations.  Gastrointestinal: Negative for abdominal pain and vomiting.  Genitourinary: Negative for dysuria and hematuria.  Musculoskeletal: Negative for arthralgias and back pain.  Skin: Negative for color change and rash.  Neurological: Negative for seizures and syncope.  All other systems reviewed and are negative.    Physical Exam Updated Vital Signs BP (!) 151/73 (BP Location: Left Arm)   Pulse 74   Temp 98.1 F (36.7 C) (Oral)   Resp (!) 27   Ht '5\' 6"'  (1.676 m)   Wt 63.5 kg   SpO2 100%   BMI 22.60 kg/m   Physical Exam Vitals signs and nursing note reviewed.  Constitutional:      General: She is not in acute distress.    Appearance: She is well-developed.  HENT:     Head: Normocephalic and atraumatic.  Eyes:     Conjunctiva/sclera: Conjunctivae normal.  Neck:     Musculoskeletal: Neck supple.  Cardiovascular:     Rate and Rhythm: Normal rate and regular rhythm.     Heart sounds: No murmur.  Pulmonary:     Effort: Pulmonary effort is normal. No respiratory distress.     Breath sounds: Normal breath sounds.  Abdominal:     Palpations: Abdomen is soft.     Tenderness: There is no abdominal tenderness.  Skin:    General: Skin is warm and dry.  Neurological:     Mental Status: She is alert.      ED Treatments / Results  Labs (all labs ordered are listed, but only abnormal results are displayed) Labs Reviewed - No data to display  EKG EKG Interpretation  Date/Time:  Monday  April 13 2019 15:09:41 EDT Ventricular Rate:  76 PR Interval:    QRS Duration: 135 QT Interval:  392 QTC Calculation: 441 R Axis:   16 Text Interpretation:  Sinus rhythm Right bundle branch block Baseline wander in lead(s) V3 since last tracing no significant change Confirmed by Daleen Bo [phone]) on 04/13/2019 5:48:04 PM   Radiology Dg Chest 2 View  Result Date: 04/13/2019 CLINICAL DATA:  Dyspnea.  History of bilateral lung cancer. EXAM: CHEST - 2 VIEW COMPARISON:  02/18/2027 chest CT. FINDINGS: Right internal jugular Port-A-Cath terminates in the middle third of the SVC. Stable cardiomediastinal silhouette with normal heart size. No pneumothorax. Small right pleural effusion appears increased. No left pleural effusion. Right upper lobe lung mass is not appreciably changed. Known superior segment left lower lobe pulmonary nodule is poorly visualized on chest radiographs. No acute consolidative airspace disease. No pulmonary edema. IMPRESSION: 1. Small right pleural effusion appears increased. 2. Right upper lobe lung mass is not appreciably changed. Known superior segment left lower lobe pulmonary nodule is poorly visualized on chest radiographs. Electronically Signed   By: Ilona Sorrel M.D.   On: 04/13/2019 15:55    Procedures Procedures (including critical care time)  Medications Ordered in ED Medications - No data to display   Initial Impression / Assessment and Plan / ED Course  I have reviewed the triage vital signs and the nursing notes.  Pertinent labs & imaging results that were available during my care of the patient were reviewed by me and considered in my medical decision making (see chart for details).        78 year old lady with lung cancer chemotherapy presents the ER with new shortness of breath.  Labs stable, no fever, no leukocytosis.  Chest imaging negative for acute pulmonary embolism, did demonstrate mild worsening of the right pleural effusion as well as  worsening of the right lung mass.  EKG without ischemic changes, no chest pain, doubt ACS.  Suspect symptoms related to either the pleural effusion or her underlying lung  cancer.  Recommend close recheck with PCP and her oncologist.  Reviewed return precautions, believe she is appropriate for discharge and outpatient management this time.    After the discussed management above, the patient was determined to be safe for discharge.  The patient was in agreement with this plan and all questions regarding their care were answered.  ED return precautions were discussed and the patient will return to the ED with any significant worsening of condition.      Final Clinical Impressions(s) / ED Diagnoses   Final diagnoses:  Malignant neoplasm of upper lobe of right lung [facility])  Pleural effusion    ED Discharge Orders    None       Lucrezia Starch, MD 04/14/19 [phone]

## 2019-04-14 ENCOUNTER — Telehealth: Payer: Self-pay | Admitting: Medical Oncology

## 2019-04-14 NOTE — Telephone Encounter (Signed)
CT scan "worsening " . Done yesterday. appt next week .  Pt states her breathing "is okay" today.

## 2019-04-14 NOTE — Telephone Encounter (Signed)
Nothing to do. Will discuss the results and treatment next week. Thank you.

## 2019-04-15 NOTE — Telephone Encounter (Signed)
LVM Mohameds message for [employer].

## 2019-04-21 ENCOUNTER — Encounter: Payer: Self-pay | Admitting: Physician Assistant

## 2019-04-21 ENCOUNTER — Inpatient Hospital Stay: Payer: Medicare Other

## 2019-04-21 ENCOUNTER — Inpatient Hospital Stay (HOSPITAL_BASED_OUTPATIENT_CLINIC_OR_DEPARTMENT_OTHER): Payer: Medicare Other | Admitting: Physician Assistant

## 2019-04-21 ENCOUNTER — Inpatient Hospital Stay: Payer: Medicare Other | Attending: Internal Medicine

## 2019-04-21 ENCOUNTER — Other Ambulatory Visit: Payer: Self-pay

## 2019-04-21 VITALS — BP 143/59 | HR 81 | Temp 98.2°F | Resp 20 | Ht 66.0 in | Wt 140.9 lb

## 2019-04-21 DIAGNOSIS — Z79899 Other long term (current) drug therapy: Secondary | ICD-10-CM | POA: Insufficient documentation

## 2019-04-21 DIAGNOSIS — F17210 Nicotine dependence, cigarettes, uncomplicated: Secondary | ICD-10-CM | POA: Diagnosis not present

## 2019-04-21 DIAGNOSIS — C3411 Malignant neoplasm of upper lobe, right bronchus or lung: Secondary | ICD-10-CM | POA: Diagnosis not present

## 2019-04-21 DIAGNOSIS — Z923 Personal history of irradiation: Secondary | ICD-10-CM | POA: Insufficient documentation

## 2019-04-21 DIAGNOSIS — Z5112 Encounter for antineoplastic immunotherapy: Secondary | ICD-10-CM | POA: Insufficient documentation

## 2019-04-21 DIAGNOSIS — R05 Cough: Secondary | ICD-10-CM | POA: Diagnosis not present

## 2019-04-21 DIAGNOSIS — I10 Essential (primary) hypertension: Secondary | ICD-10-CM | POA: Diagnosis not present

## 2019-04-21 DIAGNOSIS — Z8673 Personal history of transient ischemic attack (TIA), and cerebral infarction without residual deficits: Secondary | ICD-10-CM | POA: Diagnosis not present

## 2019-04-21 DIAGNOSIS — C3491 Malignant neoplasm of unspecified part of right bronchus or lung: Secondary | ICD-10-CM

## 2019-04-21 DIAGNOSIS — Z7984 Long term (current) use of oral hypoglycemic drugs: Secondary | ICD-10-CM | POA: Insufficient documentation

## 2019-04-21 DIAGNOSIS — R0609 Other forms of dyspnea: Secondary | ICD-10-CM | POA: Insufficient documentation

## 2019-04-21 DIAGNOSIS — E1140 Type 2 diabetes mellitus with diabetic neuropathy, unspecified: Secondary | ICD-10-CM | POA: Insufficient documentation

## 2019-04-21 DIAGNOSIS — E785 Hyperlipidemia, unspecified: Secondary | ICD-10-CM | POA: Diagnosis not present

## 2019-04-21 DIAGNOSIS — Z7982 Long term (current) use of aspirin: Secondary | ICD-10-CM | POA: Insufficient documentation

## 2019-04-21 DIAGNOSIS — Z95828 Presence of other vascular implants and grafts: Secondary | ICD-10-CM

## 2019-04-21 LAB — CMP (CANCER CENTER ONLY)
ALT: 8 U/L (ref 0–44)
AST: 12 U/L — ABNORMAL LOW (ref 15–41)
Albumin: 3.4 g/dL — ABNORMAL LOW (ref 3.5–5.0)
Alkaline Phosphatase: 64 U/L (ref 38–126)
Anion gap: 10 (ref 5–15)
BUN: 14 mg/dL (ref 8–23)
CO2: 27 mmol/L (ref 22–32)
Calcium: 9.6 mg/dL (ref 8.9–10.3)
Chloride: 102 mmol/L (ref 98–111)
Creatinine: 0.89 mg/dL (ref 0.44–1.00)
GFR, Est AFR Am: >60 mL/min (ref >60)
GFR, Estimated: >60 mL/min (ref >60)
Glucose, Bld: 141 mg/dL — ABNORMAL HIGH (ref 70–99)
Potassium: 4.1 mmol/L (ref 3.5–5.1)
Sodium: 139 mmol/L (ref 135–145)
Total Bilirubin: 0.4 mg/dL (ref 0.3–1.2)
Total Protein: 6.9 g/dL (ref 6.5–8.1)

## 2019-04-21 LAB — CBC WITH DIFFERENTIAL (CANCER CENTER ONLY)
Abs Immature Granulocytes: 0.04 10*3/uL (ref 0.00–0.07)
Basophils Absolute: 0 10*3/uL (ref 0.0–0.1)
Basophils Relative: 0 %
Eosinophils Absolute: 0.3 10*3/uL (ref 0.0–0.5)
Eosinophils Relative: 3 %
HCT: 36.1 % (ref 36.0–46.0)
Hemoglobin: 11.5 g/dL — ABNORMAL LOW (ref 12.0–15.0)
Immature Granulocytes: 0 %
Lymphocytes Relative: 16 %
Lymphs Abs: 1.6 10*3/uL (ref 0.7–4.0)
MCH: 29.2 pg (ref 26.0–34.0)
MCHC: 31.9 g/dL (ref 30.0–36.0)
MCV: 91.6 fL (ref 80.0–100.0)
Monocytes Absolute: 0.9 10*3/uL (ref 0.1–1.0)
Monocytes Relative: 9 %
Neutro Abs: 7.4 10*3/uL (ref 1.7–7.7)
Neutrophils Relative %: 72 %
Platelet Count: 439 10*3/uL — ABNORMAL HIGH (ref 150–400)
RBC: 3.94 MIL/uL (ref 3.87–5.11)
RDW: 13.9 % (ref 11.5–15.5)
WBC Count: 10.3 10*3/uL (ref 4.0–10.5)
nRBC: 0 % (ref 0.0–0.2)

## 2019-04-21 LAB — TSH: TSH: 0.524 u[IU]/mL (ref 0.308–3.960)

## 2019-04-21 MED ORDER — SODIUM CHLORIDE 0.9% FLUSH
10.0000 mL | INTRAVENOUS | Status: DC | PRN
  Administered 2019-04-21: 10 mL
  Filled 2019-04-21: qty 10

## 2019-04-21 MED ORDER — SODIUM CHLORIDE 0.9 % IV SOLN
10.2000 mg/kg | Freq: Once | INTRAVENOUS | Status: AC
  Administered 2019-04-21: 16:00:00 620 mg via INTRAVENOUS
  Filled 2019-04-21: qty 2.4

## 2019-04-21 MED ORDER — SODIUM CHLORIDE 0.9 % IV SOLN
Freq: Once | INTRAVENOUS | Status: AC
  Administered 2019-04-21: 16:00:00 via INTRAVENOUS
  Filled 2019-04-21: qty 250

## 2019-04-21 MED ORDER — HEPARIN SOD (PORK) LOCK FLUSH 100 UNIT/ML IV SOLN
500.0000 [IU] | Freq: Once | INTRAVENOUS | Status: AC | PRN
  Administered 2019-04-21: 500 [IU]
  Filled 2019-04-21: qty 5

## 2019-04-21 NOTE — Patient Instructions (Signed)
[facility] Discharge Instructions for Patients Receiving Chemotherapy  Today you received the following chemotherapy agents: Imfinzi.  To help prevent nausea and vomiting after your treatment, we encourage you to take your nausea medication as directed.   If you develop nausea and vomiting that is not controlled by your nausea medication, call the clinic.   BELOW ARE SYMPTOMS THAT SHOULD BE REPORTED IMMEDIATELY:  *FEVER GREATER THAN 100.5 F  *CHILLS WITH OR WITHOUT FEVER  NAUSEA AND VOMITING THAT IS NOT CONTROLLED WITH YOUR NAUSEA MEDICATION  *UNUSUAL SHORTNESS OF BREATH  *UNUSUAL BRUISING OR BLEEDING  TENDERNESS IN MOUTH AND THROAT WITH OR WITHOUT PRESENCE OF ULCERS  *URINARY PROBLEMS  *BOWEL PROBLEMS  UNUSUAL RASH Items with * indicate a potential emergency and should be followed up as soon as possible.  Feel free to call the clinic should you have any questions or concerns. The clinic phone number is [phone].  Please show the CHEMO ALERT CARD at check-in to the Emergency Department and triage nurse.   

## 2019-04-21 NOTE — Progress Notes (Signed)
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 The patient presented today with concerning findings for disease recurrence and progression with enlarging and hypermetabolic right upper lobe Walker mass presenting as stage IIIa (T3, N0, M0).  PDL 1 expression 0%.  PRIOR THERAPY: 1) Curative stereotactic radiotherapy completed on June 21, 2017. 2) A course of concurrent chemoradiation with chemotherapy consisting of carboplatin for an AUC of 2 and paclitaxel 45 mg/m.First dose started on 06/23/2018.Status post 7 cycles. Last dose of chemotherapy was given August 04, 2018. 3) SBRT to the enlarging left lower lobe pulmonary nodule under the care of Dr. Sondra Come. Last radiation treatment scheduled for 03/26/2019  CURRENT THERAPY: 1) Consolidation immunotherapy with Imfinzi 10 mg/KG every 2 weeks. First dose September 09, 2018. Status post16cycles.  INTERVAL HISTORY: [name] 78 y.o. female returns to the clinic for a follow up visit. The patient was recently seen in the emergency room on 04/13/2019 for the chief complaint of a cough and worsening dyspnea on exertion. A CT angiogram was performed which showed an increase in her right upper lobe mass as well as a new spiculated nodule in the left Walker and an increasing right pleural effusion.   The patient recently completed radiation to the enlarging left lower lobe pulmonary nodule on 03/26/2019. She is scheduled for a one month follow up visit later this week.   Today, she is feeling well without any concerning complaints. She denies any recent fevers, chills, night sweats, or weight loss. She reports mildly worsening dyspnea on exertion as well  as a cough. She denies chest pain or hemoptyosis. She denies nausea, vomiting, diarrhea, or constipation. She denies headaches, visual changes, or gait changes. She denies any rashes or skin changes. She is here for evaluation and to review the results from the CT angiogram that was performed in the ER before starting cycle #17.    MEDICAL HISTORY: Past Medical History:  Diagnosis Date  . Cancer ([place])    Walker   . Carotid artery occlusion   . Clostridium difficile infection 06/2013  . Diabetes mellitus    takes Janumet daily  . Dyslipidemia    takes Crestor daily  . Gallstones   . GERD (gastroesophageal reflux disease)   . Heart murmur    hx of  . History of radiation therapy 06/11/17-06/21/17   right Walker 50 Gy in 5 fractions  . Hypertension    takes Prinzide and Verapamil daily  . Impaired speech    from stroke  . Neuropathy, diabetic ([place])   . Pneumonia   . PONV (postoperative nausea and vomiting)   . Seizures ([place]) 04/19/2018   recent hospitalization  . Smoker   . Stroke ([place]) 06/25/13  . Vertigo    but doesn't take any meds    ALLERGIES:  is allergic to codeine; benadryl [diphenhydramine]; lipitor [atorvastatin]; and phenergan [promethazine hcl].  MEDICATIONS:  Current Outpatient Medications  Medication Sig Dispense Refill  . aspirin EC 81 MG tablet Take 81 mg by mouth daily.    . Blood Glucose Monitoring Suppl (ONE TOUCH ULTRA 2) w/Device KIT 1 Device by Does not apply route 2 (two) times daily. 1 each 0  . clopidogrel (PLAVIX) 75 MG tablet Take 1 tablet (75 mg total) by mouth  daily. 90 tablet 3  . divalproex (DEPAKOTE ER) 500 MG 24 hr tablet Take one tablet daily 30 tablet 6  . glucose blood (ONE TOUCH ULTRA TEST) test strip Use as instructed 100 each 12  . Lancets (ONETOUCH ULTRASOFT) lancets Use as instructed 100 each 12  . lidocaine-prilocaine (EMLA) cream APPLY TO THE AFFECTED AREA DAILY AS NEEDED (Patient taking differently: Apply 1 application topically daily  as needed (Pain). ) 30 g 0  . lisinopril (PRINIVIL,ZESTRIL) 10 MG tablet Take 1 tablet (10 mg total) by mouth daily. 90 tablet 3  . Multiple Vitamins-Minerals (MULTIVITAMIN PO) Take 1 tablet by mouth daily.    . pioglitazone (ACTOS) 30 MG tablet Take 1 tablet (30 mg total) by mouth daily. 90 tablet 1  . rosuvastatin (CRESTOR) 20 MG tablet TAKE 1 TABLET BY MOUTH ONCE DAILY (Patient taking differently: Take 20 mg by mouth daily. ) 180 tablet 0  . SitaGLIPtin-MetFORMIN HCl (JANUMET XR) 50-1000 MG TB24 Take 1 tablet by mouth daily. 60 tablet 5  . vitamin B-12 (CYANOCOBALAMIN) 1000 MCG tablet Take 1,000 mcg by mouth daily.    . prochlorperazine (COMPAZINE) 10 MG tablet Take 1 tablet (10 mg total) by mouth every 6 (six) hours as needed for nausea or vomiting. (Patient not taking: Reported on 04/13/2019) 30 tablet 0   No current facility-administered medications for this visit.     SURGICAL HISTORY:  Past Surgical History:  Procedure Laterality Date  . cataract removed Right   . CHOLECYSTECTOMY    . COLONOSCOPY    . ENDARTERECTOMY Left 07/14/2013   Procedure: ENDARTERECTOMY CAROTID-LEFT;  Surgeon: Elam Dutch, MD;  Location: [facility];  Service: Vascular;  Laterality: Left;  . EYE SURGERY Left    cataract  . IR IMAGING GUIDED PORT INSERTION  09/15/2018  . IR PERC PLEURAL DRAIN W/INDWELL CATH W/IMG GUIDE  05/23/2017  . KNEE SURGERY Left 15yr ago  . LAPAROSCOPIC PARTIAL COLECTOMY N/A 09/02/2015   Procedure: LAPAROSCOPIC PARTIAL RIGHT COLECTOMY;  Surgeon: ALeighton Ruff MD;  Location: [facility];  Service: General;  Laterality: N/A;  . PATCH ANGIOPLASTY Left 07/14/2013   Procedure: LEFT CAROTID ARTERY PATCH ANGIOPLASTY;  Surgeon: CElam Dutch MD;  [facility]  Service: Vascular;  Laterality: Left;  .Marland KitchenVIDEO BRONCHOSCOPY N/A 06/04/2018   Procedure: VIDEO BRONCHOSCOPY;  Surgeon: HMelrose Nakayama MD;  [facility];  Service: Thoracic;  Laterality: N/A;  . wisdom      REVIEW OF  SYSTEMS:   Review of Systems  Constitutional: Negative for appetite change, chills, fatigue, fever and unexpected weight change.  HENT: Negative for mouth sores, nosebleeds, sore throat and trouble swallowing.   Eyes: Negative for eye problems and icterus.  Respiratory: Positive for cough and dyspnea on exertion. Negative for  Hemoptysis and wheezing.   Cardiovascular: Negative for chest pain and leg swelling.  Gastrointestinal: Negative for abdominal pain, constipation, diarrhea, nausea and vomiting.  Genitourinary: Negative for bladder incontinence, difficulty urinating, dysuria, frequency and hematuria.   Musculoskeletal: Negative for back pain, gait problem, neck pain and neck stiffness.  Skin: Negative for itching and rash.  Neurological: Negative for dizziness, extremity weakness, gait problem, headaches, light-headedness and seizures.  Hematological: Negative for adenopathy. Does not bruise/bleed easily.  Psychiatric/Behavioral: Negative for confusion, depression and sleep disturbance. The patient is not nervous/anxious.     PHYSICAL EXAMINATION:  Blood pressure (!) 143/59, pulse 81, temperature 98.2 F (36.8 C), temperature source Temporal, resp. rate 20, height _0  (1.676 m), weight 140  lb 14.4 oz (63.9 kg), SpO2 100 %.  ECOG PERFORMANCE STATUS: 1 - Symptomatic but completely ambulatory  Physical Exam  Constitutional: Oriented to person, place, and time and well-developed, well-nourished, and in no distress.  HENT:  Head: Normocephalic and atraumatic.  Mouth/Throat: Oropharynx is clear and moist. No oropharyngeal exudate.  Eyes: Conjunctivae are normal. Right eye exhibits no discharge. Left eye exhibits no discharge. No scleral icterus.  Neck: Normal range of motion. Neck supple.  Cardiovascular: Normal rate, regular rhythm, normal heart sounds and intact distal pulses.   Pulmonary/Chest: Effort normal. Wheezing in the right upper lobe. Decreased breath sounds in the right  lower lobe. No respiratory distress. No rales.  Abdominal: Soft. Bowel sounds are normal. Exhibits no distension and no mass. There is no tenderness.  Musculoskeletal: Normal range of motion. Exhibits no edema.  Lymphadenopathy:    No cervical adenopathy.  Neurological: Alert and oriented to person, place, and time. Exhibits normal muscle tone. Gait normal. Coordination normal.  Skin: Skin is warm and dry. No rash noted. Not diaphoretic. No erythema. No pallor.  Psychiatric: Mood, memory and judgment normal.  Vitals reviewed.  LABORATORY DATA: Lab Results  Component Value Date   WBC 10.3 04/21/2019   HGB 11.5 (L) 04/21/2019   HCT 36.1 04/21/2019   MCV 91.6 04/21/2019   PLT 439 (H) 04/21/2019      Chemistry      Component Value Date/Time   NA 139 04/21/2019 1407   NA 139 05/07/2018 1147   K 4.1 04/21/2019 1407   CL 102 04/21/2019 1407   CO2 27 04/21/2019 1407   BUN 14 04/21/2019 1407   BUN 15 05/07/2018 1147   CREATININE 0.89 04/21/2019 1407   CREATININE 0.87 03/08/2015 0001      Component Value Date/Time   CALCIUM 9.6 04/21/2019 1407   ALKPHOS 64 04/21/2019 1407   AST 12 (L) 04/21/2019 1407   ALT 8 04/21/2019 1407   BILITOT 0.4 04/21/2019 1407       RADIOGRAPHIC STUDIES:  Dg Chest 2 View  Result Date: 04/13/2019 CLINICAL DATA:  Dyspnea.  History of bilateral Walker cancer. EXAM: CHEST - 2 VIEW COMPARISON:  02/18/2027 chest CT. FINDINGS: Right internal jugular Port-A-Cath terminates in the middle third of the SVC. Stable cardiomediastinal silhouette with normal heart size. No pneumothorax. Small right pleural effusion appears increased. No left pleural effusion. Right upper lobe Walker mass is not appreciably changed. Known superior segment left lower lobe pulmonary nodule is poorly visualized on chest radiographs. No acute consolidative airspace disease. No pulmonary edema. IMPRESSION: 1. Small right pleural effusion appears increased. 2. Right upper lobe Walker mass is not  appreciably changed. Known superior segment left lower lobe pulmonary nodule is poorly visualized on chest radiographs. Electronically Signed   By: Ilona Sorrel M.D.   On: 04/13/2019 15:55   Ct Angio Chest Pe W And/or Wo Contrast  Result Date: 04/13/2019 CLINICAL DATA:  New onset dyspnea, in cancer patient, increasing shortness of breath EXAM: CT ANGIOGRAPHY CHEST WITH CONTRAST TECHNIQUE: Multidetector CT imaging of the chest was performed using the standard protocol during bolus administration of intravenous contrast. Multiplanar CT image reconstructions and MIPs were obtained to evaluate the vascular anatomy. CONTRAST:  63m OMNIPAQUE IOHEXOL 350 MG/ML SOLN COMPARISON:  CT February 18, 2019 FINDINGS: Cardiovascular: Satisfactory opacification of the pulmonary arteries to the segmental level. No evidence of pulmonary embolism. No central pulmonary arterial enlargement or other CT findings to suggest right heart strain. Normal heart size. No pericardial  effusion. Atherosclerotic calcification of the coronary arteries. Atherosclerotic plaque within the normal caliber aorta. No acute aortic abnormality or periaortic stranding. Shared origin of the left common carotid and brachiocephalic arteries. Mild plaque in the proximal great vessels. Mediastinum/Nodes: No enlarged mediastinal or axillary lymph nodes. Thyroid gland, trachea, and esophagus demonstrate no significant findings. Lungs/Pleura: Redemonstration of a spiculated right upper lobe mass abutting the right hilar structures measuring 5.1 x 5.5 x 3.7 cm size, increased from 4.6 x 3.8 x 2.8 cm on comparison study. Mass demonstrates extension to the adjacent pleural surfaces. There is a separate spiculated 11 x 8 x 9 mm nodule in the superior segment left upper lobe, also increased in size from comparison study. Nonspecific 5 mm nodule in the apicoposterior segment of the left upper lobe is new from prior. Mild features of centrilobular emphysema. There is an  increasing right pleural effusion. No left effusion. No pneumothorax. No consolidative airspace process. Upper Abdomen: No acute abnormalities present in the visualized portions of the upper abdomen. Patient is post cholecystectomy. Few noninflamed colonic diverticular present. Mild nonspecific perinephric stranding is similar to prior.Stable fluid attenuation cyst in the upper pole left kidney. Additional soft tissue attenuation lesion measuring 1.2 cm in the upper pole left kidney is unchanged from prior. Musculoskeletal: Diffusely heterogeneous marrow attenuation in the spine is similar to comparison studies. No discrete lytic or sclerotic lesions. No acute fracture or other traumatic osseous injury is identified Review of the MIP images confirms the above findings. IMPRESSION: 1. No evidence of pulmonary embolism. 2. Interval increase in size of the spiculated right upper lobe mass, now measuring up to 5.5 cm, previously 4.6 cm. 3. New spiculated 11 x 8 x 9 mm nodule in the superior segment of the left upper lobe, concerning for metastatic disease. Additional 5 mm subpleural nodule in the posterior segment left upper lobe, could reflect subpleural lymph node or additional metastatic focus. 4. Increasing right pleural effusion. 5. 1.2 cm intermediate attenuation lesion in the upper pole of the left kidney suspicious for RCC. 6. Aortic Atherosclerosis (ICD10-I70.0) and Emphysema (ICD10-J43.9). Electronically Signed   By: Lovena Le M.D.   On: 04/13/2019 22:14     ASSESSMENT/PLAN:  This is a very pleasant 78 year old Caucasian female with recurrentstage IIIAnon-small cell Walker cancer, squamous cell carcinomaof the right upper lobe. She presented initially as a stage IIa in August of 2018.  The patient underwent a course of concurrent chemoradiation with weekly carboplatin and paclitaxel. She status post 7 cycles. She tolerated treatment well.   She is currently undergoing consolidation immunotherapy  with Imfinzi 10 mg/kg IV every 2 weeks. She is status post16 cycles. She continues to tolerate her treatment well without any adverse side effects.  She recently completed SBRT under the care of Dr. Sondra Come to the enlarging left pulmonary nodule on 03/26/2019.   The patient was seen recently in the emergency room. A CT angiogram was performed which was negative for a pulmonary embolism but showed an increasing right upper lobe nodule, an increasing right pleural effusion.   Dr. Julien Nordmann personally and independently reviewed the scan and discussed the results with the patient, and her daughter who was available by phone. Dr. Julien Nordmann recommends that the patient have a thoracentesis with cytology to assess if the pleural effusion is malignant. We will arrange for this to be performed later this week or early next week. If the patient's cytology is positive for malignant cells, Dr. Julien Nordmann will consider changing her treatment at her next  appointment.   The patient will receive cycle #17 today as scheduled of Imfinzi.   We will see her back for a follow up visit in 2 weeks for evaluation and to review her cytology from her pleural fluid before proceeding with cycle #18.   The patient was advised to call immediately if she has any concerning symptoms in the interval. The patient voices understanding of current disease status and treatment options and is in agreement with the current care plan. All questions were answered. The patient knows to call the clinic with any problems, questions or concerns. We can certainly see the patient much sooner if necessary.   Orders Placed This Encounter  Procedures  . US Thoracentesis Asp Pleural space w/IMG guide    Standing Status:   Future    Standing Expiration Date:   04/20/2020    Order Specific Question:   Are labs required for specimen collection?    Answer:   Yes    Order Specific Question:   Lab orders requested (DO NOT place separate lab orders, these  will be automatically ordered during procedure specimen collection):    Answer:   Cytology - Non Pap    Order Specific Question:   Reason for Exam (SYMPTOM  OR DIAGNOSIS REQUIRED)    Answer:   Ultrasound guided right thoracentesis    Order Specific Question:   Preferred imaging location?    Answer:   [place], PA-C 04/21/19  ADDENDUM: Hematology/Oncology Attending: I had a face-to-face encounter with the patient today.  I recommended her care plan.  This is a very pleasant 78 years old white female with recurrent non-small cell Walker cancer status post a course of concurrent chemoradiation with weekly carboplatin and paclitaxel and she is currently undergoing consolidation immunotherapy with Imfinzi status post 16 cycles.  She has been tolerating her treatment well with no concerning adverse effects. The patient was recently admitted to the hospital with worsening shortness of breath and CT angiogram scan of the chest showed no evidence for pulmonary embolism but there was enlarging right pleural effusion as well as increase in the size of the right hilar mass as well as questionable nodule in the left upper lobe. I had a lengthy discussion with the patient and her daughter today about her current condition and treatment options. I recommended for the patient to proceed with ultrasound-guided right thoracentesis for drainage of the pleural fluid and I will also send the fluid for cytology evaluation. I recommended for the patient to continue her current treatment with Imfinzi and she will proceed with cycle #17 today.  We will continue to monitor her Walker lesions closely on the upcoming scan and if she has any further progression, will consider the patient for change in her treatment. She will come back for follow-up visit in 2 weeks for evaluation before starting cycle #18. The patient was advised to call immediately if she has any concerning symptoms in  the interval.  Disclaimer: This note was dictated with voice recognition software. Similar sounding words can inadvertently be transcribed and may be missed upon review. Eilleen Kempf, MD 04/21/19

## 2019-04-22 ENCOUNTER — Telehealth: Payer: Self-pay | Admitting: Internal Medicine

## 2019-04-22 NOTE — Telephone Encounter (Signed)
Scheduled appt per 10/6 los - pt to get an updated schedule next visit. Added additional appt to appts already scheduled.

## 2019-04-22 NOTE — Progress Notes (Signed)
Radiation Oncology         [phone] ________________________________  Name: [name] MRN: [id number]  Date: 04/23/2019  DOB: [birth year]  Follow-Up Visit Note  CC: [name], MD  Hosie Poisson, MD    ICD-10-CM   1. Non-small cell cancer of left lung [facility])  C34.92     Diagnosis:   Recurrent non-small cell lung cancer, now with enlarging pulmonary nodule in the superior segment of the left lower lobe  Interval Since Last Radiation:  1 month  03/12/2019 through 03/26/2019 Site Technique Total Dose Dose per Fx Completed Fx Beam Energies  Thorax: Lung_Lt IMRT 50/50 10 5/5 6XFFF   Narrative:  The patient returns today for routine follow-up. She continues on immunotherapy, consisting of Imfinzi, under Dr. Julien Nordmann.  Since her last visit, she presented to the ED on 04/13/2019 with new onset dyspnea and increasing shortness of breath. She underwent angio chest CT at that time, which revealed: interval increase in size of spiculated right upper lobe mass, now 5.5 cm (up from 4.6 cm); new spiculated 11 mm nodule in superior LUL; additional 5 mm subpleural nodule in posterior LUL; increasing right pleural effusion.  She last saw Dr. Julien Nordmann on 04/21/2019, who recommended thoracentesis for the right pleural effusion with cytology study. He also recommended following the findings seen on recent chest CT before changing treatment.  On review of systems, she reports occasional productive cough with white sputum and shortness of breath with exertion, such as walking. She denies fatigue, pain, hemoptysis, and difficulty swallowing.  ALLERGIES:  is allergic to codeine; benadryl [diphenhydramine]; lipitor [atorvastatin]; and phenergan [promethazine hcl].  Meds: Current Outpatient Medications  Medication Sig Dispense Refill   aspirin EC 81 MG tablet Take 81 mg by mouth daily.     Blood Glucose Monitoring Suppl (ONE TOUCH ULTRA 2) w/Device KIT 1 Device by Does not apply route 2 (two)  times daily. 1 each 0   clopidogrel (PLAVIX) 75 MG tablet Take 1 tablet (75 mg total) by mouth daily. 90 tablet 3   divalproex (DEPAKOTE ER) 500 MG 24 hr tablet Take one tablet daily 30 tablet 6   glucose blood (ONE TOUCH ULTRA TEST) test strip Use as instructed 100 each 12   Lancets (ONETOUCH ULTRASOFT) lancets Use as instructed 100 each 12   lidocaine-prilocaine (EMLA) cream APPLY TO THE AFFECTED AREA DAILY AS NEEDED (Patient taking differently: Apply 1 application topically daily as needed (Pain). ) 30 g 0   lisinopril (PRINIVIL,ZESTRIL) 10 MG tablet Take 1 tablet (10 mg total) by mouth daily. 90 tablet 3   Multiple Vitamins-Minerals (MULTIVITAMIN PO) Take 1 tablet by mouth daily.     pioglitazone (ACTOS) 30 MG tablet Take 1 tablet (30 mg total) by mouth daily. 90 tablet 1   rosuvastatin (CRESTOR) 20 MG tablet TAKE 1 TABLET BY MOUTH ONCE DAILY (Patient taking differently: Take 20 mg by mouth daily. ) 180 tablet 0   SitaGLIPtin-MetFORMIN HCl (JANUMET XR) 50-1000 MG TB24 Take 1 tablet by mouth daily. 60 tablet 5   vitamin B-12 (CYANOCOBALAMIN) 1000 MCG tablet Take 1,000 mcg by mouth daily.     prochlorperazine (COMPAZINE) 10 MG tablet Take 1 tablet (10 mg total) by mouth every 6 (six) hours as needed for nausea or vomiting. (Patient not taking: Reported on 04/13/2019) 30 tablet 0   No current facility-administered medications for this encounter.     Physical Findings: The patient is in no acute distress. Patient is alert and oriented.  height  is _0  (1.676 m) and weight is 142 lb 4 oz (64.5 kg). Her temporal temperature is 98.7 F (37.1 C). Her blood pressure is 146/62 (abnormal) and her pulse is 88. Her respiration is 18 and oxygen saturation is 98%. .  No significant changes. Lungs are clear to auscultation bilaterally. Heart has regular rate and rhythm. No palpable cervical, supraclavicular, or axillary adenopathy. Abdomen soft, non-tender, normal bowel sounds.   Lab  Findings: Lab Results  Component Value Date   WBC 10.3 04/21/2019   HGB 11.5 (L) 04/21/2019   HCT 36.1 04/21/2019   MCV 91.6 04/21/2019   PLT 439 (H) 04/21/2019    Radiographic Findings: Dg Chest 2 View  Result Date: 04/13/2019 CLINICAL DATA:  Dyspnea.  History of bilateral lung cancer. EXAM: CHEST - 2 VIEW COMPARISON:  02/18/2027 chest CT. FINDINGS: Right internal jugular Port-A-Cath terminates in the middle third of the SVC. Stable cardiomediastinal silhouette with normal heart size. No pneumothorax. Small right pleural effusion appears increased. No left pleural effusion. Right upper lobe lung mass is not appreciably changed. Known superior segment left lower lobe pulmonary nodule is poorly visualized on chest radiographs. No acute consolidative airspace disease. No pulmonary edema. IMPRESSION: 1. Small right pleural effusion appears increased. 2. Right upper lobe lung mass is not appreciably changed. Known superior segment left lower lobe pulmonary nodule is poorly visualized on chest radiographs. Electronically Signed   By: Ilona Sorrel M.D.   On: 04/13/2019 15:55   Ct Angio Chest Pe W And/or Wo Contrast  Result Date: 04/13/2019 CLINICAL DATA:  New onset dyspnea, in cancer patient, increasing shortness of breath EXAM: CT ANGIOGRAPHY CHEST WITH CONTRAST TECHNIQUE: Multidetector CT imaging of the chest was performed using the standard protocol during bolus administration of intravenous contrast. Multiplanar CT image reconstructions and MIPs were obtained to evaluate the vascular anatomy. CONTRAST:  35m OMNIPAQUE IOHEXOL 350 MG/ML SOLN COMPARISON:  CT February 18, 2019 FINDINGS: Cardiovascular: Satisfactory opacification of the pulmonary arteries to the segmental level. No evidence of pulmonary embolism. No central pulmonary arterial enlargement or other CT findings to suggest right heart strain. Normal heart size. No pericardial effusion. Atherosclerotic calcification of the coronary arteries.  Atherosclerotic plaque within the normal caliber aorta. No acute aortic abnormality or periaortic stranding. Shared origin of the left common carotid and brachiocephalic arteries. Mild plaque in the proximal great vessels. Mediastinum/Nodes: No enlarged mediastinal or axillary lymph nodes. Thyroid gland, trachea, and esophagus demonstrate no significant findings. Lungs/Pleura: Redemonstration of a spiculated right upper lobe mass abutting the right hilar structures measuring 5.1 x 5.5 x 3.7 cm size, increased from 4.6 x 3.8 x 2.8 cm on comparison study. Mass demonstrates extension to the adjacent pleural surfaces. There is a separate spiculated 11 x 8 x 9 mm nodule in the superior segment left upper lobe, also increased in size from comparison study. Nonspecific 5 mm nodule in the apicoposterior segment of the left upper lobe is new from prior. Mild features of centrilobular emphysema. There is an increasing right pleural effusion. No left effusion. No pneumothorax. No consolidative airspace process. Upper Abdomen: No acute abnormalities present in the visualized portions of the upper abdomen. Patient is post cholecystectomy. Few noninflamed colonic diverticular present. Mild nonspecific perinephric stranding is similar to prior.Stable fluid attenuation cyst in the upper pole left kidney. Additional soft tissue attenuation lesion measuring 1.2 cm in the upper pole left kidney is unchanged from prior. Musculoskeletal: Diffusely heterogeneous marrow attenuation in the spine is similar to comparison studies. No  discrete lytic or sclerotic lesions. No acute fracture or other traumatic osseous injury is identified Review of the MIP images confirms the above findings. IMPRESSION: 1. No evidence of pulmonary embolism. 2. Interval increase in size of the spiculated right upper lobe mass, now measuring up to 5.5 cm, previously 4.6 cm. 3. New spiculated 11 x 8 x 9 mm nodule in the superior segment of the left upper lobe,  concerning for metastatic disease. Additional 5 mm subpleural nodule in the posterior segment left upper lobe, could reflect subpleural lymph node or additional metastatic focus. 4. Increasing right pleural effusion. 5. 1.2 cm intermediate attenuation lesion in the upper pole of the left kidney suspicious for RCC. 6. Aortic Atherosclerosis (ICD10-I70.0) and Emphysema (ICD10-J43.9). Electronically Signed   By: Lovena Le M.D.   On: 04/13/2019 22:14    Impression:  The patient is recovering from the effects of radiation.  I reviewed the patient's most recent chest CT scan as it relates to her stereotactic body radiation therapy of her  left lower lobe pulmonary nodule.  There has been significant shrinkage of this nodule, but this was not reported in her most recent chest CT scan.  As above she has progression in the right upper lobe area.  She is not a candidate for retreatment with radiation to this area.  She will have thoracentesis to assess for malignant pleural effusion.  If this is positive then Dr. Julien Nordmann is considering changing therapy.  Plan: PRN follow-up in radiation oncology.  Patient will continue with therapy through medical oncology.  ____________________________________ Gery Pray, MD   This document serves as a record of services personally performed by Gery Pray, MD. It was created on his behalf by Wilburn Mylar, a trained medical scribe. The creation of this record is based on the scribe's personal observations and the provider's statements to them. This document has been checked and approved by the attending provider.

## 2019-04-22 NOTE — Progress Notes (Signed)
  Patient Name: [name] MRN: [id number] DOB: 15-Mar-1941 Referring Physician: Hosie Poisson (Profile Not Attached) Date of Service: 03/26/2019 [facility] [place]                                                        End Of Treatment Note  Diagnoses: C34.11-Malignant neoplasm of upper lobe, right bronchus or lung C34.32-Malignant neoplasm of lower lobe, left bronchus or lung  Cancer Staging: Recurrent non-small cell lung cancer, now with enlarging pulmonary nodule in the superior segment of the left lower lobe  Intent: Curative  Radiation Treatment Dates: 03/12/2019 through 03/26/2019 Site Technique Total Dose Dose per Fx Completed Fx Beam Energies  Thorax: Lung_Lt IMRT 50/50 10 5/5 6XFFF   Narrative: The patient tolerated radiation therapy relatively well. She reported cough with occasional production of white sputum. She denied shortness of breath, pain, hemoptysis, and difficulty swallowing.  Plan: The patient will follow-up with radiation oncology in 1 month.  ________________________________________________   Blair Promise, PhD, MD  This document serves as a record of services personally performed by Gery Pray, MD. It was created on his behalf by Wilburn Mylar, a trained medical scribe. The creation of this record is based on the scribe's personal observations and the provider's statements to them. This document has been checked and approved by the attending provider.

## 2019-04-23 ENCOUNTER — Ambulatory Visit
Admission: RE | Admit: 2019-04-23 | Discharge: 2019-04-23 | Disposition: A | Discharge status: Home / Self Care | Payer: Medicare Other | Source: Ambulatory Visit | Attending: Radiation Oncology | Admitting: Radiation Oncology

## 2019-04-23 ENCOUNTER — Encounter: Payer: Self-pay | Admitting: Radiation Oncology

## 2019-04-23 ENCOUNTER — Other Ambulatory Visit: Payer: Self-pay

## 2019-04-23 VITALS — BP 146/62 | HR 88 | Temp 98.7°F | Resp 18 | Ht 66.0 in | Wt 142.2 lb

## 2019-04-23 DIAGNOSIS — Z923 Personal history of irradiation: Secondary | ICD-10-CM | POA: Insufficient documentation

## 2019-04-23 DIAGNOSIS — Z7982 Long term (current) use of aspirin: Secondary | ICD-10-CM | POA: Insufficient documentation

## 2019-04-23 DIAGNOSIS — J90 Pleural effusion, not elsewhere classified: Secondary | ICD-10-CM | POA: Diagnosis not present

## 2019-04-23 DIAGNOSIS — C3411 Malignant neoplasm of upper lobe, right bronchus or lung: Secondary | ICD-10-CM | POA: Diagnosis not present

## 2019-04-23 DIAGNOSIS — C3432 Malignant neoplasm of lower lobe, left bronchus or lung: Secondary | ICD-10-CM | POA: Diagnosis not present

## 2019-04-23 DIAGNOSIS — C3492 Malignant neoplasm of unspecified part of left bronchus or lung: Secondary | ICD-10-CM

## 2019-04-23 DIAGNOSIS — Z79899 Other long term (current) drug therapy: Secondary | ICD-10-CM | POA: Diagnosis not present

## 2019-04-23 DIAGNOSIS — R911 Solitary pulmonary nodule: Secondary | ICD-10-CM | POA: Insufficient documentation

## 2019-04-23 DIAGNOSIS — Z7984 Long term (current) use of oral hypoglycemic drugs: Secondary | ICD-10-CM | POA: Insufficient documentation

## 2019-04-23 NOTE — Patient Instructions (Signed)
Coronavirus (COVID-19) Are you at risk?  Are you at risk for the Coronavirus (COVID-19)?  To be considered HIGH RISK for Coronavirus (COVID-19), you have to meet the following criteria:  . Traveled to [place], [place], [place], [place] or [place]; or in the [place] to [place], [place], [place], or [place]; and have fever, cough, and shortness of breath within the last 2 weeks of travel OR . Been in close contact with a person diagnosed with COVID-19 within the last 2 weeks and have fever, cough, and shortness of breath . IF YOU DO NOT MEET THESE CRITERIA, YOU ARE CONSIDERED LOW RISK FOR COVID-19.  What to do if you are HIGH RISK for COVID-19?  . If you are having a medical emergency, call 911. . Seek medical care right away. Before you go to a doctor's office, urgent care or emergency department, call ahead and tell them about your recent travel, contact with someone diagnosed with COVID-19, and your symptoms. You should receive instructions from your physician's office regarding next steps of care.  . When you arrive at healthcare provider, tell the healthcare staff immediately you have returned from visiting [place], [place], [place], [place] or [place]; or traveled in the [place] to [place], [place], [place], or [place]; in the last two weeks or you have been in close contact with a person diagnosed with COVID-19 in the last 2 weeks.   . Tell the health care staff about your symptoms: fever, cough and shortness of breath. . After you have been seen by a medical provider, you will be either: o Tested for (COVID-19) and discharged home on quarantine except to seek medical care if symptoms worsen, and asked to  - Stay home and avoid contact with others until you get your results (4-5 days)  - Avoid travel on public transportation if possible (such as bus, train, or airplane) or o Sent to the Emergency Department by EMS for evaluation, COVID-19 testing, and possible  admission depending on your condition and test results.  What to do if you are LOW RISK for COVID-19?  Reduce your risk of any infection by using the same precautions used for avoiding the common cold or flu:  . Wash your hands often with soap and warm water for at least 20 seconds.  If soap and water are not readily available, use an alcohol-based hand sanitizer with at least 60% alcohol.  . If coughing or sneezing, cover your mouth and nose by coughing or sneezing into the elbow areas of your shirt or coat, into a tissue or into your sleeve (not your hands). . Avoid shaking hands with others and consider head nods or verbal greetings only. . Avoid touching your eyes, nose, or mouth with unwashed hands.  . Avoid close contact with people who are sick. . Avoid places or events with large numbers of people in one location, like concerts or sporting events. . Carefully consider travel plans you have or are making. . If you are planning any travel outside or inside the [place], visit the CDC's Travelers' Health webpage for the latest health notices. . If you have some symptoms but not all symptoms, continue to monitor at home and seek medical attention if your symptoms worsen. . If you are having a medical emergency, call 911.   ADDITIONAL HEALTHCARE OPTIONS FOR PATIENTS  [facility] / e-Visit: [email or web address]         [facility]: [phone]  [place]   [facility]: [phone]                   [facility]: [phone]   

## 2019-04-23 NOTE — Progress Notes (Signed)
Pt presents today for f/u with Dr. Sondra Come. Pt denies fatigue. Pt denies c/o pain. Pt reports occasional cough with occasional white sputum. Pt denies hemoptysis. Pt reports SOB with exertion such as walking. Pt denies difficulty swallowing. Pt has had recent CT scan and pt reports Dr. Julien Nordmann will biopsy pleural effusion.  BP (!) 146/62 (BP Location: Left Arm, Patient Position: Sitting)   Pulse 88   Temp 98.7 F (37.1 C) (Temporal)   Resp 18   Ht 5\' 6"  (1.676 m)   Wt 142 lb 4 oz (64.5 kg)   SpO2 98%   BMI 22.96 kg/m   Wt Readings from Last 3 Encounters:  04/23/19 142 lb 4 oz (64.5 kg)  04/21/19 140 lb 14.4 oz (63.9 kg)  04/13/19 140 lb (63.5 kg)   Loma Sousa, RN BSN

## 2019-04-29 ENCOUNTER — Ambulatory Visit (HOSPITAL_COMMUNITY)
Admission: RE | Admit: 2019-04-29 | Discharge: 2019-04-29 | Disposition: A | Discharge status: Home / Self Care | Payer: Medicare Other | Source: Ambulatory Visit | Attending: Internal Medicine | Admitting: Internal Medicine

## 2019-04-29 DIAGNOSIS — Z01812 Encounter for preprocedural laboratory examination: Secondary | ICD-10-CM | POA: Diagnosis not present

## 2019-04-29 DIAGNOSIS — Z20828 Contact with and (suspected) exposure to other viral communicable diseases: Secondary | ICD-10-CM | POA: Insufficient documentation

## 2019-04-29 LAB — SARS CORONAVIRUS 2 (TAT 6-24 HRS): SARS Coronavirus 2: NEGATIVE

## 2019-05-01 ENCOUNTER — Ambulatory Visit (HOSPITAL_COMMUNITY)
Admission: RE | Admit: 2019-05-01 | Discharge: 2019-05-01 | Disposition: A | Discharge status: Home / Self Care | Payer: Medicare Other | Source: Ambulatory Visit | Attending: Internal Medicine | Admitting: Internal Medicine

## 2019-05-01 ENCOUNTER — Ambulatory Visit (HOSPITAL_COMMUNITY)
Admission: RE | Admit: 2019-05-01 | Discharge: 2019-05-01 | Disposition: A | Discharge status: Home / Self Care | Payer: Medicare Other | Source: Ambulatory Visit | Attending: Radiology | Admitting: Radiology

## 2019-05-01 ENCOUNTER — Other Ambulatory Visit: Payer: Self-pay

## 2019-05-01 DIAGNOSIS — J90 Pleural effusion, not elsewhere classified: Secondary | ICD-10-CM | POA: Diagnosis not present

## 2019-05-01 DIAGNOSIS — Z9889 Other specified postprocedural states: Secondary | ICD-10-CM | POA: Insufficient documentation

## 2019-05-01 DIAGNOSIS — C3491 Malignant neoplasm of unspecified part of right bronchus or lung: Secondary | ICD-10-CM | POA: Insufficient documentation

## 2019-05-01 DIAGNOSIS — J948 Other specified pleural conditions: Secondary | ICD-10-CM | POA: Diagnosis not present

## 2019-05-01 MED ORDER — LIDOCAINE HCL 1 % IJ SOLN
INTRAMUSCULAR | Status: AC
  Filled 2019-05-01: qty 10

## 2019-05-01 NOTE — Procedures (Signed)
Ultrasound-guided diagnostic and therapeutic right thoracentesis performed yielding 1 liter of yellow fluid. No immediate complications. Follow-up chest x-ray pending.EBL < 1cc.

## 2019-05-05 ENCOUNTER — Other Ambulatory Visit: Payer: Self-pay | Admitting: Medical Oncology

## 2019-05-05 ENCOUNTER — Encounter: Payer: Self-pay | Admitting: Internal Medicine

## 2019-05-05 ENCOUNTER — Ambulatory Visit (HOSPITAL_BASED_OUTPATIENT_CLINIC_OR_DEPARTMENT_OTHER): Payer: Medicare Other | Admitting: Medical

## 2019-05-05 ENCOUNTER — Other Ambulatory Visit (INDEPENDENT_AMBULATORY_CARE_PROVIDER_SITE_OTHER): Payer: Medicare Other

## 2019-05-05 ENCOUNTER — Inpatient Hospital Stay: Payer: Medicare Other

## 2019-05-05 ENCOUNTER — Inpatient Hospital Stay (HOSPITAL_BASED_OUTPATIENT_CLINIC_OR_DEPARTMENT_OTHER): Payer: Medicare Other | Admitting: Internal Medicine

## 2019-05-05 ENCOUNTER — Other Ambulatory Visit: Payer: Self-pay

## 2019-05-05 VITALS — Wt 143.5 lb

## 2019-05-05 VITALS — BP 140/57 | HR 88 | Temp 98.3°F | Resp 18

## 2019-05-05 DIAGNOSIS — C3492 Malignant neoplasm of unspecified part of left bronchus or lung: Secondary | ICD-10-CM

## 2019-05-05 DIAGNOSIS — Z5112 Encounter for antineoplastic immunotherapy: Secondary | ICD-10-CM

## 2019-05-05 DIAGNOSIS — E538 Deficiency of other specified B group vitamins: Secondary | ICD-10-CM | POA: Diagnosis not present

## 2019-05-05 DIAGNOSIS — C3491 Malignant neoplasm of unspecified part of right bronchus or lung: Secondary | ICD-10-CM

## 2019-05-05 DIAGNOSIS — C3411 Malignant neoplasm of upper lobe, right bronchus or lung: Secondary | ICD-10-CM | POA: Diagnosis not present

## 2019-05-05 DIAGNOSIS — R0609 Other forms of dyspnea: Secondary | ICD-10-CM | POA: Diagnosis not present

## 2019-05-05 DIAGNOSIS — E1140 Type 2 diabetes mellitus with diabetic neuropathy, unspecified: Secondary | ICD-10-CM | POA: Diagnosis not present

## 2019-05-05 DIAGNOSIS — Z8673 Personal history of transient ischemic attack (TIA), and cerebral infarction without residual deficits: Secondary | ICD-10-CM | POA: Diagnosis not present

## 2019-05-05 DIAGNOSIS — Z7982 Long term (current) use of aspirin: Secondary | ICD-10-CM | POA: Diagnosis not present

## 2019-05-05 DIAGNOSIS — Z7984 Long term (current) use of oral hypoglycemic drugs: Secondary | ICD-10-CM | POA: Diagnosis not present

## 2019-05-05 DIAGNOSIS — R05 Cough: Secondary | ICD-10-CM | POA: Diagnosis not present

## 2019-05-05 DIAGNOSIS — Z95828 Presence of other vascular implants and grafts: Secondary | ICD-10-CM

## 2019-05-05 DIAGNOSIS — E785 Hyperlipidemia, unspecified: Secondary | ICD-10-CM | POA: Diagnosis not present

## 2019-05-05 DIAGNOSIS — Z923 Personal history of irradiation: Secondary | ICD-10-CM | POA: Diagnosis not present

## 2019-05-05 DIAGNOSIS — Z79899 Other long term (current) drug therapy: Secondary | ICD-10-CM | POA: Diagnosis not present

## 2019-05-05 DIAGNOSIS — I10 Essential (primary) hypertension: Secondary | ICD-10-CM | POA: Diagnosis not present

## 2019-05-05 LAB — CMP (CANCER CENTER ONLY)
ALT: 7 U/L (ref 0–44)
AST: 11 U/L — ABNORMAL LOW (ref 15–41)
Albumin: 3.1 g/dL — ABNORMAL LOW (ref 3.5–5.0)
Alkaline Phosphatase: 60 U/L (ref 38–126)
Anion gap: 13 (ref 5–15)
BUN: 17 mg/dL (ref 8–23)
CO2: 27 mmol/L (ref 22–32)
Calcium: 9.6 mg/dL (ref 8.9–10.3)
Chloride: 101 mmol/L (ref 98–111)
Creatinine: 0.96 mg/dL (ref 0.44–1.00)
GFR, Est AFR Am: >60 mL/min (ref >60)
GFR, Estimated: 57 mL/min — ABNORMAL LOW (ref >60)
Glucose, Bld: 181 mg/dL — ABNORMAL HIGH (ref 70–99)
Potassium: 3.9 mmol/L (ref 3.5–5.1)
Sodium: 141 mmol/L (ref 135–145)
Total Bilirubin: 0.4 mg/dL (ref 0.3–1.2)
Total Protein: 6.7 g/dL (ref 6.5–8.1)

## 2019-05-05 LAB — CBC WITH DIFFERENTIAL (CANCER CENTER ONLY)
Abs Immature Granulocytes: 0.03 10*3/uL (ref 0.00–0.07)
Basophils Absolute: 0 10*3/uL (ref 0.0–0.1)
Basophils Relative: 0 %
Eosinophils Absolute: 0.3 10*3/uL (ref 0.0–0.5)
Eosinophils Relative: 3 %
HCT: 35 % — ABNORMAL LOW (ref 36.0–46.0)
Hemoglobin: 11.2 g/dL — ABNORMAL LOW (ref 12.0–15.0)
Immature Granulocytes: 0 %
Lymphocytes Relative: 14 %
Lymphs Abs: 1.3 10*3/uL (ref 0.7–4.0)
MCH: 29.9 pg (ref 26.0–34.0)
MCHC: 32 g/dL (ref 30.0–36.0)
MCV: 93.3 fL (ref 80.0–100.0)
Monocytes Absolute: 0.8 10*3/uL (ref 0.1–1.0)
Monocytes Relative: 8 %
Neutro Abs: 7 10*3/uL (ref 1.7–7.7)
Neutrophils Relative %: 75 %
Platelet Count: 431 10*3/uL — ABNORMAL HIGH (ref 150–400)
RBC: 3.75 MIL/uL — ABNORMAL LOW (ref 3.87–5.11)
RDW: 13.9 % (ref 11.5–15.5)
WBC Count: 9.5 10*3/uL (ref 4.0–10.5)
nRBC: 0 % (ref 0.0–0.2)

## 2019-05-05 MED ORDER — HEPARIN SOD (PORK) LOCK FLUSH 100 UNIT/ML IV SOLN
500.0000 [IU] | Freq: Once | INTRAVENOUS | Status: AC | PRN
  Filled 2019-05-05: qty 5

## 2019-05-05 MED ORDER — SODIUM CHLORIDE 0.9 % IV SOLN
Freq: Once | INTRAVENOUS | Status: AC
  Administered 2019-05-05: 16:00:00 via INTRAVENOUS
  Filled 2019-05-05: qty 250

## 2019-05-05 MED ORDER — SODIUM CHLORIDE 0.9% FLUSH
10.0000 mL | INTRAVENOUS | Status: DC | PRN
  Administered 2019-05-05: 10 mL
  Filled 2019-05-05: qty 10

## 2019-05-05 MED ORDER — HEPARIN SOD (PORK) LOCK FLUSH 100 UNIT/ML IV SOLN
500.0000 [IU] | Freq: Once | INTRAVENOUS | Status: AC | PRN
  Administered 2019-05-05: 18:00:00 500 [IU]
  Filled 2019-05-05: qty 5

## 2019-05-05 MED ORDER — SODIUM CHLORIDE 0.9% FLUSH
10.0000 mL | INTRAVENOUS | Status: AC | PRN
  Filled 2019-05-05: qty 10

## 2019-05-05 MED ORDER — ALTEPLASE 2 MG IJ SOLR
2.0000 mg | Freq: Once | INTRAMUSCULAR | Status: AC | PRN
  Administered 2019-05-05: 2 mg
  Filled 2019-05-05: qty 2

## 2019-05-05 MED ORDER — ALTEPLASE 2 MG IJ SOLR
INTRAMUSCULAR | Status: AC
  Filled 2019-05-05: qty 2

## 2019-05-05 MED ORDER — CYANOCOBALAMIN 1000 MCG/ML IJ SOLN
1000.0000 ug | Freq: Once | INTRAMUSCULAR | Status: AC
  Administered 2019-05-05: 15:00:00 1000 ug via INTRAMUSCULAR

## 2019-05-05 MED ORDER — SODIUM CHLORIDE 0.9 % IV SOLN
10.2000 mg/kg | Freq: Once | INTRAVENOUS | Status: AC
  Administered 2019-05-05: 16:00:00 620 mg via INTRAVENOUS
  Filled 2019-05-05: qty 2.4

## 2019-05-05 MED ORDER — SODIUM CHLORIDE 0.9% FLUSH
10.0000 mL | INTRAVENOUS | Status: DC | PRN
  Administered 2019-05-05: 18:00:00 10 mL
  Filled 2019-05-05: qty 10

## 2019-05-05 NOTE — Progress Notes (Signed)
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 The patient presented today with concerning findings for disease recurrence and progression with enlarging and hypermetabolic right upper lobe Walker mass presenting as stage IIIa (T3, N0, M0).  PDL 1 expression 0%.  PRIOR THERAPY: 1) Curative stereotactic radiotherapy completed on June 21, 2017. 2) A course of concurrent chemoradiation with chemotherapy consisting of carboplatin for an AUC of 2 and paclitaxel 45 mg/m.  First dose started on 06/23/2018.  Status post 7 cycles.  Last dose of chemotherapy was given August 04, 2018.  CURRENT THERAPY:Consolidation immunotherapy with Imfinzi 10 mg/KG every 2 weeks.  First dose September 09, 2018.  Status post 17 cycles.  INTERVAL HISTORY: [name] 78 y.o. female returns to the clinic today for follow-up visit.  The patient is feeling fine today with no concerning complaints.  She has no chest pain, shortness of breath, cough or hemoptysis.  She denied having any fever or chills.  She has no nausea, vomiting, diarrhea or constipation.  She has no recent weight loss or night sweats.  She has been tolerating her treatment with consolidation Imfinzi fairly well.  The patient is here today for evaluation before starting cycle #18.  MEDICAL HISTORY: Past Medical History:  Diagnosis Date  . Cancer ([place])    Walker   . Carotid artery occlusion   . Clostridium difficile infection 06/2013  . Diabetes mellitus    takes Janumet daily  . Dyslipidemia    takes Crestor daily  . Gallstones   . GERD (gastroesophageal reflux disease)   . Heart murmur    hx  of  . History of radiation therapy 06/11/17-06/21/17   right Walker 50 Gy in 5 fractions  . Hypertension    takes Prinzide and Verapamil daily  . Impaired speech    from stroke  . Neuropathy, diabetic ([place])   . Pneumonia   . PONV (postoperative nausea and vomiting)   . Seizures ([place]) 04/19/2018   recent hospitalization  . Smoker   . Stroke ([place]) 06/25/13  . Vertigo    but doesn't take any meds    ALLERGIES:  is allergic to codeine; benadryl [diphenhydramine]; lipitor [atorvastatin]; and phenergan [promethazine hcl].  MEDICATIONS:  Current Outpatient Medications  Medication Sig Dispense Refill  . aspirin EC 81 MG tablet Take 81 mg by mouth daily.    . Blood Glucose Monitoring Suppl (ONE TOUCH ULTRA 2) w/Device KIT 1 Device by Does not apply route 2 (two) times daily. 1 each 0  . clopidogrel (PLAVIX) 75 MG tablet Take 1 tablet (75 mg total) by mouth daily. 90 tablet 3  . divalproex (DEPAKOTE ER) 500 MG 24 hr tablet Take one tablet daily 30 tablet 6  . glucose blood (ONE TOUCH ULTRA TEST) test strip Use as instructed 100 each 12  . Lancets (ONETOUCH ULTRASOFT) lancets Use as instructed 100 each 12  . lidocaine-prilocaine (EMLA) cream APPLY TO THE AFFECTED AREA DAILY AS NEEDED (Patient taking differently: Apply 1 application topically daily as needed (Pain). ) 30 g 0  . lisinopril (PRINIVIL,ZESTRIL) 10 MG tablet Take 1 tablet (10 mg total) by mouth daily. 90 tablet 3  . Multiple Vitamins-Minerals (MULTIVITAMIN PO) Take 1 tablet  by mouth daily.    . pioglitazone (ACTOS) 30 MG tablet Take 1 tablet (30 mg total) by mouth daily. 90 tablet 1  . prochlorperazine (COMPAZINE) 10 MG tablet Take 1 tablet (10 mg total) by mouth every 6 (six) hours as needed for nausea or vomiting. (Patient not taking: Reported on 04/13/2019) 30 tablet 0  . rosuvastatin (CRESTOR) 20 MG tablet TAKE 1 TABLET BY MOUTH ONCE DAILY (Patient taking differently: Take 20 mg by mouth daily. ) 180 tablet 0  .  SitaGLIPtin-MetFORMIN HCl (JANUMET XR) 50-1000 MG TB24 Take 1 tablet by mouth daily. 60 tablet 5  . vitamin B-12 (CYANOCOBALAMIN) 1000 MCG tablet Take 1,000 mcg by mouth daily.     No current facility-administered medications for this visit.     SURGICAL HISTORY:  Past Surgical History:  Procedure Laterality Date  . cataract removed Right   . CHOLECYSTECTOMY    . COLONOSCOPY    . ENDARTERECTOMY Left 07/14/2013   Procedure: ENDARTERECTOMY CAROTID-LEFT;  Surgeon: Charles E Fields, MD;  Location: [facility] OR;  Service: Vascular;  Laterality: Left;  . EYE SURGERY Left    cataract  . IR IMAGING GUIDED PORT INSERTION  09/15/2018  . IR PERC PLEURAL DRAIN W/INDWELL CATH W/IMG GUIDE  05/23/2017  . KNEE SURGERY Left 13yrs ago  . LAPAROSCOPIC PARTIAL COLECTOMY N/A 09/02/2015   Procedure: LAPAROSCOPIC PARTIAL RIGHT COLECTOMY;  Surgeon: Alicia Thomas, MD;  Location: [facility] ORS;  Service: General;  Laterality: N/A;  . PATCH ANGIOPLASTY Left 07/14/2013   Procedure: LEFT CAROTID ARTERY PATCH ANGIOPLASTY;  Surgeon: Charles E Fields, MD;  Location: [facility] OR;  Service: Vascular;  Laterality: Left;  . VIDEO BRONCHOSCOPY N/A 06/04/2018   Procedure: VIDEO BRONCHOSCOPY;  Surgeon: Hendrickson, Steven C, MD;  Location: [facility] OR;  Service: Thoracic;  Laterality: N/A;  . wisdom      REVIEW OF SYSTEMS:  A comprehensive review of systems was negative.   PHYSICAL EXAMINATION: General appearance: alert, cooperative and no distress Head: Normocephalic, without obvious abnormality, atraumatic Neck: no adenopathy, no JVD, supple, symmetrical, trachea midline and thyroid not enlarged, symmetric, no tenderness/mass/nodules Lymph nodes: Cervical, supraclavicular, and axillary nodes normal. Resp: clear to auscultation bilaterally Back: symmetric, no curvature. ROM normal. No CVA tenderness. Cardio: regular rate and rhythm, S1, S2 normal, no murmur, click, rub or gallop GI: soft, non-tender; bowel sounds normal; no masses,  no  organomegaly Extremities: extremities normal, atraumatic, no cyanosis or edema  ECOG PERFORMANCE STATUS: 1 - Symptomatic but completely ambulatory  Blood pressure (!) 140/57, pulse 88, temperature 98.3 F (36.8 C), temperature source Tympanic, resp. rate 18, SpO2 100 %.  LABORATORY DATA: Lab Results  Component Value Date   WBC 10.3 04/21/2019   HGB 11.5 (L) 04/21/2019   HCT 36.1 04/21/2019   MCV 91.6 04/21/2019   PLT 439 (H) 04/21/2019      Chemistry      Component Value Date/Time   NA 139 04/21/2019 1407   NA 139 05/07/2018 1147   K 4.1 04/21/2019 1407   CL 102 04/21/2019 1407   CO2 27 04/21/2019 1407   BUN 14 04/21/2019 1407   BUN 15 05/07/2018 1147   CREATININE 0.89 04/21/2019 1407   CREATININE 0.87 03/08/2015 0001      Component Value Date/Time   CALCIUM 9.6 04/21/2019 1407   ALKPHOS 64 04/21/2019 1407   AST 12 (L) 04/21/2019 1407   ALT 8 04/21/2019 1407   BILITOT 0.4 04/21/2019 1407       RADIOGRAPHIC STUDIES: Dg Chest   1 View  Result Date: 05/01/2019 CLINICAL DATA:  Right-sided thoracentesis. EXAM: CHEST  1 VIEW COMPARISON:  CT 04/13/2019.  Chest x-ray 04/13/2019. FINDINGS: PowerPort catheter with tip over superior vena cava. Mediastinum and hilar structures stable. Heart size stable. Small right pleural effusion remains. No pneumothorax post thoracentesis. Right upper Walker mass again noted. Known left Walker mass best visualized by prior CT. IMPRESSION: No evidence of pneumothorax post thoracentesis. Electronically Signed   By: Thomas  Register   On: 05/01/2019 10:41   Dg Chest 2 View  Result Date: 04/13/2019 CLINICAL DATA:  Dyspnea.  History of bilateral Walker cancer. EXAM: CHEST - 2 VIEW COMPARISON:  02/18/2027 chest CT. FINDINGS: Right internal jugular Port-A-Cath terminates in the middle third of the SVC. Stable cardiomediastinal silhouette with normal heart size. No pneumothorax. Small right pleural effusion appears increased. No left pleural effusion. Right  upper lobe Walker mass is not appreciably changed. Known superior segment left lower lobe pulmonary nodule is poorly visualized on chest radiographs. No acute consolidative airspace disease. No pulmonary edema. IMPRESSION: 1. Small right pleural effusion appears increased. 2. Right upper lobe Walker mass is not appreciably changed. Known superior segment left lower lobe pulmonary nodule is poorly visualized on chest radiographs. Electronically Signed   By: Jason A Poff M.D.   On: 04/13/2019 15:55   Ct Angio Chest Pe W And/or Wo Contrast  Result Date: 04/13/2019 CLINICAL DATA:  New onset dyspnea, in cancer patient, increasing shortness of breath EXAM: CT ANGIOGRAPHY CHEST WITH CONTRAST TECHNIQUE: Multidetector CT imaging of the chest was performed using the standard protocol during bolus administration of intravenous contrast. Multiplanar CT image reconstructions and MIPs were obtained to evaluate the vascular anatomy. CONTRAST:  80mL OMNIPAQUE IOHEXOL 350 MG/ML SOLN COMPARISON:  CT February 18, 2019 FINDINGS: Cardiovascular: Satisfactory opacification of the pulmonary arteries to the segmental level. No evidence of pulmonary embolism. No central pulmonary arterial enlargement or other CT findings to suggest right heart strain. Normal heart size. No pericardial effusion. Atherosclerotic calcification of the coronary arteries. Atherosclerotic plaque within the normal caliber aorta. No acute aortic abnormality or periaortic stranding. Shared origin of the left common carotid and brachiocephalic arteries. Mild plaque in the proximal great vessels. Mediastinum/Nodes: No enlarged mediastinal or axillary lymph nodes. Thyroid gland, trachea, and esophagus demonstrate no significant findings. Lungs/Pleura: Redemonstration of a spiculated right upper lobe mass abutting the right hilar structures measuring 5.1 x 5.5 x 3.7 cm size, increased from 4.6 x 3.8 x 2.8 cm on comparison study. Mass demonstrates extension to the adjacent  pleural surfaces. There is a separate spiculated 11 x 8 x 9 mm nodule in the superior segment left upper lobe, also increased in size from comparison study. Nonspecific 5 mm nodule in the apicoposterior segment of the left upper lobe is new from prior. Mild features of centrilobular emphysema. There is an increasing right pleural effusion. No left effusion. No pneumothorax. No consolidative airspace process. Upper Abdomen: No acute abnormalities present in the visualized portions of the upper abdomen. Patient is post cholecystectomy. Few noninflamed colonic diverticular present. Mild nonspecific perinephric stranding is similar to prior.Stable fluid attenuation cyst in the upper pole left kidney. Additional soft tissue attenuation lesion measuring 1.2 cm in the upper pole left kidney is unchanged from prior. Musculoskeletal: Diffusely heterogeneous marrow attenuation in the spine is similar to comparison studies. No discrete lytic or sclerotic lesions. No acute fracture or other traumatic osseous injury is identified Review of the MIP images confirms the above findings. IMPRESSION: 1. No   evidence of pulmonary embolism. 2. Interval increase in size of the spiculated right upper lobe mass, now measuring up to 5.5 cm, previously 4.6 cm. 3. New spiculated 11 x 8 x 9 mm nodule in the superior segment of the left upper lobe, concerning for metastatic disease. Additional 5 mm subpleural nodule in the posterior segment left upper lobe, could reflect subpleural lymph node or additional metastatic focus. 4. Increasing right pleural effusion. 5. 1.2 cm intermediate attenuation lesion in the upper pole of the left kidney suspicious for RCC. 6. Aortic Atherosclerosis (ICD10-I70.0) and Emphysema (ICD10-J43.9). Electronically Signed   By: Price  DeHay M.D.   On: 04/13/2019 22:14   Us Thoracentesis Asp Pleural Space W/img Guide  Result Date: 05/01/2019 INDICATION: Patient with history of Walker cancer, dyspnea, right pleural  effusion. Request made for diagnostic and therapeutic right thoracentesis. EXAM: ULTRASOUND GUIDED DIAGNOSTIC AND THERAPEUTIC RIGHT THORACENTESIS MEDICATIONS: None COMPLICATIONS: None immediate. PROCEDURE: An ultrasound guided thoracentesis was thoroughly discussed with the patient and questions answered. The benefits, risks, alternatives and complications were also discussed. The patient understands and wishes to proceed with the procedure. Written consent was obtained. Ultrasound was performed to localize and mark an adequate pocket of fluid in the right chest. The area was then prepped and draped in the normal sterile fashion. 1% Lidocaine was used for local anesthesia. Under ultrasound guidance a 6 Fr Safe-T-Centesis catheter was introduced. Thoracentesis was performed. The catheter was removed and a dressing applied. FINDINGS: A total of approximately 1 liter of yellow fluid was removed. Samples were sent to the laboratory as requested by the clinical team. IMPRESSION: Successful ultrasound guided diagnostic and therapeutic right thoracentesis yielding 1 liter of pleural fluid. Read by: Kevin Allred, PA-C Electronically Signed   By: Heath  McCullough M.D.   On: 05/01/2019 11:06    ASSESSMENT AND PLAN: This is a very pleasant 78 years old white female with recurrent non-small cell Walker cancer presented as a stage IIIa involving the right lower lobe. The patient underwent a course of concurrent chemoradiation with weekly carboplatin and paclitaxel status post 7 cycles.  She tolerated this treatment well. She had repeat CT scan of the chest performed recently.  I personally and independently reviewed the scan images and discussed the results with the patient and her daughter. Her scan showed improvement of the right Walker mass with no other concerning findings for progression. The patient was started on treatment with consolidation immunotherapy with Imfinzi status post 17 cycles.   The patient has been  tolerating the treatment well with no concerning adverse effects. I recommended for her to proceed with cycle #18 today as planned. We will see her back for follow-up visit in 2 weeks for evaluation before the next cycle of her treatment. We will continue to monitor her TSH closely during treatment. The patient was advised to call immediately if she has any concerning symptoms in the interval. The patient voices understanding of current disease status and treatment options and is in agreement with the current care plan. All questions were answered. The patient knows to call the clinic with any problems, questions or concerns. We can certainly see the patient much sooner if necessary.  Disclaimer: This note was dictated with voice recognition software. Similar sounding words can inadvertently be transcribed and may not be corrected upon review.       

## 2019-05-05 NOTE — Progress Notes (Signed)
OK to treat without labs prior to treatment per Dr. Julien Nordmann, but will need labs done today peripherally

## 2019-05-05 NOTE — Patient Instructions (Signed)
[facility] Discharge Instructions for Patients Receiving Chemotherapy  Today you received the following chemotherapy agents: Imfinzi.  To help prevent nausea and vomiting after your treatment, we encourage you to take your nausea medication as directed.   If you develop nausea and vomiting that is not controlled by your nausea medication, call the clinic.   BELOW ARE SYMPTOMS THAT SHOULD BE REPORTED IMMEDIATELY:  *FEVER GREATER THAN 100.5 F  *CHILLS WITH OR WITHOUT FEVER  NAUSEA AND VOMITING THAT IS NOT CONTROLLED WITH YOUR NAUSEA MEDICATION  *UNUSUAL SHORTNESS OF BREATH  *UNUSUAL BRUISING OR BLEEDING  TENDERNESS IN MOUTH AND THROAT WITH OR WITHOUT PRESENCE OF ULCERS  *URINARY PROBLEMS  *BOWEL PROBLEMS  UNUSUAL RASH Items with * indicate a potential emergency and should be followed up as soon as possible.  Feel free to call the clinic should you have any questions or concerns. The clinic phone number is [phone].  Please show the CHEMO ALERT CARD at check-in to the Emergency Department and triage nurse.   

## 2019-05-05 NOTE — Progress Notes (Signed)
IV Infiltration during Imfinzi infusion. Sandi Mealy Called to bedside to assess the patient. Pt denies pain at IV site.

## 2019-05-05 NOTE — Progress Notes (Unsigned)
-  port a cath problem- I was able to remove 2 ml of alteplase with blood  ,but no more. Port still will not aspirate.

## 2019-05-05 NOTE — Progress Notes (Signed)
Port Problem  Alteplase dwell time over 2 hours and was able to remove the alteplase but no other blood would aspirate. Mohamed aware and will order dye study.   Bleeding on Blood thinner- pt had several needle sticks for labs and 2 IV today. She is on Plavix and pressure and pressure dressing  had to be applied for 3 minutes to stop bleeding after IVs removed. Pt instructed to call for further bleeding and to f/u with Dr Redmond School.

## 2019-05-06 ENCOUNTER — Telehealth: Payer: Self-pay | Admitting: Internal Medicine

## 2019-05-06 ENCOUNTER — Other Ambulatory Visit: Payer: Self-pay | Admitting: Medical Oncology

## 2019-05-06 DIAGNOSIS — Z95828 Presence of other vascular implants and grafts: Secondary | ICD-10-CM

## 2019-05-06 LAB — CYTOLOGY - NON PAP

## 2019-05-06 NOTE — Progress Notes (Signed)
   DATE: 05/05/2019       IV EXTRAVASATION (IRRITANT)  MD: Dr. Fanny Bien. Mohamed  AGENT RECEIVED AT TIME OF EXTRAVASATION:   Imfinzi  IV SITE LOCATION: Right proximal forearm  INTERVENTION:  1) IV stopped  2) 0 ml liquid/blood aspirated from IV site.  3) Patient Teaching Instruction Sheet reviewed with [name].  A) Apply cold to area for 15 to 20 minutes 4 times daily for the next 48 hours B) Elevate the affected site for the next 48 hours. C) [name] was instructed to call [phone] if she has questions or notes acute changes to the area.  Review of Systems  Constitutional: Negative for chills, diaphoresis and fever.  HENT: Negative for trouble swallowing and voice change.   Respiratory: Negative for cough, chest tightness, shortness of breath and wheezing.   Cardiovascular: Negative for chest pain and palpitations.  Gastrointestinal: Negative for abdominal pain, constipation, diarrhea, nausea and vomiting.  Musculoskeletal: Negative for back pain and myalgias.  Skin:       Mild swelling proximal to the site of a previously placed IV in the right antecubital fossa.   Neurological: Negative for dizziness, light-headedness and headaches.    Physical Exam Constitutional:      General: She is not in acute distress.    Appearance: Normal appearance. She is not ill-appearing, toxic-appearing or diaphoretic.  HENT:     Head: Normocephalic and atraumatic.  Skin:    General: Skin is warm and dry.     Comments: Mild swelling proximal to the site of a previously placed IV in the right antecubital fossa.  Neurological:     Mental Status: She is alert.  Psychiatric:        Mood and Affect: Mood normal.        Behavior: Behavior normal.        Thought Content: Thought content normal.        Judgment: Judgment normal.        Sandi Mealy, MHS, PA-C

## 2019-05-06 NOTE — Telephone Encounter (Signed)
Per 10/20 los appts already scheduled.

## 2019-05-08 ENCOUNTER — Other Ambulatory Visit (HOSPITAL_COMMUNITY): Payer: Self-pay | Admitting: Interventional Radiology

## 2019-05-08 ENCOUNTER — Ambulatory Visit (HOSPITAL_COMMUNITY)
Admission: RE | Admit: 2019-05-08 | Discharge: 2019-05-08 | Disposition: A | Discharge status: Home / Self Care | Payer: Medicare Other | Source: Ambulatory Visit | Attending: Internal Medicine | Admitting: Internal Medicine

## 2019-05-08 ENCOUNTER — Other Ambulatory Visit: Payer: Self-pay

## 2019-05-08 ENCOUNTER — Other Ambulatory Visit: Payer: Self-pay | Admitting: Family Medicine

## 2019-05-08 ENCOUNTER — Other Ambulatory Visit: Payer: Self-pay | Admitting: Internal Medicine

## 2019-05-08 ENCOUNTER — Encounter: Payer: Self-pay | Admitting: Interventional Radiology

## 2019-05-08 DIAGNOSIS — Y831 Surgical operation with implant of artificial internal device as the cause of abnormal reaction of the patient, or of later complication, without mention of misadventure at the time of the procedure: Secondary | ICD-10-CM | POA: Diagnosis not present

## 2019-05-08 DIAGNOSIS — T82594A Other mechanical complication of infusion catheter, initial encounter: Secondary | ICD-10-CM | POA: Diagnosis not present

## 2019-05-08 DIAGNOSIS — Z95828 Presence of other vascular implants and grafts: Secondary | ICD-10-CM

## 2019-05-08 DIAGNOSIS — E1169 Type 2 diabetes mellitus with other specified complication: Secondary | ICD-10-CM

## 2019-05-08 DIAGNOSIS — E785 Hyperlipidemia, unspecified: Secondary | ICD-10-CM

## 2019-05-08 HISTORY — PX: IR PATIENT EVAL TECH 0-60 MINS: IMG5564

## 2019-05-08 MED ORDER — HEPARIN SOD (PORK) LOCK FLUSH 100 UNIT/ML IV SOLN
INTRAVENOUS | Status: AC
  Filled 2019-05-08: qty 5

## 2019-05-08 MED ORDER — HEPARIN SOD (PORK) LOCK FLUSH 100 UNIT/ML IV SOLN
INTRAVENOUS | Status: DC | PRN
  Administered 2019-05-08: 500 [IU]

## 2019-05-08 MED ORDER — IOHEXOL 300 MG/ML  SOLN
50.0000 mL | Freq: Once | INTRAMUSCULAR | Status: AC | PRN
  Administered 2019-05-08: 09:00:00 10 mL via INTRAVENOUS

## 2019-05-08 NOTE — Procedures (Signed)
Patient came in today with complaint of port with no blood return.  Rema Fendt RT used multiple 3 cc and 10 cc syringes flush injections per Dr Katrinka Blazing direction.  He was able to get the the port to aspirate.  Images were attempted but of poor quality.  The port was flushed with 500 u Heparin and de-accessed.  Patient is scheduled for the following week for repeat injection and possible intervention.

## 2019-05-11 ENCOUNTER — Other Ambulatory Visit: Payer: Self-pay | Admitting: Radiology

## 2019-05-13 ENCOUNTER — Other Ambulatory Visit: Payer: Self-pay

## 2019-05-13 ENCOUNTER — Other Ambulatory Visit (HOSPITAL_COMMUNITY): Payer: Self-pay | Admitting: Interventional Radiology

## 2019-05-13 ENCOUNTER — Ambulatory Visit (HOSPITAL_COMMUNITY)
Admission: RE | Admit: 2019-05-13 | Discharge: 2019-05-13 | Disposition: A | Discharge status: Home / Self Care | Payer: Medicare Other | Source: Ambulatory Visit | Attending: Interventional Radiology | Admitting: Interventional Radiology

## 2019-05-13 ENCOUNTER — Encounter (HOSPITAL_COMMUNITY): Payer: Self-pay

## 2019-05-13 DIAGNOSIS — Z79899 Other long term (current) drug therapy: Secondary | ICD-10-CM | POA: Diagnosis not present

## 2019-05-13 DIAGNOSIS — K219 Gastro-esophageal reflux disease without esophagitis: Secondary | ICD-10-CM | POA: Insufficient documentation

## 2019-05-13 DIAGNOSIS — E1140 Type 2 diabetes mellitus with diabetic neuropathy, unspecified: Secondary | ICD-10-CM | POA: Insufficient documentation

## 2019-05-13 DIAGNOSIS — E785 Hyperlipidemia, unspecified: Secondary | ICD-10-CM | POA: Insufficient documentation

## 2019-05-13 DIAGNOSIS — Z452 Encounter for adjustment and management of vascular access device: Secondary | ICD-10-CM | POA: Insufficient documentation

## 2019-05-13 DIAGNOSIS — C3491 Malignant neoplasm of unspecified part of right bronchus or lung: Secondary | ICD-10-CM | POA: Diagnosis not present

## 2019-05-13 DIAGNOSIS — R569 Unspecified convulsions: Secondary | ICD-10-CM | POA: Diagnosis not present

## 2019-05-13 DIAGNOSIS — Z7984 Long term (current) use of oral hypoglycemic drugs: Secondary | ICD-10-CM | POA: Diagnosis not present

## 2019-05-13 DIAGNOSIS — Z95828 Presence of other vascular implants and grafts: Secondary | ICD-10-CM

## 2019-05-13 DIAGNOSIS — I10 Essential (primary) hypertension: Secondary | ICD-10-CM | POA: Insufficient documentation

## 2019-05-13 DIAGNOSIS — Z7902 Long term (current) use of antithrombotics/antiplatelets: Secondary | ICD-10-CM | POA: Insufficient documentation

## 2019-05-13 DIAGNOSIS — Z8673 Personal history of transient ischemic attack (TIA), and cerebral infarction without residual deficits: Secondary | ICD-10-CM | POA: Insufficient documentation

## 2019-05-13 HISTORY — PX: IR CV LINE INJECTION: IMG2294

## 2019-05-13 LAB — CBC WITH DIFFERENTIAL/PLATELET
Abs Immature Granulocytes: 0.05 10*3/uL (ref 0.00–0.07)
Basophils Absolute: 0.1 10*3/uL (ref 0.0–0.1)
Basophils Relative: 0 %
Eosinophils Absolute: 0.3 10*3/uL (ref 0.0–0.5)
Eosinophils Relative: 3 %
HCT: 37.9 % (ref 36.0–46.0)
Hemoglobin: 11.8 g/dL — ABNORMAL LOW (ref 12.0–15.0)
Immature Granulocytes: 0 %
Lymphocytes Relative: 11 %
Lymphs Abs: 1.2 10*3/uL (ref 0.7–4.0)
MCH: 29.2 pg (ref 26.0–34.0)
MCHC: 31.1 g/dL (ref 30.0–36.0)
MCV: 93.8 fL (ref 80.0–100.0)
Monocytes Absolute: 0.8 10*3/uL (ref 0.1–1.0)
Monocytes Relative: 7 %
Neutro Abs: 9 10*3/uL — ABNORMAL HIGH (ref 1.7–7.7)
Neutrophils Relative %: 79 %
Platelets: 502 10*3/uL — ABNORMAL HIGH (ref 150–400)
RBC: 4.04 MIL/uL (ref 3.87–5.11)
RDW: 14 % (ref 11.5–15.5)
WBC: 11.5 10*3/uL — ABNORMAL HIGH (ref 4.0–10.5)
nRBC: 0 % (ref 0.0–0.2)

## 2019-05-13 LAB — GLUCOSE, CAPILLARY: Glucose-Capillary: 203 mg/dL — ABNORMAL HIGH (ref 70–99)

## 2019-05-13 LAB — PROTIME-INR
INR: 0.9 (ref 0.8–1.2)
Prothrombin Time: 11.8 seconds (ref 11.4–15.2)

## 2019-05-13 MED ORDER — HEPARIN SOD (PORK) LOCK FLUSH 100 UNIT/ML IV SOLN
INTRAVENOUS | Status: AC
  Filled 2019-05-13: qty 5

## 2019-05-13 MED ORDER — LIDOCAINE-EPINEPHRINE 1 %-1:100000 IJ SOLN
INTRAMUSCULAR | Status: AC
  Filled 2019-05-13: qty 1

## 2019-05-13 MED ORDER — LIDOCAINE HCL 1 % IJ SOLN
INTRAMUSCULAR | Status: AC
  Filled 2019-05-13: qty 20

## 2019-05-13 MED ORDER — SODIUM CHLORIDE 0.9 % IV SOLN
INTRAVENOUS | Status: DC
  Administered 2019-05-13: 09:00:00 via INTRAVENOUS

## 2019-05-13 MED ORDER — CEFAZOLIN SODIUM-DEXTROSE 2-4 GM/100ML-% IV SOLN: 2.0000 g | INTRAVENOUS | Status: DC

## 2019-05-13 MED ORDER — IOHEXOL 300 MG/ML  SOLN
50.0000 mL | Freq: Once | INTRAMUSCULAR | Status: AC | PRN
  Administered 2019-05-13: 20 mL via INTRAVENOUS

## 2019-05-13 NOTE — Procedures (Signed)
Interventional Radiology Procedure Note  Procedure: Port injection  Complications: None  Estimated Blood Loss: None  Recommendations: - Port working well, no intervention at this time  Signed,  Criselda Peaches, MD

## 2019-05-13 NOTE — H&P (Signed)
Referring Physician(s): Mohamed,M  Supervising Physician: Jacqulynn Cadet  Patient Status:  [facility]  Chief Complaint: "My port is not working well"   Subjective: Patient familiar to IR service from right lung mass biopsy in 2018 and subsequent chest tube placement secondary to pneumothorax, Port-A-Cath placement on 09/15/2018 and right thoracentesis on 05/01/2019.  She has a history of recurrent non-small cell right lung cancer. There has been difficulty with aspirating from patient's Port-A-Cath and she presents today for port injection and possible revision/additional endovascular intervention if necessary.  She currently denies fever, headache, chest pain, abdominal pain, vomiting or bleeding.  She continues to smoke and has some dyspnea with exertion, occasional cough, back pain and intermittent nausea.  Past Medical History:  Diagnosis Date  . Cancer ([place])    Lung   . Carotid artery occlusion   . Clostridium difficile infection 06/2013  . Diabetes mellitus    takes Janumet daily  . Dyslipidemia    takes Crestor daily  . Gallstones   . GERD (gastroesophageal reflux disease)   . Heart murmur    hx of  . History of radiation therapy 06/11/17-06/21/17   right lung 50 Gy in 5 fractions  . Hypertension    takes Prinzide and Verapamil daily  . Impaired speech    from stroke  . Neuropathy, diabetic ([place])   . Pneumonia   . PONV (postoperative nausea and vomiting)   . Seizures ([place]) 04/19/2018   recent hospitalization  . Smoker   . Stroke ([place]) 06/25/13  . Vertigo    but doesn't take any meds   Past Surgical History:  Procedure Laterality Date  . cataract removed Right   . CHOLECYSTECTOMY    . COLONOSCOPY    . ENDARTERECTOMY Left 07/14/2013   Procedure: ENDARTERECTOMY CAROTID-LEFT;  Surgeon: Elam Dutch, MD;  Location: [facility];  Service: Vascular;  Laterality: Left;  . EYE SURGERY Left    cataract  . IR IMAGING GUIDED PORT INSERTION  09/15/2018  . IR PATIENT EVAL  TECH 0-60 MINS  05/08/2019  . IR PERC PLEURAL DRAIN W/INDWELL CATH W/IMG GUIDE  05/23/2017  . KNEE SURGERY Left 77yr ago  . LAPAROSCOPIC PARTIAL COLECTOMY N/A 09/02/2015   Procedure: LAPAROSCOPIC PARTIAL RIGHT COLECTOMY;  Surgeon: ALeighton Ruff MD;  [facility];  Service: General;  Laterality: N/A;  . PATCH ANGIOPLASTY Left 07/14/2013   Procedure: LEFT CAROTID ARTERY PATCH ANGIOPLASTY;  Surgeon: CElam Dutch MD;  [facility]  Service: Vascular;  Laterality: Left;  .Marland KitchenVIDEO BRONCHOSCOPY N/A 06/04/2018   Procedure: VIDEO BRONCHOSCOPY;  Surgeon: HMelrose Nakayama MD;  [facility];  Service: Thoracic;  Laterality: N/A;  . wisdom        Allergies: Codeine, Benadryl [diphenhydramine], Lipitor [atorvastatin], and Phenergan [promethazine hcl]  Medications: Prior to Admission medications   Medication Sig Start Date End Date Taking? Authorizing Provider  Blood Glucose Monitoring Suppl (ONE TOUCH ULTRA 2) w/Device KIT 1 Device by Does not apply route 2 (two) times daily. 08/21/17  Yes LDenita Lung MD  divalproex (DEPAKOTE ER) 500 MG 24 hr tablet Take one tablet daily 03/02/19  Yes SGarvin Fila MD  glucose blood (ONE TOUCH ULTRA TEST) test strip Use as instructed 02/10/18  Yes LDenita Lung MD  Lancets ([facility]) lancets Use as instructed 08/21/17  Yes LDenita Lung MD  lidocaine-prilocaine (EMLA) cream APPLY TO THE AFFECTED AREA DAILY AS NEEDED Patient taking differently: Apply 1 application topically daily as needed (  Pain).  03/20/19  Yes Curt Bears, MD  lisinopril (PRINIVIL,ZESTRIL) 10 MG tablet Take 1 tablet (10 mg total) by mouth daily. 06/05/18  Yes [name], MD  Multiple Vitamins-Minerals (MULTIVITAMIN PO) Take 1 tablet by mouth daily.   Yes [provider]  pioglitazone (ACTOS) 30 MG tablet Take 1 tablet (30 mg total) by mouth daily. 02/23/19  Yes [name], MD  rosuvastatin (CRESTOR) 20 MG tablet TAKE 1 TABLET BY MOUTH ONCE  DAILY 05/08/19  Yes [name], MD  SitaGLIPtin-MetFORMIN HCl (JANUMET XR) 50-1000 MG TB24 Take 1 tablet by mouth daily. 12/12/18  Yes [name], MD  vitamin B-12 (CYANOCOBALAMIN) 1000 MCG tablet Take 1,000 mcg by mouth daily.   Yes [provider]  aspirin EC 81 MG tablet Take 81 mg by mouth daily.    [provider]  clopidogrel (PLAVIX) 75 MG tablet Take 1 tablet (75 mg total) by mouth daily. 12/19/18   [name], MD  prochlorperazine (COMPAZINE) 10 MG tablet Take 1 tablet (10 mg total) by mouth every 6 (six) hours as needed for nausea or vomiting. Patient not taking: Reported on 04/13/2019 06/09/18   Maryanna Shape, NP     Vital Signs: BP (!) 170/52 (BP Location: Left Arm)   Pulse 86   Temp 97.8 F (36.6 C) (Oral)   Resp 18   SpO2 95%   Physical Exam awake, alert.  Chest with distant breath sounds bilaterally.  Heart with regular rate and rhythm.  Abdomen soft, positive bowel sounds, nontender.  No significant lower extremity edema.  Imaging: No results found.  Labs:  CBC: Recent Labs    04/13/19 2003 04/21/19 1407 05/05/19 1620 05/13/19 0846  WBC 9.0 10.3 9.5 11.5*  HGB 11.0* 11.5* 11.2* 11.8*  HCT 35.2* 36.1 35.0* 37.9  PLT 439* 439* 431* 502*    COAGS: Recent Labs    06/02/18 1540 09/15/18 1200 05/13/19 0846  INR 1.06 0.9 0.9  APTT 30  --   --     BMP: Recent Labs    04/07/19 1320 04/13/19 2003 04/21/19 1407 05/05/19 1620  NA 140 140 139 141  K 3.9 4.1 4.1 3.9  CL 102 105 102 101  CO2 _0 GLUCOSE 264* 147* 141* 181*  BUN _1 CALCIUM 9.3 9.2 9.6 9.6  CREATININE 0.99 0.84 0.89 0.96  GFRNONAA 55* >60 >60 57*  GFRAA >60 >60 >60 >60    LIVER FUNCTION TESTS: Recent Labs    03/25/19 1232 04/07/19 1320 04/21/19 1407 05/05/19 1620  BILITOT 0.3 0.4 0.4 0.4  AST 14* 12* 12* 11*  ALT _2 ALKPHOS 58 57 64 60  PROT 6.7 6.4* 6.9 6.7  ALBUMIN 3.4* 3.1* 3.4* 3.1*    Assessment and  Plan: Patient with history of recurrent non-small cell right lung cancer, previous Port-A-Cath placement on 09/15/2018, currently with difficulty aspirating from port.  Patient presents today for Port-A-Cath injection with possible revision or additional endovascular intervention.  Details/risks of procedure, including but not limited to, internal bleeding, infection, injury to adjacent structures discussed with patient with her understanding and consent.   Electronically Signed: D. Rowe Robert, PA-C 05/13/2019, 10:09 AM   I spent a total of 20 minutes at the the patient's bedside AND on the patient's hospital floor or unit, greater than 50% of which was counseling/coordinating care for Port-A-Cath injection with possible revision/additional endovascular intervention

## 2019-05-19 ENCOUNTER — Inpatient Hospital Stay (HOSPITAL_BASED_OUTPATIENT_CLINIC_OR_DEPARTMENT_OTHER): Payer: Medicare Other | Admitting: Internal Medicine

## 2019-05-19 ENCOUNTER — Encounter: Payer: Self-pay | Admitting: Internal Medicine

## 2019-05-19 ENCOUNTER — Other Ambulatory Visit: Payer: Self-pay

## 2019-05-19 ENCOUNTER — Inpatient Hospital Stay: Payer: Medicare Other

## 2019-05-19 ENCOUNTER — Inpatient Hospital Stay: Payer: Medicare Other | Attending: Internal Medicine

## 2019-05-19 VITALS — BP 138/60 | HR 88 | Temp 98.9°F | Resp 18 | Ht 66.0 in | Wt 145.0 lb

## 2019-05-19 DIAGNOSIS — E119 Type 2 diabetes mellitus without complications: Secondary | ICD-10-CM | POA: Diagnosis not present

## 2019-05-19 DIAGNOSIS — Z5112 Encounter for antineoplastic immunotherapy: Secondary | ICD-10-CM | POA: Diagnosis not present

## 2019-05-19 DIAGNOSIS — Z79899 Other long term (current) drug therapy: Secondary | ICD-10-CM | POA: Insufficient documentation

## 2019-05-19 DIAGNOSIS — C3491 Malignant neoplasm of unspecified part of right bronchus or lung: Secondary | ICD-10-CM

## 2019-05-19 DIAGNOSIS — Z7984 Long term (current) use of oral hypoglycemic drugs: Secondary | ICD-10-CM | POA: Diagnosis not present

## 2019-05-19 DIAGNOSIS — Z8673 Personal history of transient ischemic attack (TIA), and cerebral infarction without residual deficits: Secondary | ICD-10-CM | POA: Insufficient documentation

## 2019-05-19 DIAGNOSIS — C3411 Malignant neoplasm of upper lobe, right bronchus or lung: Secondary | ICD-10-CM | POA: Insufficient documentation

## 2019-05-19 DIAGNOSIS — Z923 Personal history of irradiation: Secondary | ICD-10-CM | POA: Insufficient documentation

## 2019-05-19 DIAGNOSIS — Z95828 Presence of other vascular implants and grafts: Secondary | ICD-10-CM

## 2019-05-19 DIAGNOSIS — C3492 Malignant neoplasm of unspecified part of left bronchus or lung: Secondary | ICD-10-CM

## 2019-05-19 DIAGNOSIS — Z87891 Personal history of nicotine dependence: Secondary | ICD-10-CM | POA: Diagnosis not present

## 2019-05-19 DIAGNOSIS — Z7982 Long term (current) use of aspirin: Secondary | ICD-10-CM | POA: Diagnosis not present

## 2019-05-19 DIAGNOSIS — I10 Essential (primary) hypertension: Secondary | ICD-10-CM | POA: Diagnosis not present

## 2019-05-19 LAB — CBC WITH DIFFERENTIAL (CANCER CENTER ONLY)
Abs Immature Granulocytes: 0.03 10*3/uL (ref 0.00–0.07)
Basophils Absolute: 0 10*3/uL (ref 0.0–0.1)
Basophils Relative: 0 %
Eosinophils Absolute: 0.3 10*3/uL (ref 0.0–0.5)
Eosinophils Relative: 3 %
HCT: 33.1 % — ABNORMAL LOW (ref 36.0–46.0)
Hemoglobin: 10.8 g/dL — ABNORMAL LOW (ref 12.0–15.0)
Immature Granulocytes: 0 %
Lymphocytes Relative: 16 %
Lymphs Abs: 1.6 10*3/uL (ref 0.7–4.0)
MCH: 29.5 pg (ref 26.0–34.0)
MCHC: 32.6 g/dL (ref 30.0–36.0)
MCV: 90.4 fL (ref 80.0–100.0)
Monocytes Absolute: 0.8 10*3/uL (ref 0.1–1.0)
Monocytes Relative: 8 %
Neutro Abs: 6.9 10*3/uL (ref 1.7–7.7)
Neutrophils Relative %: 73 %
Platelet Count: 482 10*3/uL — ABNORMAL HIGH (ref 150–400)
RBC: 3.66 MIL/uL — ABNORMAL LOW (ref 3.87–5.11)
RDW: 13.9 % (ref 11.5–15.5)
WBC Count: 9.5 10*3/uL (ref 4.0–10.5)
nRBC: 0 % (ref 0.0–0.2)

## 2019-05-19 LAB — CMP (CANCER CENTER ONLY)
ALT: 7 U/L (ref 0–44)
AST: 9 U/L — ABNORMAL LOW (ref 15–41)
Albumin: 3.1 g/dL — ABNORMAL LOW (ref 3.5–5.0)
Alkaline Phosphatase: 66 U/L (ref 38–126)
Anion gap: 9 (ref 5–15)
BUN: 15 mg/dL (ref 8–23)
CO2: 27 mmol/L (ref 22–32)
Calcium: 9.5 mg/dL (ref 8.9–10.3)
Chloride: 102 mmol/L (ref 98–111)
Creatinine: 0.89 mg/dL (ref 0.44–1.00)
GFR, Est AFR Am: >60 mL/min (ref >60)
GFR, Estimated: >60 mL/min (ref >60)
Glucose, Bld: 160 mg/dL — ABNORMAL HIGH (ref 70–99)
Potassium: 3.9 mmol/L (ref 3.5–5.1)
Sodium: 138 mmol/L (ref 135–145)
Total Bilirubin: 0.5 mg/dL (ref 0.3–1.2)
Total Protein: 6.5 g/dL (ref 6.5–8.1)

## 2019-05-19 LAB — TSH: TSH: 1.12 u[IU]/mL (ref 0.308–3.960)

## 2019-05-19 MED ORDER — SODIUM CHLORIDE 0.9 % IV SOLN
10.1000 mg/kg | Freq: Once | INTRAVENOUS | Status: AC
  Administered 2019-05-19: 620 mg via INTRAVENOUS
  Filled 2019-05-19: qty 10

## 2019-05-19 MED ORDER — SODIUM CHLORIDE 0.9% FLUSH
10.0000 mL | INTRAVENOUS | Status: DC | PRN
  Administered 2019-05-19: 14:00:00 10 mL
  Filled 2019-05-19: qty 10

## 2019-05-19 MED ORDER — HEPARIN SOD (PORK) LOCK FLUSH 100 UNIT/ML IV SOLN
500.0000 [IU] | Freq: Once | INTRAVENOUS | Status: AC | PRN
  Administered 2019-05-19: 500 [IU]
  Filled 2019-05-19: qty 5

## 2019-05-19 MED ORDER — SODIUM CHLORIDE 0.9% FLUSH
10.0000 mL | INTRAVENOUS | Status: DC | PRN
  Administered 2019-05-19: 10 mL
  Filled 2019-05-19: qty 10

## 2019-05-19 MED ORDER — SODIUM CHLORIDE 0.9 % IV SOLN
Freq: Once | INTRAVENOUS | Status: AC
  Administered 2019-05-19: 15:00:00 via INTRAVENOUS
  Filled 2019-05-19: qty 250

## 2019-05-19 NOTE — Patient Instructions (Signed)

## 2019-05-19 NOTE — Patient Instructions (Signed)
[facility] Discharge Instructions for Patients Receiving Chemotherapy  Today you received the following chemotherapy agents: Imfinzi.  To help prevent nausea and vomiting after your treatment, we encourage you to take your nausea medication as directed.   If you develop nausea and vomiting that is not controlled by your nausea medication, call the clinic.   BELOW ARE SYMPTOMS THAT SHOULD BE REPORTED IMMEDIATELY:  *FEVER GREATER THAN 100.5 F  *CHILLS WITH OR WITHOUT FEVER  NAUSEA AND VOMITING THAT IS NOT CONTROLLED WITH YOUR NAUSEA MEDICATION  *UNUSUAL SHORTNESS OF BREATH  *UNUSUAL BRUISING OR BLEEDING  TENDERNESS IN MOUTH AND THROAT WITH OR WITHOUT PRESENCE OF ULCERS  *URINARY PROBLEMS  *BOWEL PROBLEMS  UNUSUAL RASH Items with * indicate a potential emergency and should be followed up as soon as possible.  Feel free to call the clinic should you have any questions or concerns. The clinic phone number is [phone].  Please show the CHEMO ALERT CARD at check-in to the Emergency Department and triage nurse.   

## 2019-05-19 NOTE — Progress Notes (Signed)
[place] Telephone:([phone]) [phone]   Fax:([phone]  OFFICE PROGRESS NOTE  [name], MD [place] [address]  DIAGNOSIS:Recurrent non-small cell lung cancer likely squamous cell carcinoma that was initially diagnosed as a stage IIA(T2b, N0, M0) inAugust 2018status post curative stereotactic radiotherapy completed June 21, 2017. The patient presented today with concerning findings for disease recurrence and progression with enlarging and hypermetabolic right upper lobe lung mass presenting as stage IIIa (T3, N0, M0).  PDL 1 expression 0%.  PRIOR THERAPY: 1) Curative stereotactic radiotherapy completed on June 21, 2017. 2) A course of concurrent chemoradiation with chemotherapy consisting of carboplatin for an AUC of 2 and paclitaxel 45 mg/m.  First dose started on 06/23/2018.  Status post 7 cycles.  Last dose of chemotherapy was given August 04, 2018.  CURRENT THERAPY:Consolidation immunotherapy with Imfinzi 10 mg/KG every 2 weeks.  First dose September 09, 2018.  Status post 18 cycles.  INTERVAL HISTORY: [name] 78 y.o. female returns to the clinic today for follow-up visit.  The patient is feeling fine today with no concerning complaints except for shortness of breath with exertion.  She underwent ultrasound-guided right thoracentesis on 05/01/2019 with drainage of 1 L of pleural fluid.  The final cytology showed no malignant cells.  The patient denied having any current chest pain but has mild cough with no hemoptysis.  She denied having any fever or chills.  She has no nausea, vomiting, diarrhea or constipation.  She has no headache or visual changes.  She is here today for evaluation before starting cycle #19 of her treatment.  MEDICAL HISTORY: Past Medical History:  Diagnosis Date  . Cancer ([place])    Lung   . Carotid artery occlusion   . Clostridium difficile infection 06/2013  . Diabetes mellitus     takes Janumet daily  . Dyslipidemia    takes Crestor daily  . Gallstones   . GERD (gastroesophageal reflux disease)   . Heart murmur    hx of  . History of radiation therapy 06/11/17-06/21/17   right lung 50 Gy in 5 fractions  . Hypertension    takes Prinzide and Verapamil daily  . Impaired speech    from stroke  . Neuropathy, diabetic ([place])   . Pneumonia   . PONV (postoperative nausea and vomiting)   . Seizures ([place]) 04/19/2018   recent hospitalization  . Smoker   . Stroke ([place]) 06/25/13  . Vertigo    but doesn't take any meds    ALLERGIES:  is allergic to codeine; benadryl [diphenhydramine]; lipitor [atorvastatin]; and phenergan [promethazine hcl].  MEDICATIONS:  Current Outpatient Medications  Medication Sig Dispense Refill  . aspirin EC 81 MG tablet Take 81 mg by mouth daily.    . Blood Glucose Monitoring Suppl (ONE TOUCH ULTRA 2) w/Device KIT 1 Device by Does not apply route 2 (two) times daily. 1 each 0  . clopidogrel (PLAVIX) 75 MG tablet Take 1 tablet (75 mg total) by mouth daily. 90 tablet 3  . divalproex (DEPAKOTE ER) 500 MG 24 hr tablet Take one tablet daily 30 tablet 6  . glucose blood (ONE TOUCH ULTRA TEST) test strip Use as instructed 100 each 12  . Lancets (ONETOUCH ULTRASOFT) lancets Use as instructed 100 each 12  . lidocaine-prilocaine (EMLA) cream APPLY TO THE AFFECTED AREA DAILY AS NEEDED (Patient taking differently: Apply 1 application topically daily as needed (Pain). ) 30 g 0  . lisinopril (PRINIVIL,ZESTRIL) 10  MG tablet Take 1 tablet (10 mg total) by mouth daily. 90 tablet 3  . Multiple Vitamins-Minerals (MULTIVITAMIN PO) Take 1 tablet by mouth daily.    . pioglitazone (ACTOS) 30 MG tablet Take 1 tablet (30 mg total) by mouth daily. 90 tablet 1  . prochlorperazine (COMPAZINE) 10 MG tablet Take 1 tablet (10 mg total) by mouth every 6 (six) hours as needed for nausea or vomiting. (Patient not taking: Reported on 04/13/2019) 30 tablet 0  . rosuvastatin  (CRESTOR) 20 MG tablet TAKE 1 TABLET BY MOUTH ONCE DAILY 180 tablet 0  . SitaGLIPtin-MetFORMIN HCl (JANUMET XR) 50-1000 MG TB24 Take 1 tablet by mouth daily. 60 tablet 5  . vitamin B-12 (CYANOCOBALAMIN) 1000 MCG tablet Take 1,000 mcg by mouth daily.     No current facility-administered medications for this visit.    Facility-Administered Medications Ordered in Other Visits  Medication Dose Route Frequency Provider Last Rate Last Dose  . heparin lock flush 100 unit/mL  500 Units Intracatheter Once PRN Curt Bears, MD      . sodium chloride flush (NS) 0.9 % injection 10 mL  10 mL Intracatheter PRN Curt Bears, MD        SURGICAL HISTORY:  Past Surgical History:  Procedure Laterality Date  . cataract removed Right   . CHOLECYSTECTOMY    . COLONOSCOPY    . ENDARTERECTOMY Left 07/14/2013   Procedure: ENDARTERECTOMY CAROTID-LEFT;  Surgeon: Elam Dutch, MD;  Location: [facility];  Service: Vascular;  Laterality: Left;  . EYE SURGERY Left    cataract  . IR CV LINE INJECTION  05/13/2019  . IR IMAGING GUIDED PORT INSERTION  09/15/2018  . IR PATIENT EVAL TECH 0-60 MINS  05/08/2019  . IR PERC PLEURAL DRAIN W/INDWELL CATH W/IMG GUIDE  05/23/2017  . KNEE SURGERY Left 29yr ago  . LAPAROSCOPIC PARTIAL COLECTOMY N/A 09/02/2015   Procedure: LAPAROSCOPIC PARTIAL RIGHT COLECTOMY;  Surgeon: ALeighton Ruff MD;  [facility];  Service: General;  Laterality: N/A;  . PATCH ANGIOPLASTY Left 07/14/2013   Procedure: LEFT CAROTID ARTERY PATCH ANGIOPLASTY;  Surgeon: CElam Dutch MD;  [facility]  Service: Vascular;  Laterality: Left;  .Marland KitchenVIDEO BRONCHOSCOPY N/A 06/04/2018   Procedure: VIDEO BRONCHOSCOPY;  Surgeon: HMelrose Nakayama MD;  [facility];  Service: Thoracic;  Laterality: N/A;  . wisdom      REVIEW OF SYSTEMS:  A comprehensive review of systems was negative except for: Respiratory: positive for dyspnea on exertion   PHYSICAL EXAMINATION: General appearance: alert,  cooperative and no distress Head: Normocephalic, without obvious abnormality, atraumatic Neck: no adenopathy, no JVD, supple, symmetrical, trachea midline and thyroid not enlarged, symmetric, no tenderness/mass/nodules Lymph nodes: Cervical, supraclavicular, and axillary nodes normal. Resp: clear to auscultation bilaterally Back: symmetric, no curvature. ROM normal. No CVA tenderness. Cardio: regular rate and rhythm, S1, S2 normal, no murmur, click, rub or gallop GI: soft, non-tender; bowel sounds normal; no masses,  no organomegaly Extremities: extremities normal, atraumatic, no cyanosis or edema  ECOG PERFORMANCE STATUS: 1 - Symptomatic but completely ambulatory  Blood pressure 138/60, pulse 88, temperature 98.9 F (37.2 C), temperature source Temporal, resp. rate 18, height '5\' 6"'  (1.676 m), weight 145 lb (65.8 kg), SpO2 96 %.  LABORATORY DATA: Lab Results  Component Value Date   WBC 9.5 05/19/2019   HGB 10.8 (L) 05/19/2019   HCT 33.1 (L) 05/19/2019   MCV 90.4 05/19/2019   PLT 482 (H) 05/19/2019      Chemistry  Component Value Date/Time   NA 141 05/05/2019 1620   NA 139 05/07/2018 1147   K 3.9 05/05/2019 1620   CL 101 05/05/2019 1620   CO2 27 05/05/2019 1620   BUN 17 05/05/2019 1620   BUN 15 05/07/2018 1147   CREATININE 0.96 05/05/2019 1620   CREATININE 0.87 03/08/2015 0001      Component Value Date/Time   CALCIUM 9.6 05/05/2019 1620   ALKPHOS 60 05/05/2019 1620   AST 11 (L) 05/05/2019 1620   ALT 7 05/05/2019 1620   BILITOT 0.4 05/05/2019 1620       RADIOGRAPHIC STUDIES: Dg Chest 1 View  Result Date: 05/01/2019 CLINICAL DATA:  Right-sided thoracentesis. EXAM: CHEST  1 VIEW COMPARISON:  CT 04/13/2019.  Chest x-ray 04/13/2019. FINDINGS: PowerPort catheter with tip over superior vena cava. Mediastinum and hilar structures stable. Heart size stable. Small right pleural effusion remains. No pneumothorax post thoracentesis. Right upper lung mass again noted. Known  left lung mass best visualized by prior CT. IMPRESSION: No evidence of pneumothorax post thoracentesis. Electronically Signed   By: Marcello Moores  Register   On: 05/01/2019 10:41   Ir Cv Line Injection  Result Date: 05/13/2019 INDICATION: 78 year old female with a port catheter which was placed by interventional radiology on 09/15/2018. She recently had issues with difficulty in aspiration of the port catheter and presents today for catheter injection under fluoroscopy. EXAM: CENTRAL VENOUS CATHETER MEDICATIONS: None ANESTHESIA/SEDATION: None. FLUOROSCOPY TIME:  Fluoroscopy Time: 0 minutes 6 seconds (216 mGy). COMPLICATIONS: None immediate. PROCEDURE: Informed written consent was obtained from the patient after a thorough discussion of the procedural risks, benefits and alternatives. All questions were addressed. A timeout was performed prior to the initiation of the procedure. The port catheter was sterilely accessed. Initial fluoroscopic imaging demonstrates a right IJ single-lumen power injectable port catheter. The catheter tip overlies the mid SVC. The port catheter aspirates easily. Catheter injection was then performed under digital subtraction angiography. Normal filling of the port catheter reservoir. No evidence of extravasation. Normal contrast exiting the catheter tip. No evidence of thrombus or fibrin sheath. The port catheter was subsequently flushed with saline and then heparinized saline. Port was de accessed. IMPRESSION: Right IJ approach single-lumen power injectable port catheter with the tip in the mid SVC. Port catheter flushes and aspirates easily. No evidence of fibrin sheath or other complication. Signed, Criselda Peaches, MD, [facility] Electronically Signed   By: Jacqulynn Cadet M.D.   On: 05/13/2019 15:31   Ir Patient Eval Tech 0-60 Mins  Result Date: 05/08/2019 Chipper Oman     05/08/2019  9:21 AM Patient came in  today with complaint of port with no blood return.  Rema Fendt RT used multiple 3 cc and 10 cc syringes flush injections per Dr Katrinka Blazing direction.  He was able to get the the port to aspirate.  Images were attempted but of poor quality.  The port was flushed with 500 u Heparin and de-accessed.  Patient is scheduled for the following week for repeat injection and possible intervention.  US Thoracentesis Asp Pleural Space W/img Guide  Result Date: 05/01/2019 INDICATION: Patient with history of lung cancer, dyspnea, right pleural effusion. Request made for diagnostic and therapeutic right thoracentesis. EXAM: ULTRASOUND GUIDED DIAGNOSTIC AND THERAPEUTIC RIGHT THORACENTESIS MEDICATIONS: None COMPLICATIONS: None immediate. PROCEDURE: An ultrasound guided thoracentesis was thoroughly discussed with the patient and questions answered. The benefits, risks, alternatives and complications were also discussed. The patient understands and wishes  to proceed with the procedure. Written consent was obtained. Ultrasound was performed to localize and mark an adequate pocket of fluid in the right chest. The area was then prepped and draped in the normal sterile fashion. 1% Lidocaine was used for local anesthesia. Under ultrasound guidance a 6 Fr Safe-T-Centesis catheter was introduced. Thoracentesis was performed. The catheter was removed and a dressing applied. FINDINGS: A total of approximately 1 liter of yellow fluid was removed. Samples were sent to the laboratory as requested by the clinical team. IMPRESSION: Successful ultrasound guided diagnostic and therapeutic right thoracentesis yielding 1 liter of pleural fluid. Read by: Rowe Robert, PA-C Electronically Signed   By: Jacqulynn Cadet M.D.   On: 05/01/2019 11:06    ASSESSMENT AND PLAN: This is a very pleasant 78 years old white female with recurrent non-small cell lung cancer presented as a stage IIIa involving the right lower lobe. The patient  underwent a course of concurrent chemoradiation with weekly carboplatin and paclitaxel status post 7 cycles.  She tolerated this treatment well. She had repeat CT scan of the chest performed recently.  I personally and independently reviewed the scan images and discussed the results with the patient and her daughter. Her scan showed improvement of the right lung mass with no other concerning findings for progression. The patient was started on treatment with consolidation immunotherapy with Imfinzi status post 18 cycles.   She has been tolerating this treatment well with no concerning adverse effects. I recommended for her to proceed with cycle #19 today as planned. I will see her back for follow-up visit in 2 weeks for evaluation before the next cycle of her treatment. The patient was advised to call immediately if she has any concerning symptoms in the interval. The patient voices understanding of current disease status and treatment options and is in agreement with the current care plan. All questions were answered. The patient knows to call the clinic with any problems, questions or concerns. We can certainly see the patient much sooner if necessary.  Disclaimer: This note was dictated with voice recognition software. Similar sounding words can inadvertently be transcribed and may not be corrected upon review.

## 2019-05-20 ENCOUNTER — Telehealth: Payer: Self-pay | Admitting: Internal Medicine

## 2019-05-20 NOTE — Telephone Encounter (Signed)
Scheduled per los. Called and spoke with patient. Confirmed appt 

## 2019-05-29 ENCOUNTER — Other Ambulatory Visit: Payer: Self-pay | Admitting: Family Medicine

## 2019-05-29 DIAGNOSIS — I152 Hypertension secondary to endocrine disorders: Secondary | ICD-10-CM

## 2019-05-29 DIAGNOSIS — E1159 Type 2 diabetes mellitus with other circulatory complications: Secondary | ICD-10-CM

## 2019-06-02 ENCOUNTER — Inpatient Hospital Stay: Payer: Medicare Other

## 2019-06-02 ENCOUNTER — Other Ambulatory Visit: Payer: Self-pay

## 2019-06-02 ENCOUNTER — Inpatient Hospital Stay (HOSPITAL_BASED_OUTPATIENT_CLINIC_OR_DEPARTMENT_OTHER): Payer: Medicare Other | Admitting: Physician Assistant

## 2019-06-02 VITALS — BP 158/82 | HR 83 | Temp 98.7°F | Resp 17 | Ht 66.0 in | Wt 143.5 lb

## 2019-06-02 DIAGNOSIS — Z95828 Presence of other vascular implants and grafts: Secondary | ICD-10-CM

## 2019-06-02 DIAGNOSIS — C3491 Malignant neoplasm of unspecified part of right bronchus or lung: Secondary | ICD-10-CM

## 2019-06-02 DIAGNOSIS — Z5112 Encounter for antineoplastic immunotherapy: Secondary | ICD-10-CM | POA: Diagnosis not present

## 2019-06-02 DIAGNOSIS — Z87891 Personal history of nicotine dependence: Secondary | ICD-10-CM | POA: Diagnosis not present

## 2019-06-02 DIAGNOSIS — C3411 Malignant neoplasm of upper lobe, right bronchus or lung: Secondary | ICD-10-CM | POA: Diagnosis not present

## 2019-06-02 DIAGNOSIS — Z7984 Long term (current) use of oral hypoglycemic drugs: Secondary | ICD-10-CM | POA: Diagnosis not present

## 2019-06-02 DIAGNOSIS — Z79899 Other long term (current) drug therapy: Secondary | ICD-10-CM | POA: Diagnosis not present

## 2019-06-02 DIAGNOSIS — Z8673 Personal history of transient ischemic attack (TIA), and cerebral infarction without residual deficits: Secondary | ICD-10-CM | POA: Diagnosis not present

## 2019-06-02 DIAGNOSIS — I10 Essential (primary) hypertension: Secondary | ICD-10-CM | POA: Diagnosis not present

## 2019-06-02 DIAGNOSIS — E119 Type 2 diabetes mellitus without complications: Secondary | ICD-10-CM | POA: Diagnosis not present

## 2019-06-02 DIAGNOSIS — Z923 Personal history of irradiation: Secondary | ICD-10-CM | POA: Diagnosis not present

## 2019-06-02 DIAGNOSIS — Z7982 Long term (current) use of aspirin: Secondary | ICD-10-CM | POA: Diagnosis not present

## 2019-06-02 LAB — CMP (CANCER CENTER ONLY)
ALT: 11 U/L (ref 0–44)
AST: 11 U/L — ABNORMAL LOW (ref 15–41)
Albumin: 3.4 g/dL — ABNORMAL LOW (ref 3.5–5.0)
Alkaline Phosphatase: 74 U/L (ref 38–126)
Anion gap: 12 (ref 5–15)
BUN: 14 mg/dL (ref 8–23)
CO2: 26 mmol/L (ref 22–32)
Calcium: 9.7 mg/dL (ref 8.9–10.3)
Chloride: 101 mmol/L (ref 98–111)
Creatinine: 0.88 mg/dL (ref 0.44–1.00)
GFR, Est AFR Am: >60 mL/min (ref >60)
GFR, Estimated: >60 mL/min (ref >60)
Glucose, Bld: 204 mg/dL — ABNORMAL HIGH (ref 70–99)
Potassium: 3.6 mmol/L (ref 3.5–5.1)
Sodium: 139 mmol/L (ref 135–145)
Total Bilirubin: 0.4 mg/dL (ref 0.3–1.2)
Total Protein: 6.8 g/dL (ref 6.5–8.1)

## 2019-06-02 LAB — CBC WITH DIFFERENTIAL (CANCER CENTER ONLY)
Abs Immature Granulocytes: 0.03 10*3/uL (ref 0.00–0.07)
Basophils Absolute: 0 10*3/uL (ref 0.0–0.1)
Basophils Relative: 0 %
Eosinophils Absolute: 0.3 10*3/uL (ref 0.0–0.5)
Eosinophils Relative: 3 %
HCT: 36.5 % (ref 36.0–46.0)
Hemoglobin: 11.8 g/dL — ABNORMAL LOW (ref 12.0–15.0)
Immature Granulocytes: 0 %
Lymphocytes Relative: 15 %
Lymphs Abs: 1.5 10*3/uL (ref 0.7–4.0)
MCH: 29.6 pg (ref 26.0–34.0)
MCHC: 32.3 g/dL (ref 30.0–36.0)
MCV: 91.5 fL (ref 80.0–100.0)
Monocytes Absolute: 0.8 10*3/uL (ref 0.1–1.0)
Monocytes Relative: 8 %
Neutro Abs: 7.9 10*3/uL — ABNORMAL HIGH (ref 1.7–7.7)
Neutrophils Relative %: 74 %
Platelet Count: 501 10*3/uL — ABNORMAL HIGH (ref 150–400)
RBC: 3.99 MIL/uL (ref 3.87–5.11)
RDW: 14.3 % (ref 11.5–15.5)
WBC Count: 10.5 10*3/uL (ref 4.0–10.5)
nRBC: 0 % (ref 0.0–0.2)

## 2019-06-02 MED ORDER — SODIUM CHLORIDE 0.9% FLUSH
10.0000 mL | INTRAVENOUS | Status: DC | PRN
  Administered 2019-06-02: 14:00:00 10 mL
  Filled 2019-06-02: qty 10

## 2019-06-02 MED ORDER — SODIUM CHLORIDE 0.9% FLUSH
10.0000 mL | INTRAVENOUS | Status: DC | PRN
  Administered 2019-06-02: 10 mL
  Filled 2019-06-02: qty 10

## 2019-06-02 MED ORDER — HEPARIN SOD (PORK) LOCK FLUSH 100 UNIT/ML IV SOLN
500.0000 [IU] | Freq: Once | INTRAVENOUS | Status: AC | PRN
  Administered 2019-06-02: 500 [IU]
  Filled 2019-06-02: qty 5

## 2019-06-02 MED ORDER — SODIUM CHLORIDE 0.9 % IV SOLN
Freq: Once | INTRAVENOUS | Status: AC
  Administered 2019-06-02: 15:00:00 via INTRAVENOUS
  Filled 2019-06-02: qty 250

## 2019-06-02 MED ORDER — SODIUM CHLORIDE 0.9 % IV SOLN
620.0000 mg | Freq: Once | INTRAVENOUS | Status: AC
  Administered 2019-06-02: 16:00:00 620 mg via INTRAVENOUS
  Filled 2019-06-02: qty 10

## 2019-06-02 NOTE — Patient Instructions (Signed)
[facility] Discharge Instructions for Patients Receiving Chemotherapy  Today you received the following chemotherapy agents: durvalumab.  To help prevent nausea and vomiting after your treatment, we encourage you to take your nausea medication as directed.   If you develop nausea and vomiting that is not controlled by your nausea medication, call the clinic.   BELOW ARE SYMPTOMS THAT SHOULD BE REPORTED IMMEDIATELY:  *FEVER GREATER THAN 100.5 F  *CHILLS WITH OR WITHOUT FEVER  NAUSEA AND VOMITING THAT IS NOT CONTROLLED WITH YOUR NAUSEA MEDICATION  *UNUSUAL SHORTNESS OF BREATH  *UNUSUAL BRUISING OR BLEEDING  TENDERNESS IN MOUTH AND THROAT WITH OR WITHOUT PRESENCE OF ULCERS  *URINARY PROBLEMS  *BOWEL PROBLEMS  UNUSUAL RASH Items with * indicate a potential emergency and should be followed up as soon as possible.  Feel free to call the clinic should you have any questions or concerns. The clinic phone number is [phone].  Please show the CHEMO ALERT CARD at check-in to the Emergency Department and triage nurse.   

## 2019-06-02 NOTE — Progress Notes (Signed)
[facility] OFFICE PROGRESS NOTE  [name], [place] [address]  DIAGNOSIS: Recurrent non-small cell lung cancer likely squamous cell carcinoma that was initially diagnosed as a stage IIA(T2b, N0, M0) inAugust 2018status post curative stereotactic radiotherapy completed June 21, 2017. The patient presented today with concerning findings for disease recurrence and progression with enlarging and hypermetabolic right upper lobe lung mass presenting as stage IIIa (T3, N0, M0).  PDL 1 expression 0%.  PRIOR THERAPY: 1) Curative stereotactic radiotherapy completed on June 21, 2017. 2) A course of concurrent chemoradiation with chemotherapy consisting of carboplatin for an AUC of 2 and paclitaxel 45 mg/m.First dose started on 06/23/2018.Status post 7 cycles.  Last dose of chemotherapy was given August 04, 2018. 3) 3) SBRT to the enlarging left lower lobe pulmonary nodule under the care of Dr. Sondra Come. Last radiation treatment scheduled for 03/26/2019  CURRENT THERAPY: Consolidation immunotherapy with Imfinzi 10 mg/KG every 2 weeks.  First dose September 09, 2018.  Status post 19 cycles.  INTERVAL HISTORY: [name] 78 y.o. female returns to the clinic for a follow up visit. The patient is feeling well today without any concerning complaints. The patient continues to tolerate treatment with immunotherapy with Imfinzi well without any adverse side effects. Denies any fever, chills, night sweats, or weight loss. Denies any chest pain, cough, or hemoptysis. She reports her baseline shortness of breath. Denies any nausea, vomiting, diarrhea, or constipation. Denies any headache or visual changes. Denies any rashes or skin changes. The patient is here today for evaluation prior to starting cycle # 20  MEDICAL HISTORY: Past Medical History:  Diagnosis Date  . Cancer ([place])    Lung   . Carotid artery occlusion   . Clostridium difficile  infection 06/2013  . Diabetes mellitus    takes Janumet daily  . Dyslipidemia    takes Crestor daily  . Gallstones   . GERD (gastroesophageal reflux disease)   . Heart murmur    hx of  . History of radiation therapy 06/11/17-06/21/17   right lung 50 Gy in 5 fractions  . Hypertension    takes Prinzide and Verapamil daily  . Impaired speech    from stroke  . Neuropathy, diabetic ([place])   . Pneumonia   . PONV (postoperative nausea and vomiting)   . Seizures ([place]) 04/19/2018   recent hospitalization  . Smoker   . Stroke ([place]) 06/25/13  . Vertigo    but doesn't take any meds    ALLERGIES:  is allergic to codeine; benadryl [diphenhydramine]; lipitor [atorvastatin]; and phenergan [promethazine hcl].  MEDICATIONS:  Current Outpatient Medications  Medication Sig Dispense Refill  . aspirin EC 81 MG tablet Take 81 mg by mouth daily.    . Blood Glucose Monitoring Suppl (ONE TOUCH ULTRA 2) w/Device KIT 1 Device by Does not apply route 2 (two) times daily. 1 each 0  . clopidogrel (PLAVIX) 75 MG tablet Take 1 tablet (75 mg total) by mouth daily. 90 tablet 3  . divalproex (DEPAKOTE ER) 500 MG 24 hr tablet Take one tablet daily 30 tablet 6  . glucose blood (ONE TOUCH ULTRA TEST) test strip Use as instructed 100 each 12  . Lancets (ONETOUCH ULTRASOFT) lancets Use as instructed 100 each 12  . lidocaine-prilocaine (EMLA) cream APPLY TO THE AFFECTED AREA DAILY AS NEEDED (Patient taking differently: Apply 1 application topically daily as needed (Pain). ) 30 g 0  . lisinopril (ZESTRIL) 10 MG tablet TAKE 1 TABLET(10  MG) BY MOUTH DAILY 90 tablet 3  . Multiple Vitamins-Minerals (MULTIVITAMIN PO) Take 1 tablet by mouth daily.    . pioglitazone (ACTOS) 30 MG tablet Take 1 tablet (30 mg total) by mouth daily. 90 tablet 1  . rosuvastatin (CRESTOR) 20 MG tablet TAKE 1 TABLET BY MOUTH ONCE DAILY 180 tablet 0  . SitaGLIPtin-MetFORMIN HCl (JANUMET XR) 50-1000 MG TB24 Take 1 tablet by mouth daily. 60 tablet 5   . vitamin B-12 (CYANOCOBALAMIN) 1000 MCG tablet Take 1,000 mcg by mouth daily.    . prochlorperazine (COMPAZINE) 10 MG tablet Take 1 tablet (10 mg total) by mouth every 6 (six) hours as needed for nausea or vomiting. (Patient not taking: Reported on 04/13/2019) 30 tablet 0   No current facility-administered medications for this visit.    Facility-Administered Medications Ordered in Other Visits  Medication Dose Route Frequency Provider Last Rate Last Dose  . heparin lock flush 100 unit/mL  500 Units Intracatheter Once PRN Curt Bears, MD      . sodium chloride flush (NS) 0.9 % injection 10 mL  10 mL Intracatheter PRN Curt Bears, MD        SURGICAL HISTORY:  Past Surgical History:  Procedure Laterality Date  . cataract removed Right   . CHOLECYSTECTOMY    . COLONOSCOPY    . ENDARTERECTOMY Left 07/14/2013   Procedure: ENDARTERECTOMY CAROTID-LEFT;  Surgeon: Elam Dutch, MD;  Location: [facility];  Service: Vascular;  Laterality: Left;  . EYE SURGERY Left    cataract  . IR CV LINE INJECTION  05/13/2019  . IR IMAGING GUIDED PORT INSERTION  09/15/2018  . IR PATIENT EVAL TECH 0-60 MINS  05/08/2019  . IR PERC PLEURAL DRAIN W/INDWELL CATH W/IMG GUIDE  05/23/2017  . KNEE SURGERY Left 79yr ago  . LAPAROSCOPIC PARTIAL COLECTOMY N/A 09/02/2015   Procedure: LAPAROSCOPIC PARTIAL RIGHT COLECTOMY;  Surgeon: ALeighton Ruff MD;  Location[facility];  Service: General;  Laterality: N/A;  . PATCH ANGIOPLASTY Left 07/14/2013   Procedure: LEFT CAROTID ARTERY PATCH ANGIOPLASTY;  Surgeon: CElam Dutch MD;  [facility]  Service: Vascular;  Laterality: Left;  .Marland KitchenVIDEO BRONCHOSCOPY N/A 06/04/2018   Procedure: VIDEO BRONCHOSCOPY;  Surgeon: HMelrose Nakayama MD;  [facility];  Service: Thoracic;  Laterality: N/A;  . wisdom      REVIEW OF SYSTEMS:   Review of Systems  Constitutional: Negative for appetite change, chills, fatigue, fever and unexpected weight change.  HENT: Negative for  mouth sores, nosebleeds, sore throat and trouble swallowing.   Eyes: Negative for eye problems and icterus.  Respiratory: Positive for cough and dyspnea on exertion. Negative for hemoptysis and wheezing.   Cardiovascular: Negative for chest pain and leg swelling.  Gastrointestinal: Negative for abdominal pain, constipation, diarrhea, nausea and vomiting.  Genitourinary: Negative for bladder incontinence, difficulty urinating, dysuria, frequency and hematuria.   Musculoskeletal: Negative for back pain, gait problem, neck pain and neck stiffness.  Skin: Negative for itching and rash.  Neurological: Negative for dizziness, extremity weakness, gait problem, headaches, light-headedness and seizures.  Hematological: Negative for adenopathy. Does not bruise/bleed easily.  Psychiatric/Behavioral: Negative for confusion, depression and sleep disturbance. The patient is not nervous/anxious.     PHYSICAL EXAMINATION:  Blood pressure (!) 158/82, pulse 83, temperature 98.7 F (37.1 C), temperature source Temporal, resp. rate 17, height _0  (1.676 m), weight 143 lb 8 oz (65.1 kg), SpO2 100 %.  ECOG PERFORMANCE STATUS: 1 - Symptomatic but completely ambulatory  Physical Exam  Constitutional: Oriented to person, place, and time and well-developed, well-nourished, and in no distress. HENT:  Head: Normocephalic and atraumatic.  Mouth/Throat: Oropharynx is clear and moist. No oropharyngeal exudate.  Eyes: Conjunctivae are normal. Right eye exhibits no discharge. Left eye exhibits no discharge. No scleral icterus.  Neck: Normal range of motion. Neck supple.  Cardiovascular: Normal rate, regular rhythm, normal heart sounds and intact distal pulses.   Pulmonary/Chest: Effort normal. Decreased breath sounds in all lung fields. No respiratory distress. No wheezes. No rales.  Abdominal: Soft. Bowel sounds are normal. Exhibits no distension and no mass. There is no tenderness.  Musculoskeletal: Normal range of  motion. Exhibits no edema.  Lymphadenopathy:    No cervical adenopathy.  Neurological: Alert and oriented to person, place, and time. Exhibits normal muscle tone. Gait normal. Coordination normal.  Skin: Skin is warm and dry. No rash noted. Not diaphoretic. No erythema. No pallor.  Psychiatric: Mood, memory and judgment normal.  Vitals reviewed.  LABORATORY DATA: Lab Results  Component Value Date   WBC 10.5 06/02/2019   HGB 11.8 (L) 06/02/2019   HCT 36.5 06/02/2019   MCV 91.5 06/02/2019   PLT 501 (H) 06/02/2019      Chemistry      Component Value Date/Time   NA 139 06/02/2019 1346   NA 139 05/07/2018 1147   K 3.6 06/02/2019 1346   CL 101 06/02/2019 1346   CO2 26 06/02/2019 1346   BUN 14 06/02/2019 1346   BUN 15 05/07/2018 1147   CREATININE 0.88 06/02/2019 1346   CREATININE 0.87 03/08/2015 0001      Component Value Date/Time   CALCIUM 9.7 06/02/2019 1346   ALKPHOS 74 06/02/2019 1346   AST 11 (L) 06/02/2019 1346   ALT 11 06/02/2019 1346   BILITOT 0.4 06/02/2019 1346       RADIOGRAPHIC STUDIES:  Ir Cv Line Injection  Result Date: 05/13/2019 INDICATION: 78 year old female with a port catheter which was placed by interventional radiology on 09/15/2018. She recently had issues with difficulty in aspiration of the port catheter and presents today for catheter injection under fluoroscopy. EXAM: CENTRAL VENOUS CATHETER MEDICATIONS: None ANESTHESIA/SEDATION: None. FLUOROSCOPY TIME:  Fluoroscopy Time: 0 minutes 6 seconds (216 mGy). COMPLICATIONS: None immediate. PROCEDURE: Informed written consent was obtained from the patient after a thorough discussion of the procedural risks, benefits and alternatives. All questions were addressed. A timeout was performed prior to the initiation of the procedure. The port catheter was sterilely accessed. Initial fluoroscopic imaging demonstrates a right IJ single-lumen power injectable port catheter. The catheter tip overlies the mid SVC. The  port catheter aspirates easily. Catheter injection was then performed under digital subtraction angiography. Normal filling of the port catheter reservoir. No evidence of extravasation. Normal contrast exiting the catheter tip. No evidence of thrombus or fibrin sheath. The port catheter was subsequently flushed with saline and then heparinized saline. Port was de accessed. IMPRESSION: Right IJ approach single-lumen power injectable port catheter with the tip in the mid SVC. Port catheter flushes and aspirates easily. No evidence of fibrin sheath or other complication. Signed, Criselda Peaches, MD, [facility] Electronically Signed   By: Jacqulynn Cadet M.D.   On: 05/13/2019 15:31   Ir Patient Eval Tech 0-60 Mins  Result Date: 05/08/2019 Chipper O[place]     05/08/2019  9:21 AM Patient came in today with complaint of port with no blood return.  Rema Fendt RT used multiple 3 cc and  10 cc syringes flush injections per Dr McCullough's direction.  He was able to get the the port to aspirate.  Images were attempted but of poor quality.  The port was flushed with 500 u Heparin and de-accessed.  Patient is scheduled for the following week for repeat injection and possible intervention.    ASSESSMENT/PLAN:  This is a very pleasant 78 year old Caucasian female with recurrentstage IIIAnon-small cell lung cancer, squamous cell carcinomaof the right upper lobe. She presented initially as a stage IIa in August of 2018.  The patient underwent a course of concurrent chemoradiation with weekly carboplatin and paclitaxel. She status post 7 cycles. She tolerated treatment well.   She is currently undergoing consolidation immunotherapy with Imfinzi 10 mg/kg IV every 2 weeks. She is status post19 cycles. She continues to tolerate her treatment well without any adverse side effects.  She recently completed SBRT under the care of Dr. Sondra Come to  the enlarging left pulmonary nodule on 03/26/2019.   Labs were reviewed recommend that she proceed with cycle #20 today as scheduled.   We will see her back for a follow up visit in 2 weeks before starting cycle #21.   The patient was advised to call immediately if she has any concerning symptoms in the interval. The patient voices understanding of current disease status and treatment options and is in agreement with the current care plan. All questions were answered. The patient knows to call the clinic with any problems, questions or concerns. We can certainly see the patient much sooner if necessary   No orders of the defined types were placed in this encounter.     L , PA-C 06/02/19

## 2019-06-03 ENCOUNTER — Telehealth: Payer: Self-pay | Admitting: Physician Assistant

## 2019-06-03 NOTE — Telephone Encounter (Signed)
Scheduled per los. Called and left msg. Mailed printout  °

## 2019-06-08 ENCOUNTER — Other Ambulatory Visit: Payer: Self-pay

## 2019-06-08 ENCOUNTER — Other Ambulatory Visit (INDEPENDENT_AMBULATORY_CARE_PROVIDER_SITE_OTHER): Payer: Medicare Other

## 2019-06-08 DIAGNOSIS — E538 Deficiency of other specified B group vitamins: Secondary | ICD-10-CM | POA: Diagnosis not present

## 2019-06-08 MED ORDER — CYANOCOBALAMIN 1000 MCG/ML IJ SOLN
1000.0000 ug | Freq: Once | INTRAMUSCULAR | Status: AC
  Administered 2019-06-08: 15:00:00 1000 ug via INTRAMUSCULAR

## 2019-06-16 ENCOUNTER — Inpatient Hospital Stay (HOSPITAL_BASED_OUTPATIENT_CLINIC_OR_DEPARTMENT_OTHER): Payer: Medicare Other | Admitting: Physician Assistant

## 2019-06-16 ENCOUNTER — Inpatient Hospital Stay: Payer: Medicare Other

## 2019-06-16 ENCOUNTER — Inpatient Hospital Stay: Payer: Medicare Other | Attending: Internal Medicine

## 2019-06-16 ENCOUNTER — Other Ambulatory Visit: Payer: Self-pay

## 2019-06-16 VITALS — BP 138/73 | HR 95 | Temp 98.7°F | Resp 18 | Ht 66.0 in | Wt 142.4 lb

## 2019-06-16 DIAGNOSIS — Z923 Personal history of irradiation: Secondary | ICD-10-CM | POA: Diagnosis not present

## 2019-06-16 DIAGNOSIS — I10 Essential (primary) hypertension: Secondary | ICD-10-CM | POA: Diagnosis not present

## 2019-06-16 DIAGNOSIS — Z7984 Long term (current) use of oral hypoglycemic drugs: Secondary | ICD-10-CM | POA: Diagnosis not present

## 2019-06-16 DIAGNOSIS — C3411 Malignant neoplasm of upper lobe, right bronchus or lung: Secondary | ICD-10-CM | POA: Insufficient documentation

## 2019-06-16 DIAGNOSIS — Z5112 Encounter for antineoplastic immunotherapy: Secondary | ICD-10-CM

## 2019-06-16 DIAGNOSIS — Z8673 Personal history of transient ischemic attack (TIA), and cerebral infarction without residual deficits: Secondary | ICD-10-CM | POA: Diagnosis not present

## 2019-06-16 DIAGNOSIS — Z87891 Personal history of nicotine dependence: Secondary | ICD-10-CM | POA: Diagnosis not present

## 2019-06-16 DIAGNOSIS — Z7982 Long term (current) use of aspirin: Secondary | ICD-10-CM | POA: Insufficient documentation

## 2019-06-16 DIAGNOSIS — C3491 Malignant neoplasm of unspecified part of right bronchus or lung: Secondary | ICD-10-CM

## 2019-06-16 DIAGNOSIS — E785 Hyperlipidemia, unspecified: Secondary | ICD-10-CM | POA: Diagnosis not present

## 2019-06-16 DIAGNOSIS — Z79899 Other long term (current) drug therapy: Secondary | ICD-10-CM | POA: Insufficient documentation

## 2019-06-16 DIAGNOSIS — Z9221 Personal history of antineoplastic chemotherapy: Secondary | ICD-10-CM | POA: Diagnosis not present

## 2019-06-16 DIAGNOSIS — E119 Type 2 diabetes mellitus without complications: Secondary | ICD-10-CM | POA: Diagnosis not present

## 2019-06-16 DIAGNOSIS — Z95828 Presence of other vascular implants and grafts: Secondary | ICD-10-CM

## 2019-06-16 LAB — CBC WITH DIFFERENTIAL (CANCER CENTER ONLY)
Abs Immature Granulocytes: 0.06 10*3/uL (ref 0.00–0.07)
Basophils Absolute: 0 10*3/uL (ref 0.0–0.1)
Basophils Relative: 0 %
Eosinophils Absolute: 0.2 10*3/uL (ref 0.0–0.5)
Eosinophils Relative: 2 %
HCT: 36.3 % (ref 36.0–46.0)
Hemoglobin: 11.4 g/dL — ABNORMAL LOW (ref 12.0–15.0)
Immature Granulocytes: 1 %
Lymphocytes Relative: 13 %
Lymphs Abs: 1.3 10*3/uL (ref 0.7–4.0)
MCH: 28.9 pg (ref 26.0–34.0)
MCHC: 31.4 g/dL (ref 30.0–36.0)
MCV: 91.9 fL (ref 80.0–100.0)
Monocytes Absolute: 0.7 10*3/uL (ref 0.1–1.0)
Monocytes Relative: 7 %
Neutro Abs: 8.1 10*3/uL — ABNORMAL HIGH (ref 1.7–7.7)
Neutrophils Relative %: 77 %
Platelet Count: 517 10*3/uL — ABNORMAL HIGH (ref 150–400)
RBC: 3.95 MIL/uL (ref 3.87–5.11)
RDW: 14.2 % (ref 11.5–15.5)
WBC Count: 10.5 10*3/uL (ref 4.0–10.5)
nRBC: 0 % (ref 0.0–0.2)

## 2019-06-16 LAB — CMP (CANCER CENTER ONLY)
ALT: 8 U/L (ref 0–44)
AST: 10 U/L — ABNORMAL LOW (ref 15–41)
Albumin: 3.2 g/dL — ABNORMAL LOW (ref 3.5–5.0)
Alkaline Phosphatase: 63 U/L (ref 38–126)
Anion gap: 11 (ref 5–15)
BUN: 12 mg/dL (ref 8–23)
CO2: 27 mmol/L (ref 22–32)
Calcium: 9.7 mg/dL (ref 8.9–10.3)
Chloride: 101 mmol/L (ref 98–111)
Creatinine: 0.95 mg/dL (ref 0.44–1.00)
GFR, Est AFR Am: >60 mL/min (ref >60)
GFR, Estimated: 57 mL/min — ABNORMAL LOW (ref >60)
Glucose, Bld: 264 mg/dL — ABNORMAL HIGH (ref 70–99)
Potassium: 3.8 mmol/L (ref 3.5–5.1)
Sodium: 139 mmol/L (ref 135–145)
Total Bilirubin: 0.4 mg/dL (ref 0.3–1.2)
Total Protein: 6.6 g/dL (ref 6.5–8.1)

## 2019-06-16 LAB — TSH: TSH: 1.146 u[IU]/mL (ref 0.308–3.960)

## 2019-06-16 MED ORDER — SODIUM CHLORIDE 0.9 % IV SOLN
Freq: Once | INTRAVENOUS | Status: AC
  Administered 2019-06-16: 13:00:00 via INTRAVENOUS
  Filled 2019-06-16: qty 250

## 2019-06-16 MED ORDER — SODIUM CHLORIDE 0.9% FLUSH
10.0000 mL | INTRAVENOUS | Status: DC | PRN
  Administered 2019-06-16: 12:00:00 10 mL
  Filled 2019-06-16: qty 10

## 2019-06-16 MED ORDER — HEPARIN SOD (PORK) LOCK FLUSH 100 UNIT/ML IV SOLN
500.0000 [IU] | Freq: Once | INTRAVENOUS | Status: AC | PRN
  Administered 2019-06-16: 500 [IU]
  Filled 2019-06-16: qty 5

## 2019-06-16 MED ORDER — SODIUM CHLORIDE 0.9 % IV SOLN
10.1000 mg/kg | Freq: Once | INTRAVENOUS | Status: AC
  Administered 2019-06-16: 14:00:00 620 mg via INTRAVENOUS
  Filled 2019-06-16: qty 10

## 2019-06-16 MED ORDER — SODIUM CHLORIDE 0.9% FLUSH
10.0000 mL | INTRAVENOUS | Status: DC | PRN
  Administered 2019-06-16: 15:00:00 10 mL
  Filled 2019-06-16: qty 10

## 2019-06-16 NOTE — Progress Notes (Signed)
[facility] OFFICE PROGRESS NOTE  [name], [place] [address]  DIAGNOSIS: Recurrent non-small cell lung cancer likely squamous cell carcinoma that was initially diagnosed as a stage IIA(T2b, N0, M0) inAugust 2018status post curative stereotactic radiotherapy completed June 21, 2017. The patient presented today with concerning findings for disease recurrence and progression with enlarging and hypermetabolic right upper lobe lung mass presenting as stage IIIa (T3, N0, M0).  PDL 1 expression 0%.  PRIOR THERAPY: 1) Curative stereotactic radiotherapy completed on June 21, 2017. 2) A course of concurrent chemoradiation with chemotherapy consisting of carboplatin for an AUC of 2 and paclitaxel 45 mg/m.First dose started on 06/23/2018.Status post 7 cycles. Last dose of chemotherapy was given August 04, 2018. 3) 3)SBRT to the enlarging left lower lobe pulmonary nodule under the care of Dr. Sondra Come. Last radiation treatment scheduled for 03/26/2019  CURRENT THERAPY: Consolidation immunotherapy with Imfinzi 10 mg/KG every 2 weeks. First dose September 09, 2018. Status post 20cycles.  INTERVAL HISTORY: [name] 78 y.o. female returns to the clinic for a follow up visit. The patient is feeling well today without any concerning complaints. The patient continues to tolerate treatment with immunotherapy with Imfinzi well without any adverse side effects. Denies any fever, chills, night sweats, or weight loss. Denies any chest pain, cough, or hemoptysis. She reports her baseline shortness of breath. Denies any nausea, vomiting, diarrhea, or constipation. Denies any headache or visual changes. Denies any rashes or skin changes. She is meeting with her PCP on 06/25/2019 for management of her diabetes. The patient is here today for evaluation prior to starting cycle # 21  MEDICAL HISTORY: Past Medical History:  Diagnosis Date  . Cancer  ([name])    Lung   . Carotid artery occlusion   . Clostridium difficile infection 06/2013  . Diabetes mellitus    takes Janumet daily  . Dyslipidemia    takes Crestor daily  . Gallstones   . GERD (gastroesophageal reflux disease)   . Heart murmur    hx of  . History of radiation therapy 06/11/17-06/21/17   right lung 50 Gy in 5 fractions  . Hypertension    takes Prinzide and Verapamil daily  . Impaired speech    from stroke  . Neuropathy, diabetic ([place])   . Pneumonia   . PONV (postoperative nausea and vomiting)   . Seizures ([place]) 04/19/2018   recent hospitalization  . Smoker   . Stroke ([place]) 06/25/13  . Vertigo    but doesn't take any meds    ALLERGIES:  is allergic to codeine; benadryl [diphenhydramine]; lipitor [atorvastatin]; and phenergan [promethazine hcl].  MEDICATIONS:  Current Outpatient Medications  Medication Sig Dispense Refill  . aspirin EC 81 MG tablet Take 81 mg by mouth daily.    . Blood Glucose Monitoring Suppl (ONE TOUCH ULTRA 2) w/Device KIT 1 Device by Does not apply route 2 (two) times daily. 1 each 0  . clopidogrel (PLAVIX) 75 MG tablet Take 1 tablet (75 mg total) by mouth daily. 90 tablet 3  . divalproex (DEPAKOTE ER) 500 MG 24 hr tablet Take one tablet daily 30 tablet 6  . glucose blood (ONE TOUCH ULTRA TEST) test strip Use as instructed 100 each 12  . Lancets (ONETOUCH ULTRASOFT) lancets Use as instructed 100 each 12  . lidocaine-prilocaine (EMLA) cream APPLY TO THE AFFECTED AREA DAILY AS NEEDED (Patient taking differently: Apply 1 application topically daily as needed (Pain). ) 30 g 0  .  lisinopril (ZESTRIL) 10 MG tablet TAKE 1 TABLET(10 MG) BY MOUTH DAILY 90 tablet 3  . Multiple Vitamins-Minerals (MULTIVITAMIN PO) Take 1 tablet by mouth daily.    . pioglitazone (ACTOS) 30 MG tablet Take 1 tablet (30 mg total) by mouth daily. 90 tablet 1  . rosuvastatin (CRESTOR) 20 MG tablet TAKE 1 TABLET BY MOUTH ONCE DAILY 180 tablet 0  . SitaGLIPtin-MetFORMIN HCl  (JANUMET XR) 50-1000 MG TB24 Take 1 tablet by mouth daily. 60 tablet 5  . vitamin B-12 (CYANOCOBALAMIN) 1000 MCG tablet Take 1,000 mcg by mouth daily.    . prochlorperazine (COMPAZINE) 10 MG tablet Take 1 tablet (10 mg total) by mouth every 6 (six) hours as needed for nausea or vomiting. (Patient not taking: Reported on 04/13/2019) 30 tablet 0   No current facility-administered medications for this visit.    Facility-Administered Medications Ordered in Other Visits  Medication Dose Route Frequency Provider Last Rate Last Dose  . durvalumab (IMFINZI) 620 mg in sodium chloride 0.9 % 100 mL chemo infusion  10.1 mg/kg (Treatment Plan Recorded) Intravenous Once Curt Bears, MD      . heparin lock flush 100 unit/mL  500 Units Intracatheter Once PRN Curt Bears, MD      . heparin lock flush 100 unit/mL  500 Units Intracatheter Once PRN Curt Bears, MD      . sodium chloride flush (NS) 0.9 % injection 10 mL  10 mL Intracatheter PRN Curt Bears, MD      . sodium chloride flush (NS) 0.9 % injection 10 mL  10 mL Intracatheter PRN Curt Bears, MD        SURGICAL HISTORY:  Past Surgical History:  Procedure Laterality Date  . cataract removed Right   . CHOLECYSTECTOMY    . COLONOSCOPY    . ENDARTERECTOMY Left 07/14/2013   Procedure: ENDARTERECTOMY CAROTID-LEFT;  Surgeon: Elam Dutch, MD;  Location: [facility];  Service: Vascular;  Laterality: Left;  . EYE SURGERY Left    cataract  . IR CV LINE INJECTION  05/13/2019  . IR IMAGING GUIDED PORT INSERTION  09/15/2018  . IR PATIENT EVAL TECH 0-60 MINS  05/08/2019  . IR PERC PLEURAL DRAIN W/INDWELL CATH W/IMG GUIDE  05/23/2017  . KNEE SURGERY Left 47yr ago  . LAPAROSCOPIC PARTIAL COLECTOMY N/A 09/02/2015   Procedure: LAPAROSCOPIC PARTIAL RIGHT COLECTOMY;  Surgeon: ALeighton Ruff MD;  [facility];  Service: General;  Laterality: N/A;  . PATCH ANGIOPLASTY Left 07/14/2013   Procedure: LEFT CAROTID ARTERY PATCH ANGIOPLASTY;   Surgeon: CElam Dutch MD;  [facility]  Service: V[facility];  Laterality: Left;  .Marland KitchenVIDEO BRONCHOSCOPY N/A 06/04/2018   Procedure: VIDEO BRONCHOSCOPY;  Surgeon: HMelrose Nakayama MD;  [facility];  Service: Thoracic;  Laterality: N/A;  . wisdom      REVIEW OF SYSTEMS:   Review of Systems  Constitutional: Negative for appetite change, chills, fatigue, fever and unexpected weight change.  HENT: Negative for mouth sores, nosebleeds, sore throat and trouble swallowing.   Eyes: Negative for eye problems and icterus.  Respiratory: Positive for cough and dyspnea on exertion.Negative for hemoptysis and wheezing.   Cardiovascular: Negative for chest pain and leg swelling.  Gastrointestinal: Negative for abdominal pain, constipation, diarrhea, nausea and vomiting.  Genitourinary: Negative for bladder incontinence, difficulty urinating, dysuria, frequency and hematuria.   Musculoskeletal: Negative for back pain, gait problem, neck pain and neck stiffness.  Skin: Negative for itching and rash.  Neurological: Negative for dizziness, extremity weakness, gait  problem, headaches, light-headedness and seizures.  Hematological: Negative for adenopathy. Does not bruise/bleed easily.  Psychiatric/Behavioral: Negative for confusion, depression and sleep disturbance. The patient is not nervous/anxious.      PHYSICAL EXAMINATION:  Blood pressure 138/73, pulse 95, temperature 98.7 F (37.1 C), temperature source Temporal, resp. rate 18, height '5\' 6"'  (1.676 m), weight 142 lb 6.4 oz (64.6 kg), SpO2 97 %.  ECOG PERFORMANCE STATUS: 1 - Symptomatic but completely ambulatory  Physical Exam  Constitutional: Oriented to person, place, and time and well-developed, well-nourished, and in no distress.  HENT:  Head: Normocephalic and atraumatic.  Mouth/Throat: Oropharynx is clear and moist. No oropharyngeal exudate.  Eyes: Conjunctivae are normal. Right eye exhibits no discharge. Left eye exhibits no  discharge. No scleral icterus.  Neck: Normal range of motion. Neck supple.  Cardiovascular: Normal rate, regular rhythm, normal heart sounds and intact distal pulses.   Pulmonary/Chest: Effort normal and breath sounds normal. No respiratory distress. No wheezes. No rales.  Abdominal: Soft. Bowel sounds are normal. Exhibits no distension and no mass. There is no tenderness.  Musculoskeletal: Normal range of motion. Exhibits no edema.  Lymphadenopathy:    No cervical adenopathy.  Neurological: Alert and oriented to person, place, and time. Exhibits normal muscle tone. Gait normal. Coordination normal.  Skin: Skin is warm and dry. No rash noted. Not diaphoretic. No erythema. No pallor.  Psychiatric: Mood, memory and judgment normal.  Vitals reviewed.  LABORATORY DATA: Lab Results  Component Value Date   WBC 10.5 06/16/2019   HGB 11.4 (L) 06/16/2019   HCT 36.3 06/16/2019   MCV 91.9 06/16/2019   PLT 517 (H) 06/16/2019      Chemistry      Component Value Date/Time   NA 139 06/16/2019 1204   NA 139 05/07/2018 1147   K 3.8 06/16/2019 1204   CL 101 06/16/2019 1204   CO2 27 06/16/2019 1204   BUN 12 06/16/2019 1204   BUN 15 05/07/2018 1147   CREATININE 0.95 06/16/2019 1204   CREATININE 0.87 03/08/2015 0001      Component Value Date/Time   CALCIUM 9.7 06/16/2019 1204   ALKPHOS 63 06/16/2019 1204   AST 10 (L) 06/16/2019 1204   ALT 8 06/16/2019 1204   BILITOT 0.4 06/16/2019 1204       RADIOGRAPHIC STUDIES:  No results found.   ASSESSMENT/PLAN:  This is a very pleasant 78 year old Caucasian female with recurrentstage IIIAnon-small cell lung cancer, squamous cell carcinomaof the right upper lobe. She presented initially as a stage IIa in August of 2018.  The patient underwent a course of concurrent chemoradiation with weekly carboplatin and paclitaxel. She status post 7 cycles. She tolerated treatment well. She is currently undergoing consolidation immunotherapy with  Imfinzi 10 mg/kg IV every 2 weeks. She is status post20cycles. She continues to tolerate her treatment well without any adverse side effects.  She recently completed SBRT under the care of Dr. Sondra Come to the enlarging left pulmonary nodule on 03/26/2019.   Labs were reviewed recommend that she proceed with cycle #21 today as scheduled.  We will see her back for a follow up visit in 2 weeks before starting cycle #22.   Her blood sugar is elevated today. She will take her home medications upon returning home. She has an appointment with her PCP for diabetes management next week.   The patient was advised to call immediately if she has any concerning symptoms in the interval. The patient voices understanding of current disease status and treatment  options and is in agreement with the current care plan. All questions were answered. The patient knows to call the clinic with any problems, questions or concerns. We can certainly see the patient much sooner if necessary  No orders of the defined types were placed in this encounter.    Cassandra L Heilingoetter, PA-C 06/16/19

## 2019-06-16 NOTE — Patient Instructions (Signed)
[facility] Discharge Instructions for Patients Receiving Chemotherapy  Today you received the following chemotherapy agents: durvalumab.  To help prevent nausea and vomiting after your treatment, we encourage you to take your nausea medication as directed.   If you develop nausea and vomiting that is not controlled by your nausea medication, call the clinic.   BELOW ARE SYMPTOMS THAT SHOULD BE REPORTED IMMEDIATELY:  *FEVER GREATER THAN 100.5 F  *CHILLS WITH OR WITHOUT FEVER  NAUSEA AND VOMITING THAT IS NOT CONTROLLED WITH YOUR NAUSEA MEDICATION  *UNUSUAL SHORTNESS OF BREATH  *UNUSUAL BRUISING OR BLEEDING  TENDERNESS IN MOUTH AND THROAT WITH OR WITHOUT PRESENCE OF ULCERS  *URINARY PROBLEMS  *BOWEL PROBLEMS  UNUSUAL RASH Items with * indicate a potential emergency and should be followed up as soon as possible.  Feel free to call the clinic should you have any questions or concerns. The clinic phone number is [phone].  Please show the CHEMO ALERT CARD at check-in to the Emergency Department and triage nurse.   

## 2019-06-17 ENCOUNTER — Telehealth: Payer: Self-pay | Admitting: Internal Medicine

## 2019-06-17 NOTE — Telephone Encounter (Signed)
Scheduled per los. Called and left msg. Mailed printout  °

## 2019-06-25 ENCOUNTER — Ambulatory Visit (INDEPENDENT_AMBULATORY_CARE_PROVIDER_SITE_OTHER): Payer: Medicare Other | Admitting: Family Medicine

## 2019-06-25 ENCOUNTER — Other Ambulatory Visit: Payer: Self-pay

## 2019-06-25 ENCOUNTER — Encounter: Payer: Self-pay | Admitting: Family Medicine

## 2019-06-25 VITALS — BP 136/80 | HR 97 | Temp 97.9°F | Wt 139.6 lb

## 2019-06-25 DIAGNOSIS — Z8673 Personal history of transient ischemic attack (TIA), and cerebral infarction without residual deficits: Secondary | ICD-10-CM

## 2019-06-25 DIAGNOSIS — F17210 Nicotine dependence, cigarettes, uncomplicated: Secondary | ICD-10-CM | POA: Diagnosis not present

## 2019-06-25 DIAGNOSIS — E785 Hyperlipidemia, unspecified: Secondary | ICD-10-CM

## 2019-06-25 DIAGNOSIS — E1169 Type 2 diabetes mellitus with other specified complication: Secondary | ICD-10-CM | POA: Diagnosis not present

## 2019-06-25 DIAGNOSIS — G43109 Migraine with aura, not intractable, without status migrainosus: Secondary | ICD-10-CM

## 2019-06-25 DIAGNOSIS — G459 Transient cerebral ischemic attack, unspecified: Secondary | ICD-10-CM

## 2019-06-25 DIAGNOSIS — C3492 Malignant neoplasm of unspecified part of left bronchus or lung: Secondary | ICD-10-CM

## 2019-06-25 DIAGNOSIS — R569 Unspecified convulsions: Secondary | ICD-10-CM

## 2019-06-25 DIAGNOSIS — E1159 Type 2 diabetes mellitus with other circulatory complications: Secondary | ICD-10-CM

## 2019-06-25 DIAGNOSIS — E1140 Type 2 diabetes mellitus with diabetic neuropathy, unspecified: Secondary | ICD-10-CM

## 2019-06-25 DIAGNOSIS — I10 Essential (primary) hypertension: Secondary | ICD-10-CM

## 2019-06-25 DIAGNOSIS — I152 Hypertension secondary to endocrine disorders: Secondary | ICD-10-CM

## 2019-06-25 LAB — POCT GLYCOSYLATED HEMOGLOBIN (HGB A1C): Hemoglobin A1C: 8.2 % — AB (ref 4.0–5.6)

## 2019-06-25 NOTE — Patient Instructions (Signed)
I want you to become more physically active.  Walk to the mailbox and back several times a day

## 2019-06-25 NOTE — Progress Notes (Signed)
  Subjective:    Patient ID: [name], female    DOB: 12-02-1940, 78 y.o.   MRN: [id number]  [name] is a 78 y.o. female who presents for follow-up of Type 2 diabetes mellitus. Home blood sugar records: meter records fasting 170- 260 Current symptoms/problems include high blood sugar readings. Daily foot  Check: yes   Any foot concerns: none  Exercise: walking  Diet: regular Eye exam not done.  Encouraged to make an appointment  She continues to be seen by oncology and is involved in chemotherapy regularly for that.  She continues on Janumet as well as pioglitazone.  She is taking Crestor and having no aches or pains with that.  Continues on lisinopril.  She also is taking Depakote for her underlying seizure disorder and also continues on Plavix.  She continues to smoke.  She has not had any more trouble with migraines. The following portions of the patient's history were reviewed and updated as appropriate: allergies, current medications, past medical history, past social history and problem list.  ROS as in subjective above.     Objective:    Physical Exam Alert and in no distress otherwise not examined.  Hemoglobin A1c is 8.2 Lab Review Diabetic Labs Latest Ref Rng & Units 06/16/2019 06/02/2019 05/19/2019 05/05/2019 04/21/2019  HbA1c 4.0 - 5.6 % - - - - -  Microalbumin mg/L - - - - -  Micro/Creat Ratio - - - - - -  Chol 0 - 200 mg/dL - - - - -  HDL >40 mg/dL - - - - -  Calc LDL 0 - 99 mg/dL - - - - -  Triglycerides <150 mg/dL - - - - -  Creatinine 0.44 - 1.00 mg/dL 0.95 0.88 0.89 0.96 0.89   BP/Weight 06/16/2019 06/02/2019 05/19/2019 05/13/2019 70/96/2836  Systolic BP 629 476 546 503 546  Diastolic BP 73 82 60 69 57  Wt. (Lbs) 142.4 143.5 145 - -  BMI 22.98 23.16 23.4 - -   Foot/eye exam completion dates Latest Ref Rng & Units 03/08/2015 04/15/2014  Eye Exam No Retinopathy - No Retinopathy  Foot Form Completion - Done -    Launa  reports that she has been  smoking cigarettes. She has a 30.00 pack-year smoking history. She has never used smokeless tobacco. She reports that she does not drink alcohol or use drugs.     Assessment & Plan:    DM type 2 with diabetic dyslipidemia ([place])  History of CVA (cerebrovascular accident)  Hyperlipidemia associated with type 2 diabetes mellitus ([place])  Hypertension associated with diabetes ([place])  Light smoker  Migraine equivalent  Non-small cell cancer of left lung (HCC)  Partial seizure ([place])  TIA (transient ischemic attack)  Type 2 diabetes mellitus with diabetic neuropathy, without long-term current use of insulin (HCC) 1.  2. Rx changes: none 3. Education: Reviewed 'ABCs' of diabetes management (respective goals in parentheses):  A1C (<7), blood pressure (<130/80), and cholesterol (LDL <100). 4. Compliance at present is estimated to be good. Efforts to improve compliance (if necessary) will be directed at increased exercise. 5. Follow up: 4 months  In general she seems to be doing fairly well.  Discussed the need for Covid vaccination however at this point she is reluctant.  Also discussed smoking cessation and again she is really not interested.  Strongly encouraged her to make an appointment with ophthalmology.

## 2019-06-25 NOTE — Addendum Note (Signed)
Addended by: Elyse Jarvis on: 06/25/2019 12:22 PM   Modules accepted: Orders

## 2019-06-30 ENCOUNTER — Inpatient Hospital Stay: Payer: Medicare Other

## 2019-06-30 ENCOUNTER — Inpatient Hospital Stay (HOSPITAL_BASED_OUTPATIENT_CLINIC_OR_DEPARTMENT_OTHER): Payer: Medicare Other | Admitting: Internal Medicine

## 2019-06-30 ENCOUNTER — Encounter: Payer: Self-pay | Admitting: Internal Medicine

## 2019-06-30 ENCOUNTER — Other Ambulatory Visit: Payer: Self-pay

## 2019-06-30 VITALS — BP 138/72 | HR 88 | Temp 99.1°F | Resp 18 | Ht 66.0 in | Wt 139.9 lb

## 2019-06-30 DIAGNOSIS — E785 Hyperlipidemia, unspecified: Secondary | ICD-10-CM | POA: Diagnosis not present

## 2019-06-30 DIAGNOSIS — C3492 Malignant neoplasm of unspecified part of left bronchus or lung: Secondary | ICD-10-CM | POA: Diagnosis not present

## 2019-06-30 DIAGNOSIS — Z7984 Long term (current) use of oral hypoglycemic drugs: Secondary | ICD-10-CM | POA: Diagnosis not present

## 2019-06-30 DIAGNOSIS — C3411 Malignant neoplasm of upper lobe, right bronchus or lung: Secondary | ICD-10-CM | POA: Diagnosis not present

## 2019-06-30 DIAGNOSIS — Z87891 Personal history of nicotine dependence: Secondary | ICD-10-CM | POA: Diagnosis not present

## 2019-06-30 DIAGNOSIS — Z9221 Personal history of antineoplastic chemotherapy: Secondary | ICD-10-CM | POA: Diagnosis not present

## 2019-06-30 DIAGNOSIS — Z8673 Personal history of transient ischemic attack (TIA), and cerebral infarction without residual deficits: Secondary | ICD-10-CM | POA: Diagnosis not present

## 2019-06-30 DIAGNOSIS — Z79899 Other long term (current) drug therapy: Secondary | ICD-10-CM | POA: Diagnosis not present

## 2019-06-30 DIAGNOSIS — I10 Essential (primary) hypertension: Secondary | ICD-10-CM | POA: Diagnosis not present

## 2019-06-30 DIAGNOSIS — C3491 Malignant neoplasm of unspecified part of right bronchus or lung: Secondary | ICD-10-CM

## 2019-06-30 DIAGNOSIS — E119 Type 2 diabetes mellitus without complications: Secondary | ICD-10-CM | POA: Diagnosis not present

## 2019-06-30 DIAGNOSIS — Z923 Personal history of irradiation: Secondary | ICD-10-CM | POA: Diagnosis not present

## 2019-06-30 DIAGNOSIS — Z95828 Presence of other vascular implants and grafts: Secondary | ICD-10-CM

## 2019-06-30 DIAGNOSIS — Z5112 Encounter for antineoplastic immunotherapy: Secondary | ICD-10-CM

## 2019-06-30 DIAGNOSIS — Z7982 Long term (current) use of aspirin: Secondary | ICD-10-CM | POA: Diagnosis not present

## 2019-06-30 LAB — CBC WITH DIFFERENTIAL (CANCER CENTER ONLY)
Abs Immature Granulocytes: 0.03 10*3/uL (ref 0.00–0.07)
Basophils Absolute: 0 10*3/uL (ref 0.0–0.1)
Basophils Relative: 0 %
Eosinophils Absolute: 0.3 10*3/uL (ref 0.0–0.5)
Eosinophils Relative: 3 %
HCT: 37.5 % (ref 36.0–46.0)
Hemoglobin: 12 g/dL (ref 12.0–15.0)
Immature Granulocytes: 0 %
Lymphocytes Relative: 13 %
Lymphs Abs: 1.5 10*3/uL (ref 0.7–4.0)
MCH: 28.8 pg (ref 26.0–34.0)
MCHC: 32 g/dL (ref 30.0–36.0)
MCV: 89.9 fL (ref 80.0–100.0)
Monocytes Absolute: 0.9 10*3/uL (ref 0.1–1.0)
Monocytes Relative: 8 %
Neutro Abs: 8.6 10*3/uL — ABNORMAL HIGH (ref 1.7–7.7)
Neutrophils Relative %: 76 %
Platelet Count: 504 10*3/uL — ABNORMAL HIGH (ref 150–400)
RBC: 4.17 MIL/uL (ref 3.87–5.11)
RDW: 13.8 % (ref 11.5–15.5)
WBC Count: 11.3 10*3/uL — ABNORMAL HIGH (ref 4.0–10.5)
nRBC: 0 % (ref 0.0–0.2)

## 2019-06-30 LAB — CMP (CANCER CENTER ONLY)
ALT: 8 U/L (ref 0–44)
AST: 11 U/L — ABNORMAL LOW (ref 15–41)
Albumin: 3.3 g/dL — ABNORMAL LOW (ref 3.5–5.0)
Alkaline Phosphatase: 73 U/L (ref 38–126)
Anion gap: 12 (ref 5–15)
BUN: 15 mg/dL (ref 8–23)
CO2: 26 mmol/L (ref 22–32)
Calcium: 9.4 mg/dL (ref 8.9–10.3)
Chloride: 100 mmol/L (ref 98–111)
Creatinine: 0.83 mg/dL (ref 0.44–1.00)
GFR, Est AFR Am: >60 mL/min (ref >60)
GFR, Estimated: >60 mL/min (ref >60)
Glucose, Bld: 168 mg/dL — ABNORMAL HIGH (ref 70–99)
Potassium: 3.7 mmol/L (ref 3.5–5.1)
Sodium: 138 mmol/L (ref 135–145)
Total Bilirubin: 0.5 mg/dL (ref 0.3–1.2)
Total Protein: 6.9 g/dL (ref 6.5–8.1)

## 2019-06-30 MED ORDER — SODIUM CHLORIDE 0.9% FLUSH
10.0000 mL | INTRAVENOUS | Status: DC | PRN
  Administered 2019-06-30: 10 mL
  Filled 2019-06-30: qty 10

## 2019-06-30 MED ORDER — SODIUM CHLORIDE 0.9 % IV SOLN
10.1000 mg/kg | Freq: Once | INTRAVENOUS | Status: AC
  Administered 2019-06-30: 620 mg via INTRAVENOUS
  Filled 2019-06-30: qty 10

## 2019-06-30 MED ORDER — SODIUM CHLORIDE 0.9 % IV SOLN
Freq: Once | INTRAVENOUS | Status: AC
  Filled 2019-06-30: qty 250

## 2019-06-30 MED ORDER — HEPARIN SOD (PORK) LOCK FLUSH 100 UNIT/ML IV SOLN
500.0000 [IU] | Freq: Once | INTRAVENOUS | Status: AC | PRN
  Administered 2019-06-30: 500 [IU]
  Filled 2019-06-30: qty 5

## 2019-06-30 NOTE — Patient Instructions (Signed)

## 2019-06-30 NOTE — Progress Notes (Signed)
[place] [phone]   [phone]  OFFICE PROGRESS NOTE  [name], MD [place] [address]  DIAGNOSIS:Recurrent non-small cell lung cancer likely squamous cell carcinoma that was initially diagnosed as a stage IIA(T2b, N0, M0) inAugust 2018status post curative stereotactic radiotherapy completed June 21, 2017. The patient presented today with concerning findings for disease recurrence and progression with enlarging and hypermetabolic right upper lobe lung mass presenting as stage IIIa (T3, N0, M0).  PDL 1 expression 0%.  PRIOR THERAPY: 1) Curative stereotactic radiotherapy completed on June 21, 2017. 2) A course of concurrent chemoradiation with chemotherapy consisting of carboplatin for an AUC of 2 and paclitaxel 45 mg/m.  First dose started on 06/23/2018.  Status post 7 cycles.  Last dose of chemotherapy was given August 04, 2018.  CURRENT THERAPY:Consolidation immunotherapy with Imfinzi 10 mg/KG every 2 weeks.  First dose September 09, 2018.  Status post 21 cycles.  INTERVAL HISTORY: [name] 78 y.o. female returns to the clinic today for follow-up visit.  The patient is feeling fine today with no concerning complaints except for the baseline shortness of breath.  She continues to smoke few cigarettes every day.  She denied having any chest pain, cough or hemoptysis.  She denied having any fever or chills.  She has no nausea, vomiting, diarrhea or constipation.  She denied having any headache or visual changes.  She continues to tolerate her treatment with immunotherapy fairly well.  She is here today for evaluation before starting cycle #22.  MEDICAL HISTORY: Past Medical History:  Diagnosis Date  . Cancer ([place])    Lung   . Carotid artery occlusion   . Clostridium difficile infection 06/2013  . Diabetes mellitus    takes Janumet daily  . Dyslipidemia    takes Crestor daily  .  Gallstones   . GERD (gastroesophageal reflux disease)   . Heart murmur    hx of  . History of radiation therapy 06/11/17-06/21/17   right lung 50 Gy in 5 fractions  . Hypertension    takes Prinzide and Verapamil daily  . Impaired speech    from stroke  . Neuropathy, diabetic ([place])   . Pneumonia   . PONV (postoperative nausea and vomiting)   . Seizures ([place]) 04/19/2018   recent hospitalization  . Smoker   . Stroke ([place]) 06/25/13  . Vertigo    but doesn't take any meds    ALLERGIES:  is allergic to codeine; benadryl [diphenhydramine]; lipitor [atorvastatin]; and phenergan [promethazine hcl].  MEDICATIONS:  Current Outpatient Medications  Medication Sig Dispense Refill  . aspirin EC 81 MG tablet Take 81 mg by mouth daily.    . Blood Glucose Monitoring Suppl (ONE TOUCH ULTRA 2) w/Device KIT 1 Device by Does not apply route 2 (two) times daily. 1 each 0  . clopidogrel (PLAVIX) 75 MG tablet Take 1 tablet (75 mg total) by mouth daily. 90 tablet 3  . divalproex (DEPAKOTE ER) 500 MG 24 hr tablet Take one tablet daily 30 tablet 6  . glucose blood (ONE TOUCH ULTRA TEST) test strip Use as instructed 100 each 12  . Lancets (ONETOUCH ULTRASOFT) lancets Use as instructed 100 each 12  . lidocaine-prilocaine (EMLA) cream APPLY TO THE AFFECTED AREA DAILY AS NEEDED (Patient taking differently: Apply 1 application topically daily as needed (Pain). ) 30 g 0  . lisinopril (ZESTRIL) 10 MG tablet TAKE 1 TABLET(10 MG) BY MOUTH DAILY 90 tablet 3  .  Multiple Vitamins-Minerals (MULTIVITAMIN PO) Take 1 tablet by mouth daily.    . pioglitazone (ACTOS) 30 MG tablet Take 1 tablet (30 mg total) by mouth daily. 90 tablet 1  . prochlorperazine (COMPAZINE) 10 MG tablet Take 1 tablet (10 mg total) by mouth every 6 (six) hours as needed for nausea or vomiting. 30 tablet 0  . rosuvastatin (CRESTOR) 20 MG tablet TAKE 1 TABLET BY MOUTH ONCE DAILY 180 tablet 0  . SitaGLIPtin-MetFORMIN HCl (JANUMET XR) 50-1000 MG TB24  Take 1 tablet by mouth daily. 60 tablet 5  . vitamin B-12 (CYANOCOBALAMIN) 1000 MCG tablet Take 1,000 mcg by mouth daily.     No current facility-administered medications for this visit.   Facility-Administered Medications Ordered in Other Visits  Medication Dose Route Frequency Provider Last Rate Last Admin  . heparin lock flush 100 unit/mL  500 Units Intracatheter Once PRN Curt Bears, MD      . sodium chloride flush (NS) 0.9 % injection 10 mL  10 mL Intracatheter PRN Curt Bears, MD        SURGICAL HISTORY:  Past Surgical History:  Procedure Laterality Date  . cataract removed Right   . CHOLECYSTECTOMY    . COLONOSCOPY    . ENDARTERECTOMY Left 07/14/2013   Procedure: ENDARTERECTOMY CAROTID-LEFT;  Surgeon: Elam Dutch, MD;  Location: [facility];  Service: Vascular;  Laterality: Left;  . EYE SURGERY Left    cataract  . IR CV LINE INJECTION  05/13/2019  . IR IMAGING GUIDED PORT INSERTION  09/15/2018  . IR PATIENT EVAL TECH 0-60 MINS  05/08/2019  . IR PERC PLEURAL DRAIN W/INDWELL CATH W/IMG GUIDE  05/23/2017  . KNEE SURGERY Left 56yr ago  . LAPAROSCOPIC PARTIAL COLECTOMY N/A 09/02/2015   Procedure: LAPAROSCOPIC PARTIAL RIGHT COLECTOMY;  Surgeon: ALeighton Ruff MD;  Location: [facility];  Service: General;  Laterality: N/A;  . PATCH ANGIOPLASTY Left 07/14/2013   Procedure: LEFT CAROTID ARTERY PATCH ANGIOPLASTY;  Surgeon: CElam Dutch MD;  [place] [facility]  Service: Vascular;  Laterality: Left;  .Marland KitchenVIDEO BRONCHOSCOPY N/A 06/04/2018   Procedure: VIDEO BRONCHOSCOPY;  Surgeon: HMelrose Nakayama MD;  [facility];  Service: Thoracic;  Laterality: N/A;  . wisdom      REVIEW OF SYSTEMS:  A comprehensive review of systems was negative except for: Respiratory: positive for dyspnea on exertion   PHYSICAL EXAMINATION: General appearance: alert, cooperative and no distress Head: Normocephalic, without obvious abnormality, atraumatic Neck: no adenopathy, no JVD, supple,  symmetrical, trachea midline and thyroid not enlarged, symmetric, no tenderness/mass/nodules Lymph nodes: Cervical, supraclavicular, and axillary nodes normal. Resp: clear to auscultation bilaterally Back: symmetric, no curvature. ROM normal. No CVA tenderness. Cardio: regular rate and rhythm, S1, S2 normal, no murmur, click, rub or gallop GI: soft, non-tender; bowel sounds normal; no masses,  no organomegaly Extremities: extremities normal, atraumatic, no cyanosis or edema  ECOG PERFORMANCE STATUS: 1 - Symptomatic but completely ambulatory  Blood pressure 138/72, pulse 88, temperature 99.1 F (37.3 C), temperature source Temporal, resp. rate 18, height _0  (1.676 m), weight 139 lb 14.4 oz (63.5 kg), SpO2 100 %.  LABORATORY DATA: Lab Results  Component Value Date   WBC 11.3 (H) 06/30/2019   HGB 12.0 06/30/2019   HCT 37.5 06/30/2019   MCV 89.9 06/30/2019   PLT 504 (H) 06/30/2019      Chemistry      Component Value Date/Time   NA 139 06/16/2019 1204   NA 139 05/07/2018 1147   K  3.8 06/16/2019 1204   CL 101 06/16/2019 1204   CO2 27 06/16/2019 1204   BUN 12 06/16/2019 1204   BUN 15 05/07/2018 1147   CREATININE 0.95 06/16/2019 1204   CREATININE 0.87 03/08/2015 0001      Component Value Date/Time   CALCIUM 9.7 06/16/2019 1204   ALKPHOS 63 06/16/2019 1204   AST 10 (L) 06/16/2019 1204   ALT 8 06/16/2019 1204   BILITOT 0.4 06/16/2019 1204       RADIOGRAPHIC STUDIES: No results found.  ASSESSMENT AND PLAN: This is a very pleasant 78 years old white female with recurrent non-small cell lung cancer presented as a stage IIIa involving the right lower lobe. The patient underwent a course of concurrent chemoradiation with weekly carboplatin and paclitaxel status post 7 cycles.  She tolerated this treatment well. She had repeat CT scan of the chest performed recently.  I personally and independently reviewed the scan images and discussed the results with the patient and her  daughter. Her scan showed improvement of the right lung mass with no other concerning findings for progression. The patient was started on treatment with consolidation immunotherapy with Imfinzi status post 21 cycles.   The patient continues to tolerate her treatment well with no concerning adverse effects. I recommended for her to proceed with cycle #22 today as planned. She will come back for follow-up visit in 2 weeks for evaluation before the next cycle of her treatment. She was advised to call immediately if she has any concerning symptoms in the interval. The patient voices understanding of current disease status and treatment options and is in agreement with the current care plan. All questions were answered. The patient knows to call the clinic with any problems, questions or concerns. We can certainly see the patient much sooner if necessary.  Disclaimer: This note was dictated with voice recognition software. Similar sounding words can inadvertently be transcribed and may not be corrected upon review.

## 2019-06-30 NOTE — Patient Instructions (Signed)
[facility] Discharge Instructions for Patients Receiving Chemotherapy  Today you received the following chemotherapy agents: durvalumab.  To help prevent nausea and vomiting after your treatment, we encourage you to take your nausea medication as directed.   If you develop nausea and vomiting that is not controlled by your nausea medication, call the clinic.   BELOW ARE SYMPTOMS THAT SHOULD BE REPORTED IMMEDIATELY:  *FEVER GREATER THAN 100.5 F  *CHILLS WITH OR WITHOUT FEVER  NAUSEA AND VOMITING THAT IS NOT CONTROLLED WITH YOUR NAUSEA MEDICATION  *UNUSUAL SHORTNESS OF BREATH  *UNUSUAL BRUISING OR BLEEDING  TENDERNESS IN MOUTH AND THROAT WITH OR WITHOUT PRESENCE OF ULCERS  *URINARY PROBLEMS  *BOWEL PROBLEMS  UNUSUAL RASH Items with * indicate a potential emergency and should be followed up as soon as possible.  Feel free to call the clinic should you have any questions or concerns. The clinic phone number is [phone].  Please show the CHEMO ALERT CARD at check-in to the Emergency Department and triage nurse.   

## 2019-07-14 ENCOUNTER — Encounter: Payer: Self-pay | Admitting: Internal Medicine

## 2019-07-14 ENCOUNTER — Inpatient Hospital Stay: Payer: Medicare Other

## 2019-07-14 ENCOUNTER — Inpatient Hospital Stay (HOSPITAL_BASED_OUTPATIENT_CLINIC_OR_DEPARTMENT_OTHER): Payer: Medicare Other | Admitting: Internal Medicine

## 2019-07-14 ENCOUNTER — Other Ambulatory Visit: Payer: Self-pay

## 2019-07-14 ENCOUNTER — Other Ambulatory Visit (INDEPENDENT_AMBULATORY_CARE_PROVIDER_SITE_OTHER): Payer: Medicare Other

## 2019-07-14 VITALS — BP 146/72 | HR 92 | Temp 98.7°F | Resp 20 | Ht 66.0 in | Wt 138.4 lb

## 2019-07-14 DIAGNOSIS — Z923 Personal history of irradiation: Secondary | ICD-10-CM | POA: Diagnosis not present

## 2019-07-14 DIAGNOSIS — C3491 Malignant neoplasm of unspecified part of right bronchus or lung: Secondary | ICD-10-CM

## 2019-07-14 DIAGNOSIS — C3411 Malignant neoplasm of upper lobe, right bronchus or lung: Secondary | ICD-10-CM | POA: Diagnosis not present

## 2019-07-14 DIAGNOSIS — Z5112 Encounter for antineoplastic immunotherapy: Secondary | ICD-10-CM | POA: Diagnosis not present

## 2019-07-14 DIAGNOSIS — Z79899 Other long term (current) drug therapy: Secondary | ICD-10-CM | POA: Diagnosis not present

## 2019-07-14 DIAGNOSIS — E785 Hyperlipidemia, unspecified: Secondary | ICD-10-CM | POA: Diagnosis not present

## 2019-07-14 DIAGNOSIS — Z8673 Personal history of transient ischemic attack (TIA), and cerebral infarction without residual deficits: Secondary | ICD-10-CM | POA: Diagnosis not present

## 2019-07-14 DIAGNOSIS — E538 Deficiency of other specified B group vitamins: Secondary | ICD-10-CM | POA: Diagnosis not present

## 2019-07-14 DIAGNOSIS — Z95828 Presence of other vascular implants and grafts: Secondary | ICD-10-CM

## 2019-07-14 DIAGNOSIS — Z7982 Long term (current) use of aspirin: Secondary | ICD-10-CM | POA: Diagnosis not present

## 2019-07-14 DIAGNOSIS — Z9221 Personal history of antineoplastic chemotherapy: Secondary | ICD-10-CM | POA: Diagnosis not present

## 2019-07-14 DIAGNOSIS — E119 Type 2 diabetes mellitus without complications: Secondary | ICD-10-CM | POA: Diagnosis not present

## 2019-07-14 DIAGNOSIS — Z7984 Long term (current) use of oral hypoglycemic drugs: Secondary | ICD-10-CM | POA: Diagnosis not present

## 2019-07-14 DIAGNOSIS — Z87891 Personal history of nicotine dependence: Secondary | ICD-10-CM | POA: Diagnosis not present

## 2019-07-14 DIAGNOSIS — I10 Essential (primary) hypertension: Secondary | ICD-10-CM | POA: Diagnosis not present

## 2019-07-14 LAB — CMP (CANCER CENTER ONLY)
ALT: 12 U/L (ref 0–44)
AST: 15 U/L (ref 15–41)
Albumin: 3.8 g/dL (ref 3.5–5.0)
Alkaline Phosphatase: 69 U/L (ref 38–126)
Anion gap: 12 (ref 5–15)
BUN: 14 mg/dL (ref 8–23)
CO2: 26 mmol/L (ref 22–32)
Calcium: 9.7 mg/dL (ref 8.9–10.3)
Chloride: 100 mmol/L (ref 98–111)
Creatinine: 0.84 mg/dL (ref 0.44–1.00)
GFR, Est AFR Am: >60 mL/min (ref >60)
GFR, Estimated: >60 mL/min (ref >60)
Glucose, Bld: 240 mg/dL — ABNORMAL HIGH (ref 70–99)
Potassium: 3.9 mmol/L (ref 3.5–5.1)
Sodium: 138 mmol/L (ref 135–145)
Total Bilirubin: 0.5 mg/dL (ref 0.3–1.2)
Total Protein: 7.3 g/dL (ref 6.5–8.1)

## 2019-07-14 LAB — CBC WITH DIFFERENTIAL (CANCER CENTER ONLY)
Abs Immature Granulocytes: 0.03 10*3/uL (ref 0.00–0.07)
Basophils Absolute: 0.1 10*3/uL (ref 0.0–0.1)
Basophils Relative: 0 %
Eosinophils Absolute: 0.3 10*3/uL (ref 0.0–0.5)
Eosinophils Relative: 3 %
HCT: 38.3 % (ref 36.0–46.0)
Hemoglobin: 12.2 g/dL (ref 12.0–15.0)
Immature Granulocytes: 0 %
Lymphocytes Relative: 15 %
Lymphs Abs: 1.8 10*3/uL (ref 0.7–4.0)
MCH: 28.2 pg (ref 26.0–34.0)
MCHC: 31.9 g/dL (ref 30.0–36.0)
MCV: 88.5 fL (ref 80.0–100.0)
Monocytes Absolute: 0.8 10*3/uL (ref 0.1–1.0)
Monocytes Relative: 7 %
Neutro Abs: 9 10*3/uL — ABNORMAL HIGH (ref 1.7–7.7)
Neutrophils Relative %: 75 %
Platelet Count: 515 10*3/uL — ABNORMAL HIGH (ref 150–400)
RBC: 4.33 MIL/uL (ref 3.87–5.11)
RDW: 13.7 % (ref 11.5–15.5)
WBC Count: 11.9 10*3/uL — ABNORMAL HIGH (ref 4.0–10.5)
nRBC: 0 % (ref 0.0–0.2)

## 2019-07-14 LAB — TSH: TSH: 1.809 u[IU]/mL (ref 0.308–3.960)

## 2019-07-14 MED ORDER — SODIUM CHLORIDE 0.9 % IV SOLN
Freq: Once | INTRAVENOUS | Status: AC
  Filled 2019-07-14: qty 250

## 2019-07-14 MED ORDER — CYANOCOBALAMIN 1000 MCG/ML IJ SOLN
1000.0000 ug | Freq: Once | INTRAMUSCULAR | Status: AC
  Administered 2019-07-14: 1000 ug via INTRAMUSCULAR

## 2019-07-14 MED ORDER — SODIUM CHLORIDE 0.9% FLUSH
10.0000 mL | INTRAVENOUS | Status: DC | PRN
  Administered 2019-07-14: 13:00:00 10 mL
  Filled 2019-07-14: qty 10

## 2019-07-14 MED ORDER — ALTEPLASE 2 MG IJ SOLR
INTRAMUSCULAR | Status: AC
  Filled 2019-07-14: qty 2

## 2019-07-14 MED ORDER — HEPARIN SOD (PORK) LOCK FLUSH 100 UNIT/ML IV SOLN
500.0000 [IU] | Freq: Once | INTRAVENOUS | Status: AC | PRN
  Administered 2019-07-14: 500 [IU]
  Filled 2019-07-14: qty 5

## 2019-07-14 MED ORDER — SODIUM CHLORIDE 0.9% FLUSH
10.0000 mL | INTRAVENOUS | Status: DC | PRN
  Administered 2019-07-14: 10 mL
  Filled 2019-07-14: qty 10

## 2019-07-14 MED ORDER — SODIUM CHLORIDE 0.9 % IV SOLN
10.1000 mg/kg | Freq: Once | INTRAVENOUS | Status: AC
  Administered 2019-07-14: 620 mg via INTRAVENOUS
  Filled 2019-07-14: qty 10

## 2019-07-14 MED ORDER — ALTEPLASE 2 MG IJ SOLR
2.0000 mg | Freq: Once | INTRAMUSCULAR | Status: AC | PRN
  Administered 2019-07-14: 2 mg
  Filled 2019-07-14: qty 2

## 2019-07-14 NOTE — Patient Instructions (Signed)
[facility] Discharge Instructions for Patients Receiving Chemotherapy  Today you received the following chemotherapy agents: durvalumab.  To help prevent nausea and vomiting after your treatment, we encourage you to take your nausea medication as directed.   If you develop nausea and vomiting that is not controlled by your nausea medication, call the clinic.   BELOW ARE SYMPTOMS THAT SHOULD BE REPORTED IMMEDIATELY:  *FEVER GREATER THAN 100.5 F  *CHILLS WITH OR WITHOUT FEVER  NAUSEA AND VOMITING THAT IS NOT CONTROLLED WITH YOUR NAUSEA MEDICATION  *UNUSUAL SHORTNESS OF BREATH  *UNUSUAL BRUISING OR BLEEDING  TENDERNESS IN MOUTH AND THROAT WITH OR WITHOUT PRESENCE OF ULCERS  *URINARY PROBLEMS  *BOWEL PROBLEMS  UNUSUAL RASH Items with * indicate a potential emergency and should be followed up as soon as possible.  Feel free to call the clinic should you have any questions or concerns. The clinic phone number is [phone].  Please show the CHEMO ALERT CARD at check-in to the Emergency Department and triage nurse.   

## 2019-07-14 NOTE — Progress Notes (Signed)
[place] [phone]   [phone]) [phone]  OFFICE PROGRESS NOTE  [name], MD [place] [address]  DIAGNOSIS:Recurrent non-small cell lung cancer likely squamous cell carcinoma that was initially diagnosed as a stage IIA(T2b, N0, M0) inAugust 2018status post curative stereotactic radiotherapy completed June 21, 2017. The patient presented today with concerning findings for disease recurrence and progression with enlarging and hypermetabolic right upper lobe lung mass presenting as stage IIIa (T3, N0, M0).  PDL 1 expression 0%.  PRIOR THERAPY: 1) Curative stereotactic radiotherapy completed on June 21, 2017. 2) A course of concurrent chemoradiation with chemotherapy consisting of carboplatin for an AUC of 2 and paclitaxel 45 mg/m.  First dose started on 06/23/2018.  Status post 7 cycles.  Last dose of chemotherapy was given August 04, 2018.  CURRENT THERAPY:Consolidation immunotherapy with Imfinzi 10 mg/KG every 2 weeks.  First dose September 09, 2018.  Status post 22 cycles.  INTERVAL HISTORY: [name] 78 y.o. female returns to the clinic today for follow-up visit.  The patient is feeling fine today with no concerning complaints.  She denied having any significant chest pain, shortness of breath, cough or hemoptysis.  She denied having any fever or chills.  She has no nausea, vomiting, diarrhea or constipation.  She denied having any headache or visual changes.  She continues to tolerate her treatment with Imfinzi fairly well.  She is here for evaluation before starting cycle #23.  MEDICAL HISTORY: Past Medical History:  Diagnosis Date  . Cancer ([place])    Lung   . Carotid artery occlusion   . Clostridium difficile infection 06/2013  . Diabetes mellitus    takes Janumet daily  . Dyslipidemia    takes Crestor daily  . Gallstones   . GERD (gastroesophageal reflux disease)   . Heart murmur    hx  of  . History of radiation therapy 06/11/17-06/21/17   right lung 50 Gy in 5 fractions  . Hypertension    takes Prinzide and Verapamil daily  . Impaired speech    from stroke  . Neuropathy, diabetic ([place])   . Pneumonia   . PONV (postoperative nausea and vomiting)   . Seizures ([place]) 04/19/2018   recent hospitalization  . Smoker   . Stroke ([place]) 06/25/13  . Vertigo    but doesn't take any meds    ALLERGIES:  is allergic to codeine; benadryl [diphenhydramine]; lipitor [atorvastatin]; and phenergan [promethazine hcl].  MEDICATIONS:  Current Outpatient Medications  Medication Sig Dispense Refill  . aspirin EC 81 MG tablet Take 81 mg by mouth daily.    . Blood Glucose Monitoring Suppl (ONE TOUCH ULTRA 2) w/Device KIT 1 Device by Does not apply route 2 (two) times daily. 1 each 0  . clopidogrel (PLAVIX) 75 MG tablet Take 1 tablet (75 mg total) by mouth daily. 90 tablet 3  . divalproex (DEPAKOTE ER) 500 MG 24 hr tablet Take one tablet daily 30 tablet 6  . glucose blood (ONE TOUCH ULTRA TEST) test strip Use as instructed 100 each 12  . Lancets (ONETOUCH ULTRASOFT) lancets Use as instructed 100 each 12  . lidocaine-prilocaine (EMLA) cream APPLY TO THE AFFECTED AREA DAILY AS NEEDED (Patient taking differently: Apply 1 application topically daily as needed (Pain). ) 30 g 0  . lisinopril (ZESTRIL) 10 MG tablet TAKE 1 TABLET(10 MG) BY MOUTH DAILY 90 tablet 3  . Multiple Vitamins-Minerals (MULTIVITAMIN PO) Take 1 tablet by mouth daily.    Marland Kitchen  pioglitazone (ACTOS) 30 MG tablet Take 1 tablet (30 mg total) by mouth daily. 90 tablet 1  . prochlorperazine (COMPAZINE) 10 MG tablet Take 1 tablet (10 mg total) by mouth every 6 (six) hours as needed for nausea or vomiting. 30 tablet 0  . rosuvastatin (CRESTOR) 20 MG tablet TAKE 1 TABLET BY MOUTH ONCE DAILY 180 tablet 0  . SitaGLIPtin-MetFORMIN HCl (JANUMET XR) 50-1000 MG TB24 Take 1 tablet by mouth daily. 60 tablet 5  . vitamin B-12 (CYANOCOBALAMIN) 1000  MCG tablet Take 1,000 mcg by mouth daily.     No current facility-administered medications for this visit.   Facility-Administered Medications Ordered in Other Visits  Medication Dose Route Frequency Provider Last Rate Last Admin  . heparin lock flush 100 unit/mL  500 Units Intracatheter Once PRN Curt Bears, MD      . sodium chloride flush (NS) 0.9 % injection 10 mL  10 mL Intracatheter PRN Curt Bears, MD        SURGICAL HISTORY:  Past Surgical History:  Procedure Laterality Date  . cataract removed Right   . CHOLECYSTECTOMY    . COLONOSCOPY    . ENDARTERECTOMY Left 07/14/2013   Procedure: ENDARTERECTOMY CAROTID-LEFT;  Surgeon: Elam Dutch, MD;  Location: [facility];  Service: Vascular;  Laterality: Left;  . EYE SURGERY Left    cataract  . IR CV LINE INJECTION  05/13/2019  . IR IMAGING GUIDED PORT INSERTION  09/15/2018  . IR PATIENT EVAL TECH 0-60 MINS  05/08/2019  . IR PERC PLEURAL DRAIN W/INDWELL CATH W/IMG GUIDE  05/23/2017  . KNEE SURGERY Left 9yr ago  . LAPAROSCOPIC PARTIAL COLECTOMY N/A 09/02/2015   Procedure: LAPAROSCOPIC PARTIAL RIGHT COLECTOMY;  Surgeon: ALeighton Ruff MD;  [facility];  Service: General;  Laterality: N/A;  . PATCH ANGIOPLASTY Left 07/14/2013   Procedure: LEFT CAROTID ARTERY PATCH ANGIOPLASTY;  Surgeon: CElam Dutch MD;  [place] [facility]  Service: Vascular;  Laterality: Left;  .Marland KitchenVIDEO BRONCHOSCOPY N/A 06/04/2018   Procedure: VIDEO BRONCHOSCOPY;  Surgeon: HMelrose Nakayama MD;  [facility];  Service: Thoracic;  Laterality: N/A;  . wisdom      REVIEW OF SYSTEMS:  A comprehensive review of systems was negative.   PHYSICAL EXAMINATION: General appearance: alert, cooperative and no distress Head: Normocephalic, without obvious abnormality, atraumatic Neck: no adenopathy, no JVD, supple, symmetrical, trachea midline and thyroid not enlarged, symmetric, no tenderness/mass/nodules Lymph nodes: Cervical, supraclavicular, and  axillary nodes normal. Resp: clear to auscultation bilaterally Back: symmetric, no curvature. ROM normal. No CVA tenderness. Cardio: regular rate and rhythm, S1, S2 normal, no murmur, click, rub or gallop GI: soft, non-tender; bowel sounds normal; no masses,  no organomegaly Extremities: extremities normal, atraumatic, no cyanosis or edema  ECOG PERFORMANCE STATUS: 1 - Symptomatic but completely ambulatory  Blood pressure (!) 146/72, pulse 92, temperature 98.7 F (37.1 C), temperature source Oral, resp. rate 20, height '5\' 6"'  (1.676 m), weight 138 lb 6.4 oz (62.8 kg), SpO2 98 %.  LABORATORY DATA: Lab Results  Component Value Date   WBC 11.3 (H) 06/30/2019   HGB 12.0 06/30/2019   HCT 37.5 06/30/2019   MCV 89.9 06/30/2019   PLT 504 (H) 06/30/2019      Chemistry      Component Value Date/Time   NA 138 06/30/2019 1322   NA 139 05/07/2018 1147   K 3.7 06/30/2019 1322   CL 100 06/30/2019 1322   CO2 26 06/30/2019 1322   BUN 15 06/30/2019 1322  BUN 15 05/07/2018 1147   CREATININE 0.83 06/30/2019 1322   CREATININE 0.87 03/08/2015 0001      Component Value Date/Time   CALCIUM 9.4 06/30/2019 1322   ALKPHOS 73 06/30/2019 1322   AST 11 (L) 06/30/2019 1322   ALT 8 06/30/2019 1322   BILITOT 0.5 06/30/2019 1322       RADIOGRAPHIC STUDIES: No results found.  ASSESSMENT AND PLAN: This is a very pleasant 78 years old white female with recurrent non-small cell lung cancer presented as a stage IIIa involving the right lower lobe. The patient underwent a course of concurrent chemoradiation with weekly carboplatin and paclitaxel status post 7 cycles.  She tolerated this treatment well. She had repeat CT scan of the chest performed recently.  I personally and independently reviewed the scan images and discussed the results with the patient and her daughter. Her scan showed improvement of the right lung mass with no other concerning findings for progression. The patient was started on  treatment with consolidation immunotherapy with Imfinzi status post 22 cycles.   The patient continues to tolerate this treatment well with no concerns. I recommended for her to proceed with cycle #23 today as planned. She will come back for follow-up visit in 2 weeks for evaluation before starting cycle #24. The patient was advised to call immediately if she has any concerning symptoms in the interval. The patient voices understanding of current disease status and treatment options and is in agreement with the current care plan. All questions were answered. The patient knows to call the clinic with any problems, questions or concerns. We can certainly see the patient much sooner if necessary.  Disclaimer: This note was dictated with voice recognition software. Similar sounding words can inadvertently be transcribed and may not be corrected upon review.

## 2019-07-14 NOTE — Progress Notes (Signed)
Unable to get blood return from port. Cathflo administered by [employer]. Patient sent back to lab to get blood drawn from arm.

## 2019-07-15 ENCOUNTER — Telehealth: Payer: Self-pay | Admitting: Internal Medicine

## 2019-07-15 NOTE — Telephone Encounter (Signed)
Scheduled per 12/29 los. Called and left msg.

## 2019-07-28 ENCOUNTER — Other Ambulatory Visit: Payer: Self-pay

## 2019-07-28 ENCOUNTER — Inpatient Hospital Stay: Payer: Medicare Other

## 2019-07-28 ENCOUNTER — Encounter: Payer: Self-pay | Admitting: Physician Assistant

## 2019-07-28 ENCOUNTER — Inpatient Hospital Stay: Payer: Medicare Other | Attending: Internal Medicine | Admitting: Physician Assistant

## 2019-07-28 VITALS — BP 138/78 | HR 88 | Temp 99.1°F | Resp 18 | Ht 66.0 in | Wt 139.5 lb

## 2019-07-28 DIAGNOSIS — Z7982 Long term (current) use of aspirin: Secondary | ICD-10-CM | POA: Diagnosis not present

## 2019-07-28 DIAGNOSIS — Z9221 Personal history of antineoplastic chemotherapy: Secondary | ICD-10-CM | POA: Insufficient documentation

## 2019-07-28 DIAGNOSIS — Z95828 Presence of other vascular implants and grafts: Secondary | ICD-10-CM

## 2019-07-28 DIAGNOSIS — Z5112 Encounter for antineoplastic immunotherapy: Secondary | ICD-10-CM | POA: Insufficient documentation

## 2019-07-28 DIAGNOSIS — Z87891 Personal history of nicotine dependence: Secondary | ICD-10-CM | POA: Diagnosis not present

## 2019-07-28 DIAGNOSIS — C3411 Malignant neoplasm of upper lobe, right bronchus or lung: Secondary | ICD-10-CM | POA: Diagnosis not present

## 2019-07-28 DIAGNOSIS — C3491 Malignant neoplasm of unspecified part of right bronchus or lung: Secondary | ICD-10-CM | POA: Diagnosis not present

## 2019-07-28 DIAGNOSIS — Z7984 Long term (current) use of oral hypoglycemic drugs: Secondary | ICD-10-CM | POA: Insufficient documentation

## 2019-07-28 DIAGNOSIS — E1140 Type 2 diabetes mellitus with diabetic neuropathy, unspecified: Secondary | ICD-10-CM | POA: Insufficient documentation

## 2019-07-28 DIAGNOSIS — I10 Essential (primary) hypertension: Secondary | ICD-10-CM | POA: Diagnosis not present

## 2019-07-28 DIAGNOSIS — Z923 Personal history of irradiation: Secondary | ICD-10-CM | POA: Diagnosis not present

## 2019-07-28 DIAGNOSIS — Z79899 Other long term (current) drug therapy: Secondary | ICD-10-CM | POA: Diagnosis not present

## 2019-07-28 DIAGNOSIS — E785 Hyperlipidemia, unspecified: Secondary | ICD-10-CM | POA: Diagnosis not present

## 2019-07-28 DIAGNOSIS — Z8673 Personal history of transient ischemic attack (TIA), and cerebral infarction without residual deficits: Secondary | ICD-10-CM | POA: Diagnosis not present

## 2019-07-28 LAB — CBC WITH DIFFERENTIAL (CANCER CENTER ONLY)
Abs Immature Granulocytes: 0.02 10*3/uL (ref 0.00–0.07)
Basophils Absolute: 0 10*3/uL (ref 0.0–0.1)
Basophils Relative: 0 %
Eosinophils Absolute: 0.3 10*3/uL (ref 0.0–0.5)
Eosinophils Relative: 3 %
HCT: 36.1 % (ref 36.0–46.0)
Hemoglobin: 11.9 g/dL — ABNORMAL LOW (ref 12.0–15.0)
Immature Granulocytes: 0 %
Lymphocytes Relative: 13 %
Lymphs Abs: 1.4 10*3/uL (ref 0.7–4.0)
MCH: 29.1 pg (ref 26.0–34.0)
MCHC: 33 g/dL (ref 30.0–36.0)
MCV: 88.3 fL (ref 80.0–100.0)
Monocytes Absolute: 0.8 10*3/uL (ref 0.1–1.0)
Monocytes Relative: 7 %
Neutro Abs: 8.7 10*3/uL — ABNORMAL HIGH (ref 1.7–7.7)
Neutrophils Relative %: 77 %
Platelet Count: 493 10*3/uL — ABNORMAL HIGH (ref 150–400)
RBC: 4.09 MIL/uL (ref 3.87–5.11)
RDW: 13.9 % (ref 11.5–15.5)
WBC Count: 11.3 10*3/uL — ABNORMAL HIGH (ref 4.0–10.5)
nRBC: 0 % (ref 0.0–0.2)

## 2019-07-28 LAB — CMP (CANCER CENTER ONLY)
ALT: 7 U/L (ref 0–44)
AST: 9 U/L — ABNORMAL LOW (ref 15–41)
Albumin: 3.2 g/dL — ABNORMAL LOW (ref 3.5–5.0)
Alkaline Phosphatase: 76 U/L (ref 38–126)
Anion gap: 12 (ref 5–15)
BUN: 14 mg/dL (ref 8–23)
CO2: 26 mmol/L (ref 22–32)
Calcium: 9.4 mg/dL (ref 8.9–10.3)
Chloride: 100 mmol/L (ref 98–111)
Creatinine: 0.95 mg/dL (ref 0.44–1.00)
GFR, Est AFR Am: >60 mL/min (ref >60)
GFR, Estimated: 57 mL/min — ABNORMAL LOW (ref >60)
Glucose, Bld: 214 mg/dL — ABNORMAL HIGH (ref 70–99)
Potassium: 4 mmol/L (ref 3.5–5.1)
Sodium: 138 mmol/L (ref 135–145)
Total Bilirubin: 0.4 mg/dL (ref 0.3–1.2)
Total Protein: 6.7 g/dL (ref 6.5–8.1)

## 2019-07-28 MED ORDER — SODIUM CHLORIDE 0.9% FLUSH
10.0000 mL | INTRAVENOUS | Status: DC | PRN
  Administered 2019-07-28: 10 mL
  Filled 2019-07-28: qty 10

## 2019-07-28 MED ORDER — SODIUM CHLORIDE 0.9 % IV SOLN
10.1000 mg/kg | Freq: Once | INTRAVENOUS | Status: AC
  Administered 2019-07-28: 620 mg via INTRAVENOUS
  Filled 2019-07-28: qty 10

## 2019-07-28 MED ORDER — HEPARIN SOD (PORK) LOCK FLUSH 100 UNIT/ML IV SOLN
500.0000 [IU] | Freq: Once | INTRAVENOUS | Status: AC | PRN
  Administered 2019-07-28: 500 [IU]
  Filled 2019-07-28: qty 5

## 2019-07-28 MED ORDER — SODIUM CHLORIDE 0.9 % IV SOLN
Freq: Once | INTRAVENOUS | Status: AC
  Filled 2019-07-28: qty 250

## 2019-07-28 NOTE — Patient Instructions (Signed)
[facility] Discharge Instructions for Patients Receiving Chemotherapy  Today you received the following chemotherapy agents: Imfinzi.  To help prevent nausea and vomiting after your treatment, we encourage you to take your nausea medication as directed.   If you develop nausea and vomiting that is not controlled by your nausea medication, call the clinic.   BELOW ARE SYMPTOMS THAT SHOULD BE REPORTED IMMEDIATELY:  *FEVER GREATER THAN 100.5 F  *CHILLS WITH OR WITHOUT FEVER  NAUSEA AND VOMITING THAT IS NOT CONTROLLED WITH YOUR NAUSEA MEDICATION  *UNUSUAL SHORTNESS OF BREATH  *UNUSUAL BRUISING OR BLEEDING  TENDERNESS IN MOUTH AND THROAT WITH OR WITHOUT PRESENCE OF ULCERS  *URINARY PROBLEMS  *BOWEL PROBLEMS  UNUSUAL RASH Items with * indicate a potential emergency and should be followed up as soon as possible.  Feel free to call the clinic should you have any questions or concerns. The clinic phone number is [phone].  Please show the CHEMO ALERT CARD at check-in to the Emergency Department and triage nurse.   

## 2019-07-28 NOTE — Progress Notes (Signed)
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 The patient presented today with concerning findings for disease recurrence and progression with enlarging and hypermetabolic right upper lobe Walker mass presenting as stage IIIa (T3, N0, M0).  PDL 1 expression 0%.  PRIOR THERAPY:  1) Curative stereotactic radiotherapy completed on June 21, 2017. 2) A course of concurrent chemoradiation with chemotherapy consisting of carboplatin for an AUC of 2 and paclitaxel 45 mg/m.First dose started on 06/23/2018.Status post 7 cycles. Last dose of chemotherapy was given August 04, 2018. 3)3)SBRT to the enlarging left lower lobe pulmonary nodule under the care of Dr. Sondra Come. Last radiation treatment scheduled for 03/26/2019  CURRENT THERAPY: Consolidation immunotherapy with Imfinzi 10 mg/KG every 2 weeks. First dose September 09, 2018. Status post23cycles.  INTERVAL HISTORY: [name] 79 y.o. female returnsreturnsto the clinic for a follow up visit. The patient is feeling well today without any concerning complaints. The patient continues to tolerate treatment with immunotherapy with Imfinziwell without any adverse sideeffects. Denies any fever, chills, night sweats, or weight loss. Denies any chest pain, cough, or hemoptysis.She reports her baseline shortness of breath.Denies any nausea, vomiting, diarrhea, or constipation. Denies any headache or visual changes. Denies any rashes or skin changes. She is here for evaluation before starting cycle #24  MEDICAL HISTORY: Past Medical History:  Diagnosis Date  . Cancer ([place])    Walker   . Carotid artery occlusion   . Clostridium difficile infection  06/2013  . Diabetes mellitus    takes Janumet daily  . Dyslipidemia    takes Crestor daily  . Gallstones   . GERD (gastroesophageal reflux disease)   . Heart murmur    hx of  . History of radiation therapy 06/11/17-06/21/17   right Walker 50 Gy in 5 fractions  . Hypertension    takes Prinzide and Verapamil daily  . Impaired speech    from stroke  . Neuropathy, diabetic ([place])   . Pneumonia   . PONV (postoperative nausea and vomiting)   . Seizures ([place]) 04/19/2018   recent hospitalization  . Smoker   . Stroke ([place]) 06/25/13  . Vertigo    but doesn't take any meds    ALLERGIES:  is allergic to codeine; benadryl [diphenhydramine]; lipitor [atorvastatin]; and phenergan [promethazine hcl].  MEDICATIONS:  Current Outpatient Medications  Medication Sig Dispense Refill  . aspirin EC 81 MG tablet Take 81 mg by mouth daily.    . Blood Glucose Monitoring Suppl (ONE TOUCH ULTRA 2) w/Device KIT 1 Device by Does not apply route 2 (two) times daily. 1 each 0  . clopidogrel (PLAVIX) 75 MG tablet Take 1 tablet (75 mg total) by mouth daily. 90 tablet 3  . divalproex (DEPAKOTE ER) 500 MG 24 hr tablet Take one tablet daily 30 tablet 6  . glucose blood (ONE TOUCH ULTRA TEST) test strip Use as instructed 100 each 12  . Lancets (ONETOUCH ULTRASOFT) lancets Use as instructed 100 each 12  . lidocaine-prilocaine (EMLA) cream APPLY TO THE AFFECTED AREA DAILY AS NEEDED (Patient taking differently: Apply 1 application topically daily as needed (Pain). ) 30 g 0  . lisinopril (ZESTRIL) 10 MG tablet TAKE 1 TABLET(10 MG) BY MOUTH DAILY 90 tablet 3  . Multiple Vitamins-Minerals (MULTIVITAMIN PO) Take 1  tablet by mouth daily.    . pioglitazone (ACTOS) 30 MG tablet Take 1 tablet (30 mg total) by mouth daily. 90 tablet 1  . prochlorperazine (COMPAZINE) 10 MG tablet Take 1 tablet (10 mg total) by mouth every 6 (six) hours as needed for nausea or vomiting. 30 tablet 0  . rosuvastatin (CRESTOR) 20 MG tablet TAKE 1  TABLET BY MOUTH ONCE DAILY 180 tablet 0  . SitaGLIPtin-MetFORMIN HCl (JANUMET XR) 50-1000 MG TB24 Take 1 tablet by mouth daily. 60 tablet 5  . vitamin B-12 (CYANOCOBALAMIN) 1000 MCG tablet Take 1,000 mcg by mouth daily.     No current facility-administered medications for this visit.   Facility-Administered Medications Ordered in Other Visits  Medication Dose Route Frequency Provider Last Rate Last Admin  . heparin lock flush 100 unit/mL  500 Units Intracatheter Once PRN Curt Bears, MD      . sodium chloride flush (NS) 0.9 % injection 10 mL  10 mL Intracatheter PRN Curt Bears, MD        SURGICAL HISTORY:  Past Surgical History:  Procedure Laterality Date  . cataract removed Right   . CHOLECYSTECTOMY    . COLONOSCOPY    . ENDARTERECTOMY Left 07/14/2013   Procedure: ENDARTERECTOMY CAROTID-LEFT;  Surgeon: Elam Dutch, MD;  Location: [facility];  Service: Vascular;  Laterality: Left;  . EYE SURGERY Left    cataract  . IR CV LINE INJECTION  05/13/2019  . IR IMAGING GUIDED PORT INSERTION  09/15/2018  . IR PATIENT EVAL TECH 0-60 MINS  05/08/2019  . IR PERC PLEURAL DRAIN W/INDWELL CATH W/IMG GUIDE  05/23/2017  . KNEE SURGERY Left 27yr ago  . LAPAROSCOPIC PARTIAL COLECTOMY N/A 09/02/2015   Procedure: LAPAROSCOPIC PARTIAL RIGHT COLECTOMY;  Surgeon: ALeighton Ruff MD;  Location: [facility];  Service: General;  Laterality: N/A;  . PATCH ANGIOPLASTY Left 07/14/2013   Procedure: LEFT CAROTID ARTERY PATCH ANGIOPLASTY;  Surgeon: CElam Dutch MD;  [place] [facility]  Service: [place];  Laterality: Left;  .Marland KitchenVIDEO BRONCHOSCOPY N/A 06/04/2018   Procedure: VIDEO BRONCHOSCOPY;  Surgeon: HMelrose Nakayama MD;  [facility];  Service: Thoracic;  Laterality: N/A;  . wisdom      REVIEW OF SYSTEMS:   Review of Systems  Constitutional: Negative for appetite change, chills, fatigue, fever and unexpected weight change.  HENT:   Negative for mouth sores, nosebleeds, sore throat and trouble  swallowing.   Eyes: Negative for eye problems and icterus.  Respiratory: Negative for cough, hemoptysis, shortness of breath and wheezing.  Cardiovascular: Negative for chest pain and leg swelling.  Gastrointestinal: Negative for abdominal pain, constipation, diarrhea, nausea and vomiting.  Genitourinary: Negative for bladder incontinence, difficulty urinating, dysuria, frequency and hematuria.   Musculoskeletal: Negative for back pain, gait problem, neck pain and neck stiffness.  Skin: Negative for itching and rash.  Neurological: Negative for dizziness, extremity weakness, gait problem, headaches, light-headedness and seizures.  Hematological: Negative for adenopathy. Does not bruise/bleed easily.  Psychiatric/Behavioral: Negative for confusion, depression and sleep disturbance. The patient is not nervous/anxious.     PHYSICAL EXAMINATION:  Blood pressure 138/78, pulse 88, temperature 99.1 F (37.3 C), temperature source Temporal, resp. rate 18, height '5\' 6"'  (1.676 m), weight 139 lb 8 oz (63.3 kg), SpO2 100 %.  ECOG PERFORMANCE STATUS: 1 - Symptomatic but completely ambulatory  Physical Exam  Constitutional: Oriented to person, place, and time and well-developed, well-nourished, and in no distress. No distress.  HENT:  Head: Normocephalic and atraumatic.  Mouth/Throat: Oropharynx is clear and moist. No oropharyngeal exudate.  Eyes: Conjunctivae are normal. Right eye exhibits no discharge. Left eye exhibits no discharge. No scleral icterus.  Neck: Normal range of motion. Neck supple.  Cardiovascular: Normal rate, regular rhythm, normal heart sounds and intact distal pulses.   Pulmonary/Chest: Effort normal and breath sounds normal. No respiratory distress. No wheezes. No rales.  Abdominal: Soft. Bowel sounds are normal. Exhibits no distension and no mass. There is no tenderness.  Musculoskeletal: Normal range of motion. Exhibits no edema.  Lymphadenopathy:    No cervical adenopathy.   Neurological: Alert and oriented to person, place, and time. Exhibits normal muscle tone. Gait normal. Coordination normal.  Skin: Skin is warm and dry. No rash noted. Not diaphoretic. No erythema. No pallor.  Psychiatric: Mood, memory and judgment normal.  Vitals reviewed.  LABORATORY DATA: Lab Results  Component Value Date   WBC 11.3 (H) 07/28/2019   HGB 11.9 (L) 07/28/2019   HCT 36.1 07/28/2019   MCV 88.3 07/28/2019   PLT 493 (H) 07/28/2019      Chemistry      Component Value Date/Time   NA 138 07/28/2019 1130   NA 139 05/07/2018 1147   K 4.0 07/28/2019 1130   CL 100 07/28/2019 1130   CO2 26 07/28/2019 1130   BUN 14 07/28/2019 1130   BUN 15 05/07/2018 1147   CREATININE 0.95 07/28/2019 1130   CREATININE 0.87 03/08/2015 0001      Component Value Date/Time   CALCIUM 9.4 07/28/2019 1130   ALKPHOS 76 07/28/2019 1130   AST 9 (L) 07/28/2019 1130   ALT 7 07/28/2019 1130   BILITOT 0.4 07/28/2019 1130       RADIOGRAPHIC STUDIES:  No results found.   ASSESSMENT/PLAN:   This is a very pleasant 79 year old Caucasian female with recurrentstage IIIAnon-small cell Walker cancer, squamous cell carcinomaof the right upper lobe. She presented initially as a stage IIa in August of 2018.  The patient underwent a course of concurrent chemoradiation with weekly carboplatin and paclitaxel. She status post 7 cycles. She tolerated treatment well. She is currently undergoing consolidation immunotherapy with Imfinzi 10 mg/kg IV every 2 weeks. She is status post23cycles. She continues to tolerate her treatment well without any adverse side effects.  She recently completed SBRT under the care of Dr. Sondra Come to the enlarging left pulmonary nodule on 03/26/2019.  Labs were reviewed recommend that she proceed with cycle #24 today as scheduled.  We will see her back for a follow up visit in 2 weeks before starting cycle #25.   The patient was advised to call immediately if she  has any concerning symptoms in the interval. The patient voices understanding of current disease status and treatment options and is in agreement with the current care plan. All questions were answered. The patient knows to call the clinic with any problems, questions or concerns. We can certainly see the patient much sooner if necessary    No orders of the defined types were placed in this encounter.    Nichole Mccardle L Elisea Khader, PA-C 07/28/19

## 2019-07-30 ENCOUNTER — Other Ambulatory Visit: Payer: Self-pay

## 2019-07-30 ENCOUNTER — Ambulatory Visit (INDEPENDENT_AMBULATORY_CARE_PROVIDER_SITE_OTHER): Payer: Medicare Other | Admitting: Neurology

## 2019-07-30 ENCOUNTER — Encounter: Payer: Self-pay | Admitting: Neurology

## 2019-07-30 VITALS — BP 125/71 | HR 90 | Temp 98.4°F | Ht 66.0 in | Wt 138.0 lb

## 2019-07-30 DIAGNOSIS — R200 Anesthesia of skin: Secondary | ICD-10-CM | POA: Diagnosis not present

## 2019-07-30 MED ORDER — DIVALPROEX SODIUM ER 500 MG PO TB24: ORAL_TABLET | ORAL | 6 refills | Status: DC

## 2019-07-30 NOTE — Progress Notes (Signed)
[facility] [address] [place] [address] [phone]       OFFICE FOLLOW UP VISIT NOTE  [name] Date of Birth:  15-Aug-1940 Medical Record Number:  [id number]   Referring MD: Murray Hodgkins  Reason for Referral: Numbness  DXA:JOINOMV consult: 05/07/2018 : [name] is a pleasant 79 year old Caucasian lady with past medical history of left frontal MCA branch infarct in December 2014, hypertension, hyperlipidemia, diabetes, diabetic neuropathy who has been having recurrent episodes of transient face and left hand numbness.  History is obtained from the patient and daughter and review of electronic medical records.  I have personally reviewed imaging films.  The daughter states that since April 2019 she has been having frequent episodes in which she starts with slight blurred vision in the left eye followed by her left cheek and left hand numbness.  Her left hand goes to sleep.  She has some trouble speaking because of this episode last typically 15 to 20 minutes.  There is no headache preceding or during these episodes though some of them have been followed by mild headache.  She is fully awake and aware of her surroundings during these episodes.  There are no specific triggers for these episodes and they occur at a variable frequency but may occur several times a month.  She has been hospitalized a few times for these episodes but brain imaging and EEG studies have been unyielding.  Most recently MRI scan of the brain on 04/20/2018 showed old left frontal I per intensity which was felt to be old left frontal infarct.  This was unchanged from prior scans from 2015 though in retrospect when I reviewed the initial MRI from December 2014 which showed the infarct it was much smaller than the present size of the old infarct.  Patient has not had any postcontrast images.  CT angiograms on 04/09/2018 of the brain and neck both showed no significant extracranial or  intracranial stenosis.  Transthoracic echo on 9/26 at 19 was normal.  LDL cholesterol was 36 mg percent and hemoglobin A1c was elevated at 9.4.  EEG done on 04/21/2018 was normal.  These episodes occur at a frequency of every 3 to 4 weeks the mostly involve the left hand and left face but only rarely right hand has also been involved.  The patient feels at times that her left hand may be set drawn up though she denies any weakness or involuntary jerking or movements.  Patient was recently started on Keppra 500 twice daily 2 weeks ago but states she is still had 2 more episodes last Sunday.  Patient has been slightly confused following the last episode and disoriented and daughter feels she has not yet returned back to baseline.  Patient has a history of non-small cell cancer in the right upper lobe of the lung.  She refused surgery and was treated with radiation only.  She recently had a PET scan of the body done this morning the report of which is pending.  She has not been on chemotherapy for her cancer. Update 08/12/2018 : She returns for follow-up after last visit 3 months ago.  She is accompanied by her daughter.  They report no further episodes of paresthesias or other seizure-like of migraine-like episodes.  She is tolerating Depakote ER well without any side effects.  She did have MRI scan of the brain with and without contrast on 06/01/2018 which I personally reviewed showed no acute abnormality or any abnormal enhancing lesion  however she had a left frontal nonspecific white matter hyperintensity which actually appear to be slightly larger in size compared to the previous MRI from January 2015.  She was recently diagnosed with metastatic lung cancer and is presently undergoing chemotherapy and radiation and sees Dr. Julien Nordmann at the cancer center.  She has a follow-up CT scan scheduled on few weeks an appointment to see Dr. Drusilla Kanner to discuss further treatment.  She has no new neurological complaints.  She  continues to have mild short-term memory and cognitive difficulties which appear to be unchanged and are not progressive.  She denies significant headaches or focal neurological deficits. Update 01/27/2019 : She returns for follow-up after last visit 6 months ago.  She is accompanied by her daughter.  Patient states she is doing well she has not had any further episodes of paresthesia or seizure-like episodes since starting Depakote ER which is tolerating well without side effects.  She has discontinued the Keppra without any breakthrough episodes.  She states her short-term memory difficulties and cognitive issues appear stable and not progressive.  She was recently seen by her cancer doctor and right upper lobe necrotic mass appeared to be shrinking but there was a new spot found in the liver.  They are planning on doing MRI surveillance in a few months.  She has no new complaints today. Update 07/30/2019 : She returns for follow-up after last visit 6 months ago.  She is accompanied by her daughter.  She states she is doing well.  She has had no further episodes of recurrent numbness, seizures TIA or strokelike symptoms.  She had a follow-up MRI scan of the brain performed on 02/19/2019 which I personally reviewed shows stable appearance of the left frontal T2/FLAIR hyperintensity which likely represents chronic infarct.  There is no enhancement on postcontrast images.  She remains on aspirin and Plavix which is tolerating well without bruising or bleeding.  Blood pressure is well controlled today it is 125/71.  She states her fasting sugars have all been good.  She remains on Crestor which is tolerating well without muscle aches and pains.  Her last hemoglobin A1c has improved from 9.1 to 8.2 and she is working with a medical doctor.  She is living with her daughter still independent in most activities of daily living.  She does not drive.  She has no complaints today. ROS:   14 system review of systems is  positive for no complaints today, lung cancer and all other systems negative  PMH:  Past Medical History:  Diagnosis Date  . Cancer ([place])    Lung   . Carotid artery occlusion   . Clostridium difficile infection 06/2013  . Diabetes mellitus    takes Janumet daily  . Dyslipidemia    takes Crestor daily  . Gallstones   . GERD (gastroesophageal reflux disease)   . Heart murmur    hx of  . History of radiation therapy 06/11/17-06/21/17   right lung 50 Gy in 5 fractions  . Hypertension    takes Prinzide and Verapamil daily  . Impaired speech    from stroke  . Neuropathy, diabetic ([place])   . Pneumonia   . PONV (postoperative nausea and vomiting)   . Seizures ([place]) 04/19/2018   recent hospitalization  . Smoker   . Stroke ([place]) 06/25/13  . Vertigo    but doesn't take any meds    Social History:  Social History   Socioeconomic History  . Marital status: Widowed  Spouse name: Jeneen Rinks  . Number of children: 3  . Years of education: 10  . Highest education level: Not on file  Occupational History  . Occupation: retired  Tobacco Use  . Smoking status: Current Every Day Smoker    Packs/day: 0.50    Years: 60.00    Pack years: 30.00    Types: Cigarettes  . Smokeless tobacco: Never Used  Substance and Sexual Activity  . Alcohol use: No    Alcohol/week: 0.0 standard drinks  . Drug use: No  . Sexual activity: Yes  Other Topics Concern  . Not on file  Social History Narrative   Patient is married with 3 children.   Patient is right handed.   Patient has 10 th grade education.   Patient drinks 4 cups daily.   Social Determinants of Health   Financial Resource Strain:   . Difficulty of Paying Living Expenses: Not on file  Food Insecurity:   . Worried About Charity fundraiser in the Last Year: Not on file  . Ran Out of Food in the Last Year: Not on file  Transportation Needs:   . Lack of Transportation (Medical): Not on file  . Lack of Transportation (Non-Medical): Not  on file  Physical Activity:   . Days of Exercise per Week: Not on file  . Minutes of Exercise per Session: Not on file  Stress:   . Feeling of Stress : Not on file  Social Connections:   . Frequency of Communication with Friends and Family: Not on file  . Frequency of Social Gatherings with Friends and Family: Not on file  . Attends Religious Services: Not on file  . Active Member of Clubs or Organizations: Not on file  . Attends Archivist Meetings: Not on file  . Marital Status: Not on file  Intimate Partner Violence:   . Fear of Current or Ex-Partner: Not on file  . Emotionally Abused: Not on file  . Physically Abused: Not on file  . Sexually Abused: Not on file    Medications:   Current Outpatient Medications on File Prior to Visit  Medication Sig Dispense Refill  . aspirin EC 81 MG tablet Take 81 mg by mouth daily.    . Blood Glucose Monitoring Suppl (ONE TOUCH ULTRA 2) w/Device KIT 1 Device by Does not apply route 2 (two) times daily. 1 each 0  . glucose blood (ONE TOUCH ULTRA TEST) test strip Use as instructed 100 each 12  . Lancets (ONETOUCH ULTRASOFT) lancets Use as instructed 100 each 12  . lidocaine-prilocaine (EMLA) cream APPLY TO THE AFFECTED AREA DAILY AS NEEDED (Patient taking differently: Apply 1 application topically daily as needed (Pain). ) 30 g 0  . lisinopril (ZESTRIL) 10 MG tablet TAKE 1 TABLET(10 MG) BY MOUTH DAILY 90 tablet 3  . Multiple Vitamins-Minerals (MULTIVITAMIN PO) Take 1 tablet by mouth daily.    . pioglitazone (ACTOS) 30 MG tablet Take 1 tablet (30 mg total) by mouth daily. 90 tablet 1  . prochlorperazine (COMPAZINE) 10 MG tablet Take 1 tablet (10 mg total) by mouth every 6 (six) hours as needed for nausea or vomiting. 30 tablet 0  . rosuvastatin (CRESTOR) 20 MG tablet TAKE 1 TABLET BY MOUTH ONCE DAILY 180 tablet 0  . SitaGLIPtin-MetFORMIN HCl (JANUMET XR) 50-1000 MG TB24 Take 1 tablet by mouth daily. 60 tablet 5  . vitamin B-12  (CYANOCOBALAMIN) 1000 MCG tablet Take 1,000 mcg by mouth daily.     Current  Facility-Administered Medications on File Prior to Visit  Medication Dose Route Frequency Provider Last Rate Last Admin  . heparin lock flush 100 unit/mL  500 Units Intracatheter Once PRN Curt Bears, MD      . sodium chloride flush (NS) 0.9 % injection 10 mL  10 mL Intracatheter PRN Curt Bears, MD        Allergies:   Allergies  Allergen Reactions  . Codeine Nausea And Vomiting  . Benadryl [Diphenhydramine] Other (See Comments)    BENADRYL IV makes pt hallucinate  . Lipitor [Atorvastatin] Nausea And Vomiting  . Phenergan [Promethazine Hcl] Other (See Comments)    Confusion, hallucinations, severe agitation    Physical Exam General: Frail cachectic looking elderly Caucasian lady, seated, in no evident distress Head: head normocephalic and atraumatic.   Neck: supple with no carotid or supraclavicular bruits Cardiovascular: regular rate and rhythm, no murmurs Musculoskeletal: no deformity Skin:  no rash/petichiae Vascular:  Normal pulses all extremities  Neurologic Exam Mental Status: Awake and fully alert. Oriented to place and time. Recent and remote memory poor. Attention span, concentration and fund of knowledge diminished. Mood and affect appropriate.  Mini-Mental status exam not done.  Cranial Nerves: Fundoscopic exam reveals sharp disc margins. Pupils equal, briskly reactive to light. Extraocular movements full without nystagmus. Visual fields full to confrontation. Hearing intact. Facial sensation intact. Face, tongue, palate moves normally and symmetrically.  Motor: Normal bulk and tone. Normal strength in all tested extremity muscles. Sensory.: intact to touch , pinprick , position and vibratory sensation.  Coordination: Rapid alternating movements normal in all extremities. Finger-to-nose and heel-to-shin performed accurately bilaterally. Gait and Station: Arises from chair without  difficulty. Stance is broad-based l. Gait demonstrates mild imbalance.  Unable to stand on either foot unsupported or on a narrow base..  Not able to heel, toe and tandem walk without significant difficulty.  Reflexes: 1+ and symmetric. Toes downgoing.      ASSESSMENT: 79 year old lady with recurrent transient episodes of mostly left face and hand paresthesias of unclear etiology.  which appear to have responded to Depakote.  Recent cognitive impairment and memory difficulties and gait imbalance also appears stable remote history of left frontal MCA branch infarct in December 2014.  She also has history of metastatic lung cancer and is on chemotherapy     PLAN: I had a long discussion with the patient and her daughter regarding her numbness episodes which appear to have quite resolved after starting Depakote hence I recommend she continue Depakote ER 500 mg daily.  I recommend she discontinue Plavix as she has not had a TIA or stroke symptoms for a while and she can stay on aspirin alone and maintain aggressive risk factor modification with strict control of hypertension with blood pressure goal below 130/90, lipids with LDL cholesterol goal below 70 mg percent and diabetes with hemoglobin A1c goal below 6.5%.  She was encouraged to eat a healthy diet and to be active and exercise regularly.  She will return for follow-up in the future in a year or call earlier if necessary..Greater than 50% time during this 25-minute  visit was spent on counseling and coordination of care about her numbness episodes, cognitive impairment and answering questions   Antony Contras, MD  [facility] [address] [place], [place] [address]  Phone [phone] Fax [phone] Note: This document was prepared with digital dictation and possible smart phrase technology. Any transcriptional errors that result from this process are unintentional.

## 2019-07-30 NOTE — Patient Instructions (Signed)
I had a long discussion with the patient and her daughter regarding her numbness episodes which appear to have quite resolved after starting Depakote hence I recommend she continue Depakote ER 500 mg daily.  I recommend she discontinue Plavix as she has not had a TIA or stroke symptoms for a while and she can stay on aspirin alone and maintain aggressive risk factor modification with strict control of hypertension with blood pressure goal below 130/90, lipids with LDL cholesterol goal below 70 mg percent and diabetes with hemoglobin A1c goal below 6.5%.  She was encouraged to eat a healthy diet and to be active and exercise regularly.  She will return for follow-up in the future in a year or call earlier if necessary.

## 2019-08-11 ENCOUNTER — Other Ambulatory Visit (INDEPENDENT_AMBULATORY_CARE_PROVIDER_SITE_OTHER): Payer: Medicare Other

## 2019-08-11 ENCOUNTER — Other Ambulatory Visit: Payer: Self-pay

## 2019-08-11 ENCOUNTER — Inpatient Hospital Stay: Payer: Medicare Other

## 2019-08-11 ENCOUNTER — Inpatient Hospital Stay (HOSPITAL_BASED_OUTPATIENT_CLINIC_OR_DEPARTMENT_OTHER): Payer: Medicare Other | Admitting: Internal Medicine

## 2019-08-11 ENCOUNTER — Encounter: Payer: Self-pay | Admitting: Internal Medicine

## 2019-08-11 VITALS — BP 156/68 | HR 89 | Temp 98.9°F | Resp 20 | Ht 66.0 in | Wt 139.3 lb

## 2019-08-11 DIAGNOSIS — Z5112 Encounter for antineoplastic immunotherapy: Secondary | ICD-10-CM

## 2019-08-11 DIAGNOSIS — C3491 Malignant neoplasm of unspecified part of right bronchus or lung: Secondary | ICD-10-CM | POA: Diagnosis not present

## 2019-08-11 DIAGNOSIS — E785 Hyperlipidemia, unspecified: Secondary | ICD-10-CM | POA: Diagnosis not present

## 2019-08-11 DIAGNOSIS — Z7982 Long term (current) use of aspirin: Secondary | ICD-10-CM | POA: Diagnosis not present

## 2019-08-11 DIAGNOSIS — E538 Deficiency of other specified B group vitamins: Secondary | ICD-10-CM | POA: Diagnosis not present

## 2019-08-11 DIAGNOSIS — I10 Essential (primary) hypertension: Secondary | ICD-10-CM | POA: Diagnosis not present

## 2019-08-11 DIAGNOSIS — Z79899 Other long term (current) drug therapy: Secondary | ICD-10-CM | POA: Diagnosis not present

## 2019-08-11 DIAGNOSIS — G459 Transient cerebral ischemic attack, unspecified: Secondary | ICD-10-CM | POA: Diagnosis not present

## 2019-08-11 DIAGNOSIS — Z9221 Personal history of antineoplastic chemotherapy: Secondary | ICD-10-CM | POA: Diagnosis not present

## 2019-08-11 DIAGNOSIS — Z87891 Personal history of nicotine dependence: Secondary | ICD-10-CM | POA: Diagnosis not present

## 2019-08-11 DIAGNOSIS — Z95828 Presence of other vascular implants and grafts: Secondary | ICD-10-CM

## 2019-08-11 DIAGNOSIS — C3411 Malignant neoplasm of upper lobe, right bronchus or lung: Secondary | ICD-10-CM | POA: Diagnosis not present

## 2019-08-11 DIAGNOSIS — E1140 Type 2 diabetes mellitus with diabetic neuropathy, unspecified: Secondary | ICD-10-CM | POA: Diagnosis not present

## 2019-08-11 DIAGNOSIS — Z8673 Personal history of transient ischemic attack (TIA), and cerebral infarction without residual deficits: Secondary | ICD-10-CM | POA: Diagnosis not present

## 2019-08-11 DIAGNOSIS — Z923 Personal history of irradiation: Secondary | ICD-10-CM | POA: Diagnosis not present

## 2019-08-11 DIAGNOSIS — Z7984 Long term (current) use of oral hypoglycemic drugs: Secondary | ICD-10-CM | POA: Diagnosis not present

## 2019-08-11 LAB — CMP (CANCER CENTER ONLY)
ALT: <6 U/L (ref 0–44)
AST: 8 U/L — ABNORMAL LOW (ref 15–41)
Albumin: 3.3 g/dL — ABNORMAL LOW (ref 3.5–5.0)
Alkaline Phosphatase: 71 U/L (ref 38–126)
Anion gap: 11 (ref 5–15)
BUN: 13 mg/dL (ref 8–23)
CO2: 28 mmol/L (ref 22–32)
Calcium: 9.8 mg/dL (ref 8.9–10.3)
Chloride: 101 mmol/L (ref 98–111)
Creatinine: 0.92 mg/dL (ref 0.44–1.00)
GFR, Est AFR Am: >60 mL/min (ref >60)
GFR, Estimated: 60 mL/min — ABNORMAL LOW (ref >60)
Glucose, Bld: 150 mg/dL — ABNORMAL HIGH (ref 70–99)
Potassium: 4.2 mmol/L (ref 3.5–5.1)
Sodium: 140 mmol/L (ref 135–145)
Total Bilirubin: 0.4 mg/dL (ref 0.3–1.2)
Total Protein: 7 g/dL (ref 6.5–8.1)

## 2019-08-11 LAB — TSH: TSH: 1.736 u[IU]/mL (ref 0.308–3.960)

## 2019-08-11 LAB — CBC WITH DIFFERENTIAL (CANCER CENTER ONLY)
Abs Immature Granulocytes: 0.03 10*3/uL (ref 0.00–0.07)
Basophils Absolute: 0 10*3/uL (ref 0.0–0.1)
Basophils Relative: 0 %
Eosinophils Absolute: 0.3 10*3/uL (ref 0.0–0.5)
Eosinophils Relative: 3 %
HCT: 37.5 % (ref 36.0–46.0)
Hemoglobin: 12 g/dL (ref 12.0–15.0)
Immature Granulocytes: 0 %
Lymphocytes Relative: 15 %
Lymphs Abs: 1.7 10*3/uL (ref 0.7–4.0)
MCH: 27.8 pg (ref 26.0–34.0)
MCHC: 32 g/dL (ref 30.0–36.0)
MCV: 86.8 fL (ref 80.0–100.0)
Monocytes Absolute: 0.9 10*3/uL (ref 0.1–1.0)
Monocytes Relative: 8 %
Neutro Abs: 8.5 10*3/uL — ABNORMAL HIGH (ref 1.7–7.7)
Neutrophils Relative %: 74 %
Platelet Count: 491 10*3/uL — ABNORMAL HIGH (ref 150–400)
RBC: 4.32 MIL/uL (ref 3.87–5.11)
RDW: 14.1 % (ref 11.5–15.5)
WBC Count: 11.4 10*3/uL — ABNORMAL HIGH (ref 4.0–10.5)
nRBC: 0 % (ref 0.0–0.2)

## 2019-08-11 MED ORDER — ALTEPLASE 2 MG IJ SOLR
INTRAMUSCULAR | Status: AC
  Filled 2019-08-11: qty 2

## 2019-08-11 MED ORDER — ALTEPLASE 2 MG IJ SOLR
2.0000 mg | Freq: Once | INTRAMUSCULAR | Status: AC | PRN
  Administered 2019-08-11: 2 mg
  Filled 2019-08-11: qty 2

## 2019-08-11 MED ORDER — SODIUM CHLORIDE 0.9% FLUSH
10.0000 mL | INTRAVENOUS | Status: DC | PRN
  Administered 2019-08-11: 10 mL
  Filled 2019-08-11: qty 10

## 2019-08-11 MED ORDER — SODIUM CHLORIDE 0.9 % IV SOLN
Freq: Once | INTRAVENOUS | Status: AC
  Filled 2019-08-11: qty 250

## 2019-08-11 MED ORDER — HEPARIN SOD (PORK) LOCK FLUSH 100 UNIT/ML IV SOLN
500.0000 [IU] | Freq: Once | INTRAVENOUS | Status: AC | PRN
  Administered 2019-08-11: 500 [IU]
  Filled 2019-08-11: qty 5

## 2019-08-11 MED ORDER — SODIUM CHLORIDE 0.9 % IV SOLN
10.1000 mg/kg | Freq: Once | INTRAVENOUS | Status: AC
  Administered 2019-08-11: 620 mg via INTRAVENOUS
  Filled 2019-08-11: qty 2.4

## 2019-08-11 MED ORDER — CYANOCOBALAMIN 1000 MCG/ML IJ SOLN
1000.0000 ug | Freq: Once | INTRAMUSCULAR | Status: AC
  Administered 2019-08-11: 16:00:00 1000 ug via INTRAMUSCULAR

## 2019-08-11 MED ORDER — SODIUM CHLORIDE 0.9% FLUSH
10.0000 mL | INTRAVENOUS | Status: DC | PRN
  Administered 2019-08-11: 13:00:00 10 mL
  Filled 2019-08-11: qty 10

## 2019-08-11 NOTE — Patient Instructions (Signed)
[facility] Discharge Instructions for Patients Receiving Chemotherapy  Today you received the following chemotherapy agents: Imfinzi.  To help prevent nausea and vomiting after your treatment, we encourage you to take your nausea medication as directed.   If you develop nausea and vomiting that is not controlled by your nausea medication, call the clinic.   BELOW ARE SYMPTOMS THAT SHOULD BE REPORTED IMMEDIATELY:  *FEVER GREATER THAN 100.5 F  *CHILLS WITH OR WITHOUT FEVER  NAUSEA AND VOMITING THAT IS NOT CONTROLLED WITH YOUR NAUSEA MEDICATION  *UNUSUAL SHORTNESS OF BREATH  *UNUSUAL BRUISING OR BLEEDING  TENDERNESS IN MOUTH AND THROAT WITH OR WITHOUT PRESENCE OF ULCERS  *URINARY PROBLEMS  *BOWEL PROBLEMS  UNUSUAL RASH Items with * indicate a potential emergency and should be followed up as soon as possible.  Feel free to call the clinic should you have any questions or concerns. The clinic phone number is [phone].  Please show the CHEMO ALERT CARD at check-in to the Emergency Department and triage nurse.   

## 2019-08-11 NOTE — Progress Notes (Signed)
[facility] [phone]   [phone]  OFFICE PROGRESS NOTE  [name], MD [place] [address]  DIAGNOSIS:Recurrent non-small cell lung cancer likely squamous cell carcinoma that was initially diagnosed as a stage IIA(T2b, N0, M0) inAugust 2018status post curative stereotactic radiotherapy completed June 21, 2017. The patient presented today with concerning findings for disease recurrence and progression with enlarging and hypermetabolic right upper lobe lung mass presenting as stage IIIa (T3, N0, M0).  PDL 1 expression 0%.  PRIOR THERAPY: 1) Curative stereotactic radiotherapy completed on June 21, 2017. 2) A course of concurrent chemoradiation with chemotherapy consisting of carboplatin for an AUC of 2 and paclitaxel 45 mg/m.  First dose started on 06/23/2018.  Status post 7 cycles.  Last dose of chemotherapy was given August 04, 2018.  CURRENT THERAPY:Consolidation immunotherapy with Imfinzi 10 mg/KG every 2 weeks.  First dose September 09, 2018.  Status post 24 cycles.  INTERVAL HISTORY: [name] 79 y.o. female returns to the clinic today for follow-up visit.  The patient is feeling fine today with no concerning complaints but earlier today she had some blurred vision in the right eye.  She has a history of similar episode in the past and she was on Depakote by her neurologist but this was discontinued.  She denied having any current chest pain, shortness of breath, cough or hemoptysis.  She denied having any nausea, vomiting, diarrhea or constipation.  She is here today for evaluation before starting cycle #25 of her treatment.  MEDICAL HISTORY: Past Medical History:  Diagnosis Date  . Cancer ([place])    Lung   . Carotid artery occlusion   . Clostridium difficile infection 06/2013  . Diabetes mellitus    takes Janumet daily  . Dyslipidemia    takes Crestor daily  . Gallstones   . GERD  (gastroesophageal reflux disease)   . Heart murmur    hx of  . History of radiation therapy 06/11/17-06/21/17   right lung 50 Gy in 5 fractions  . Hypertension    takes Prinzide and Verapamil daily  . Impaired speech    from stroke  . Neuropathy, diabetic ([place])   . Pneumonia   . PONV (postoperative nausea and vomiting)   . Seizures ([place]) 04/19/2018   recent hospitalization  . Smoker   . Stroke ([place]) 06/25/13  . Vertigo    but doesn't take any meds    ALLERGIES:  is allergic to codeine; benadryl [diphenhydramine]; lipitor [atorvastatin]; and phenergan [promethazine hcl].  MEDICATIONS:  Current Outpatient Medications  Medication Sig Dispense Refill  . aspirin EC 81 MG tablet Take 81 mg by mouth daily.    . Blood Glucose Monitoring Suppl (ONE TOUCH ULTRA 2) w/Device KIT 1 Device by Does not apply route 2 (two) times daily. 1 each 0  . divalproex (DEPAKOTE ER) 500 MG 24 hr tablet Take one tablet daily 30 tablet 6  . glucose blood (ONE TOUCH ULTRA TEST) test strip Use as instructed 100 each 12  . Lancets (ONETOUCH ULTRASOFT) lancets Use as instructed 100 each 12  . lidocaine-prilocaine (EMLA) cream APPLY TO THE AFFECTED AREA DAILY AS NEEDED (Patient taking differently: Apply 1 application topically daily as needed (Pain). ) 30 g 0  . lisinopril (ZESTRIL) 10 MG tablet TAKE 1 TABLET(10 MG) BY MOUTH DAILY 90 tablet 3  . Multiple Vitamins-Minerals (MULTIVITAMIN PO) Take 1 tablet by mouth daily.    . pioglitazone (ACTOS) 30 MG  tablet Take 1 tablet (30 mg total) by mouth daily. 90 tablet 1  . prochlorperazine (COMPAZINE) 10 MG tablet Take 1 tablet (10 mg total) by mouth every 6 (six) hours as needed for nausea or vomiting. 30 tablet 0  . rosuvastatin (CRESTOR) 20 MG tablet TAKE 1 TABLET BY MOUTH ONCE DAILY 180 tablet 0  . SitaGLIPtin-MetFORMIN HCl (JANUMET XR) 50-1000 MG TB24 Take 1 tablet by mouth daily. 60 tablet 5  . vitamin B-12 (CYANOCOBALAMIN) 1000 MCG tablet Take 1,000 mcg by mouth  daily.     No current facility-administered medications for this visit.   Facility-Administered Medications Ordered in Other Visits  Medication Dose Route Frequency Provider Last Rate Last Admin  . heparin lock flush 100 unit/mL  500 Units Intracatheter Once PRN Curt Bears, MD      . sodium chloride flush (NS) 0.9 % injection 10 mL  10 mL Intracatheter PRN Curt Bears, MD        SURGICAL HISTORY:  Past Surgical History:  Procedure Laterality Date  . cataract removed Right   . CHOLECYSTECTOMY    . COLONOSCOPY    . ENDARTERECTOMY Left 07/14/2013   Procedure: ENDARTERECTOMY CAROTID-LEFT;  Surgeon: Elam Dutch, MD;  Location: [facility];  Service: Vascular;  Laterality: Left;  . EYE SURGERY Left    cataract  . IR CV LINE INJECTION  05/13/2019  . IR IMAGING GUIDED PORT INSERTION  09/15/2018  . IR PATIENT EVAL TECH 0-60 MINS  05/08/2019  . IR PERC PLEURAL DRAIN W/INDWELL CATH W/IMG GUIDE  05/23/2017  . KNEE SURGERY Left 7yr ago  . LAPAROSCOPIC PARTIAL COLECTOMY N/A 09/02/2015   Procedure: LAPAROSCOPIC PARTIAL RIGHT COLECTOMY;  Surgeon: ALeighton Ruff MD;  Location: [facility];  Service: General;  Laterality: N/A;  . PATCH ANGIOPLASTY Left 07/14/2013   Procedure: LEFT CAROTID ARTERY PATCH ANGIOPLASTY;  Surgeon: CElam Dutch MD;  [facility]  Service: Vascular;  Laterality: Left;  .Marland KitchenVIDEO BRONCHOSCOPY N/A 06/04/2018   Procedure: VIDEO BRONCHOSCOPY;  Surgeon: HMelrose Nakayama MD;  [facility];  Service: Thoracic;  Laterality: N/A;  . wisdom      REVIEW OF SYSTEMS:  A comprehensive review of systems was negative except for: Eyes: positive for visual disturbance   PHYSICAL EXAMINATION: General appearance: alert, cooperative and no distress Head: Normocephalic, without obvious abnormality, atraumatic Neck: no adenopathy, no JVD, supple, symmetrical, trachea midline and thyroid not enlarged, symmetric, no tenderness/mass/nodules Lymph nodes: Cervical,  supraclavicular, and axillary nodes normal. Resp: clear to auscultation bilaterally Back: symmetric, no curvature. ROM normal. No CVA tenderness. Cardio: regular rate and rhythm, S1, S2 normal, no murmur, click, rub or gallop GI: soft, non-tender; bowel sounds normal; no masses,  no organomegaly Extremities: extremities normal, atraumatic, no cyanosis or edema  ECOG PERFORMANCE STATUS: 1 - Symptomatic but completely ambulatory  Blood pressure (!) 156/68, pulse 89, temperature 98.9 F (37.2 C), temperature source Oral, resp. rate 20, height _0  (1.676 m), weight 139 lb 4.8 oz (63.2 kg), SpO2 97 %.  LABORATORY DATA: Lab Results  Component Value Date   WBC 11.4 (H) 08/11/2019   HGB 12.0 08/11/2019   HCT 37.5 08/11/2019   MCV 86.8 08/11/2019   PLT 491 (H) 08/11/2019      Chemistry      Component Value Date/Time   NA 138 07/28/2019 1130   NA 139 05/07/2018 1147   K 4.0 07/28/2019 1130   CL 100 07/28/2019 1130   CO2 26 07/28/2019 1130   BUN  14 07/28/2019 1130   BUN 15 05/07/2018 1147   CREATININE 0.95 07/28/2019 1130   CREATININE 0.87 03/08/2015 0001      Component Value Date/Time   CALCIUM 9.4 07/28/2019 1130   ALKPHOS 76 07/28/2019 1130   AST 9 (L) 07/28/2019 1130   ALT 7 07/28/2019 1130   BILITOT 0.4 07/28/2019 1130       RADIOGRAPHIC STUDIES: No results found.  ASSESSMENT AND PLAN: This is a very pleasant 79 years old white female with recurrent non-small cell lung cancer presented as a stage IIIa involving the right lower lobe. The patient underwent a course of concurrent chemoradiation with weekly carboplatin and paclitaxel status post 7 cycles.  She tolerated this treatment well. The patient was started on treatment with consolidation immunotherapy with Imfinzi status post 24 cycles.   The patient has been tolerating this treatment well with no concerning adverse effects. I recommended for her to proceed with cycle #25 today as planned. For the visual  disturbance and history of seizure activity, I recommended for the patient to reach out to her neurologist for reevaluation and management of this condition. I will see her back for follow-up visit in 2 weeks for evaluation before the last cycle of her consolidation immunotherapy. She was advised to call immediately if she has any concerning symptoms in the interval. The patient voices understanding of current disease status and treatment options and is in agreement with the current care plan. All questions were answered. The patient knows to call the clinic with any problems, questions or concerns. We can certainly see the patient much sooner if necessary.  Disclaimer: This note was dictated with voice recognition software. Similar sounding words can inadvertently be transcribed and may not be corrected upon review.

## 2019-08-12 ENCOUNTER — Telehealth: Payer: Self-pay

## 2019-08-12 ENCOUNTER — Other Ambulatory Visit (HOSPITAL_COMMUNITY): Payer: Self-pay | Admitting: Neurology

## 2019-08-12 MED ORDER — DIVALPROEX SODIUM ER 500 MG PO TB24: ORAL_TABLET | ORAL | 6 refills | Status: AC

## 2019-08-12 NOTE — Telephone Encounter (Signed)
Patient daughter [name]  called to advise that patient is having  Eye fluttering again and dizziness and would like to know if she would need to be put back on the plavix or what could be done.   Please follow up

## 2019-08-12 NOTE — Telephone Encounter (Signed)
I spoke with pt and daughter lIsa on speaker phone. She reported pt had a dizzy spell and one eye fluttering again. THe pt stated it happen yesterday and only lasted about 3 minutes. The daughter wanted to know if pt should be back on her plavix that was discontinue by Dr.SEthi at last visit. Pt is on baby aspirin. The pt stated the symptoms resolve. They wanted to know could pt had a stroke. I stated message will be sent to Dr .Leonie Man.They verbalized understanding.

## 2019-08-12 NOTE — Telephone Encounter (Signed)
I returned the patient's call.  She informed me that yesterday while driving to the cancer center she had a 3-minute episode of fluttering of her right eyelid and upper face which was involuntary and she could not stop it.  She had similar episodes in the past lasting longer which were felt to be partial seizures and I had started the patient on Depakote ER and she had done well without episodes until yesterday.  I recommend she increase the dose of Depakote ER to 500 mg twice daily.  If episodes continue may need to check levels.  She and her daughter voiced understanding

## 2019-08-14 ENCOUNTER — Other Ambulatory Visit: Payer: Medicare Other

## 2019-08-24 ENCOUNTER — Other Ambulatory Visit: Payer: Self-pay | Admitting: Medical Oncology

## 2019-08-24 DIAGNOSIS — C3491 Malignant neoplasm of unspecified part of right bronchus or lung: Secondary | ICD-10-CM

## 2019-08-24 DIAGNOSIS — Z5112 Encounter for antineoplastic immunotherapy: Secondary | ICD-10-CM

## 2019-08-25 ENCOUNTER — Encounter: Payer: Self-pay | Admitting: Internal Medicine

## 2019-08-25 ENCOUNTER — Inpatient Hospital Stay: Payer: Medicare Other

## 2019-08-25 ENCOUNTER — Inpatient Hospital Stay: Payer: Medicare Other | Attending: Internal Medicine | Admitting: Internal Medicine

## 2019-08-25 ENCOUNTER — Other Ambulatory Visit: Payer: Self-pay

## 2019-08-25 VITALS — BP 146/68 | HR 92 | Temp 98.7°F | Resp 20 | Ht 66.0 in | Wt 139.8 lb

## 2019-08-25 DIAGNOSIS — Z5112 Encounter for antineoplastic immunotherapy: Secondary | ICD-10-CM

## 2019-08-25 DIAGNOSIS — C3491 Malignant neoplasm of unspecified part of right bronchus or lung: Secondary | ICD-10-CM

## 2019-08-25 DIAGNOSIS — I10 Essential (primary) hypertension: Secondary | ICD-10-CM | POA: Insufficient documentation

## 2019-08-25 DIAGNOSIS — R569 Unspecified convulsions: Secondary | ICD-10-CM | POA: Diagnosis not present

## 2019-08-25 DIAGNOSIS — Z7982 Long term (current) use of aspirin: Secondary | ICD-10-CM | POA: Insufficient documentation

## 2019-08-25 DIAGNOSIS — Z923 Personal history of irradiation: Secondary | ICD-10-CM | POA: Insufficient documentation

## 2019-08-25 DIAGNOSIS — E1140 Type 2 diabetes mellitus with diabetic neuropathy, unspecified: Secondary | ICD-10-CM | POA: Insufficient documentation

## 2019-08-25 DIAGNOSIS — Z79899 Other long term (current) drug therapy: Secondary | ICD-10-CM | POA: Insufficient documentation

## 2019-08-25 DIAGNOSIS — Z8673 Personal history of transient ischemic attack (TIA), and cerebral infarction without residual deficits: Secondary | ICD-10-CM | POA: Diagnosis not present

## 2019-08-25 DIAGNOSIS — F17210 Nicotine dependence, cigarettes, uncomplicated: Secondary | ICD-10-CM | POA: Insufficient documentation

## 2019-08-25 DIAGNOSIS — C3490 Malignant neoplasm of unspecified part of unspecified bronchus or lung: Secondary | ICD-10-CM | POA: Diagnosis not present

## 2019-08-25 DIAGNOSIS — Z7984 Long term (current) use of oral hypoglycemic drugs: Secondary | ICD-10-CM | POA: Insufficient documentation

## 2019-08-25 DIAGNOSIS — Z95828 Presence of other vascular implants and grafts: Secondary | ICD-10-CM

## 2019-08-25 DIAGNOSIS — C3411 Malignant neoplasm of upper lobe, right bronchus or lung: Secondary | ICD-10-CM | POA: Diagnosis not present

## 2019-08-25 LAB — CBC WITH DIFFERENTIAL (CANCER CENTER ONLY)
Abs Immature Granulocytes: 0.06 10*3/uL (ref 0.00–0.07)
Basophils Absolute: 0 10*3/uL (ref 0.0–0.1)
Basophils Relative: 0 %
Eosinophils Absolute: 0.4 10*3/uL (ref 0.0–0.5)
Eosinophils Relative: 3 %
HCT: 35 % — ABNORMAL LOW (ref 36.0–46.0)
Hemoglobin: 11.3 g/dL — ABNORMAL LOW (ref 12.0–15.0)
Immature Granulocytes: 1 %
Lymphocytes Relative: 10 %
Lymphs Abs: 1.1 10*3/uL (ref 0.7–4.0)
MCH: 28.3 pg (ref 26.0–34.0)
MCHC: 32.3 g/dL (ref 30.0–36.0)
MCV: 87.7 fL (ref 80.0–100.0)
Monocytes Absolute: 0.8 10*3/uL (ref 0.1–1.0)
Monocytes Relative: 7 %
Neutro Abs: 9 10*3/uL — ABNORMAL HIGH (ref 1.7–7.7)
Neutrophils Relative %: 79 %
Platelet Count: 405 10*3/uL — ABNORMAL HIGH (ref 150–400)
RBC: 3.99 MIL/uL (ref 3.87–5.11)
RDW: 14.4 % (ref 11.5–15.5)
WBC Count: 11.4 10*3/uL — ABNORMAL HIGH (ref 4.0–10.5)
nRBC: 0 % (ref 0.0–0.2)

## 2019-08-25 LAB — TSH: TSH: 1.78 u[IU]/mL (ref 0.308–3.960)

## 2019-08-25 LAB — CMP (CANCER CENTER ONLY)
ALT: 6 U/L (ref 0–44)
AST: 8 U/L — ABNORMAL LOW (ref 15–41)
Albumin: 3 g/dL — ABNORMAL LOW (ref 3.5–5.0)
Alkaline Phosphatase: 62 U/L (ref 38–126)
Anion gap: 8 (ref 5–15)
BUN: 16 mg/dL (ref 8–23)
CO2: 29 mmol/L (ref 22–32)
Calcium: 9.1 mg/dL (ref 8.9–10.3)
Chloride: 103 mmol/L (ref 98–111)
Creatinine: 0.93 mg/dL (ref 0.44–1.00)
GFR, Est AFR Am: >60 mL/min (ref >60)
GFR, Estimated: 59 mL/min — ABNORMAL LOW (ref >60)
Glucose, Bld: 155 mg/dL — ABNORMAL HIGH (ref 70–99)
Potassium: 3.8 mmol/L (ref 3.5–5.1)
Sodium: 140 mmol/L (ref 135–145)
Total Bilirubin: 0.3 mg/dL (ref 0.3–1.2)
Total Protein: 6.6 g/dL (ref 6.5–8.1)

## 2019-08-25 MED ORDER — SODIUM CHLORIDE 0.9 % IV SOLN
Freq: Once | INTRAVENOUS | Status: AC
  Filled 2019-08-25: qty 250

## 2019-08-25 MED ORDER — SODIUM CHLORIDE 0.9 % IV SOLN
10.0000 mg/kg | Freq: Once | INTRAVENOUS | Status: AC
  Administered 2019-08-25: 620 mg via INTRAVENOUS
  Filled 2019-08-25: qty 10

## 2019-08-25 MED ORDER — SODIUM CHLORIDE 0.9% FLUSH
10.0000 mL | INTRAVENOUS | Status: DC | PRN
  Administered 2019-08-25: 10 mL
  Filled 2019-08-25: qty 10

## 2019-08-25 MED ORDER — HEPARIN SOD (PORK) LOCK FLUSH 100 UNIT/ML IV SOLN
500.0000 [IU] | Freq: Once | INTRAVENOUS | Status: AC | PRN
  Administered 2019-08-25: 14:00:00 500 [IU]
  Filled 2019-08-25: qty 5

## 2019-08-25 NOTE — Patient Instructions (Signed)

## 2019-08-25 NOTE — Patient Instructions (Signed)
[facility] Discharge Instructions for Patients Receiving Chemotherapy  Today you received the following chemotherapy agents: Imfinzi.  To help prevent nausea and vomiting after your treatment, we encourage you to take your nausea medication as directed.   If you develop nausea and vomiting that is not controlled by your nausea medication, call the clinic.   BELOW ARE SYMPTOMS THAT SHOULD BE REPORTED IMMEDIATELY:  *FEVER GREATER THAN 100.5 F  *CHILLS WITH OR WITHOUT FEVER  NAUSEA AND VOMITING THAT IS NOT CONTROLLED WITH YOUR NAUSEA MEDICATION  *UNUSUAL SHORTNESS OF BREATH  *UNUSUAL BRUISING OR BLEEDING  TENDERNESS IN MOUTH AND THROAT WITH OR WITHOUT PRESENCE OF ULCERS  *URINARY PROBLEMS  *BOWEL PROBLEMS  UNUSUAL RASH Items with * indicate a potential emergency and should be followed up as soon as possible.  Feel free to call the clinic should you have any questions or concerns. The clinic phone number is [phone].  Please show the CHEMO ALERT CARD at check-in to the Emergency Department and triage nurse.   

## 2019-08-25 NOTE — Patient Instructions (Signed)
Steps to Quit Smoking Smoking tobacco is the leading cause of preventable death. It can affect almost every organ in the body. Smoking puts you and people around you at risk for many serious, long-lasting (chronic) diseases. Quitting smoking can be hard, but it is one of the best things that you can do for your health. It is never too late to quit. How do I get ready to quit? When you decide to quit smoking, make a plan to help you succeed. Before you quit:  Pick a date to quit. Set a date within the next 2 weeks to give you time to prepare.  Write down the reasons why you are quitting. Keep this list in places where you will see it often.  Tell your family, friends, and co-workers that you are quitting. Their support is important.  Talk with your doctor about the choices that may help you quit.  Find out if your health insurance will pay for these treatments.  Know the people, places, things, and activities that make you want to smoke (triggers). Avoid them. What first steps can I take to quit smoking?  Throw away all cigarettes at home, at work, and in your car.  Throw away the things that you use when you smoke, such as ashtrays and lighters.  Clean your car. Make sure to empty the ashtray.  Clean your home, including curtains and carpets. What can I do to help me quit smoking? Talk with your doctor about taking medicines and seeing a counselor at the same time. You are more likely to succeed when you do both.  If you are pregnant or breastfeeding, talk with your doctor about counseling or other ways to quit smoking. Do not take medicine to help you quit smoking unless your doctor tells you to do so. To quit smoking: Quit right away  Quit smoking totally, instead of slowly cutting back on how much you smoke over a period of time.  Go to counseling. You are more likely to quit if you go to counseling sessions regularly. Take medicine You may take medicines to help you quit. Some  medicines need a prescription, and some you can buy over-the-counter. Some medicines may contain a drug called nicotine to replace the nicotine in cigarettes. Medicines may:  Help you to stop having the desire to smoke (cravings).  Help to stop the problems that come when you stop smoking (withdrawal symptoms). Your doctor may ask you to use:  Nicotine patches, gum, or lozenges.  Nicotine inhalers or sprays.  Non-nicotine medicine that is taken by mouth. Find resources Find resources and other ways to help you quit smoking and remain smoke-free after you quit. These resources are most helpful when you use them often. They include:  Online chats with a counselor.  Phone quitlines.  Printed self-help materials.  Support groups or group counseling.  Text messaging programs.  Mobile phone apps. Use apps on your mobile phone or tablet that can help you stick to your quit plan. There are many free apps for mobile phones and tablets as well as websites. Examples include Quit Guide from the CDC and smokefree.gov  What things can I do to make it easier to quit?   Talk to your family and friends. Ask them to support and encourage you.  Call a phone quitline (1-800-QUIT-NOW), reach out to support groups, or work with a counselor.  Ask people who smoke to not smoke around you.  Avoid places that make you want to smoke,   such as: ? Bars. ? Parties. ? Smoke-break areas at work.  Spend time with people who do not smoke.  Lower the stress in your life. Stress can make you want to smoke. Try these things to help your stress: ? Getting regular exercise. ? Doing deep-breathing exercises. ? Doing yoga. ? Meditating. ? Doing a body scan. To do this, close your eyes, focus on one area of your body at a time from head to toe. Notice which parts of your body are tense. Try to relax the muscles in those areas. How will I feel when I quit smoking? Day 1 to 3 weeks Within the first 24 hours,  you may start to have some problems that come from quitting tobacco. These problems are very bad 2-3 days after you quit, but they do not often last for more than 2-3 weeks. You may get these symptoms:  Mood swings.  Feeling restless, nervous, angry, or annoyed.  Trouble concentrating.  Dizziness.  Strong desire for high-sugar foods and nicotine.  Weight gain.  Trouble pooping (constipation).  Feeling like you may vomit (nausea).  Coughing or a sore throat.  Changes in how the medicines that you take for other issues work in your body.  Depression.  Trouble sleeping (insomnia). Week 3 and afterward After the first 2-3 weeks of quitting, you may start to notice more positive results, such as:  Better sense of smell and taste.  Less coughing and sore throat.  Slower heart rate.  Lower blood pressure.  Clearer skin.  Better breathing.  Fewer sick days. Quitting smoking can be hard. Do not give up if you fail the first time. Some people need to try a few times before they succeed. Do your best to stick to your quit plan, and talk with your doctor if you have any questions or concerns. Summary  Smoking tobacco is the leading cause of preventable death. Quitting smoking can be hard, but it is one of the best things that you can do for your health.  When you decide to quit smoking, make a plan to help you succeed.  Quit smoking right away, not slowly over a period of time.  When you start quitting, seek help from your doctor, family, or friends. This information is not intended to replace advice given to you by your health care provider. Make sure you discuss any questions you have with your health care provider. Document Revised: 03/27/2019 Document Reviewed: 09/20/2018 Elsevier Patient Education  2020 [employer]vier Inc.  

## 2019-08-25 NOTE — Progress Notes (Signed)
[place] [phone]   [phone]  OFFICE PROGRESS NOTE  [name], MD [place] [id number]  DIAGNOSIS:Recurrent non-small cell lung cancer likely squamous cell carcinoma that was initially diagnosed as a stage IIA(T2b, N0, M0) inAugust 2018status post curative stereotactic radiotherapy completed June 21, 2017. The patient presented today with concerning findings for disease recurrence and progression with enlarging and hypermetabolic right upper lobe lung mass presenting as stage IIIa (T3, N0, M0).  PDL 1 expression 0%.  PRIOR THERAPY: 1) Curative stereotactic radiotherapy completed on June 21, 2017. 2) A course of concurrent chemoradiation with chemotherapy consisting of carboplatin for an AUC of 2 and paclitaxel 45 mg/m.  First dose started on 06/23/2018.  Status post 7 cycles.  Last dose of chemotherapy was given August 04, 2018.  CURRENT THERAPY:Consolidation immunotherapy with Imfinzi 10 mg/KG every 2 weeks.  First dose September 09, 2018.  Status post 25 cycles.  INTERVAL HISTORY: [name] 79 y.o. female returns to the clinic today for follow-up visit.  The patient is feeling fine today with no concerning complaints.  Her neurologist increase her dose of Depakote to twice daily.  The patient denied having any current chest pain, shortness of breath, cough or hemoptysis.  She denied having any nausea, vomiting, diarrhea or constipation.  She has no headache or visual changes.  She continues to smoke around 6 cigarettes every day.  She is here today for evaluation before starting the last cycle of her consolidation treatment with immunotherapy.  MEDICAL HISTORY: Past Medical History:  Diagnosis Date  . Cancer ([place])    Lung   . Carotid artery occlusion   . Clostridium difficile infection 06/2013  . Diabetes mellitus    takes Janumet daily  . Dyslipidemia    takes Crestor daily  .  Gallstones   . GERD (gastroesophageal reflux disease)   . Heart murmur    hx of  . History of radiation therapy 06/11/17-06/21/17   right lung 50 Gy in 5 fractions  . Hypertension    takes Prinzide and Verapamil daily  . Impaired speech    from stroke  . Neuropathy, diabetic ([place])   . Pneumonia   . PONV (postoperative nausea and vomiting)   . Seizures ([place]) 04/19/2018   recent hospitalization  . Smoker   . Stroke ([place]) 06/25/13  . Vertigo    but doesn't take any meds    ALLERGIES:  is allergic to codeine; benadryl [diphenhydramine]; lipitor [atorvastatin]; and phenergan [promethazine hcl].  MEDICATIONS:  Current Outpatient Medications  Medication Sig Dispense Refill  . aspirin EC 81 MG tablet Take 81 mg by mouth daily.    . Blood Glucose Monitoring Suppl (ONE TOUCH ULTRA 2) w/Device KIT 1 Device by Does not apply route 2 (two) times daily. 1 each 0  . divalproex (DEPAKOTE ER) 500 MG 24 hr tablet Take one tablet  twice daily 60 tablet 6  . glucose blood (ONE TOUCH ULTRA TEST) test strip Use as instructed 100 each 12  . Lancets (ONETOUCH ULTRASOFT) lancets Use as instructed 100 each 12  . lidocaine-prilocaine (EMLA) cream APPLY TO THE AFFECTED AREA DAILY AS NEEDED (Patient taking differently: Apply 1 application topically daily as needed (Pain). ) 30 g 0  . lisinopril (ZESTRIL) 10 MG tablet TAKE 1 TABLET(10 MG) BY MOUTH DAILY 90 tablet 3  . Multiple Vitamins-Minerals (MULTIVITAMIN PO) Take 1 tablet by mouth daily.    . pioglitazone (ACTOS) 30  MG tablet Take 1 tablet (30 mg total) by mouth daily. 90 tablet 1  . prochlorperazine (COMPAZINE) 10 MG tablet Take 1 tablet (10 mg total) by mouth every 6 (six) hours as needed for nausea or vomiting. 30 tablet 0  . rosuvastatin (CRESTOR) 20 MG tablet TAKE 1 TABLET BY MOUTH ONCE DAILY 180 tablet 0  . SitaGLIPtin-MetFORMIN HCl (JANUMET XR) 50-1000 MG TB24 Take 1 tablet by mouth daily. 60 tablet 5  . vitamin B-12 (CYANOCOBALAMIN) 1000 MCG  tablet Take 1,000 mcg by mouth daily.     No current facility-administered medications for this visit.   Facility-Administered Medications Ordered in Other Visits  Medication Dose Route Frequency Provider Last Rate Last Admin  . heparin lock flush 100 unit/mL  500 Units Intracatheter Once PRN Curt Bears, MD      . sodium chloride flush (NS) 0.9 % injection 10 mL  10 mL Intracatheter PRN Curt Bears, MD        SURGICAL HISTORY:  Past Surgical History:  Procedure Laterality Date  . cataract removed Right   . CHOLECYSTECTOMY    . COLONOSCOPY    . ENDARTERECTOMY Left 07/14/2013   Procedure: ENDARTERECTOMY CAROTID-LEFT;  Surgeon: Elam Dutch, MD;  Location: [facility];  Service: Vascular;  Laterality: Left;  . EYE SURGERY Left    cataract  . IR CV LINE INJECTION  05/13/2019  . IR IMAGING GUIDED PORT INSERTION  09/15/2018  . IR PATIENT EVAL TECH 0-60 MINS  05/08/2019  . IR PERC PLEURAL DRAIN W/INDWELL CATH W/IMG GUIDE  05/23/2017  . KNEE SURGERY Left 74yr ago  . LAPAROSCOPIC PARTIAL COLECTOMY N/A 09/02/2015   Procedure: LAPAROSCOPIC PARTIAL RIGHT COLECTOMY;  Surgeon: ALeighton Ruff MD;  Location: [facility];  Service: General;  Laterality: N/A;  . PATCH ANGIOPLASTY Left 07/14/2013   Procedure: LEFT CAROTID ARTERY PATCH ANGIOPLASTY;  Surgeon: CElam Dutch MD;  [facility]  Service: Vascular;  Laterality: Left;  .Marland KitchenVIDEO BRONCHOSCOPY N/A 06/04/2018   Procedure: VIDEO BRONCHOSCOPY;  Surgeon: HMelrose Nakayama MD;  [facility];  Service: Thoracic;  Laterality: N/A;  . wisdom      REVIEW OF SYSTEMS:  A comprehensive review of systems was negative.   PHYSICAL EXAMINATION: General appearance: alert, cooperative and no distress Head: Normocephalic, without obvious abnormality, atraumatic Neck: no adenopathy, no JVD, supple, symmetrical, trachea midline and thyroid not enlarged, symmetric, no tenderness/mass/nodules Lymph nodes: Cervical, supraclavicular, and axillary  nodes normal. Resp: clear to auscultation bilaterally Back: symmetric, no curvature. ROM normal. No CVA tenderness. Cardio: regular rate and rhythm, S1, S2 normal, no murmur, click, rub or gallop GI: soft, non-tender; bowel sounds normal; no masses,  no organomegaly Extremities: extremities normal, atraumatic, no cyanosis or edema  ECOG PERFORMANCE STATUS: 1 - Symptomatic but completely ambulatory  Blood pressure (!) 146/68, pulse 92, temperature 98.7 F (37.1 C), temperature source Temporal, resp. rate 20, height '5\' 6"'  (1.676 m), weight 139 lb 12.8 oz (63.4 kg), SpO2 98 %.  LABORATORY DATA: Lab Results  Component Value Date   WBC 11.4 (H) 08/25/2019   HGB 11.3 (L) 08/25/2019   HCT 35.0 (L) 08/25/2019   MCV 87.7 08/25/2019   PLT 405 (H) 08/25/2019      Chemistry      Component Value Date/Time   NA 140 08/11/2019 1258   NA 139 05/07/2018 1147   K 4.2 08/11/2019 1258   CL 101 08/11/2019 1258   CO2 28 08/11/2019 1258   BUN 13 08/11/2019 1258  BUN 15 05/07/2018 1147   CREATININE 0.92 08/11/2019 1258   CREATININE 0.87 03/08/2015 0001      Component Value Date/Time   CALCIUM 9.8 08/11/2019 1258   ALKPHOS 71 08/11/2019 1258   AST 8 (L) 08/11/2019 1258   ALT <6 08/11/2019 1258   BILITOT 0.4 08/11/2019 1258       RADIOGRAPHIC STUDIES: No results found.  ASSESSMENT AND PLAN: This is a very pleasant 79 years old white female with recurrent non-small cell lung cancer presented as a stage IIIa involving the right lower lobe. The patient underwent a course of concurrent chemoradiation with weekly carboplatin and paclitaxel status post 7 cycles.  She tolerated this treatment well. The patient was started on treatment with consolidation immunotherapy with Imfinzi status post 25 cycles.   She has been tolerating this treatment well with no concerning adverse effects. I recommended for the patient to proceed with cycle #26 today as planned. I will see her back for follow-up visit  in 2 weeks for evaluation with repeat CT scan of the chest for restaging of her disease. For the seizure activity, she is currently followed by neurology and receiving treatment with Depakote twice daily. The patient was advised to call immediately if she has any concerning symptoms in the interval. The patient voices understanding of current disease status and treatment options and is in agreement with the current care plan. All questions were answered. The patient knows to call the clinic with any problems, questions or concerns. We can certainly see the patient much sooner if necessary.  Disclaimer: This note was dictated with voice recognition software. Similar sounding words can inadvertently be transcribed and may not be corrected upon review.

## 2019-08-26 ENCOUNTER — Telehealth: Payer: Self-pay | Admitting: Internal Medicine

## 2019-08-26 NOTE — Telephone Encounter (Signed)
Scheduled per los. Called and spoke with daughter. Confirmed appt.

## 2019-08-27 ENCOUNTER — Other Ambulatory Visit: Payer: Self-pay | Admitting: Neurology

## 2019-09-07 ENCOUNTER — Inpatient Hospital Stay: Payer: Medicare Other

## 2019-09-07 ENCOUNTER — Ambulatory Visit (HOSPITAL_COMMUNITY)
Admission: RE | Admit: 2019-09-07 | Discharge: 2019-09-07 | Disposition: A | Discharge status: Home / Self Care | Payer: Medicare Other | Source: Ambulatory Visit | Attending: Internal Medicine | Admitting: Internal Medicine

## 2019-09-07 ENCOUNTER — Other Ambulatory Visit: Payer: Self-pay

## 2019-09-07 DIAGNOSIS — I10 Essential (primary) hypertension: Secondary | ICD-10-CM | POA: Diagnosis not present

## 2019-09-07 DIAGNOSIS — E1140 Type 2 diabetes mellitus with diabetic neuropathy, unspecified: Secondary | ICD-10-CM | POA: Diagnosis not present

## 2019-09-07 DIAGNOSIS — Z7982 Long term (current) use of aspirin: Secondary | ICD-10-CM | POA: Diagnosis not present

## 2019-09-07 DIAGNOSIS — C3490 Malignant neoplasm of unspecified part of unspecified bronchus or lung: Secondary | ICD-10-CM

## 2019-09-07 DIAGNOSIS — Z923 Personal history of irradiation: Secondary | ICD-10-CM | POA: Diagnosis not present

## 2019-09-07 DIAGNOSIS — R569 Unspecified convulsions: Secondary | ICD-10-CM | POA: Diagnosis not present

## 2019-09-07 DIAGNOSIS — Z7984 Long term (current) use of oral hypoglycemic drugs: Secondary | ICD-10-CM | POA: Diagnosis not present

## 2019-09-07 DIAGNOSIS — Z79899 Other long term (current) drug therapy: Secondary | ICD-10-CM | POA: Diagnosis not present

## 2019-09-07 DIAGNOSIS — Z5112 Encounter for antineoplastic immunotherapy: Secondary | ICD-10-CM | POA: Diagnosis not present

## 2019-09-07 DIAGNOSIS — Z95828 Presence of other vascular implants and grafts: Secondary | ICD-10-CM

## 2019-09-07 DIAGNOSIS — Z8673 Personal history of transient ischemic attack (TIA), and cerebral infarction without residual deficits: Secondary | ICD-10-CM | POA: Diagnosis not present

## 2019-09-07 DIAGNOSIS — C3411 Malignant neoplasm of upper lobe, right bronchus or lung: Secondary | ICD-10-CM | POA: Diagnosis not present

## 2019-09-07 LAB — CBC WITH DIFFERENTIAL (CANCER CENTER ONLY)
Abs Immature Granulocytes: 0.06 10*3/uL (ref 0.00–0.07)
Basophils Absolute: 0.1 10*3/uL (ref 0.0–0.1)
Basophils Relative: 0 %
Eosinophils Absolute: 0.4 10*3/uL (ref 0.0–0.5)
Eosinophils Relative: 3 %
HCT: 37.6 % (ref 36.0–46.0)
Hemoglobin: 11.7 g/dL — ABNORMAL LOW (ref 12.0–15.0)
Immature Granulocytes: 1 %
Lymphocytes Relative: 14 %
Lymphs Abs: 1.7 10*3/uL (ref 0.7–4.0)
MCH: 27.5 pg (ref 26.0–34.0)
MCHC: 31.1 g/dL (ref 30.0–36.0)
MCV: 88.3 fL (ref 80.0–100.0)
Monocytes Absolute: 0.9 10*3/uL (ref 0.1–1.0)
Monocytes Relative: 8 %
Neutro Abs: 9 10*3/uL — ABNORMAL HIGH (ref 1.7–7.7)
Neutrophils Relative %: 74 %
Platelet Count: 513 10*3/uL — ABNORMAL HIGH (ref 150–400)
RBC: 4.26 MIL/uL (ref 3.87–5.11)
RDW: 15 % (ref 11.5–15.5)
WBC Count: 12.1 10*3/uL — ABNORMAL HIGH (ref 4.0–10.5)
nRBC: 0 % (ref 0.0–0.2)

## 2019-09-07 LAB — CMP (CANCER CENTER ONLY)
ALT: 9 U/L (ref 0–44)
AST: 10 U/L — ABNORMAL LOW (ref 15–41)
Albumin: 3.2 g/dL — ABNORMAL LOW (ref 3.5–5.0)
Alkaline Phosphatase: 70 U/L (ref 38–126)
Anion gap: 13 (ref 5–15)
BUN: 14 mg/dL (ref 8–23)
CO2: 29 mmol/L (ref 22–32)
Calcium: 10.1 mg/dL (ref 8.9–10.3)
Chloride: 103 mmol/L (ref 98–111)
Creatinine: 0.9 mg/dL (ref 0.44–1.00)
GFR, Est AFR Am: >60 mL/min (ref >60)
GFR, Estimated: >60 mL/min (ref >60)
Glucose, Bld: 147 mg/dL — ABNORMAL HIGH (ref 70–99)
Potassium: 4.6 mmol/L (ref 3.5–5.1)
Sodium: 145 mmol/L (ref 135–145)
Total Bilirubin: 0.3 mg/dL (ref 0.3–1.2)
Total Protein: 7.2 g/dL (ref 6.5–8.1)

## 2019-09-07 LAB — TSH: TSH: 2.173 u[IU]/mL (ref 0.308–3.960)

## 2019-09-07 MED ORDER — IOHEXOL 300 MG/ML  SOLN
75.0000 mL | Freq: Once | INTRAMUSCULAR | Status: AC | PRN
  Administered 2019-09-07: 75 mL via INTRAVENOUS

## 2019-09-07 MED ORDER — SODIUM CHLORIDE 0.9% FLUSH
10.0000 mL | INTRAVENOUS | Status: DC | PRN
  Administered 2019-09-07: 12:00:00 10 mL
  Filled 2019-09-07: qty 10

## 2019-09-07 MED ORDER — HEPARIN SOD (PORK) LOCK FLUSH 100 UNIT/ML IV SOLN
500.0000 [IU] | Freq: Once | INTRAVENOUS | Status: DC | PRN
  Filled 2019-09-07: qty 5

## 2019-09-07 MED ORDER — SODIUM CHLORIDE (PF) 0.9 % IJ SOLN
INTRAMUSCULAR | Status: AC
  Filled 2019-09-07: qty 50

## 2019-09-07 NOTE — Progress Notes (Signed)
Unable to get blood return.  Patient ok to go back to lab for peripheral.  No treatment scheduled today.

## 2019-09-07 NOTE — Patient Instructions (Signed)

## 2019-09-08 ENCOUNTER — Encounter: Payer: Self-pay | Admitting: Internal Medicine

## 2019-09-08 ENCOUNTER — Other Ambulatory Visit (INDEPENDENT_AMBULATORY_CARE_PROVIDER_SITE_OTHER): Payer: Medicare Other

## 2019-09-08 ENCOUNTER — Other Ambulatory Visit: Payer: Self-pay

## 2019-09-08 ENCOUNTER — Inpatient Hospital Stay (HOSPITAL_BASED_OUTPATIENT_CLINIC_OR_DEPARTMENT_OTHER): Payer: Medicare Other | Admitting: Internal Medicine

## 2019-09-08 DIAGNOSIS — E538 Deficiency of other specified B group vitamins: Secondary | ICD-10-CM | POA: Diagnosis not present

## 2019-09-08 DIAGNOSIS — Z923 Personal history of irradiation: Secondary | ICD-10-CM | POA: Diagnosis not present

## 2019-09-08 DIAGNOSIS — I10 Essential (primary) hypertension: Secondary | ICD-10-CM | POA: Diagnosis not present

## 2019-09-08 DIAGNOSIS — C3490 Malignant neoplasm of unspecified part of unspecified bronchus or lung: Secondary | ICD-10-CM | POA: Diagnosis not present

## 2019-09-08 DIAGNOSIS — Z7984 Long term (current) use of oral hypoglycemic drugs: Secondary | ICD-10-CM | POA: Diagnosis not present

## 2019-09-08 DIAGNOSIS — R569 Unspecified convulsions: Secondary | ICD-10-CM | POA: Diagnosis not present

## 2019-09-08 DIAGNOSIS — C3411 Malignant neoplasm of upper lobe, right bronchus or lung: Secondary | ICD-10-CM | POA: Diagnosis not present

## 2019-09-08 DIAGNOSIS — Z7982 Long term (current) use of aspirin: Secondary | ICD-10-CM | POA: Diagnosis not present

## 2019-09-08 DIAGNOSIS — Z79899 Other long term (current) drug therapy: Secondary | ICD-10-CM | POA: Diagnosis not present

## 2019-09-08 DIAGNOSIS — Z5112 Encounter for antineoplastic immunotherapy: Secondary | ICD-10-CM | POA: Diagnosis not present

## 2019-09-08 DIAGNOSIS — Z8673 Personal history of transient ischemic attack (TIA), and cerebral infarction without residual deficits: Secondary | ICD-10-CM | POA: Diagnosis not present

## 2019-09-08 DIAGNOSIS — E1140 Type 2 diabetes mellitus with diabetic neuropathy, unspecified: Secondary | ICD-10-CM | POA: Diagnosis not present

## 2019-09-08 MED ORDER — CYANOCOBALAMIN 1000 MCG/ML IJ SOLN
1000.0000 ug | Freq: Once | INTRAMUSCULAR | Status: AC
  Administered 2019-09-08: 15:00:00 1000 ug via INTRAMUSCULAR

## 2019-09-08 NOTE — Progress Notes (Signed)
[name] [phone]   [phone]  OFFICE PROGRESS NOTE  [name], MD [place] [address]  DIAGNOSIS:Recurrent non-small cell lung cancer likely squamous cell carcinoma that was initially diagnosed as a stage IIA(T2b, N0, M0) inAugust 2018status post curative stereotactic radiotherapy completed June 21, 2017. The patient presented today with concerning findings for disease recurrence and progression with enlarging and hypermetabolic right upper lobe lung mass presenting as stage IIIa (T3, N0, M0).  PDL 1 expression 0%.  PRIOR THERAPY: 1) Curative stereotactic radiotherapy completed on June 21, 2017. 2) A course of concurrent chemoradiation with chemotherapy consisting of carboplatin for an AUC of 2 and paclitaxel 45 mg/m.  First dose started on 06/23/2018.  Status post 7 cycles.  Last dose of chemotherapy was given August 04, 2018. 3) Consolidation immunotherapy with Imfinzi 10 mg/KG every 2 weeks.  First dose September 09, 2018.  Status post 26 cycles.   CURRENT THERAPY:None.  INTERVAL HISTORY: [name] 79 y.o. female returns to the clinic today for follow-up visit.  The patient is feeling fine today with no concerning complaints except for shortness of breath with exertion.  She is feeling much better after she has a right-sided ultrasound-guided thoracentesis with drainage of right pleural effusion.  The patient denied having any current chest pain, cough or hemoptysis.  She denied having any fever or chills.  She has no nausea, vomiting, diarrhea or constipation.  She denied having any headache or visual changes.  She has no weight loss or night sweats.  She completed a course of consolidation treatment with Imfinzi status post 26 cycles.  The patient had repeat CT scan of the chest performed recently and she is here today for evaluation and discussion of her risk her results.  MEDICAL  HISTORY: Past Medical History:  Diagnosis Date  . Cancer ([place])    Lung   . Carotid artery occlusion   . Clostridium difficile infection 06/2013  . Diabetes mellitus    takes Janumet daily  . Dyslipidemia    takes Crestor daily  . Gallstones   . GERD (gastroesophageal reflux disease)   . Heart murmur    hx of  . History of radiation therapy 06/11/17-06/21/17   right lung 50 Gy in 5 fractions  . Hypertension    takes Prinzide and Verapamil daily  . Impaired speech    from stroke  . Neuropathy, diabetic ([place])   . Pneumonia   . PONV (postoperative nausea and vomiting)   . Seizures ([place]) 04/19/2018   recent hospitalization  . Smoker   . Stroke ([place]) 06/25/13  . Vertigo    but doesn't take any meds    ALLERGIES:  is allergic to codeine; benadryl [diphenhydramine]; lipitor [atorvastatin]; and phenergan [promethazine hcl].  MEDICATIONS:  Current Outpatient Medications  Medication Sig Dispense Refill  . aspirin EC 81 MG tablet Take 81 mg by mouth daily.    . Blood Glucose Monitoring Suppl (ONE TOUCH ULTRA 2) w/Device KIT 1 Device by Does not apply route 2 (two) times daily. 1 each 0  . divalproex (DEPAKOTE ER) 500 MG 24 hr tablet Take one tablet  twice daily 60 tablet 6  . glucose blood (ONE TOUCH ULTRA TEST) test strip Use as instructed 100 each 12  . Lancets (ONETOUCH ULTRASOFT) lancets Use as instructed 100 each 12  . lidocaine-prilocaine (EMLA) cream APPLY TO THE AFFECTED AREA DAILY AS NEEDED (Patient taking differently: Apply 1 application topically  daily as needed (Pain). ) 30 g 0  . lisinopril (ZESTRIL) 10 MG tablet TAKE 1 TABLET(10 MG) BY MOUTH DAILY 90 tablet 3  . Multiple Vitamins-Minerals (MULTIVITAMIN PO) Take 1 tablet by mouth daily.    . pioglitazone (ACTOS) 30 MG tablet Take 1 tablet (30 mg total) by mouth daily. 90 tablet 1  . prochlorperazine (COMPAZINE) 10 MG tablet Take 1 tablet (10 mg total) by mouth every 6 (six) hours as needed for nausea or vomiting.  (Patient not taking: Reported on 08/25/2019) 30 tablet 0  . rosuvastatin (CRESTOR) 20 MG tablet TAKE 1 TABLET BY MOUTH ONCE DAILY 180 tablet 0  . SitaGLIPtin-MetFORMIN HCl (JANUMET XR) 50-1000 MG TB24 Take 1 tablet by mouth daily. 60 tablet 5  . vitamin B-12 (CYANOCOBALAMIN) 1000 MCG tablet Take 1,000 mcg by mouth daily.     No current facility-administered medications for this visit.   Facility-Administered Medications Ordered in Other Visits  Medication Dose Route Frequency Provider Last Rate Last Admin  . heparin lock flush 100 unit/mL  500 Units Intracatheter Once PRN Curt Bears, MD      . sodium chloride flush (NS) 0.9 % injection 10 mL  10 mL Intracatheter PRN Curt Bears, MD        SURGICAL HISTORY:  Past Surgical History:  Procedure Laterality Date  . cataract removed Right   . CHOLECYSTECTOMY    . COLONOSCOPY    . ENDARTERECTOMY Left 07/14/2013   Procedure: ENDARTERECTOMY CAROTID-LEFT;  Surgeon: Elam Dutch, MD;  Location: [facility];  Service: Vascular;  Laterality: Left;  . EYE SURGERY Left    cataract  . IR CV LINE INJECTION  05/13/2019  . IR IMAGING GUIDED PORT INSERTION  09/15/2018  . IR PATIENT EVAL TECH 0-60 MINS  05/08/2019  . IR PERC PLEURAL DRAIN W/INDWELL CATH W/IMG GUIDE  05/23/2017  . KNEE SURGERY Left 26yr ago  . LAPAROSCOPIC PARTIAL COLECTOMY N/A 09/02/2015   Procedure: LAPAROSCOPIC PARTIAL RIGHT COLECTOMY;  Surgeon: ALeighton Ruff MD;  Location: [facility];  Service: General;  Laterality: N/A;  . PATCH ANGIOPLASTY Left 07/14/2013   Procedure: LEFT CAROTID ARTERY PATCH ANGIOPLASTY;  Surgeon: CElam Dutch MD;  [facility] [facility]  Service: Vascular;  Laterality: Left;  .Marland KitchenVIDEO BRONCHOSCOPY N/A 06/04/2018   Procedure: VIDEO BRONCHOSCOPY;  Surgeon: HMelrose Nakayama MD;  [facility];  Service: Thoracic;  Laterality: N/A;  . wisdom      REVIEW OF SYSTEMS:  Constitutional: negative Eyes: negative Ears, nose, mouth, throat, and face:  negative Respiratory: positive for dyspnea on exertion Cardiovascular: negative Gastrointestinal: negative Genitourinary:negative Integument/breast: negative Hematologic/lymphatic: negative Musculoskeletal:negative Neurological: negative Behavioral/Psych: negative Endocrine: negative Allergic/Immunologic: negative   PHYSICAL EXAMINATION: General appearance: alert, cooperative and no distress Head: Normocephalic, without obvious abnormality, atraumatic Neck: no adenopathy, no JVD, supple, symmetrical, trachea midline and thyroid not enlarged, symmetric, no tenderness/mass/nodules Lymph nodes: Cervical, supraclavicular, and axillary nodes normal. Resp: clear to auscultation bilaterally Back: symmetric, no curvature. ROM normal. No CVA tenderness. Cardio: regular rate and rhythm, S1, S2 normal, no murmur, click, rub or gallop GI: soft, non-tender; bowel sounds normal; no masses,  no organomegaly Extremities: extremities normal, atraumatic, no cyanosis or edema Neurologic: Alert and oriented X 3, normal strength and tone. Normal symmetric reflexes. Normal coordination and gait  ECOG PERFORMANCE STATUS: 1 - Symptomatic but completely ambulatory  Blood pressure 134/66, pulse 92, temperature 99.1 F (37.3 C), temperature source Temporal, resp. rate 18, height 5' 6" (1.676 m), weight 139 lb 8 oz (  63.3 kg), SpO2 96 %.  LABORATORY DATA: Lab Results  Component Value Date   WBC 12.1 (H) 09/07/2019   HGB 11.7 (L) 09/07/2019   HCT 37.6 09/07/2019   MCV 88.3 09/07/2019   PLT 513 (H) 09/07/2019      Chemistry      Component Value Date/Time   NA 145 09/07/2019 1237   NA 139 05/07/2018 1147   K 4.6 09/07/2019 1237   CL 103 09/07/2019 1237   CO2 29 09/07/2019 1237   BUN 14 09/07/2019 1237   BUN 15 05/07/2018 1147   CREATININE 0.90 09/07/2019 1237   CREATININE 0.87 03/08/2015 0001      Component Value Date/Time   CALCIUM 10.1 09/07/2019 1237   ALKPHOS 70 09/07/2019 1237   AST 10  (L) 09/07/2019 1237   ALT 9 09/07/2019 1237   BILITOT 0.3 09/07/2019 1237       RADIOGRAPHIC STUDIES: CT Chest W Contrast  Result Date: 09/07/2019 CLINICAL DATA:  Non-small-cell lung cancer. EXAM: CT CHEST WITH CONTRAST TECHNIQUE: Multidetector CT imaging of the chest was performed during intravenous contrast administration. CONTRAST:  57m OMNIPAQUE IOHEXOL 300 MG/ML  SOLN COMPARISON:  CTA chest 04/13/2019. FINDINGS: Cardiovascular: The heart size is normal. No substantial pericardial effusion. Coronary artery calcification is evident. Atherosclerotic calcification is noted in the wall of the thoracic aorta. Right Port-A-Cath tip is positioned in the mid SVC. Mediastinum/Nodes: No mediastinal lymphadenopathy. No left hilar lymphadenopathy. Similar appearance of abnormal soft tissue in the right suprahilar region. The esophagus has normal imaging features. There is no axillary lymphadenopathy. Lungs/Pleura: Continued progression of the right suprahilar lung mass measuring 4.2 x 7.0 x 4.8 cm today compared to 5.1 x 5.5 x 3.7 cm. This is associated with interval progression of right upper lobe collapse with substantial mass-effect on the right upper lobe airway. The right pleural effusion is smaller on today's study but has new loculated components anteriorly. There is a focus of consolidative opacity in the posterior right lower lobe, likely atelectatic. 12 mm left lower lobe nodule in the paraspinal left lower lobe (53/7) is similar in size but is more confluent on today's study. There is a new sub solid left parahilar nodule measuring 14 mm on image 57/7. 5 mm posterior left upper lobe subpleural nodule on 26/7 is stable. No left pleural effusion. Upper Abdomen: Similar appearance of probable cysts in the upper left kidney. Musculoskeletal: No worrisome lytic or sclerotic osseous abnormality. Chronic changes with nonacute fracture in the anterior right second rib are similar to prior. IMPRESSION: 1.  Continued progression of the right suprahilar lung mass with interval worsening of right upper lobe collapse. 2. Slight decrease in right pleural effusion, now moderate in size but demonstrating new loculated components anteriorly in the right hemithorax. 3. Interval development of a 14 mm sub solid left parahilar nodule. Close attention on follow-up recommended. An adjacent 12 mm left lower lobe pulmonary nodule is of similar size today but more confluent. 4. Aortic Atherosclerosis (ICD10-I70.0). Electronically Signed   By: EMisty StanleyM.D.   On: 09/07/2019 16:17    ASSESSMENT AND PLAN: This is a very pleasant [age]years old white female with recurrent non-small cell lung cancer presented as a stage IIIa involving the right lower lobe. The patient underwent a course of concurrent chemoradiation with weekly carboplatin and paclitaxel status post 7 cycles.  She tolerated this treatment well. The patient completed a course of consolidation immunotherapy with Imfinzi status post 26 cycles.  She tolerated her  treatment well with no concerning complaints. She had repeat CT scan of the chest performed recently.  I personally and independently reviewed the scan images and discussed the results with the patient and her daughter who was available by phone. Her scan showed continuous progression of the collapse of the right upper lobe with progression of right suprahilar mass.  There was slight decrease in the right pleural effusion after the thoracentesis. I recommended for the patient to continue on observation after completion of the consolidation immunotherapy.  I will see her back for follow-up visit in 2 months for evaluation with repeat CT scan of the chest for restaging of her disease.  If she continues to have progressive disease in the right suprahilar area or other areas of concern, I may consider the patient for repeat PET scan or repeat biopsy for confirmation of the disease progression. For the seizure  activity she is currently on Depakote and followed by neurology. The patient was advised to call immediately if she has any concerning symptoms in the interval. The patient voices understanding of current disease status and treatment options and is in agreement with the current care plan. All questions were answered. The patient knows to call the clinic with any problems, questions or concerns. We can certainly see the patient much sooner if necessary.  Disclaimer: This note was dictated with voice recognition software. Similar sounding words can inadvertently be transcribed and may not be corrected upon review.

## 2019-09-09 ENCOUNTER — Telehealth: Payer: Self-pay | Admitting: Internal Medicine

## 2019-09-09 NOTE — Telephone Encounter (Signed)
Scheduled per los. Called and left msg. Mailed printout  °

## 2019-10-12 ENCOUNTER — Other Ambulatory Visit: Payer: Self-pay

## 2019-10-12 ENCOUNTER — Other Ambulatory Visit (INDEPENDENT_AMBULATORY_CARE_PROVIDER_SITE_OTHER): Payer: Medicare Other

## 2019-10-12 DIAGNOSIS — E538 Deficiency of other specified B group vitamins: Secondary | ICD-10-CM

## 2019-10-12 MED ORDER — CYANOCOBALAMIN 1000 MCG/ML IJ SOLN
1000.0000 ug | Freq: Once | INTRAMUSCULAR | Status: AC
  Administered 2019-10-12: 1000 ug via INTRAMUSCULAR

## 2019-10-29 ENCOUNTER — Encounter: Payer: Self-pay | Admitting: Family Medicine

## 2019-10-29 ENCOUNTER — Ambulatory Visit (INDEPENDENT_AMBULATORY_CARE_PROVIDER_SITE_OTHER): Payer: Medicare Other | Admitting: Family Medicine

## 2019-10-29 ENCOUNTER — Ambulatory Visit
Admission: RE | Admit: 2019-10-29 | Discharge: 2019-10-29 | Disposition: A | Discharge status: Home / Self Care | Payer: Medicare Other | Source: Ambulatory Visit | Attending: Family Medicine | Admitting: Family Medicine

## 2019-10-29 ENCOUNTER — Other Ambulatory Visit: Payer: Self-pay

## 2019-10-29 VITALS — BP 152/82 | HR 92 | Temp 97.8°F | Wt 134.2 lb

## 2019-10-29 DIAGNOSIS — C3491 Malignant neoplasm of unspecified part of right bronchus or lung: Secondary | ICD-10-CM

## 2019-10-29 DIAGNOSIS — R569 Unspecified convulsions: Secondary | ICD-10-CM | POA: Diagnosis not present

## 2019-10-29 DIAGNOSIS — E1169 Type 2 diabetes mellitus with other specified complication: Secondary | ICD-10-CM

## 2019-10-29 DIAGNOSIS — I10 Essential (primary) hypertension: Secondary | ICD-10-CM

## 2019-10-29 DIAGNOSIS — E785 Hyperlipidemia, unspecified: Secondary | ICD-10-CM

## 2019-10-29 DIAGNOSIS — M25511 Pain in right shoulder: Secondary | ICD-10-CM

## 2019-10-29 DIAGNOSIS — E1159 Type 2 diabetes mellitus with other circulatory complications: Secondary | ICD-10-CM

## 2019-10-29 LAB — POCT UA - MICROALBUMIN
Albumin/Creatinine Ratio, Urine, POC: 8.7
Creatinine, POC: 90.5 mg/dL
Microalbumin Ur, POC: 7.9 mg/L

## 2019-10-29 LAB — POCT GLYCOSYLATED HEMOGLOBIN (HGB A1C): Hemoglobin A1C: 7.7 % — AB (ref 4.0–5.6)

## 2019-10-29 NOTE — Progress Notes (Signed)
Subjective:    Patient ID: [name], female    DOB: [birth year], 79 y.o.   MRN: [id number]  [name] is a 79 y.o. female who presents for follow-up of Type 2 diabetes mellitus.  Home blood sugar records: meter record , checking fasting and 2 hours post meal  170 avg Current symptoms/problems include the blood sugar readings. Daily foot checks: yes   Any foot concerns: none at this time Exercise: not much  Diet: not eating much She complains of a 63-month history of right shoulder pain with limitation of motion interfering with her ADLs.  No history of injury. She continues on Crestor for her lipids and is having no difficulty with that.  She is also taking lisinopril without cough or swelling.  She is taking Janumet as well as Actos.  She is on a B12 supplement. The following portions of the patient's history were reviewed and updated as appropriate: allergies, current medications, past medical history, past social history and problem list.  ROS as in subjective above.     Objective:    Physical Exam Alert and in no distress exam of the right shoulder does show limitation of motion in all direction with pain at extremes of motion.  Negative sulcus sign.  No laxity noted.  Drop arm test difficult to do due to limited range of motion. Hemoglobin A1c is 7.7   Lab Review Diabetic Labs Latest Ref Rng & Units 09/07/2019 08/25/2019 08/11/2019 07/28/2019 07/14/2019  HbA1c 4.0 - 5.6 % - - - - -  Microalbumin mg/L - - - - -  Micro/Creat Ratio - - - - - -  Chol 0 - 200 mg/dL - - - - -  HDL >40 mg/dL - - - - -  Calc LDL 0 - 99 mg/dL - - - - -  Triglycerides <150 mg/dL - - - - -  Creatinine 0.44 - 1.00 mg/dL 0.90 0.93 0.92 0.95 0.84   BP/Weight 09/08/2019 08/25/2019 08/11/2019 07/30/2019 8/67/6720  Systolic BP 947 096 283 662 947  Diastolic BP 66 68 68 71 78  Wt. (Lbs) 139.5 139.8 139.3 138 139.5  BMI 22.52 22.56 22.48 22.27 22.52   Foot/eye exam completion dates Latest Ref Rng & Units  03/08/2015 04/15/2014  Eye Exam No Retinopathy - No Retinopathy  Foot Form Completion - Done -    [name]  reports that she has been smoking cigarettes. She has a 30.00 pack-year smoking history. She has never used smokeless tobacco. She reports that she does not drink alcohol or use drugs.     Assessment & Plan:    DM type 2 with diabetic dyslipidemia ([place]) - Plan: POCT UA - Microalbumin, POCT glycosylated hemoglobin (Hb A1C)  Acute pain of right shoulder - Plan: DG Shoulder Right  Non-small cell cancer of right lung ([facility])  Partial seizure ([place])  Hypertension associated with diabetes ([place])  Hyperlipidemia associated with type 2 diabetes mellitus ([place])   1. Rx changes: none 2. Education: Reviewed 'ABCs' of diabetes management (respective goals in parentheses):  A1C (<7), blood pressure (<130/80), and cholesterol (LDL <100). 3. Compliance at present is estimated to be fair. Efforts to improve compliance (if necessary) will be directed at increased exercise.  Discussed using her walker and walking in the driveway for couple of minutes at a time but slowly increasing this to build up her strength and stamina.  She is not exercising because she is worried about falling so hopefully the walker will help with that. 4.  I then discussed the right shoulder pain and will get the x-ray first to determine exactly what the next step will be.  She was comfortable with that. 5. Follow up: 4 months

## 2019-10-30 ENCOUNTER — Other Ambulatory Visit: Payer: Self-pay | Admitting: Family Medicine

## 2019-10-30 ENCOUNTER — Other Ambulatory Visit: Payer: Self-pay

## 2019-10-30 DIAGNOSIS — E1169 Type 2 diabetes mellitus with other specified complication: Secondary | ICD-10-CM

## 2019-10-30 DIAGNOSIS — M25511 Pain in right shoulder: Secondary | ICD-10-CM

## 2019-10-30 DIAGNOSIS — E785 Hyperlipidemia, unspecified: Secondary | ICD-10-CM

## 2019-11-02 ENCOUNTER — Other Ambulatory Visit: Payer: Self-pay

## 2019-11-02 ENCOUNTER — Encounter (HOSPITAL_COMMUNITY): Payer: Self-pay

## 2019-11-02 ENCOUNTER — Inpatient Hospital Stay: Payer: Medicare Other | Attending: Internal Medicine

## 2019-11-02 ENCOUNTER — Ambulatory Visit (HOSPITAL_COMMUNITY)
Admission: RE | Admit: 2019-11-02 | Discharge: 2019-11-02 | Disposition: A | Discharge status: Home / Self Care | Payer: Medicare Other | Source: Ambulatory Visit | Attending: Internal Medicine | Admitting: Internal Medicine

## 2019-11-02 DIAGNOSIS — Z9221 Personal history of antineoplastic chemotherapy: Secondary | ICD-10-CM | POA: Diagnosis not present

## 2019-11-02 DIAGNOSIS — E119 Type 2 diabetes mellitus without complications: Secondary | ICD-10-CM | POA: Insufficient documentation

## 2019-11-02 DIAGNOSIS — C3411 Malignant neoplasm of upper lobe, right bronchus or lung: Secondary | ICD-10-CM | POA: Diagnosis not present

## 2019-11-02 DIAGNOSIS — I10 Essential (primary) hypertension: Secondary | ICD-10-CM | POA: Insufficient documentation

## 2019-11-02 DIAGNOSIS — C3490 Malignant neoplasm of unspecified part of unspecified bronchus or lung: Secondary | ICD-10-CM | POA: Diagnosis not present

## 2019-11-02 DIAGNOSIS — Z923 Personal history of irradiation: Secondary | ICD-10-CM | POA: Diagnosis not present

## 2019-11-02 DIAGNOSIS — E785 Hyperlipidemia, unspecified: Secondary | ICD-10-CM | POA: Diagnosis not present

## 2019-11-02 DIAGNOSIS — Z9049 Acquired absence of other specified parts of digestive tract: Secondary | ICD-10-CM | POA: Diagnosis not present

## 2019-11-02 DIAGNOSIS — Z7982 Long term (current) use of aspirin: Secondary | ICD-10-CM | POA: Insufficient documentation

## 2019-11-02 DIAGNOSIS — Z8673 Personal history of transient ischemic attack (TIA), and cerebral infarction without residual deficits: Secondary | ICD-10-CM | POA: Insufficient documentation

## 2019-11-02 DIAGNOSIS — Z7984 Long term (current) use of oral hypoglycemic drugs: Secondary | ICD-10-CM | POA: Insufficient documentation

## 2019-11-02 DIAGNOSIS — Z87891 Personal history of nicotine dependence: Secondary | ICD-10-CM | POA: Diagnosis not present

## 2019-11-02 DIAGNOSIS — Z79899 Other long term (current) drug therapy: Secondary | ICD-10-CM | POA: Diagnosis not present

## 2019-11-02 LAB — CBC WITH DIFFERENTIAL (CANCER CENTER ONLY)
Abs Immature Granulocytes: 0.03 10*3/uL (ref 0.00–0.07)
Basophils Absolute: 0 10*3/uL (ref 0.0–0.1)
Basophils Relative: 0 %
Eosinophils Absolute: 0.4 10*3/uL (ref 0.0–0.5)
Eosinophils Relative: 3 %
HCT: 35.3 % — ABNORMAL LOW (ref 36.0–46.0)
Hemoglobin: 11.1 g/dL — ABNORMAL LOW (ref 12.0–15.0)
Immature Granulocytes: 0 %
Lymphocytes Relative: 10 %
Lymphs Abs: 1.3 10*3/uL (ref 0.7–4.0)
MCH: 27.6 pg (ref 26.0–34.0)
MCHC: 31.4 g/dL (ref 30.0–36.0)
MCV: 87.8 fL (ref 80.0–100.0)
Monocytes Absolute: 0.8 10*3/uL (ref 0.1–1.0)
Monocytes Relative: 6 %
Neutro Abs: 10.6 10*3/uL — ABNORMAL HIGH (ref 1.7–7.7)
Neutrophils Relative %: 81 %
Platelet Count: 562 10*3/uL — ABNORMAL HIGH (ref 150–400)
RBC: 4.02 MIL/uL (ref 3.87–5.11)
RDW: 15.8 % — ABNORMAL HIGH (ref 11.5–15.5)
WBC Count: 13 10*3/uL — ABNORMAL HIGH (ref 4.0–10.5)
nRBC: 0 % (ref 0.0–0.2)

## 2019-11-02 LAB — CMP (CANCER CENTER ONLY)
ALT: 9 U/L (ref 0–44)
AST: 12 U/L — ABNORMAL LOW (ref 15–41)
Albumin: 2.8 g/dL — ABNORMAL LOW (ref 3.5–5.0)
Alkaline Phosphatase: 77 U/L (ref 38–126)
Anion gap: 8 (ref 5–15)
BUN: 14 mg/dL (ref 8–23)
CO2: 27 mmol/L (ref 22–32)
Calcium: 9.8 mg/dL (ref 8.9–10.3)
Chloride: 103 mmol/L (ref 98–111)
Creatinine: 0.91 mg/dL (ref 0.44–1.00)
GFR, Est AFR Am: >60 mL/min (ref >60)
GFR, Estimated: >60 mL/min (ref >60)
Glucose, Bld: 159 mg/dL — ABNORMAL HIGH (ref 70–99)
Potassium: 4.1 mmol/L (ref 3.5–5.1)
Sodium: 138 mmol/L (ref 135–145)
Total Bilirubin: 0.4 mg/dL (ref 0.3–1.2)
Total Protein: 7 g/dL (ref 6.5–8.1)

## 2019-11-02 MED ORDER — SODIUM CHLORIDE (PF) 0.9 % IJ SOLN
INTRAMUSCULAR | Status: AC
  Filled 2019-11-02: qty 50

## 2019-11-02 MED ORDER — IOHEXOL 300 MG/ML  SOLN
75.0000 mL | Freq: Once | INTRAMUSCULAR | Status: AC | PRN
  Administered 2019-11-02: 12:00:00 75 mL via INTRAVENOUS

## 2019-11-04 ENCOUNTER — Inpatient Hospital Stay: Payer: Medicare Other | Admitting: Internal Medicine

## 2019-11-04 ENCOUNTER — Other Ambulatory Visit: Payer: Medicare Other

## 2019-11-04 ENCOUNTER — Telehealth: Payer: Self-pay | Admitting: *Deleted

## 2019-11-04 ENCOUNTER — Other Ambulatory Visit: Payer: Self-pay

## 2019-11-04 ENCOUNTER — Other Ambulatory Visit (INDEPENDENT_AMBULATORY_CARE_PROVIDER_SITE_OTHER): Payer: Medicare Other

## 2019-11-04 ENCOUNTER — Encounter: Payer: Self-pay | Admitting: Internal Medicine

## 2019-11-04 VITALS — BP 158/65 | HR 91 | Temp 98.7°F | Resp 18 | Ht 66.0 in | Wt 135.1 lb

## 2019-11-04 DIAGNOSIS — E119 Type 2 diabetes mellitus without complications: Secondary | ICD-10-CM | POA: Diagnosis not present

## 2019-11-04 DIAGNOSIS — Z8673 Personal history of transient ischemic attack (TIA), and cerebral infarction without residual deficits: Secondary | ICD-10-CM | POA: Diagnosis not present

## 2019-11-04 DIAGNOSIS — E538 Deficiency of other specified B group vitamins: Secondary | ICD-10-CM

## 2019-11-04 DIAGNOSIS — C3411 Malignant neoplasm of upper lobe, right bronchus or lung: Secondary | ICD-10-CM | POA: Diagnosis not present

## 2019-11-04 DIAGNOSIS — C3491 Malignant neoplasm of unspecified part of right bronchus or lung: Secondary | ICD-10-CM | POA: Diagnosis not present

## 2019-11-04 DIAGNOSIS — Z9049 Acquired absence of other specified parts of digestive tract: Secondary | ICD-10-CM | POA: Diagnosis not present

## 2019-11-04 DIAGNOSIS — Z923 Personal history of irradiation: Secondary | ICD-10-CM | POA: Diagnosis not present

## 2019-11-04 DIAGNOSIS — I10 Essential (primary) hypertension: Secondary | ICD-10-CM | POA: Diagnosis not present

## 2019-11-04 DIAGNOSIS — Z9221 Personal history of antineoplastic chemotherapy: Secondary | ICD-10-CM | POA: Diagnosis not present

## 2019-11-04 DIAGNOSIS — Z87891 Personal history of nicotine dependence: Secondary | ICD-10-CM | POA: Diagnosis not present

## 2019-11-04 DIAGNOSIS — C3490 Malignant neoplasm of unspecified part of unspecified bronchus or lung: Secondary | ICD-10-CM | POA: Diagnosis not present

## 2019-11-04 DIAGNOSIS — E785 Hyperlipidemia, unspecified: Secondary | ICD-10-CM | POA: Diagnosis not present

## 2019-11-04 DIAGNOSIS — Z7982 Long term (current) use of aspirin: Secondary | ICD-10-CM | POA: Diagnosis not present

## 2019-11-04 DIAGNOSIS — Z7984 Long term (current) use of oral hypoglycemic drugs: Secondary | ICD-10-CM | POA: Diagnosis not present

## 2019-11-04 DIAGNOSIS — Z79899 Other long term (current) drug therapy: Secondary | ICD-10-CM | POA: Diagnosis not present

## 2019-11-04 MED ORDER — CYANOCOBALAMIN 1000 MCG/ML IJ SOLN
1000.0000 ug | Freq: Once | INTRAMUSCULAR | Status: AC
  Administered 2019-11-04: 1000 ug via INTRAMUSCULAR

## 2019-11-04 NOTE — Telephone Encounter (Signed)
Yes

## 2019-11-04 NOTE — Progress Notes (Signed)
[facility] [phone]   Fax:([phone]  OFFICE PROGRESS NOTE  [name], MD [place] [address]  DIAGNOSIS:Recurrent non-small cell lung cancer likely squamous cell carcinoma that was initially diagnosed as a stage IIA(T2b, N0, M0) inAugust 2018status post curative stereotactic radiotherapy completed June 21, 2017. The patient presented today with concerning findings for disease recurrence and progression with enlarging and hypermetabolic right upper lobe lung mass presenting as stage IIIa (T3, N0, M0).  PDL 1 expression 0%.  PRIOR THERAPY: 1) Curative stereotactic radiotherapy completed on June 21, 2017. 2) A course of concurrent chemoradiation with chemotherapy consisting of carboplatin for an AUC of 2 and paclitaxel 45 mg/m.  First dose started on 06/23/2018.  Status post 7 cycles.  Last dose of chemotherapy was given August 04, 2018. 3) Consolidation immunotherapy with Imfinzi 10 mg/KG every 2 weeks.  First dose September 09, 2018.  Status post 26 cycles.   CURRENT THERAPY:None.  INTERVAL HISTORY: [name] 79 y.o. female returns to the clinic today for follow-up visit.  The patient is very anxious and nervous about her scan results today.  Her blood pressure was elevated.  She denied having any current chest pain but continues to have the baseline shortness of breath with mild cough and no hemoptysis.  She denied having any fever or chills.  She has no nausea, vomiting, diarrhea or constipation.  She denied having any headache or visual changes.  She had repeat CT scan of the chest performed recently and she is here for evaluation and discussion of her scan results and treatment options.  MEDICAL HISTORY: Past Medical History:  Diagnosis Date  . Cancer ([place])    Lung   . Carotid artery occlusion   . Clostridium difficile infection 06/2013  . Diabetes mellitus    takes Janumet daily  .  Dyslipidemia    takes Crestor daily  . Gallstones   . GERD (gastroesophageal reflux disease)   . Heart murmur    hx of  . History of radiation therapy 06/11/17-06/21/17   right lung 50 Gy in 5 fractions  . Hypertension    takes Prinzide and Verapamil daily  . Impaired speech    from stroke  . Neuropathy, diabetic ([place])   . Pneumonia   . PONV (postoperative nausea and vomiting)   . Seizures ([place]) 04/19/2018   recent hospitalization  . Smoker   . Stroke ([place]) 06/25/13  . Vertigo    but doesn't take any meds    ALLERGIES:  is allergic to codeine; benadryl [diphenhydramine]; lipitor [atorvastatin]; and phenergan [promethazine hcl].  MEDICATIONS:  Current Outpatient Medications  Medication Sig Dispense Refill  . aspirin EC 81 MG tablet Take 81 mg by mouth daily.    . Blood Glucose Monitoring Suppl (ONE TOUCH ULTRA 2) w/Device KIT 1 Device by Does not apply route 2 (two) times daily. 1 each 0  . divalproex (DEPAKOTE ER) 500 MG 24 hr tablet Take one tablet  twice daily 60 tablet 6  . glucose blood (ONE TOUCH ULTRA TEST) test strip Use as instructed 100 each 12  . Lancets (ONETOUCH ULTRASOFT) lancets Use as instructed 100 each 12  . lidocaine-prilocaine (EMLA) cream APPLY TO THE AFFECTED AREA DAILY AS NEEDED (Patient taking differently: Apply 1 application topically daily as needed (Pain). ) 30 g 0  . lisinopril (ZESTRIL) 10 MG tablet TAKE 1 TABLET(10 MG) BY MOUTH DAILY 90 tablet 3  . Multiple Vitamins-Minerals (MULTIVITAMIN  PO) Take 1 tablet by mouth daily.    . pioglitazone (ACTOS) 30 MG tablet TAKE 1 TABLET(30 MG) BY MOUTH DAILY 90 tablet 1  . prochlorperazine (COMPAZINE) 10 MG tablet Take 1 tablet (10 mg total) by mouth every 6 (six) hours as needed for nausea or vomiting. (Patient not taking: Reported on 08/25/2019) 30 tablet 0  . rosuvastatin (CRESTOR) 20 MG tablet TAKE 1 TABLET BY MOUTH ONCE DAILY 180 tablet 0  . SitaGLIPtin-MetFORMIN HCl (JANUMET XR) 50-1000 MG TB24 Take 1 tablet  by mouth daily. 60 tablet 5  . vitamin B-12 (CYANOCOBALAMIN) 1000 MCG tablet Take 1,000 mcg by mouth daily.     No current facility-administered medications for this visit.   Facility-Administered Medications Ordered in Other Visits  Medication Dose Route Frequency Provider Last Rate Last Admin  . heparin lock flush 100 unit/mL  500 Units Intracatheter Once PRN Curt Bears, MD      . sodium chloride flush (NS) 0.9 % injection 10 mL  10 mL Intracatheter PRN Curt Bears, MD        SURGICAL HISTORY:  Past Surgical History:  Procedure Laterality Date  . cataract removed Right   . CHOLECYSTECTOMY    . COLONOSCOPY    . ENDARTERECTOMY Left 07/14/2013   Procedure: ENDARTERECTOMY CAROTID-LEFT;  Surgeon: Elam Dutch, MD;  Location: [facility];  Service: Vascular;  Laterality: Left;  . EYE SURGERY Left    cataract  . IR CV LINE INJECTION  05/13/2019  . IR IMAGING GUIDED PORT INSERTION  09/15/2018  . IR PATIENT EVAL TECH 0-60 MINS  05/08/2019  . IR PERC PLEURAL DRAIN W/INDWELL CATH W/IMG GUIDE  05/23/2017  . KNEE SURGERY Left 52yr ago  . LAPAROSCOPIC PARTIAL COLECTOMY N/A 09/02/2015   Procedure: LAPAROSCOPIC PARTIAL RIGHT COLECTOMY;  Surgeon: ALeighton Ruff MD;  Location[facility];  Service: General;  Laterality: N/A;  . PATCH ANGIOPLASTY Left 07/14/2013   Procedure: LEFT CAROTID ARTERY PATCH ANGIOPLASTY;  Surgeon: CElam Dutch MD;  [facility]  Service: Vascular;  Laterality: Left;  .Marland KitchenVIDEO BRONCHOSCOPY N/A 06/04/2018   Procedure: VIDEO BRONCHOSCOPY;  Surgeon: HMelrose Nakayama MD;  [facility];  Service: Thoracic;  Laterality: N/A;  . wisdom      REVIEW OF SYSTEMS:  Constitutional: positive for fatigue Eyes: negative Ears, nose, mouth, throat, and face: negative Respiratory: positive for cough and dyspnea on exertion Cardiovascular: negative Gastrointestinal: negative Genitourinary:negative Integument/breast: negative Hematologic/lymphatic:  negative Musculoskeletal:negative Neurological: negative Behavioral/Psych: negative Endocrine: negative Allergic/Immunologic: negative   PHYSICAL EXAMINATION: General appearance: alert, cooperative, fatigued and no distress Head: Normocephalic, without obvious abnormality, atraumatic Neck: no adenopathy, no JVD, supple, symmetrical, trachea midline and thyroid not enlarged, symmetric, no tenderness/mass/nodules Lymph nodes: Cervical, supraclavicular, and axillary nodes normal. Resp: diminished breath sounds RUL and dullness to percussion RUL Back: symmetric, no curvature. ROM normal. No CVA tenderness. Cardio: regular rate and rhythm, S1, S2 normal, no murmur, click, rub or gallop GI: soft, non-tender; bowel sounds normal; no masses,  no organomegaly Extremities: extremities normal, atraumatic, no cyanosis or edema Neurologic: Alert and oriented X 3, normal strength and tone. Normal symmetric reflexes. Normal coordination and gait  ECOG PERFORMANCE STATUS: 1 - Symptomatic but completely ambulatory  Blood pressure (!) 158/65, pulse 91, temperature 98.7 F (37.1 C), temperature source Temporal, resp. rate 18, height '5\' 6"'  (1.676 m), weight 135 lb 1.6 oz (61.3 kg), SpO2 98 %.  LABORATORY DATA: Lab Results  Component Value Date   WBC 13.0 (H) 11/02/2019  HGB 11.1 (L) 11/02/2019   HCT 35.3 (L) 11/02/2019   MCV 87.8 11/02/2019   PLT 562 (H) 11/02/2019      Chemistry      Component Value Date/Time   NA 138 11/02/2019 1108   NA 139 05/07/2018 1147   K 4.1 11/02/2019 1108   CL 103 11/02/2019 1108   CO2 27 11/02/2019 1108   BUN 14 11/02/2019 1108   BUN 15 05/07/2018 1147   CREATININE 0.91 11/02/2019 1108   CREATININE 0.87 03/08/2015 0001      Component Value Date/Time   CALCIUM 9.8 11/02/2019 1108   ALKPHOS 77 11/02/2019 1108   AST 12 (L) 11/02/2019 1108   ALT 9 11/02/2019 1108   BILITOT 0.4 11/02/2019 1108       RADIOGRAPHIC STUDIES: DG Shoulder Right  Result Date:  10/29/2019 CLINICAL DATA:  Right shoulder pain for 3 months with no injury. EXAM: RIGHT SHOULDER - 2+ VIEW COMPARISON:  None. FINDINGS: There is no evidence of fracture or dislocation. Mild degenerative changes are seen involving the right acromioclavicular and glenohumeral joints. Soft tissues are unremarkable. IMPRESSION: Mild degenerative joint disease of the right acromioclavicular and glenohumeral joints. No acute abnormality seen in the right shoulder. Electronically Signed   By: Marijo Conception M.D.   On: 10/29/2019 16:25   CT Chest W Contrast  Result Date: 11/02/2019 CLINICAL DATA:  Non-small-cell lung cancer.  Restaging. EXAM: CT CHEST WITH CONTRAST TECHNIQUE: Multidetector CT imaging of the chest was performed during intravenous contrast administration. CONTRAST:  14m OMNIPAQUE IOHEXOL 300 MG/ML  SOLN COMPARISON:  09/07/2019 FINDINGS: Cardiovascular: Right Port-A-Cath tip at low SVC. Mild mass-effect upon the SVC, including on 53/2. Felt to be similar. Aortic and branch vessel atherosclerosis. Normal heart size, without pericardial effusion. Multivessel coronary artery atherosclerosis. No central pulmonary embolism, on this non-dedicated study. Mediastinum/Nodes: No supraclavicular adenopathy. A precarinal node measures 1.1 cm on 50/2 and demonstrates central necrosis. Compare 8 mm on the prior. No hilar adenopathy. Fluid level in the esophagus on 36/2. Lungs/Pleura: Small right pleural effusion, mildly decreased. Anterior loculation is again identified inferiorly. Similar right upper lobe endobronchial obstruction. Right upper lobe heterogeneous lung mass measures on the order of 6.6 x 5.3 cm on 46/2. Compare maximally 7.0 x 4.8 cm on the prior exam. New or progressive mediastinal involvement including on 46/2. Persistent surrounding collapse/consolidation throughout the right upper lobe. Motion degradation in the lower chest. Central superior segment left lower lobe soft tissue density is at the  site of soft tissue and ground-glass nodular densities on the prior exam. Example at 3.2 x 2.1 cm on 54/7. Upper Abdomen: Cholecystectomy. Motion degradation continuing into the upper abdomen. Adrenal thickening and nodularity are unchanged. Suspect cysts in the upper pole left kidney. Cholecystectomy. Musculoskeletal: Similar appearance of nonacute fracture of the anterior right second rib. IMPRESSION: 1. The central right upper lobe lung mass is relatively similar with surrounding right upper lobe collapse/consolidative change. 2. Disease progression, as evidenced by precarinal nodal metastasis and new or increased mediastinal involvement by primary. 3. At the site of previously described left lower lobe ground-glass and soft tissue density nodules is confluent soft tissue density. Presuming the patient has not received radiation therapy to this area, considerations include progressive metastatic disease or a focus of infection. 4. Decrease in small right pleural effusion with persistent loculation. 5. Motion degradation. 6. Coronary artery atherosclerosis. Aortic Atherosclerosis (ICD10-I70.0). 7. Esophageal air fluid level suggests dysmotility or gastroesophageal reflux. Electronically Signed   By: KMarylyn Ishihara  Jobe Igo M.D.   On: 11/02/2019 14:46    ASSESSMENT AND PLAN: This is a very pleasant 79 years old white female with recurrent non-small cell lung cancer presented as a stage IIIa involving the right lower lobe. The patient underwent a course of concurrent chemoradiation with weekly carboplatin and paclitaxel status post 7 cycles.  She tolerated this treatment well. The patient completed a course of consolidation immunotherapy with Imfinzi status post 26 cycles.  She tolerated her treatment well with no concerning complaints. The patient is currently on observation. She had repeat CT scan of the chest performed recently.  I personally and independently reviewed the scans and discussed the results with the  patient and I also called her daughter [name] by phone. Head scan showed almost a stable disease in the central right upper lobe obstructive mass with collapse and consolidative changes.  There is mild increase in precarinal nodal disease suspicious for disease progression. I recommended for the patient to have a PET scan for further evaluation of these changes in her lung.  If the scan showed hypermetabolic activity in the consolidative lesion or the mediastinal lymph nodes, we may consider the patient for additional systemic therapy. She will come back for follow-up visit in 2 weeks for evaluation and discussion of the PET scan results and treatment options. For the hypertension, she was very anxious about her scan results and she will monitor her blood pressure closely at home. The patient was advised to call immediately if she has any concerning symptoms in the interval. The patient voices understanding of current disease status and treatment options and is in agreement with the current care plan. All questions were answered. The patient knows to call the clinic with any problems, questions or concerns. We can certainly see the patient much sooner if necessary.  Disclaimer: This note was dictated with voice recognition software. Similar sounding words can inadvertently be transcribed and may not be corrected upon review.

## 2019-11-04 NOTE — Telephone Encounter (Signed)
I gave this patient a B12 injection. Last B12 level was 05/2018. Would you like her to have one prior to nect injection?

## 2019-11-04 NOTE — Patient Instructions (Signed)
Steps to Quit Smoking Smoking tobacco is the leading cause of preventable death. It can affect almost every organ in the body. Smoking puts you and people around you at risk for many serious, long-lasting (chronic) diseases. Quitting smoking can be hard, but it is one of the best things that you can do for your health. It is never too late to quit. How do I get ready to quit? When you decide to quit smoking, make a plan to help you succeed. Before you quit:  Pick a date to quit. Set a date within the next 2 weeks to give you time to prepare.  Write down the reasons why you are quitting. Keep this list in places where you will see it often.  Tell your family, friends, and co-workers that you are quitting. Their support is important.  Talk with your doctor about the choices that may help you quit.  Find out if your health insurance will pay for these treatments.  Know the people, places, things, and activities that make you want to smoke (triggers). Avoid them. What first steps can I take to quit smoking?  Throw away all cigarettes at home, at work, and in your car.  Throw away the things that you use when you smoke, such as ashtrays and lighters.  Clean your car. Make sure to empty the ashtray.  Clean your home, including curtains and carpets. What can I do to help me quit smoking? Talk with your doctor about taking medicines and seeing a counselor at the same time. You are more likely to succeed when you do both.  If you are pregnant or breastfeeding, talk with your doctor about counseling or other ways to quit smoking. Do not take medicine to help you quit smoking unless your doctor tells you to do so. To quit smoking: Quit right away  Quit smoking totally, instead of slowly cutting back on how much you smoke over a period of time.  Go to counseling. You are more likely to quit if you go to counseling sessions regularly. Take medicine You may take medicines to help you quit. Some  medicines need a prescription, and some you can buy over-the-counter. Some medicines may contain a drug called nicotine to replace the nicotine in cigarettes. Medicines may:  Help you to stop having the desire to smoke (cravings).  Help to stop the problems that come when you stop smoking (withdrawal symptoms). Your doctor may ask you to use:  Nicotine patches, gum, or lozenges.  Nicotine inhalers or sprays.  Non-nicotine medicine that is taken by mouth. Find resources Find resources and other ways to help you quit smoking and remain smoke-free after you quit. These resources are most helpful when you use them often. They include:  Online chats with a counselor.  Phone quitlines.  Printed self-help materials.  Support groups or group counseling.  Text messaging programs.  Mobile phone apps. Use apps on your mobile phone or tablet that can help you stick to your quit plan. There are many free apps for mobile phones and tablets as well as websites. Examples include Quit Guide from the CDC and smokefree.gov  What things can I do to make it easier to quit?   Talk to your family and friends. Ask them to support and encourage you.  Call a phone quitline (1-800-QUIT-NOW), reach out to support groups, or work with a counselor.  Ask people who smoke to not smoke around you.  Avoid places that make you want to smoke,   such as: ? Bars. ? Parties. ? Smoke-break areas at work.  Spend time with people who do not smoke.  Lower the stress in your life. Stress can make you want to smoke. Try these things to help your stress: ? Getting regular exercise. ? Doing deep-breathing exercises. ? Doing yoga. ? Meditating. ? Doing a body scan. To do this, close your eyes, focus on one area of your body at a time from head to toe. Notice which parts of your body are tense. Try to relax the muscles in those areas. How will I feel when I quit smoking? Day 1 to 3 weeks Within the first 24 hours,  you may start to have some problems that come from quitting tobacco. These problems are very bad 2-3 days after you quit, but they do not often last for more than 2-3 weeks. You may get these symptoms:  Mood swings.  Feeling restless, nervous, angry, or annoyed.  Trouble concentrating.  Dizziness.  Strong desire for high-sugar foods and nicotine.  Weight gain.  Trouble pooping (constipation).  Feeling like you may vomit (nausea).  Coughing or a sore throat.  Changes in how the medicines that you take for other issues work in your body.  Depression.  Trouble sleeping (insomnia). Week 3 and afterward After the first 2-3 weeks of quitting, you may start to notice more positive results, such as:  Better sense of smell and taste.  Less coughing and sore throat.  Slower heart rate.  Lower blood pressure.  Clearer skin.  Better breathing.  Fewer sick days. Quitting smoking can be hard. Do not give up if you fail the first time. Some people need to try a few times before they succeed. Do your best to stick to your quit plan, and talk with your doctor if you have any questions or concerns. Summary  Smoking tobacco is the leading cause of preventable death. Quitting smoking can be hard, but it is one of the best things that you can do for your health.  When you decide to quit smoking, make a plan to help you succeed.  Quit smoking right away, not slowly over a period of time.  When you start quitting, seek help from your doctor, family, or friends. This information is not intended to replace advice given to you by your health care provider. Make sure you discuss any questions you have with your health care provider. Document Revised: 03/27/2019 Document Reviewed: 09/20/2018 Elsevier Patient Education  2020 [employer]vier Inc.  

## 2019-11-05 ENCOUNTER — Telehealth: Payer: Self-pay | Admitting: Internal Medicine

## 2019-11-05 NOTE — Telephone Encounter (Signed)
Scheduled per los. Called and spoke with patient. Confirmed appt 

## 2019-11-05 NOTE — Telephone Encounter (Signed)
Scheduled her for B12 level and then b12 shot for 12/02/19.

## 2019-11-10 ENCOUNTER — Other Ambulatory Visit: Payer: Self-pay

## 2019-11-10 ENCOUNTER — Encounter: Payer: Self-pay | Admitting: Orthopaedic Surgery

## 2019-11-10 ENCOUNTER — Ambulatory Visit: Payer: Medicare Other | Admitting: Orthopaedic Surgery

## 2019-11-10 DIAGNOSIS — G8929 Other chronic pain: Secondary | ICD-10-CM

## 2019-11-10 DIAGNOSIS — M25511 Pain in right shoulder: Secondary | ICD-10-CM

## 2019-11-10 NOTE — Progress Notes (Signed)
Office Visit Note   Patient: [name]           Date of Birth: Nov 29, 1940           MRN: [id number] Visit Date: 11/10/2019              Requested by: [name], MD [address] [place],  [place] [address] PCP: [name], MD   Assessment & Plan: Visit Diagnoses:  1. Chronic right shoulder pain     Plan: Impression is right adhesive capsulitis.  We discussed treatment in terms of a cortisone injection and physical therapy.  Patient would only like to do physical therapy at this time.  She will call us back if she wants to have an ultrasound-guided cortisone injection with Dr. Junius Roads in the future.  Follow-up as needed.  Follow-Up Instructions: Return if symptoms worsen or fail to improve.   Orders:  Orders Placed This Encounter  Procedures  . Ambulatory referral to Physical Therapy   No orders of the defined types were placed in this encounter.     Procedures: No procedures performed   Clinical Data: No additional findings.   Subjective: Chief Complaint  Patient presents with  . Right Shoulder - Pain    [name] is a 79 year old female comes in with her daughter for evaluation of chronic right shoulder pain for 3 to 6 months.  Denies any known injuries.  Denies any radiculopathy.  She is right-hand dominant.  She endorses chronic aching pain that is worse with movement of the shoulder.   Review of Systems  Constitutional: Negative.   HENT: Negative.   Eyes: Negative.   Respiratory: Negative.   Cardiovascular: Negative.   Endocrine: Negative.   Musculoskeletal: Negative.   Neurological: Negative.   Hematological: Negative.   Psychiatric/Behavioral: Negative.   All other systems reviewed and are negative.    Objective: Vital Signs: There were no vitals taken for this visit.  Physical Exam Vitals and nursing note reviewed.  Constitutional:      Appearance: She is well-developed.  HENT:     Head: Normocephalic and atraumatic.    Pulmonary:     Effort: Pulmonary effort is normal.  Abdominal:     Palpations: Abdomen is soft.  Musculoskeletal:     Cervical back: Neck supple.  Skin:    General: Skin is warm.     Capillary Refill: Capillary refill takes less than 2 seconds.  Neurological:     Mental Status: She is alert and oriented to person, place, and time.  Psychiatric:        Behavior: Behavior normal.        Thought Content: Thought content normal.        Judgment: Judgment normal.     Ortho Exam Right shoulder shows moderate limitation range of motion secondary to pain.  Manual muscle testing is grossly normal. Specialty Comments:  No specialty comments available.  Imaging: No results found.   PMFS History: Patient Active Problem List   Diagnosis Date Noted  . Chronic right shoulder pain 11/10/2019  . Port-A-Cath in place 10/21/2018  . Stage III squamous cell carcinoma of right lung ([place]) 06/09/2018  . Encounter for antineoplastic chemotherapy 06/09/2018  . Encounter for antineoplastic immunotherapy 06/09/2018  . Goals of care, counseling/discussion 05/16/2018  . Partial seizure ([place]) 04/21/2018  . TIA (transient ischemic attack) 04/09/2018  . Malnutrition of moderate degree 03/01/2018  . Non-small cell lung cancer ([place]) 05/28/2017  . Migraine equivalent 04/15/2017  .  Hyperlipidemia associated with type 2 diabetes mellitus ([place]) 03/07/2016  . Adenomatous colon polyp s/p laparoscopic right colectomy 09/02/15 09/02/2015  . Hemispheric carotid artery syndrome 04/07/2015  . Cataract associated with type 2 diabetes mellitus ([place]) 03/08/2015  . S/P recent Left carotid endarterectomy 07/14/2013  . History of CVA (cerebrovascular accident) 06/27/2013  . Hypertension associated with diabetes ([place]) 01/04/2011  . DM type 2 with diabetic dyslipidemia ([place]) 01/04/2011  . Light smoker 01/04/2011   Past Medical History:  Diagnosis Date  . Cancer ([facility])    Lung   . Carotid artery occlusion   .  Clostridium difficile infection 06/2013  . Diabetes mellitus    takes Janumet daily  . Dyslipidemia    takes Crestor daily  . Gallstones   . GERD (gastroesophageal reflux disease)   . Heart murmur    hx of  . History of radiation therapy 06/11/17-06/21/17   right lung 50 Gy in 5 fractions  . Hypertension    takes Prinzide and Verapamil daily  . Impaired speech    from stroke  . Neuropathy, diabetic ([place])   . Pneumonia   . PONV (postoperative nausea and vomiting)   . Seizures ([place]) 04/19/2018   recent hospitalization  . Smoker   . Stroke ([place]) 06/25/13  . Vertigo    but doesn't take any meds    Family History  Problem Relation Age of Onset  . Leukemia Mother   . Diabetes Mother   . Cancer Father        esophageal ca  . Heart disease Father   . Diabetes Maternal Aunt   . Diabetes Maternal Uncle   . Diabetes Brother   . Diabetes Sister   . Heart attack Brother        x 2    Past Surgical History:  Procedure Laterality Date  . cataract removed Right   . CHOLECYSTECTOMY    . COLONOSCOPY    . ENDARTERECTOMY Left 07/14/2013   Procedure: ENDARTERECTOMY CAROTID-LEFT;  Surgeon: Elam Dutch, MD;  Location: [facility];  Service: Vascular;  Laterality: Left;  . EYE SURGERY Left    cataract  . IR CV LINE INJECTION  05/13/2019  . IR IMAGING GUIDED PORT INSERTION  09/15/2018  . IR PATIENT EVAL TECH 0-60 MINS  05/08/2019  . IR PERC PLEURAL DRAIN W/INDWELL CATH W/IMG GUIDE  05/23/2017  . KNEE SURGERY Left 40yrs ago  . LAPAROSCOPIC PARTIAL COLECTOMY N/A 09/02/2015   Procedure: LAPAROSCOPIC PARTIAL RIGHT COLECTOMY;  Surgeon: Leighton Ruff, MD;  [facility];  Service: General;  Laterality: N/A;  . PATCH ANGIOPLASTY Left 07/14/2013   Procedure: LEFT CAROTID ARTERY PATCH ANGIOPLASTY;  Surgeon: Elam Dutch, MD;  Location: [facility];  Service: Vascular;  Laterality: Left;  Marland Kitchen VIDEO BRONCHOSCOPY N/A 06/04/2018   Procedure: VIDEO BRONCHOSCOPY;  Surgeon: Melrose Nakayama, MD;   [facility];  Service: Thoracic;  Laterality: N/A;  . wisdom     Social History   Occupational History  . Occupation: retired  Tobacco Use  . Smoking status: Current Every Day Smoker    Packs/day: 0.50    Years: 60.00    Pack years: 30.00    Types: Cigarettes  . Smokeless tobacco: Never Used  Substance and Sexual Activity  . Alcohol use: No    Alcohol/week: 0.0 standard drinks  . Drug use: No  . Sexual activity: Yes

## 2019-11-17 ENCOUNTER — Ambulatory Visit (HOSPITAL_COMMUNITY)
Admission: RE | Admit: 2019-11-17 | Discharge: 2019-11-17 | Disposition: A | Discharge status: Home / Self Care | Payer: Medicare Other | Source: Ambulatory Visit | Attending: Internal Medicine | Admitting: Internal Medicine

## 2019-11-17 ENCOUNTER — Other Ambulatory Visit: Payer: Self-pay

## 2019-11-17 DIAGNOSIS — Z79899 Other long term (current) drug therapy: Secondary | ICD-10-CM | POA: Insufficient documentation

## 2019-11-17 DIAGNOSIS — J90 Pleural effusion, not elsewhere classified: Secondary | ICD-10-CM | POA: Insufficient documentation

## 2019-11-17 DIAGNOSIS — C3490 Malignant neoplasm of unspecified part of unspecified bronchus or lung: Secondary | ICD-10-CM | POA: Diagnosis not present

## 2019-11-17 DIAGNOSIS — Z5111 Encounter for antineoplastic chemotherapy: Secondary | ICD-10-CM | POA: Diagnosis not present

## 2019-11-17 DIAGNOSIS — C771 Secondary and unspecified malignant neoplasm of intrathoracic lymph nodes: Secondary | ICD-10-CM | POA: Insufficient documentation

## 2019-11-17 LAB — GLUCOSE, CAPILLARY: Glucose-Capillary: 157 mg/dL — ABNORMAL HIGH (ref 70–99)

## 2019-11-17 MED ORDER — FLUDEOXYGLUCOSE F - 18 (FDG) INJECTION
6.7700 | Freq: Once | INTRAVENOUS | Status: AC | PRN
  Administered 2019-11-17: 6.77 via INTRAVENOUS

## 2019-11-18 ENCOUNTER — Encounter: Payer: Self-pay | Admitting: Physical Therapy

## 2019-11-18 ENCOUNTER — Ambulatory Visit: Payer: Medicare Other | Admitting: Physical Therapy

## 2019-11-18 DIAGNOSIS — G8929 Other chronic pain: Secondary | ICD-10-CM | POA: Diagnosis not present

## 2019-11-18 DIAGNOSIS — M25511 Pain in right shoulder: Secondary | ICD-10-CM

## 2019-11-18 DIAGNOSIS — M6281 Muscle weakness (generalized): Secondary | ICD-10-CM

## 2019-11-18 DIAGNOSIS — M25611 Stiffness of right shoulder, not elsewhere classified: Secondary | ICD-10-CM

## 2019-11-18 DIAGNOSIS — R293 Abnormal posture: Secondary | ICD-10-CM

## 2019-11-18 NOTE — Therapy (Signed)
[place], [place], [address] Phone: [phone]   Fax:  [phone]  Physical Therapy Evaluation  Patient Details  Name: [name] MRN: [id number] Date of Birth: 1941-03-02 Referring Provider (PT): Leandrew Koyanagi, MD   Encounter Date: 11/18/2019  PT End of Session - 11/18/19 1513    Visit Number  1    Number of Visits  12    Date for PT Re-Evaluation  12/30/19    PT Start Time  6160    PT Stop Time  1510    PT Time Calculation (min)  33 min    Activity Tolerance  Patient tolerated treatment well    Behavior During Therapy  [facility] for tasks assessed/performed       Past Medical History:  Diagnosis Date  . Cancer ([place])    Lung   . Carotid artery occlusion   . Clostridium difficile infection 06/2013  . Diabetes mellitus    takes Janumet daily  . Dyslipidemia    takes Crestor daily  . Gallstones   . GERD (gastroesophageal reflux disease)   . Heart murmur    hx of  . History of radiation therapy 06/11/17-06/21/17   right lung 50 Gy in 5 fractions  . Hypertension    takes Prinzide and Verapamil daily  . Impaired speech    from stroke  . Neuropathy, diabetic ([place])   . Pneumonia   . PONV (postoperative nausea and vomiting)   . Seizures ([place] ) 04/19/2018   recent hospitalization  . Smoker   . Stroke ([place]) 06/25/13  . Vertigo    but doesn't take any meds    Past Surgical History:  Procedure Laterality Date  . cataract removed Right   . CHOLECYSTECTOMY    . COLONOSCOPY    . ENDARTERECTOMY Left 07/14/2013   Procedure: ENDARTERECTOMY CAROTID-LEFT;  Surgeon: Elam Dutch, MD;  Location: [facility];  Service: Vascular;  Laterality: Left;  . EYE SURGERY Left    cataract  . IR CV LINE INJECTION  05/13/2019  . IR IMAGING GUIDED PORT INSERTION  09/15/2018  . IR PATIENT EVAL TECH 0-60 MINS  05/08/2019  . IR PERC PLEURAL DRAIN W/INDWELL CATH W/IMG GUIDE  05/23/2017  . KNEE SURGERY Left 28yrs ago  . LAPAROSCOPIC  PARTIAL COLECTOMY N/A 09/02/2015   Procedure: LAPAROSCOPIC PARTIAL RIGHT COLECTOMY;  Surgeon: Leighton Ruff, MD;  Location: [facility];  Service: General;  Laterality: N/A;  . PATCH ANGIOPLASTY Left 07/14/2013   Procedure: LEFT CAROTID ARTERY PATCH ANGIOPLASTY;  Surgeon: Elam Dutch, MD;  Location: [facility];  Service: Vascular;  Laterality: Left;  Marland Kitchen VIDEO BRONCHOSCOPY N/A 06/04/2018   Procedure: VIDEO BRONCHOSCOPY;  Surgeon: Melrose Nakayama, MD;  Location: [facility];  Service: Thoracic;  Laterality: N/A;  . wisdom      There were no vitals filed for this visit.   Subjective Assessment - 11/18/19 1441    Subjective  Pt is a 79 y/o female who presents to OPPT for Rt shoulder pain, dx as frozen shoulder.  Pt reports pain began about 6 months ago without known injury.  Pt presents today with difficulty with ROM affecting ADLs.    Patient is accompained by:  Family member    Pertinent History  lung cancer, DM, HTN, neuropathy, seizures, stroke    Limitations  Lifting    Patient Stated Goals  improve motion and pain    Currently in Pain?  Yes    Pain Score  0-No pain  up to 4/10   Pain Location  Shoulder    Pain Orientation  Right    Pain Descriptors / Indicators  Tightness    Pain Type  Acute pain;Chronic pain    Pain Onset  More than a month ago    Pain Frequency  Intermittent    Aggravating Factors   end range active movements    Pain Relieving Factors  rest, keeping shoulder still         [facility] PT Assessment - 11/18/19 1445      Assessment   Medical Diagnosis  M25.511,G89.29 (ICD-10-CM) - Chronic right shoulder pain    Referring Provider (PT)  Leandrew Koyanagi, MD    Onset Date/Surgical Date  --   6 months   Hand Dominance  Right    Next MD Visit  PRN    Prior Therapy  n/a      Precautions   Precautions  None      Restrictions   Weight Bearing Restrictions  No      Balance Screen   Has the patient fallen in the past 6 months  No    Has the patient had a decrease in  activity level because of a fear of falling?   No    Is the patient reluctant to leave their home because of a fear of falling?   No      Home Environment   Living Environment  Private residence    Living Arrangements  Children    Available Help at Discharge  Available 24 hours/day;Family    Type of Home  House    Additional Comments  using a long towel for washing back      Prior Function   Level of [place]  Retired    Leisure  plays the [employer], watching TV, no regular exercise      Cognition   Overall Cognitive Status  Within Functional Limits for tasks assessed      Posture/Postural Control   Posture/Postural Control  Postural limitations    Postural Limitations  Rounded Shoulders;Forward head      ROM / Strength   AROM / PROM / Strength  AROM;Strength;PROM      AROM   Overall AROM Comments  flexion/abduction measured sitting    AROM Assessment Site  Shoulder    Right/Left Shoulder  Right    Right Shoulder Flexion  112 Degrees    Right Shoulder ABduction  105 Degrees    Right Shoulder Internal Rotation  --   to Rt glute   Right Shoulder External Rotation  --   to occiput with pain     PROM   PROM Assessment Site  Shoulder    Right/Left Shoulder  Right    Right Shoulder Flexion  132 Degrees    Right Shoulder ABduction  125 Degrees      Strength   Strength Assessment Site  Shoulder    Right/Left Shoulder  Right    Right Shoulder Flexion  4/5    Right Shoulder ABduction  3-/5    Right Shoulder Internal Rotation  4/5    Right Shoulder External Rotation  3/5      Palpation   Palpation comment  Rt shoulder muscle atrophy noted, trigger points in teres minor and infraspinatus                Objective measurements completed on examination: See above findings.      [place]  Adult PT Treatment/Exercise - 11/18/19 1445      Exercises   Exercises  Shoulder      Shoulder Exercises: Supine   External Rotation   AAROM;5 reps;Right   1# bar   Flexion  AAROM;5 reps   1# bar     Shoulder Exercises: Standing   Internal Rotation  AAROM;5 reps   1# bar   Extension  AAROM;5 reps   1# bar            PT Education - 11/18/19 1513    Education Details  HEP    Person(s) Educated  Patient    Methods  Explanation;Demonstration;Handout    Comprehension  Verbalized understanding;Returned demonstration;Need further instruction          PT Long Term Goals - 11/18/19 1604      PT LONG TERM GOAL #1   Title  independent with HEP    Status  New    Target Date  12/30/19      PT LONG TERM GOAL #2   Title  improve Rt shoulder flexion and abduction AROM by 20 degrees each direction for improved function    Status  New    Target Date  12/30/19      PT LONG TERM GOAL #3   Title  report pain < 3/10 with shoulder reaching tasks for improved function    Status  New    Target Date  12/30/19      PT LONG TERM GOAL #4   Title  report 50% improvement in ADLs for improved function    Status  New    Target Date  12/30/19             Plan - 11/18/19 1513    Clinical Impression Statement  Pt is a 79 y/o female who presents to OPPT with chronic Rt shoulder pain. Pt demonstrates postural abnormalities, decreased ROM and strength affecting functional mobilities.  Will beneift from PT to address deficits listed.  PT and progress may be limited depending on oncology visit tomorrow and next steps regarding lung cancer.    Personal Factors and Comorbidities  Comorbidity 3+    Comorbidities  active lung cancer, DM, HTN, neuropathy, seizures, stroke    Examination-Activity Limitations  Bathing;Lift;Reach Overhead;Dressing;Hygiene/Grooming    Examination-Participation Restrictions  Community Activity    Stability/Clinical Decision Making  Evolving/Moderate complexity    Clinical Decision Making  Moderate    Rehab Potential  Fair    PT Frequency  2x / week    PT Duration  6 weeks    PT  Treatment/Interventions  ADLs/Self Care Home Management;Cryotherapy;Ultrasound;Moist Heat;Iontophoresis 4mg /ml Dexamethasone;Functional mobility training;Therapeutic activities;Patient/family education;Therapeutic exercise;Manual techniques;Dry needling;Passive range of motion;Taping    PT Next Visit Plan  review HEP, continue with AA/AROM exercises, postural strength, PROM    PT Home Exercise Plan  Access Code: [email or web address]    Consulted and Agree with Plan of Care  Patient;Family member/caregiver    Family Member Consulted  daughter       Patient will benefit from skilled therapeutic intervention in order to improve the following deficits and impairments:  Increased fascial restricitons, Increased muscle spasms, Pain, Postural dysfunction, Decreased range of motion, Decreased strength, Hypomobility, Impaired UE functional use  Visit Diagnosis: Chronic right shoulder pain - Plan: PT plan of care cert/re-cert  Stiffness of right shoulder, not elsewhere classified - Plan: PT plan of care cert/re-cert  Abnormal posture - Plan: PT plan of care cert/re-cert  Muscle weakness (generalized) - Plan: PT  plan of care cert/re-cert     Problem List Patient Active Problem List   Diagnosis Date Noted  . Chronic right shoulder pain 11/10/2019  . Port-A-Cath in place 10/21/2018  . Stage III squamous cell carcinoma of right lung ([place]) 06/09/2018  . Encounter for antineoplastic chemotherapy 06/09/2018  . Encounter for antineoplastic immunotherapy 06/09/2018  . Goals of care, counseling/discussion 05/16/2018  . Partial seizure ([place]) 04/21/2018  . TIA (transient ischemic attack) 04/09/2018  . Malnutrition of moderate degree 03/01/2018  . Non-small cell lung cancer ([place]) 05/28/2017  . Migraine equivalent 04/15/2017  . Hyperlipidemia associated with type 2 diabetes mellitus ([place]) 03/07/2016  . Adenomatous colon polyp s/p laparoscopic right colectomy 09/02/15 09/02/2015  . Hemispheric carotid artery  syndrome 04/07/2015  . Cataract associated with type 2 diabetes mellitus ([place]) 03/08/2015  . S/P recent Left carotid endarterectomy 07/14/2013  . History of CVA (cerebrovascular accident) 06/27/2013  . Hypertension associated with diabetes ([place]) 01/04/2011  . DM type 2 with diabetic dyslipidemia ([place]) 01/04/2011  . Light smoker 01/04/2011      Laureen Abrahams, PT, DPT 11/18/19 4:08 PM    [facility] [address] [place], [place], [address] Phone: [phone]   Fax:  [phone]  Name: MYRLENE RIERA MRN: [id number] Date of Birth: [birth year]

## 2019-11-18 NOTE — Patient Instructions (Signed)
Access Code: [email or web address] URL: [email or web address] Date: 11/18/2019 Prepared by: Faustino Congress  Exercises Supine Shoulder Flexion with Dowel - 2 x daily - 7 x weekly - 1 sets - 15 reps - 3-5 sec hold Supine Shoulder External Rotation AAROM with Dowel - 2 x daily - 7 x weekly - 15 reps - 1 sets Standing Shoulder Extension with Dowel - 2 x daily - 7 x weekly - 1 sets - 15 reps Standing Bilateral Shoulder Internal Rotation AAROM with Dowel - 2 x daily - 7 x weekly - 1 sets - 15 reps

## 2019-11-19 ENCOUNTER — Other Ambulatory Visit: Payer: Self-pay

## 2019-11-19 ENCOUNTER — Inpatient Hospital Stay: Payer: Medicare Other | Attending: Internal Medicine | Admitting: Internal Medicine

## 2019-11-19 ENCOUNTER — Encounter: Payer: Self-pay | Admitting: Internal Medicine

## 2019-11-19 VITALS — BP 151/61 | HR 90 | Temp 98.5°F | Resp 18 | Ht 66.0 in | Wt 133.2 lb

## 2019-11-19 DIAGNOSIS — C3491 Malignant neoplasm of unspecified part of right bronchus or lung: Secondary | ICD-10-CM

## 2019-11-19 DIAGNOSIS — Z9221 Personal history of antineoplastic chemotherapy: Secondary | ICD-10-CM | POA: Insufficient documentation

## 2019-11-19 DIAGNOSIS — E785 Hyperlipidemia, unspecified: Secondary | ICD-10-CM | POA: Diagnosis not present

## 2019-11-19 DIAGNOSIS — I10 Essential (primary) hypertension: Secondary | ICD-10-CM | POA: Insufficient documentation

## 2019-11-19 DIAGNOSIS — Z923 Personal history of irradiation: Secondary | ICD-10-CM | POA: Diagnosis not present

## 2019-11-19 DIAGNOSIS — C3411 Malignant neoplasm of upper lobe, right bronchus or lung: Secondary | ICD-10-CM | POA: Insufficient documentation

## 2019-11-19 DIAGNOSIS — C7802 Secondary malignant neoplasm of left lung: Secondary | ICD-10-CM | POA: Diagnosis not present

## 2019-11-19 DIAGNOSIS — Z7189 Other specified counseling: Secondary | ICD-10-CM | POA: Diagnosis not present

## 2019-11-19 DIAGNOSIS — F17210 Nicotine dependence, cigarettes, uncomplicated: Secondary | ICD-10-CM | POA: Diagnosis not present

## 2019-11-19 DIAGNOSIS — Z79899 Other long term (current) drug therapy: Secondary | ICD-10-CM | POA: Insufficient documentation

## 2019-11-19 DIAGNOSIS — Z5189 Encounter for other specified aftercare: Secondary | ICD-10-CM | POA: Insufficient documentation

## 2019-11-19 DIAGNOSIS — E1140 Type 2 diabetes mellitus with diabetic neuropathy, unspecified: Secondary | ICD-10-CM | POA: Insufficient documentation

## 2019-11-19 DIAGNOSIS — Z7982 Long term (current) use of aspirin: Secondary | ICD-10-CM | POA: Insufficient documentation

## 2019-11-19 DIAGNOSIS — Z8673 Personal history of transient ischemic attack (TIA), and cerebral infarction without residual deficits: Secondary | ICD-10-CM | POA: Insufficient documentation

## 2019-11-19 DIAGNOSIS — Z5112 Encounter for antineoplastic immunotherapy: Secondary | ICD-10-CM | POA: Insufficient documentation

## 2019-11-19 DIAGNOSIS — Z5111 Encounter for antineoplastic chemotherapy: Secondary | ICD-10-CM | POA: Insufficient documentation

## 2019-11-19 DIAGNOSIS — Z7984 Long term (current) use of oral hypoglycemic drugs: Secondary | ICD-10-CM | POA: Insufficient documentation

## 2019-11-19 NOTE — Progress Notes (Signed)
DISCONTINUE ON PATHWAY REGIMEN - Non-Small Cell Lung     Administer weekly:     Paclitaxel      Carboplatin   **Always confirm dose/schedule in your pharmacy ordering system**  REASON: Disease Progression PRIOR TREATMENT: [id number]: Carboplatin AUC=2 + Paclitaxel 45 mg/m2 Weekly During Radiation TREATMENT RESPONSE: Progressive Disease (PD)  START ON PATHWAY REGIMEN - Non-Small Cell Lung     A cycle is every 21 days:     Pembrolizumab      Paclitaxel      Carboplatin   **Always confirm dose/schedule in your pharmacy ordering system**  Patient Characteristics: Stage IV Metastatic, Squamous, PS = 0, 1, First Line, PD-L1 Expression Positive 1-49% (TPS) / Negative / Not Tested / Awaiting Test Results and Immunotherapy Candidate Therapeutic Status: Stage IV Metastatic Histology: Squamous Cell Line of therapy: First Line ECOG Performance Status: 1 PD-L1 Expression Status: PD-L1 Negative Immunotherapy Candidate Status: Candidate for Immunotherapy Intent of Therapy: Non-Curative / Palliative Intent, Discussed with Patient

## 2019-11-19 NOTE — Patient Instructions (Signed)
Steps to Quit Smoking Smoking tobacco is the leading cause of preventable death. It can affect almost every organ in the body. Smoking puts you and people around you at risk for many serious, long-lasting (chronic) diseases. Quitting smoking can be hard, but it is one of the best things that you can do for your health. It is never too late to quit. How do I get ready to quit? When you decide to quit smoking, make a plan to help you succeed. Before you quit:  Pick a date to quit. Set a date within the next 2 weeks to give you time to prepare.  Write down the reasons why you are quitting. Keep this list in places where you will see it often.  Tell your family, friends, and co-workers that you are quitting. Their support is important.  Talk with your doctor about the choices that may help you quit.  Find out if your health insurance will pay for these treatments.  Know the people, places, things, and activities that make you want to smoke (triggers). Avoid them. What first steps can I take to quit smoking?  Throw away all cigarettes at home, at work, and in your car.  Throw away the things that you use when you smoke, such as ashtrays and lighters.  Clean your car. Make sure to empty the ashtray.  Clean your home, including curtains and carpets. What can I do to help me quit smoking? Talk with your doctor about taking medicines and seeing a counselor at the same time. You are more likely to succeed when you do both.  If you are pregnant or breastfeeding, talk with your doctor about counseling or other ways to quit smoking. Do not take medicine to help you quit smoking unless your doctor tells you to do so. To quit smoking: Quit right away  Quit smoking totally, instead of slowly cutting back on how much you smoke over a period of time.  Go to counseling. You are more likely to quit if you go to counseling sessions regularly. Take medicine You may take medicines to help you quit. Some  medicines need a prescription, and some you can buy over-the-counter. Some medicines may contain a drug called nicotine to replace the nicotine in cigarettes. Medicines may:  Help you to stop having the desire to smoke (cravings).  Help to stop the problems that come when you stop smoking (withdrawal symptoms). Your doctor may ask you to use:  Nicotine patches, gum, or lozenges.  Nicotine inhalers or sprays.  Non-nicotine medicine that is taken by mouth. Find resources Find resources and other ways to help you quit smoking and remain smoke-free after you quit. These resources are most helpful when you use them often. They include:  Online chats with a counselor.  Phone quitlines.  Printed self-help materials.  Support groups or group counseling.  Text messaging programs.  Mobile phone apps. Use apps on your mobile phone or tablet that can help you stick to your quit plan. There are many free apps for mobile phones and tablets as well as websites. Examples include Quit Guide from the CDC and smokefree.gov  What things can I do to make it easier to quit?   Talk to your family and friends. Ask them to support and encourage you.  Call a phone quitline (1-800-QUIT-NOW), reach out to support groups, or work with a counselor.  Ask people who smoke to not smoke around you.  Avoid places that make you want to smoke,   such as: ? Bars. ? Parties. ? Smoke-break areas at work.  Spend time with people who do not smoke.  Lower the stress in your life. Stress can make you want to smoke. Try these things to help your stress: ? Getting regular exercise. ? Doing deep-breathing exercises. ? Doing yoga. ? Meditating. ? Doing a body scan. To do this, close your eyes, focus on one area of your body at a time from head to toe. Notice which parts of your body are tense. Try to relax the muscles in those areas. How will I feel when I quit smoking? Day 1 to 3 weeks Within the first 24 hours,  you may start to have some problems that come from quitting tobacco. These problems are very bad 2-3 days after you quit, but they do not often last for more than 2-3 weeks. You may get these symptoms:  Mood swings.  Feeling restless, nervous, angry, or annoyed.  Trouble concentrating.  Dizziness.  Strong desire for high-sugar foods and nicotine.  Weight gain.  Trouble pooping (constipation).  Feeling like you may vomit (nausea).  Coughing or a sore throat.  Changes in how the medicines that you take for other issues work in your body.  Depression.  Trouble sleeping (insomnia). Week 3 and afterward After the first 2-3 weeks of quitting, you may start to notice more positive results, such as:  Better sense of smell and taste.  Less coughing and sore throat.  Slower heart rate.  Lower blood pressure.  Clearer skin.  Better breathing.  Fewer sick days. Quitting smoking can be hard. Do not give up if you fail the first time. Some people need to try a few times before they succeed. Do your best to stick to your quit plan, and talk with your doctor if you have any questions or concerns. Summary  Smoking tobacco is the leading cause of preventable death. Quitting smoking can be hard, but it is one of the best things that you can do for your health.  When you decide to quit smoking, make a plan to help you succeed.  Quit smoking right away, not slowly over a period of time.  When you start quitting, seek help from your doctor, family, or friends. This information is not intended to replace advice given to you by your health care provider. Make sure you discuss any questions you have with your health care provider. Document Revised: 03/27/2019 Document Reviewed: 09/20/2018 Elsevier Patient Education  2020 [employer]vier Inc.  

## 2019-11-19 NOTE — Progress Notes (Signed)
[place] [phone]   [phone]  OFFICE PROGRESS NOTE  [name], MD [place] [address]  DIAGNOSIS:Recurrent non-small cell lung cancer likely squamous cell carcinoma that was initially diagnosed as a stage IIA(T2b, N0, M0) inAugust 2018status post curative stereotactic radiotherapy completed June 21, 2017. The patient presented today with concerning findings for disease recurrence and progression with enlarging and hypermetabolic right upper lobe lung mass presenting as stage IIIa (T3, N0, M0).  PDL 1 expression 0%.  PRIOR THERAPY: 1) Curative stereotactic radiotherapy completed on June 21, 2017. 2) A course of concurrent chemoradiation with chemotherapy consisting of carboplatin for an AUC of 2 and paclitaxel 45 mg/m.  First dose started on 06/23/2018.  Status post 7 cycles.  Last dose of chemotherapy was given August 04, 2018. 3) Consolidation immunotherapy with Imfinzi 10 mg/KG every 2 weeks.  First dose September 09, 2018.  Status post 26 cycles.   CURRENT THERAPY: Systemic chemotherapy with carboplatin for AUC of 5, paclitaxel 175 NG/M2 and Keytruda 200 mg IV every 3 weeks.  First dose Nov 25, 2019.  INTERVAL HISTORY: [name] 79 y.o. female returns to the clinic today for follow-up visit.  The patient is feeling fine today with no concerning complaints except for persistent dry cough and shortness of breath with exertion.  She denied having any chest pain or hemoptysis.  She denied having any recent weight loss or night sweats.  She has no nausea, vomiting, diarrhea or constipation.  She denied having any headache or visual changes.  Unfortunately she continues to smoke around 6 cigarettes every day.  The patient was found on previous CT scan of the chest to have concerning findings for disease recurrence and progression.  She had a PET scan performed recently and she is here for evaluation  and discussion of her risk her results and treatment options.  MEDICAL HISTORY: Past Medical History:  Diagnosis Date  . Cancer ([place])    Lung   . Carotid artery occlusion   . Clostridium difficile infection 06/2013  . Diabetes mellitus    takes Janumet daily  . Dyslipidemia    takes Crestor daily  . Gallstones   . GERD (gastroesophageal reflux disease)   . Heart murmur    hx of  . History of radiation therapy 06/11/17-06/21/17   right lung 50 Gy in 5 fractions  . Hypertension    takes Prinzide and Verapamil daily  . Impaired speech    from stroke  . Neuropathy, diabetic ([place])   . Pneumonia   . PONV (postoperative nausea and vomiting)   . Seizures ([place]) 04/19/2018   recent hospitalization  . Smoker   . Stroke ([place]) 06/25/13  . Vertigo    but doesn't take any meds    ALLERGIES:  is allergic to codeine; benadryl [diphenhydramine]; lipitor [atorvastatin]; and phenergan [promethazine hcl].  MEDICATIONS:  Current Outpatient Medications  Medication Sig Dispense Refill  . aspirin EC 81 MG tablet Take 81 mg by mouth daily.    . Blood Glucose Monitoring Suppl (ONE TOUCH ULTRA 2) w/Device KIT 1 Device by Does not apply route 2 (two) times daily. 1 each 0  . cholecalciferol (VITAMIN D3) 25 MCG (1000 UNIT) tablet Take 1,000 Units by mouth daily.    . divalproex (DEPAKOTE ER) 500 MG 24 hr tablet Take one tablet  twice daily 60 tablet 6  . glucose blood (ONE TOUCH ULTRA TEST) test strip Use as instructed  100 each 12  . Lancets (ONETOUCH ULTRASOFT) lancets Use as instructed 100 each 12  . lidocaine-prilocaine (EMLA) cream APPLY TO THE AFFECTED AREA DAILY AS NEEDED (Patient taking differently: Apply 1 application topically daily as needed (Pain). ) 30 g 0  . lisinopril (ZESTRIL) 10 MG tablet TAKE 1 TABLET(10 MG) BY MOUTH DAILY 90 tablet 3  . Multiple Vitamin (MULTIVITAMIN) tablet Take 1 tablet by mouth daily.    . Multiple Vitamins-Minerals (MULTIVITAMIN PO) Take 1 tablet by mouth  daily.    . pioglitazone (ACTOS) 30 MG tablet TAKE 1 TABLET(30 MG) BY MOUTH DAILY 90 tablet 1  . prochlorperazine (COMPAZINE) 10 MG tablet Take 1 tablet (10 mg total) by mouth every 6 (six) hours as needed for nausea or vomiting. (Patient not taking: Reported on 08/25/2019) 30 tablet 0  . rosuvastatin (CRESTOR) 20 MG tablet TAKE 1 TABLET BY MOUTH ONCE DAILY 180 tablet 0  . SitaGLIPtin-MetFORMIN HCl (JANUMET XR) 50-1000 MG TB24 Take 1 tablet by mouth daily. 60 tablet 5  . vitamin B-12 (CYANOCOBALAMIN) 1000 MCG tablet Take 1,000 mcg by mouth daily.     No current facility-administered medications for this visit.   Facility-Administered Medications Ordered in Other Visits  Medication Dose Route Frequency Provider Last Rate Last Admin  . heparin lock flush 100 unit/mL  500 Units Intracatheter Once PRN Curt Bears, MD      . sodium chloride flush (NS) 0.9 % injection 10 mL  10 mL Intracatheter PRN Curt Bears, MD        SURGICAL HISTORY:  Past Surgical History:  Procedure Laterality Date  . cataract removed Right   . CHOLECYSTECTOMY    . COLONOSCOPY    . ENDARTERECTOMY Left 07/14/2013   Procedure: ENDARTERECTOMY CAROTID-LEFT;  Surgeon: Elam Dutch, MD;  Location: [facility];  Service: Vascular;  Laterality: Left;  . EYE SURGERY Left    cataract  . IR CV LINE INJECTION  05/13/2019  . IR IMAGING GUIDED PORT INSERTION  09/15/2018  . IR PATIENT EVAL TECH 0-60 MINS  05/08/2019  . IR PERC PLEURAL DRAIN W/INDWELL CATH W/IMG GUIDE  05/23/2017  . KNEE SURGERY Left 75yr ago  . LAPAROSCOPIC PARTIAL COLECTOMY N/A 09/02/2015   Procedure: LAPAROSCOPIC PARTIAL RIGHT COLECTOMY;  Surgeon: ALeighton Ruff MD;  [facility];  Service: General;  Laterality: N/A;  . PATCH ANGIOPLASTY Left 07/14/2013   Procedure: LEFT CAROTID ARTERY PATCH ANGIOPLASTY;  Surgeon: CElam Dutch MD;  [place] [facility]  Service: Vascular;  Laterality: Left;  .Marland KitchenVIDEO BRONCHOSCOPY N/A 06/04/2018   Procedure: VIDEO  BRONCHOSCOPY;  Surgeon: HMelrose Nakayama MD;  [facility];  Service: Thoracic;  Laterality: N/A;  . wisdom      REVIEW OF SYSTEMS:  Constitutional: positive for fatigue Eyes: negative Ears, nose, mouth, throat, and face: negative Respiratory: positive for cough and dyspnea on exertion Cardiovascular: negative Gastrointestinal: negative Genitourinary:negative Integument/breast: negative Hematologic/lymphatic: negative Musculoskeletal:negative Neurological: negative Behavioral/Psych: negative Endocrine: negative Allergic/Immunologic: negative   PHYSICAL EXAMINATION: General appearance: alert, cooperative, fatigued and no distress Head: Normocephalic, without obvious abnormality, atraumatic Neck: no adenopathy, no JVD, supple, symmetrical, trachea midline and thyroid not enlarged, symmetric, no tenderness/mass/nodules Lymph nodes: Cervical, supraclavicular, and axillary nodes normal. Resp: diminished breath sounds RUL and dullness to percussion RUL Back: symmetric, no curvature. ROM normal. No CVA tenderness. Cardio: regular rate and rhythm, S1, S2 normal, no murmur, click, rub or gallop GI: soft, non-tender; bowel sounds normal; no masses,  no organomegaly Extremities: extremities normal, atraumatic, no  cyanosis or edema Neurologic: Alert and oriented X 3, normal strength and tone. Normal symmetric reflexes. Normal coordination and gait  ECOG PERFORMANCE STATUS: 1 - Symptomatic but completely ambulatory  Blood pressure (!) 151/61, pulse 90, temperature 98.5 F (36.9 C), temperature source Temporal, resp. rate 18, height '5\' 6"'  (1.676 m), weight 133 lb 3.2 oz (60.4 kg), SpO2 98 %.  LABORATORY DATA: Lab Results  Component Value Date   WBC 13.0 (H) 11/02/2019   HGB 11.1 (L) 11/02/2019   HCT 35.3 (L) 11/02/2019   MCV 87.8 11/02/2019   PLT 562 (H) 11/02/2019      Chemistry      Component Value Date/Time   NA 138 11/02/2019 1108   NA 139 05/07/2018 1147   K 4.1  11/02/2019 1108   CL 103 11/02/2019 1108   CO2 27 11/02/2019 1108   BUN 14 11/02/2019 1108   BUN 15 05/07/2018 1147   CREATININE 0.91 11/02/2019 1108   CREATININE 0.87 03/08/2015 0001      Component Value Date/Time   CALCIUM 9.8 11/02/2019 1108   ALKPHOS 77 11/02/2019 1108   AST 12 (L) 11/02/2019 1108   ALT 9 11/02/2019 1108   BILITOT 0.4 11/02/2019 1108       RADIOGRAPHIC STUDIES: DG Shoulder Right  Result Date: 10/29/2019 CLINICAL DATA:  Right shoulder pain for 3 months with no injury. EXAM: RIGHT SHOULDER - 2+ VIEW COMPARISON:  None. FINDINGS: There is no evidence of fracture or dislocation. Mild degenerative changes are seen involving the right acromioclavicular and glenohumeral joints. Soft tissues are unremarkable. IMPRESSION: Mild degenerative joint disease of the right acromioclavicular and glenohumeral joints. No acute abnormality seen in the right shoulder. Electronically Signed   By: Marijo Conception M.D.   On: 10/29/2019 16:25   CT Chest W Contrast  Result Date: 11/02/2019 CLINICAL DATA:  Non-small-cell lung cancer.  Restaging. EXAM: CT CHEST WITH CONTRAST TECHNIQUE: Multidetector CT imaging of the chest was performed during intravenous contrast administration. CONTRAST:  80m OMNIPAQUE IOHEXOL 300 MG/ML  SOLN COMPARISON:  09/07/2019 FINDINGS: Cardiovascular: Right Port-A-Cath tip at low SVC. Mild mass-effect upon the SVC, including on 53/2. Felt to be similar. Aortic and branch vessel atherosclerosis. Normal heart size, without pericardial effusion. Multivessel coronary artery atherosclerosis. No central pulmonary embolism, on this non-dedicated study. Mediastinum/Nodes: No supraclavicular adenopathy. A precarinal node measures 1.1 cm on 50/2 and demonstrates central necrosis. Compare 8 mm on the prior. No hilar adenopathy. Fluid level in the esophagus on 36/2. Lungs/Pleura: Small right pleural effusion, mildly decreased. Anterior loculation is again identified inferiorly.  Similar right upper lobe endobronchial obstruction. Right upper lobe heterogeneous lung mass measures on the order of 6.6 x 5.3 cm on 46/2. Compare maximally 7.0 x 4.8 cm on the prior exam. New or progressive mediastinal involvement including on 46/2. Persistent surrounding collapse/consolidation throughout the right upper lobe. Motion degradation in the lower chest. Central superior segment left lower lobe soft tissue density is at the site of soft tissue and ground-glass nodular densities on the prior exam. Example at 3.2 x 2.1 cm on 54/7. Upper Abdomen: Cholecystectomy. Motion degradation continuing into the upper abdomen. Adrenal thickening and nodularity are unchanged. Suspect cysts in the upper pole left kidney. Cholecystectomy. Musculoskeletal: Similar appearance of nonacute fracture of the anterior right second rib. IMPRESSION: 1. The central right upper lobe lung mass is relatively similar with surrounding right upper lobe collapse/consolidative change. 2. Disease progression, as evidenced by precarinal nodal metastasis and new or increased mediastinal involvement  by primary. 3. At the site of previously described left lower lobe ground-glass and soft tissue density nodules is confluent soft tissue density. Presuming the patient has not received radiation therapy to this area, considerations include progressive metastatic disease or a focus of infection. 4. Decrease in small right pleural effusion with persistent loculation. 5. Motion degradation. 6. Coronary artery atherosclerosis. Aortic Atherosclerosis (ICD10-I70.0). 7. Esophageal air fluid level suggests dysmotility or gastroesophageal reflux. Electronically Signed   By: Abigail Miyamoto M.D.   On: 11/02/2019 14:46   NM PET Image Restag (PS) Skull Base To Thigh  Result Date: 11/17/2019 CLINICAL DATA:  Subsequent treatment strategy for non-small cell lung cancer. Concern for recurrence. Chemotherapy completed 2 months prior. COVID vaccine LEFT arm. EXAM:  NUCLEAR MEDICINE PET SKULL BASE TO THIGH TECHNIQUE: 6.8 mCi F-18 FDG was injected intravenously. Full-ring PET imaging was performed from the skull base to thigh after the radiotracer. CT data was obtained and used for attenuation correction and anatomic localization. Fasting blood glucose: 156 mg/dl COMPARISON:  PET-CT 05/07/2018, chest CT 11/02/2019 FINDINGS: Mediastinal blood pool activity: SUV max 2.3 Liver activity: SUV max NA NECK: No hypermetabolic lymph nodes in the neck. Incidental CT findings: none CHEST: Hypermetabolic RIGHT upper lobe mass is again demonstrated measuring approximately 5.0 by 5.0 cm with intense peripheral metabolic activity (SUV max equal 17.6). The size and activity is similar to comparison PET-CT scan with a 5 cm mass measuring SUV max equal 11.9 on 05/07/2018. However, there is a new hypermetabolic precarinal lymph node in comparison to PET-CT scan. This lymph node measures 10 mm (image 62/5) with SUV max equal 7.3. Additionally there is hypermetabolic LEFT infrahilar tissue with SUV max equal 12.1. This infrahilar tissue measures 2.4 by 1.9 cm on image 71/4. This tissue is new from comparison PET-CT scan. Incidental CT findings: New RIGHT effusion compared to remote PET-CT scan but similar to most recent CT scan. ABDOMEN/PELVIS: No abnormal hypermetabolic activity within the liver, pancreas, adrenal glands, or spleen. No hypermetabolic lymph nodes in the abdomen or pelvis. Incidental CT findings: none SKELETON: No focal hypermetabolic activity to suggest skeletal metastasis. Incidental CT findings: none IMPRESSION: 1. Compared to PET-CT scan 05/07/2018, there is a new hypermetabolic precarinal metastatic lymph node and new hypermetabolic LEFT infrahilar thickened tissue consistent with metastatic disease. This new metastatic disease was characterized on recent diagnostic CT 11/02/2019. 2. Essentially stable RIGHT upper lobe hypermetabolic mass. 3. No evidence of distant metastatic  disease outside the thorax. 4. Moderate RIGHT pleural effusions similar prior. Electronically Signed   By: Suzy Bouchard M.D.   On: 11/17/2019 09:52    ASSESSMENT AND PLAN: This is a very pleasant 79 years old white female with recurrent non-small cell lung cancer presented as a stage IIIa involving the right lower lobe. The patient underwent a course of concurrent chemoradiation with weekly carboplatin and paclitaxel status post 7 cycles.  She tolerated this treatment well. The patient completed a course of consolidation immunotherapy with Imfinzi status post 26 cycles.  She tolerated her treatment well with no concerning complaints. The patient is currently on observation. The patient had repeat CT scan of the chest as well as a PET scan that showed evidence for disease recurrence in the right lung and metastasis to the left lung. I personally and independently reviewed the scan images and discussed the results with the patient today. I gave the patient the option of palliative care and hospice referral versus consideration of palliative systemic chemotherapy with carboplatin for AUC of  5, paclitaxel 175 NG/M2 and Keytruda 200 mg IV every 3 weeks with Neulasta support. The patient is interested in proceeding with treatment. She is expected to start the first cycle of this treatment next week. I discussed with the patient the adverse effect of this treatment including but not limited to alopecia, myelosuppression, nausea and vomiting, peripheral neuropathy, liver or renal dysfunction in addition to the immunotherapy adverse effects. She will come back for follow-up visit in 2 weeks for evaluation and management of any adverse effect of her treatment. The patient was advised to call immediately if she has any concerning symptoms in the interval. The patient voices understanding of current disease status and treatment options and is in agreement with the current care plan. All questions were  answered. The patient knows to call the clinic with any problems, questions or concerns. We can certainly see the patient much sooner if necessary.  Disclaimer: This note was dictated with voice recognition software. Similar sounding words can inadvertently be transcribed and may not be corrected upon review.

## 2019-11-20 ENCOUNTER — Telehealth: Payer: Self-pay | Admitting: Internal Medicine

## 2019-11-20 NOTE — Telephone Encounter (Signed)
Scheduled appt per 5/6 los - pt called in for appts. Pt daughter aware of appt date and time

## 2019-11-23 ENCOUNTER — Other Ambulatory Visit: Payer: Self-pay

## 2019-11-23 ENCOUNTER — Ambulatory Visit: Payer: Medicare Other | Admitting: Physical Therapy

## 2019-11-23 DIAGNOSIS — G8929 Other chronic pain: Secondary | ICD-10-CM | POA: Diagnosis not present

## 2019-11-23 DIAGNOSIS — R293 Abnormal posture: Secondary | ICD-10-CM | POA: Diagnosis not present

## 2019-11-23 DIAGNOSIS — M25511 Pain in right shoulder: Secondary | ICD-10-CM | POA: Diagnosis not present

## 2019-11-23 DIAGNOSIS — M6281 Muscle weakness (generalized): Secondary | ICD-10-CM | POA: Diagnosis not present

## 2019-11-23 DIAGNOSIS — M25611 Stiffness of right shoulder, not elsewhere classified: Secondary | ICD-10-CM | POA: Diagnosis not present

## 2019-11-23 NOTE — Therapy (Addendum)
[place], [place], [address] Phone: [phone]   Fax:  [phone]  Physical Therapy Treatment  Patient Details  Name: [name] MRN: [id number] Date of Birth: 09-Apr-1941 Referring Provider (PT): Leandrew Koyanagi, MD   Encounter Date: 11/23/2019  PT End of Session - 11/23/19 1526    Visit Number  2    Number of Visits  12    Date for PT Re-Evaluation  12/30/19    PT Start Time  1520    PT Stop Time  1605    PT Time Calculation (min)  45 min    Activity Tolerance  Patient tolerated treatment well    Behavior During Therapy  [facility] for tasks assessed/performed       Past Medical History:  Diagnosis Date  . Cancer ([place])    Lung   . Carotid artery occlusion   . Clostridium difficile infection 06/2013  . Diabetes mellitus    takes Janumet daily  . Dyslipidemia    takes Crestor daily  . Gallstones   . GERD (gastroesophageal reflux disease)   . Heart murmur    hx of  . History of radiation therapy 06/11/17-06/21/17   right lung 50 Gy in 5 fractions  . Hypertension    takes Prinzide and Verapamil daily  . Impaired speech    from stroke  . Neuropathy, diabetic ([place])   . Pneumonia   . PONV (postoperative nausea and vomiting)   . Seizures ([place]) 04/19/2018   recent hospitalization  . Smoker   . Stroke ([place]) 06/25/13  . Vertigo    but doesn't take any meds    Past Surgical History:  Procedure Laterality Date  . cataract removed Right   . CHOLECYSTECTOMY    . COLONOSCOPY    . ENDARTERECTOMY Left 07/14/2013   Procedure: ENDARTERECTOMY CAROTID-LEFT;  Surgeon: Elam Dutch, MD;  Location: [place];  Service: Vascular;  Laterality: Left;  . EYE SURGERY Left    cataract  . IR CV LINE INJECTION  05/13/2019  . IR IMAGING GUIDED PORT INSERTION  09/15/2018  . IR PATIENT EVAL TECH 0-60 MINS  05/08/2019  . IR PERC PLEURAL DRAIN W/INDWELL CATH W/IMG GUIDE  05/23/2017  . KNEE SURGERY Left 49yrs ago  . LAPAROSCOPIC  PARTIAL COLECTOMY N/A 09/02/2015   Procedure: LAPAROSCOPIC PARTIAL RIGHT COLECTOMY;  Surgeon: Leighton Ruff, MD;  [facility];  Service: General;  Laterality: N/A;  . PATCH ANGIOPLASTY Left 07/14/2013   Procedure: LEFT CAROTID ARTERY PATCH ANGIOPLASTY;  Surgeon: Elam Dutch, MD;  Location: [facility];  Service: Vascular;  Laterality: Left;  Marland Kitchen VIDEO BRONCHOSCOPY N/A 06/04/2018   Procedure: VIDEO BRONCHOSCOPY;  Surgeon: Melrose Nakayama, MD;  Location: [place] [facility];  Service: Thoracic;  Laterality: N/A;  . wisdom      There were no vitals filed for this visit.  Subjective Assessment - 11/23/19 1525    Subjective  Pt arriving to therapy today reporting 2/10 pain in her R shoulder.    Patient is accompained by:  Family member    Pertinent History  lung cancer, DM, HTN, neuropathy, seizures, stroke    Currently in Pain?  Yes    Pain Score  2     Pain Location  Shoulder    Pain Orientation  Right    Pain Descriptors / Indicators  Aching;Tightness    Pain Type  Chronic pain    Pain Onset  More than a month ago  [place] Adult PT Treatment/Exercise - 11/23/19 0001      Exercises   Exercises  Shoulder      Shoulder Exercises: Supine   External Rotation  AAROM;5 reps;Right   1# bar   Flexion  AAROM;5 reps   1# bar     Shoulder Exercises: Seated   Other Seated Exercises  table slides: flexion, ER, scaption    Other Seated Exercises  ball squeezes x 10 holding 5 seconds      Shoulder Exercises: Standing   Row  AROM;Strengthening;Both;10 reps;Theraband    Theraband Level (Shoulder Row)  Level 2 (Red)      Shoulder Exercises: Pulleys   Flexion  2 minutes    Scaption  2 minutes      Modalities   Modalities  Moist Heat      Moist Heat Therapy   Number Minutes Moist Heat  6 Minutes    Moist Heat Location  Shoulder                  PT Long Term Goals - 11/18/19 1604      PT LONG TERM GOAL #1   Title  independent with HEP     Status  New    Target Date  12/30/19      PT LONG TERM GOAL #2   Title  improve Rt shoulder flexion and abduction AROM by 20 degrees each direction for improved function    Status  New    Target Date  12/30/19      PT LONG TERM GOAL #3   Title  report pain < 3/10 with shoulder reaching tasks for improved function    Status  New    Target Date  12/30/19      PT LONG TERM GOAL #4   Title  report 50% improvement in ADLs for improved function    Status  New    Target Date  12/30/19        Plan: Pt arriving to therapy mild pain in R shoulder.Pt reporting compliance in her HEP.  Pt tolerating exericses well with concentration on ROM and postural control. Continue with skilled PT as pt tolerates.      Patient will benefit from skilled therapeutic intervention in order to improve the following deficits and impairments:     Visit Diagnosis: Chronic right shoulder pain  Stiffness of right shoulder, not elsewhere classified  Abnormal posture  Muscle weakness (generalized)     Problem List Patient Active Problem List   Diagnosis Date Noted  . Stage IV squamous cell carcinoma of right lung ([place]) 11/19/2019  . Chronic right shoulder pain 11/10/2019  . Port-A-Cath in place 10/21/2018  . Stage III squamous cell carcinoma of right lung ([place]) 06/09/2018  . Encounter for antineoplastic chemotherapy 06/09/2018  . Encounter for antineoplastic immunotherapy 06/09/2018  . Goals of care, counseling/discussion 05/16/2018  . Partial seizure ([place]) 04/21/2018  . TIA (transient ischemic attack) 04/09/2018  . Malnutrition of moderate degree 03/01/2018  . Non-small cell lung cancer ([place]) 05/28/2017  . Migraine equivalent 04/15/2017  . Hyperlipidemia associated with type 2 diabetes mellitus ([place]) 03/07/2016  . Adenomatous colon polyp s/p laparoscopic right colectomy 09/02/15 09/02/2015  . Hemispheric carotid artery syndrome 04/07/2015  . Cataract associated with type 2 diabetes mellitus  ([place]) 03/08/2015  . S/P recent Left carotid endarterectomy 07/14/2013  . History of CVA (cerebrovascular accident) 06/27/2013  . Hypertension associated with diabetes ([place]) 01/04/2011  . DM type 2 with diabetic dyslipidemia ([place]) 01/04/2011  .  Light smoker 01/04/2011    Oretha Caprice, PT, MPT 11/23/2019, 4:11 PM  [facility] [address] [place], [place], [address] Phone: [phone]   Fax:  [phone]  Name: [name] MRN: [id number] Date of Birth: Sep 21, 1940

## 2019-11-24 NOTE — Progress Notes (Signed)
Pharmacist Chemotherapy Monitoring - Initial Assessment    Anticipated start date: 11/25/19   Regimen:  . Are orders appropriate based on the patient's diagnosis, regimen, and cycle? Yes . Does the plan date match the patient's scheduled date? Yes . Is the sequencing of drugs appropriate? Yes . Are the premedications appropriate for the patient's regimen? Yes . Prior Authorization for treatment is: Approved o If applicable, is the correct biosimilar selected based on the patient's insurance? yes  Organ Function and Labs: Marland Kitchen Are dose adjustments needed based on the patient's renal function, hepatic function, or hematologic function? No . Are appropriate labs ordered prior to the start of patient's treatment? Yes . Other organ system assessment, if indicated: N/A . The following baseline labs, if indicated, have been ordered: pembrolizumab: baseline TSH +/- T4  Dose Assessment: . Are the drug doses appropriate? Yes . Are the following correct: o Drug concentrations Yes o IV fluid compatible with drug Yes o Administration routes Yes o Timing of therapy Yes . If applicable, does the patient have documented access for treatment and/or plans for port-a-cath placement? not applicable . If applicable, have lifetime cumulative doses been properly documented and assessed? yes Lifetime Dose Tracking  . Carboplatin: 980 mg = 0.01 % of the maximum lifetime dose of 999,999,999 mg  o   Toxicity Monitoring/Prevention: . The patient has the following take home antiemetics prescribed: Prochlorperazine . The patient has the following take home medications prescribed: N/A . Medication allergies and previous infusion related reactions, if applicable, have been reviewed and addressed. Yes - will address benadryl dose w/ MD. Patient was getting 25mg  with previous treatments.  . The patient's current medication list has been assessed for drug-drug interactions with their chemotherapy regimen. no significant  drug-drug interactions were identified on review.  Order Review: . Are the treatment plan orders signed? Yes . Is the patient scheduled to see a provider prior to their treatment? No  I verify that I have reviewed each item in the above checklist and answered each question accordingly.  [facility] [place] 11/24/2019 11:36 AM

## 2019-11-25 ENCOUNTER — Other Ambulatory Visit: Payer: Self-pay

## 2019-11-25 ENCOUNTER — Inpatient Hospital Stay: Payer: Medicare Other

## 2019-11-25 VITALS — BP 140/61 | HR 75 | Temp 98.0°F | Resp 18

## 2019-11-25 DIAGNOSIS — Z5189 Encounter for other specified aftercare: Secondary | ICD-10-CM | POA: Diagnosis not present

## 2019-11-25 DIAGNOSIS — Z7984 Long term (current) use of oral hypoglycemic drugs: Secondary | ICD-10-CM | POA: Diagnosis not present

## 2019-11-25 DIAGNOSIS — E785 Hyperlipidemia, unspecified: Secondary | ICD-10-CM | POA: Diagnosis not present

## 2019-11-25 DIAGNOSIS — C3491 Malignant neoplasm of unspecified part of right bronchus or lung: Secondary | ICD-10-CM

## 2019-11-25 DIAGNOSIS — Z5111 Encounter for antineoplastic chemotherapy: Secondary | ICD-10-CM | POA: Diagnosis not present

## 2019-11-25 DIAGNOSIS — Z95828 Presence of other vascular implants and grafts: Secondary | ICD-10-CM

## 2019-11-25 DIAGNOSIS — E1140 Type 2 diabetes mellitus with diabetic neuropathy, unspecified: Secondary | ICD-10-CM | POA: Diagnosis not present

## 2019-11-25 DIAGNOSIS — C7802 Secondary malignant neoplasm of left lung: Secondary | ICD-10-CM | POA: Diagnosis not present

## 2019-11-25 DIAGNOSIS — Z79899 Other long term (current) drug therapy: Secondary | ICD-10-CM | POA: Diagnosis not present

## 2019-11-25 DIAGNOSIS — Z923 Personal history of irradiation: Secondary | ICD-10-CM | POA: Diagnosis not present

## 2019-11-25 DIAGNOSIS — Z9221 Personal history of antineoplastic chemotherapy: Secondary | ICD-10-CM | POA: Diagnosis not present

## 2019-11-25 DIAGNOSIS — Z7982 Long term (current) use of aspirin: Secondary | ICD-10-CM | POA: Diagnosis not present

## 2019-11-25 DIAGNOSIS — Z8673 Personal history of transient ischemic attack (TIA), and cerebral infarction without residual deficits: Secondary | ICD-10-CM | POA: Diagnosis not present

## 2019-11-25 DIAGNOSIS — F17210 Nicotine dependence, cigarettes, uncomplicated: Secondary | ICD-10-CM | POA: Diagnosis not present

## 2019-11-25 DIAGNOSIS — C3411 Malignant neoplasm of upper lobe, right bronchus or lung: Secondary | ICD-10-CM | POA: Diagnosis not present

## 2019-11-25 DIAGNOSIS — Z5112 Encounter for antineoplastic immunotherapy: Secondary | ICD-10-CM | POA: Diagnosis not present

## 2019-11-25 DIAGNOSIS — I10 Essential (primary) hypertension: Secondary | ICD-10-CM | POA: Diagnosis not present

## 2019-11-25 LAB — CMP (CANCER CENTER ONLY)
ALT: 7 U/L (ref 0–44)
AST: 11 U/L — ABNORMAL LOW (ref 15–41)
Albumin: 2.8 g/dL — ABNORMAL LOW (ref 3.5–5.0)
Alkaline Phosphatase: 78 U/L (ref 38–126)
Anion gap: 13 (ref 5–15)
BUN: 14 mg/dL (ref 8–23)
CO2: 26 mmol/L (ref 22–32)
Calcium: 9.8 mg/dL (ref 8.9–10.3)
Chloride: 103 mmol/L (ref 98–111)
Creatinine: 0.86 mg/dL (ref 0.44–1.00)
GFR, Est AFR Am: >60 mL/min (ref >60)
GFR, Estimated: >60 mL/min (ref >60)
Glucose, Bld: 171 mg/dL — ABNORMAL HIGH (ref 70–99)
Potassium: 3.8 mmol/L (ref 3.5–5.1)
Sodium: 142 mmol/L (ref 135–145)
Total Bilirubin: 0.3 mg/dL (ref 0.3–1.2)
Total Protein: 6.8 g/dL (ref 6.5–8.1)

## 2019-11-25 LAB — CBC WITH DIFFERENTIAL (CANCER CENTER ONLY)
Abs Immature Granulocytes: 0.06 10*3/uL (ref 0.00–0.07)
Basophils Absolute: 0.1 10*3/uL (ref 0.0–0.1)
Basophils Relative: 0 %
Eosinophils Absolute: 0.4 10*3/uL (ref 0.0–0.5)
Eosinophils Relative: 3 %
HCT: 34.5 % — ABNORMAL LOW (ref 36.0–46.0)
Hemoglobin: 10.9 g/dL — ABNORMAL LOW (ref 12.0–15.0)
Immature Granulocytes: 0 %
Lymphocytes Relative: 8 %
Lymphs Abs: 1.2 10*3/uL (ref 0.7–4.0)
MCH: 27.6 pg (ref 26.0–34.0)
MCHC: 31.6 g/dL (ref 30.0–36.0)
MCV: 87.3 fL (ref 80.0–100.0)
Monocytes Absolute: 0.9 10*3/uL (ref 0.1–1.0)
Monocytes Relative: 6 %
Neutro Abs: 11.6 10*3/uL — ABNORMAL HIGH (ref 1.7–7.7)
Neutrophils Relative %: 83 %
Platelet Count: 606 10*3/uL — ABNORMAL HIGH (ref 150–400)
RBC: 3.95 MIL/uL (ref 3.87–5.11)
RDW: 15.8 % — ABNORMAL HIGH (ref 11.5–15.5)
WBC Count: 14.2 10*3/uL — ABNORMAL HIGH (ref 4.0–10.5)
nRBC: 0 % (ref 0.0–0.2)

## 2019-11-25 LAB — TSH: TSH: 2.417 u[IU]/mL (ref 0.308–3.960)

## 2019-11-25 MED ORDER — HEPARIN SOD (PORK) LOCK FLUSH 100 UNIT/ML IV SOLN
500.0000 [IU] | Freq: Once | INTRAVENOUS | Status: AC | PRN
  Administered 2019-11-25: 500 [IU]
  Filled 2019-11-25: qty 5

## 2019-11-25 MED ORDER — SODIUM CHLORIDE 0.9 % IV SOLN
Freq: Once | INTRAVENOUS | Status: AC
  Filled 2019-11-25: qty 250

## 2019-11-25 MED ORDER — DIPHENHYDRAMINE HCL 50 MG/ML IJ SOLN
25.0000 mg | Freq: Once | INTRAMUSCULAR | Status: AC
  Administered 2019-11-25: 25 mg via INTRAVENOUS

## 2019-11-25 MED ORDER — SODIUM CHLORIDE 0.9 % IV SOLN
346.0000 mg | Freq: Once | INTRAVENOUS | Status: AC
  Administered 2019-11-25: 350 mg via INTRAVENOUS
  Filled 2019-11-25: qty 35

## 2019-11-25 MED ORDER — DIPHENHYDRAMINE HCL 50 MG/ML IJ SOLN
INTRAMUSCULAR | Status: AC
  Filled 2019-11-25: qty 1

## 2019-11-25 MED ORDER — SODIUM CHLORIDE 0.9 % IV SOLN
10.0000 mg | Freq: Once | INTRAVENOUS | Status: AC
  Administered 2019-11-25: 10 mg via INTRAVENOUS
  Filled 2019-11-25: qty 10

## 2019-11-25 MED ORDER — PALONOSETRON HCL INJECTION 0.25 MG/5ML
INTRAVENOUS | Status: AC
  Filled 2019-11-25: qty 5

## 2019-11-25 MED ORDER — SODIUM CHLORIDE 0.9% FLUSH
10.0000 mL | INTRAVENOUS | Status: DC | PRN
  Administered 2019-11-25: 10 mL
  Filled 2019-11-25: qty 10

## 2019-11-25 MED ORDER — FAMOTIDINE IN NACL 20-0.9 MG/50ML-% IV SOLN
INTRAVENOUS | Status: AC
  Filled 2019-11-25: qty 50

## 2019-11-25 MED ORDER — SODIUM CHLORIDE 0.9 % IV SOLN
175.0000 mg/m2 | Freq: Once | INTRAVENOUS | Status: AC
  Administered 2019-11-25: 294 mg via INTRAVENOUS
  Filled 2019-11-25: qty 49

## 2019-11-25 MED ORDER — FAMOTIDINE IN NACL 20-0.9 MG/50ML-% IV SOLN
20.0000 mg | Freq: Once | INTRAVENOUS | Status: AC
  Administered 2019-11-25: 20 mg via INTRAVENOUS

## 2019-11-25 MED ORDER — SODIUM CHLORIDE 0.9 % IV SOLN
150.0000 mg | Freq: Once | INTRAVENOUS | Status: AC
  Administered 2019-11-25: 150 mg via INTRAVENOUS
  Filled 2019-11-25: qty 150

## 2019-11-25 MED ORDER — PALONOSETRON HCL INJECTION 0.25 MG/5ML
0.2500 mg | Freq: Once | INTRAVENOUS | Status: AC
  Administered 2019-11-25: 0.25 mg via INTRAVENOUS

## 2019-11-25 MED ORDER — SODIUM CHLORIDE 0.9 % IV SOLN
200.0000 mg | Freq: Once | INTRAVENOUS | Status: AC
  Administered 2019-11-25: 200 mg via INTRAVENOUS
  Filled 2019-11-25: qty 8

## 2019-11-25 NOTE — Patient Instructions (Signed)
[facility] Discharge Instructions for Patients Receiving Chemotherapy  Today you received the following chemotherapy agents: Keytruda, Taxol, Carboplatin  To help prevent nausea and vomiting after your treatment, we encourage you to take your nausea medication as prescribed by your physician.   If you develop nausea and vomiting that is not controlled by your nausea medication, call the clinic.   BELOW ARE SYMPTOMS THAT SHOULD BE REPORTED IMMEDIATELY:  *FEVER GREATER THAN 100.5 F  *CHILLS WITH OR WITHOUT FEVER  NAUSEA AND VOMITING THAT IS NOT CONTROLLED WITH YOUR NAUSEA MEDICATION  *UNUSUAL SHORTNESS OF BREATH  *UNUSUAL BRUISING OR BLEEDING  TENDERNESS IN MOUTH AND THROAT WITH OR WITHOUT PRESENCE OF ULCERS  *URINARY PROBLEMS  *BOWEL PROBLEMS  UNUSUAL RASH Items with * indicate a potential emergency and should be followed up as soon as possible.  Feel free to call the clinic should you have any questions or concerns. The clinic phone number is [phone].  Please show the [facility] at check-in to the Emergency Department and triage nurse.  Pembrolizumab injection What is this medicine? PEMBROLIZUMAB (pem broe liz ue mab) is a monoclonal antibody. It is used to treat certain types of cancer. This medicine may be used for other purposes; ask your health care provider or pharmacist if you have questions. COMMON BRAND NAME(S): Keytruda What should I tell my health care provider before I take this medicine? They need to know if you have any of these conditions:  diabetes  immune system problems  inflammatory bowel disease  liver disease  lung or breathing disease  lupus  received or scheduled to receive an organ transplant or a stem-cell transplant that uses donor stem cells  an unusual or allergic reaction to pembrolizumab, other medicines, foods, dyes, or preservatives  pregnant or trying to get pregnant  breast-feeding How should I  use this medicine? This medicine is for infusion into a vein. It is given by a health care professional in a hospital or clinic setting. A special MedGuide will be given to you before each treatment. Be sure to read this information carefully each time. Talk to your pediatrician regarding the use of this medicine in children. While this drug may be prescribed for children as young as 6 months for selected conditions, precautions do apply. Overdosage: If you think you have taken too much of this medicine contact a poison control center or emergency room at once. NOTE: This medicine is only for you. Do not share this medicine with others. What if I miss a dose? It is important not to miss your dose. Call your doctor or health care professional if you are unable to keep an appointment. What may interact with this medicine? Interactions have not been studied. Give your health care provider a list of all the medicines, herbs, non-prescription drugs, or dietary supplements you use. Also tell them if you smoke, drink alcohol, or use illegal drugs. Some items may interact with your medicine. This list may not describe all possible interactions. Give your health care provider a list of all the medicines, herbs, non-prescription drugs, or dietary supplements you use. Also tell them if you smoke, drink alcohol, or use illegal drugs. Some items may interact with your medicine. What should I watch for while using this medicine? Your condition will be monitored carefully while you are receiving this medicine. You may need blood work done while you are taking this medicine. Do not become pregnant while taking this medicine or for 4 months  after stopping it. Women should inform their doctor if they wish to become pregnant or think they might be pregnant. There is a potential for serious side effects to an unborn child. Talk to your health care professional or pharmacist for more information. Do not breast-feed an  infant while taking this medicine or for 4 months after the last dose. What side effects may I notice from receiving this medicine? Side effects that you should report to your doctor or health care professional as soon as possible:  allergic reactions like skin rash, itching or hives, swelling of the face, lips, or tongue  bloody or black, tarry  breathing problems  changes in vision  chest pain  chills  confusion  constipation  cough  diarrhea  dizziness or feeling faint or lightheaded  fast or irregular heartbeat  fever  flushing  joint pain  low blood counts - this medicine may decrease the number of white blood cells, red blood cells and platelets. You may be at increased risk for infections and bleeding.  muscle pain  muscle weakness  pain, tingling, numbness in the hands or feet  persistent headache  redness, blistering, peeling or loosening of the skin, including inside the mouth  signs and symptoms of high blood sugar such as dizziness; dry mouth; dry skin; fruity breath; nausea; stomach pain; increased hunger or thirst; increased urination  signs and symptoms of kidney injury like trouble passing urine or change in the amount of urine  signs and symptoms of liver injury like dark urine, light-colored stools, loss of appetite, nausea, right upper belly pain, yellowing of the eyes or skin  sweating  swollen lymph nodes  weight loss Side effects that usually do not require medical attention (report to your doctor or health care professional if they continue or are bothersome):  decreased appetite  hair loss  muscle pain  tiredness This list may not describe all possible side effects. Call your doctor for medical advice about side effects. You may report side effects to FDA at 1-800-FDA-1088. Where should I keep my medicine? This drug is given in a hospital or clinic and will not be stored at home. NOTE: This sheet is a summary. It may not  cover all possible information. If you have questions about this medicine, talk to your doctor, pharmacist, or health care provider.  2020 Elsevier/Gold Standard (2019-05-08 18:07:58)  Paclitaxel injection What is this medicine? PACLITAXEL (PAK li TAX el) is a chemotherapy drug. It targets fast dividing cells, like cancer cells, and causes these cells to die. This medicine is used to treat ovarian cancer, breast cancer, lung cancer, Kaposi's sarcoma, and other cancers. This medicine may be used for other purposes; ask your health care provider or pharmacist if you have questions. COMMON BRAND NAME(S): Onxol, Taxol What should I tell my health care provider before I take this medicine? They need to know if you have any of these conditions:  history of irregular heartbeat  liver disease  low blood counts, like low white cell, platelet, or red cell counts  lung or breathing disease, like asthma  tingling of the fingers or toes, or other nerve disorder  an unusual or allergic reaction to paclitaxel, alcohol, polyoxyethylated castor oil, other chemotherapy, other medicines, foods, dyes, or preservatives  pregnant or trying to get pregnant  breast-feeding How should I use this medicine? This drug is given as an infusion into a vein. It is administered in a hospital or clinic by a specially trained health care  professional. Talk to your pediatrician regarding the use of this medicine in children. Special care may be needed. Overdosage: If you think you have taken too much of this medicine contact a poison control center or emergency room at once. NOTE: This medicine is only for you. Do not share this medicine with others. What if I miss a dose? It is important not to miss your dose. Call your doctor or health care professional if you are unable to keep an appointment. What may interact with this medicine? Do not take this medicine with any of the following  medications:  disulfiram  metronidazole This medicine may also interact with the following medications:  antiviral medicines for hepatitis, HIV or AIDS  certain antibiotics like erythromycin and clarithromycin  certain medicines for fungal infections like ketoconazole and itraconazole  certain medicines for seizures like carbamazepine, phenobarbital, phenytoin  gemfibrozil  nefazodone  rifampin  St. John's wort This list may not describe all possible interactions. Give your health care provider a list of all the medicines, herbs, non-prescription drugs, or dietary supplements you use. Also tell them if you smoke, drink alcohol, or use illegal drugs. Some items may interact with your medicine. What should I watch for while using this medicine? Your condition will be monitored carefully while you are receiving this medicine. You will need important blood work done while you are taking this medicine. This medicine can cause serious allergic reactions. To reduce your risk you will need to take other medicine(s) before treatment with this medicine. If you experience allergic reactions like skin rash, itching or hives, swelling of the face, lips, or tongue, tell your doctor or health care professional right away. In some cases, you may be given additional medicines to help with side effects. Follow all directions for their use. This drug may make you feel generally unwell. This is not uncommon, as chemotherapy can affect healthy cells as well as cancer cells. Report any side effects. Continue your course of treatment even though you feel ill unless your doctor tells you to stop. Call your doctor or health care professional for advice if you get a fever, chills or sore throat, or other symptoms of a cold or flu. Do not treat yourself. This drug decreases your body's ability to fight infections. Try to avoid being around people who are sick. This medicine may increase your risk to bruise or  bleed. Call your doctor or health care professional if you notice any unusual bleeding. Be careful brushing and flossing your teeth or using a toothpick because you may get an infection or bleed more easily. If you have any dental work done, tell your dentist you are receiving this medicine. Avoid taking products that contain aspirin, acetaminophen, ibuprofen, naproxen, or ketoprofen unless instructed by your doctor. These medicines may hide a fever. Do not become pregnant while taking this medicine. Women should inform their doctor if they wish to become pregnant or think they might be pregnant. There is a potential for serious side effects to an unborn child. Talk to your health care professional or pharmacist for more information. Do not breast-feed an infant while taking this medicine. Men are advised not to father a child while receiving this medicine. This product may contain alcohol. Ask your pharmacist or healthcare provider if this medicine contains alcohol. Be sure to tell all healthcare providers you are taking this medicine. Certain medicines, like metronidazole and disulfiram, can cause an unpleasant reaction when taken with alcohol. The reaction includes flushing, headache, nausea,  vomiting, sweating, and increased thirst. The reaction can last from 30 minutes to several hours. What side effects may I notice from receiving this medicine? Side effects that you should report to your doctor or health care professional as soon as possible:  allergic reactions like skin rash, itching or hives, swelling of the face, lips, or tongue  breathing problems  changes in vision  fast, irregular heartbeat  high or low blood pressure  mouth sores  pain, tingling, numbness in the hands or feet  signs of decreased platelets or bleeding - bruising, pinpoint red spots on the skin, black, tarry stools, blood in the urine  signs of decreased red blood cells - unusually weak or tired, feeling faint  or lightheaded, falls  signs of infection - fever or chills, cough, sore throat, pain or difficulty passing urine  signs and symptoms of liver injury like dark yellow or brown urine; general ill feeling or flu-like symptoms; light-colored stools; loss of appetite; nausea; right upper belly pain; unusually weak or tired; yellowing of the eyes or skin  swelling of the ankles, feet, hands  unusually slow heartbeat Side effects that usually do not require medical attention (report to your doctor or health care professional if they continue or are bothersome):  diarrhea  hair loss  loss of appetite  muscle or joint pain  nausea, vomiting  pain, redness, or irritation at site where injected  tiredness This list may not describe all possible side effects. Call your doctor for medical advice about side effects. You may report side effects to FDA at 1-800-FDA-1088. Where should I keep my medicine? This drug is given in a hospital or clinic and will not be stored at home. NOTE: This sheet is a summary. It may not cover all possible information. If you have questions about this medicine, talk to your doctor, pharmacist, or health care provider.  2020 Elsevier/Gold Standard (2017-03-05 13:14:55)  Carboplatin injection What is this medicine? CARBOPLATIN (KAR boe pla tin) is a chemotherapy drug. It targets fast dividing cells, like cancer cells, and causes these cells to die. This medicine is used to treat ovarian cancer and many other cancers. This medicine may be used for other purposes; ask your health care provider or pharmacist if you have questions. COMMON BRAND NAME(S): Paraplatin What should I tell my health care provider before I take this medicine? They need to know if you have any of these conditions:  blood disorders  hearing problems  kidney disease  recent or ongoing radiation therapy  an unusual or allergic reaction to carboplatin, cisplatin, other chemotherapy, other  medicines, foods, dyes, or preservatives  pregnant or trying to get pregnant  breast-feeding How should I use this medicine? This drug is usually given as an infusion into a vein. It is administered in a hospital or clinic by a specially trained health care professional. Talk to your pediatrician regarding the use of this medicine in children. Special care may be needed. Overdosage: If you think you have taken too much of this medicine contact a poison control center or emergency room at once. NOTE: This medicine is only for you. Do not share this medicine with others. What if I miss a dose? It is important not to miss a dose. Call your doctor or health care professional if you are unable to keep an appointment. What may interact with this medicine?  medicines for seizures  medicines to increase blood counts like filgrastim, pegfilgrastim, sargramostim  some antibiotics like amikacin, gentamicin, neomycin,  streptomycin, tobramycin  vaccines Talk to your doctor or health care professional before taking any of these medicines:  acetaminophen  aspirin  ibuprofen  ketoprofen  naproxen This list may not describe all possible interactions. Give your health care provider a list of all the medicines, herbs, non-prescription drugs, or dietary supplements you use. Also tell them if you smoke, drink alcohol, or use illegal drugs. Some items may interact with your medicine. What should I watch for while using this medicine? Your condition will be monitored carefully while you are receiving this medicine. You will need important blood work done while you are taking this medicine. This drug may make you feel generally unwell. This is not uncommon, as chemotherapy can affect healthy cells as well as cancer cells. Report any side effects. Continue your course of treatment even though you feel ill unless your doctor tells you to stop. In some cases, you may be given additional medicines to help  with side effects. Follow all directions for their use. Call your doctor or health care professional for advice if you get a fever, chills or sore throat, or other symptoms of a cold or flu. Do not treat yourself. This drug decreases your body's ability to fight infections. Try to avoid being around people who are sick. This medicine may increase your risk to bruise or bleed. Call your doctor or health care professional if you notice any unusual bleeding. Be careful brushing and flossing your teeth or using a toothpick because you may get an infection or bleed more easily. If you have any dental work done, tell your dentist you are receiving this medicine. Avoid taking products that contain aspirin, acetaminophen, ibuprofen, naproxen, or ketoprofen unless instructed by your doctor. These medicines may hide a fever. Do not become pregnant while taking this medicine. Women should inform their doctor if they wish to become pregnant or think they might be pregnant. There is a potential for serious side effects to an unborn child. Talk to your health care professional or pharmacist for more information. Do not breast-feed an infant while taking this medicine. What side effects may I notice from receiving this medicine? Side effects that you should report to your doctor or health care professional as soon as possible:  allergic reactions like skin rash, itching or hives, swelling of the face, lips, or tongue  signs of infection - fever or chills, cough, sore throat, pain or difficulty passing urine  signs of decreased platelets or bleeding - bruising, pinpoint red spots on the skin, black, tarry stools, nosebleeds  signs of decreased red blood cells - unusually weak or tired, fainting spells, lightheadedness  breathing problems  changes in hearing  changes in vision  chest pain  high blood pressure  low blood counts - This drug may decrease the number of white blood cells, red blood cells and  platelets. You may be at increased risk for infections and bleeding.  nausea and vomiting  pain, swelling, redness or irritation at the injection site  pain, tingling, numbness in the hands or feet  problems with balance, talking, walking  trouble passing urine or change in the amount of urine Side effects that usually do not require medical attention (report to your doctor or health care professional if they continue or are bothersome):  hair loss  loss of appetite  metallic taste in the mouth or changes in taste This list may not describe all possible side effects. Call your doctor for medical advice about side effects. You  may report side effects to FDA at 1-800-FDA-1088. Where should I keep my medicine? This drug is given in a hospital or clinic and will not be stored at home. NOTE: This sheet is a summary. It may not cover all possible information. If you have questions about this medicine, talk to your doctor, pharmacist, or health care provider.  2020 Elsevier/Gold Standard (2007-10-07 14:38:05)

## 2019-11-25 NOTE — Progress Notes (Signed)
1052: Obtained post keytruda/pre-taxol infusion vital signs. Patient's BP had dropped from her initial BP reading when she came to infusion (See VS flow sheet). Patient denied dizziness, and is AO x4.  Sandi Mealy, PA notified. Verbal order received to give 250 mL NS bolus over 15 minutes. BP recheck after bolus completion was 106/44. Sandi Mealy, PA notified, ok given to proceed with paclitaxel. Will continue to monitor patient.

## 2019-11-27 ENCOUNTER — Other Ambulatory Visit: Payer: Self-pay

## 2019-11-27 ENCOUNTER — Inpatient Hospital Stay: Payer: Medicare Other

## 2019-11-27 VITALS — BP 128/72 | HR 72 | Temp 98.2°F | Resp 18

## 2019-11-27 DIAGNOSIS — C3411 Malignant neoplasm of upper lobe, right bronchus or lung: Secondary | ICD-10-CM | POA: Diagnosis not present

## 2019-11-27 DIAGNOSIS — Z8673 Personal history of transient ischemic attack (TIA), and cerebral infarction without residual deficits: Secondary | ICD-10-CM | POA: Diagnosis not present

## 2019-11-27 DIAGNOSIS — E1140 Type 2 diabetes mellitus with diabetic neuropathy, unspecified: Secondary | ICD-10-CM | POA: Diagnosis not present

## 2019-11-27 DIAGNOSIS — C7802 Secondary malignant neoplasm of left lung: Secondary | ICD-10-CM | POA: Diagnosis not present

## 2019-11-27 DIAGNOSIS — Z7982 Long term (current) use of aspirin: Secondary | ICD-10-CM | POA: Diagnosis not present

## 2019-11-27 DIAGNOSIS — I10 Essential (primary) hypertension: Secondary | ICD-10-CM | POA: Diagnosis not present

## 2019-11-27 DIAGNOSIS — Z5112 Encounter for antineoplastic immunotherapy: Secondary | ICD-10-CM | POA: Diagnosis not present

## 2019-11-27 DIAGNOSIS — E785 Hyperlipidemia, unspecified: Secondary | ICD-10-CM | POA: Diagnosis not present

## 2019-11-27 DIAGNOSIS — Z9221 Personal history of antineoplastic chemotherapy: Secondary | ICD-10-CM | POA: Diagnosis not present

## 2019-11-27 DIAGNOSIS — Z7984 Long term (current) use of oral hypoglycemic drugs: Secondary | ICD-10-CM | POA: Diagnosis not present

## 2019-11-27 DIAGNOSIS — Z923 Personal history of irradiation: Secondary | ICD-10-CM | POA: Diagnosis not present

## 2019-11-27 DIAGNOSIS — Z5111 Encounter for antineoplastic chemotherapy: Secondary | ICD-10-CM | POA: Diagnosis not present

## 2019-11-27 DIAGNOSIS — Z79899 Other long term (current) drug therapy: Secondary | ICD-10-CM | POA: Diagnosis not present

## 2019-11-27 DIAGNOSIS — Z5189 Encounter for other specified aftercare: Secondary | ICD-10-CM | POA: Diagnosis not present

## 2019-11-27 DIAGNOSIS — C3491 Malignant neoplasm of unspecified part of right bronchus or lung: Secondary | ICD-10-CM

## 2019-11-27 MED ORDER — PEGFILGRASTIM-CBQV 6 MG/0.6ML ~~LOC~~ SOSY
6.0000 mg | PREFILLED_SYRINGE | Freq: Once | SUBCUTANEOUS | Status: AC
  Administered 2019-11-27: 6 mg via SUBCUTANEOUS

## 2019-11-27 MED ORDER — PEGFILGRASTIM-CBQV 6 MG/0.6ML ~~LOC~~ SOSY
PREFILLED_SYRINGE | SUBCUTANEOUS | Status: AC
  Filled 2019-11-27: qty 0.6

## 2019-11-27 NOTE — Patient Instructions (Signed)

## 2019-11-30 ENCOUNTER — Telehealth: Payer: Self-pay | Admitting: *Deleted

## 2019-11-30 NOTE — Progress Notes (Signed)
[facility] OFFICE PROGRESS NOTE  [name], [place] [place] [address]  DIAGNOSIS: Recurrent non-small cell lung cancer likely squamous cell carcinoma that was initially diagnosed as a stage IIA(T2b, N0, M0) inAugust 2018status post curative stereotactic radiotherapy completed June 21, 2017. The patient presented today with concerning findings for disease recurrence and progression with enlarging and hypermetabolic right upper lobe lung mass presenting as stage IIIa (T3, N0, M0).  PDL 1 expression 0%.  PRIOR THERAPY: 1) Curative stereotactic radiotherapy completed on June 21, 2017. 2) A course of concurrent chemoradiation with chemotherapy consisting of carboplatin for an AUC of 2 and paclitaxel 45 mg/m.First dose started on 06/23/2018.Status post 7 cycles. Last dose of chemotherapy was given August 04, 2018. 3)SBRT to the enlarging left lower lobe pulmonary nodule under the care of Dr. Sondra Come. Last radiation treatment scheduled for 03/26/2019 4) Consolidation immunotherapy with Imfinzi 10 mg/KG every 2 weeks. Last dose given on 09/07/19. Status post26cycles.  CURRENT THERAPY: Systemic chemotherapy with carboplatin for an AUC of 5, Taxol 175 mg/m2, and Keytruda 200 mg IV every 3 weeks with Neulasta. Status post 1 cycle. First dose received on 11/25/19  INTERVAL HISTORY: [name] 79 y.o. female returns to the clinic for a follow up visit. The patient is feeling "so-so" today. She had a tough time with her first treatment. The patient recently showed evidence of disease progression. Her treatment was switched to systemic chemotherapy and immunotherapy, her first dose was given last week. She had generalized feeling of being unwell with decreased energy, generalized weakness, and diminished appetite. She states her daughter cooked food for her that she liked and she was unable to eat most of it. She states food does not taste right.  Denies nausea or vomiting. Denies thrush. She denies any significant myalgias or arthralgias from the neulasta. She is unsure if she began feeling unwell after the injection or after the chemotherapy. Denies any fever, chills, or night sweats. She lost about 2 lbs since 11/19/19. She does not drink supplemental nutritional drinks such as boost or ensure because she does not like the taste and she normally has an appetite. Denies any chest pain, shortness of breath, cough, or hemoptysis. Denies any nausea, vomiting, or constipation. She has been having some loose stool over the last few days. She is unable to quantify but believes it is less than 5. She had one episode of loose stool today and two last night. She has not taken any imodium for this. Denies abdominal pain or the presence of blood in the stool. Denies any headache or visual changes. Denies any rashes or skin changes. The patient is here today for evaluation and a one week follow up visit.     MEDICAL HISTORY: Past Medical History:  Diagnosis Date  . Cancer ([facility])    Lung   . Carotid artery occlusion   . Clostridium difficile infection 06/2013  . Diabetes mellitus    takes Janumet daily  . Dyslipidemia    takes Crestor daily  . Gallstones   . GERD (gastroesophageal reflux disease)   . Heart murmur    hx of  . History of radiation therapy 06/11/17-06/21/17   right lung 50 Gy in 5 fractions  . Hypertension    takes Prinzide and Verapamil daily  . Impaired speech    from stroke  . Neuropathy, diabetic ([facility])   . Pneumonia   . PONV (postoperative nausea and vomiting)   . Seizures ([facility]) 04/19/2018  recent hospitalization  . Smoker   . Stroke ([facility]) 06/25/13  . Vertigo    but doesn't take any meds    ALLERGIES:  is allergic to codeine; benadryl [diphenhydramine]; lipitor [atorvastatin]; and phenergan [promethazine hcl].  MEDICATIONS:  Current Outpatient Medications  Medication Sig Dispense Refill  . aspirin EC 81 MG tablet  Take 81 mg by mouth daily.    . Blood Glucose Monitoring Suppl (ONE TOUCH ULTRA 2) w/Device KIT 1 Device by Does not apply route 2 (two) times daily. 1 each 0  . cholecalciferol (VITAMIN D3) 25 MCG (1000 UNIT) tablet Take 1,000 Units by mouth daily.    . divalproex (DEPAKOTE ER) 500 MG 24 hr tablet Take one tablet  twice daily 60 tablet 6  . glucose blood (ONE TOUCH ULTRA TEST) test strip Use as instructed 100 each 12  . Lancets (ONETOUCH ULTRASOFT) lancets Use as instructed 100 each 12  . lidocaine-prilocaine (EMLA) cream APPLY TO THE AFFECTED AREA DAILY AS NEEDED (Patient taking differently: Apply 1 application topically daily as needed (Pain). ) 30 g 0  . lisinopril (ZESTRIL) 10 MG tablet TAKE 1 TABLET(10 MG) BY MOUTH DAILY 90 tablet 3  . Multiple Vitamin (MULTIVITAMIN) tablet Take 1 tablet by mouth daily.    . Multiple Vitamins-Minerals (MULTIVITAMIN PO) Take 1 tablet by mouth daily.    . pioglitazone (ACTOS) 30 MG tablet TAKE 1 TABLET(30 MG) BY MOUTH DAILY 90 tablet 1  . prochlorperazine (COMPAZINE) 10 MG tablet Take 1 tablet (10 mg total) by mouth every 6 (six) hours as needed for nausea or vomiting. (Patient not taking: Reported on 08/25/2019) 30 tablet 0  . rosuvastatin (CRESTOR) 20 MG tablet TAKE 1 TABLET BY MOUTH ONCE DAILY 180 tablet 0  . SitaGLIPtin-MetFORMIN HCl (JANUMET XR) 50-1000 MG TB24 Take 1 tablet by mouth daily. 60 tablet 5  . vitamin B-12 (CYANOCOBALAMIN) 1000 MCG tablet Take 1,000 mcg by mouth daily.     No current facility-administered medications for this visit.   Facility-Administered Medications Ordered in Other Visits  Medication Dose Route Frequency Provider Last Rate Last Admin  . heparin lock flush 100 unit/mL  500 Units Intracatheter Once PRN Curt Bears, MD      . sodium chloride flush (NS) 0.9 % injection 10 mL  10 mL Intracatheter PRN Curt Bears, MD        SURGICAL HISTORY:  Past Surgical History:  Procedure Laterality Date  . cataract removed  Right   . CHOLECYSTECTOMY    . COLONOSCOPY    . ENDARTERECTOMY Left 07/14/2013   Procedure: ENDARTERECTOMY CAROTID-LEFT;  Surgeon: Elam Dutch, MD;  Location: [facility];  Service: Vascular;  Laterality: Left;  . EYE SURGERY Left    cataract  . IR CV LINE INJECTION  05/13/2019  . IR IMAGING GUIDED PORT INSERTION  09/15/2018  . IR PATIENT EVAL TECH 0-60 MINS  05/08/2019  . IR PERC PLEURAL DRAIN W/INDWELL CATH W/IMG GUIDE  05/23/2017  . KNEE SURGERY Left 45yr ago  . LAPAROSCOPIC PARTIAL COLECTOMY N/A 09/02/2015   Procedure: LAPAROSCOPIC PARTIAL RIGHT COLECTOMY;  Surgeon: ALeighton Ruff MD;  Location: [facility];  Service: General;  Laterality: N/A;  . PATCH ANGIOPLASTY Left 07/14/2013   Procedure: LEFT CAROTID ARTERY PATCH ANGIOPLASTY;  Surgeon: CElam Dutch MD;  [facility]  Service: Vascular;  Laterality: Left;  .Marland KitchenVIDEO BRONCHOSCOPY N/A 06/04/2018   Procedure: VIDEO BRONCHOSCOPY;  Surgeon: HMelrose Nakayama MD;  Location: [facility]  Service: Thoracic;  Laterality: N/A;  .  wisdom      REVIEW OF SYSTEMS:   Review of Systems  Constitutional: Positive for 2 pound weight loss in 2 weeks, decreased appetite, fatigue, generalized weakness. Negative for chills and fever.  HENT: Negative for mouth sores, nosebleeds, sore throat and trouble swallowing.   Eyes: Negative for eye problems and icterus.  Respiratory: Negative for cough, hemoptysis, shortness of breath and wheezing.   Cardiovascular: Negative for chest pain and leg swelling.  Gastrointestinal: Positive for diarrhea. Negative for abdominal pain, constipation, nausea and vomiting.  Genitourinary: Negative for bladder incontinence, difficulty urinating, dysuria, frequency and hematuria.   Musculoskeletal: Negative for back pain, gait problem, neck pain and neck stiffness.  Skin: Negative for itching and rash.  Neurological: Negative for dizziness, extremity weakness, gait problem, headaches, light-headedness and seizures.   Hematological: Negative for adenopathy. Does not bruise/bleed easily.  Psychiatric/Behavioral: Negative for confusion, depression and sleep disturbance. The patient is not nervous/anxious.     PHYSICAL EXAMINATION:  There were no vitals taken for this visit.  ECOG PERFORMANCE STATUS: 1 - Symptomatic but completely ambulatory  Physical Exam  Constitutional: Oriented to person, place, and time and well-developed, well-nourished, and in no distress.  HENT:  Head: Normocephalic and atraumatic.  Mouth/Throat: Oropharynx is clear and moist. No oropharyngeal exudate.  Eyes: Conjunctivae are normal. Right eye exhibits no discharge. Left eye exhibits no discharge. No scleral icterus.  Neck: Normal range of motion. Neck supple.  Cardiovascular: Normal rate, regular rhythm, normal heart sounds and intact distal pulses.   Pulmonary/Chest: Effort normal and breath sounds normal. No respiratory distress. No wheezes. No rales.  Abdominal: Soft. Bowel sounds are normal. Exhibits no distension and no mass. There is no tenderness.  Musculoskeletal: Normal range of motion. Exhibits no edema.  Lymphadenopathy:    No cervical adenopathy.  Neurological: Alert and oriented to person, place, and time. Exhibits normal muscle tone. Gait normal. Coordination normal.  Skin: Skin is warm and dry. No rash noted. Not diaphoretic. No erythema. No pallor.  Psychiatric: Mood, memory and judgment normal.  Vitals reviewed.  LABORATORY DATA: Lab Results  Component Value Date   WBC 14.2 (H) 11/25/2019   HGB 10.9 (L) 11/25/2019   HCT 34.5 (L) 11/25/2019   MCV 87.3 11/25/2019   PLT 606 (H) 11/25/2019      Chemistry      Component Value Date/Time   NA 142 11/25/2019 0748   NA 139 05/07/2018 1147   K 3.8 11/25/2019 0748   CL 103 11/25/2019 0748   CO2 26 11/25/2019 0748   BUN 14 11/25/2019 0748   BUN 15 05/07/2018 1147   CREATININE 0.86 11/25/2019 0748   CREATININE 0.87 03/08/2015 0001      Component Value  Date/Time   CALCIUM 9.8 11/25/2019 0748   ALKPHOS 78 11/25/2019 0748   AST 11 (L) 11/25/2019 0748   ALT 7 11/25/2019 0748   BILITOT 0.3 11/25/2019 0748       RADIOGRAPHIC STUDIES:  CT Chest W Contrast  Result Date: 11/02/2019 CLINICAL DATA:  Non-small-cell lung cancer.  Restaging. EXAM: CT CHEST WITH CONTRAST TECHNIQUE: Multidetector CT imaging of the chest was performed during intravenous contrast administration. CONTRAST:  94m OMNIPAQUE IOHEXOL 300 MG/ML  SOLN COMPARISON:  09/07/2019 FINDINGS: Cardiovascular: Right Port-A-Cath tip at low SVC. Mild mass-effect upon the SVC, including on 53/2. Felt to be similar. Aortic and branch vessel atherosclerosis. Normal heart size, without pericardial effusion. Multivessel coronary artery atherosclerosis. No central pulmonary embolism, on this non-dedicated study. Mediastinum/Nodes: No supraclavicular  adenopathy. A precarinal node measures 1.1 cm on 50/2 and demonstrates central necrosis. Compare 8 mm on the prior. No hilar adenopathy. Fluid level in the esophagus on 36/2. Lungs/Pleura: Small right pleural effusion, mildly decreased. Anterior loculation is again identified inferiorly. Similar right upper lobe endobronchial obstruction. Right upper lobe heterogeneous lung mass measures on the order of 6.6 x 5.3 cm on 46/2. Compare maximally 7.0 x 4.8 cm on the prior exam. New or progressive mediastinal involvement including on 46/2. Persistent surrounding collapse/consolidation throughout the right upper lobe. Motion degradation in the lower chest. Central superior segment left lower lobe soft tissue density is at the site of soft tissue and ground-glass nodular densities on the prior exam. Example at 3.2 x 2.1 cm on 54/7. Upper Abdomen: Cholecystectomy. Motion degradation continuing into the upper abdomen. Adrenal thickening and nodularity are unchanged. Suspect cysts in the upper pole left kidney. Cholecystectomy. Musculoskeletal: Similar appearance of  nonacute fracture of the anterior right second rib. IMPRESSION: 1. The central right upper lobe lung mass is relatively similar with surrounding right upper lobe collapse/consolidative change. 2. Disease progression, as evidenced by precarinal nodal metastasis and new or increased mediastinal involvement by primary. 3. At the site of previously described left lower lobe ground-glass and soft tissue density nodules is confluent soft tissue density. Presuming the patient has not received radiation therapy to this area, considerations include progressive metastatic disease or a focus of infection. 4. Decrease in small right pleural effusion with persistent loculation. 5. Motion degradation. 6. Coronary artery atherosclerosis. Aortic Atherosclerosis (ICD10-I70.0). 7. Esophageal air fluid level suggests dysmotility or gastroesophageal reflux. Electronically Signed   By: Abigail Miyamoto M.D.   On: 11/02/2019 14:46   NM PET Image Restag (PS) Skull Base To Thigh  Result Date: 11/17/2019 CLINICAL DATA:  Subsequent treatment strategy for non-small cell lung cancer. Concern for recurrence. Chemotherapy completed 2 months prior. COVID vaccine LEFT arm. EXAM: NUCLEAR MEDICINE PET SKULL BASE TO THIGH TECHNIQUE: 6.8 mCi F-18 FDG was injected intravenously. Full-ring PET imaging was performed from the skull base to thigh after the radiotracer. CT data was obtained and used for attenuation correction and anatomic localization. Fasting blood glucose: 156 mg/dl COMPARISON:  PET-CT 05/07/2018, chest CT 11/02/2019 FINDINGS: Mediastinal blood pool activity: SUV max 2.3 Liver activity: SUV max NA NECK: No hypermetabolic lymph nodes in the neck. Incidental CT findings: none CHEST: Hypermetabolic RIGHT upper lobe mass is again demonstrated measuring approximately 5.0 by 5.0 cm with intense peripheral metabolic activity (SUV max equal 17.6). The size and activity is similar to comparison PET-CT scan with a 5 cm mass measuring SUV max equal  11.9 on 05/07/2018. However, there is a new hypermetabolic precarinal lymph node in comparison to PET-CT scan. This lymph node measures 10 mm (image 62/5) with SUV max equal 7.3. Additionally there is hypermetabolic LEFT infrahilar tissue with SUV max equal 12.1. This infrahilar tissue measures 2.4 by 1.9 cm on image 71/4. This tissue is new from comparison PET-CT scan. Incidental CT findings: New RIGHT effusion compared to remote PET-CT scan but similar to most recent CT scan. ABDOMEN/PELVIS: No abnormal hypermetabolic activity within the liver, pancreas, adrenal glands, or spleen. No hypermetabolic lymph nodes in the abdomen or pelvis. Incidental CT findings: none SKELETON: No focal hypermetabolic activity to suggest skeletal metastasis. Incidental CT findings: none IMPRESSION: 1. Compared to PET-CT scan 05/07/2018, there is a new hypermetabolic precarinal metastatic lymph node and new hypermetabolic LEFT infrahilar thickened tissue consistent with metastatic disease. This new metastatic disease was  characterized on recent diagnostic CT 11/02/2019. 2. Essentially stable RIGHT upper lobe hypermetabolic mass. 3. No evidence of distant metastatic disease outside the thorax. 4. Moderate RIGHT pleural effusions similar prior. Electronically Signed   By: Suzy Bouchard M.D.   On: 11/17/2019 09:52     ASSESSMENT/PLAN:   This is a very pleasant 79 year old Caucasian female with recurrentstage IIIAnon-small cell lung cancer, squamous cell carcinomaof the right upper lobe. She presented initially as a stage IIa in August of 2018.  The patient underwent a course of concurrent chemoradiation with weekly carboplatin and paclitaxel. She status post 7 cycles. She tolerated treatment well. She is currently undergoing consolidation immunotherapy with Imfinzi 10 mg/kg IV every 2 weeks. She is status post26cycles.  She tolerated it well without any adverse side effects.   She recently completed SBRT under the  care of Dr. Sondra Come to the enlarging left pulmonary nodule on 03/26/2019.  She recently showed evidence of disease progression and started on treatment with carboplatin for an AUC of 5, paclitaxel 175 mg/m2 and Keytruda 200 mg IV every 3 weeks. She is status post 1 cycle.    Labs were reviewed. We will re-evaluate her in two weeks before starting cycle #2 to re-assess for improvement in her symptoms. I will communicate to Dr. Julien Nordmann that she had a challenging time with cycle #1.   We will see her back for a follow up visiting 2 weeks for evaluation before starting cycle #2.   Regarding the taste changes, the patient was advised to rinse her mouth with biotene and salt water rinses. She also was instructed to use imodium if she has loose stools. She is afebrile and denies any abdominal pain. Advised her to drink plenty of fluid. Discussed if her symptoms worsen or fail to improve, we are always happy to see her in the [facility] if she feels as though she needs to be seen prior to her next scheduled appointment.   Encouraged the patient to use claritin while receiving neulasta. She did not take that before her recent injection because she was not sure if she was allowed to take it.   The patient was advised to call immediately if she has any concerning symptoms in the interval. The patient voices understanding of current disease status and treatment options and is in agreement with the current care plan. All questions were answered. The patient knows to call the clinic with any problems, questions or concerns. We can certainly see the patient much sooner if necessary        No orders of the defined types were placed in this encounter.    Monte Zinni L Janisa Labus, PA-C 11/30/19

## 2019-11-30 NOTE — Telephone Encounter (Signed)
Called pt on Friday but did not reach her.  Called again today to check on her post treatment to see how she was doing.  Left message to call back.

## 2019-12-02 ENCOUNTER — Inpatient Hospital Stay: Payer: Medicare Other

## 2019-12-02 ENCOUNTER — Other Ambulatory Visit: Payer: Self-pay

## 2019-12-02 ENCOUNTER — Other Ambulatory Visit (INDEPENDENT_AMBULATORY_CARE_PROVIDER_SITE_OTHER): Payer: Medicare Other

## 2019-12-02 ENCOUNTER — Encounter: Payer: Medicare Other | Admitting: Physical Therapy

## 2019-12-02 ENCOUNTER — Inpatient Hospital Stay: Payer: Medicare Other | Admitting: Physician Assistant

## 2019-12-02 VITALS — BP 128/48 | HR 91 | Temp 98.2°F | Resp 18 | Ht 66.0 in | Wt 131.7 lb

## 2019-12-02 DIAGNOSIS — C7802 Secondary malignant neoplasm of left lung: Secondary | ICD-10-CM | POA: Diagnosis not present

## 2019-12-02 DIAGNOSIS — Z8673 Personal history of transient ischemic attack (TIA), and cerebral infarction without residual deficits: Secondary | ICD-10-CM | POA: Diagnosis not present

## 2019-12-02 DIAGNOSIS — C3491 Malignant neoplasm of unspecified part of right bronchus or lung: Secondary | ICD-10-CM | POA: Diagnosis not present

## 2019-12-02 DIAGNOSIS — Z7984 Long term (current) use of oral hypoglycemic drugs: Secondary | ICD-10-CM | POA: Diagnosis not present

## 2019-12-02 DIAGNOSIS — Z79899 Other long term (current) drug therapy: Secondary | ICD-10-CM | POA: Diagnosis not present

## 2019-12-02 DIAGNOSIS — Z923 Personal history of irradiation: Secondary | ICD-10-CM | POA: Diagnosis not present

## 2019-12-02 DIAGNOSIS — Z9221 Personal history of antineoplastic chemotherapy: Secondary | ICD-10-CM | POA: Diagnosis not present

## 2019-12-02 DIAGNOSIS — E538 Deficiency of other specified B group vitamins: Secondary | ICD-10-CM

## 2019-12-02 DIAGNOSIS — E1140 Type 2 diabetes mellitus with diabetic neuropathy, unspecified: Secondary | ICD-10-CM | POA: Diagnosis not present

## 2019-12-02 DIAGNOSIS — C3411 Malignant neoplasm of upper lobe, right bronchus or lung: Secondary | ICD-10-CM | POA: Diagnosis not present

## 2019-12-02 DIAGNOSIS — Z5111 Encounter for antineoplastic chemotherapy: Secondary | ICD-10-CM | POA: Diagnosis not present

## 2019-12-02 DIAGNOSIS — I10 Essential (primary) hypertension: Secondary | ICD-10-CM | POA: Diagnosis not present

## 2019-12-02 DIAGNOSIS — E785 Hyperlipidemia, unspecified: Secondary | ICD-10-CM | POA: Diagnosis not present

## 2019-12-02 DIAGNOSIS — Z5112 Encounter for antineoplastic immunotherapy: Secondary | ICD-10-CM | POA: Diagnosis not present

## 2019-12-02 DIAGNOSIS — Z7982 Long term (current) use of aspirin: Secondary | ICD-10-CM | POA: Diagnosis not present

## 2019-12-02 DIAGNOSIS — Z5189 Encounter for other specified aftercare: Secondary | ICD-10-CM | POA: Diagnosis not present

## 2019-12-02 LAB — CBC WITH DIFFERENTIAL (CANCER CENTER ONLY)
Abs Immature Granulocytes: 1.69 10*3/uL — ABNORMAL HIGH (ref 0.00–0.07)
Basophils Absolute: 0.1 10*3/uL (ref 0.0–0.1)
Basophils Relative: 0 %
Eosinophils Absolute: 0.4 10*3/uL (ref 0.0–0.5)
Eosinophils Relative: 1 %
HCT: 32.6 % — ABNORMAL LOW (ref 36.0–46.0)
Hemoglobin: 10.1 g/dL — ABNORMAL LOW (ref 12.0–15.0)
Immature Granulocytes: 6 %
Lymphocytes Relative: 7 %
Lymphs Abs: 1.9 10*3/uL (ref 0.7–4.0)
MCH: 27 pg (ref 26.0–34.0)
MCHC: 31 g/dL (ref 30.0–36.0)
MCV: 87.2 fL (ref 80.0–100.0)
Monocytes Absolute: 2.3 10*3/uL — ABNORMAL HIGH (ref 0.1–1.0)
Monocytes Relative: 8 %
Neutro Abs: 22.2 10*3/uL — ABNORMAL HIGH (ref 1.7–7.7)
Neutrophils Relative %: 78 %
Platelet Count: 391 10*3/uL (ref 150–400)
RBC: 3.74 MIL/uL — ABNORMAL LOW (ref 3.87–5.11)
RDW: 15.8 % — ABNORMAL HIGH (ref 11.5–15.5)
WBC Count: 28.5 10*3/uL — ABNORMAL HIGH (ref 4.0–10.5)
nRBC: 0 % (ref 0.0–0.2)

## 2019-12-02 LAB — CMP (CANCER CENTER ONLY)
ALT: 8 U/L (ref 0–44)
AST: 13 U/L — ABNORMAL LOW (ref 15–41)
Albumin: 2.9 g/dL — ABNORMAL LOW (ref 3.5–5.0)
Alkaline Phosphatase: 152 U/L — ABNORMAL HIGH (ref 38–126)
Anion gap: 10 (ref 5–15)
BUN: 9 mg/dL (ref 8–23)
CO2: 29 mmol/L (ref 22–32)
Calcium: 9.9 mg/dL (ref 8.9–10.3)
Chloride: 100 mmol/L (ref 98–111)
Creatinine: 0.84 mg/dL (ref 0.44–1.00)
GFR, Est AFR Am: >60 mL/min (ref >60)
GFR, Estimated: >60 mL/min (ref >60)
Glucose, Bld: 219 mg/dL — ABNORMAL HIGH (ref 70–99)
Potassium: 3.4 mmol/L — ABNORMAL LOW (ref 3.5–5.1)
Sodium: 139 mmol/L (ref 135–145)
Total Bilirubin: 0.3 mg/dL (ref 0.3–1.2)
Total Protein: 6.6 g/dL (ref 6.5–8.1)

## 2019-12-02 MED ORDER — CYANOCOBALAMIN 1000 MCG/ML IJ SOLN
1000.0000 ug | Freq: Once | INTRAMUSCULAR | Status: AC
  Administered 2019-12-02: 1000 ug via INTRAMUSCULAR

## 2019-12-03 LAB — VITAMIN B12: Vitamin B-12: >2000 pg/mL — ABNORMAL HIGH (ref 232–1245)

## 2019-12-04 ENCOUNTER — Encounter: Payer: Medicare Other | Admitting: Rehabilitative and Restorative Service Providers"

## 2019-12-09 ENCOUNTER — Inpatient Hospital Stay: Payer: Medicare Other

## 2019-12-09 ENCOUNTER — Other Ambulatory Visit: Payer: Self-pay

## 2019-12-09 ENCOUNTER — Ambulatory Visit: Payer: Medicare Other | Admitting: Physical Therapy

## 2019-12-09 DIAGNOSIS — M25611 Stiffness of right shoulder, not elsewhere classified: Secondary | ICD-10-CM | POA: Diagnosis not present

## 2019-12-09 DIAGNOSIS — Z9221 Personal history of antineoplastic chemotherapy: Secondary | ICD-10-CM | POA: Diagnosis not present

## 2019-12-09 DIAGNOSIS — M6281 Muscle weakness (generalized): Secondary | ICD-10-CM | POA: Diagnosis not present

## 2019-12-09 DIAGNOSIS — R293 Abnormal posture: Secondary | ICD-10-CM | POA: Diagnosis not present

## 2019-12-09 DIAGNOSIS — Z7984 Long term (current) use of oral hypoglycemic drugs: Secondary | ICD-10-CM | POA: Diagnosis not present

## 2019-12-09 DIAGNOSIS — Z95828 Presence of other vascular implants and grafts: Secondary | ICD-10-CM

## 2019-12-09 DIAGNOSIS — I10 Essential (primary) hypertension: Secondary | ICD-10-CM | POA: Diagnosis not present

## 2019-12-09 DIAGNOSIS — E1140 Type 2 diabetes mellitus with diabetic neuropathy, unspecified: Secondary | ICD-10-CM | POA: Diagnosis not present

## 2019-12-09 DIAGNOSIS — Z5189 Encounter for other specified aftercare: Secondary | ICD-10-CM | POA: Diagnosis not present

## 2019-12-09 DIAGNOSIS — C3491 Malignant neoplasm of unspecified part of right bronchus or lung: Secondary | ICD-10-CM

## 2019-12-09 DIAGNOSIS — Z5112 Encounter for antineoplastic immunotherapy: Secondary | ICD-10-CM | POA: Diagnosis not present

## 2019-12-09 DIAGNOSIS — Z79899 Other long term (current) drug therapy: Secondary | ICD-10-CM | POA: Diagnosis not present

## 2019-12-09 DIAGNOSIS — C7802 Secondary malignant neoplasm of left lung: Secondary | ICD-10-CM | POA: Diagnosis not present

## 2019-12-09 DIAGNOSIS — G8929 Other chronic pain: Secondary | ICD-10-CM

## 2019-12-09 DIAGNOSIS — Z8673 Personal history of transient ischemic attack (TIA), and cerebral infarction without residual deficits: Secondary | ICD-10-CM | POA: Diagnosis not present

## 2019-12-09 DIAGNOSIS — Z7982 Long term (current) use of aspirin: Secondary | ICD-10-CM | POA: Diagnosis not present

## 2019-12-09 DIAGNOSIS — C3411 Malignant neoplasm of upper lobe, right bronchus or lung: Secondary | ICD-10-CM | POA: Diagnosis not present

## 2019-12-09 DIAGNOSIS — M25511 Pain in right shoulder: Secondary | ICD-10-CM | POA: Diagnosis not present

## 2019-12-09 DIAGNOSIS — Z923 Personal history of irradiation: Secondary | ICD-10-CM | POA: Diagnosis not present

## 2019-12-09 DIAGNOSIS — Z5111 Encounter for antineoplastic chemotherapy: Secondary | ICD-10-CM | POA: Diagnosis not present

## 2019-12-09 DIAGNOSIS — E785 Hyperlipidemia, unspecified: Secondary | ICD-10-CM | POA: Diagnosis not present

## 2019-12-09 LAB — CBC WITH DIFFERENTIAL (CANCER CENTER ONLY)
Abs Immature Granulocytes: 0.4 10*3/uL — ABNORMAL HIGH (ref 0.00–0.07)
Basophils Absolute: 0.1 10*3/uL (ref 0.0–0.1)
Basophils Relative: 0 %
Eosinophils Absolute: 0.1 10*3/uL (ref 0.0–0.5)
Eosinophils Relative: 0 %
HCT: 30.7 % — ABNORMAL LOW (ref 36.0–46.0)
Hemoglobin: 9.7 g/dL — ABNORMAL LOW (ref 12.0–15.0)
Immature Granulocytes: 2 %
Lymphocytes Relative: 6 %
Lymphs Abs: 1.3 10*3/uL (ref 0.7–4.0)
MCH: 27.4 pg (ref 26.0–34.0)
MCHC: 31.6 g/dL (ref 30.0–36.0)
MCV: 86.7 fL (ref 80.0–100.0)
Monocytes Absolute: 1.1 10*3/uL — ABNORMAL HIGH (ref 0.1–1.0)
Monocytes Relative: 5 %
Neutro Abs: 19.2 10*3/uL — ABNORMAL HIGH (ref 1.7–7.7)
Neutrophils Relative %: 87 %
Platelet Count: 422 10*3/uL — ABNORMAL HIGH (ref 150–400)
RBC: 3.54 MIL/uL — ABNORMAL LOW (ref 3.87–5.11)
RDW: 15.9 % — ABNORMAL HIGH (ref 11.5–15.5)
WBC Count: 22.2 10*3/uL — ABNORMAL HIGH (ref 4.0–10.5)
nRBC: 0 % (ref 0.0–0.2)

## 2019-12-09 LAB — CMP (CANCER CENTER ONLY)
ALT: 6 U/L (ref 0–44)
AST: 11 U/L — ABNORMAL LOW (ref 15–41)
Albumin: 2.6 g/dL — ABNORMAL LOW (ref 3.5–5.0)
Alkaline Phosphatase: 121 U/L (ref 38–126)
Anion gap: 9 (ref 5–15)
BUN: 9 mg/dL (ref 8–23)
CO2: 30 mmol/L (ref 22–32)
Calcium: 9.1 mg/dL (ref 8.9–10.3)
Chloride: 101 mmol/L (ref 98–111)
Creatinine: 0.83 mg/dL (ref 0.44–1.00)
GFR, Est AFR Am: >60 mL/min (ref >60)
GFR, Estimated: >60 mL/min (ref >60)
Glucose, Bld: 148 mg/dL — ABNORMAL HIGH (ref 70–99)
Potassium: 3.4 mmol/L — ABNORMAL LOW (ref 3.5–5.1)
Sodium: 140 mmol/L (ref 135–145)
Total Bilirubin: 0.4 mg/dL (ref 0.3–1.2)
Total Protein: 6.6 g/dL (ref 6.5–8.1)

## 2019-12-09 MED ORDER — HEPARIN SOD (PORK) LOCK FLUSH 100 UNIT/ML IV SOLN
500.0000 [IU] | Freq: Once | INTRAVENOUS | Status: AC | PRN
  Administered 2019-12-09: 500 [IU]
  Filled 2019-12-09: qty 5

## 2019-12-09 MED ORDER — SODIUM CHLORIDE 0.9% FLUSH
10.0000 mL | INTRAVENOUS | Status: DC | PRN
  Administered 2019-12-09: 10 mL
  Filled 2019-12-09: qty 10

## 2019-12-09 NOTE — Therapy (Addendum)
[facility] [place], [place], [address] Phone: [phone]   Fax:  [phone]  Physical Therapy Treatment Discharge  Patient Details  Name: [name] MRN: [id number] Date of Birth: 05/15/1941 Referring Provider (PT): Leandrew Koyanagi, MD   Encounter Date: 12/09/2019  PT End of Session - 12/09/19 1355    Visit Number  3    Number of Visits  12    Date for PT Re-Evaluation  12/30/19    PT Start Time  1941    PT Stop Time  1430    PT Time Calculation (min)  45 min    Activity Tolerance  Patient tolerated treatment well    Behavior During Therapy  [facility] for tasks assessed/performed       Past Medical History:  Diagnosis Date  . Cancer ([place])    Lung   . Carotid artery occlusion   . Clostridium difficile infection 06/2013  . Diabetes mellitus    takes Janumet daily  . Dyslipidemia    takes Crestor daily  . Gallstones   . GERD (gastroesophageal reflux disease)   . Heart murmur    hx of  . History of radiation therapy 06/11/17-06/21/17   right lung 50 Gy in 5 fractions  . Hypertension    takes Prinzide and Verapamil daily  . Impaired speech    from stroke  . Neuropathy, diabetic ([place])   . Pneumonia   . PONV (postoperative nausea and vomiting)   . Seizures ([place]) 04/19/2018   recent hospitalization  . Smoker   . Stroke ([place]) 06/25/13  . Vertigo    but doesn't take any meds    Past Surgical History:  Procedure Laterality Date  . cataract removed Right   . CHOLECYSTECTOMY    . COLONOSCOPY    . ENDARTERECTOMY Left 07/14/2013   Procedure: ENDARTERECTOMY CAROTID-LEFT;  Surgeon: Elam Dutch, MD;  Location: [facility];  Service: Vascular;  Laterality: Left;  . EYE SURGERY Left    cataract  . IR CV LINE INJECTION  05/13/2019  . IR IMAGING GUIDED PORT INSERTION  09/15/2018  . IR PATIENT EVAL TECH 0-60 MINS  05/08/2019  . IR PERC PLEURAL DRAIN W/INDWELL CATH W/IMG GUIDE  05/23/2017  . KNEE SURGERY Left 45yr ago  .  LAPAROSCOPIC PARTIAL COLECTOMY N/A 09/02/2015   Procedure: LAPAROSCOPIC PARTIAL RIGHT COLECTOMY;  Surgeon: ALeighton Ruff MD;  Location: [facility];  Service: General;  Laterality: N/A;  . PATCH ANGIOPLASTY Left 07/14/2013   Procedure: LEFT CAROTID ARTERY PATCH ANGIOPLASTY;  Surgeon: CElam Dutch MD;  [facility]  Service: Vascular;  Laterality: Left;  .Marland KitchenVIDEO BRONCHOSCOPY N/A 06/04/2018   Procedure: VIDEO BRONCHOSCOPY;  Surgeon: HMelrose Nakayama MD;  [facility]  Service: Thoracic;  Laterality: N/A;  . wisdom      There were no vitals filed for this visit.  Subjective Assessment - 12/09/19 1348    Subjective  Pt arring to therapy reporting, "that chemo is rough". Pt reporting her shoulder is better, with pain of 5/10.    Pertinent History  lung cancer, DM, HTN, neuropathy, seizures, stroke    Limitations  Lifting    Patient Stated Goals  improve motion and pain    Currently in Pain?  Yes    Pain Score  5     Pain Location  Shoulder    Pain Orientation  Right    Pain Descriptors / Indicators  Sore    Pain Type  Chronic pain  Pain Onset  More than a month ago    Pain Frequency  Intermittent                        [facility] Adult PT Treatment/Exercise - 12/09/19 0001      Exercises   Exercises  Shoulder      Shoulder Exercises: Supine   External Rotation  AAROM;5 reps;Right   1# bar   Flexion  AAROM;5 reps   1# bar     Shoulder Exercises: Seated   Other Seated Exercises  table slides: flexion, ER, scaption    Other Seated Exercises  ball squeezes x 10 holding 5 seconds, circles on ball with arm outstretched x 15 each direction      Shoulder Exercises: Pulleys   Flexion  2 minutes    Scaption  2 minutes      Manual Therapy   Manual Therapy  Joint mobilization;Soft tissue mobilization    Manual therapy comments  10 minutes    Joint Mobilization  AP mobs R shoulder, scapular mobs    Soft tissue mobilization  STM to R upper trap, medial  scapular border, infraspinatus and anterior shoulder/biceps tendon                  PT Long Term Goals - 11/18/19 1604      PT LONG TERM GOAL #1   Title  independent with HEP    Status  New    Target Date  12/30/19      PT LONG TERM GOAL #2   Title  improve Rt shoulder flexion and abduction AROM by 20 degrees each direction for improved function    Status  New    Target Date  12/30/19      PT LONG TERM GOAL #3   Title  report pain < 3/10 with shoulder reaching tasks for improved function    Status  New    Target Date  12/30/19      PT LONG TERM GOAL #4   Title  report 50% improvement in ADLs for improved function    Status  New    Target Date  12/30/19            Plan - 12/09/19 1356    Clinical Impression Statement  Pt presenting to therapy today reporting 5/10 pain in her  R shoulder. Pt reporting weakness from the Chemo. Pt stated she has lost her appetite, but she was willing to try therpay today. We concentrated on gentle stretching. Pt tolerating well. Conitnue to progress with PT as pt tolerates.    Comorbidities  active lung cancer, DM, HTN, neuropathy, seizures, stroke    Examination-Activity Limitations  Bathing;Lift;Reach Overhead;Dressing;Hygiene/Grooming    Examination-Participation Restrictions  Community Activity    Stability/Clinical Decision Making  Evolving/Moderate complexity    Rehab Potential  Fair    PT Frequency  2x / week    PT Duration  6 weeks    PT Treatment/Interventions  ADLs/Self Care Home Management;Cryotherapy;Ultrasound;Moist Heat;Iontophoresis 53m/ml Dexamethasone;Functional mobility training;Therapeutic activities;Patient/family education;Therapeutic exercise;Manual techniques;Dry needling;Passive range of motion;Taping    PT Next Visit Plan  review HEP, continue with AA/AROM exercises, postural strength, PROM    PT Home Exercise Plan  Access Code: [email or web address]   Consulted and Agree with Plan of Care  Patient;Family  member/caregiver    Family Member Consulted  daughter       Patient will benefit from skilled therapeutic intervention in order to improve the  following deficits and impairments:  Increased fascial restricitons, Increased muscle spasms, Pain, Postural dysfunction, Decreased range of motion, Decreased strength, Hypomobility, Impaired UE functional use  Visit Diagnosis: Chronic right shoulder pain  Stiffness of right shoulder, not elsewhere classified  Abnormal posture  Muscle weakness (generalized)    PHYSICAL THERAPY DISCHARGE SUMMARY  Visits from Start of Care: 3 visits of 12 attended  Current functional level related to goals / functional outcomes: See above   Remaining deficits: See above   Education / Equipment: HEP Plan: Patient agrees to discharge.  Patient goals were not met. Patient is being discharged due to not returning since the last visit.  ?????       Problem List Patient Active Problem List   Diagnosis Date Noted  . Stage IV squamous cell carcinoma of right lung ([place]) 11/19/2019  . Chronic right shoulder pain 11/10/2019  . Port-A-Cath in place 10/21/2018  . Stage III squamous cell carcinoma of right lung ([place]) 06/09/2018  . Encounter for antineoplastic chemotherapy 06/09/2018  . Encounter for antineoplastic immunotherapy 06/09/2018  . Goals of care, counseling/discussion 05/16/2018  . Partial seizure ([place]) 04/21/2018  . TIA (transient ischemic attack) 04/09/2018  . Malnutrition of moderate degree 03/01/2018  . Non-small cell lung cancer ([place]) 05/28/2017  . Migraine equivalent 04/15/2017  . Hyperlipidemia associated with type 2 diabetes mellitus ([place]) 03/07/2016  . Adenomatous colon polyp s/p laparoscopic right colectomy 09/02/15 09/02/2015  . Hemispheric carotid artery syndrome 04/07/2015  . Cataract associated with type 2 diabetes mellitus ([place]) 03/08/2015  . S/P recent Left carotid endarterectomy 07/14/2013  . History of CVA (cerebrovascular  accident) 06/27/2013  . Hypertension associated with diabetes ([place]) 01/04/2011  . DM type 2 with diabetic dyslipidemia ([place]) 01/04/2011  . Light smoker 01/04/2011    Oretha Caprice, PT, MPT 12/09/2019, 2:25 PM Kearney Hard, PT, MPT 05/16/20 1:03 PM    [facility] [address] [place], [place], [address] Phone: [phone]   Fax:  [phone]  Name: [name] MRN: [id number] Date of Birth: [birth year]

## 2019-12-09 NOTE — Progress Notes (Signed)
Pharmacist Chemotherapy Monitoring - Follow Up Assessment    I verify that I have reviewed each item in the below checklist:  . Regimen for the patient is scheduled for the appropriate day and plan matches scheduled date. Marland Kitchen Appropriate non-routine labs are ordered dependent on drug ordered. . If applicable, additional medications reviewed and ordered per protocol based on lifetime cumulative doses and/or treatment regimen.   Plan for follow-up and/or issues identified: No . I-vent associated with next due treatment: No . MD and/or nursing notified: No  Faten Frieson D 12/09/2019 4:14 PM

## 2019-12-11 ENCOUNTER — Encounter: Payer: Medicare Other | Admitting: Rehabilitative and Restorative Service Providers"

## 2019-12-16 ENCOUNTER — Inpatient Hospital Stay: Payer: Medicare Other | Attending: Internal Medicine

## 2019-12-16 ENCOUNTER — Other Ambulatory Visit: Payer: Self-pay

## 2019-12-16 ENCOUNTER — Inpatient Hospital Stay: Payer: Medicare Other

## 2019-12-16 ENCOUNTER — Inpatient Hospital Stay: Payer: Medicare Other | Admitting: Internal Medicine

## 2019-12-16 ENCOUNTER — Encounter: Payer: Self-pay | Admitting: Internal Medicine

## 2019-12-16 ENCOUNTER — Other Ambulatory Visit: Payer: Medicare Other

## 2019-12-16 VITALS — BP 136/55 | HR 100 | Temp 98.2°F | Resp 20 | Ht 66.0 in | Wt 130.7 lb

## 2019-12-16 DIAGNOSIS — C3491 Malignant neoplasm of unspecified part of right bronchus or lung: Secondary | ICD-10-CM

## 2019-12-16 DIAGNOSIS — Z5111 Encounter for antineoplastic chemotherapy: Secondary | ICD-10-CM

## 2019-12-16 DIAGNOSIS — Z79899 Other long term (current) drug therapy: Secondary | ICD-10-CM | POA: Diagnosis not present

## 2019-12-16 DIAGNOSIS — Z7982 Long term (current) use of aspirin: Secondary | ICD-10-CM | POA: Diagnosis not present

## 2019-12-16 DIAGNOSIS — Z923 Personal history of irradiation: Secondary | ICD-10-CM | POA: Insufficient documentation

## 2019-12-16 DIAGNOSIS — Z5112 Encounter for antineoplastic immunotherapy: Secondary | ICD-10-CM | POA: Insufficient documentation

## 2019-12-16 DIAGNOSIS — F17210 Nicotine dependence, cigarettes, uncomplicated: Secondary | ICD-10-CM | POA: Insufficient documentation

## 2019-12-16 DIAGNOSIS — Z9221 Personal history of antineoplastic chemotherapy: Secondary | ICD-10-CM | POA: Diagnosis not present

## 2019-12-16 DIAGNOSIS — C3411 Malignant neoplasm of upper lobe, right bronchus or lung: Secondary | ICD-10-CM | POA: Insufficient documentation

## 2019-12-16 DIAGNOSIS — E119 Type 2 diabetes mellitus without complications: Secondary | ICD-10-CM | POA: Diagnosis not present

## 2019-12-16 DIAGNOSIS — Z5189 Encounter for other specified aftercare: Secondary | ICD-10-CM | POA: Insufficient documentation

## 2019-12-16 DIAGNOSIS — Z95828 Presence of other vascular implants and grafts: Secondary | ICD-10-CM

## 2019-12-16 DIAGNOSIS — I10 Essential (primary) hypertension: Secondary | ICD-10-CM | POA: Diagnosis not present

## 2019-12-16 LAB — CBC WITH DIFFERENTIAL (CANCER CENTER ONLY)
Abs Immature Granulocytes: 0.09 10*3/uL — ABNORMAL HIGH (ref 0.00–0.07)
Basophils Absolute: 0.1 10*3/uL (ref 0.0–0.1)
Basophils Relative: 0 %
Eosinophils Absolute: 0.1 10*3/uL (ref 0.0–0.5)
Eosinophils Relative: 0 %
HCT: 29.9 % — ABNORMAL LOW (ref 36.0–46.0)
Hemoglobin: 9.3 g/dL — ABNORMAL LOW (ref 12.0–15.0)
Immature Granulocytes: 1 %
Lymphocytes Relative: 6 %
Lymphs Abs: 0.9 10*3/uL (ref 0.7–4.0)
MCH: 26.7 pg (ref 26.0–34.0)
MCHC: 31.1 g/dL (ref 30.0–36.0)
MCV: 85.9 fL (ref 80.0–100.0)
Monocytes Absolute: 0.9 10*3/uL (ref 0.1–1.0)
Monocytes Relative: 6 %
Neutro Abs: 13.6 10*3/uL — ABNORMAL HIGH (ref 1.7–7.7)
Neutrophils Relative %: 87 %
Platelet Count: 592 10*3/uL — ABNORMAL HIGH (ref 150–400)
RBC: 3.48 MIL/uL — ABNORMAL LOW (ref 3.87–5.11)
RDW: 16.3 % — ABNORMAL HIGH (ref 11.5–15.5)
WBC Count: 15.6 10*3/uL — ABNORMAL HIGH (ref 4.0–10.5)
nRBC: 0 % (ref 0.0–0.2)

## 2019-12-16 LAB — CMP (CANCER CENTER ONLY)
ALT: <6 U/L (ref 0–44)
AST: 9 U/L — ABNORMAL LOW (ref 15–41)
Albumin: 2.5 g/dL — ABNORMAL LOW (ref 3.5–5.0)
Alkaline Phosphatase: 89 U/L (ref 38–126)
Anion gap: 11 (ref 5–15)
BUN: 10 mg/dL (ref 8–23)
CO2: 28 mmol/L (ref 22–32)
Calcium: 9.1 mg/dL (ref 8.9–10.3)
Chloride: 102 mmol/L (ref 98–111)
Creatinine: 0.83 mg/dL (ref 0.44–1.00)
GFR, Est AFR Am: >60 mL/min (ref >60)
GFR, Estimated: >60 mL/min (ref >60)
Glucose, Bld: 222 mg/dL — ABNORMAL HIGH (ref 70–99)
Potassium: 3.4 mmol/L — ABNORMAL LOW (ref 3.5–5.1)
Sodium: 141 mmol/L (ref 135–145)
Total Bilirubin: 0.3 mg/dL (ref 0.3–1.2)
Total Protein: 6.1 g/dL — ABNORMAL LOW (ref 6.5–8.1)

## 2019-12-16 LAB — TSH: TSH: 1.399 u[IU]/mL (ref 0.350–4.500)

## 2019-12-16 MED ORDER — FAMOTIDINE IN NACL 20-0.9 MG/50ML-% IV SOLN
20.0000 mg | Freq: Once | INTRAVENOUS | Status: AC
  Administered 2019-12-16: 20 mg via INTRAVENOUS

## 2019-12-16 MED ORDER — PALONOSETRON HCL INJECTION 0.25 MG/5ML
0.2500 mg | Freq: Once | INTRAVENOUS | Status: AC
  Administered 2019-12-16: 0.25 mg via INTRAVENOUS

## 2019-12-16 MED ORDER — HEPARIN SOD (PORK) LOCK FLUSH 100 UNIT/ML IV SOLN
500.0000 [IU] | Freq: Once | INTRAVENOUS | Status: AC | PRN
  Administered 2019-12-16: 500 [IU]
  Filled 2019-12-16: qty 5

## 2019-12-16 MED ORDER — PALONOSETRON HCL INJECTION 0.25 MG/5ML
INTRAVENOUS | Status: AC
  Filled 2019-12-16: qty 5

## 2019-12-16 MED ORDER — DIPHENHYDRAMINE HCL 50 MG/ML IJ SOLN
25.0000 mg | Freq: Once | INTRAMUSCULAR | Status: AC
  Administered 2019-12-16: 25 mg via INTRAVENOUS

## 2019-12-16 MED ORDER — SODIUM CHLORIDE 0.9 % IV SOLN
346.0000 mg | Freq: Once | INTRAVENOUS | Status: AC
  Administered 2019-12-16: 350 mg via INTRAVENOUS
  Filled 2019-12-16: qty 35

## 2019-12-16 MED ORDER — SODIUM CHLORIDE 0.9 % IV SOLN
175.0000 mg/m2 | Freq: Once | INTRAVENOUS | Status: AC
  Administered 2019-12-16: 294 mg via INTRAVENOUS
  Filled 2019-12-16: qty 49

## 2019-12-16 MED ORDER — SODIUM CHLORIDE 0.9% FLUSH
10.0000 mL | INTRAVENOUS | Status: DC | PRN
  Administered 2019-12-16: 10 mL
  Filled 2019-12-16: qty 10

## 2019-12-16 MED ORDER — SODIUM CHLORIDE 0.9 % IV SOLN
200.0000 mg | Freq: Once | INTRAVENOUS | Status: AC
  Administered 2019-12-16: 200 mg via INTRAVENOUS
  Filled 2019-12-16: qty 8

## 2019-12-16 MED ORDER — DIPHENHYDRAMINE HCL 50 MG/ML IJ SOLN
INTRAMUSCULAR | Status: AC
  Filled 2019-12-16: qty 1

## 2019-12-16 MED ORDER — SODIUM CHLORIDE 0.9 % IV SOLN
150.0000 mg | Freq: Once | INTRAVENOUS | Status: AC
  Administered 2019-12-16: 150 mg via INTRAVENOUS
  Filled 2019-12-16: qty 150

## 2019-12-16 MED ORDER — FAMOTIDINE IN NACL 20-0.9 MG/50ML-% IV SOLN
INTRAVENOUS | Status: AC
  Filled 2019-12-16: qty 50

## 2019-12-16 MED ORDER — SODIUM CHLORIDE 0.9 % IV SOLN
10.0000 mg | Freq: Once | INTRAVENOUS | Status: AC
  Administered 2019-12-16: 10 mg via INTRAVENOUS
  Filled 2019-12-16: qty 10

## 2019-12-16 MED ORDER — SODIUM CHLORIDE 0.9 % IV SOLN
Freq: Once | INTRAVENOUS | Status: AC
  Filled 2019-12-16: qty 250

## 2019-12-16 NOTE — Patient Instructions (Signed)
[facility] Discharge Instructions for Patients Receiving Chemotherapy  Today you received the following chemotherapy agents: Keytruda, Taxol, Carboplatin  To help prevent nausea and vomiting after your treatment, we encourage you to take your nausea medication as prescribed by your physician.   If you develop nausea and vomiting that is not controlled by your nausea medication, call the clinic.   BELOW ARE SYMPTOMS THAT SHOULD BE REPORTED IMMEDIATELY:  *FEVER GREATER THAN 100.5 F  *CHILLS WITH OR WITHOUT FEVER  NAUSEA AND VOMITING THAT IS NOT CONTROLLED WITH YOUR NAUSEA MEDICATION  *UNUSUAL SHORTNESS OF BREATH  *UNUSUAL BRUISING OR BLEEDING  TENDERNESS IN MOUTH AND THROAT WITH OR WITHOUT PRESENCE OF ULCERS  *URINARY PROBLEMS  *BOWEL PROBLEMS  UNUSUAL RASH Items with * indicate a potential emergency and should be followed up as soon as possible.  Feel free to call the clinic should you have any questions or concerns. The clinic phone number is [phone].  Please show the [facility] at check-in to the Emergency Department and triage nurse.  Pembrolizumab injection What is this medicine? PEMBROLIZUMAB (pem broe liz ue mab) is a monoclonal antibody. It is used to treat certain types of cancer. This medicine may be used for other purposes; ask your health care provider or pharmacist if you have questions. COMMON BRAND NAME(S): Keytruda What should I tell my health care provider before I take this medicine? They need to know if you have any of these conditions:  diabetes  immune system problems  inflammatory bowel disease  liver disease  lung or breathing disease  lupus  received or scheduled to receive an organ transplant or a stem-cell transplant that uses donor stem cells  an unusual or allergic reaction to pembrolizumab, other medicines, foods, dyes, or preservatives  pregnant or trying to get pregnant  breast-feeding How should I  use this medicine? This medicine is for infusion into a vein. It is given by a health care professional in a hospital or clinic setting. A special MedGuide will be given to you before each treatment. Be sure to read this information carefully each time. Talk to your pediatrician regarding the use of this medicine in children. While this drug may be prescribed for children as young as 6 months for selected conditions, precautions do apply. Overdosage: If you think you have taken too much of this medicine contact a poison control center or emergency room at once. NOTE: This medicine is only for you. Do not share this medicine with others. What if I miss a dose? It is important not to miss your dose. Call your doctor or health care professional if you are unable to keep an appointment. What may interact with this medicine? Interactions have not been studied. Give your health care provider a list of all the medicines, herbs, non-prescription drugs, or dietary supplements you use. Also tell them if you smoke, drink alcohol, or use illegal drugs. Some items may interact with your medicine. This list may not describe all possible interactions. Give your health care provider a list of all the medicines, herbs, non-prescription drugs, or dietary supplements you use. Also tell them if you smoke, drink alcohol, or use illegal drugs. Some items may interact with your medicine. What should I watch for while using this medicine? Your condition will be monitored carefully while you are receiving this medicine. You may need blood work done while you are taking this medicine. Do not become pregnant while taking this medicine or for 4 months  after stopping it. Women should inform their doctor if they wish to become pregnant or think they might be pregnant. There is a potential for serious side effects to an unborn child. Talk to your health care professional or pharmacist for more information. Do not breast-feed an  infant while taking this medicine or for 4 months after the last dose. What side effects may I notice from receiving this medicine? Side effects that you should report to your doctor or health care professional as soon as possible:  allergic reactions like skin rash, itching or hives, swelling of the face, lips, or tongue  bloody or black, tarry  breathing problems  changes in vision  chest pain  chills  confusion  constipation  cough  diarrhea  dizziness or feeling faint or lightheaded  fast or irregular heartbeat  fever  flushing  joint pain  low blood counts - this medicine may decrease the number of white blood cells, red blood cells and platelets. You may be at increased risk for infections and bleeding.  muscle pain  muscle weakness  pain, tingling, numbness in the hands or feet  persistent headache  redness, blistering, peeling or loosening of the skin, including inside the mouth  signs and symptoms of high blood sugar such as dizziness; dry mouth; dry skin; fruity breath; nausea; stomach pain; increased hunger or thirst; increased urination  signs and symptoms of kidney injury like trouble passing urine or change in the amount of urine  signs and symptoms of liver injury like dark urine, light-colored stools, loss of appetite, nausea, right upper belly pain, yellowing of the eyes or skin  sweating  swollen lymph nodes  weight loss Side effects that usually do not require medical attention (report to your doctor or health care professional if they continue or are bothersome):  decreased appetite  hair loss  muscle pain  tiredness This list may not describe all possible side effects. Call your doctor for medical advice about side effects. You may report side effects to FDA at 1-800-FDA-1088. Where should I keep my medicine? This drug is given in a hospital or clinic and will not be stored at home. NOTE: This sheet is a summary. It may not  cover all possible information. If you have questions about this medicine, talk to your doctor, pharmacist, or health care provider.  2020 Elsevier/Gold Standard (2019-05-08 18:07:58)  Paclitaxel injection What is this medicine? PACLITAXEL (PAK li TAX el) is a chemotherapy drug. It targets fast dividing cells, like cancer cells, and causes these cells to die. This medicine is used to treat ovarian cancer, breast cancer, lung cancer, Kaposi's sarcoma, and other cancers. This medicine may be used for other purposes; ask your health care provider or pharmacist if you have questions. COMMON BRAND NAME(S): Onxol, Taxol What should I tell my health care provider before I take this medicine? They need to know if you have any of these conditions:  history of irregular heartbeat  liver disease  low blood counts, like low white cell, platelet, or red cell counts  lung or breathing disease, like asthma  tingling of the fingers or toes, or other nerve disorder  an unusual or allergic reaction to paclitaxel, alcohol, polyoxyethylated castor oil, other chemotherapy, other medicines, foods, dyes, or preservatives  pregnant or trying to get pregnant  breast-feeding How should I use this medicine? This drug is given as an infusion into a vein. It is administered in a hospital or clinic by a specially trained health care  professional. Talk to your pediatrician regarding the use of this medicine in children. Special care may be needed. Overdosage: If you think you have taken too much of this medicine contact a poison control center or emergency room at once. NOTE: This medicine is only for you. Do not share this medicine with others. What if I miss a dose? It is important not to miss your dose. Call your doctor or health care professional if you are unable to keep an appointment. What may interact with this medicine? Do not take this medicine with any of the following  medications:  disulfiram  metronidazole This medicine may also interact with the following medications:  antiviral medicines for hepatitis, HIV or AIDS  certain antibiotics like erythromycin and clarithromycin  certain medicines for fungal infections like ketoconazole and itraconazole  certain medicines for seizures like carbamazepine, phenobarbital, phenytoin  gemfibrozil  nefazodone  rifampin  St. John's wort This list may not describe all possible interactions. Give your health care provider a list of all the medicines, herbs, non-prescription drugs, or dietary supplements you use. Also tell them if you smoke, drink alcohol, or use illegal drugs. Some items may interact with your medicine. What should I watch for while using this medicine? Your condition will be monitored carefully while you are receiving this medicine. You will need important blood work done while you are taking this medicine. This medicine can cause serious allergic reactions. To reduce your risk you will need to take other medicine(s) before treatment with this medicine. If you experience allergic reactions like skin rash, itching or hives, swelling of the face, lips, or tongue, tell your doctor or health care professional right away. In some cases, you may be given additional medicines to help with side effects. Follow all directions for their use. This drug may make you feel generally unwell. This is not uncommon, as chemotherapy can affect healthy cells as well as cancer cells. Report any side effects. Continue your course of treatment even though you feel ill unless your doctor tells you to stop. Call your doctor or health care professional for advice if you get a fever, chills or sore throat, or other symptoms of a cold or flu. Do not treat yourself. This drug decreases your body's ability to fight infections. Try to avoid being around people who are sick. This medicine may increase your risk to bruise or  bleed. Call your doctor or health care professional if you notice any unusual bleeding. Be careful brushing and flossing your teeth or using a toothpick because you may get an infection or bleed more easily. If you have any dental work done, tell your dentist you are receiving this medicine. Avoid taking products that contain aspirin, acetaminophen, ibuprofen, naproxen, or ketoprofen unless instructed by your doctor. These medicines may hide a fever. Do not become pregnant while taking this medicine. Women should inform their doctor if they wish to become pregnant or think they might be pregnant. There is a potential for serious side effects to an unborn child. Talk to your health care professional or pharmacist for more information. Do not breast-feed an infant while taking this medicine. Men are advised not to father a child while receiving this medicine. This product may contain alcohol. Ask your pharmacist or healthcare provider if this medicine contains alcohol. Be sure to tell all healthcare providers you are taking this medicine. Certain medicines, like metronidazole and disulfiram, can cause an unpleasant reaction when taken with alcohol. The reaction includes flushing, headache, nausea,  vomiting, sweating, and increased thirst. The reaction can last from 30 minutes to several hours. What side effects may I notice from receiving this medicine? Side effects that you should report to your doctor or health care professional as soon as possible:  allergic reactions like skin rash, itching or hives, swelling of the face, lips, or tongue  breathing problems  changes in vision  fast, irregular heartbeat  high or low blood pressure  mouth sores  pain, tingling, numbness in the hands or feet  signs of decreased platelets or bleeding - bruising, pinpoint red spots on the skin, black, tarry stools, blood in the urine  signs of decreased red blood cells - unusually weak or tired, feeling faint  or lightheaded, falls  signs of infection - fever or chills, cough, sore throat, pain or difficulty passing urine  signs and symptoms of liver injury like dark yellow or brown urine; general ill feeling or flu-like symptoms; light-colored stools; loss of appetite; nausea; right upper belly pain; unusually weak or tired; yellowing of the eyes or skin  swelling of the ankles, feet, hands  unusually slow heartbeat Side effects that usually do not require medical attention (report to your doctor or health care professional if they continue or are bothersome):  diarrhea  hair loss  loss of appetite  muscle or joint pain  nausea, vomiting  pain, redness, or irritation at site where injected  tiredness This list may not describe all possible side effects. Call your doctor for medical advice about side effects. You may report side effects to FDA at 1-800-FDA-1088. Where should I keep my medicine? This drug is given in a hospital or clinic and will not be stored at home. NOTE: This sheet is a summary. It may not cover all possible information. If you have questions about this medicine, talk to your doctor, pharmacist, or health care provider.  2020 Elsevier/Gold Standard (2017-03-05 13:14:55)  Carboplatin injection What is this medicine? CARBOPLATIN (KAR boe pla tin) is a chemotherapy drug. It targets fast dividing cells, like cancer cells, and causes these cells to die. This medicine is used to treat ovarian cancer and many other cancers. This medicine may be used for other purposes; ask your health care provider or pharmacist if you have questions. COMMON BRAND NAME(S): Paraplatin What should I tell my health care provider before I take this medicine? They need to know if you have any of these conditions:  blood disorders  hearing problems  kidney disease  recent or ongoing radiation therapy  an unusual or allergic reaction to carboplatin, cisplatin, other chemotherapy, other  medicines, foods, dyes, or preservatives  pregnant or trying to get pregnant  breast-feeding How should I use this medicine? This drug is usually given as an infusion into a vein. It is administered in a hospital or clinic by a specially trained health care professional. Talk to your pediatrician regarding the use of this medicine in children. Special care may be needed. Overdosage: If you think you have taken too much of this medicine contact a poison control center or emergency room at once. NOTE: This medicine is only for you. Do not share this medicine with others. What if I miss a dose? It is important not to miss a dose. Call your doctor or health care professional if you are unable to keep an appointment. What may interact with this medicine?  medicines for seizures  medicines to increase blood counts like filgrastim, pegfilgrastim, sargramostim  some antibiotics like amikacin, gentamicin, neomycin,  streptomycin, tobramycin  vaccines Talk to your doctor or health care professional before taking any of these medicines:  acetaminophen  aspirin  ibuprofen  ketoprofen  naproxen This list may not describe all possible interactions. Give your health care provider a list of all the medicines, herbs, non-prescription drugs, or dietary supplements you use. Also tell them if you smoke, drink alcohol, or use illegal drugs. Some items may interact with your medicine. What should I watch for while using this medicine? Your condition will be monitored carefully while you are receiving this medicine. You will need important blood work done while you are taking this medicine. This drug may make you feel generally unwell. This is not uncommon, as chemotherapy can affect healthy cells as well as cancer cells. Report any side effects. Continue your course of treatment even though you feel ill unless your doctor tells you to stop. In some cases, you may be given additional medicines to help  with side effects. Follow all directions for their use. Call your doctor or health care professional for advice if you get a fever, chills or sore throat, or other symptoms of a cold or flu. Do not treat yourself. This drug decreases your body's ability to fight infections. Try to avoid being around people who are sick. This medicine may increase your risk to bruise or bleed. Call your doctor or health care professional if you notice any unusual bleeding. Be careful brushing and flossing your teeth or using a toothpick because you may get an infection or bleed more easily. If you have any dental work done, tell your dentist you are receiving this medicine. Avoid taking products that contain aspirin, acetaminophen, ibuprofen, naproxen, or ketoprofen unless instructed by your doctor. These medicines may hide a fever. Do not become pregnant while taking this medicine. Women should inform their doctor if they wish to become pregnant or think they might be pregnant. There is a potential for serious side effects to an unborn child. Talk to your health care professional or pharmacist for more information. Do not breast-feed an infant while taking this medicine. What side effects may I notice from receiving this medicine? Side effects that you should report to your doctor or health care professional as soon as possible:  allergic reactions like skin rash, itching or hives, swelling of the face, lips, or tongue  signs of infection - fever or chills, cough, sore throat, pain or difficulty passing urine  signs of decreased platelets or bleeding - bruising, pinpoint red spots on the skin, black, tarry stools, nosebleeds  signs of decreased red blood cells - unusually weak or tired, fainting spells, lightheadedness  breathing problems  changes in hearing  changes in vision  chest pain  high blood pressure  low blood counts - This drug may decrease the number of white blood cells, red blood cells and  platelets. You may be at increased risk for infections and bleeding.  nausea and vomiting  pain, swelling, redness or irritation at the injection site  pain, tingling, numbness in the hands or feet  problems with balance, talking, walking  trouble passing urine or change in the amount of urine Side effects that usually do not require medical attention (report to your doctor or health care professional if they continue or are bothersome):  hair loss  loss of appetite  metallic taste in the mouth or changes in taste This list may not describe all possible side effects. Call your doctor for medical advice about side effects. You  may report side effects to FDA at 1-800-FDA-1088. Where should I keep my medicine? This drug is given in a hospital or clinic and will not be stored at home. NOTE: This sheet is a summary. It may not cover all possible information. If you have questions about this medicine, talk to your doctor, pharmacist, or health care provider.  2020 [employer]vier/Gold Standard (2007-10-07 14:38:05)

## 2019-12-16 NOTE — Progress Notes (Signed)
[facility] [phone]   [phone]  OFFICE PROGRESS NOTE  [name], MD [place] [address]  DIAGNOSIS:Recurrent non-small cell lung cancer likely squamous cell carcinoma that was initially diagnosed as a stage IIA(T2b, N0, M0) inAugust 2018status post curative stereotactic radiotherapy completed June 21, 2017. The patient presented today with concerning findings for disease recurrence and progression with enlarging and hypermetabolic right upper lobe lung mass presenting as stage IIIa (T3, N0, M0).  PDL 1 expression 0%.  PRIOR THERAPY: 1) Curative stereotactic radiotherapy completed on June 21, 2017. 2) A course of concurrent chemoradiation with chemotherapy consisting of carboplatin for an AUC of 2 and paclitaxel 45 mg/m.  First dose started on 06/23/2018.  Status post 7 cycles.  Last dose of chemotherapy was given August 04, 2018. 3) Consolidation immunotherapy with Imfinzi 10 mg/KG every 2 weeks.  First dose September 09, 2018.  Status post 26 cycles.   CURRENT THERAPY: Systemic chemotherapy with carboplatin for AUC of 5, paclitaxel 175 NG/M2 and Keytruda 200 mg IV every 3 weeks.  First dose Nov 25, 2019.  Status post 1 cycle.  INTERVAL HISTORY: [name] 79 y.o. female returns to the clinic today for follow-up visit.  The patient is feeling fine today with no concerning complaints.  She tolerated the first cycle of her treatment fairly well except for some aching pain after the Neulasta injection.  She started having some alopecia.  She denied having any chest pain, shortness of breath except with exertion with no cough or hemoptysis.  She has no nausea, vomiting, diarrhea or constipation.  She denied having any headache or visual changes.  The patient is here today for evaluation before starting cycle #2 of her treatment.  MEDICAL HISTORY: Past Medical History:  Diagnosis Date  . Cancer  ([place])    Lung   . Carotid artery occlusion   . Clostridium difficile infection 06/2013  . Diabetes mellitus    takes Janumet daily  . Dyslipidemia    takes Crestor daily  . Gallstones   . GERD (gastroesophageal reflux disease)   . Heart murmur    hx of  . History of radiation therapy 06/11/17-06/21/17   right lung 50 Gy in 5 fractions  . Hypertension    takes Prinzide and Verapamil daily  . Impaired speech    from stroke  . Neuropathy, diabetic ([place])   . Pneumonia   . PONV (postoperative nausea and vomiting)   . Seizures ([place]) 04/19/2018   recent hospitalization  . Smoker   . Stroke ([place]) 06/25/13  . Vertigo    but doesn't take any meds    ALLERGIES:  is allergic to codeine; benadryl [diphenhydramine]; lipitor [atorvastatin]; and phenergan [promethazine hcl].  MEDICATIONS:  Current Outpatient Medications  Medication Sig Dispense Refill  . aspirin EC 81 MG tablet Take 81 mg by mouth daily.    . Blood Glucose Monitoring Suppl (ONE TOUCH ULTRA 2) w/Device KIT 1 Device by Does not apply route 2 (two) times daily. 1 each 0  . cholecalciferol (VITAMIN D3) 25 MCG (1000 UNIT) tablet Take 1,000 Units by mouth daily.    . divalproex (DEPAKOTE ER) 500 MG 24 hr tablet Take one tablet  twice daily 60 tablet 6  . glucose blood (ONE TOUCH ULTRA TEST) test strip Use as instructed 100 each 12  . Lancets (ONETOUCH ULTRASOFT) lancets Use as instructed 100 each 12  . lidocaine-prilocaine (EMLA) cream APPLY TO THE  AFFECTED AREA DAILY AS NEEDED (Patient taking differently: Apply 1 application topically daily as needed (Pain). ) 30 g 0  . lisinopril (ZESTRIL) 10 MG tablet TAKE 1 TABLET(10 MG) BY MOUTH DAILY 90 tablet 3  . Multiple Vitamin (MULTIVITAMIN) tablet Take 1 tablet by mouth daily.    . Multiple Vitamins-Minerals (MULTIVITAMIN PO) Take 1 tablet by mouth daily.    . pioglitazone (ACTOS) 30 MG tablet TAKE 1 TABLET(30 MG) BY MOUTH DAILY 90 tablet 1  . prochlorperazine (COMPAZINE) 10 MG  tablet Take 1 tablet (10 mg total) by mouth every 6 (six) hours as needed for nausea or vomiting. (Patient not taking: Reported on 08/25/2019) 30 tablet 0  . rosuvastatin (CRESTOR) 20 MG tablet TAKE 1 TABLET BY MOUTH ONCE DAILY 180 tablet 0  . SitaGLIPtin-MetFORMIN HCl (JANUMET XR) 50-1000 MG TB24 Take 1 tablet by mouth daily. 60 tablet 5  . vitamin B-12 (CYANOCOBALAMIN) 1000 MCG tablet Take 1,000 mcg by mouth daily.     No current facility-administered medications for this visit.   Facility-Administered Medications Ordered in Other Visits  Medication Dose Route Frequency Provider Last Rate Last Admin  . heparin lock flush 100 unit/mL  500 Units Intracatheter Once PRN Curt Bears, MD      . sodium chloride flush (NS) 0.9 % injection 10 mL  10 mL Intracatheter PRN Curt Bears, MD        SURGICAL HISTORY:  Past Surgical History:  Procedure Laterality Date  . cataract removed Right   . CHOLECYSTECTOMY    . COLONOSCOPY    . ENDARTERECTOMY Left 07/14/2013   Procedure: ENDARTERECTOMY CAROTID-LEFT;  Surgeon: Elam Dutch, MD;  Location: [facility];  Service: Vascular;  Laterality: Left;  . EYE SURGERY Left    cataract  . IR CV LINE INJECTION  05/13/2019  . IR IMAGING GUIDED PORT INSERTION  09/15/2018  . IR PATIENT EVAL TECH 0-60 MINS  05/08/2019  . IR PERC PLEURAL DRAIN W/INDWELL CATH W/IMG GUIDE  05/23/2017  . KNEE SURGERY Left 79yr ago  . LAPAROSCOPIC PARTIAL COLECTOMY N/A 09/02/2015   Procedure: LAPAROSCOPIC PARTIAL RIGHT COLECTOMY;  Surgeon: ALeighton Ruff MD;  Location: [facility];  Service: General;  Laterality: N/A;  . PATCH ANGIOPLASTY Left 07/14/2013   Procedure: LEFT CAROTID ARTERY PATCH ANGIOPLASTY;  Surgeon: CElam Dutch MD;  [facility]  Service: Vascular;  Laterality: Left;  .Marland KitchenVIDEO BRONCHOSCOPY N/A 06/04/2018   Procedure: VIDEO BRONCHOSCOPY;  Surgeon: HMelrose Nakayama MD;  [facility];  Service: Thoracic;  Laterality: N/A;  . wisdom      REVIEW OF  SYSTEMS:  A comprehensive review of systems was negative except for: Constitutional: positive for fatigue Respiratory: positive for dyspnea on exertion   PHYSICAL EXAMINATION: General appearance: alert, cooperative, fatigued and no distress Head: Normocephalic, without obvious abnormality, atraumatic Neck: no adenopathy, no JVD, supple, symmetrical, trachea midline and thyroid not enlarged, symmetric, no tenderness/mass/nodules Lymph nodes: Cervical, supraclavicular, and axillary nodes normal. Resp: clear to auscultation bilaterally Back: symmetric, no curvature. ROM normal. No CVA tenderness. Cardio: regular rate and rhythm, S1, S2 normal, no murmur, click, rub or gallop GI: soft, non-tender; bowel sounds normal; no masses,  no organomegaly Extremities: extremities normal, atraumatic, no cyanosis or edema  ECOG PERFORMANCE STATUS: 1 - Symptomatic but completely ambulatory  Blood pressure (!) 136/55, pulse 100, temperature 98.2 F (36.8 C), temperature source Temporal, resp. rate 20, height '5\' 6"'  (1.676 m), weight 130 lb 11.2 oz (59.3 kg), SpO2 99 %.  LABORATORY  DATA: Lab Results  Component Value Date   WBC 22.2 (H) 12/09/2019   HGB 9.7 (L) 12/09/2019   HCT 30.7 (L) 12/09/2019   MCV 86.7 12/09/2019   PLT 422 (H) 12/09/2019      Chemistry      Component Value Date/Time   NA 140 12/09/2019 1514   NA 139 05/07/2018 1147   K 3.4 (L) 12/09/2019 1514   CL 101 12/09/2019 1514   CO2 30 12/09/2019 1514   BUN 9 12/09/2019 1514   BUN 15 05/07/2018 1147   CREATININE 0.83 12/09/2019 1514   CREATININE 0.87 03/08/2015 0001      Component Value Date/Time   CALCIUM 9.1 12/09/2019 1514   ALKPHOS 121 12/09/2019 1514   AST 11 (L) 12/09/2019 1514   ALT 6 12/09/2019 1514   BILITOT 0.4 12/09/2019 1514       RADIOGRAPHIC STUDIES: NM PET Image Restag (PS) Skull Base To Thigh  Result Date: 11/17/2019 CLINICAL DATA:  Subsequent treatment strategy for non-small cell lung cancer. Concern for  recurrence. Chemotherapy completed 2 months prior. COVID vaccine LEFT arm. EXAM: NUCLEAR MEDICINE PET SKULL BASE TO THIGH TECHNIQUE: 6.8 mCi F-18 FDG was injected intravenously. Full-ring PET imaging was performed from the skull base to thigh after the radiotracer. CT data was obtained and used for attenuation correction and anatomic localization. Fasting blood glucose: 156 mg/dl COMPARISON:  PET-CT 05/07/2018, chest CT 11/02/2019 FINDINGS: Mediastinal blood pool activity: SUV max 2.3 Liver activity: SUV max NA NECK: No hypermetabolic lymph nodes in the neck. Incidental CT findings: none CHEST: Hypermetabolic RIGHT upper lobe mass is again demonstrated measuring approximately 5.0 by 5.0 cm with intense peripheral metabolic activity (SUV max equal 17.6). The size and activity is similar to comparison PET-CT scan with a 5 cm mass measuring SUV max equal 11.9 on 05/07/2018. However, there is a new hypermetabolic precarinal lymph node in comparison to PET-CT scan. This lymph node measures 10 mm (image 62/5) with SUV max equal 7.3. Additionally there is hypermetabolic LEFT infrahilar tissue with SUV max equal 12.1. This infrahilar tissue measures 2.4 by 1.9 cm on image 71/4. This tissue is new from comparison PET-CT scan. Incidental CT findings: New RIGHT effusion compared to remote PET-CT scan but similar to most recent CT scan. ABDOMEN/PELVIS: No abnormal hypermetabolic activity within the liver, pancreas, adrenal glands, or spleen. No hypermetabolic lymph nodes in the abdomen or pelvis. Incidental CT findings: none SKELETON: No focal hypermetabolic activity to suggest skeletal metastasis. Incidental CT findings: none IMPRESSION: 1. Compared to PET-CT scan 05/07/2018, there is a new hypermetabolic precarinal metastatic lymph node and new hypermetabolic LEFT infrahilar thickened tissue consistent with metastatic disease. This new metastatic disease was characterized on recent diagnostic CT 11/02/2019. 2. Essentially  stable RIGHT upper lobe hypermetabolic mass. 3. No evidence of distant metastatic disease outside the thorax. 4. Moderate RIGHT pleural effusions similar prior. Electronically Signed   By: Suzy Bouchard M.D.   On: 11/17/2019 09:52    ASSESSMENT AND PLAN: This is a very pleasant 79 years old white female with recurrent non-small cell lung cancer presented as a stage IIIa involving the right lower lobe. The patient underwent a course of concurrent chemoradiation with weekly carboplatin and paclitaxel status post 7 cycles.  She tolerated this treatment well. The patient completed a course of consolidation immunotherapy with Imfinzi status post 26 cycles.  She tolerated her treatment well with no concerning complaints. The patient was on observation for several months. Imaging studies including repeat CT scan  of the chest as well as a PET scan that showed evidence for disease recurrence in the right lung and metastasis to the left lung. The patient is started systemic chemotherapy with carboplatin for AUC of 5, paclitaxel 175 mg/M2 and Keytruda 200 mg IV every 3 weeks status post 1 cycle.  She tolerated the first cycle of her treatment well except for the aching pain from the Neulasta injection.  She also has alopecia. I recommended for her to proceed with cycle #2 today as planned. She will come back for follow-up visit in 3 weeks for evaluation before starting cycle #3 of her treatment. The patient was advised to call immediately if she has any concerning symptoms in the interval. The patient voices understanding of current disease status and treatment options and is in agreement with the current care plan. All questions were answered. The patient knows to call the clinic with any problems, questions or concerns. We can certainly see the patient much sooner if necessary.  Disclaimer: This note was dictated with voice recognition software. Similar sounding words can inadvertently be transcribed and  may not be corrected upon review.

## 2019-12-17 ENCOUNTER — Telehealth: Payer: Self-pay | Admitting: Internal Medicine

## 2019-12-17 NOTE — Telephone Encounter (Signed)
Scheduled appt per 6/2 los - pt to get an updated schedule next visit.   

## 2019-12-18 ENCOUNTER — Other Ambulatory Visit: Payer: Self-pay

## 2019-12-18 ENCOUNTER — Inpatient Hospital Stay: Payer: Medicare Other

## 2019-12-18 VITALS — BP 139/56 | HR 94 | Temp 98.7°F | Resp 16

## 2019-12-18 DIAGNOSIS — Z79899 Other long term (current) drug therapy: Secondary | ICD-10-CM | POA: Diagnosis not present

## 2019-12-18 DIAGNOSIS — Z923 Personal history of irradiation: Secondary | ICD-10-CM | POA: Diagnosis not present

## 2019-12-18 DIAGNOSIS — Z5112 Encounter for antineoplastic immunotherapy: Secondary | ICD-10-CM | POA: Diagnosis not present

## 2019-12-18 DIAGNOSIS — C3491 Malignant neoplasm of unspecified part of right bronchus or lung: Secondary | ICD-10-CM

## 2019-12-18 DIAGNOSIS — Z7982 Long term (current) use of aspirin: Secondary | ICD-10-CM | POA: Diagnosis not present

## 2019-12-18 DIAGNOSIS — Z5111 Encounter for antineoplastic chemotherapy: Secondary | ICD-10-CM | POA: Diagnosis not present

## 2019-12-18 DIAGNOSIS — Z9221 Personal history of antineoplastic chemotherapy: Secondary | ICD-10-CM | POA: Diagnosis not present

## 2019-12-18 DIAGNOSIS — Z5189 Encounter for other specified aftercare: Secondary | ICD-10-CM | POA: Diagnosis not present

## 2019-12-18 DIAGNOSIS — I10 Essential (primary) hypertension: Secondary | ICD-10-CM | POA: Diagnosis not present

## 2019-12-18 DIAGNOSIS — C3411 Malignant neoplasm of upper lobe, right bronchus or lung: Secondary | ICD-10-CM | POA: Diagnosis not present

## 2019-12-18 DIAGNOSIS — E119 Type 2 diabetes mellitus without complications: Secondary | ICD-10-CM | POA: Diagnosis not present

## 2019-12-18 MED ORDER — PEGFILGRASTIM-CBQV 6 MG/0.6ML ~~LOC~~ SOSY
PREFILLED_SYRINGE | SUBCUTANEOUS | Status: AC
  Filled 2019-12-18: qty 0.6

## 2019-12-18 MED ORDER — PEGFILGRASTIM-CBQV 6 MG/0.6ML ~~LOC~~ SOSY
6.0000 mg | PREFILLED_SYRINGE | Freq: Once | SUBCUTANEOUS | Status: AC
  Administered 2019-12-18: 6 mg via SUBCUTANEOUS

## 2019-12-18 NOTE — Patient Instructions (Signed)

## 2019-12-23 ENCOUNTER — Other Ambulatory Visit: Payer: Self-pay

## 2019-12-23 ENCOUNTER — Inpatient Hospital Stay: Payer: Medicare Other

## 2019-12-23 DIAGNOSIS — I10 Essential (primary) hypertension: Secondary | ICD-10-CM | POA: Diagnosis not present

## 2019-12-23 DIAGNOSIS — Z5112 Encounter for antineoplastic immunotherapy: Secondary | ICD-10-CM | POA: Diagnosis not present

## 2019-12-23 DIAGNOSIS — E119 Type 2 diabetes mellitus without complications: Secondary | ICD-10-CM | POA: Diagnosis not present

## 2019-12-23 DIAGNOSIS — Z95828 Presence of other vascular implants and grafts: Secondary | ICD-10-CM

## 2019-12-23 DIAGNOSIS — Z9221 Personal history of antineoplastic chemotherapy: Secondary | ICD-10-CM | POA: Diagnosis not present

## 2019-12-23 DIAGNOSIS — Z79899 Other long term (current) drug therapy: Secondary | ICD-10-CM | POA: Diagnosis not present

## 2019-12-23 DIAGNOSIS — C3491 Malignant neoplasm of unspecified part of right bronchus or lung: Secondary | ICD-10-CM

## 2019-12-23 DIAGNOSIS — Z5111 Encounter for antineoplastic chemotherapy: Secondary | ICD-10-CM | POA: Diagnosis not present

## 2019-12-23 DIAGNOSIS — Z5189 Encounter for other specified aftercare: Secondary | ICD-10-CM | POA: Diagnosis not present

## 2019-12-23 DIAGNOSIS — C3411 Malignant neoplasm of upper lobe, right bronchus or lung: Secondary | ICD-10-CM | POA: Diagnosis not present

## 2019-12-23 DIAGNOSIS — Z7982 Long term (current) use of aspirin: Secondary | ICD-10-CM | POA: Diagnosis not present

## 2019-12-23 DIAGNOSIS — Z923 Personal history of irradiation: Secondary | ICD-10-CM | POA: Diagnosis not present

## 2019-12-23 LAB — CMP (CANCER CENTER ONLY)
ALT: 8 U/L (ref 0–44)
AST: 11 U/L — ABNORMAL LOW (ref 15–41)
Albumin: 2.8 g/dL — ABNORMAL LOW (ref 3.5–5.0)
Alkaline Phosphatase: 201 U/L — ABNORMAL HIGH (ref 38–126)
Anion gap: 14 (ref 5–15)
BUN: 11 mg/dL (ref 8–23)
CO2: 28 mmol/L (ref 22–32)
Calcium: 9.5 mg/dL (ref 8.9–10.3)
Chloride: 101 mmol/L (ref 98–111)
Creatinine: 0.76 mg/dL (ref 0.44–1.00)
GFR, Est AFR Am: >60 mL/min (ref >60)
GFR, Estimated: >60 mL/min (ref >60)
Glucose, Bld: 151 mg/dL — ABNORMAL HIGH (ref 70–99)
Potassium: 3.6 mmol/L (ref 3.5–5.1)
Sodium: 143 mmol/L (ref 135–145)
Total Bilirubin: <0.2 mg/dL — ABNORMAL LOW (ref 0.3–1.2)
Total Protein: 6.2 g/dL — ABNORMAL LOW (ref 6.5–8.1)

## 2019-12-23 LAB — CBC WITH DIFFERENTIAL (CANCER CENTER ONLY)
Abs Immature Granulocytes: 0.8 10*3/uL — ABNORMAL HIGH (ref 0.00–0.07)
Basophils Absolute: 0.1 10*3/uL (ref 0.0–0.1)
Basophils Relative: 0 %
Eosinophils Absolute: 0.1 10*3/uL (ref 0.0–0.5)
Eosinophils Relative: 0 %
HCT: 29.2 % — ABNORMAL LOW (ref 36.0–46.0)
Hemoglobin: 9.1 g/dL — ABNORMAL LOW (ref 12.0–15.0)
Immature Granulocytes: 2 %
Lymphocytes Relative: 4 %
Lymphs Abs: 1.9 10*3/uL (ref 0.7–4.0)
MCH: 27.6 pg (ref 26.0–34.0)
MCHC: 31.2 g/dL (ref 30.0–36.0)
MCV: 88.5 fL (ref 80.0–100.0)
Monocytes Absolute: 2.1 10*3/uL — ABNORMAL HIGH (ref 0.1–1.0)
Monocytes Relative: 5 %
Neutro Abs: 41.1 10*3/uL — ABNORMAL HIGH (ref 1.7–7.7)
Neutrophils Relative %: 89 %
Platelet Count: 514 10*3/uL — ABNORMAL HIGH (ref 150–400)
RBC: 3.3 MIL/uL — ABNORMAL LOW (ref 3.87–5.11)
RDW: 16.9 % — ABNORMAL HIGH (ref 11.5–15.5)
WBC Count: 46.1 10*3/uL — ABNORMAL HIGH (ref 4.0–10.5)
nRBC: 0 % (ref 0.0–0.2)

## 2019-12-23 MED ORDER — HEPARIN SOD (PORK) LOCK FLUSH 100 UNIT/ML IV SOLN
500.0000 [IU] | Freq: Once | INTRAVENOUS | Status: DC | PRN
  Filled 2019-12-23: qty 5

## 2019-12-23 MED ORDER — SODIUM CHLORIDE 0.9% FLUSH
10.0000 mL | INTRAVENOUS | Status: DC | PRN
  Filled 2019-12-23: qty 10

## 2019-12-23 NOTE — Patient Instructions (Signed)

## 2019-12-23 NOTE — Progress Notes (Signed)
Drew labs from arm. Didn't need a port flush today.

## 2019-12-30 ENCOUNTER — Inpatient Hospital Stay: Payer: Medicare Other

## 2019-12-30 ENCOUNTER — Other Ambulatory Visit: Payer: Self-pay

## 2019-12-30 DIAGNOSIS — I10 Essential (primary) hypertension: Secondary | ICD-10-CM | POA: Diagnosis not present

## 2019-12-30 DIAGNOSIS — Z5112 Encounter for antineoplastic immunotherapy: Secondary | ICD-10-CM | POA: Diagnosis not present

## 2019-12-30 DIAGNOSIS — Z79899 Other long term (current) drug therapy: Secondary | ICD-10-CM | POA: Diagnosis not present

## 2019-12-30 DIAGNOSIS — Z5111 Encounter for antineoplastic chemotherapy: Secondary | ICD-10-CM | POA: Diagnosis not present

## 2019-12-30 DIAGNOSIS — Z5189 Encounter for other specified aftercare: Secondary | ICD-10-CM | POA: Diagnosis not present

## 2019-12-30 DIAGNOSIS — C3491 Malignant neoplasm of unspecified part of right bronchus or lung: Secondary | ICD-10-CM

## 2019-12-30 DIAGNOSIS — Z9221 Personal history of antineoplastic chemotherapy: Secondary | ICD-10-CM | POA: Diagnosis not present

## 2019-12-30 DIAGNOSIS — Z7982 Long term (current) use of aspirin: Secondary | ICD-10-CM | POA: Diagnosis not present

## 2019-12-30 DIAGNOSIS — E119 Type 2 diabetes mellitus without complications: Secondary | ICD-10-CM | POA: Diagnosis not present

## 2019-12-30 DIAGNOSIS — Z923 Personal history of irradiation: Secondary | ICD-10-CM | POA: Diagnosis not present

## 2019-12-30 DIAGNOSIS — C3411 Malignant neoplasm of upper lobe, right bronchus or lung: Secondary | ICD-10-CM | POA: Diagnosis not present

## 2019-12-30 LAB — CBC WITH DIFFERENTIAL (CANCER CENTER ONLY)
Abs Immature Granulocytes: 0.67 10*3/uL — ABNORMAL HIGH (ref 0.00–0.07)
Basophils Absolute: 0.1 10*3/uL (ref 0.0–0.1)
Basophils Relative: 0 %
Eosinophils Absolute: 0.1 10*3/uL (ref 0.0–0.5)
Eosinophils Relative: 0 %
HCT: 28 % — ABNORMAL LOW (ref 36.0–46.0)
Hemoglobin: 9 g/dL — ABNORMAL LOW (ref 12.0–15.0)
Immature Granulocytes: 2 %
Lymphocytes Relative: 5 %
Lymphs Abs: 1.6 10*3/uL (ref 0.7–4.0)
MCH: 27.5 pg (ref 26.0–34.0)
MCHC: 32.1 g/dL (ref 30.0–36.0)
MCV: 85.6 fL (ref 80.0–100.0)
Monocytes Absolute: 1.2 10*3/uL — ABNORMAL HIGH (ref 0.1–1.0)
Monocytes Relative: 4 %
Neutro Abs: 29.2 10*3/uL — ABNORMAL HIGH (ref 1.7–7.7)
Neutrophils Relative %: 89 %
Platelet Count: 366 10*3/uL (ref 150–400)
RBC: 3.27 MIL/uL — ABNORMAL LOW (ref 3.87–5.11)
RDW: 17.8 % — ABNORMAL HIGH (ref 11.5–15.5)
WBC Count: 32.8 10*3/uL — ABNORMAL HIGH (ref 4.0–10.5)
nRBC: 0 % (ref 0.0–0.2)

## 2019-12-30 LAB — CMP (CANCER CENTER ONLY)
ALT: <6 U/L (ref 0–44)
AST: 11 U/L — ABNORMAL LOW (ref 15–41)
Albumin: 2.7 g/dL — ABNORMAL LOW (ref 3.5–5.0)
Alkaline Phosphatase: 130 U/L — ABNORMAL HIGH (ref 38–126)
Anion gap: 8 (ref 5–15)
BUN: 10 mg/dL (ref 8–23)
CO2: 28 mmol/L (ref 22–32)
Calcium: 9 mg/dL (ref 8.9–10.3)
Chloride: 102 mmol/L (ref 98–111)
Creatinine: 0.76 mg/dL (ref 0.44–1.00)
GFR, Est AFR Am: >60 mL/min (ref >60)
GFR, Estimated: >60 mL/min (ref >60)
Glucose, Bld: 129 mg/dL — ABNORMAL HIGH (ref 70–99)
Potassium: 4.2 mmol/L (ref 3.5–5.1)
Sodium: 138 mmol/L (ref 135–145)
Total Bilirubin: 0.3 mg/dL (ref 0.3–1.2)
Total Protein: 6.4 g/dL — ABNORMAL LOW (ref 6.5–8.1)

## 2020-01-05 ENCOUNTER — Other Ambulatory Visit: Payer: Self-pay | Admitting: Family Medicine

## 2020-01-05 DIAGNOSIS — E1169 Type 2 diabetes mellitus with other specified complication: Secondary | ICD-10-CM

## 2020-01-05 DIAGNOSIS — E785 Hyperlipidemia, unspecified: Secondary | ICD-10-CM

## 2020-01-06 ENCOUNTER — Inpatient Hospital Stay: Payer: Medicare Other

## 2020-01-06 ENCOUNTER — Other Ambulatory Visit: Payer: Self-pay

## 2020-01-06 ENCOUNTER — Encounter: Payer: Self-pay | Admitting: Internal Medicine

## 2020-01-06 ENCOUNTER — Inpatient Hospital Stay (HOSPITAL_BASED_OUTPATIENT_CLINIC_OR_DEPARTMENT_OTHER): Payer: Medicare Other | Admitting: Internal Medicine

## 2020-01-06 VITALS — BP 166/71 | HR 92 | Temp 97.5°F | Resp 18 | Ht 66.0 in | Wt 127.1 lb

## 2020-01-06 DIAGNOSIS — Z923 Personal history of irradiation: Secondary | ICD-10-CM | POA: Diagnosis not present

## 2020-01-06 DIAGNOSIS — C3490 Malignant neoplasm of unspecified part of unspecified bronchus or lung: Secondary | ICD-10-CM | POA: Diagnosis not present

## 2020-01-06 DIAGNOSIS — Z5111 Encounter for antineoplastic chemotherapy: Secondary | ICD-10-CM

## 2020-01-06 DIAGNOSIS — C3411 Malignant neoplasm of upper lobe, right bronchus or lung: Secondary | ICD-10-CM | POA: Diagnosis not present

## 2020-01-06 DIAGNOSIS — Z5112 Encounter for antineoplastic immunotherapy: Secondary | ICD-10-CM

## 2020-01-06 DIAGNOSIS — C3491 Malignant neoplasm of unspecified part of right bronchus or lung: Secondary | ICD-10-CM

## 2020-01-06 DIAGNOSIS — E119 Type 2 diabetes mellitus without complications: Secondary | ICD-10-CM | POA: Diagnosis not present

## 2020-01-06 DIAGNOSIS — Z9221 Personal history of antineoplastic chemotherapy: Secondary | ICD-10-CM | POA: Diagnosis not present

## 2020-01-06 DIAGNOSIS — Z7982 Long term (current) use of aspirin: Secondary | ICD-10-CM | POA: Diagnosis not present

## 2020-01-06 DIAGNOSIS — Z5189 Encounter for other specified aftercare: Secondary | ICD-10-CM | POA: Diagnosis not present

## 2020-01-06 DIAGNOSIS — I10 Essential (primary) hypertension: Secondary | ICD-10-CM | POA: Diagnosis not present

## 2020-01-06 DIAGNOSIS — Z79899 Other long term (current) drug therapy: Secondary | ICD-10-CM | POA: Diagnosis not present

## 2020-01-06 LAB — CBC WITH DIFFERENTIAL (CANCER CENTER ONLY)
Abs Immature Granulocytes: 0.07 10*3/uL (ref 0.00–0.07)
Basophils Absolute: 0.1 10*3/uL (ref 0.0–0.1)
Basophils Relative: 0 %
Eosinophils Absolute: 0.1 10*3/uL (ref 0.0–0.5)
Eosinophils Relative: 1 %
HCT: 32.5 % — ABNORMAL LOW (ref 36.0–46.0)
Hemoglobin: 10.2 g/dL — ABNORMAL LOW (ref 12.0–15.0)
Immature Granulocytes: 0 %
Lymphocytes Relative: 7 %
Lymphs Abs: 1.1 10*3/uL (ref 0.7–4.0)
MCH: 27.7 pg (ref 26.0–34.0)
MCHC: 31.4 g/dL (ref 30.0–36.0)
MCV: 88.3 fL (ref 80.0–100.0)
Monocytes Absolute: 0.9 10*3/uL (ref 0.1–1.0)
Monocytes Relative: 6 %
Neutro Abs: 13.6 10*3/uL — ABNORMAL HIGH (ref 1.7–7.7)
Neutrophils Relative %: 86 %
Platelet Count: 508 10*3/uL — ABNORMAL HIGH (ref 150–400)
RBC: 3.68 MIL/uL — ABNORMAL LOW (ref 3.87–5.11)
RDW: 18.6 % — ABNORMAL HIGH (ref 11.5–15.5)
WBC Count: 15.8 10*3/uL — ABNORMAL HIGH (ref 4.0–10.5)
nRBC: 0 % (ref 0.0–0.2)

## 2020-01-06 LAB — CMP (CANCER CENTER ONLY)
ALT: 8 U/L (ref 0–44)
AST: 9 U/L — ABNORMAL LOW (ref 15–41)
Albumin: 2.8 g/dL — ABNORMAL LOW (ref 3.5–5.0)
Alkaline Phosphatase: 100 U/L (ref 38–126)
Anion gap: 8 (ref 5–15)
BUN: 12 mg/dL (ref 8–23)
CO2: 28 mmol/L (ref 22–32)
Calcium: 9.8 mg/dL (ref 8.9–10.3)
Chloride: 104 mmol/L (ref 98–111)
Creatinine: 0.82 mg/dL (ref 0.44–1.00)
GFR, Est AFR Am: >60 mL/min (ref >60)
GFR, Estimated: >60 mL/min (ref >60)
Glucose, Bld: 160 mg/dL — ABNORMAL HIGH (ref 70–99)
Potassium: 4.6 mmol/L (ref 3.5–5.1)
Sodium: 140 mmol/L (ref 135–145)
Total Bilirubin: 0.3 mg/dL (ref 0.3–1.2)
Total Protein: 6.9 g/dL (ref 6.5–8.1)

## 2020-01-06 LAB — TSH: TSH: 2.412 u[IU]/mL (ref 0.308–3.960)

## 2020-01-06 MED ORDER — FAMOTIDINE IN NACL 20-0.9 MG/50ML-% IV SOLN
INTRAVENOUS | Status: AC
  Filled 2020-01-06: qty 50

## 2020-01-06 MED ORDER — SODIUM CHLORIDE 0.9 % IV SOLN
175.0000 mg/m2 | Freq: Once | INTRAVENOUS | Status: AC
  Administered 2020-01-06: 294 mg via INTRAVENOUS
  Filled 2020-01-06: qty 49

## 2020-01-06 MED ORDER — DIPHENHYDRAMINE HCL 50 MG/ML IJ SOLN
INTRAMUSCULAR | Status: AC
  Filled 2020-01-06: qty 1

## 2020-01-06 MED ORDER — DIPHENHYDRAMINE HCL 50 MG/ML IJ SOLN
25.0000 mg | Freq: Once | INTRAMUSCULAR | Status: AC
  Administered 2020-01-06: 25 mg via INTRAVENOUS

## 2020-01-06 MED ORDER — SODIUM CHLORIDE 0.9 % IV SOLN
150.0000 mg | Freq: Once | INTRAVENOUS | Status: AC
  Administered 2020-01-06: 150 mg via INTRAVENOUS
  Filled 2020-01-06: qty 150

## 2020-01-06 MED ORDER — SODIUM CHLORIDE 0.9 % IV SOLN
10.0000 mg | Freq: Once | INTRAVENOUS | Status: AC
  Administered 2020-01-06: 10 mg via INTRAVENOUS
  Filled 2020-01-06: qty 10

## 2020-01-06 MED ORDER — PALONOSETRON HCL INJECTION 0.25 MG/5ML
0.2500 mg | Freq: Once | INTRAVENOUS | Status: AC
  Administered 2020-01-06: 0.25 mg via INTRAVENOUS

## 2020-01-06 MED ORDER — SODIUM CHLORIDE 0.9 % IV SOLN
346.0000 mg | Freq: Once | INTRAVENOUS | Status: AC
  Administered 2020-01-06: 350 mg via INTRAVENOUS
  Filled 2020-01-06: qty 35

## 2020-01-06 MED ORDER — SODIUM CHLORIDE 0.9 % IV SOLN
200.0000 mg | Freq: Once | INTRAVENOUS | Status: AC
  Administered 2020-01-06: 200 mg via INTRAVENOUS
  Filled 2020-01-06: qty 8

## 2020-01-06 MED ORDER — FAMOTIDINE IN NACL 20-0.9 MG/50ML-% IV SOLN
20.0000 mg | Freq: Once | INTRAVENOUS | Status: AC
  Administered 2020-01-06: 20 mg via INTRAVENOUS

## 2020-01-06 MED ORDER — PALONOSETRON HCL INJECTION 0.25 MG/5ML
INTRAVENOUS | Status: AC
  Filled 2020-01-06: qty 5

## 2020-01-06 MED ORDER — SODIUM CHLORIDE 0.9 % IV SOLN
Freq: Once | INTRAVENOUS | Status: AC
  Filled 2020-01-06: qty 250

## 2020-01-06 NOTE — Patient Instructions (Signed)
[facility] Discharge Instructions for Patients Receiving Chemotherapy  Today you received the following chemotherapy agents: Keytruda, Taxol, Carboplatin  To help prevent nausea and vomiting after your treatment, we encourage you to take your nausea medication as prescribed by your physician.   If you develop nausea and vomiting that is not controlled by your nausea medication, call the clinic.   BELOW ARE SYMPTOMS THAT SHOULD BE REPORTED IMMEDIATELY:  *FEVER GREATER THAN 100.5 F  *CHILLS WITH OR WITHOUT FEVER  NAUSEA AND VOMITING THAT IS NOT CONTROLLED WITH YOUR NAUSEA MEDICATION  *UNUSUAL SHORTNESS OF BREATH  *UNUSUAL BRUISING OR BLEEDING  TENDERNESS IN MOUTH AND THROAT WITH OR WITHOUT PRESENCE OF ULCERS  *URINARY PROBLEMS  *BOWEL PROBLEMS  UNUSUAL RASH Items with * indicate a potential emergency and should be followed up as soon as possible.  Feel free to call the clinic should you have any questions or concerns. The clinic phone number is [phone].  Please show the [facility] at check-in to the Emergency Department and triage nurse.  Pembrolizumab injection What is this medicine? PEMBROLIZUMAB (pem broe liz ue mab) is a monoclonal antibody. It is used to treat certain types of cancer. This medicine may be used for other purposes; ask your health care provider or pharmacist if you have questions. COMMON BRAND NAME(S): Keytruda What should I tell my health care provider before I take this medicine? They need to know if you have any of these conditions:  diabetes  immune system problems  inflammatory bowel disease  liver disease  lung or breathing disease  lupus  received or scheduled to receive an organ transplant or a stem-cell transplant that uses donor stem cells  an unusual or allergic reaction to pembrolizumab, other medicines, foods, dyes, or preservatives  pregnant or trying to get pregnant  breast-feeding How should I  use this medicine? This medicine is for infusion into a vein. It is given by a health care professional in a hospital or clinic setting. A special MedGuide will be given to you before each treatment. Be sure to read this information carefully each time. Talk to your pediatrician regarding the use of this medicine in children. While this drug may be prescribed for children as young as 6 months for selected conditions, precautions do apply. Overdosage: If you think you have taken too much of this medicine contact a poison control center or emergency room at once. NOTE: This medicine is only for you. Do not share this medicine with others. What if I miss a dose? It is important not to miss your dose. Call your doctor or health care professional if you are unable to keep an appointment. What may interact with this medicine? Interactions have not been studied. Give your health care provider a list of all the medicines, herbs, non-prescription drugs, or dietary supplements you use. Also tell them if you smoke, drink alcohol, or use illegal drugs. Some items may interact with your medicine. This list may not describe all possible interactions. Give your health care provider a list of all the medicines, herbs, non-prescription drugs, or dietary supplements you use. Also tell them if you smoke, drink alcohol, or use illegal drugs. Some items may interact with your medicine. What should I watch for while using this medicine? Your condition will be monitored carefully while you are receiving this medicine. You may need blood work done while you are taking this medicine. Do not become pregnant while taking this medicine or for 4 months  after stopping it. Women should inform their doctor if they wish to become pregnant or think they might be pregnant. There is a potential for serious side effects to an unborn child. Talk to your health care professional or pharmacist for more information. Do not breast-feed an  infant while taking this medicine or for 4 months after the last dose. What side effects may I notice from receiving this medicine? Side effects that you should report to your doctor or health care professional as soon as possible:  allergic reactions like skin rash, itching or hives, swelling of the face, lips, or tongue  bloody or black, tarry  breathing problems  changes in vision  chest pain  chills  confusion  constipation  cough  diarrhea  dizziness or feeling faint or lightheaded  fast or irregular heartbeat  fever  flushing  joint pain  low blood counts - this medicine may decrease the number of white blood cells, red blood cells and platelets. You may be at increased risk for infections and bleeding.  muscle pain  muscle weakness  pain, tingling, numbness in the hands or feet  persistent headache  redness, blistering, peeling or loosening of the skin, including inside the mouth  signs and symptoms of high blood sugar such as dizziness; dry mouth; dry skin; fruity breath; nausea; stomach pain; increased hunger or thirst; increased urination  signs and symptoms of kidney injury like trouble passing urine or change in the amount of urine  signs and symptoms of liver injury like dark urine, light-colored stools, loss of appetite, nausea, right upper belly pain, yellowing of the eyes or skin  sweating  swollen lymph nodes  weight loss Side effects that usually do not require medical attention (report to your doctor or health care professional if they continue or are bothersome):  decreased appetite  hair loss  muscle pain  tiredness This list may not describe all possible side effects. Call your doctor for medical advice about side effects. You may report side effects to FDA at 1-800-FDA-1088. Where should I keep my medicine? This drug is given in a hospital or clinic and will not be stored at home. NOTE: This sheet is a summary. It may not  cover all possible information. If you have questions about this medicine, talk to your doctor, pharmacist, or health care provider.  2020 Elsevier/Gold Standard (2019-05-08 18:07:58)  Paclitaxel injection What is this medicine? PACLITAXEL (PAK li TAX el) is a chemotherapy drug. It targets fast dividing cells, like cancer cells, and causes these cells to die. This medicine is used to treat ovarian cancer, breast cancer, lung cancer, Kaposi's sarcoma, and other cancers. This medicine may be used for other purposes; ask your health care provider or pharmacist if you have questions. COMMON BRAND NAME(S): Onxol, Taxol What should I tell my health care provider before I take this medicine? They need to know if you have any of these conditions:  history of irregular heartbeat  liver disease  low blood counts, like low white cell, platelet, or red cell counts  lung or breathing disease, like asthma  tingling of the fingers or toes, or other nerve disorder  an unusual or allergic reaction to paclitaxel, alcohol, polyoxyethylated castor oil, other chemotherapy, other medicines, foods, dyes, or preservatives  pregnant or trying to get pregnant  breast-feeding How should I use this medicine? This drug is given as an infusion into a vein. It is administered in a hospital or clinic by a specially trained health care  professional. Talk to your pediatrician regarding the use of this medicine in children. Special care may be needed. Overdosage: If you think you have taken too much of this medicine contact a poison control center or emergency room at once. NOTE: This medicine is only for you. Do not share this medicine with others. What if I miss a dose? It is important not to miss your dose. Call your doctor or health care professional if you are unable to keep an appointment. What may interact with this medicine? Do not take this medicine with any of the following  medications:  disulfiram  metronidazole This medicine may also interact with the following medications:  antiviral medicines for hepatitis, HIV or AIDS  certain antibiotics like erythromycin and clarithromycin  certain medicines for fungal infections like ketoconazole and itraconazole  certain medicines for seizures like carbamazepine, phenobarbital, phenytoin  gemfibrozil  nefazodone  rifampin  St. John's wort This list may not describe all possible interactions. Give your health care provider a list of all the medicines, herbs, non-prescription drugs, or dietary supplements you use. Also tell them if you smoke, drink alcohol, or use illegal drugs. Some items may interact with your medicine. What should I watch for while using this medicine? Your condition will be monitored carefully while you are receiving this medicine. You will need important blood work done while you are taking this medicine. This medicine can cause serious allergic reactions. To reduce your risk you will need to take other medicine(s) before treatment with this medicine. If you experience allergic reactions like skin rash, itching or hives, swelling of the face, lips, or tongue, tell your doctor or health care professional right away. In some cases, you may be given additional medicines to help with side effects. Follow all directions for their use. This drug may make you feel generally unwell. This is not uncommon, as chemotherapy can affect healthy cells as well as cancer cells. Report any side effects. Continue your course of treatment even though you feel ill unless your doctor tells you to stop. Call your doctor or health care professional for advice if you get a fever, chills or sore throat, or other symptoms of a cold or flu. Do not treat yourself. This drug decreases your body's ability to fight infections. Try to avoid being around people who are sick. This medicine may increase your risk to bruise or  bleed. Call your doctor or health care professional if you notice any unusual bleeding. Be careful brushing and flossing your teeth or using a toothpick because you may get an infection or bleed more easily. If you have any dental work done, tell your dentist you are receiving this medicine. Avoid taking products that contain aspirin, acetaminophen, ibuprofen, naproxen, or ketoprofen unless instructed by your doctor. These medicines may hide a fever. Do not become pregnant while taking this medicine. Women should inform their doctor if they wish to become pregnant or think they might be pregnant. There is a potential for serious side effects to an unborn child. Talk to your health care professional or pharmacist for more information. Do not breast-feed an infant while taking this medicine. Men are advised not to father a child while receiving this medicine. This product may contain alcohol. Ask your pharmacist or healthcare provider if this medicine contains alcohol. Be sure to tell all healthcare providers you are taking this medicine. Certain medicines, like metronidazole and disulfiram, can cause an unpleasant reaction when taken with alcohol. The reaction includes flushing, headache, nausea,  vomiting, sweating, and increased thirst. The reaction can last from 30 minutes to several hours. What side effects may I notice from receiving this medicine? Side effects that you should report to your doctor or health care professional as soon as possible:  allergic reactions like skin rash, itching or hives, swelling of the face, lips, or tongue  breathing problems  changes in vision  fast, irregular heartbeat  high or low blood pressure  mouth sores  pain, tingling, numbness in the hands or feet  signs of decreased platelets or bleeding - bruising, pinpoint red spots on the skin, black, tarry stools, blood in the urine  signs of decreased red blood cells - unusually weak or tired, feeling faint  or lightheaded, falls  signs of infection - fever or chills, cough, sore throat, pain or difficulty passing urine  signs and symptoms of liver injury like dark yellow or brown urine; general ill feeling or flu-like symptoms; light-colored stools; loss of appetite; nausea; right upper belly pain; unusually weak or tired; yellowing of the eyes or skin  swelling of the ankles, feet, hands  unusually slow heartbeat Side effects that usually do not require medical attention (report to your doctor or health care professional if they continue or are bothersome):  diarrhea  hair loss  loss of appetite  muscle or joint pain  nausea, vomiting  pain, redness, or irritation at site where injected  tiredness This list may not describe all possible side effects. Call your doctor for medical advice about side effects. You may report side effects to FDA at 1-800-FDA-1088. Where should I keep my medicine? This drug is given in a hospital or clinic and will not be stored at home. NOTE: This sheet is a summary. It may not cover all possible information. If you have questions about this medicine, talk to your doctor, pharmacist, or health care provider.  2020 Elsevier/Gold Standard (2017-03-05 13:14:55)  Carboplatin injection What is this medicine? CARBOPLATIN (KAR boe pla tin) is a chemotherapy drug. It targets fast dividing cells, like cancer cells, and causes these cells to die. This medicine is used to treat ovarian cancer and many other cancers. This medicine may be used for other purposes; ask your health care provider or pharmacist if you have questions. COMMON BRAND NAME(S): Paraplatin What should I tell my health care provider before I take this medicine? They need to know if you have any of these conditions:  blood disorders  hearing problems  kidney disease  recent or ongoing radiation therapy  an unusual or allergic reaction to carboplatin, cisplatin, other chemotherapy, other  medicines, foods, dyes, or preservatives  pregnant or trying to get pregnant  breast-feeding How should I use this medicine? This drug is usually given as an infusion into a vein. It is administered in a hospital or clinic by a specially trained health care professional. Talk to your pediatrician regarding the use of this medicine in children. Special care may be needed. Overdosage: If you think you have taken too much of this medicine contact a poison control center or emergency room at once. NOTE: This medicine is only for you. Do not share this medicine with others. What if I miss a dose? It is important not to miss a dose. Call your doctor or health care professional if you are unable to keep an appointment. What may interact with this medicine?  medicines for seizures  medicines to increase blood counts like filgrastim, pegfilgrastim, sargramostim  some antibiotics like amikacin, gentamicin, neomycin,  streptomycin, tobramycin  vaccines Talk to your doctor or health care professional before taking any of these medicines:  acetaminophen  aspirin  ibuprofen  ketoprofen  naproxen This list may not describe all possible interactions. Give your health care provider a list of all the medicines, herbs, non-prescription drugs, or dietary supplements you use. Also tell them if you smoke, drink alcohol, or use illegal drugs. Some items may interact with your medicine. What should I watch for while using this medicine? Your condition will be monitored carefully while you are receiving this medicine. You will need important blood work done while you are taking this medicine. This drug may make you feel generally unwell. This is not uncommon, as chemotherapy can affect healthy cells as well as cancer cells. Report any side effects. Continue your course of treatment even though you feel ill unless your doctor tells you to stop. In some cases, you may be given additional medicines to help  with side effects. Follow all directions for their use. Call your doctor or health care professional for advice if you get a fever, chills or sore throat, or other symptoms of a cold or flu. Do not treat yourself. This drug decreases your body's ability to fight infections. Try to avoid being around people who are sick. This medicine may increase your risk to bruise or bleed. Call your doctor or health care professional if you notice any unusual bleeding. Be careful brushing and flossing your teeth or using a toothpick because you may get an infection or bleed more easily. If you have any dental work done, tell your dentist you are receiving this medicine. Avoid taking products that contain aspirin, acetaminophen, ibuprofen, naproxen, or ketoprofen unless instructed by your doctor. These medicines may hide a fever. Do not become pregnant while taking this medicine. Women should inform their doctor if they wish to become pregnant or think they might be pregnant. There is a potential for serious side effects to an unborn child. Talk to your health care professional or pharmacist for more information. Do not breast-feed an infant while taking this medicine. What side effects may I notice from receiving this medicine? Side effects that you should report to your doctor or health care professional as soon as possible:  allergic reactions like skin rash, itching or hives, swelling of the face, lips, or tongue  signs of infection - fever or chills, cough, sore throat, pain or difficulty passing urine  signs of decreased platelets or bleeding - bruising, pinpoint red spots on the skin, black, tarry stools, nosebleeds  signs of decreased red blood cells - unusually weak or tired, fainting spells, lightheadedness  breathing problems  changes in hearing  changes in vision  chest pain  high blood pressure  low blood counts - This drug may decrease the number of white blood cells, red blood cells and  platelets. You may be at increased risk for infections and bleeding.  nausea and vomiting  pain, swelling, redness or irritation at the injection site  pain, tingling, numbness in the hands or feet  problems with balance, talking, walking  trouble passing urine or change in the amount of urine Side effects that usually do not require medical attention (report to your doctor or health care professional if they continue or are bothersome):  hair loss  loss of appetite  metallic taste in the mouth or changes in taste This list may not describe all possible side effects. Call your doctor for medical advice about side effects. You  may report side effects to FDA at 1-800-FDA-1088. Where should I keep my medicine? This drug is given in a hospital or clinic and will not be stored at home. NOTE: This sheet is a summary. It may not cover all possible information. If you have questions about this medicine, talk to your doctor, pharmacist, or health care provider.  2020 Elsevier/Gold Standard (2007-10-07 14:38:05)

## 2020-01-06 NOTE — Patient Instructions (Signed)
Steps to Quit Smoking Smoking tobacco is the leading cause of preventable death. It can affect almost every organ in the body. Smoking puts you and people around you at risk for many serious, long-lasting (chronic) diseases. Quitting smoking can be hard, but it is one of the best things that you can do for your health. It is never too late to quit. How do I get ready to quit? When you decide to quit smoking, make a plan to help you succeed. Before you quit:  Pick a date to quit. Set a date within the next 2 weeks to give you time to prepare.  Write down the reasons why you are quitting. Keep this list in places where you will see it often.  Tell your family, friends, and co-workers that you are quitting. Their support is important.  Talk with your doctor about the choices that may help you quit.  Find out if your health insurance will pay for these treatments.  Know the people, places, things, and activities that make you want to smoke (triggers). Avoid them. What first steps can I take to quit smoking?  Throw away all cigarettes at home, at work, and in your car.  Throw away the things that you use when you smoke, such as ashtrays and lighters.  Clean your car. Make sure to empty the ashtray.  Clean your home, including curtains and carpets. What can I do to help me quit smoking? Talk with your doctor about taking medicines and seeing a counselor at the same time. You are more likely to succeed when you do both.  If you are pregnant or breastfeeding, talk with your doctor about counseling or other ways to quit smoking. Do not take medicine to help you quit smoking unless your doctor tells you to do so. To quit smoking: Quit right away  Quit smoking totally, instead of slowly cutting back on how much you smoke over a period of time.  Go to counseling. You are more likely to quit if you go to counseling sessions regularly. Take medicine You may take medicines to help you quit. Some  medicines need a prescription, and some you can buy over-the-counter. Some medicines may contain a drug called nicotine to replace the nicotine in cigarettes. Medicines may:  Help you to stop having the desire to smoke (cravings).  Help to stop the problems that come when you stop smoking (withdrawal symptoms). Your doctor may ask you to use:  Nicotine patches, gum, or lozenges.  Nicotine inhalers or sprays.  Non-nicotine medicine that is taken by mouth. Find resources Find resources and other ways to help you quit smoking and remain smoke-free after you quit. These resources are most helpful when you use them often. They include:  Online chats with a counselor.  Phone quitlines.  Printed self-help materials.  Support groups or group counseling.  Text messaging programs.  Mobile phone apps. Use apps on your mobile phone or tablet that can help you stick to your quit plan. There are many free apps for mobile phones and tablets as well as websites. Examples include Quit Guide from the CDC and smokefree.gov  What things can I do to make it easier to quit?   Talk to your family and friends. Ask them to support and encourage you.  Call a phone quitline (1-800-QUIT-NOW), reach out to support groups, or work with a counselor.  Ask people who smoke to not smoke around you.  Avoid places that make you want to smoke,   such as: ? Bars. ? Parties. ? Smoke-break areas at work.  Spend time with people who do not smoke.  Lower the stress in your life. Stress can make you want to smoke. Try these things to help your stress: ? Getting regular exercise. ? Doing deep-breathing exercises. ? Doing yoga. ? Meditating. ? Doing a body scan. To do this, close your eyes, focus on one area of your body at a time from head to toe. Notice which parts of your body are tense. Try to relax the muscles in those areas. How will I feel when I quit smoking? Day 1 to 3 weeks Within the first 24 hours,  you may start to have some problems that come from quitting tobacco. These problems are very bad 2-3 days after you quit, but they do not often last for more than 2-3 weeks. You may get these symptoms:  Mood swings.  Feeling restless, nervous, angry, or annoyed.  Trouble concentrating.  Dizziness.  Strong desire for high-sugar foods and nicotine.  Weight gain.  Trouble pooping (constipation).  Feeling like you may vomit (nausea).  Coughing or a sore throat.  Changes in how the medicines that you take for other issues work in your body.  Depression.  Trouble sleeping (insomnia). Week 3 and afterward After the first 2-3 weeks of quitting, you may start to notice more positive results, such as:  Better sense of smell and taste.  Less coughing and sore throat.  Slower heart rate.  Lower blood pressure.  Clearer skin.  Better breathing.  Fewer sick days. Quitting smoking can be hard. Do not give up if you fail the first time. Some people need to try a few times before they succeed. Do your best to stick to your quit plan, and talk with your doctor if you have any questions or concerns. Summary  Smoking tobacco is the leading cause of preventable death. Quitting smoking can be hard, but it is one of the best things that you can do for your health.  When you decide to quit smoking, make a plan to help you succeed.  Quit smoking right away, not slowly over a period of time.  When you start quitting, seek help from your doctor, family, or friends. This information is not intended to replace advice given to you by your health care provider. Make sure you discuss any questions you have with your health care provider. Document Revised: 03/27/2019 Document Reviewed: 09/20/2018 Elsevier Patient Education  2020 [employer]vier Inc.  

## 2020-01-06 NOTE — Progress Notes (Signed)
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 The patient presented today with concerning findings for disease recurrence and progression with enlarging and hypermetabolic right upper lobe Walker mass presenting as stage IIIa (T3, N0, M0).  PDL 1 expression 0%.  PRIOR THERAPY: 1) Curative stereotactic radiotherapy completed on June 21, 2017. 2) A course of concurrent chemoradiation with chemotherapy consisting of carboplatin for an AUC of 2 and paclitaxel 45 mg/m.  First dose started on 06/23/2018.  Status post 7 cycles.  Last dose of chemotherapy was given August 04, 2018. 3) Consolidation immunotherapy with Imfinzi 10 mg/KG every 2 weeks.  First dose September 09, 2018.  Status post 26 cycles.   CURRENT THERAPY: Systemic chemotherapy with carboplatin for AUC of 5, paclitaxel 175 NG/M2 and Keytruda 200 mg IV every 3 weeks.  First dose Nov 25, 2019.  Status post 2 cycles.  INTERVAL HISTORY: [name] 79 y.o. female returns to the clinic today for follow-up visit.  The patient is feeling fine today with no concerning complaints except for mild fatigue.  She continues to tolerate her treatment with chemotherapy fairly well.  She denied having any chest pain but has shortness of breath with exertion with mild cough and no hemoptysis.  She lost a few pounds since her last visit.  She has no headache or visual changes.  She is here today for evaluation before starting cycle #3 of her chemotherapy.  MEDICAL HISTORY: Past Medical History:  Diagnosis Date  . Cancer ([place])    Walker   . Carotid artery occlusion   . Clostridium difficile infection  06/2013  . Diabetes mellitus    takes Janumet daily  . Dyslipidemia    takes Crestor daily  . Gallstones   . GERD (gastroesophageal reflux disease)   . Heart murmur    hx of  . History of radiation therapy 06/11/17-06/21/17   right Walker 50 Gy in 5 fractions  . Hypertension    takes Prinzide and Verapamil daily  . Impaired speech    from stroke  . Neuropathy, diabetic ([place])   . Pneumonia   . PONV (postoperative nausea and vomiting)   . Seizures ([place]) 04/19/2018   recent hospitalization  . Smoker   . Stroke ([place]) 06/25/13  . Vertigo    but doesn't take any meds    ALLERGIES:  is allergic to codeine, benadryl [diphenhydramine], lipitor [atorvastatin], and phenergan [promethazine hcl].  MEDICATIONS:  Current Outpatient Medications  Medication Sig Dispense Refill  . aspirin EC 81 MG tablet Take 81 mg by mouth daily.    . Blood Glucose Monitoring Suppl (ONE TOUCH ULTRA 2) w/Device KIT 1 Device by Does not apply route 2 (two) times daily. 1 each 0  . cholecalciferol (VITAMIN D3) 25 MCG (1000 UNIT) tablet Take 1,000 Units by mouth daily.    . divalproex (DEPAKOTE ER) 500 MG 24 hr tablet Take one tablet  twice daily 60 tablet 6  . glucose blood (ONE TOUCH ULTRA TEST) test strip Use as instructed 100 each 12  . Lancets (ONETOUCH ULTRASOFT) lancets Use as instructed 100 each 12  . lidocaine-prilocaine (EMLA) cream APPLY TO THE AFFECTED AREA DAILY AS NEEDED (Patient taking differently: Apply 1  application topically daily as needed (Pain). ) 30 g 0  . lisinopril (ZESTRIL) 10 MG tablet TAKE 1 TABLET(10 MG) BY MOUTH DAILY 90 tablet 3  . Multiple Vitamin (MULTIVITAMIN) tablet Take 1 tablet by mouth daily.    . Multiple Vitamins-Minerals (MULTIVITAMIN PO) Take 1 tablet by mouth daily.    . pioglitazone (ACTOS) 30 MG tablet TAKE 1 TABLET(30 MG) BY MOUTH DAILY 90 tablet 1  . prochlorperazine (COMPAZINE) 10 MG tablet Take 1 tablet (10 mg total) by mouth every 6 (six) hours as needed for nausea  or vomiting. (Patient not taking: Reported on 08/25/2019) 30 tablet 0  . rosuvastatin (CRESTOR) 20 MG tablet TAKE 1 TABLET BY MOUTH ONCE DAILY 180 tablet 0  . SitaGLIPtin-MetFORMIN HCl (JANUMET XR) 50-1000 MG TB24 Take 1 tablet by mouth daily. 60 tablet 5  . vitamin B-12 (CYANOCOBALAMIN) 1000 MCG tablet Take 1,000 mcg by mouth daily.     No current facility-administered medications for this visit.   Facility-Administered Medications Ordered in Other Visits  Medication Dose Route Frequency Provider Last Rate Last Admin  . heparin lock flush 100 unit/mL  500 Units Intracatheter Once PRN Curt Bears, MD      . sodium chloride flush (NS) 0.9 % injection 10 mL  10 mL Intracatheter PRN Curt Bears, MD        SURGICAL HISTORY:  Past Surgical History:  Procedure Laterality Date  . cataract removed Right   . CHOLECYSTECTOMY    . COLONOSCOPY    . ENDARTERECTOMY Left 07/14/2013   Procedure: ENDARTERECTOMY CAROTID-LEFT;  Surgeon: Elam Dutch, MD;  Location: [facility];  Service: Vascular;  Laterality: Left;  . EYE SURGERY Left    cataract  . IR CV LINE INJECTION  05/13/2019  . IR IMAGING GUIDED PORT INSERTION  09/15/2018  . IR PATIENT EVAL TECH 0-60 MINS  05/08/2019  . IR PERC PLEURAL DRAIN W/INDWELL CATH W/IMG GUIDE  05/23/2017  . KNEE SURGERY Left 27yr ago  . LAPAROSCOPIC PARTIAL COLECTOMY N/A 09/02/2015   Procedure: LAPAROSCOPIC PARTIAL RIGHT COLECTOMY;  Surgeon: ALeighton Ruff MD;  Location: [facility];  Service: General;  Laterality: N/A;  . PATCH ANGIOPLASTY Left 07/14/2013   Procedure: LEFT CAROTID ARTERY PATCH ANGIOPLASTY;  Surgeon: CElam Dutch MD;  [facility]  Service: Vascular;  Laterality: Left;  .Marland KitchenVIDEO BRONCHOSCOPY N/A 06/04/2018   Procedure: VIDEO BRONCHOSCOPY;  Surgeon: HMelrose Nakayama MD;  [facility];  Service: Thoracic;  Laterality: N/A;  . wisdom      REVIEW OF SYSTEMS:  A comprehensive review of systems was negative except for: Constitutional:  positive for fatigue Respiratory: positive for dyspnea on exertion   PHYSICAL EXAMINATION: General appearance: alert, cooperative, fatigued and no distress Head: Normocephalic, without obvious abnormality, atraumatic Neck: no adenopathy, no JVD, supple, symmetrical, trachea midline and thyroid not enlarged, symmetric, no tenderness/mass/nodules Lymph nodes: Cervical, supraclavicular, and axillary nodes normal. Resp: clear to auscultation bilaterally Back: symmetric, no curvature. ROM normal. No CVA tenderness. Cardio: regular rate and rhythm, S1, S2 normal, no murmur, click, rub or gallop GI: soft, non-tender; bowel sounds normal; no masses,  no organomegaly Extremities: extremities normal, atraumatic, no cyanosis or edema  ECOG PERFORMANCE STATUS: 1 - Symptomatic but completely ambulatory  Blood pressure (!) 166/71, pulse 92, temperature (!) 97.5 F (36.4 C), temperature source Temporal, resp. rate 18, height _0  (1.676 m), weight 127 lb 1.6 oz (57.7 kg), SpO2 99 %.  LABORATORY DATA: Lab Results  Component Value Date  WBC 15.8 (H) 01/06/2020   HGB 10.2 (L) 01/06/2020   HCT 32.5 (L) 01/06/2020   MCV 88.3 01/06/2020   PLT 508 (H) 01/06/2020      Chemistry      Component Value Date/Time   NA 138 12/30/2019 1338   NA 139 05/07/2018 1147   K 4.2 12/30/2019 1338   CL 102 12/30/2019 1338   CO2 28 12/30/2019 1338   BUN 10 12/30/2019 1338   BUN 15 05/07/2018 1147   CREATININE 0.76 12/30/2019 1338   CREATININE 0.87 03/08/2015 0001      Component Value Date/Time   CALCIUM 9.0 12/30/2019 1338   ALKPHOS 130 (H) 12/30/2019 1338   AST 11 (L) 12/30/2019 1338   ALT <6 12/30/2019 1338   BILITOT 0.3 12/30/2019 1338       RADIOGRAPHIC STUDIES: No results found.  ASSESSMENT AND PLAN: This is a very pleasant 79 years old white female with recurrent non-small cell Walker cancer presented as a stage IIIa involving the right lower lobe. The patient underwent a course of concurrent  chemoradiation with weekly carboplatin and paclitaxel status post 7 cycles.  She tolerated this treatment well. The patient completed a course of consolidation immunotherapy with Imfinzi status post 26 cycles.  She tolerated her treatment well with no concerning complaints. The patient was on observation for several months. Imaging studies including repeat CT scan of the chest as well as a PET scan that showed evidence for disease recurrence in the right Walker and metastasis to the left Walker. The patient is started systemic chemotherapy with carboplatin for AUC of 5, paclitaxel 175 mg/M2 and Keytruda 200 mg IV every 3 weeks status post 2 cycles.  The patient continues to tolerate her treatment well with no concerning adverse effect except for the alopecia and mild fatigue. I recommended for her to proceed with cycle #3 today as planned. I will see her back for follow-up visit in 3 weeks for evaluation with repeat CT scan of the chest for restaging of her disease. For the hypertension, she was advised to take her blood pressure medications as prescribed and to monitor it closely at home. The patient was advised to call immediately if she has any concerning symptoms in the interval. The patient voices understanding of current disease status and treatment options and is in agreement with the current care plan. All questions were answered. The patient knows to call the clinic with any problems, questions or concerns. We can certainly see the patient much sooner if necessary.  Disclaimer: This note was dictated with voice recognition software. Similar sounding words can inadvertently be transcribed and may not be corrected upon review.

## 2020-01-08 ENCOUNTER — Inpatient Hospital Stay: Payer: Medicare Other

## 2020-01-08 ENCOUNTER — Other Ambulatory Visit: Payer: Self-pay

## 2020-01-08 ENCOUNTER — Other Ambulatory Visit (INDEPENDENT_AMBULATORY_CARE_PROVIDER_SITE_OTHER): Payer: Medicare Other

## 2020-01-08 VITALS — BP 152/66 | HR 84 | Resp 18

## 2020-01-08 DIAGNOSIS — C3491 Malignant neoplasm of unspecified part of right bronchus or lung: Secondary | ICD-10-CM

## 2020-01-08 DIAGNOSIS — Z5112 Encounter for antineoplastic immunotherapy: Secondary | ICD-10-CM | POA: Diagnosis not present

## 2020-01-08 DIAGNOSIS — E538 Deficiency of other specified B group vitamins: Secondary | ICD-10-CM | POA: Diagnosis not present

## 2020-01-08 DIAGNOSIS — Z5111 Encounter for antineoplastic chemotherapy: Secondary | ICD-10-CM | POA: Diagnosis not present

## 2020-01-08 DIAGNOSIS — C3411 Malignant neoplasm of upper lobe, right bronchus or lung: Secondary | ICD-10-CM | POA: Diagnosis not present

## 2020-01-08 DIAGNOSIS — E119 Type 2 diabetes mellitus without complications: Secondary | ICD-10-CM | POA: Diagnosis not present

## 2020-01-08 DIAGNOSIS — I10 Essential (primary) hypertension: Secondary | ICD-10-CM | POA: Diagnosis not present

## 2020-01-08 DIAGNOSIS — Z9221 Personal history of antineoplastic chemotherapy: Secondary | ICD-10-CM | POA: Diagnosis not present

## 2020-01-08 DIAGNOSIS — Z923 Personal history of irradiation: Secondary | ICD-10-CM | POA: Diagnosis not present

## 2020-01-08 DIAGNOSIS — Z5189 Encounter for other specified aftercare: Secondary | ICD-10-CM | POA: Diagnosis not present

## 2020-01-08 DIAGNOSIS — Z7982 Long term (current) use of aspirin: Secondary | ICD-10-CM | POA: Diagnosis not present

## 2020-01-08 DIAGNOSIS — Z79899 Other long term (current) drug therapy: Secondary | ICD-10-CM | POA: Diagnosis not present

## 2020-01-08 MED ORDER — PEGFILGRASTIM-CBQV 6 MG/0.6ML ~~LOC~~ SOSY
PREFILLED_SYRINGE | SUBCUTANEOUS | Status: AC
  Filled 2020-01-08: qty 0.6

## 2020-01-08 MED ORDER — PEGFILGRASTIM-CBQV 6 MG/0.6ML ~~LOC~~ SOSY
6.0000 mg | PREFILLED_SYRINGE | Freq: Once | SUBCUTANEOUS | Status: AC
  Administered 2020-01-08: 6 mg via SUBCUTANEOUS

## 2020-01-08 MED ORDER — CYANOCOBALAMIN 1000 MCG/ML IJ SOLN
1000.0000 ug | Freq: Once | INTRAMUSCULAR | Status: AC
  Administered 2020-01-08: 1000 ug via INTRAMUSCULAR

## 2020-01-08 NOTE — Patient Instructions (Signed)

## 2020-01-13 ENCOUNTER — Other Ambulatory Visit: Payer: Self-pay

## 2020-01-13 ENCOUNTER — Inpatient Hospital Stay: Payer: Medicare Other

## 2020-01-13 DIAGNOSIS — Z923 Personal history of irradiation: Secondary | ICD-10-CM | POA: Diagnosis not present

## 2020-01-13 DIAGNOSIS — Z9221 Personal history of antineoplastic chemotherapy: Secondary | ICD-10-CM | POA: Diagnosis not present

## 2020-01-13 DIAGNOSIS — E119 Type 2 diabetes mellitus without complications: Secondary | ICD-10-CM | POA: Diagnosis not present

## 2020-01-13 DIAGNOSIS — Z79899 Other long term (current) drug therapy: Secondary | ICD-10-CM | POA: Diagnosis not present

## 2020-01-13 DIAGNOSIS — C3411 Malignant neoplasm of upper lobe, right bronchus or lung: Secondary | ICD-10-CM | POA: Diagnosis not present

## 2020-01-13 DIAGNOSIS — Z5112 Encounter for antineoplastic immunotherapy: Secondary | ICD-10-CM | POA: Diagnosis not present

## 2020-01-13 DIAGNOSIS — I10 Essential (primary) hypertension: Secondary | ICD-10-CM | POA: Diagnosis not present

## 2020-01-13 DIAGNOSIS — Z5111 Encounter for antineoplastic chemotherapy: Secondary | ICD-10-CM | POA: Diagnosis not present

## 2020-01-13 DIAGNOSIS — C3491 Malignant neoplasm of unspecified part of right bronchus or lung: Secondary | ICD-10-CM

## 2020-01-13 DIAGNOSIS — Z7982 Long term (current) use of aspirin: Secondary | ICD-10-CM | POA: Diagnosis not present

## 2020-01-13 DIAGNOSIS — Z5189 Encounter for other specified aftercare: Secondary | ICD-10-CM | POA: Diagnosis not present

## 2020-01-13 LAB — CMP (CANCER CENTER ONLY)
ALT: 7 U/L (ref 0–44)
AST: 11 U/L — ABNORMAL LOW (ref 15–41)
Albumin: 3.2 g/dL — ABNORMAL LOW (ref 3.5–5.0)
Alkaline Phosphatase: 155 U/L — ABNORMAL HIGH (ref 38–126)
Anion gap: 9 (ref 5–15)
BUN: 13 mg/dL (ref 8–23)
CO2: 27 mmol/L (ref 22–32)
Calcium: 9.7 mg/dL (ref 8.9–10.3)
Chloride: 101 mmol/L (ref 98–111)
Creatinine: 0.75 mg/dL (ref 0.44–1.00)
GFR, Est AFR Am: >60 mL/min (ref >60)
GFR, Estimated: >60 mL/min (ref >60)
Glucose, Bld: 143 mg/dL — ABNORMAL HIGH (ref 70–99)
Potassium: 3.9 mmol/L (ref 3.5–5.1)
Sodium: 137 mmol/L (ref 135–145)
Total Bilirubin: 0.2 mg/dL — ABNORMAL LOW (ref 0.3–1.2)
Total Protein: 6.9 g/dL (ref 6.5–8.1)

## 2020-01-13 LAB — CBC WITH DIFFERENTIAL (CANCER CENTER ONLY)
Abs Immature Granulocytes: 0.27 10*3/uL — ABNORMAL HIGH (ref 0.00–0.07)
Basophils Absolute: 0.1 10*3/uL (ref 0.0–0.1)
Basophils Relative: 1 %
Eosinophils Absolute: 0 10*3/uL (ref 0.0–0.5)
Eosinophils Relative: 0 %
HCT: 28.8 % — ABNORMAL LOW (ref 36.0–46.0)
Hemoglobin: 9.2 g/dL — ABNORMAL LOW (ref 12.0–15.0)
Immature Granulocytes: 1 %
Lymphocytes Relative: 5 %
Lymphs Abs: 1.4 10*3/uL (ref 0.7–4.0)
MCH: 28 pg (ref 26.0–34.0)
MCHC: 31.9 g/dL (ref 30.0–36.0)
MCV: 87.8 fL (ref 80.0–100.0)
Monocytes Absolute: 1.2 10*3/uL — ABNORMAL HIGH (ref 0.1–1.0)
Monocytes Relative: 4 %
Neutro Abs: 24.3 10*3/uL — ABNORMAL HIGH (ref 1.7–7.7)
Neutrophils Relative %: 89 %
Platelet Count: 426 10*3/uL — ABNORMAL HIGH (ref 150–400)
RBC: 3.28 MIL/uL — ABNORMAL LOW (ref 3.87–5.11)
RDW: 18.2 % — ABNORMAL HIGH (ref 11.5–15.5)
WBC Count: 27.3 10*3/uL — ABNORMAL HIGH (ref 4.0–10.5)
nRBC: 0 % (ref 0.0–0.2)

## 2020-01-13 MED ORDER — SODIUM CHLORIDE 0.9% FLUSH
10.0000 mL | INTRAVENOUS | Status: DC | PRN
  Filled 2020-01-13: qty 10

## 2020-01-13 MED ORDER — HEPARIN SOD (PORK) LOCK FLUSH 100 UNIT/ML IV SOLN
500.0000 [IU] | Freq: Once | INTRAVENOUS | Status: DC | PRN
  Filled 2020-01-13: qty 5

## 2020-01-13 NOTE — Progress Notes (Signed)
Pt had blood drawn peripherally

## 2020-01-20 ENCOUNTER — Inpatient Hospital Stay: Payer: Medicare Other | Attending: Internal Medicine

## 2020-01-20 ENCOUNTER — Other Ambulatory Visit: Payer: Self-pay

## 2020-01-20 ENCOUNTER — Inpatient Hospital Stay: Payer: Medicare Other

## 2020-01-20 DIAGNOSIS — Z87891 Personal history of nicotine dependence: Secondary | ICD-10-CM | POA: Insufficient documentation

## 2020-01-20 DIAGNOSIS — Z5189 Encounter for other specified aftercare: Secondary | ICD-10-CM | POA: Diagnosis not present

## 2020-01-20 DIAGNOSIS — Z79899 Other long term (current) drug therapy: Secondary | ICD-10-CM | POA: Insufficient documentation

## 2020-01-20 DIAGNOSIS — Z8673 Personal history of transient ischemic attack (TIA), and cerebral infarction without residual deficits: Secondary | ICD-10-CM | POA: Diagnosis not present

## 2020-01-20 DIAGNOSIS — C3491 Malignant neoplasm of unspecified part of right bronchus or lung: Secondary | ICD-10-CM

## 2020-01-20 DIAGNOSIS — E119 Type 2 diabetes mellitus without complications: Secondary | ICD-10-CM | POA: Insufficient documentation

## 2020-01-20 DIAGNOSIS — Z7982 Long term (current) use of aspirin: Secondary | ICD-10-CM | POA: Insufficient documentation

## 2020-01-20 DIAGNOSIS — Z7984 Long term (current) use of oral hypoglycemic drugs: Secondary | ICD-10-CM | POA: Insufficient documentation

## 2020-01-20 DIAGNOSIS — C3411 Malignant neoplasm of upper lobe, right bronchus or lung: Secondary | ICD-10-CM | POA: Insufficient documentation

## 2020-01-20 DIAGNOSIS — E785 Hyperlipidemia, unspecified: Secondary | ICD-10-CM | POA: Diagnosis not present

## 2020-01-20 DIAGNOSIS — I10 Essential (primary) hypertension: Secondary | ICD-10-CM | POA: Insufficient documentation

## 2020-01-20 DIAGNOSIS — Z5112 Encounter for antineoplastic immunotherapy: Secondary | ICD-10-CM | POA: Diagnosis not present

## 2020-01-20 DIAGNOSIS — Z5111 Encounter for antineoplastic chemotherapy: Secondary | ICD-10-CM | POA: Insufficient documentation

## 2020-01-20 LAB — CBC WITH DIFFERENTIAL (CANCER CENTER ONLY)
Abs Immature Granulocytes: 0.2 10*3/uL — ABNORMAL HIGH (ref 0.00–0.07)
Basophils Absolute: 0.1 10*3/uL (ref 0.0–0.1)
Basophils Relative: 0 %
Eosinophils Absolute: 0.2 10*3/uL (ref 0.0–0.5)
Eosinophils Relative: 1 %
HCT: 29.1 % — ABNORMAL LOW (ref 36.0–46.0)
Hemoglobin: 9.2 g/dL — ABNORMAL LOW (ref 12.0–15.0)
Immature Granulocytes: 1 %
Lymphocytes Relative: 7 %
Lymphs Abs: 1.6 10*3/uL (ref 0.7–4.0)
MCH: 28 pg (ref 26.0–34.0)
MCHC: 31.6 g/dL (ref 30.0–36.0)
MCV: 88.7 fL (ref 80.0–100.0)
Monocytes Absolute: 1 10*3/uL (ref 0.1–1.0)
Monocytes Relative: 5 %
Neutro Abs: 18 10*3/uL — ABNORMAL HIGH (ref 1.7–7.7)
Neutrophils Relative %: 86 %
Platelet Count: 431 10*3/uL — ABNORMAL HIGH (ref 150–400)
RBC: 3.28 MIL/uL — ABNORMAL LOW (ref 3.87–5.11)
RDW: 18.7 % — ABNORMAL HIGH (ref 11.5–15.5)
WBC Count: 21 10*3/uL — ABNORMAL HIGH (ref 4.0–10.5)
nRBC: 0 % (ref 0.0–0.2)

## 2020-01-20 LAB — CMP (CANCER CENTER ONLY)
ALT: 9 U/L (ref 0–44)
AST: 11 U/L — ABNORMAL LOW (ref 15–41)
Albumin: 2.9 g/dL — ABNORMAL LOW (ref 3.5–5.0)
Alkaline Phosphatase: 122 U/L (ref 38–126)
Anion gap: 9 (ref 5–15)
BUN: 12 mg/dL (ref 8–23)
CO2: 30 mmol/L (ref 22–32)
Calcium: 10.1 mg/dL (ref 8.9–10.3)
Chloride: 99 mmol/L (ref 98–111)
Creatinine: 0.83 mg/dL (ref 0.44–1.00)
GFR, Est AFR Am: >60 mL/min (ref >60)
GFR, Estimated: >60 mL/min (ref >60)
Glucose, Bld: 123 mg/dL — ABNORMAL HIGH (ref 70–99)
Potassium: 4.6 mmol/L (ref 3.5–5.1)
Sodium: 138 mmol/L (ref 135–145)
Total Bilirubin: 0.2 mg/dL — ABNORMAL LOW (ref 0.3–1.2)
Total Protein: 6.9 g/dL (ref 6.5–8.1)

## 2020-01-26 ENCOUNTER — Other Ambulatory Visit: Payer: Self-pay

## 2020-01-26 ENCOUNTER — Telehealth: Payer: Self-pay | Admitting: Medical Oncology

## 2020-01-26 ENCOUNTER — Inpatient Hospital Stay: Payer: Medicare Other

## 2020-01-26 ENCOUNTER — Encounter: Payer: Self-pay | Admitting: Internal Medicine

## 2020-01-26 ENCOUNTER — Inpatient Hospital Stay (HOSPITAL_BASED_OUTPATIENT_CLINIC_OR_DEPARTMENT_OTHER): Payer: Medicare Other | Admitting: Internal Medicine

## 2020-01-26 ENCOUNTER — Ambulatory Visit (HOSPITAL_COMMUNITY)
Admission: RE | Admit: 2020-01-26 | Discharge: 2020-01-26 | Disposition: A | Discharge status: Home / Self Care | Payer: Medicare Other | Source: Ambulatory Visit | Attending: Internal Medicine | Admitting: Internal Medicine

## 2020-01-26 ENCOUNTER — Encounter (HOSPITAL_COMMUNITY): Payer: Self-pay

## 2020-01-26 VITALS — BP 137/61 | HR 99 | Temp 97.9°F | Resp 18 | Ht 66.0 in | Wt 120.3 lb

## 2020-01-26 DIAGNOSIS — E119 Type 2 diabetes mellitus without complications: Secondary | ICD-10-CM | POA: Diagnosis not present

## 2020-01-26 DIAGNOSIS — E785 Hyperlipidemia, unspecified: Secondary | ICD-10-CM | POA: Diagnosis not present

## 2020-01-26 DIAGNOSIS — C3491 Malignant neoplasm of unspecified part of right bronchus or lung: Secondary | ICD-10-CM

## 2020-01-26 DIAGNOSIS — Z5111 Encounter for antineoplastic chemotherapy: Secondary | ICD-10-CM

## 2020-01-26 DIAGNOSIS — I2510 Atherosclerotic heart disease of native coronary artery without angina pectoris: Secondary | ICD-10-CM | POA: Diagnosis not present

## 2020-01-26 DIAGNOSIS — I10 Essential (primary) hypertension: Secondary | ICD-10-CM | POA: Diagnosis not present

## 2020-01-26 DIAGNOSIS — I700 Atherosclerosis of aorta: Secondary | ICD-10-CM | POA: Diagnosis not present

## 2020-01-26 DIAGNOSIS — Z79899 Other long term (current) drug therapy: Secondary | ICD-10-CM | POA: Diagnosis not present

## 2020-01-26 DIAGNOSIS — Z7984 Long term (current) use of oral hypoglycemic drugs: Secondary | ICD-10-CM | POA: Diagnosis not present

## 2020-01-26 DIAGNOSIS — Z7982 Long term (current) use of aspirin: Secondary | ICD-10-CM | POA: Diagnosis not present

## 2020-01-26 DIAGNOSIS — Z87891 Personal history of nicotine dependence: Secondary | ICD-10-CM | POA: Diagnosis not present

## 2020-01-26 DIAGNOSIS — Z5112 Encounter for antineoplastic immunotherapy: Secondary | ICD-10-CM | POA: Diagnosis not present

## 2020-01-26 DIAGNOSIS — Z95828 Presence of other vascular implants and grafts: Secondary | ICD-10-CM

## 2020-01-26 DIAGNOSIS — J90 Pleural effusion, not elsewhere classified: Secondary | ICD-10-CM | POA: Diagnosis not present

## 2020-01-26 DIAGNOSIS — C3490 Malignant neoplasm of unspecified part of unspecified bronchus or lung: Secondary | ICD-10-CM | POA: Diagnosis not present

## 2020-01-26 DIAGNOSIS — Z8673 Personal history of transient ischemic attack (TIA), and cerebral infarction without residual deficits: Secondary | ICD-10-CM | POA: Diagnosis not present

## 2020-01-26 DIAGNOSIS — Z5189 Encounter for other specified aftercare: Secondary | ICD-10-CM | POA: Diagnosis not present

## 2020-01-26 DIAGNOSIS — C3411 Malignant neoplasm of upper lobe, right bronchus or lung: Secondary | ICD-10-CM | POA: Diagnosis not present

## 2020-01-26 LAB — CBC WITH DIFFERENTIAL (CANCER CENTER ONLY)
Abs Immature Granulocytes: 0.07 10*3/uL (ref 0.00–0.07)
Basophils Absolute: 0 10*3/uL (ref 0.0–0.1)
Basophils Relative: 0 %
Eosinophils Absolute: 0.1 10*3/uL (ref 0.0–0.5)
Eosinophils Relative: 0 %
HCT: 31.1 % — ABNORMAL LOW (ref 36.0–46.0)
Hemoglobin: 9.7 g/dL — ABNORMAL LOW (ref 12.0–15.0)
Immature Granulocytes: 0 %
Lymphocytes Relative: 6 %
Lymphs Abs: 0.9 10*3/uL (ref 0.7–4.0)
MCH: 27.9 pg (ref 26.0–34.0)
MCHC: 31.2 g/dL (ref 30.0–36.0)
MCV: 89.4 fL (ref 80.0–100.0)
Monocytes Absolute: 0.9 10*3/uL (ref 0.1–1.0)
Monocytes Relative: 6 %
Neutro Abs: 13.8 10*3/uL — ABNORMAL HIGH (ref 1.7–7.7)
Neutrophils Relative %: 88 %
Platelet Count: 513 10*3/uL — ABNORMAL HIGH (ref 150–400)
RBC: 3.48 MIL/uL — ABNORMAL LOW (ref 3.87–5.11)
RDW: 18.8 % — ABNORMAL HIGH (ref 11.5–15.5)
WBC Count: 15.7 10*3/uL — ABNORMAL HIGH (ref 4.0–10.5)
nRBC: 0 % (ref 0.0–0.2)

## 2020-01-26 LAB — CMP (CANCER CENTER ONLY)
ALT: 7 U/L (ref 0–44)
AST: 8 U/L — ABNORMAL LOW (ref 15–41)
Albumin: 2.8 g/dL — ABNORMAL LOW (ref 3.5–5.0)
Alkaline Phosphatase: 98 U/L (ref 38–126)
Anion gap: 10 (ref 5–15)
BUN: 10 mg/dL (ref 8–23)
CO2: 25 mmol/L (ref 22–32)
Calcium: 10 mg/dL (ref 8.9–10.3)
Chloride: 101 mmol/L (ref 98–111)
Creatinine: 0.77 mg/dL (ref 0.44–1.00)
GFR, Est AFR Am: >60 mL/min (ref >60)
GFR, Estimated: >60 mL/min (ref >60)
Glucose, Bld: 130 mg/dL — ABNORMAL HIGH (ref 70–99)
Potassium: 4.1 mmol/L (ref 3.5–5.1)
Sodium: 136 mmol/L (ref 135–145)
Total Bilirubin: 0.3 mg/dL (ref 0.3–1.2)
Total Protein: 6.7 g/dL (ref 6.5–8.1)

## 2020-01-26 LAB — TSH: TSH: 1.24 u[IU]/mL (ref 0.308–3.960)

## 2020-01-26 MED ORDER — PALONOSETRON HCL INJECTION 0.25 MG/5ML
0.2500 mg | Freq: Once | INTRAVENOUS | Status: AC
  Administered 2020-01-26: 0.25 mg via INTRAVENOUS

## 2020-01-26 MED ORDER — ALTEPLASE 2 MG IJ SOLR
INTRAMUSCULAR | Status: AC
  Filled 2020-01-26: qty 2

## 2020-01-26 MED ORDER — SODIUM CHLORIDE 0.9 % IV SOLN
346.0000 mg | Freq: Once | INTRAVENOUS | Status: AC
  Administered 2020-01-26: 350 mg via INTRAVENOUS
  Filled 2020-01-26: qty 35

## 2020-01-26 MED ORDER — SODIUM CHLORIDE 0.9 % IV SOLN
150.0000 mg | Freq: Once | INTRAVENOUS | Status: AC
  Administered 2020-01-26: 150 mg via INTRAVENOUS
  Filled 2020-01-26: qty 150

## 2020-01-26 MED ORDER — PALONOSETRON HCL INJECTION 0.25 MG/5ML
INTRAVENOUS | Status: AC
  Filled 2020-01-26: qty 5

## 2020-01-26 MED ORDER — SODIUM CHLORIDE 0.9 % IV SOLN
Freq: Once | INTRAVENOUS | Status: AC
  Filled 2020-01-26: qty 250

## 2020-01-26 MED ORDER — IOHEXOL 300 MG/ML  SOLN
75.0000 mL | Freq: Once | INTRAMUSCULAR | Status: AC | PRN
  Administered 2020-01-26: 75 mL via INTRAVENOUS

## 2020-01-26 MED ORDER — SODIUM CHLORIDE (PF) 0.9 % IJ SOLN
INTRAMUSCULAR | Status: AC
  Filled 2020-01-26: qty 50

## 2020-01-26 MED ORDER — SODIUM CHLORIDE 0.9 % IV SOLN
10.0000 mg | Freq: Once | INTRAVENOUS | Status: AC
  Administered 2020-01-26: 10 mg via INTRAVENOUS
  Filled 2020-01-26: qty 10

## 2020-01-26 MED ORDER — SODIUM CHLORIDE 0.9% FLUSH
10.0000 mL | INTRAVENOUS | Status: DC | PRN
  Administered 2020-01-26: 10 mL
  Filled 2020-01-26: qty 10

## 2020-01-26 MED ORDER — DIPHENHYDRAMINE HCL 50 MG/ML IJ SOLN
25.0000 mg | Freq: Once | INTRAMUSCULAR | Status: AC
  Administered 2020-01-26: 25 mg via INTRAVENOUS

## 2020-01-26 MED ORDER — HEPARIN SOD (PORK) LOCK FLUSH 100 UNIT/ML IV SOLN
500.0000 [IU] | Freq: Once | INTRAVENOUS | Status: AC | PRN
  Administered 2020-01-26: 500 [IU]
  Filled 2020-01-26: qty 5

## 2020-01-26 MED ORDER — SODIUM CHLORIDE 0.9 % IV SOLN
200.0000 mg | Freq: Once | INTRAVENOUS | Status: AC
  Administered 2020-01-26: 200 mg via INTRAVENOUS
  Filled 2020-01-26: qty 8

## 2020-01-26 MED ORDER — FAMOTIDINE IN NACL 20-0.9 MG/50ML-% IV SOLN
INTRAVENOUS | Status: AC
  Filled 2020-01-26: qty 50

## 2020-01-26 MED ORDER — ALTEPLASE 2 MG IJ SOLR
2.0000 mg | Freq: Once | INTRAMUSCULAR | Status: AC | PRN
  Administered 2020-01-26: 2 mg
  Filled 2020-01-26: qty 2

## 2020-01-26 MED ORDER — DIPHENHYDRAMINE HCL 50 MG/ML IJ SOLN
INTRAMUSCULAR | Status: AC
  Filled 2020-01-26: qty 1

## 2020-01-26 MED ORDER — FAMOTIDINE IN NACL 20-0.9 MG/50ML-% IV SOLN
20.0000 mg | Freq: Once | INTRAVENOUS | Status: AC
  Administered 2020-01-26: 20 mg via INTRAVENOUS

## 2020-01-26 MED ORDER — SODIUM CHLORIDE 0.9 % IV SOLN
175.0000 mg/m2 | Freq: Once | INTRAVENOUS | Status: AC
  Administered 2020-01-26: 294 mg via INTRAVENOUS
  Filled 2020-01-26: qty 49

## 2020-01-26 NOTE — Progress Notes (Signed)
During treatment patient became confused. Communicated this change to the MD desk nurse Diane RN. She contacted the daughter and the daughter stated that the patient does not tolerate IV Benadryl and that she will become very confused.

## 2020-01-26 NOTE — Progress Notes (Signed)
Dr Julien Nordmann said pt will not be getting anymore iv or po benadryl. Dtr aware of pts behavior after Benadryl and has dealt with pt before.

## 2020-01-26 NOTE — Progress Notes (Signed)
[facility] Telephone:([phone]) [phone]   Fax:(336) [phone]  OFFICE PROGRESS NOTE  [name], MD [place] [address]  DIAGNOSIS:Recurrent non-small cell lung cancer likely squamous cell carcinoma that was initially diagnosed as a stage IIA(T2b, N0, M0) inAugust 2018status post curative stereotactic radiotherapy completed June 21, 2017. The patient presented today with concerning findings for disease recurrence and progression with enlarging and hypermetabolic right upper lobe lung mass presenting as stage IIIa (T3, N0, M0).  PDL 1 expression 0%.  PRIOR THERAPY: 1) Curative stereotactic radiotherapy completed on June 21, 2017. 2) A course of concurrent chemoradiation with chemotherapy consisting of carboplatin for an AUC of 2 and paclitaxel 45 mg/m.  First dose started on 06/23/2018.  Status post 7 cycles.  Last dose of chemotherapy was given August 04, 2018. 3) Consolidation immunotherapy with Imfinzi 10 mg/KG every 2 weeks.  First dose September 09, 2018.  Status post 26 cycles.   CURRENT THERAPY: Systemic chemotherapy with carboplatin for AUC of 5, paclitaxel 175 NG/M2 and Keytruda 200 mg IV every 3 weeks.  First dose Nov 25, 2019.  Status post 3 cycles.  INTERVAL HISTORY: [name] 79 y.o. female returns to the clinic today for follow-up visit.  The patient is feeling fine today with no concerning complaints except for few episodes of diarrhea started 2 days after the chemotherapy and has been up to 4 times a day for more than a week.  She does not take anything for her diarrhea.  She lost several pounds since her last visit.  The patient denied having any current chest pain but has shortness of breath with exertion with no cough or hemoptysis.  She denied having any weight loss or night sweats.  She has no nausea, vomiting or constipation.  She denied having any headache or visual changes.  She tolerated the last cycle of  her treatment fairly well.  The patient had repeat CT scan of the chest performed recently and she is here for evaluation and discussion of her risk her results.   MEDICAL HISTORY: Past Medical History:  Diagnosis Date   Carotid artery occlusion    Clostridium difficile infection 06/2013   Diabetes mellitus    takes Janumet daily   Dyslipidemia    takes Crestor daily   Gallstones    GERD (gastroesophageal reflux disease)    Heart murmur    hx of   History of radiation therapy 06/11/17-06/21/17   right lung 50 Gy in 5 fractions   Hypertension    takes Prinzide and Verapamil daily   Impaired speech    from stroke   Neuropathy, diabetic ([place])    nscl ca dx'd 2018   Lung    Pneumonia    PONV (postoperative nausea and vomiting)    Seizures ([place]) 04/19/2018   recent hospitalization   Smoker    Stroke ([place]) 06/25/13   Vertigo    but doesn't take any meds    ALLERGIES:  is allergic to codeine, benadryl [diphenhydramine], lipitor [atorvastatin], and phenergan [promethazine hcl].  MEDICATIONS:  Current Outpatient Medications  Medication Sig Dispense Refill   aspirin EC 81 MG tablet Take 81 mg by mouth daily.     Blood Glucose Monitoring Suppl (ONE TOUCH ULTRA 2) w/Device KIT 1 Device by Does not apply route 2 (two) times daily. 1 each 0   cholecalciferol (VITAMIN D3) 25 MCG (1000 UNIT) tablet Take 1,000 Units by mouth daily.  divalproex (DEPAKOTE ER) 500 MG 24 hr tablet Take one tablet  twice daily 60 tablet 6   glucose blood (ONE TOUCH ULTRA TEST) test strip Use as instructed 100 each 12   Lancets (ONETOUCH ULTRASOFT) lancets Use as instructed 100 each 12   lidocaine-prilocaine (EMLA) cream APPLY TO THE AFFECTED AREA DAILY AS NEEDED (Patient taking differently: Apply 1 application topically daily as needed (Pain). ) 30 g 0   lisinopril (ZESTRIL) 10 MG tablet TAKE 1 TABLET(10 MG) BY MOUTH DAILY 90 tablet 3   Multiple Vitamin (MULTIVITAMIN) tablet  Take 1 tablet by mouth daily.     Multiple Vitamins-Minerals (MULTIVITAMIN PO) Take 1 tablet by mouth daily.     pioglitazone (ACTOS) 30 MG tablet TAKE 1 TABLET(30 MG) BY MOUTH DAILY 90 tablet 1   prochlorperazine (COMPAZINE) 10 MG tablet Take 1 tablet (10 mg total) by mouth every 6 (six) hours as needed for nausea or vomiting. (Patient not taking: Reported on 08/25/2019) 30 tablet 0   rosuvastatin (CRESTOR) 20 MG tablet TAKE 1 TABLET BY MOUTH ONCE DAILY 180 tablet 0   SitaGLIPtin-MetFORMIN HCl (JANUMET XR) 50-1000 MG TB24 Take 1 tablet by mouth daily. 60 tablet 5   vitamin B-12 (CYANOCOBALAMIN) 1000 MCG tablet Take 1,000 mcg by mouth daily.     No current facility-administered medications for this visit.   Facility-Administered Medications Ordered in Other Visits  Medication Dose Route Frequency Provider Last Rate Last Admin   heparin lock flush 100 unit/mL  500 Units Intracatheter Once PRN Curt Bears, MD       sodium chloride (PF) 0.9 % injection            sodium chloride flush (NS) 0.9 % injection 10 mL  10 mL Intracatheter PRN Curt Bears, MD        SURGICAL HISTORY:  Past Surgical History:  Procedure Laterality Date   cataract removed Right    CHOLECYSTECTOMY     COLONOSCOPY     ENDARTERECTOMY Left 07/14/2013   Procedure: ENDARTERECTOMY CAROTID-LEFT;  Surgeon: Elam Dutch, MD;  Location: [facility];  Service: Vascular;  Laterality: Left;   EYE SURGERY Left    cataract   IR CV LINE INJECTION  05/13/2019   IR IMAGING GUIDED PORT INSERTION  09/15/2018   IR PATIENT EVAL TECH 0-60 MINS  05/08/2019   IR PERC PLEURAL DRAIN W/INDWELL CATH W/IMG GUIDE  05/23/2017   KNEE SURGERY Left 52yr ago   LAPAROSCOPIC PARTIAL COLECTOMY N/A 09/02/2015   Procedure: LAPAROSCOPIC PARTIAL RIGHT COLECTOMY;  Surgeon: ALeighton Ruff MD;  [facility];  Service: General;  Laterality: N/A;   PATCH ANGIOPLASTY Left 07/14/2013   Procedure: LEFT CAROTID ARTERY PATCH ANGIOPLASTY;   Surgeon: CElam Dutch MD;  [facility]  Service: Vascular;  Laterality: Left;   VIDEO BRONCHOSCOPY N/A 06/04/2018   Procedure: VIDEO BRONCHOSCOPY;  Surgeon: HMelrose Nakayama MD;  [facility]  Service: Thoracic;  Laterality: N/A;   wisdom      REVIEW OF SYSTEMS:  Constitutional: positive for fatigue Eyes: negative Ears, nose, mouth, throat, and face: negative Respiratory: positive for dyspnea on exertion Cardiovascular: negative Gastrointestinal: positive for diarrhea Genitourinary:negative Integument/breast: negative Hematologic/lymphatic: negative Musculoskeletal:negative Neurological: negative Behavioral/Psych: negative Endocrine: negative Allergic/Immunologic: negative   PHYSICAL EXAMINATION: General appearance: alert, cooperative, fatigued and no distress Head: Normocephalic, without obvious abnormality, atraumatic Neck: no adenopathy, no JVD, supple, symmetrical, trachea midline and thyroid not enlarged, symmetric, no tenderness/mass/nodules Lymph nodes: Cervical, supraclavicular, and axillary nodes normal. Resp: clear  to auscultation bilaterally Back: symmetric, no curvature. ROM normal. No CVA tenderness. Cardio: regular rate and rhythm, S1, S2 normal, no murmur, click, rub or gallop GI: soft, non-tender; bowel sounds normal; no masses,  no organomegaly Extremities: extremities normal, atraumatic, no cyanosis or edema Neurologic: Alert and oriented X 3, normal strength and tone. Normal symmetric reflexes. Normal coordination and gait  ECOG PERFORMANCE STATUS: 1 - Symptomatic but completely ambulatory  Blood pressure 137/61, pulse 99, temperature 97.9 F (36.6 C), temperature source Temporal, resp. rate 18, height '5\' 6"'  (1.676 m), weight 120 lb 4.8 oz (54.6 kg), SpO2 95 %.  LABORATORY DATA: Lab Results  Component Value Date   WBC 15.7 (H) 01/26/2020   HGB 9.7 (L) 01/26/2020   HCT 31.1 (L) 01/26/2020   MCV 89.4 01/26/2020   PLT 513 (H)  01/26/2020      Chemistry      Component Value Date/Time   NA 138 01/20/2020 1336   NA 139 05/07/2018 1147   K 4.6 01/20/2020 1336   CL 99 01/20/2020 1336   CO2 30 01/20/2020 1336   BUN 12 01/20/2020 1336   BUN 15 05/07/2018 1147   CREATININE 0.83 01/20/2020 1336   CREATININE 0.87 03/08/2015 0001      Component Value Date/Time   CALCIUM 10.1 01/20/2020 1336   ALKPHOS 122 01/20/2020 1336   AST 11 (L) 01/20/2020 1336   ALT 9 01/20/2020 1336   BILITOT 0.2 (L) 01/20/2020 1336       RADIOGRAPHIC STUDIES: CT Chest W Contrast  Result Date: 01/26/2020 CLINICAL DATA:  Restaging non-small cell lung cancer. Loss of weight. EXAM: CT CHEST WITH CONTRAST TECHNIQUE: Multidetector CT imaging of the chest was performed during intravenous contrast administration. CONTRAST:  30m OMNIPAQUE IOHEXOL 300 MG/ML  SOLN COMPARISON:  PET-CT 11/17/2019 FINDINGS: Cardiovascular: Normal heart size. No pericardial effusion. Aortic atherosclerosis. Three vessel coronary artery atherosclerotic calcifications. Mediastinum/Nodes: Normal appearance of the thyroid gland. The trachea appears patent and is midline. Normal appearance of the esophagus. Previously FDG avid right paratracheal lymph node measures 0.7 cm, image 54/2. This is compared with 1.1 cm previously. Right suprahilar lymph node measures 1.5 cm, image 62/2.  Unchanged. Adjacent right suprahilar lymph node measures 1 cm, image 62/2. Unchanged. Lungs/Pleura: Right pleural effusion is similar in volume to the previous exam. Right upper lobe lung mass appears centrally necrotic and partially cavitary measuring 3.9 x 5.2 cm, image 47/2. Previously this measured 4.2 x 5.4 cm. No new lung lesions identified. The left lower lobe perihilar lung mass now exhibits central necrosis and cavitation measuring 2.3 by 3.0 cm, image 66/2. This is compared with 2.2 x 2.9 cm previously. Upper Abdomen: No acute abnormality identified within the imaged portions of the upper  abdomen. Left adrenal nodule measures 1.2 cm, image 149/2. Unchanged from previous exam. Bilateral kidney cysts are again noted. Musculoskeletal: No chest wall abnormality. No acute or significant osseous findings. IMPRESSION: 1. Interval response to therapy. The left lower lobe perihilar lung mass is not significantly changed in size but now exhibits central necrosis and cavitation. The right upper lobe lung mass appears centrally necrotic and has decreased in size in the interval. Right paratracheal lymph node has decreased in size in the interval. 2. No new sites of disease. 3. Stable right pleural effusion. 4. Three vessel coronary artery atherosclerotic calcifications. 5. Stable left adrenal nodule. 6. Aortic atherosclerosis. Aortic Atherosclerosis (ICD10-I70.0). Electronically Signed   By: TKerby MoorsM.D.   On: 01/26/2020 09:23  ASSESSMENT AND PLAN: This is a very pleasant 79 years old white female with recurrent non-small cell lung cancer presented as a stage IIIa involving the right lower lobe. The patient underwent a course of concurrent chemoradiation with weekly carboplatin and paclitaxel status post 7 cycles.  She tolerated this treatment well. The patient completed a course of consolidation immunotherapy with Imfinzi status post 26 cycles.  She tolerated her treatment well with no concerning complaints. The patient was on observation for several months. Imaging studies including repeat CT scan of the chest as well as a PET scan that showed evidence for disease recurrence in the right lung and metastasis to the left lung. The patient is started systemic chemotherapy with carboplatin for AUC of 5, paclitaxel 175 mg/M2 and Keytruda 200 mg IV every 3 weeks status post 3 cycles.   The patient continues to tolerate her treatment well with no concerning adverse effects except for fatigue and few episodes of diarrhea. She had repeat CT scan of the chest performed recently.  I personally and  independently reviewed the scans and discussed the results with the patient today. Her scan showed further improvement of her disease. I recommended for her to proceed with cycle #4 today as planned. I will see her back for follow-up visit in 3 weeks for evaluation before starting the first cycle of maintenance treatment with single agent Keytruda. For the diarrhea, the patient was advised to take Imodium on as-needed basis and if no improvement to call us.  We may consider her for high dose steroid if she has severe diarrhea. She was advised to call immediately if she has any other concerning symptoms in the interval. The patient voices understanding of current disease status and treatment options and is in agreement with the current care plan. All questions were answered. The patient knows to call the clinic with any problems, questions or concerns. We can certainly see the patient much sooner if necessary.  Disclaimer: This note was dictated with voice recognition software. Similar sounding words can inadvertently be transcribed and may not be corrected upon review.

## 2020-01-26 NOTE — Telephone Encounter (Signed)
Benadryl adverse effect-Hallucinations and abusive behavior. F/u with [name] about pt behavior today .  [name] said Leena cannot take iv benadryl at ANY dose. She will hallucinate and get abusive after IV benadryl. For example , [name] reported " one time she opened the car door while the car was moving-she said I was mean to her."  She said she tolerated po Benadryl.

## 2020-01-26 NOTE — Patient Instructions (Signed)
[facility] Discharge Instructions for Patients Receiving Chemotherapy  Today you received the following chemotherapy agents: Keytruda, Taxol, Carboplatin  To help prevent nausea and vomiting after your treatment, we encourage you to take your nausea medication as directed.   If you develop nausea and vomiting that is not controlled by your nausea medication, call the clinic.   BELOW ARE SYMPTOMS THAT SHOULD BE REPORTED IMMEDIATELY:  *FEVER GREATER THAN 100.5 F  *CHILLS WITH OR WITHOUT FEVER  NAUSEA AND VOMITING THAT IS NOT CONTROLLED WITH YOUR NAUSEA MEDICATION  *UNUSUAL SHORTNESS OF BREATH  *UNUSUAL BRUISING OR BLEEDING  TENDERNESS IN MOUTH AND THROAT WITH OR WITHOUT PRESENCE OF ULCERS  *URINARY PROBLEMS  *BOWEL PROBLEMS  UNUSUAL RASH Items with * indicate a potential emergency and should be followed up as soon as possible.  Feel free to call the clinic should you have any questions or concerns. The clinic phone number is [phone].  Please show the CHEMO ALERT CARD at check-in to the Emergency Department and triage nurse.   

## 2020-01-26 NOTE — Patient Instructions (Signed)
Steps to Quit Smoking Smoking tobacco is the leading cause of preventable death. It can affect almost every organ in the body. Smoking puts you and people around you at risk for many serious, long-lasting (chronic) diseases. Quitting smoking can be hard, but it is one of the best things that you can do for your health. It is never too late to quit. How do I get ready to quit? When you decide to quit smoking, make a plan to help you succeed. Before you quit:  Pick a date to quit. Set a date within the next 2 weeks to give you time to prepare.  Write down the reasons why you are quitting. Keep this list in places where you will see it often.  Tell your family, friends, and co-workers that you are quitting. Their support is important.  Talk with your doctor about the choices that may help you quit.  Find out if your health insurance will pay for these treatments.  Know the people, places, things, and activities that make you want to smoke (triggers). Avoid them. What first steps can I take to quit smoking?  Throw away all cigarettes at home, at work, and in your car.  Throw away the things that you use when you smoke, such as ashtrays and lighters.  Clean your car. Make sure to empty the ashtray.  Clean your home, including curtains and carpets. What can I do to help me quit smoking? Talk with your doctor about taking medicines and seeing a counselor at the same time. You are more likely to succeed when you do both.  If you are pregnant or breastfeeding, talk with your doctor about counseling or other ways to quit smoking. Do not take medicine to help you quit smoking unless your doctor tells you to do so. To quit smoking: Quit right away  Quit smoking totally, instead of slowly cutting back on how much you smoke over a period of time.  Go to counseling. You are more likely to quit if you go to counseling sessions regularly. Take medicine You may take medicines to help you quit. Some  medicines need a prescription, and some you can buy over-the-counter. Some medicines may contain a drug called nicotine to replace the nicotine in cigarettes. Medicines may:  Help you to stop having the desire to smoke (cravings).  Help to stop the problems that come when you stop smoking (withdrawal symptoms). Your doctor may ask you to use:  Nicotine patches, gum, or lozenges.  Nicotine inhalers or sprays.  Non-nicotine medicine that is taken by mouth. Find resources Find resources and other ways to help you quit smoking and remain smoke-free after you quit. These resources are most helpful when you use them often. They include:  Online chats with a counselor.  Phone quitlines.  Printed self-help materials.  Support groups or group counseling.  Text messaging programs.  Mobile phone apps. Use apps on your mobile phone or tablet that can help you stick to your quit plan. There are many free apps for mobile phones and tablets as well as websites. Examples include Quit Guide from the CDC and smokefree.gov  What things can I do to make it easier to quit?   Talk to your family and friends. Ask them to support and encourage you.  Call a phone quitline (1-800-QUIT-NOW), reach out to support groups, or work with a counselor.  Ask people who smoke to not smoke around you.  Avoid places that make you want to smoke,   such as: ? Bars. ? Parties. ? Smoke-break areas at work.  Spend time with people who do not smoke.  Lower the stress in your life. Stress can make you want to smoke. Try these things to help your stress: ? Getting regular exercise. ? Doing deep-breathing exercises. ? Doing yoga. ? Meditating. ? Doing a body scan. To do this, close your eyes, focus on one area of your body at a time from head to toe. Notice which parts of your body are tense. Try to relax the muscles in those areas. How will I feel when I quit smoking? Day 1 to 3 weeks Within the first 24 hours,  you may start to have some problems that come from quitting tobacco. These problems are very bad 2-3 days after you quit, but they do not often last for more than 2-3 weeks. You may get these symptoms:  Mood swings.  Feeling restless, nervous, angry, or annoyed.  Trouble concentrating.  Dizziness.  Strong desire for high-sugar foods and nicotine.  Weight gain.  Trouble pooping (constipation).  Feeling like you may vomit (nausea).  Coughing or a sore throat.  Changes in how the medicines that you take for other issues work in your body.  Depression.  Trouble sleeping (insomnia). Week 3 and afterward After the first 2-3 weeks of quitting, you may start to notice more positive results, such as:  Better sense of smell and taste.  Less coughing and sore throat.  Slower heart rate.  Lower blood pressure.  Clearer skin.  Better breathing.  Fewer sick days. Quitting smoking can be hard. Do not give up if you fail the first time. Some people need to try a few times before they succeed. Do your best to stick to your quit plan, and talk with your doctor if you have any questions or concerns. Summary  Smoking tobacco is the leading cause of preventable death. Quitting smoking can be hard, but it is one of the best things that you can do for your health.  When you decide to quit smoking, make a plan to help you succeed.  Quit smoking right away, not slowly over a period of time.  When you start quitting, seek help from your doctor, family, or friends. This information is not intended to replace advice given to you by your health care provider. Make sure you discuss any questions you have with your health care provider. Document Revised: 03/27/2019 Document Reviewed: 09/20/2018 Elsevier Patient Education  2020 [employer]vier Inc.  

## 2020-01-27 ENCOUNTER — Telehealth: Payer: Self-pay | Admitting: Internal Medicine

## 2020-01-27 NOTE — Telephone Encounter (Signed)
Scheduled appt per 7/13 los - pt daughter aware of appts.

## 2020-01-28 ENCOUNTER — Other Ambulatory Visit: Payer: Self-pay

## 2020-01-28 ENCOUNTER — Inpatient Hospital Stay: Payer: Medicare Other

## 2020-01-28 VITALS — BP 127/58 | HR 97 | Resp 18

## 2020-01-28 DIAGNOSIS — E785 Hyperlipidemia, unspecified: Secondary | ICD-10-CM | POA: Diagnosis not present

## 2020-01-28 DIAGNOSIS — Z5189 Encounter for other specified aftercare: Secondary | ICD-10-CM | POA: Diagnosis not present

## 2020-01-28 DIAGNOSIS — E119 Type 2 diabetes mellitus without complications: Secondary | ICD-10-CM | POA: Diagnosis not present

## 2020-01-28 DIAGNOSIS — Z5111 Encounter for antineoplastic chemotherapy: Secondary | ICD-10-CM | POA: Diagnosis not present

## 2020-01-28 DIAGNOSIS — Z5112 Encounter for antineoplastic immunotherapy: Secondary | ICD-10-CM | POA: Diagnosis not present

## 2020-01-28 DIAGNOSIS — Z79899 Other long term (current) drug therapy: Secondary | ICD-10-CM | POA: Diagnosis not present

## 2020-01-28 DIAGNOSIS — I10 Essential (primary) hypertension: Secondary | ICD-10-CM | POA: Diagnosis not present

## 2020-01-28 DIAGNOSIS — Z7984 Long term (current) use of oral hypoglycemic drugs: Secondary | ICD-10-CM | POA: Diagnosis not present

## 2020-01-28 DIAGNOSIS — Z7982 Long term (current) use of aspirin: Secondary | ICD-10-CM | POA: Diagnosis not present

## 2020-01-28 DIAGNOSIS — Z8673 Personal history of transient ischemic attack (TIA), and cerebral infarction without residual deficits: Secondary | ICD-10-CM | POA: Diagnosis not present

## 2020-01-28 DIAGNOSIS — C3491 Malignant neoplasm of unspecified part of right bronchus or lung: Secondary | ICD-10-CM

## 2020-01-28 DIAGNOSIS — C3411 Malignant neoplasm of upper lobe, right bronchus or lung: Secondary | ICD-10-CM | POA: Diagnosis not present

## 2020-01-28 DIAGNOSIS — Z87891 Personal history of nicotine dependence: Secondary | ICD-10-CM | POA: Diagnosis not present

## 2020-01-28 MED ORDER — PEGFILGRASTIM-CBQV 6 MG/0.6ML ~~LOC~~ SOSY
6.0000 mg | PREFILLED_SYRINGE | Freq: Once | SUBCUTANEOUS | Status: AC
  Administered 2020-01-28: 6 mg via SUBCUTANEOUS

## 2020-01-28 MED ORDER — PEGFILGRASTIM-CBQV 6 MG/0.6ML ~~LOC~~ SOSY
PREFILLED_SYRINGE | SUBCUTANEOUS | Status: AC
  Filled 2020-01-28: qty 0.6

## 2020-01-28 NOTE — Patient Instructions (Signed)

## 2020-02-02 ENCOUNTER — Inpatient Hospital Stay: Payer: Medicare Other

## 2020-02-02 ENCOUNTER — Other Ambulatory Visit: Payer: Self-pay

## 2020-02-02 DIAGNOSIS — Z7984 Long term (current) use of oral hypoglycemic drugs: Secondary | ICD-10-CM | POA: Diagnosis not present

## 2020-02-02 DIAGNOSIS — Z5112 Encounter for antineoplastic immunotherapy: Secondary | ICD-10-CM | POA: Diagnosis not present

## 2020-02-02 DIAGNOSIS — E119 Type 2 diabetes mellitus without complications: Secondary | ICD-10-CM | POA: Diagnosis not present

## 2020-02-02 DIAGNOSIS — C3491 Malignant neoplasm of unspecified part of right bronchus or lung: Secondary | ICD-10-CM

## 2020-02-02 DIAGNOSIS — Z5111 Encounter for antineoplastic chemotherapy: Secondary | ICD-10-CM | POA: Diagnosis not present

## 2020-02-02 DIAGNOSIS — Z8673 Personal history of transient ischemic attack (TIA), and cerebral infarction without residual deficits: Secondary | ICD-10-CM | POA: Diagnosis not present

## 2020-02-02 DIAGNOSIS — Z87891 Personal history of nicotine dependence: Secondary | ICD-10-CM | POA: Diagnosis not present

## 2020-02-02 DIAGNOSIS — Z7982 Long term (current) use of aspirin: Secondary | ICD-10-CM | POA: Diagnosis not present

## 2020-02-02 DIAGNOSIS — Z79899 Other long term (current) drug therapy: Secondary | ICD-10-CM | POA: Diagnosis not present

## 2020-02-02 DIAGNOSIS — Z5189 Encounter for other specified aftercare: Secondary | ICD-10-CM | POA: Diagnosis not present

## 2020-02-02 DIAGNOSIS — E785 Hyperlipidemia, unspecified: Secondary | ICD-10-CM | POA: Diagnosis not present

## 2020-02-02 DIAGNOSIS — C3411 Malignant neoplasm of upper lobe, right bronchus or lung: Secondary | ICD-10-CM | POA: Diagnosis not present

## 2020-02-02 DIAGNOSIS — I10 Essential (primary) hypertension: Secondary | ICD-10-CM | POA: Diagnosis not present

## 2020-02-02 LAB — CBC WITH DIFFERENTIAL (CANCER CENTER ONLY)
Abs Immature Granulocytes: 0.82 10*3/uL — ABNORMAL HIGH (ref 0.00–0.07)
Basophils Absolute: 0 10*3/uL (ref 0.0–0.1)
Basophils Relative: 0 %
Eosinophils Absolute: 0 10*3/uL (ref 0.0–0.5)
Eosinophils Relative: 0 %
HCT: 30.2 % — ABNORMAL LOW (ref 36.0–46.0)
Hemoglobin: 9.5 g/dL — ABNORMAL LOW (ref 12.0–15.0)
Immature Granulocytes: 2 %
Lymphocytes Relative: 4 %
Lymphs Abs: 1.6 10*3/uL (ref 0.7–4.0)
MCH: 28.1 pg (ref 26.0–34.0)
MCHC: 31.5 g/dL (ref 30.0–36.0)
MCV: 89.3 fL (ref 80.0–100.0)
Monocytes Absolute: 2 10*3/uL — ABNORMAL HIGH (ref 0.1–1.0)
Monocytes Relative: 5 %
Neutro Abs: 37.6 10*3/uL — ABNORMAL HIGH (ref 1.7–7.7)
Neutrophils Relative %: 89 %
Platelet Count: 411 10*3/uL — ABNORMAL HIGH (ref 150–400)
RBC: 3.38 MIL/uL — ABNORMAL LOW (ref 3.87–5.11)
RDW: 18.6 % — ABNORMAL HIGH (ref 11.5–15.5)
WBC Count: 42.1 10*3/uL — ABNORMAL HIGH (ref 4.0–10.5)
nRBC: 0 % (ref 0.0–0.2)

## 2020-02-02 LAB — CMP (CANCER CENTER ONLY)
ALT: <6 U/L (ref 0–44)
AST: 11 U/L — ABNORMAL LOW (ref 15–41)
Albumin: 3.2 g/dL — ABNORMAL LOW (ref 3.5–5.0)
Alkaline Phosphatase: 164 U/L — ABNORMAL HIGH (ref 38–126)
Anion gap: 9 (ref 5–15)
BUN: 13 mg/dL (ref 8–23)
CO2: 28 mmol/L (ref 22–32)
Calcium: 10.9 mg/dL — ABNORMAL HIGH (ref 8.9–10.3)
Chloride: 100 mmol/L (ref 98–111)
Creatinine: 0.8 mg/dL (ref 0.44–1.00)
GFR, Est AFR Am: >60 mL/min (ref >60)
GFR, Estimated: >60 mL/min (ref >60)
Glucose, Bld: 126 mg/dL — ABNORMAL HIGH (ref 70–99)
Potassium: 4.2 mmol/L (ref 3.5–5.1)
Sodium: 137 mmol/L (ref 135–145)
Total Bilirubin: 0.3 mg/dL (ref 0.3–1.2)
Total Protein: 6.9 g/dL (ref 6.5–8.1)

## 2020-02-04 ENCOUNTER — Other Ambulatory Visit: Payer: Self-pay | Admitting: Family Medicine

## 2020-02-04 DIAGNOSIS — E1169 Type 2 diabetes mellitus with other specified complication: Secondary | ICD-10-CM

## 2020-02-08 ENCOUNTER — Encounter: Payer: Self-pay | Admitting: Family Medicine

## 2020-02-08 ENCOUNTER — Other Ambulatory Visit: Payer: Self-pay

## 2020-02-08 ENCOUNTER — Ambulatory Visit (INDEPENDENT_AMBULATORY_CARE_PROVIDER_SITE_OTHER): Payer: Medicare Other | Admitting: Family Medicine

## 2020-02-08 VITALS — BP 90/64 | HR 86 | Temp 98.2°F | Wt 118.2 lb

## 2020-02-08 DIAGNOSIS — I10 Essential (primary) hypertension: Secondary | ICD-10-CM

## 2020-02-08 DIAGNOSIS — E538 Deficiency of other specified B group vitamins: Secondary | ICD-10-CM

## 2020-02-08 DIAGNOSIS — E1159 Type 2 diabetes mellitus with other circulatory complications: Secondary | ICD-10-CM

## 2020-02-08 DIAGNOSIS — E785 Hyperlipidemia, unspecified: Secondary | ICD-10-CM

## 2020-02-08 DIAGNOSIS — C3491 Malignant neoplasm of unspecified part of right bronchus or lung: Secondary | ICD-10-CM | POA: Diagnosis not present

## 2020-02-08 DIAGNOSIS — T50905A Adverse effect of unspecified drugs, medicaments and biological substances, initial encounter: Secondary | ICD-10-CM

## 2020-02-08 DIAGNOSIS — E1169 Type 2 diabetes mellitus with other specified complication: Secondary | ICD-10-CM

## 2020-02-08 DIAGNOSIS — L658 Other specified nonscarring hair loss: Secondary | ICD-10-CM

## 2020-02-08 DIAGNOSIS — R634 Abnormal weight loss: Secondary | ICD-10-CM

## 2020-02-08 LAB — POCT GLYCOSYLATED HEMOGLOBIN (HGB A1C): Hemoglobin A1C: 7.3 % — AB (ref 4.0–5.6)

## 2020-02-08 MED ORDER — CYANOCOBALAMIN 1000 MCG/ML IJ SOLN: 1000.0000 ug | Freq: Once | INTRAMUSCULAR | Status: AC

## 2020-02-08 MED ORDER — CYANOCOBALAMIN 1000 MCG/ML IJ SOLN
1000.0000 ug | Freq: Once | INTRAMUSCULAR | Status: AC
Start: 2020-02-08 — End: 2020-02-08
  Administered 2020-02-08: 1000 ug via INTRAMUSCULAR

## 2020-02-08 NOTE — Progress Notes (Signed)
Subjective:    Patient ID: [name], female    DOB: 1940/10/25, 79 y.o.   MRN: [id number]  KENSY BLIZARD is a 79 y.o. female who presents for follow-up of Type 2 diabetes mellitus.  Home blood sugar records: meter record, after meal Current symptoms/problems include none at this time. Daily foot checks: yes   Any foot concerns: none Exercise: walking in house Diet: poor ( per pt food taste different with chemo)  She is scheduled for her last course of chemotherapy and then will be switched over to [facility].  She has lost a lot of weight as well as her hair.  She has very little energy.  She would like to do some more gambling but states that she does not have the energy to do this.  She continues on Janumet xr as well as pioglitazone.  Lisinopril was causing no difficulty. The following portions of the patient's history were reviewed and updated as appropriate: allergies, current medications, past medical history, past social history and problem list.  ROS as in subjective above.     Objective:    Physical Exam Alert and in no distress.  Somewhat of a flat affect.  Extensive hair loss is noted.  Her weight is also down.  Lab Review Diabetic Labs Latest Ref Rng & Units 02/02/2020 01/26/2020 01/20/2020 01/13/2020 01/06/2020  HbA1c 4.0 - 5.6 % - - - - -  Microalbumin mg/L - - - - -  Micro/Creat Ratio - - - - - -  Chol 0 - 200 mg/dL - - - - -  HDL >40 mg/dL - - - - -  Calc LDL 0 - 99 mg/dL - - - - -  Triglycerides <150 mg/dL - - - - -  Creatinine 0.44 - 1.00 mg/dL 0.80 0.77 0.83 0.75 0.82   BP/Weight 01/28/2020 01/26/2020 01/08/2020 4/78/2956 08/16/3084  Systolic BP 578 469 629 528 413  Diastolic BP 58 61 66 71 56  Wt. (Lbs) - 120.3 - 127.1 -  BMI - 19.42 - 20.51 -   Foot/eye exam completion dates Latest Ref Rng & Units 03/08/2015 04/15/2014  Eye Exam No Retinopathy - No Retinopathy  Foot Form Completion - Done -  A1c is 7.2 Rosemary  reports that she has been smoking cigarettes.  She has a 30.00 pack-year smoking history. She has never used smokeless tobacco. She reports that she does not drink alcohol and does not use drugs.     Assessment & Plan:    Stage IV squamous cell carcinoma of right lung (HCC)  Hypertension associated with diabetes ([place])  Hyperlipidemia associated with type 2 diabetes mellitus ([place])  DM type 2 with diabetic dyslipidemia ([place]) - Plan: POCT glycosylated hemoglobin (Hb A1C)  Drug-induced weight loss  Drug-related hair loss  Vitamin B12 deficiency - Plan: cyanocobalamin ((VITAMIN B-12)) injection 1,000 mcg, cyanocobalamin ((VITAMIN B-12)) injection 1,000 mcg   1. Rx changes: none 2. Education: Reviewed 'ABCs' of diabetes management (respective goals in parentheses):  A1C (<7), blood pressure (<130/80), and cholesterol (LDL <100). 3. Compliance at present is estimated to be good. Efforts to improve compliance (if necessary) will be directed at Maintaining status quo. 4. Follow up: 4 months I discussed where she was psychologically and her main concern is trying to feel better.  She seems to have a fairly good handle on what is happening in her life.  Strongly encouraged her to do fun and positive things.  Suggested that she go to [facility] since she likes to  gamble.  Also suggested that she cut her hair off and get a wig and make it a fun experience.  Also encouraged her to go ahead and have that banana split she wants

## 2020-02-08 NOTE — Patient Instructions (Addendum)
In about a month go to [facility] and get a  banana split tomorrow.  Go get l Tonita Cong wig

## 2020-02-09 ENCOUNTER — Other Ambulatory Visit: Payer: Self-pay

## 2020-02-09 ENCOUNTER — Inpatient Hospital Stay: Payer: Medicare Other

## 2020-02-09 DIAGNOSIS — Z8673 Personal history of transient ischemic attack (TIA), and cerebral infarction without residual deficits: Secondary | ICD-10-CM | POA: Diagnosis not present

## 2020-02-09 DIAGNOSIS — Z79899 Other long term (current) drug therapy: Secondary | ICD-10-CM | POA: Diagnosis not present

## 2020-02-09 DIAGNOSIS — Z7984 Long term (current) use of oral hypoglycemic drugs: Secondary | ICD-10-CM | POA: Diagnosis not present

## 2020-02-09 DIAGNOSIS — Z87891 Personal history of nicotine dependence: Secondary | ICD-10-CM | POA: Diagnosis not present

## 2020-02-09 DIAGNOSIS — Z5111 Encounter for antineoplastic chemotherapy: Secondary | ICD-10-CM | POA: Diagnosis not present

## 2020-02-09 DIAGNOSIS — C3491 Malignant neoplasm of unspecified part of right bronchus or lung: Secondary | ICD-10-CM

## 2020-02-09 DIAGNOSIS — E785 Hyperlipidemia, unspecified: Secondary | ICD-10-CM | POA: Diagnosis not present

## 2020-02-09 DIAGNOSIS — I10 Essential (primary) hypertension: Secondary | ICD-10-CM | POA: Diagnosis not present

## 2020-02-09 DIAGNOSIS — Z5112 Encounter for antineoplastic immunotherapy: Secondary | ICD-10-CM | POA: Diagnosis not present

## 2020-02-09 DIAGNOSIS — Z7982 Long term (current) use of aspirin: Secondary | ICD-10-CM | POA: Diagnosis not present

## 2020-02-09 DIAGNOSIS — C3411 Malignant neoplasm of upper lobe, right bronchus or lung: Secondary | ICD-10-CM | POA: Diagnosis not present

## 2020-02-09 DIAGNOSIS — Z5189 Encounter for other specified aftercare: Secondary | ICD-10-CM | POA: Diagnosis not present

## 2020-02-09 DIAGNOSIS — E119 Type 2 diabetes mellitus without complications: Secondary | ICD-10-CM | POA: Diagnosis not present

## 2020-02-09 LAB — CBC WITH DIFFERENTIAL (CANCER CENTER ONLY)
Abs Immature Granulocytes: 0.35 10*3/uL — ABNORMAL HIGH (ref 0.00–0.07)
Basophils Absolute: 0.1 10*3/uL (ref 0.0–0.1)
Basophils Relative: 0 %
Eosinophils Absolute: 0 10*3/uL (ref 0.0–0.5)
Eosinophils Relative: 0 %
HCT: 30 % — ABNORMAL LOW (ref 36.0–46.0)
Hemoglobin: 9.5 g/dL — ABNORMAL LOW (ref 12.0–15.0)
Immature Granulocytes: 2 %
Lymphocytes Relative: 5 %
Lymphs Abs: 1.3 10*3/uL (ref 0.7–4.0)
MCH: 28.3 pg (ref 26.0–34.0)
MCHC: 31.7 g/dL (ref 30.0–36.0)
MCV: 89.3 fL (ref 80.0–100.0)
Monocytes Absolute: 1.1 10*3/uL — ABNORMAL HIGH (ref 0.1–1.0)
Monocytes Relative: 4 %
Neutro Abs: 21.1 10*3/uL — ABNORMAL HIGH (ref 1.7–7.7)
Neutrophils Relative %: 89 %
Platelet Count: 408 10*3/uL — ABNORMAL HIGH (ref 150–400)
RBC: 3.36 MIL/uL — ABNORMAL LOW (ref 3.87–5.11)
RDW: 19.4 % — ABNORMAL HIGH (ref 11.5–15.5)
WBC Count: 23.9 10*3/uL — ABNORMAL HIGH (ref 4.0–10.5)
nRBC: 0 % (ref 0.0–0.2)

## 2020-02-09 LAB — CMP (CANCER CENTER ONLY)
ALT: 7 U/L (ref 0–44)
AST: 9 U/L — ABNORMAL LOW (ref 15–41)
Albumin: 3 g/dL — ABNORMAL LOW (ref 3.5–5.0)
Alkaline Phosphatase: 140 U/L — ABNORMAL HIGH (ref 38–126)
Anion gap: 10 (ref 5–15)
BUN: 9 mg/dL (ref 8–23)
CO2: 28 mmol/L (ref 22–32)
Calcium: 9.9 mg/dL (ref 8.9–10.3)
Chloride: 101 mmol/L (ref 98–111)
Creatinine: 0.77 mg/dL (ref 0.44–1.00)
GFR, Est AFR Am: >60 mL/min (ref >60)
GFR, Estimated: >60 mL/min (ref >60)
Glucose, Bld: 127 mg/dL — ABNORMAL HIGH (ref 70–99)
Potassium: 4.2 mmol/L (ref 3.5–5.1)
Sodium: 139 mmol/L (ref 135–145)
Total Bilirubin: 0.3 mg/dL (ref 0.3–1.2)
Total Protein: 6.9 g/dL (ref 6.5–8.1)

## 2020-02-16 ENCOUNTER — Inpatient Hospital Stay: Payer: Medicare Other | Attending: Internal Medicine

## 2020-02-16 ENCOUNTER — Inpatient Hospital Stay: Payer: Medicare Other

## 2020-02-16 ENCOUNTER — Other Ambulatory Visit: Payer: Self-pay

## 2020-02-16 ENCOUNTER — Encounter: Payer: Self-pay | Admitting: Internal Medicine

## 2020-02-16 ENCOUNTER — Inpatient Hospital Stay: Payer: Medicare Other | Admitting: Nutrition

## 2020-02-16 ENCOUNTER — Inpatient Hospital Stay: Payer: Medicare Other | Admitting: Internal Medicine

## 2020-02-16 VITALS — BP 133/57 | HR 99 | Temp 97.6°F | Resp 18 | Ht 66.0 in | Wt 118.6 lb

## 2020-02-16 DIAGNOSIS — Z5112 Encounter for antineoplastic immunotherapy: Secondary | ICD-10-CM | POA: Diagnosis not present

## 2020-02-16 DIAGNOSIS — C3491 Malignant neoplasm of unspecified part of right bronchus or lung: Secondary | ICD-10-CM | POA: Diagnosis not present

## 2020-02-16 DIAGNOSIS — Z9049 Acquired absence of other specified parts of digestive tract: Secondary | ICD-10-CM | POA: Insufficient documentation

## 2020-02-16 DIAGNOSIS — E119 Type 2 diabetes mellitus without complications: Secondary | ICD-10-CM | POA: Diagnosis not present

## 2020-02-16 DIAGNOSIS — E785 Hyperlipidemia, unspecified: Secondary | ICD-10-CM | POA: Diagnosis not present

## 2020-02-16 DIAGNOSIS — I10 Essential (primary) hypertension: Secondary | ICD-10-CM | POA: Insufficient documentation

## 2020-02-16 DIAGNOSIS — C3411 Malignant neoplasm of upper lobe, right bronchus or lung: Secondary | ICD-10-CM | POA: Insufficient documentation

## 2020-02-16 DIAGNOSIS — Z7984 Long term (current) use of oral hypoglycemic drugs: Secondary | ICD-10-CM | POA: Insufficient documentation

## 2020-02-16 DIAGNOSIS — Z87891 Personal history of nicotine dependence: Secondary | ICD-10-CM | POA: Insufficient documentation

## 2020-02-16 DIAGNOSIS — Z7982 Long term (current) use of aspirin: Secondary | ICD-10-CM | POA: Diagnosis not present

## 2020-02-16 DIAGNOSIS — Z9221 Personal history of antineoplastic chemotherapy: Secondary | ICD-10-CM | POA: Insufficient documentation

## 2020-02-16 DIAGNOSIS — Z79899 Other long term (current) drug therapy: Secondary | ICD-10-CM | POA: Insufficient documentation

## 2020-02-16 DIAGNOSIS — Z95828 Presence of other vascular implants and grafts: Secondary | ICD-10-CM

## 2020-02-16 DIAGNOSIS — Z923 Personal history of irradiation: Secondary | ICD-10-CM | POA: Insufficient documentation

## 2020-02-16 DIAGNOSIS — Z8673 Personal history of transient ischemic attack (TIA), and cerebral infarction without residual deficits: Secondary | ICD-10-CM | POA: Diagnosis not present

## 2020-02-16 DIAGNOSIS — K219 Gastro-esophageal reflux disease without esophagitis: Secondary | ICD-10-CM | POA: Insufficient documentation

## 2020-02-16 LAB — CBC WITH DIFFERENTIAL (CANCER CENTER ONLY)
Abs Immature Granulocytes: 0.12 10*3/uL — ABNORMAL HIGH (ref 0.00–0.07)
Basophils Absolute: 0 10*3/uL (ref 0.0–0.1)
Basophils Relative: 0 %
Eosinophils Absolute: 0.1 10*3/uL (ref 0.0–0.5)
Eosinophils Relative: 0 %
HCT: 29.7 % — ABNORMAL LOW (ref 36.0–46.0)
Hemoglobin: 9.5 g/dL — ABNORMAL LOW (ref 12.0–15.0)
Immature Granulocytes: 1 %
Lymphocytes Relative: 7 %
Lymphs Abs: 1.4 10*3/uL (ref 0.7–4.0)
MCH: 29 pg (ref 26.0–34.0)
MCHC: 32 g/dL (ref 30.0–36.0)
MCV: 90.5 fL (ref 80.0–100.0)
Monocytes Absolute: 1.1 10*3/uL — ABNORMAL HIGH (ref 0.1–1.0)
Monocytes Relative: 6 %
Neutro Abs: 17.4 10*3/uL — ABNORMAL HIGH (ref 1.7–7.7)
Neutrophils Relative %: 86 %
Platelet Count: 491 10*3/uL — ABNORMAL HIGH (ref 150–400)
RBC: 3.28 MIL/uL — ABNORMAL LOW (ref 3.87–5.11)
RDW: 20.3 % — ABNORMAL HIGH (ref 11.5–15.5)
WBC Count: 20.1 10*3/uL — ABNORMAL HIGH (ref 4.0–10.5)
nRBC: 0 % (ref 0.0–0.2)

## 2020-02-16 LAB — TSH: TSH: 1.179 u[IU]/mL (ref 0.308–3.960)

## 2020-02-16 LAB — CMP (CANCER CENTER ONLY)
ALT: <6 U/L (ref 0–44)
AST: 11 U/L — ABNORMAL LOW (ref 15–41)
Albumin: 2.9 g/dL — ABNORMAL LOW (ref 3.5–5.0)
Alkaline Phosphatase: 99 U/L (ref 38–126)
Anion gap: 9 (ref 5–15)
BUN: 12 mg/dL (ref 8–23)
CO2: 26 mmol/L (ref 22–32)
Calcium: 9.8 mg/dL (ref 8.9–10.3)
Chloride: 103 mmol/L (ref 98–111)
Creatinine: 0.76 mg/dL (ref 0.44–1.00)
GFR, Est AFR Am: >60 mL/min (ref >60)
GFR, Estimated: >60 mL/min (ref >60)
Glucose, Bld: 119 mg/dL — ABNORMAL HIGH (ref 70–99)
Potassium: 4.1 mmol/L (ref 3.5–5.1)
Sodium: 138 mmol/L (ref 135–145)
Total Bilirubin: 0.3 mg/dL (ref 0.3–1.2)
Total Protein: 6.9 g/dL (ref 6.5–8.1)

## 2020-02-16 MED ORDER — SODIUM CHLORIDE 0.9% FLUSH
10.0000 mL | INTRAVENOUS | Status: DC | PRN
  Administered 2020-02-16: 10 mL
  Filled 2020-02-16: qty 10

## 2020-02-16 MED ORDER — SODIUM CHLORIDE 0.9 % IV SOLN
Freq: Once | INTRAVENOUS | Status: AC
  Filled 2020-02-16: qty 250

## 2020-02-16 MED ORDER — ALTEPLASE 2 MG IJ SOLR
2.0000 mg | Freq: Once | INTRAMUSCULAR | Status: AC | PRN
  Administered 2020-02-16: 2 mg
  Filled 2020-02-16: qty 2

## 2020-02-16 MED ORDER — SODIUM CHLORIDE 0.9% FLUSH
10.0000 mL | INTRAVENOUS | Status: DC | PRN
  Filled 2020-02-16: qty 10

## 2020-02-16 MED ORDER — SODIUM CHLORIDE 0.9 % IV SOLN
200.0000 mg | Freq: Once | INTRAVENOUS | Status: AC
  Administered 2020-02-16: 200 mg via INTRAVENOUS
  Filled 2020-02-16: qty 8

## 2020-02-16 MED ORDER — HEPARIN SOD (PORK) LOCK FLUSH 100 UNIT/ML IV SOLN
500.0000 [IU] | Freq: Once | INTRAVENOUS | Status: AC | PRN
  Administered 2020-02-16: 500 [IU]
  Filled 2020-02-16: qty 5

## 2020-02-16 MED ORDER — ALTEPLASE 2 MG IJ SOLR
INTRAMUSCULAR | Status: AC
  Filled 2020-02-16: qty 2

## 2020-02-16 NOTE — Progress Notes (Signed)
    [facility] [phone]   [phone]  OFFICE PROGRESS NOTE  Lalonde, [name], MD [address] [place] [address]  DIAGNOSIS:Recurrent non-small cell lung cancer likely squamous cell carcinoma that was initially diagnosed as a stage IIA(T2b, N0, M0) inAugust 2018status post curative stereotactic radiotherapy completed June 21, 2017. The patient presented today with concerning findings for disease recurrence and progression with enlarging and hypermetabolic right upper lobe lung mass presenting as stage IIIa (T3, N0, M0).  PDL 1 expression 0%.  PRIOR THERAPY: 1) Curative stereotactic radiotherapy completed on June 21, 2017. 2) A course of concurrent chemoradiation with chemotherapy consisting of carboplatin for an AUC of 2 and paclitaxel 45 mg/m.  First dose started on 06/23/2018.  Status post 7 cycles.  Last dose of chemotherapy was given August 04, 2018. 3) Consolidation immunotherapy with Imfinzi 10 mg/KG every 2 weeks.  First dose September 09, 2018.  Status post 26 cycles.   CURRENT THERAPY: Systemic chemotherapy with carboplatin for AUC of 5, paclitaxel 175 NG/M2 and Keytruda 200 mg IV every 3 weeks.  First dose Nov 25, 2019.  Status post 4 cycles.  INTERVAL HISTORY: [name] 79 y.o. female returns to the clinic today for follow-up visit.  The patient is feeling fine today with no concerning complaints except for mild fatigue.  She tolerated the last cycle of her systemic chemotherapy with carboplatin, paclitaxel and Keytruda fairly well.  She denied having any current chest pain but has shortness of breath with exertion with no cough or hemoptysis.  She denied having any fever or chills.  She has no nausea, vomiting, diarrhea or constipation.  She is here today for evaluation before starting the first cycle of her maintenance treatment with single agent Keytruda.   MEDICAL HISTORY: Past Medical History:   Diagnosis Date  . Carotid artery occlusion   . Clostridium difficile infection 06/2013  . Diabetes mellitus    takes Janumet daily  . Dyslipidemia    takes Crestor daily  . Gallstones   . GERD (gastroesophageal reflux disease)   . Heart murmur    hx of  . History of radiation therapy 06/11/17-06/21/17   right lung 50 Gy in 5 fractions  . Hypertension    takes Prinzide and Verapamil daily  . Impaired speech    from stroke  . Neuropathy, diabetic (HCC)   . nscl ca dx'd 2018   Lung   . Pneumonia   . PONV (postoperative nausea and vomiting)   . Seizures (HCC) 04/19/2018   recent hospitalization  . Smoker   . Stroke (HCC) 06/25/13  . Vertigo    but doesn't take any meds    ALLERGIES:  is allergic to codeine, benadryl [diphenhydramine], lipitor [atorvastatin], and phenergan [promethazine hcl].  MEDICATIONS:  Current Outpatient Medications  Medication Sig Dispense Refill  . aspirin EC 81 MG tablet Take 81 mg by mouth daily.    . Blood Glucose Monitoring Suppl (ONE TOUCH ULTRA 2) w/Device KIT 1 Device by Does not apply route 2 (two) times daily. 1 each 0  . cholecalciferol (VITAMIN D3) 25 MCG (1000 UNIT) tablet Take 1,000 Units by mouth daily.    . divalproex (DEPAKOTE ER) 500 MG 24 hr tablet Take one tablet  twice daily 60 tablet 6  . glucose blood (ONE TOUCH ULTRA TEST) test strip Use as instructed 100 each 12  . JANUMET XR 50-1000 MG TB24 TAKE 1 TABLET BY MOUTH DAILY 60 tablet 5  .   Lancets (ONETOUCH ULTRASOFT) lancets Use as instructed 100 each 12  . lidocaine-prilocaine (EMLA) cream APPLY TO THE AFFECTED AREA DAILY AS NEEDED (Patient taking differently: Apply 1 application topically daily as needed (Pain). ) 30 g 0  . lisinopril (ZESTRIL) 10 MG tablet TAKE 1 TABLET(10 MG) BY MOUTH DAILY 90 tablet 3  . Multiple Vitamin (MULTIVITAMIN) tablet Take 1 tablet by mouth daily.    . pioglitazone (ACTOS) 30 MG tablet TAKE 1 TABLET(30 MG) BY MOUTH DAILY 90 tablet 1  . prochlorperazine  (COMPAZINE) 10 MG tablet Take 1 tablet (10 mg total) by mouth every 6 (six) hours as needed for nausea or vomiting. (Patient not taking: Reported on 08/25/2019) 30 tablet 0  . rosuvastatin (CRESTOR) 20 MG tablet TAKE 1 TABLET BY MOUTH ONCE DAILY 180 tablet 0  . vitamin B-12 (CYANOCOBALAMIN) 1000 MCG tablet Take 1,000 mcg by mouth daily.     Current Facility-Administered Medications  Medication Dose Route Frequency Provider Last Rate Last Admin  . cyanocobalamin ((VITAMIN B-12)) injection 1,000 mcg  1,000 mcg Intramuscular Once [name], MD       Facility-Administered Medications Ordered in Other Visits  Medication Dose Route Frequency Provider Last Rate Last Admin  . heparin lock flush 100 unit/mL  500 Units Intracatheter Once PRN Curt Bears, MD      . sodium chloride flush (NS) 0.9 % injection 10 mL  10 mL Intracatheter PRN Curt Bears, MD      . sodium chloride flush (NS) 0.9 % injection 10 mL  10 mL Intracatheter PRN Curt Bears, MD        SURGICAL HISTORY:  Past Surgical History:  Procedure Laterality Date  . cataract removed Right   . CHOLECYSTECTOMY    . COLONOSCOPY    . ENDARTERECTOMY Left 07/14/2013   Procedure: ENDARTERECTOMY CAROTID-LEFT;  Surgeon: Elam Dutch, MD;  Location: [facility];  Service: Vascular;  Laterality: Left;  . EYE SURGERY Left    cataract  . IR CV LINE INJECTION  05/13/2019  . IR IMAGING GUIDED PORT INSERTION  09/15/2018  . IR PATIENT EVAL TECH 0-60 MINS  05/08/2019  . IR PERC PLEURAL DRAIN W/INDWELL CATH W/IMG GUIDE  05/23/2017  . KNEE SURGERY Left 46yr ago  . LAPAROSCOPIC PARTIAL COLECTOMY N/A 09/02/2015   Procedure: LAPAROSCOPIC PARTIAL RIGHT COLECTOMY;  Surgeon: ALeighton Ruff MD;  Location: [facility];  Service: General;  Laterality: N/A;  . PATCH ANGIOPLASTY Left 07/14/2013   Procedure: LEFT CAROTID ARTERY PATCH ANGIOPLASTY;  Surgeon: CElam Dutch MD;  [place] [facility]  Service: Vascular;  Laterality: Left;  .Marland KitchenVIDEO BRONCHOSCOPY  N/A 06/04/2018   Procedure: VIDEO BRONCHOSCOPY;  Surgeon: HMelrose Nakayama MD;  [facility];  Service: Thoracic;  Laterality: N/A;  . wisdom      REVIEW OF SYSTEMS:  A comprehensive review of systems was negative except for: Constitutional: positive for fatigue Respiratory: positive for dyspnea on exertion   PHYSICAL EXAMINATION: General appearance: alert, cooperative, fatigued and no distress Head: Normocephalic, without obvious abnormality, atraumatic Neck: no adenopathy, no JVD, supple, symmetrical, trachea midline and thyroid not enlarged, symmetric, no tenderness/mass/nodules Lymph nodes: Cervical, supraclavicular, and axillary nodes normal. Resp: clear to auscultation bilaterally Back: symmetric, no curvature. ROM normal. No CVA tenderness. Cardio: regular rate and rhythm, S1, S2 normal, no murmur, click, rub or gallop GI: soft, non-tender; bowel sounds normal; no masses,  no organomegaly Extremities: extremities normal, atraumatic, no cyanosis or edema  ECOG PERFORMANCE STATUS: 1 - Symptomatic but completely  ambulatory  Blood pressure (!) 133/57, pulse 99, temperature 97.6 F (36.4 C), temperature source Temporal, resp. rate 18, height 5' 6" (1.676 m), weight 118 lb 9.6 oz (53.8 kg), SpO2 98 %.  LABORATORY DATA: Lab Results  Component Value Date   WBC 23.9 (H) 02/09/2020   HGB 9.5 (L) 02/09/2020   HCT 30.0 (L) 02/09/2020   MCV 89.3 02/09/2020   PLT 408 (H) 02/09/2020      Chemistry      Component Value Date/Time   NA 139 02/09/2020 1021   NA 139 05/07/2018 1147   K 4.2 02/09/2020 1021   CL 101 02/09/2020 1021   CO2 28 02/09/2020 1021   BUN 9 02/09/2020 1021   BUN 15 05/07/2018 1147   CREATININE 0.77 02/09/2020 1021   CREATININE 0.87 03/08/2015 0001      Component Value Date/Time   CALCIUM 9.9 02/09/2020 1021   ALKPHOS 140 (H) 02/09/2020 1021   AST 9 (L) 02/09/2020 1021   ALT 7 02/09/2020 1021   BILITOT 0.3 02/09/2020 1021       RADIOGRAPHIC  STUDIES: CT Chest W Contrast  Result Date: 01/26/2020 CLINICAL DATA:  Restaging non-small cell lung cancer. Loss of weight. EXAM: CT CHEST WITH CONTRAST TECHNIQUE: Multidetector CT imaging of the chest was performed during intravenous contrast administration. CONTRAST:  75mL OMNIPAQUE IOHEXOL 300 MG/ML  SOLN COMPARISON:  PET-CT 11/17/2019 FINDINGS: Cardiovascular: Normal heart size. No pericardial effusion. Aortic atherosclerosis. Three vessel coronary artery atherosclerotic calcifications. Mediastinum/Nodes: Normal appearance of the thyroid gland. The trachea appears patent and is midline. Normal appearance of the esophagus. Previously FDG avid right paratracheal lymph node measures 0.7 cm, image 54/2. This is compared with 1.1 cm previously. Right suprahilar lymph node measures 1.5 cm, image 62/2.  Unchanged. Adjacent right suprahilar lymph node measures 1 cm, image 62/2. Unchanged. Lungs/Pleura: Right pleural effusion is similar in volume to the previous exam. Right upper lobe lung mass appears centrally necrotic and partially cavitary measuring 3.9 x 5.2 cm, image 47/2. Previously this measured 4.2 x 5.4 cm. No new lung lesions identified. The left lower lobe perihilar lung mass now exhibits central necrosis and cavitation measuring 2.3 by 3.0 cm, image 66/2. This is compared with 2.2 x 2.9 cm previously. Upper Abdomen: No acute abnormality identified within the imaged portions of the upper abdomen. Left adrenal nodule measures 1.2 cm, image 149/2. Unchanged from previous exam. Bilateral kidney cysts are again noted. Musculoskeletal: No chest wall abnormality. No acute or significant osseous findings. IMPRESSION: 1. Interval response to therapy. The left lower lobe perihilar lung mass is not significantly changed in size but now exhibits central necrosis and cavitation. The right upper lobe lung mass appears centrally necrotic and has decreased in size in the interval. Right paratracheal lymph node has  decreased in size in the interval. 2. No new sites of disease. 3. Stable right pleural effusion. 4. Three vessel coronary artery atherosclerotic calcifications. 5. Stable left adrenal nodule. 6. Aortic atherosclerosis. Aortic Atherosclerosis (ICD10-I70.0). Electronically Signed   By: Taylor  Stroud M.D.   On: 01/26/2020 09:23    ASSESSMENT AND PLAN: This is a very pleasant 79 years old white female with recurrent non-small cell lung cancer presented as a stage IIIa involving the right lower lobe. The patient underwent a course of concurrent chemoradiation with weekly carboplatin and paclitaxel status post 7 cycles.  She tolerated this treatment well. The patient completed a course of consolidation immunotherapy with Imfinzi status post 26 cycles.  She tolerated her   treatment well with no concerning complaints. The patient was on observation for several months. Imaging studies including repeat CT scan of the chest as well as a PET scan that showed evidence for disease recurrence in the right lung and metastasis to the left lung. The patient is started systemic chemotherapy with carboplatin for AUC of 5, paclitaxel 175 mg/M2 and Keytruda 200 mg IV every 3 weeks status post 4 cycles.   The patient continues to tolerate her treatment well with no concerning adverse effects. I recommended for her to proceed with the first cycle of her maintenance treatment with Keytruda 200 mg IV every 3 weeks. She will come back for follow-up visit in 3 weeks for evaluation before the next round of her treatment. The patient was advised to call immediately if she has any other concerning symptoms in the interval. The patient voices understanding of current disease status and treatment options and is in agreement with the current care plan. All questions were answered. The patient knows to call the clinic with any problems, questions or concerns. We can certainly see the patient much sooner if necessary.  Disclaimer: This  note was dictated with voice recognition software. Similar sounding words can inadvertently be transcribed and may not be corrected upon review.       

## 2020-02-16 NOTE — Patient Instructions (Signed)
[facility] Discharge Instructions for Patients Receiving Chemotherapy  Today you received the following chemotherapy agents:  Keytruda.  To help prevent nausea and vomiting after your treatment, we encourage you to take your nausea medication as directed.   If you develop nausea and vomiting that is not controlled by your nausea medication, call the clinic.   BELOW ARE SYMPTOMS THAT SHOULD BE REPORTED IMMEDIATELY:  *FEVER GREATER THAN 100.5 F  *CHILLS WITH OR WITHOUT FEVER  NAUSEA AND VOMITING THAT IS NOT CONTROLLED WITH YOUR NAUSEA MEDICATION  *UNUSUAL SHORTNESS OF BREATH  *UNUSUAL BRUISING OR BLEEDING  TENDERNESS IN MOUTH AND THROAT WITH OR WITHOUT PRESENCE OF ULCERS  *URINARY PROBLEMS  *BOWEL PROBLEMS  UNUSUAL RASH Items with * indicate a potential emergency and should be followed up as soon as possible.  Feel free to call the clinic should you have any questions or concerns. The clinic phone number is [phone].  Please show the CHEMO ALERT CARD at check-in to the Emergency Department and triage nurse.    

## 2020-02-16 NOTE — Progress Notes (Signed)
Weight loss was identified on the malnutrition risk screen. Nutrition follow-up completed with patient during infusion with Keytruda for recurrent non-small cell lung cancer. Weight decreased and documented as 118.6 pounds August 3 down from 127.1 pounds on June 23.  This is a 7% weight loss over 5 weeks which is significant. Patient reports that her food just tastes bad. Her daughter cooks for her but does not cook the way she cooks.  She prefers that her food is salted during cooking. Reports she eats cereal with whole milk for breakfast. She tolerates mashed potatoes and green beans. She is not drinking nutrition shakes but does enjoy milk. Noted labs: Glucose 127 and albumin 3.0.  Nutrition diagnosis: Inadequate oral intake continues.  Intervention: Educated patient on strategies for increasing oral intake and small frequent meals and snacks. Encouraged high-calorie snacks and recommended milk with meals. Encouraged strategies for improving taste alterations. Encouraged her to discuss possible snacks with her daughter so they are available to her. Provided fact sheets. Questions were answered. Teach back method used. Patient has contact information.  Monitoring, evaluation, goals: Patient will tolerate increased calories and protein to minimize further weight loss.  Next visit: To be scheduled with upcoming treatment.  **Disclaimer: This note was dictated with voice recognition software. Similar sounding words can inadvertently be transcribed and this note may contain transcription errors which may not have been corrected upon publication of note.**

## 2020-02-16 NOTE — Patient Instructions (Signed)
Steps to Quit Smoking Smoking tobacco is the leading cause of preventable death. It can affect almost every organ in the body. Smoking puts you and people around you at risk for many serious, long-lasting (chronic) diseases. Quitting smoking can be hard, but it is one of the best things that you can do for your health. It is never too late to quit. How do I get ready to quit? When you decide to quit smoking, make a plan to help you succeed. Before you quit:  Pick a date to quit. Set a date within the next 2 weeks to give you time to prepare.  Write down the reasons why you are quitting. Keep this list in places where you will see it often.  Tell your family, friends, and co-workers that you are quitting. Their support is important.  Talk with your doctor about the choices that may help you quit.  Find out if your health insurance will pay for these treatments.  Know the people, places, things, and activities that make you want to smoke (triggers). Avoid them. What first steps can I take to quit smoking?  Throw away all cigarettes at home, at work, and in your car.  Throw away the things that you use when you smoke, such as ashtrays and lighters.  Clean your car. Make sure to empty the ashtray.  Clean your home, including curtains and carpets. What can I do to help me quit smoking? Talk with your doctor about taking medicines and seeing a counselor at the same time. You are more likely to succeed when you do both.  If you are pregnant or breastfeeding, talk with your doctor about counseling or other ways to quit smoking. Do not take medicine to help you quit smoking unless your doctor tells you to do so. To quit smoking: Quit right away  Quit smoking totally, instead of slowly cutting back on how much you smoke over a period of time.  Go to counseling. You are more likely to quit if you go to counseling sessions regularly. Take medicine You may take medicines to help you quit. Some  medicines need a prescription, and some you can buy over-the-counter. Some medicines may contain a drug called nicotine to replace the nicotine in cigarettes. Medicines may:  Help you to stop having the desire to smoke (cravings).  Help to stop the problems that come when you stop smoking (withdrawal symptoms). Your doctor may ask you to use:  Nicotine patches, gum, or lozenges.  Nicotine inhalers or sprays.  Non-nicotine medicine that is taken by mouth. Find resources Find resources and other ways to help you quit smoking and remain smoke-free after you quit. These resources are most helpful when you use them often. They include:  Online chats with a counselor.  Phone quitlines.  Printed self-help materials.  Support groups or group counseling.  Text messaging programs.  Mobile phone apps. Use apps on your mobile phone or tablet that can help you stick to your quit plan. There are many free apps for mobile phones and tablets as well as websites. Examples include Quit Guide from the CDC and smokefree.gov  What things can I do to make it easier to quit?   Talk to your family and friends. Ask them to support and encourage you.  Call a phone quitline (1-800-QUIT-NOW), reach out to support groups, or work with a counselor.  Ask people who smoke to not smoke around you.  Avoid places that make you want to smoke,   such as: ? Bars. ? Parties. ? Smoke-break areas at work.  Spend time with people who do not smoke.  Lower the stress in your life. Stress can make you want to smoke. Try these things to help your stress: ? Getting regular exercise. ? Doing deep-breathing exercises. ? Doing yoga. ? Meditating. ? Doing a body scan. To do this, close your eyes, focus on one area of your body at a time from head to toe. Notice which parts of your body are tense. Try to relax the muscles in those areas. How will I feel when I quit smoking? Day 1 to 3 weeks Within the first 24 hours,  you may start to have some problems that come from quitting tobacco. These problems are very bad 2-3 days after you quit, but they do not often last for more than 2-3 weeks. You may get these symptoms:  Mood swings.  Feeling restless, nervous, angry, or annoyed.  Trouble concentrating.  Dizziness.  Strong desire for high-sugar foods and nicotine.  Weight gain.  Trouble pooping (constipation).  Feeling like you may vomit (nausea).  Coughing or a sore throat.  Changes in how the medicines that you take for other issues work in your body.  Depression.  Trouble sleeping (insomnia). Week 3 and afterward After the first 2-3 weeks of quitting, you may start to notice more positive results, such as:  Better sense of smell and taste.  Less coughing and sore throat.  Slower heart rate.  Lower blood pressure.  Clearer skin.  Better breathing.  Fewer sick days. Quitting smoking can be hard. Do not give up if you fail the first time. Some people need to try a few times before they succeed. Do your best to stick to your quit plan, and talk with your doctor if you have any questions or concerns. Summary  Smoking tobacco is the leading cause of preventable death. Quitting smoking can be hard, but it is one of the best things that you can do for your health.  When you decide to quit smoking, make a plan to help you succeed.  Quit smoking right away, not slowly over a period of time.  When you start quitting, seek help from your doctor, family, or friends. This information is not intended to replace advice given to you by your health care provider. Make sure you discuss any questions you have with your health care provider. Document Revised: 03/27/2019 Document Reviewed: 09/20/2018 Elsevier Patient Education  2020 [employer]vier Inc.  

## 2020-02-22 ENCOUNTER — Other Ambulatory Visit: Payer: Self-pay | Admitting: Medical Oncology

## 2020-02-22 DIAGNOSIS — C3491 Malignant neoplasm of unspecified part of right bronchus or lung: Secondary | ICD-10-CM

## 2020-02-22 DIAGNOSIS — Z5112 Encounter for antineoplastic immunotherapy: Secondary | ICD-10-CM

## 2020-02-23 ENCOUNTER — Inpatient Hospital Stay: Payer: Medicare Other

## 2020-02-23 ENCOUNTER — Other Ambulatory Visit: Payer: Self-pay

## 2020-02-23 DIAGNOSIS — Z87891 Personal history of nicotine dependence: Secondary | ICD-10-CM | POA: Diagnosis not present

## 2020-02-23 DIAGNOSIS — Z79899 Other long term (current) drug therapy: Secondary | ICD-10-CM | POA: Diagnosis not present

## 2020-02-23 DIAGNOSIS — K219 Gastro-esophageal reflux disease without esophagitis: Secondary | ICD-10-CM | POA: Diagnosis not present

## 2020-02-23 DIAGNOSIS — Z7982 Long term (current) use of aspirin: Secondary | ICD-10-CM | POA: Diagnosis not present

## 2020-02-23 DIAGNOSIS — E119 Type 2 diabetes mellitus without complications: Secondary | ICD-10-CM | POA: Diagnosis not present

## 2020-02-23 DIAGNOSIS — Z7984 Long term (current) use of oral hypoglycemic drugs: Secondary | ICD-10-CM | POA: Diagnosis not present

## 2020-02-23 DIAGNOSIS — I10 Essential (primary) hypertension: Secondary | ICD-10-CM | POA: Diagnosis not present

## 2020-02-23 DIAGNOSIS — Z5112 Encounter for antineoplastic immunotherapy: Secondary | ICD-10-CM

## 2020-02-23 DIAGNOSIS — C3491 Malignant neoplasm of unspecified part of right bronchus or lung: Secondary | ICD-10-CM

## 2020-02-23 DIAGNOSIS — Z8673 Personal history of transient ischemic attack (TIA), and cerebral infarction without residual deficits: Secondary | ICD-10-CM | POA: Diagnosis not present

## 2020-02-23 DIAGNOSIS — Z9049 Acquired absence of other specified parts of digestive tract: Secondary | ICD-10-CM | POA: Diagnosis not present

## 2020-02-23 DIAGNOSIS — Z9221 Personal history of antineoplastic chemotherapy: Secondary | ICD-10-CM | POA: Diagnosis not present

## 2020-02-23 DIAGNOSIS — C3411 Malignant neoplasm of upper lobe, right bronchus or lung: Secondary | ICD-10-CM | POA: Diagnosis not present

## 2020-02-23 DIAGNOSIS — E785 Hyperlipidemia, unspecified: Secondary | ICD-10-CM | POA: Diagnosis not present

## 2020-02-23 DIAGNOSIS — Z923 Personal history of irradiation: Secondary | ICD-10-CM | POA: Diagnosis not present

## 2020-02-23 LAB — CMP (CANCER CENTER ONLY)
ALT: <6 U/L (ref 0–44)
AST: 9 U/L — ABNORMAL LOW (ref 15–41)
Albumin: 2.7 g/dL — ABNORMAL LOW (ref 3.5–5.0)
Alkaline Phosphatase: 83 U/L (ref 38–126)
Anion gap: 9 (ref 5–15)
BUN: 15 mg/dL (ref 8–23)
CO2: 27 mmol/L (ref 22–32)
Calcium: 10.5 mg/dL — ABNORMAL HIGH (ref 8.9–10.3)
Chloride: 101 mmol/L (ref 98–111)
Creatinine: 0.77 mg/dL (ref 0.44–1.00)
GFR, Est AFR Am: >60 mL/min (ref >60)
GFR, Estimated: >60 mL/min (ref >60)
Glucose, Bld: 123 mg/dL — ABNORMAL HIGH (ref 70–99)
Potassium: 4.2 mmol/L (ref 3.5–5.1)
Sodium: 137 mmol/L (ref 135–145)
Total Bilirubin: 0.3 mg/dL (ref 0.3–1.2)
Total Protein: 6.8 g/dL (ref 6.5–8.1)

## 2020-02-23 LAB — CBC WITH DIFFERENTIAL (CANCER CENTER ONLY)
Abs Immature Granulocytes: 0.06 K/uL (ref 0.00–0.07)
Basophils Absolute: 0 K/uL (ref 0.0–0.1)
Basophils Relative: 0 %
Eosinophils Absolute: 0.1 K/uL (ref 0.0–0.5)
Eosinophils Relative: 1 %
HCT: 28.7 % — ABNORMAL LOW (ref 36.0–46.0)
Hemoglobin: 9.1 g/dL — ABNORMAL LOW (ref 12.0–15.0)
Immature Granulocytes: 1 %
Lymphocytes Relative: 8 %
Lymphs Abs: 1 K/uL (ref 0.7–4.0)
MCH: 29 pg (ref 26.0–34.0)
MCHC: 31.7 g/dL (ref 30.0–36.0)
MCV: 91.4 fL (ref 80.0–100.0)
Monocytes Absolute: 0.8 K/uL (ref 0.1–1.0)
Monocytes Relative: 6 %
Neutro Abs: 10.9 K/uL — ABNORMAL HIGH (ref 1.7–7.7)
Neutrophils Relative %: 84 %
Platelet Count: 540 K/uL — ABNORMAL HIGH (ref 150–400)
RBC: 3.14 MIL/uL — ABNORMAL LOW (ref 3.87–5.11)
RDW: 19.9 % — ABNORMAL HIGH (ref 11.5–15.5)
WBC Count: 12.9 K/uL — ABNORMAL HIGH (ref 4.0–10.5)
nRBC: 0 % (ref 0.0–0.2)

## 2020-02-23 LAB — TSH: TSH: 1.706 u[IU]/mL (ref 0.308–3.960)

## 2020-03-01 ENCOUNTER — Other Ambulatory Visit: Payer: Self-pay

## 2020-03-01 ENCOUNTER — Inpatient Hospital Stay: Payer: Medicare Other

## 2020-03-01 ENCOUNTER — Other Ambulatory Visit: Payer: Self-pay | Admitting: *Deleted

## 2020-03-01 DIAGNOSIS — I10 Essential (primary) hypertension: Secondary | ICD-10-CM | POA: Diagnosis not present

## 2020-03-01 DIAGNOSIS — Z923 Personal history of irradiation: Secondary | ICD-10-CM | POA: Diagnosis not present

## 2020-03-01 DIAGNOSIS — Z5112 Encounter for antineoplastic immunotherapy: Secondary | ICD-10-CM | POA: Diagnosis not present

## 2020-03-01 DIAGNOSIS — E785 Hyperlipidemia, unspecified: Secondary | ICD-10-CM | POA: Diagnosis not present

## 2020-03-01 DIAGNOSIS — Z9049 Acquired absence of other specified parts of digestive tract: Secondary | ICD-10-CM | POA: Diagnosis not present

## 2020-03-01 DIAGNOSIS — K219 Gastro-esophageal reflux disease without esophagitis: Secondary | ICD-10-CM | POA: Diagnosis not present

## 2020-03-01 DIAGNOSIS — C3491 Malignant neoplasm of unspecified part of right bronchus or lung: Secondary | ICD-10-CM

## 2020-03-01 DIAGNOSIS — C3411 Malignant neoplasm of upper lobe, right bronchus or lung: Secondary | ICD-10-CM | POA: Diagnosis not present

## 2020-03-01 DIAGNOSIS — Z9221 Personal history of antineoplastic chemotherapy: Secondary | ICD-10-CM | POA: Diagnosis not present

## 2020-03-01 DIAGNOSIS — Z87891 Personal history of nicotine dependence: Secondary | ICD-10-CM | POA: Diagnosis not present

## 2020-03-01 DIAGNOSIS — Z79899 Other long term (current) drug therapy: Secondary | ICD-10-CM | POA: Diagnosis not present

## 2020-03-01 DIAGNOSIS — Z8673 Personal history of transient ischemic attack (TIA), and cerebral infarction without residual deficits: Secondary | ICD-10-CM | POA: Diagnosis not present

## 2020-03-01 DIAGNOSIS — E119 Type 2 diabetes mellitus without complications: Secondary | ICD-10-CM | POA: Diagnosis not present

## 2020-03-01 DIAGNOSIS — Z7982 Long term (current) use of aspirin: Secondary | ICD-10-CM | POA: Diagnosis not present

## 2020-03-01 DIAGNOSIS — Z7984 Long term (current) use of oral hypoglycemic drugs: Secondary | ICD-10-CM | POA: Diagnosis not present

## 2020-03-01 LAB — TSH: TSH: 1.567 u[IU]/mL (ref 0.308–3.960)

## 2020-03-08 ENCOUNTER — Inpatient Hospital Stay: Payer: Medicare Other

## 2020-03-08 ENCOUNTER — Other Ambulatory Visit: Payer: Self-pay

## 2020-03-08 ENCOUNTER — Inpatient Hospital Stay: Payer: Medicare Other | Admitting: Internal Medicine

## 2020-03-08 ENCOUNTER — Encounter: Payer: Self-pay | Admitting: Internal Medicine

## 2020-03-08 VITALS — BP 135/58 | HR 85 | Temp 97.8°F | Resp 20 | Ht 66.0 in | Wt 117.3 lb

## 2020-03-08 DIAGNOSIS — Z5112 Encounter for antineoplastic immunotherapy: Secondary | ICD-10-CM

## 2020-03-08 DIAGNOSIS — K219 Gastro-esophageal reflux disease without esophagitis: Secondary | ICD-10-CM | POA: Diagnosis not present

## 2020-03-08 DIAGNOSIS — E119 Type 2 diabetes mellitus without complications: Secondary | ICD-10-CM | POA: Diagnosis not present

## 2020-03-08 DIAGNOSIS — C3491 Malignant neoplasm of unspecified part of right bronchus or lung: Secondary | ICD-10-CM

## 2020-03-08 DIAGNOSIS — Z95828 Presence of other vascular implants and grafts: Secondary | ICD-10-CM

## 2020-03-08 DIAGNOSIS — Z79899 Other long term (current) drug therapy: Secondary | ICD-10-CM | POA: Diagnosis not present

## 2020-03-08 DIAGNOSIS — E785 Hyperlipidemia, unspecified: Secondary | ICD-10-CM | POA: Diagnosis not present

## 2020-03-08 DIAGNOSIS — Z87891 Personal history of nicotine dependence: Secondary | ICD-10-CM | POA: Diagnosis not present

## 2020-03-08 DIAGNOSIS — Z923 Personal history of irradiation: Secondary | ICD-10-CM | POA: Diagnosis not present

## 2020-03-08 DIAGNOSIS — Z7984 Long term (current) use of oral hypoglycemic drugs: Secondary | ICD-10-CM | POA: Diagnosis not present

## 2020-03-08 DIAGNOSIS — Z9221 Personal history of antineoplastic chemotherapy: Secondary | ICD-10-CM | POA: Diagnosis not present

## 2020-03-08 DIAGNOSIS — I10 Essential (primary) hypertension: Secondary | ICD-10-CM | POA: Diagnosis not present

## 2020-03-08 DIAGNOSIS — Z7982 Long term (current) use of aspirin: Secondary | ICD-10-CM | POA: Diagnosis not present

## 2020-03-08 DIAGNOSIS — C3411 Malignant neoplasm of upper lobe, right bronchus or lung: Secondary | ICD-10-CM | POA: Diagnosis not present

## 2020-03-08 DIAGNOSIS — Z9049 Acquired absence of other specified parts of digestive tract: Secondary | ICD-10-CM | POA: Diagnosis not present

## 2020-03-08 DIAGNOSIS — Z8673 Personal history of transient ischemic attack (TIA), and cerebral infarction without residual deficits: Secondary | ICD-10-CM | POA: Diagnosis not present

## 2020-03-08 LAB — CMP (CANCER CENTER ONLY)
ALT: <6 U/L (ref 0–44)
AST: 9 U/L — ABNORMAL LOW (ref 15–41)
Albumin: 2.7 g/dL — ABNORMAL LOW (ref 3.5–5.0)
Alkaline Phosphatase: 70 U/L (ref 38–126)
Anion gap: 9 (ref 5–15)
BUN: 20 mg/dL (ref 8–23)
CO2: 27 mmol/L (ref 22–32)
Calcium: 10.6 mg/dL — ABNORMAL HIGH (ref 8.9–10.3)
Chloride: 101 mmol/L (ref 98–111)
Creatinine: 0.83 mg/dL (ref 0.44–1.00)
GFR, Est AFR Am: >60 mL/min (ref >60)
GFR, Estimated: >60 mL/min (ref >60)
Glucose, Bld: 136 mg/dL — ABNORMAL HIGH (ref 70–99)
Potassium: 4.3 mmol/L (ref 3.5–5.1)
Sodium: 137 mmol/L (ref 135–145)
Total Bilirubin: 0.3 mg/dL (ref 0.3–1.2)
Total Protein: 6.8 g/dL (ref 6.5–8.1)

## 2020-03-08 LAB — TSH: TSH: 2.108 u[IU]/mL (ref 0.308–3.960)

## 2020-03-08 LAB — CBC WITH DIFFERENTIAL (CANCER CENTER ONLY)
Abs Immature Granulocytes: 0.04 10*3/uL (ref 0.00–0.07)
Basophils Absolute: 0 10*3/uL (ref 0.0–0.1)
Basophils Relative: 0 %
Eosinophils Absolute: 0.2 10*3/uL (ref 0.0–0.5)
Eosinophils Relative: 2 %
HCT: 28.5 % — ABNORMAL LOW (ref 36.0–46.0)
Hemoglobin: 9.2 g/dL — ABNORMAL LOW (ref 12.0–15.0)
Immature Granulocytes: 0 %
Lymphocytes Relative: 9 %
Lymphs Abs: 1 10*3/uL (ref 0.7–4.0)
MCH: 29.7 pg (ref 26.0–34.0)
MCHC: 32.3 g/dL (ref 30.0–36.0)
MCV: 91.9 fL (ref 80.0–100.0)
Monocytes Absolute: 0.8 10*3/uL (ref 0.1–1.0)
Monocytes Relative: 6 %
Neutro Abs: 9.9 10*3/uL — ABNORMAL HIGH (ref 1.7–7.7)
Neutrophils Relative %: 83 %
Platelet Count: 491 10*3/uL — ABNORMAL HIGH (ref 150–400)
RBC: 3.1 MIL/uL — ABNORMAL LOW (ref 3.87–5.11)
RDW: 17.9 % — ABNORMAL HIGH (ref 11.5–15.5)
WBC Count: 12 10*3/uL — ABNORMAL HIGH (ref 4.0–10.5)
nRBC: 0 % (ref 0.0–0.2)

## 2020-03-08 MED ORDER — HEPARIN SOD (PORK) LOCK FLUSH 100 UNIT/ML IV SOLN
500.0000 [IU] | Freq: Once | INTRAVENOUS | Status: AC | PRN
  Administered 2020-03-08: 500 [IU]
  Filled 2020-03-08: qty 5

## 2020-03-08 MED ORDER — ALTEPLASE 2 MG IJ SOLR
2.0000 mg | Freq: Once | INTRAMUSCULAR | Status: AC | PRN
  Administered 2020-03-08: 2 mg
  Filled 2020-03-08: qty 2

## 2020-03-08 MED ORDER — SODIUM CHLORIDE 0.9% FLUSH
10.0000 mL | INTRAVENOUS | Status: DC | PRN
  Administered 2020-03-08: 10 mL
  Filled 2020-03-08: qty 10

## 2020-03-08 MED ORDER — ALTEPLASE 2 MG IJ SOLR
2.0000 mg | Freq: Once | INTRAMUSCULAR | Status: DC | PRN
  Filled 2020-03-08: qty 2

## 2020-03-08 MED ORDER — ALTEPLASE 2 MG IJ SOLR
INTRAMUSCULAR | Status: AC
  Filled 2020-03-08: qty 2

## 2020-03-08 MED ORDER — SODIUM CHLORIDE 0.9 % IV SOLN
Freq: Once | INTRAVENOUS | Status: AC
  Filled 2020-03-08: qty 250

## 2020-03-08 MED ORDER — SODIUM CHLORIDE 0.9 % IV SOLN
200.0000 mg | Freq: Once | INTRAVENOUS | Status: AC
  Administered 2020-03-08: 200 mg via INTRAVENOUS
  Filled 2020-03-08: qty 8

## 2020-03-08 NOTE — Progress Notes (Signed)
[place] Telephone:([phone]) [phone]   Fax:(336) [phone]  OFFICE PROGRESS NOTE  [name], MD [place] [address]  DIAGNOSIS:Recurrent non-small cell lung cancer likely squamous cell carcinoma that was initially diagnosed as a stage IIA(T2b, N0, M0) inAugust 2018status post curative stereotactic radiotherapy completed June 21, 2017. The patient presented today with concerning findings for disease recurrence and progression with enlarging and hypermetabolic right upper lobe lung mass presenting as stage IIIa (T3, N0, M0).  PDL 1 expression 0%.  PRIOR THERAPY: 1) Curative stereotactic radiotherapy completed on June 21, 2017. 2) A course of concurrent chemoradiation with chemotherapy consisting of carboplatin for an AUC of 2 and paclitaxel 45 mg/m.  First dose started on 06/23/2018.  Status post 7 cycles.  Last dose of chemotherapy was given August 04, 2018. 3) Consolidation immunotherapy with Imfinzi 10 mg/KG every 2 weeks.  First dose September 09, 2018.  Status post 26 cycles.   CURRENT THERAPY: Systemic chemotherapy with carboplatin for AUC of 5, paclitaxel 175 NG/M2 and Keytruda 200 mg IV every 3 weeks.  First dose Nov 25, 2019.  Status post 5 cycles.  INTERVAL HISTORY: [name] 79 y.o. female returns to the clinic today for follow-up visit.  The patient is feeling fine today with no concerning complaints except for fatigue and shortness of breath with exertion.  She denied having any current chest pain, cough or hemoptysis.  She denied having any fever or chills.  She has no nausea, vomiting, diarrhea or constipation.  She has no significant weight loss or night sweats.  She is here today for evaluation before starting cycle #5 of her treatment.  MEDICAL HISTORY: Past Medical History:  Diagnosis Date  . Carotid artery occlusion   . Clostridium difficile infection 06/2013  . Diabetes mellitus    takes Janumet  daily  . Dyslipidemia    takes Crestor daily  . Gallstones   . GERD (gastroesophageal reflux disease)   . Heart murmur    hx of  . History of radiation therapy 06/11/17-06/21/17   right lung 50 Gy in 5 fractions  . Hypertension    takes Prinzide and Verapamil daily  . Impaired speech    from stroke  . Neuropathy, diabetic ([place])   . nscl ca dx'd 2018   Lung   . Pneumonia   . PONV (postoperative nausea and vomiting)   . Seizures ([place]) 04/19/2018   recent hospitalization  . Smoker   . Stroke ([place]) 06/25/13  . Vertigo    but doesn't take any meds    ALLERGIES:  is allergic to codeine, benadryl [diphenhydramine], lipitor [atorvastatin], and phenergan [promethazine hcl].  MEDICATIONS:  Current Outpatient Medications  Medication Sig Dispense Refill  . aspirin EC 81 MG tablet Take 81 mg by mouth daily.    . Blood Glucose Monitoring Suppl (ONE TOUCH ULTRA 2) w/Device KIT 1 Device by Does not apply route 2 (two) times daily. 1 each 0  . cholecalciferol (VITAMIN D3) 25 MCG (1000 UNIT) tablet Take 1,000 Units by mouth daily.    . divalproex (DEPAKOTE ER) 500 MG 24 hr tablet Take one tablet  twice daily 60 tablet 6  . glucose blood (ONE TOUCH ULTRA TEST) test strip Use as instructed 100 each 12  . JANUMET XR 50-1000 MG TB24 TAKE 1 TABLET BY MOUTH DAILY 60 tablet 5  . Lancets (ONETOUCH ULTRASOFT) lancets Use as instructed 100 each 12  . lidocaine-prilocaine (EMLA) cream APPLY TO  THE AFFECTED AREA DAILY AS NEEDED (Patient taking differently: Apply 1 application topically daily as needed (Pain). ) 30 g 0  . lisinopril (ZESTRIL) 10 MG tablet TAKE 1 TABLET(10 MG) BY MOUTH DAILY 90 tablet 3  . Multiple Vitamin (MULTIVITAMIN) tablet Take 1 tablet by mouth daily.    . pioglitazone (ACTOS) 30 MG tablet TAKE 1 TABLET(30 MG) BY MOUTH DAILY 90 tablet 1  . prochlorperazine (COMPAZINE) 10 MG tablet Take 1 tablet (10 mg total) by mouth every 6 (six) hours as needed for nausea or vomiting. (Patient not  taking: Reported on 08/25/2019) 30 tablet 0  . rosuvastatin (CRESTOR) 20 MG tablet TAKE 1 TABLET BY MOUTH ONCE DAILY 180 tablet 0  . vitamin B-12 (CYANOCOBALAMIN) 1000 MCG tablet Take 1,000 mcg by mouth daily.     Current Facility-Administered Medications  Medication Dose Route Frequency Provider Last Rate Last Admin  . cyanocobalamin ((VITAMIN B-12)) injection 1,000 mcg  1,000 mcg Intramuscular Once [name], MD       Facility-Administered Medications Ordered in Other Visits  Medication Dose Route Frequency Provider Last Rate Last Admin  . heparin lock flush 100 unit/mL  500 Units Intracatheter Once PRN Curt Bears, MD      . sodium chloride flush (NS) 0.9 % injection 10 mL  10 mL Intracatheter PRN Curt Bears, MD        SURGICAL HISTORY:  Past Surgical History:  Procedure Laterality Date  . cataract removed Right   . CHOLECYSTECTOMY    . COLONOSCOPY    . ENDARTERECTOMY Left 07/14/2013   Procedure: ENDARTERECTOMY CAROTID-LEFT;  Surgeon: Elam Dutch, MD;  Location: [facility];  Service: Vascular;  Laterality: Left;  . EYE SURGERY Left    cataract  . IR CV LINE INJECTION  05/13/2019  . IR IMAGING GUIDED PORT INSERTION  09/15/2018  . IR PATIENT EVAL TECH 0-60 MINS  05/08/2019  . IR PERC PLEURAL DRAIN W/INDWELL CATH W/IMG GUIDE  05/23/2017  . KNEE SURGERY Left 22yr ago  . LAPAROSCOPIC PARTIAL COLECTOMY N/A 09/02/2015   Procedure: LAPAROSCOPIC PARTIAL RIGHT COLECTOMY;  Surgeon: ALeighton Ruff MD;  Location: [facility];  Service: General;  Laterality: N/A;  . PATCH ANGIOPLASTY Left 07/14/2013   Procedure: LEFT CAROTID ARTERY PATCH ANGIOPLASTY;  Surgeon: CElam Dutch MD;  [facility]  Service: Vascular;  Laterality: Left;  .Marland KitchenVIDEO BRONCHOSCOPY N/A 06/04/2018   Procedure: VIDEO BRONCHOSCOPY;  Surgeon: HMelrose Nakayama MD;  [facility];  Service: Thoracic;  Laterality: N/A;  . wisdom      REVIEW OF SYSTEMS:  A comprehensive review of systems was negative  except for: Constitutional: positive for fatigue Respiratory: positive for dyspnea on exertion   PHYSICAL EXAMINATION: General appearance: alert, cooperative, fatigued and no distress Head: Normocephalic, without obvious abnormality, atraumatic Neck: no adenopathy, no JVD, supple, symmetrical, trachea midline and thyroid not enlarged, symmetric, no tenderness/mass/nodules Lymph nodes: Cervical, supraclavicular, and axillary nodes normal. Resp: clear to auscultation bilaterally Back: symmetric, no curvature. ROM normal. No CVA tenderness. Cardio: regular rate and rhythm, S1, S2 normal, no murmur, click, rub or gallop GI: soft, non-tender; bowel sounds normal; no masses,  no organomegaly Extremities: extremities normal, atraumatic, no cyanosis or edema  ECOG PERFORMANCE STATUS: 1 - Symptomatic but completely ambulatory  Blood pressure (!) 135/58, pulse 85, temperature 97.8 F (36.6 C), temperature source Tympanic, resp. rate 20, height '5\' 6"'  (1.676 m), weight 117 lb 4.8 oz (53.2 kg), SpO2 100 %.  LABORATORY DATA: Lab Results  Component Value Date   WBC 12.0 (H) 03/08/2020   HGB 9.2 (L) 03/08/2020   HCT 28.5 (L) 03/08/2020   MCV 91.9 03/08/2020   PLT 491 (H) 03/08/2020      Chemistry      Component Value Date/Time   NA 137 02/23/2020 1034   NA 139 05/07/2018 1147   K 4.2 02/23/2020 1034   CL 101 02/23/2020 1034   CO2 27 02/23/2020 1034   BUN 15 02/23/2020 1034   BUN 15 05/07/2018 1147   CREATININE 0.77 02/23/2020 1034   CREATININE 0.87 03/08/2015 0001      Component Value Date/Time   CALCIUM 10.5 (H) 02/23/2020 1034   ALKPHOS 83 02/23/2020 1034   AST 9 (L) 02/23/2020 1034   ALT <6 02/23/2020 1034   BILITOT 0.3 02/23/2020 1034       RADIOGRAPHIC STUDIES: No results found.  ASSESSMENT AND PLAN: This is a very pleasant 79 years old white female with recurrent non-small cell lung cancer presented as a stage IIIa involving the right lower lobe. The patient underwent a  course of concurrent chemoradiation with weekly carboplatin and paclitaxel status post 7 cycles.  She tolerated this treatment well. The patient completed a course of consolidation immunotherapy with Imfinzi status post 26 cycles.  She tolerated her treatment well with no concerning complaints. The patient was on observation for several months. Imaging studies including repeat CT scan of the chest as well as a PET scan that showed evidence for disease recurrence in the right lung and metastasis to the left lung. The patient is started systemic chemotherapy with carboplatin for AUC of 5, paclitaxel 175 mg/M2 and Keytruda 200 mg IV every 3 weeks status post 5 cycles.  Starting from cycle #5 she is on maintenance treatment with single agent Keytruda. The patient continues to tolerate her treatment fairly well with no concerning complaints. I recommended for her to proceed with cycle #6 today as planned. I will see her back for follow-up visit in 3 weeks for evaluation before starting cycle #7. She was advised to call immediately if she has any concerning symptoms in the interval. The patient voices understanding of current disease status and treatment options and is in agreement with the current care plan. All questions were answered. The patient knows to call the clinic with any problems, questions or concerns. We can certainly see the patient much sooner if necessary.  Disclaimer: This note was dictated with voice recognition software. Similar sounding words can inadvertently be transcribed and may not be corrected upon review.

## 2020-03-08 NOTE — Patient Instructions (Signed)
Steps to Quit Smoking Smoking tobacco is the leading cause of preventable death. It can affect almost every organ in the body. Smoking puts you and people around you at risk for many serious, long-lasting (chronic) diseases. Quitting smoking can be hard, but it is one of the best things that you can do for your health. It is never too late to quit. How do I get ready to quit? When you decide to quit smoking, make a plan to help you succeed. Before you quit:  Pick a date to quit. Set a date within the next 2 weeks to give you time to prepare.  Write down the reasons why you are quitting. Keep this list in places where you will see it often.  Tell your family, friends, and co-workers that you are quitting. Their support is important.  Talk with your doctor about the choices that may help you quit.  Find out if your health insurance will pay for these treatments.  Know the people, places, things, and activities that make you want to smoke (triggers). Avoid them. What first steps can I take to quit smoking?  Throw away all cigarettes at home, at work, and in your car.  Throw away the things that you use when you smoke, such as ashtrays and lighters.  Clean your car. Make sure to empty the ashtray.  Clean your home, including curtains and carpets. What can I do to help me quit smoking? Talk with your doctor about taking medicines and seeing a counselor at the same time. You are more likely to succeed when you do both.  If you are pregnant or breastfeeding, talk with your doctor about counseling or other ways to quit smoking. Do not take medicine to help you quit smoking unless your doctor tells you to do so. To quit smoking: Quit right away  Quit smoking totally, instead of slowly cutting back on how much you smoke over a period of time.  Go to counseling. You are more likely to quit if you go to counseling sessions regularly. Take medicine You may take medicines to help you quit. Some  medicines need a prescription, and some you can buy over-the-counter. Some medicines may contain a drug called nicotine to replace the nicotine in cigarettes. Medicines may:  Help you to stop having the desire to smoke (cravings).  Help to stop the problems that come when you stop smoking (withdrawal symptoms). Your doctor may ask you to use:  Nicotine patches, gum, or lozenges.  Nicotine inhalers or sprays.  Non-nicotine medicine that is taken by mouth. Find resources Find resources and other ways to help you quit smoking and remain smoke-free after you quit. These resources are most helpful when you use them often. They include:  Online chats with a counselor.  Phone quitlines.  Printed self-help materials.  Support groups or group counseling.  Text messaging programs.  Mobile phone apps. Use apps on your mobile phone or tablet that can help you stick to your quit plan. There are many free apps for mobile phones and tablets as well as websites. Examples include Quit Guide from the CDC and smokefree.gov  What things can I do to make it easier to quit?   Talk to your family and friends. Ask them to support and encourage you.  Call a phone quitline (1-800-QUIT-NOW), reach out to support groups, or work with a counselor.  Ask people who smoke to not smoke around you.  Avoid places that make you want to smoke,   such as: ? Bars. ? Parties. ? Smoke-break areas at work.  Spend time with people who do not smoke.  Lower the stress in your life. Stress can make you want to smoke. Try these things to help your stress: ? Getting regular exercise. ? Doing deep-breathing exercises. ? Doing yoga. ? Meditating. ? Doing a body scan. To do this, close your eyes, focus on one area of your body at a time from head to toe. Notice which parts of your body are tense. Try to relax the muscles in those areas. How will I feel when I quit smoking? Day 1 to 3 weeks Within the first 24 hours,  you may start to have some problems that come from quitting tobacco. These problems are very bad 2-3 days after you quit, but they do not often last for more than 2-3 weeks. You may get these symptoms:  Mood swings.  Feeling restless, nervous, angry, or annoyed.  Trouble concentrating.  Dizziness.  Strong desire for high-sugar foods and nicotine.  Weight gain.  Trouble pooping (constipation).  Feeling like you may vomit (nausea).  Coughing or a sore throat.  Changes in how the medicines that you take for other issues work in your body.  Depression.  Trouble sleeping (insomnia). Week 3 and afterward After the first 2-3 weeks of quitting, you may start to notice more positive results, such as:  Better sense of smell and taste.  Less coughing and sore throat.  Slower heart rate.  Lower blood pressure.  Clearer skin.  Better breathing.  Fewer sick days. Quitting smoking can be hard. Do not give up if you fail the first time. Some people need to try a few times before they succeed. Do your best to stick to your quit plan, and talk with your doctor if you have any questions or concerns. Summary  Smoking tobacco is the leading cause of preventable death. Quitting smoking can be hard, but it is one of the best things that you can do for your health.  When you decide to quit smoking, make a plan to help you succeed.  Quit smoking right away, not slowly over a period of time.  When you start quitting, seek help from your doctor, family, or friends. This information is not intended to replace advice given to you by your health care provider. Make sure you discuss any questions you have with your health care provider. Document Revised: 03/27/2019 Document Reviewed: 09/20/2018 Elsevier Patient Education  2020 [employer]vier Inc.  

## 2020-03-08 NOTE — Progress Notes (Signed)
Nutrition Follow-up:  Patient with recurrent non small cell lung cancer.  Patient receiving [place].    Met with patient during infusion this am.  Patient was eating saltines and peanut butter.  Reports that she usually drinks milk with her snacks.  Does not always drink milk with meals.  Likes yogurt, cottage cheese and peaches for snacks. Likes tomato sandwiches and sometimes eats cucumbers as a snack.  Denies nausea.      Medications: reviewed  Labs: reviewed  Weight decreased to 117 lb 4.8 oz today slight decrease from 118 lb on 8/3.   127 lb on 6/23  NUTRITION DIAGNOSIS: Inadequate oral intake continues   INTERVENTION:  Encouraged patient add milk with meals and snacks.  Discussed ways to add more calories and protein on tomato sandwich.  Discussed ways to add more calories and protein to cucumber.        MONITORING, EVALUATION, GOAL: weight trends, intake   NEXT VISIT: to be determined with treatment  Edmar Blankenburg B. Zenia Resides, [place], [place] Registered Dietitian [phone] (mobile)

## 2020-03-08 NOTE — Patient Instructions (Signed)
[facility] Discharge Instructions for Patients Receiving Chemotherapy  Today you received the following chemotherapy agents:  Keytruda.  To help prevent nausea and vomiting after your treatment, we encourage you to take your nausea medication as directed.   If you develop nausea and vomiting that is not controlled by your nausea medication, call the clinic.   BELOW ARE SYMPTOMS THAT SHOULD BE REPORTED IMMEDIATELY:  *FEVER GREATER THAN 100.5 F  *CHILLS WITH OR WITHOUT FEVER  NAUSEA AND VOMITING THAT IS NOT CONTROLLED WITH YOUR NAUSEA MEDICATION  *UNUSUAL SHORTNESS OF BREATH  *UNUSUAL BRUISING OR BLEEDING  TENDERNESS IN MOUTH AND THROAT WITH OR WITHOUT PRESENCE OF ULCERS  *URINARY PROBLEMS  *BOWEL PROBLEMS  UNUSUAL RASH Items with * indicate a potential emergency and should be followed up as soon as possible.  Feel free to call the clinic should you have any questions or concerns. The clinic phone number is [phone].  Please show the CHEMO ALERT CARD at check-in to the Emergency Department and triage nurse.    

## 2020-03-09 ENCOUNTER — Telehealth: Payer: Self-pay | Admitting: Internal Medicine

## 2020-03-09 ENCOUNTER — Telehealth: Payer: Self-pay | Admitting: Medical Oncology

## 2020-03-09 DIAGNOSIS — Z95828 Presence of other vascular implants and grafts: Secondary | ICD-10-CM

## 2020-03-09 NOTE — Telephone Encounter (Signed)
Port not aspirating consistently- several episodes ( approx every other appt or monthly ) port a cath will not aspirate. Alteplase instilled and after 30-60 30 mins dwell time ,the port will aspirate.Pt agrees to port dye study.

## 2020-03-09 NOTE — Telephone Encounter (Signed)
Scheduled per los. Called and spoke with patient. Confirmed appt 

## 2020-03-11 ENCOUNTER — Other Ambulatory Visit (INDEPENDENT_AMBULATORY_CARE_PROVIDER_SITE_OTHER): Payer: Medicare Other

## 2020-03-11 ENCOUNTER — Other Ambulatory Visit: Payer: Self-pay

## 2020-03-11 ENCOUNTER — Ambulatory Visit (HOSPITAL_COMMUNITY)
Admission: RE | Admit: 2020-03-11 | Discharge: 2020-03-11 | Disposition: A | Discharge status: Home / Self Care | Payer: Medicare Other | Source: Ambulatory Visit | Attending: Internal Medicine | Admitting: Internal Medicine

## 2020-03-11 DIAGNOSIS — T82828A Fibrosis of vascular prosthetic devices, implants and grafts, initial encounter: Secondary | ICD-10-CM | POA: Diagnosis not present

## 2020-03-11 DIAGNOSIS — E538 Deficiency of other specified B group vitamins: Secondary | ICD-10-CM | POA: Diagnosis not present

## 2020-03-11 DIAGNOSIS — Z452 Encounter for adjustment and management of vascular access device: Secondary | ICD-10-CM | POA: Insufficient documentation

## 2020-03-11 DIAGNOSIS — Z95828 Presence of other vascular implants and grafts: Secondary | ICD-10-CM

## 2020-03-11 HISTORY — PX: IR CV LINE INJECTION: IMG2294

## 2020-03-11 MED ORDER — HEPARIN SOD (PORK) LOCK FLUSH 100 UNIT/ML IV SOLN
INTRAVENOUS | Status: AC
  Filled 2020-03-11: qty 5

## 2020-03-11 MED ORDER — CYANOCOBALAMIN 1000 MCG/ML IJ SOLN
1000.0000 ug | Freq: Once | INTRAMUSCULAR | Status: AC
  Administered 2020-03-11: 1000 ug via INTRAMUSCULAR

## 2020-03-11 MED ORDER — HEPARIN SOD (PORK) LOCK FLUSH 100 UNIT/ML IV SOLN
INTRAVENOUS | Status: DC | PRN
  Administered 2020-03-11: 500 [IU] via INTRAVENOUS

## 2020-03-11 MED ORDER — IOHEXOL 300 MG/ML  SOLN
50.0000 mL | Freq: Once | INTRAMUSCULAR | Status: AC | PRN
  Administered 2020-03-11: 5 mL via INTRAVENOUS

## 2020-03-11 NOTE — Procedures (Signed)
Interventional Radiology Procedure Note  Procedure: RT PORT INJECTION    Complications: None  Estimated Blood Loss:  NONE  Findings: TIP PROX SVC  SMALL AMT OF TIP FIBRIN SHEATH /THROMBUS WITH OUT OCCLUSION    Tamera Punt, MD

## 2020-03-28 NOTE — Progress Notes (Signed)
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 The patient presented today with concerning findings for disease recurrence and progression with enlarging and hypermetabolic right upper lobe Walker mass presenting as stage IIIa (T3, N0, M0).  PDL 1 expression 0%.  PRIOR THERAPY: 1) Curative stereotactic radiotherapy completed on June 21, 2017. 2) A course of concurrent chemoradiation with chemotherapy consisting of carboplatin for an AUC of 2 and paclitaxel 45 mg/m.First dose started on 06/23/2018.Status post 7 cycles. Last dose of chemotherapy was given August 04, 2018. 3)SBRT to the enlarging left lower lobe pulmonary nodule under the care of Dr. Sondra Come. Last radiation treatment scheduled for 03/26/2019 4) Consolidation immunotherapy with Imfinzi 10 mg/KG every 2 weeks. Last dose given on 09/07/19. Status post26cycles.  CURRENT THERAPY: Systemic chemotherapy with carboplatin for an AUC of 5, Taxol 175 mg/m2, and Keytruda 200 mg IV every 3 weeks with Neulasta. Status post 6 cycles. First dose received on 11/25/19. Starting from cycle #5, she has been on maintenance immunotherapy with Keytruda.   INTERVAL HISTORY: [name] 79 y.o. female returns to the clinic for a follow up visit. The patient is feeling well today without any concerning complaints. The patient continues to tolerate treatment with immunotherapy with Keytruda well without any adverse effects. Denies any fever, chills, night sweats, or weight loss. Denies any chest pain, shortness of breath, or hemoptysis. She reports her mild baseline dry cough. She notes she sometimes feels unsteady on her feet but does not want to use  assistive devices such as a cane/walker. Denies any nausea, vomiting, diarrhea, or constipation. She occasionally has loose bowels 1-2 episodes depending on what she eats which is not unusual for her. Denies any headache or visual changes. Denies any rashes or skin changes. The patient is here today for evaluation prior to starting cycle # 7   MEDICAL HISTORY: Past Medical History:  Diagnosis Date  . Carotid artery occlusion   . Clostridium difficile infection 06/2013  . Diabetes mellitus    takes Janumet daily  . Dyslipidemia    takes Crestor daily  . Gallstones   . GERD (gastroesophageal reflux disease)   . Heart murmur    hx of  . History of radiation therapy 06/11/17-06/21/17   right Walker 50 Gy in 5 fractions  . Hypertension    takes Prinzide and Verapamil daily  . Impaired speech    from stroke  . Neuropathy, diabetic ([facility])   . nscl ca dx'd 2018   Walker   . Pneumonia   . PONV (postoperative nausea and vomiting)   . Seizures ([facility]) 04/19/2018   recent hospitalization  . Smoker   . Stroke ([facility]) 06/25/13  . Vertigo    but doesn't take any meds    ALLERGIES:  is allergic to codeine, benadryl [diphenhydramine], lipitor [atorvastatin], and phenergan [promethazine hcl].  MEDICATIONS:  Current Outpatient Medications  Medication Sig Dispense Refill  . aspirin EC 81 MG tablet Take 81 mg by mouth daily.    . Blood Glucose Monitoring Suppl (ONE TOUCH ULTRA 2) w/Device KIT 1 Device by Does not apply route 2 (two) times daily. 1 each 0  . Cholecalciferol (VITAMIN D) 50 MCG (2000 UT) tablet Take 2,000 Units by mouth daily.    . Cyanocobalamin (B-12  COMPLIANCE INJECTION IJ) Inject 1 Dose as directed every 30 (thirty) days.    . divalproex (DEPAKOTE ER) 500 MG 24 hr tablet Take one tablet  twice daily (Patient taking differently: Take 500 mg by mouth 2 (two) times daily. ) 60 tablet 6  . glucose blood (ONE TOUCH ULTRA TEST) test strip Use as instructed 100 each 12  . JANUMET XR 50-1000  MG TB24 TAKE 1 TABLET BY MOUTH DAILY (Patient taking differently: Take 1 tablet by mouth daily. ) 60 tablet 5  . Lancets (ONETOUCH ULTRASOFT) lancets Use as instructed 100 each 12  . lidocaine-prilocaine (EMLA) cream APPLY TO THE AFFECTED AREA DAILY AS NEEDED (Patient taking differently: Apply 1 application topically daily as needed (port access). ) 30 g 0  . lisinopril (ZESTRIL) 10 MG tablet TAKE 1 TABLET(10 MG) BY MOUTH DAILY (Patient taking differently: Take 10 mg by mouth daily. ) 90 tablet 3  . Multiple Vitamin (MULTIVITAMIN WITH MINERALS) TABS tablet Take 1 tablet by mouth daily.    . pioglitazone (ACTOS) 30 MG tablet TAKE 1 TABLET(30 MG) BY MOUTH DAILY (Patient taking differently: Take 30 mg by mouth daily. ) 90 tablet 1  . rosuvastatin (CRESTOR) 20 MG tablet TAKE 1 TABLET BY MOUTH ONCE DAILY (Patient taking differently: Take 20 mg by mouth daily. ) 180 tablet 0  . vitamin B-12 (CYANOCOBALAMIN) 100 MCG tablet Take 100 mcg by mouth daily.     Current Facility-Administered Medications  Medication Dose Route Frequency Provider Last Rate Last Admin  . cyanocobalamin ((VITAMIN B-12)) injection 1,000 mcg  1,000 mcg Intramuscular Once Nichole Lung, MD       Facility-Administered Medications Ordered in Other Visits  Medication Dose Route Frequency Provider Last Rate Last Admin  . heparin lock flush 100 unit/mL  500 Units Intracatheter Once PRN Curt Bears, MD      . sodium chloride flush (NS) 0.9 % injection 10 mL  10 mL Intracatheter PRN Curt Bears, MD        SURGICAL HISTORY:  Past Surgical History:  Procedure Laterality Date  . cataract removed Right   . CHOLECYSTECTOMY    . COLONOSCOPY    . ENDARTERECTOMY Left 07/14/2013   Procedure: ENDARTERECTOMY CAROTID-LEFT;  Surgeon: Elam Dutch, MD;  Location: [facility];  Service: Vascular;  Laterality: Left;  . EYE SURGERY Left    cataract  . IR CV LINE INJECTION  05/13/2019  . IR CV LINE INJECTION  03/11/2020  . IR IMAGING  GUIDED PORT INSERTION  09/15/2018  . IR PATIENT EVAL TECH 0-60 MINS  05/08/2019  . IR PERC PLEURAL DRAIN W/INDWELL CATH W/IMG GUIDE  05/23/2017  . KNEE SURGERY Left 79yr ago  . LAPAROSCOPIC PARTIAL COLECTOMY N/A 09/02/2015   Procedure: LAPAROSCOPIC PARTIAL RIGHT COLECTOMY;  Surgeon: ALeighton Ruff MD;  Location: [facility];  Service: General;  Laterality: N/A;  . PATCH ANGIOPLASTY Left 07/14/2013   Procedure: LEFT CAROTID ARTERY PATCH ANGIOPLASTY;  Surgeon: CElam Dutch MD;  [facility]  Service: Vascular;  Laterality: Left;  .Marland KitchenVIDEO BRONCHOSCOPY N/A 06/04/2018   Procedure: VIDEO BRONCHOSCOPY;  Surgeon: HMelrose Nakayama MD;  [facility];  Service: Thoracic;  Laterality: N/A;  . wisdom      REVIEW OF SYSTEMS:   Review of Systems  Constitutional: Negative for appetite change, chills, fatigue, fever and unexpected weight change.  HENT: Negative for mouth sores, nosebleeds, sore throat and trouble swallowing.   Eyes: Negative for eye problems and icterus.  Respiratory: Negative for  cough, hemoptysis, shortness of breath and wheezing.   Cardiovascular: Negative for chest pain and leg swelling.  Gastrointestinal: Negative for abdominal pain, constipation, diarrhea, nausea and vomiting.  Genitourinary: Negative for bladder incontinence, difficulty urinating, dysuria, frequency and hematuria.   Musculoskeletal: Negative for back pain, gait problem, neck pain and neck stiffness.  Skin: Negative for itching and rash.  Neurological: Negative for dizziness, extremity weakness, gait problem, headaches, light-headedness and seizures.  Hematological: Negative for adenopathy. Does not bruise/bleed easily.  Psychiatric/Behavioral: Negative for confusion, depression and sleep disturbance. The patient is not nervous/anxious.     PHYSICAL EXAMINATION:  Blood pressure (!) 141/62, pulse 81, temperature 98.6 F (37 C), temperature source Tympanic, resp. rate 20, height '5\' 6"'  (1.676 m), weight  119 lb 8 oz (54.2 kg), SpO2 100 %.  ECOG PERFORMANCE STATUS: 1 - Symptomatic but completely ambulatory  Physical Exam  Constitutional: Oriented to person, place, and time and well-developed, well-nourished, and in no distress.  HENT:  Head: Normocephalic and atraumatic.  Mouth/Throat: Oropharynx is clear and moist. No oropharyngeal exudate.  Eyes: Conjunctivae are normal. Right eye exhibits no discharge. Left eye exhibits no discharge. No scleral icterus.  Neck: Normal range of motion. Neck supple.  Cardiovascular: Normal rate, regular rhythm, systolic murmur noted in the right 2nd intercostal space and intact distal pulses.   Pulmonary/Chest: Effort normal and breath sounds normal. No respiratory distress. No wheezes. No rales.  Abdominal: Soft. Bowel sounds are normal. Exhibits no distension and no mass. There is no tenderness.  Musculoskeletal: Normal range of motion. Exhibits no edema.  Lymphadenopathy:    No cervical adenopathy.  Neurological: Alert and oriented to person, place, and time. Exhibits normal muscle tone. Gait normal. Coordination normal.  Skin: Skin is warm and dry. No rash noted. Not diaphoretic. No erythema. No pallor.  Psychiatric: Mood, memory and judgment normal.  Vitals reviewed.  LABORATORY DATA: Lab Results  Component Value Date   WBC 9.3 03/29/2020   HGB 9.2 (L) 03/29/2020   HCT 28.9 (L) 03/29/2020   MCV 91.2 03/29/2020   PLT 445 (H) 03/29/2020      Chemistry      Component Value Date/Time   NA 136 03/29/2020 1027   NA 139 05/07/2018 1147   K 3.6 03/29/2020 1027   CL 101 03/29/2020 1027   CO2 30 03/29/2020 1027   BUN 16 03/29/2020 1027   BUN 15 05/07/2018 1147   CREATININE 0.79 03/29/2020 1027   CREATININE 0.87 03/08/2015 0001      Component Value Date/Time   CALCIUM 10.0 03/29/2020 1027   ALKPHOS 77 03/29/2020 1027   AST 13 (L) 03/29/2020 1027   ALT <6 03/29/2020 1027   BILITOT 0.3 03/29/2020 1027       RADIOGRAPHIC STUDIES:  IR CV  Line Injection  Result Date: 03/11/2020 INDICATION: Walker cancer, poorly functioning right IJ power port catheter EXAM: Fluoroscopic port catheter injection MEDICATIONS: None. ANESTHESIA/SEDATION: Moderate Sedation Time: None. The patient's level of consciousness and vital signs were monitored continuously by radiology nursing throughout the procedure under my direct supervision. FLUOROSCOPY TIME:  Fluoroscopy Time: 0 minutes 6 seconds (4 mGy). COMPLICATIONS: None immediate. PROCEDURE: Informed written consent was obtained from the patient after a thorough discussion of the procedural risks, benefits and alternatives. All questions were addressed. Maximal Sterile Barrier Technique was utilized including caps, mask, sterile gowns, sterile gloves, sterile drape, hand hygiene and skin antiseptic. A timeout was performed prior to the initiation of the procedure. Under sterile conditions, the existing  right IJ power port catheter was accessed. Needle position confirmed with fluoroscopy in the port catheter. Blood would not aspirate. Saline flushed easily. Fluoroscopic injection performed with contrast. Subtraction imaging performed. Port catheter tip is in the proximal SVC. Small amount of catheter tip fibrin sheath/thrombus without catheter occlusion. SVC remains patent. IMPRESSION: Right IJ power port catheter tip proximal SVC. Small amount of catheter tip fibrin sheath/thrombus without occlusion If there is continued poor function, consider port catheter revision Electronically Signed   By: Jerilynn Mages.  Shick M.D.   On: 03/11/2020 15:41     ASSESSMENT/PLAN:  This is a very pleasant 79 year old Caucasian female with recurrentstage IIIAnon-small cell Walker cancer, squamous cell carcinomaof the right upper lobe. She presented initially as a stage IIa in August of 2018.  The patient underwent a course of concurrent chemoradiation with weekly carboplatin and paclitaxel. She status post 7 cycles. She tolerated treatment  well. She is currently undergoing consolidation immunotherapy with Imfinzi 10 mg/kg IV every 2 weeks. She is status post26cycles.  She tolerated it well without any adverse side effects.   She recently completed SBRT under the care of Dr. Sondra Come to the enlarging left pulmonary nodule on 03/26/2019.  She recently showed evidence of disease progression and started on treatment with carboplatin for an AUC of 5, paclitaxel 175 mg/m2 and Keytruda 200 mg IV every 3 weeks. She is status post 6 cycles. Starting from cycle #5, she has been on maintenance [place].   Labs were reviewed. Recommend that she proceed with cycle #7 today as scheduled.   I will arrange for a restaging CT scan of the chest prior to her next cycle of treatment.   We will see her back for a follow up visit in 3 weeks for evaluation before starting cycle #8.    Recommend that if she feels unsteady on her feet to use a cane or a walker.   The patient was advised to call immediately if she has any concerning symptoms in the interval. The patient voices understanding of current disease status and treatment options and is in agreement with the current care plan. All questions were answered. The patient knows to call the clinic with any problems, questions or concerns. We can certainly see the patient much sooner if necessary    Orders Placed This Encounter  Procedures  . CT Chest W Contrast    Standing Status:   Future    Standing Expiration Date:   03/29/2021    Order Specific Question:   If indicated for the ordered procedure, I authorize the administration of contrast media per Radiology protocol    Answer:   Yes    Order Specific Question:   Preferred imaging location?    Answer:   [facility]    Order Specific Question:   Radiology Contrast Protocol - do NOT remove file path    Answer:   [email or web address]     Trenton Verne L Shanan Mcmiller, PA-C 03/29/20

## 2020-03-29 ENCOUNTER — Inpatient Hospital Stay: Payer: Medicare Other

## 2020-03-29 ENCOUNTER — Inpatient Hospital Stay: Payer: Medicare Other | Attending: Internal Medicine | Admitting: Physician Assistant

## 2020-03-29 ENCOUNTER — Other Ambulatory Visit: Payer: Self-pay

## 2020-03-29 ENCOUNTER — Encounter: Payer: Self-pay | Admitting: Physician Assistant

## 2020-03-29 VITALS — BP 141/62 | HR 81 | Temp 98.6°F | Resp 20 | Ht 66.0 in | Wt 119.5 lb

## 2020-03-29 DIAGNOSIS — Z79899 Other long term (current) drug therapy: Secondary | ICD-10-CM | POA: Diagnosis not present

## 2020-03-29 DIAGNOSIS — Z7982 Long term (current) use of aspirin: Secondary | ICD-10-CM | POA: Insufficient documentation

## 2020-03-29 DIAGNOSIS — C3411 Malignant neoplasm of upper lobe, right bronchus or lung: Secondary | ICD-10-CM | POA: Diagnosis not present

## 2020-03-29 DIAGNOSIS — Z87891 Personal history of nicotine dependence: Secondary | ICD-10-CM | POA: Insufficient documentation

## 2020-03-29 DIAGNOSIS — I10 Essential (primary) hypertension: Secondary | ICD-10-CM | POA: Diagnosis not present

## 2020-03-29 DIAGNOSIS — E119 Type 2 diabetes mellitus without complications: Secondary | ICD-10-CM | POA: Diagnosis not present

## 2020-03-29 DIAGNOSIS — Z8673 Personal history of transient ischemic attack (TIA), and cerebral infarction without residual deficits: Secondary | ICD-10-CM | POA: Insufficient documentation

## 2020-03-29 DIAGNOSIS — C3491 Malignant neoplasm of unspecified part of right bronchus or lung: Secondary | ICD-10-CM | POA: Diagnosis not present

## 2020-03-29 DIAGNOSIS — Z5112 Encounter for antineoplastic immunotherapy: Secondary | ICD-10-CM | POA: Diagnosis not present

## 2020-03-29 DIAGNOSIS — E785 Hyperlipidemia, unspecified: Secondary | ICD-10-CM | POA: Diagnosis not present

## 2020-03-29 DIAGNOSIS — Z9049 Acquired absence of other specified parts of digestive tract: Secondary | ICD-10-CM | POA: Diagnosis not present

## 2020-03-29 DIAGNOSIS — Z95828 Presence of other vascular implants and grafts: Secondary | ICD-10-CM

## 2020-03-29 DIAGNOSIS — Z923 Personal history of irradiation: Secondary | ICD-10-CM | POA: Diagnosis not present

## 2020-03-29 DIAGNOSIS — Z7984 Long term (current) use of oral hypoglycemic drugs: Secondary | ICD-10-CM | POA: Insufficient documentation

## 2020-03-29 DIAGNOSIS — K219 Gastro-esophageal reflux disease without esophagitis: Secondary | ICD-10-CM | POA: Insufficient documentation

## 2020-03-29 LAB — CBC WITH DIFFERENTIAL (CANCER CENTER ONLY)
Abs Immature Granulocytes: 0.02 10*3/uL (ref 0.00–0.07)
Basophils Absolute: 0 10*3/uL (ref 0.0–0.1)
Basophils Relative: 0 %
Eosinophils Absolute: 0.2 10*3/uL (ref 0.0–0.5)
Eosinophils Relative: 2 %
HCT: 28.9 % — ABNORMAL LOW (ref 36.0–46.0)
Hemoglobin: 9.2 g/dL — ABNORMAL LOW (ref 12.0–15.0)
Immature Granulocytes: 0 %
Lymphocytes Relative: 10 %
Lymphs Abs: 1 10*3/uL (ref 0.7–4.0)
MCH: 29 pg (ref 26.0–34.0)
MCHC: 31.8 g/dL (ref 30.0–36.0)
MCV: 91.2 fL (ref 80.0–100.0)
Monocytes Absolute: 0.6 10*3/uL (ref 0.1–1.0)
Monocytes Relative: 6 %
Neutro Abs: 7.5 10*3/uL (ref 1.7–7.7)
Neutrophils Relative %: 82 %
Platelet Count: 445 10*3/uL — ABNORMAL HIGH (ref 150–400)
RBC: 3.17 MIL/uL — ABNORMAL LOW (ref 3.87–5.11)
RDW: 15.3 % (ref 11.5–15.5)
WBC Count: 9.3 10*3/uL (ref 4.0–10.5)
nRBC: 0 % (ref 0.0–0.2)

## 2020-03-29 LAB — CMP (CANCER CENTER ONLY)
ALT: <6 U/L (ref 0–44)
AST: 13 U/L — ABNORMAL LOW (ref 15–41)
Albumin: 2.6 g/dL — ABNORMAL LOW (ref 3.5–5.0)
Alkaline Phosphatase: 77 U/L (ref 38–126)
Anion gap: 5 (ref 5–15)
BUN: 16 mg/dL (ref 8–23)
CO2: 30 mmol/L (ref 22–32)
Calcium: 10 mg/dL (ref 8.9–10.3)
Chloride: 101 mmol/L (ref 98–111)
Creatinine: 0.79 mg/dL (ref 0.44–1.00)
GFR, Est AFR Am: >60 mL/min (ref >60)
GFR, Estimated: >60 mL/min (ref >60)
Glucose, Bld: 128 mg/dL — ABNORMAL HIGH (ref 70–99)
Potassium: 3.6 mmol/L (ref 3.5–5.1)
Sodium: 136 mmol/L (ref 135–145)
Total Bilirubin: 0.3 mg/dL (ref 0.3–1.2)
Total Protein: 6.8 g/dL (ref 6.5–8.1)

## 2020-03-29 LAB — TSH: TSH: 1.595 u[IU]/mL (ref 0.308–3.960)

## 2020-03-29 MED ORDER — SODIUM CHLORIDE 0.9% FLUSH
10.0000 mL | INTRAVENOUS | Status: DC | PRN
  Administered 2020-03-29: 10 mL
  Filled 2020-03-29: qty 10

## 2020-03-29 MED ORDER — SODIUM CHLORIDE 0.9 % IV SOLN
200.0000 mg | Freq: Once | INTRAVENOUS | Status: AC
  Administered 2020-03-29: 200 mg via INTRAVENOUS
  Filled 2020-03-29: qty 8

## 2020-03-29 MED ORDER — SODIUM CHLORIDE 0.9 % IV SOLN
Freq: Once | INTRAVENOUS | Status: AC
  Filled 2020-03-29: qty 250

## 2020-03-29 MED ORDER — ALTEPLASE 2 MG IJ SOLR
2.0000 mg | Freq: Once | INTRAMUSCULAR | Status: AC | PRN
  Administered 2020-03-29: 2 mg
  Filled 2020-03-29: qty 2

## 2020-03-29 MED ORDER — ALTEPLASE 2 MG IJ SOLR
INTRAMUSCULAR | Status: AC
  Filled 2020-03-29: qty 2

## 2020-03-29 MED ORDER — HEPARIN SOD (PORK) LOCK FLUSH 100 UNIT/ML IV SOLN
500.0000 [IU] | Freq: Once | INTRAVENOUS | Status: AC | PRN
  Administered 2020-03-29: 500 [IU]
  Filled 2020-03-29: qty 5

## 2020-03-29 MED ORDER — SODIUM CHLORIDE 0.9% FLUSH
10.0000 mL | INTRAVENOUS | Status: DC | PRN
  Filled 2020-03-29: qty 10

## 2020-03-29 NOTE — Patient Instructions (Signed)

## 2020-03-29 NOTE — Progress Notes (Signed)
CATHFLO given by Dondra Prader, RN at 1011. I drew blood peripherally.

## 2020-03-29 NOTE — Patient Instructions (Signed)
[facility] Discharge Instructions for Patients Receiving Chemotherapy  Today you received the following chemotherapy agents:  Keytruda.  To help prevent nausea and vomiting after your treatment, we encourage you to take your nausea medication as directed.   If you develop nausea and vomiting that is not controlled by your nausea medication, call the clinic.   BELOW ARE SYMPTOMS THAT SHOULD BE REPORTED IMMEDIATELY:  *FEVER GREATER THAN 100.5 F  *CHILLS WITH OR WITHOUT FEVER  NAUSEA AND VOMITING THAT IS NOT CONTROLLED WITH YOUR NAUSEA MEDICATION  *UNUSUAL SHORTNESS OF BREATH  *UNUSUAL BRUISING OR BLEEDING  TENDERNESS IN MOUTH AND THROAT WITH OR WITHOUT PRESENCE OF ULCERS  *URINARY PROBLEMS  *BOWEL PROBLEMS  UNUSUAL RASH Items with * indicate a potential emergency and should be followed up as soon as possible.  Feel free to call the clinic should you have any questions or concerns. The clinic phone number is [phone].  Please show the CHEMO ALERT CARD at check-in to the Emergency Department and triage nurse.    

## 2020-03-29 NOTE — Progress Notes (Signed)
Correction patient was sent back to the lab after one unsuccessful draw in flush.

## 2020-03-30 ENCOUNTER — Other Ambulatory Visit: Payer: Self-pay | Admitting: Internal Medicine

## 2020-04-07 ENCOUNTER — Ambulatory Visit (HOSPITAL_COMMUNITY)
Admission: RE | Admit: 2020-04-07 | Discharge: 2020-04-07 | Disposition: A | Discharge status: Home / Self Care | Payer: Medicare Other | Source: Ambulatory Visit | Attending: Physician Assistant | Admitting: Physician Assistant

## 2020-04-07 ENCOUNTER — Encounter (HOSPITAL_COMMUNITY): Payer: Self-pay

## 2020-04-07 ENCOUNTER — Other Ambulatory Visit: Payer: Self-pay

## 2020-04-07 DIAGNOSIS — C3490 Malignant neoplasm of unspecified part of unspecified bronchus or lung: Secondary | ICD-10-CM | POA: Diagnosis not present

## 2020-04-07 DIAGNOSIS — I700 Atherosclerosis of aorta: Secondary | ICD-10-CM | POA: Diagnosis not present

## 2020-04-07 DIAGNOSIS — J910 Malignant pleural effusion: Secondary | ICD-10-CM | POA: Diagnosis not present

## 2020-04-07 DIAGNOSIS — C3491 Malignant neoplasm of unspecified part of right bronchus or lung: Secondary | ICD-10-CM | POA: Insufficient documentation

## 2020-04-07 DIAGNOSIS — I2510 Atherosclerotic heart disease of native coronary artery without angina pectoris: Secondary | ICD-10-CM | POA: Diagnosis not present

## 2020-04-07 MED ORDER — IOHEXOL 300 MG/ML  SOLN
75.0000 mL | Freq: Once | INTRAMUSCULAR | Status: AC | PRN
  Administered 2020-04-07: 75 mL via INTRAVENOUS

## 2020-04-07 MED ORDER — HEPARIN SOD (PORK) LOCK FLUSH 100 UNIT/ML IV SOLN: 500.0000 [IU] | Freq: Once | INTRAVENOUS | Status: AC

## 2020-04-07 MED ORDER — HEPARIN SOD (PORK) LOCK FLUSH 100 UNIT/ML IV SOLN
INTRAVENOUS | Status: AC
  Administered 2020-04-07: 500 [IU]
  Filled 2020-04-07: qty 5

## 2020-04-11 ENCOUNTER — Encounter: Payer: Self-pay | Admitting: Family Medicine

## 2020-04-11 DIAGNOSIS — D531 Other megaloblastic anemias, not elsewhere classified: Secondary | ICD-10-CM | POA: Insufficient documentation

## 2020-04-12 ENCOUNTER — Other Ambulatory Visit: Payer: Self-pay

## 2020-04-12 ENCOUNTER — Other Ambulatory Visit: Payer: Medicare Other

## 2020-04-12 DIAGNOSIS — E538 Deficiency of other specified B group vitamins: Secondary | ICD-10-CM

## 2020-04-12 MED ORDER — CYANOCOBALAMIN 1000 MCG/ML IJ SOLN: 1000.0000 ug | Freq: Once | INTRAMUSCULAR | Status: AC

## 2020-04-15 NOTE — Progress Notes (Signed)
[facility] OFFICE PROGRESS NOTE  [name], [place] [address]  DIAGNOSIS: Recurrent non-small cell lung cancer likely squamous cell carcinoma that was initially diagnosed as a stage IIA(T2b, N0, M0) inAugust 2018status post curative stereotactic radiotherapy completed June 21, 2017. The patient presented today with concerning findings for disease recurrence and progression with enlarging and hypermetabolic right upper lobe lung mass presenting as stage IIIa (T3, N0, M0).  PDL 1 expression 0%.  PRIOR THERAPY: 1) Curative stereotactic radiotherapy completed on June 21, 2017. 2) A course of concurrent chemoradiation with chemotherapy consisting of carboplatin for an AUC of 2 and paclitaxel 45 mg/m.First dose started on 06/23/2018.Status post 7 cycles. Last dose of chemotherapy was given August 04, 2018. 3)SBRT to the enlarging left lower lobe pulmonary nodule under the care of Dr. Sondra Come. Last radiation treatment scheduled for 03/26/2019 4)Consolidation immunotherapy with Imfinzi 10 mg/KG every 2 weeks. Last dose given on2/22/21. Status post26cycles. 5) Systemic chemotherapy with carboplatin for an AUC of 5, Taxol 175 mg/m2, and Keytruda 200 mg IV every 3 weeks with Neulasta. Status post 7 cycles. First dose received on 11/25/19. Starting from cycle #5, she has been on maintenance immunotherapy with Keytruda.   CURRENT THERAPY: Palliative systemic chemotherapy with gemcitabine 1000 mg/m2 first dose expected on 04/26/2020.  INTERVAL HISTORY: [name] 79 y.o. female returns to the clinic today for a follow-up visit.  The patient is feeling fairly well today without any concerning complaints except for she did not eat breakfast today and is feeling a little dizzy. She also states she did not "eat as much as she should have" last night. The patient continues to tolerate treatment with immunotherapy with [facility] well  without any adverse side effects.  She denies any fever, chills, or night sweats. It looks like she lost about 2lbs since her last visit. She was scheduled to meet with a member of the nutritionist team while in the infusion room today.  She denies any chest pain, shortness of breath, or hemoptysis.  She reports her baseline mild dry cough.  She denies any nausea, vomiting, diarrhea, or constipation.  She denies any headache or visual changes. She denies any rashes or skin changes.  The patient recently had a restaging CT scan performed.  She is here today for evaluation and to review her scan results before starting cycle #8 of Keytruda.  MEDICAL HISTORY: Past Medical History:  Diagnosis Date  . Carotid artery occlusion   . Clostridium difficile infection 06/2013  . Diabetes mellitus    takes Janumet daily  . Dyslipidemia    takes Crestor daily  . Gallstones   . GERD (gastroesophageal reflux disease)   . Heart murmur    hx of  . History of radiation therapy 06/11/17-06/21/17   right lung 50 Gy in 5 fractions  . Hypertension    takes Prinzide and Verapamil daily  . Impaired speech    from stroke  . Neuropathy, diabetic ([place])   . nscl ca dx'd 2018   Lung   . Pneumonia   . PONV (postoperative nausea and vomiting)   . Seizures ([place]) 04/19/2018   recent hospitalization  . Smoker   . Stroke ([place]) 06/25/13  . Vertigo    but doesn't take any meds    ALLERGIES:  is allergic to codeine, benadryl [diphenhydramine], lipitor [atorvastatin], and phenergan [promethazine hcl].  MEDICATIONS:  Current Outpatient Medications  Medication Sig Dispense Refill  . aspirin EC 81 MG tablet  Take 81 mg by mouth daily.    . Blood Glucose Monitoring Suppl (ONE TOUCH ULTRA 2) w/Device KIT 1 Device by Does not apply route 2 (two) times daily. 1 each 0  . Cholecalciferol (VITAMIN D) 50 MCG (2000 UT) tablet Take 2,000 Units by mouth daily.    . Cyanocobalamin (B-12 COMPLIANCE INJECTION IJ) Inject 1 Dose as  directed every 30 (thirty) days.    . divalproex (DEPAKOTE ER) 500 MG 24 hr tablet Take one tablet  twice daily (Patient taking differently: Take 500 mg by mouth 2 (two) times daily. ) 60 tablet 6  . glucose blood (ONE TOUCH ULTRA TEST) test strip Use as instructed 100 each 12  . JANUMET XR 50-1000 MG TB24 TAKE 1 TABLET BY MOUTH DAILY (Patient taking differently: Take 1 tablet by mouth daily. ) 60 tablet 5  . Lancets (ONETOUCH ULTRASOFT) lancets Use as instructed 100 each 12  . lidocaine-prilocaine (EMLA) cream APPLY TO THE AFFECTED AREA DAILY AS NEEDED 30 g 0  . lisinopril (ZESTRIL) 10 MG tablet TAKE 1 TABLET(10 MG) BY MOUTH DAILY (Patient taking differently: Take 10 mg by mouth daily. ) 90 tablet 3  . Multiple Vitamin (MULTIVITAMIN WITH MINERALS) TABS tablet Take 1 tablet by mouth daily.    . pioglitazone (ACTOS) 30 MG tablet TAKE 1 TABLET(30 MG) BY MOUTH DAILY (Patient taking differently: Take 30 mg by mouth daily. ) 90 tablet 1  . rosuvastatin (CRESTOR) 20 MG tablet TAKE 1 TABLET BY MOUTH ONCE DAILY (Patient taking differently: Take 20 mg by mouth daily. ) 180 tablet 0  . vitamin B-12 (CYANOCOBALAMIN) 100 MCG tablet Take 100 mcg by mouth daily.     Current Facility-Administered Medications  Medication Dose Route Frequency Provider Last Rate Last Admin  . cyanocobalamin ((VITAMIN B-12)) injection 1,000 mcg  1,000 mcg Intramuscular Once [name], MD      . cyanocobalamin ((VITAMIN B-12)) injection 1,000 mcg  1,000 mcg Intramuscular Once [name], MD      . sodium chloride flush (NS) 0.9 % injection 10 mL  10 mL Intracatheter PRN Curt Bears, MD   10 mL at 04/19/20 1136   Facility-Administered Medications Ordered in Other Visits  Medication Dose Route Frequency Provider Last Rate Last Admin  . heparin lock flush 100 unit/mL  500 Units Intracatheter Once PRN Curt Bears, MD      . sodium chloride flush (NS) 0.9 % injection 10 mL  10 mL Intracatheter PRN Curt Bears,  MD        SURGICAL HISTORY:  Past Surgical History:  Procedure Laterality Date  . cataract removed Right   . CHOLECYSTECTOMY    . COLONOSCOPY    . ENDARTERECTOMY Left 07/14/2013   Procedure: ENDARTERECTOMY CAROTID-LEFT;  Surgeon: Elam Dutch, MD;  Location: [facility];  Service: Vascular;  Laterality: Left;  . EYE SURGERY Left    cataract  . IR CV LINE INJECTION  05/13/2019  . IR CV LINE INJECTION  03/11/2020  . IR IMAGING GUIDED PORT INSERTION  09/15/2018  . IR PATIENT EVAL TECH 0-60 MINS  05/08/2019  . IR PERC PLEURAL DRAIN W/INDWELL CATH W/IMG GUIDE  05/23/2017  . KNEE SURGERY Left 40yr ago  . LAPAROSCOPIC PARTIAL COLECTOMY N/A 09/02/2015   Procedure: LAPAROSCOPIC PARTIAL RIGHT COLECTOMY;  Surgeon: ALeighton Ruff MD;  Location: [facility];  Service: General;  Laterality: N/A;  . PATCH ANGIOPLASTY Left 07/14/2013   Procedure: LEFT CAROTID ARTERY PATCH ANGIOPLASTY;  Surgeon: CElam Dutch MD;  Location: [facility] OR;  Service: Vascular;  Laterality: Left;  Marland Kitchen VIDEO BRONCHOSCOPY N/A 06/04/2018   Procedure: VIDEO BRONCHOSCOPY;  Surgeon: Melrose Nakayama, MD;  Location: [facility];  Service: Thoracic;  Laterality: N/A;  . wisdom      REVIEW OF SYSTEMS:   Review of Systems  Constitutional: Positive for 2 lb weight loss. Negative for appetite change, chills, fatigue, and fever.  HENT: Negative for mouth sores, nosebleeds, sore throat and trouble swallowing.   Eyes: Negative for eye problems and icterus.  Respiratory: Positive for mild baseline dry cough. Negative for hemoptysis, shortness of breath and wheezing.   Cardiovascular: Negative for chest pain and leg swelling.  Gastrointestinal: Negative for abdominal pain, constipation, diarrhea, nausea and vomiting.  Genitourinary: Negative for bladder incontinence, difficulty urinating, dysuria, frequency and hematuria.   Musculoskeletal: Negative for back pain, gait problem, neck pain and neck stiffness.  Skin: Negative for itching and rash.   Neurological: Positive for dizziness. Negative for extremity weakness, gait problem, headaches, light-headedness and seizures.  Hematological: Negative for adenopathy. Does not bruise/bleed easily.  Psychiatric/Behavioral: Negative for confusion, depression and sleep disturbance. The patient is not nervous/anxious.     PHYSICAL EXAMINATION:  Blood pressure (!) 152/67, pulse 79, temperature 97.8 F (36.6 C), temperature source Tympanic, resp. rate 18, height _0  (1.676 m), weight 117 lb 3.2 oz (53.2 kg), SpO2 100 %.  ECOG PERFORMANCE STATUS: 1 - Symptomatic but completely ambulatory  Physical Exam  Constitutional: Oriented to person, place, and time and well-developed, well-nourished, and in no distress.  HENT:  Head: Normocephalic and atraumatic.  Mouth/Throat: Oropharynx is clear and moist. No oropharyngeal exudate.  Eyes: Conjunctivae are normal. Right eye exhibits no discharge. Left eye exhibits no discharge. No scleral icterus.  Neck: Normal range of motion. Neck supple.  Cardiovascular: Normal rate, regular rhythm, normal heart sounds and intact distal pulses.   Pulmonary/Chest: Effort normal. Decreased breath sounds in right lung. No respiratory distress. No wheezes. No rales.  Abdominal: Soft. Bowel sounds are normal. Exhibits no distension and no mass. There is no tenderness.  Musculoskeletal: Normal range of motion. Exhibits no edema.  Lymphadenopathy:    No cervical adenopathy.  Neurological: Alert and oriented to person, place, and time. Exhibits normal muscle tone. Gait normal. Coordination normal.  Skin: Skin is warm and dry. No rash noted. Not diaphoretic. No erythema. No pallor.  Psychiatric: Mood, memory and judgment normal.  Vitals reviewed.  LABORATORY DATA: Lab Results  Component Value Date   WBC 9.6 04/19/2020   HGB 10.1 (L) 04/19/2020   HCT 31.6 (L) 04/19/2020   MCV 87.3 04/19/2020   PLT 532 (H) 04/19/2020      Chemistry      Component Value Date/Time    NA 134 (L) 04/19/2020 0954   NA 139 05/07/2018 1147   K 4.2 04/19/2020 0954   CL 100 04/19/2020 0954   CO2 27 04/19/2020 0954   BUN 17 04/19/2020 0954   BUN 15 05/07/2018 1147   CREATININE 0.82 04/19/2020 0954   CREATININE 0.87 03/08/2015 0001      Component Value Date/Time   CALCIUM 11.2 (H) 04/19/2020 0954   ALKPHOS 93 04/19/2020 0954   AST 13 (L) 04/19/2020 0954   ALT 9 04/19/2020 0954   BILITOT 0.3 04/19/2020 0954       RADIOGRAPHIC STUDIES:  CT Chest W Contrast  Result Date: 04/08/2020 CLINICAL DATA:  Non-small cell lung cancer, assess treatment response EXAM: CT CHEST WITH CONTRAST TECHNIQUE: Multidetector CT imaging  of the chest was performed during intravenous contrast administration. CONTRAST:  79m OMNIPAQUE IOHEXOL 300 MG/ML  SOLN COMPARISON:  January 26, 2020 FINDINGS: Cardiovascular: RIGHT-sided Port-A-Cath in place terminating in the distal superior vena cava. Three-vessel coronary artery disease with similar appearance to prior study. Heart size is stable without pericardial effusion. Generalize calcified and noncalcified atheromatous plaque of the thoracic aorta. Central pulmonary vasculature is normal on venous phase assessment. Mediastinum/Nodes: Increased size of by hilar nodal enlargement. The RIGHT juxta hilar nodal mass (image 57, series 2) 2.9 x 2.3 cm previously approximately 2.5 cm when combined dimensions of 2 adjacent lymph nodes are some. This is contiguous with RIGHT upper lobe mass, see below. LEFT infrahilar mass and or nodal involvement measuring 2.7 x 3.6 cm as compared to 2.3 x 3.0 cm. This involves the superior segment of the LEFT lower lobe extending from the LEFT infrahilar region. Extract superiorly along the fissure distorting the major fissure. No enlarged lymph nodes elsewhere in the chest. Lungs/Pleura: Enlarging RIGHT upper lobe mass like area amidst post treatment changes showing irregular internal enhancement. Area measuring 6.3 x 4.4 cm as  compared to 3.9 x 5.2 cm as measured on image 35 of series 2 Nodularity along the RIGHT hemidiaphragm, along the RIGHT liver margin. Pleural effusion with thickened "rind" and adjacent areas of rounded basilar consolidative change. These areas show no interval change since the previous study. Areas in the inferior RIGHT pleural space with nodularity (image 122 series 2, 1.5 cm area of RIGHT costodiaphragmatic nodularity at the inferior margin of the pleural space grossly similar to the prior study. Upper Abdomen: Nodular contour along the liver margin favored to represent diaphragmatic involvement on image 118 of series 2. The imaged portions of upper abdominal viscera without acute process. Post cholecystectomy. Nodularity of the LEFT adrenal gland with stable appearance. Pancreas is normal. Musculoskeletal: No acute musculoskeletal process. No destructive bone finding. IMPRESSION: 1. Worsening of disease with enlarging and more heterogeneous appearance of RIGHT upper lobe mass with convex margins and associated RIGHT juxta hilar adenopathy also increased in size. 2. LEFT lower lobe mass and or adenopathy with increased size with similar appearance. 3. RIGHT sided pleural effusion presumably malignant with some areas of subtle nodularity along the RIGHT inferior costodiaphragmatic sulcus laterally. 4. Stable appearance of left adrenal gland nodularity. Attention on follow-up 5. Three-vessel coronary artery disease. 6. Aortic atherosclerosis. Aortic Atherosclerosis (ICD10-I70.0). Electronically Signed   By: GZetta BillsM.D.   On: 04/08/2020 08:26     ASSESSMENT/PLAN:  This is a very pleasant 79year old Caucasian female with recurrentstage IIIAnon-small cell lung cancer, squamous cell carcinomaof the right upper lobe. She presented initially as a stage IIa in August of 2018.  The patient underwent a course of concurrent chemoradiation with weekly carboplatin and paclitaxel. She status post 7 cycles.  She tolerated treatment well. She then underwent consolidation immunotherapy with Imfinzi 10 mg/kg IV every 2 weeks. She is status post26cycles.  She tolerated it well without any adverse side effects.  She recently completed SBRT under the care of Dr. KSondra Cometo the enlarging left pulmonary nodule on 03/26/2019.  She recently showed evidence of disease progression and started on treatment with carboplatin for an AUC of 5, paclitaxel 175 mg/m2 and Keytruda 200 mg IV every 3 weeks. She is status post 7 cycles. Starting from cycle #5, she has been [place] maintenance [place]   The patient recently had a restaging CT scan performed.  Dr. MJulien Nordmannpersonally and independently reviewed the scan  and discussed the results with the patient.  The scan showed evidence of disease progression with enlarging and more heterogeneous appearance of the right upper lobe lung mass as well as increase in the right hilar adenopathy.  There is also interval increase in the left lower lobe lung mass and right-sided pleural effusion.  There is also subtle nodularity in the inferior costo diaphragmatic sulcus on the right.   Dr. Julien Nordmann had a lengthy discussion with the patient about her current conditions and recommended treatment options.  Dr. Julien Nordmann discussed a referral to palliative and hospice care versus changing treatment to palliative systemic chemotherapy.  The options for systemic chemotherapy include gemcitabine 1000 mg on days one and 8  IV every 3 weeks. The patient is likely not a good candidate for systemic chemotherapy with docetaxel and cyramza given her age and comorbidities.    The patient is interested in proceeding with single agent palliative systemic chemotherapy with gemcitabine 1000 mg/m2 on days 1 and 8 IV every 3 weeks. I will arrange for the patient's first cycle of treatment to be given on 04/26/20  The adverse side effects of treatment were discussed including but not limited to  myelosuppression, alopecia, nausea, vomiting, kidney liver dysfunction.  We will see the patient back for follow-up visit in 2 weeks for evaluation in 1 week follow-up visit and to manage any adverse side effects of treatment.    The patient was advised to call immediately if she has any concerning symptoms in the interval. The patient voices understanding of current disease status and treatment options and is in agreement with the current care plan. All questions were answered. The patient knows to call the clinic with any problems, questions or concerns. We can certainly see the patient much sooner if necessary    Orders Placed This Encounter  Procedures  . CBC with Differential (Cancer Center Only)    Standing Status:   Standing    Number of Occurrences:   18    Standing Expiration Date:   04/19/2021  . CMP ([facility] only)    Standing Status:   Standing    Number of Occurrences:   18    Standing Expiration Date:   04/19/2021     Tobe Sos Krishang Reading, PA-C 04/19/20   ADDENDUM: Hematology/Oncology Attending: I had a face-to-face encounter with the patient today.  I recommended her care plan.  This is a very pleasant 79 years old white female with recurrent non-small cell lung cancer that was initially diagnosed as a stage IIIa squamous cell carcinoma in August 2018.  The patient is status post a course of concurrent chemoradiation with weekly carboplatin and paclitaxel followed by 1 year of treatment with Imfinzi.  She had evidence for disease progression after completion of her immunotherapy and the patient was started on systemic chemotherapy with carboplatin, paclitaxel and Keytruda initially for 4 cycles followed by maintenance treatment with [facility] for 3 more cycles. She had repeat CT scan of the chest performed recently.  I personally and independently reviewed the scan images and discussed the results with the patient and her daughter [name] who was available by phone  during the visit. Unfortunately her scan showed evidence for disease progression with enlargement of the right and left lung masses. I discussed with the patient several options for her condition including palliative care and hospice versus treatment with second line chemotherapy with single agent gemcitabine 1000 mg/M2 on days 1 and 8 every 3 weeks.  I do not think  the patient will be able to tolerate treatment with docetaxel and Cyramza with her current condition. The patient would like to proceed with the treatment with gemcitabine and she is expected to start the first cycle of this treatment next week. We discussed with the patient the adverse effect of this treatment including but not limited to alopecia, myelosuppression, nausea and vomiting, peripheral neuropathy, liver or renal dysfunction. She will come back for follow-up visit in 2 weeks for evaluation with day 8 of cycle #1. She was advised to call immediately if she has any concerning symptoms in the interval.  Disclaimer: This note was dictated with voice recognition software. Similar sounding words can inadvertently be transcribed and may be missed upon review. Eilleen Kempf, MD 04/19/20

## 2020-04-19 ENCOUNTER — Encounter: Payer: Self-pay | Admitting: Physician Assistant

## 2020-04-19 ENCOUNTER — Inpatient Hospital Stay: Payer: Medicare Other

## 2020-04-19 ENCOUNTER — Other Ambulatory Visit: Payer: Self-pay | Admitting: Internal Medicine

## 2020-04-19 ENCOUNTER — Inpatient Hospital Stay: Payer: Medicare Other | Attending: Internal Medicine | Admitting: Physician Assistant

## 2020-04-19 ENCOUNTER — Other Ambulatory Visit: Payer: Self-pay

## 2020-04-19 VITALS — BP 152/67 | HR 79 | Temp 97.8°F | Resp 18 | Ht 66.0 in | Wt 117.2 lb

## 2020-04-19 DIAGNOSIS — Z7984 Long term (current) use of oral hypoglycemic drugs: Secondary | ICD-10-CM | POA: Diagnosis not present

## 2020-04-19 DIAGNOSIS — Z5111 Encounter for antineoplastic chemotherapy: Secondary | ICD-10-CM | POA: Diagnosis not present

## 2020-04-19 DIAGNOSIS — C3411 Malignant neoplasm of upper lobe, right bronchus or lung: Secondary | ICD-10-CM | POA: Diagnosis not present

## 2020-04-19 DIAGNOSIS — Z79899 Other long term (current) drug therapy: Secondary | ICD-10-CM | POA: Insufficient documentation

## 2020-04-19 DIAGNOSIS — E785 Hyperlipidemia, unspecified: Secondary | ICD-10-CM | POA: Insufficient documentation

## 2020-04-19 DIAGNOSIS — Z7189 Other specified counseling: Secondary | ICD-10-CM

## 2020-04-19 DIAGNOSIS — I2510 Atherosclerotic heart disease of native coronary artery without angina pectoris: Secondary | ICD-10-CM | POA: Diagnosis not present

## 2020-04-19 DIAGNOSIS — E119 Type 2 diabetes mellitus without complications: Secondary | ICD-10-CM | POA: Insufficient documentation

## 2020-04-19 DIAGNOSIS — K219 Gastro-esophageal reflux disease without esophagitis: Secondary | ICD-10-CM | POA: Diagnosis not present

## 2020-04-19 DIAGNOSIS — Z95828 Presence of other vascular implants and grafts: Secondary | ICD-10-CM

## 2020-04-19 DIAGNOSIS — D531 Other megaloblastic anemias, not elsewhere classified: Secondary | ICD-10-CM

## 2020-04-19 DIAGNOSIS — Z923 Personal history of irradiation: Secondary | ICD-10-CM | POA: Diagnosis not present

## 2020-04-19 DIAGNOSIS — C3491 Malignant neoplasm of unspecified part of right bronchus or lung: Secondary | ICD-10-CM

## 2020-04-19 DIAGNOSIS — F17210 Nicotine dependence, cigarettes, uncomplicated: Secondary | ICD-10-CM | POA: Diagnosis not present

## 2020-04-19 DIAGNOSIS — Z8673 Personal history of transient ischemic attack (TIA), and cerebral infarction without residual deficits: Secondary | ICD-10-CM | POA: Insufficient documentation

## 2020-04-19 DIAGNOSIS — Z7982 Long term (current) use of aspirin: Secondary | ICD-10-CM | POA: Insufficient documentation

## 2020-04-19 DIAGNOSIS — Z9049 Acquired absence of other specified parts of digestive tract: Secondary | ICD-10-CM | POA: Diagnosis not present

## 2020-04-19 DIAGNOSIS — Z5112 Encounter for antineoplastic immunotherapy: Secondary | ICD-10-CM | POA: Diagnosis present

## 2020-04-19 DIAGNOSIS — I10 Essential (primary) hypertension: Secondary | ICD-10-CM | POA: Insufficient documentation

## 2020-04-19 LAB — CBC WITH DIFFERENTIAL (CANCER CENTER ONLY)
Abs Immature Granulocytes: 0.03 10*3/uL (ref 0.00–0.07)
Basophils Absolute: 0 10*3/uL (ref 0.0–0.1)
Basophils Relative: 0 %
Eosinophils Absolute: 0.2 10*3/uL (ref 0.0–0.5)
Eosinophils Relative: 2 %
HCT: 31.6 % — ABNORMAL LOW (ref 36.0–46.0)
Hemoglobin: 10.1 g/dL — ABNORMAL LOW (ref 12.0–15.0)
Immature Granulocytes: 0 %
Lymphocytes Relative: 12 %
Lymphs Abs: 1.2 10*3/uL (ref 0.7–4.0)
MCH: 27.9 pg (ref 26.0–34.0)
MCHC: 32 g/dL (ref 30.0–36.0)
MCV: 87.3 fL (ref 80.0–100.0)
Monocytes Absolute: 0.5 10*3/uL (ref 0.1–1.0)
Monocytes Relative: 6 %
Neutro Abs: 7.6 10*3/uL (ref 1.7–7.7)
Neutrophils Relative %: 80 %
Platelet Count: 532 10*3/uL — ABNORMAL HIGH (ref 150–400)
RBC: 3.62 MIL/uL — ABNORMAL LOW (ref 3.87–5.11)
RDW: 14.9 % (ref 11.5–15.5)
WBC Count: 9.6 10*3/uL (ref 4.0–10.5)
nRBC: 0 % (ref 0.0–0.2)

## 2020-04-19 LAB — CMP (CANCER CENTER ONLY)
ALT: 9 U/L (ref 0–44)
AST: 13 U/L — ABNORMAL LOW (ref 15–41)
Albumin: 2.8 g/dL — ABNORMAL LOW (ref 3.5–5.0)
Alkaline Phosphatase: 93 U/L (ref 38–126)
Anion gap: 7 (ref 5–15)
BUN: 17 mg/dL (ref 8–23)
CO2: 27 mmol/L (ref 22–32)
Calcium: 11.2 mg/dL — ABNORMAL HIGH (ref 8.9–10.3)
Chloride: 100 mmol/L (ref 98–111)
Creatinine: 0.82 mg/dL (ref 0.44–1.00)
GFR, Estimated: >60 mL/min (ref >60)
Glucose, Bld: 125 mg/dL — ABNORMAL HIGH (ref 70–99)
Potassium: 4.2 mmol/L (ref 3.5–5.1)
Sodium: 134 mmol/L — ABNORMAL LOW (ref 135–145)
Total Bilirubin: 0.3 mg/dL (ref 0.3–1.2)
Total Protein: 7.3 g/dL (ref 6.5–8.1)

## 2020-04-19 LAB — TSH: TSH: 1.643 u[IU]/mL (ref 0.308–3.960)

## 2020-04-19 MED ORDER — SODIUM CHLORIDE 0.9% FLUSH
10.0000 mL | INTRAVENOUS | Status: DC | PRN
  Administered 2020-04-19: 10 mL
  Filled 2020-04-19: qty 10

## 2020-04-19 MED ORDER — ALTEPLASE 2 MG IJ SOLR
2.0000 mg | Freq: Once | INTRAMUSCULAR | Status: DC | PRN
  Filled 2020-04-19: qty 2

## 2020-04-19 MED ORDER — HEPARIN SOD (PORK) LOCK FLUSH 100 UNIT/ML IV SOLN
500.0000 [IU] | Freq: Once | INTRAVENOUS | Status: AC | PRN
  Administered 2020-04-19: 500 [IU]
  Filled 2020-04-19: qty 5

## 2020-04-19 MED ORDER — ALTEPLASE 2 MG IJ SOLR
INTRAMUSCULAR | Status: AC
  Filled 2020-04-19: qty 2

## 2020-04-19 NOTE — Patient Instructions (Signed)
-  The treatment that you will receive consists of one chemotherapy drug, called gemcitabine.  -We are planning on starting your treatment next week on 04/26/20. -Your treatment will be administered weekly for 2 weeks and then you will get the third week off then you will come in for two weeks, then off a week and so on. We will check your labs on the days you come in for treatment. -We will get a CT scan after 9 weeks or so of treatment.  Medications:  -Compazine was sent to your pharmacy. This medication is for nausea. You may take this every 6 hours as needed if you feel nausous.   Follow up:  -We will see you back for a follow up visit in about  2 weeks to see how your first treatment went and to make sure you are not having any side effects from treatment.   If you need to contact our office, please do not hesitate, we are here to help. Our number is [phone]. When you call, ask to speak to Cassie's or Dr. Worthy Flank nurse.

## 2020-04-19 NOTE — Progress Notes (Signed)
Unable to get blood return from port.  RN administered Cathflo to port.  Patient sent back to lab for blood draw.

## 2020-04-19 NOTE — Patient Instructions (Signed)

## 2020-04-19 NOTE — Progress Notes (Signed)
Nutrition  RD planning on meeting with patient during infusion today but infusion cancelled.  Will reschedule follow-up.   Jakson Delpilar B. Zenia Resides, [place], [place] Registered Dietitian [phone] (mobile)

## 2020-04-19 NOTE — Progress Notes (Signed)
DISCONTINUE ON PATHWAY REGIMEN - Non-Small Cell Lung     A cycle is every 21 days:     Pembrolizumab      Paclitaxel      Carboplatin   **Always confirm dose/schedule in your pharmacy ordering system**  REASON: Disease Progression PRIOR TREATMENT: [id number]: Pembrolizumab 200 mg + Carboplatin AUC=6 + Paclitaxel 200 mg/m2 q21 Days x 4 Cycles TREATMENT RESPONSE: Progressive Disease (PD)  START OFF PATHWAY REGIMEN - Non-Small Cell Lung   [id number]:Gemcitabine 1,000 mg/m2 D1, 8  q21 Days:   A cycle is every 21 days:     Gemcitabine   **Always confirm dose/schedule in your pharmacy ordering system**  Patient Characteristics: Stage IV Metastatic, Squamous, PS = 0, 1, Third Line, Prior PD-1/PD-L1 Inhibitor or No Prior PD-1/PD-L1 Inhibitor and Not a Candidate for Immunotherapy Therapeutic Status: Stage IV Metastatic Histology: Squamous Cell Line of therapy: Third Engineer, maintenance (IT) Status: 1 PD-L1 Expression Status: PD-L1 Negative Immunotherapy Candidate Status: Not a Candidate for Immunotherapy Prior Immunotherapy Status: Prior PD-1/PD-L1 Inhibitor Intent of Therapy: Non-Curative / Palliative Intent, Discussed with Patient

## 2020-04-26 ENCOUNTER — Inpatient Hospital Stay: Payer: Medicare Other

## 2020-04-26 ENCOUNTER — Other Ambulatory Visit: Payer: Self-pay | Admitting: Physician Assistant

## 2020-04-26 ENCOUNTER — Other Ambulatory Visit: Payer: Self-pay

## 2020-04-26 VITALS — BP 141/64 | HR 88 | Temp 97.8°F | Resp 16 | Wt 115.5 lb

## 2020-04-26 DIAGNOSIS — C3411 Malignant neoplasm of upper lobe, right bronchus or lung: Secondary | ICD-10-CM | POA: Diagnosis not present

## 2020-04-26 DIAGNOSIS — E119 Type 2 diabetes mellitus without complications: Secondary | ICD-10-CM | POA: Diagnosis not present

## 2020-04-26 DIAGNOSIS — K219 Gastro-esophageal reflux disease without esophagitis: Secondary | ICD-10-CM | POA: Diagnosis not present

## 2020-04-26 DIAGNOSIS — E785 Hyperlipidemia, unspecified: Secondary | ICD-10-CM | POA: Diagnosis not present

## 2020-04-26 DIAGNOSIS — I10 Essential (primary) hypertension: Secondary | ICD-10-CM | POA: Diagnosis not present

## 2020-04-26 DIAGNOSIS — Z9049 Acquired absence of other specified parts of digestive tract: Secondary | ICD-10-CM | POA: Diagnosis not present

## 2020-04-26 DIAGNOSIS — F17210 Nicotine dependence, cigarettes, uncomplicated: Secondary | ICD-10-CM | POA: Diagnosis not present

## 2020-04-26 DIAGNOSIS — Z7982 Long term (current) use of aspirin: Secondary | ICD-10-CM | POA: Diagnosis not present

## 2020-04-26 DIAGNOSIS — Z7984 Long term (current) use of oral hypoglycemic drugs: Secondary | ICD-10-CM | POA: Diagnosis not present

## 2020-04-26 DIAGNOSIS — C3491 Malignant neoplasm of unspecified part of right bronchus or lung: Secondary | ICD-10-CM

## 2020-04-26 DIAGNOSIS — Z5111 Encounter for antineoplastic chemotherapy: Secondary | ICD-10-CM | POA: Diagnosis not present

## 2020-04-26 DIAGNOSIS — Z79899 Other long term (current) drug therapy: Secondary | ICD-10-CM | POA: Diagnosis not present

## 2020-04-26 DIAGNOSIS — I2510 Atherosclerotic heart disease of native coronary artery without angina pectoris: Secondary | ICD-10-CM | POA: Diagnosis not present

## 2020-04-26 DIAGNOSIS — Z923 Personal history of irradiation: Secondary | ICD-10-CM | POA: Diagnosis not present

## 2020-04-26 DIAGNOSIS — Z8673 Personal history of transient ischemic attack (TIA), and cerebral infarction without residual deficits: Secondary | ICD-10-CM | POA: Diagnosis not present

## 2020-04-26 LAB — CMP (CANCER CENTER ONLY)
ALT: 7 U/L (ref 0–44)
AST: 10 U/L — ABNORMAL LOW (ref 15–41)
Albumin: 2.7 g/dL — ABNORMAL LOW (ref 3.5–5.0)
Alkaline Phosphatase: 80 U/L (ref 38–126)
Anion gap: 6 (ref 5–15)
BUN: 16 mg/dL (ref 8–23)
CO2: 31 mmol/L (ref 22–32)
Calcium: 11.7 mg/dL — ABNORMAL HIGH (ref 8.9–10.3)
Chloride: 101 mmol/L (ref 98–111)
Creatinine: 0.88 mg/dL (ref 0.44–1.00)
GFR, Estimated: >60 mL/min (ref >60)
Glucose, Bld: 139 mg/dL — ABNORMAL HIGH (ref 70–99)
Potassium: 3.9 mmol/L (ref 3.5–5.1)
Sodium: 138 mmol/L (ref 135–145)
Total Bilirubin: 0.2 mg/dL — ABNORMAL LOW (ref 0.3–1.2)
Total Protein: 7 g/dL (ref 6.5–8.1)

## 2020-04-26 LAB — CBC WITH DIFFERENTIAL (CANCER CENTER ONLY)
Abs Immature Granulocytes: 0.04 10*3/uL (ref 0.00–0.07)
Basophils Absolute: 0 10*3/uL (ref 0.0–0.1)
Basophils Relative: 0 %
Eosinophils Absolute: 0.3 10*3/uL (ref 0.0–0.5)
Eosinophils Relative: 3 %
HCT: 29.5 % — ABNORMAL LOW (ref 36.0–46.0)
Hemoglobin: 9.4 g/dL — ABNORMAL LOW (ref 12.0–15.0)
Immature Granulocytes: 0 %
Lymphocytes Relative: 10 %
Lymphs Abs: 1.1 10*3/uL (ref 0.7–4.0)
MCH: 27.5 pg (ref 26.0–34.0)
MCHC: 31.9 g/dL (ref 30.0–36.0)
MCV: 86.3 fL (ref 80.0–100.0)
Monocytes Absolute: 0.8 10*3/uL (ref 0.1–1.0)
Monocytes Relative: 7 %
Neutro Abs: 8.7 10*3/uL — ABNORMAL HIGH (ref 1.7–7.7)
Neutrophils Relative %: 80 %
Platelet Count: 522 10*3/uL — ABNORMAL HIGH (ref 150–400)
RBC: 3.42 MIL/uL — ABNORMAL LOW (ref 3.87–5.11)
RDW: 14.6 % (ref 11.5–15.5)
WBC Count: 10.9 10*3/uL — ABNORMAL HIGH (ref 4.0–10.5)
nRBC: 0 % (ref 0.0–0.2)

## 2020-04-26 MED ORDER — SODIUM CHLORIDE 0.9% FLUSH
10.0000 mL | INTRAVENOUS | Status: DC | PRN
  Administered 2020-04-26: 10 mL
  Filled 2020-04-26: qty 10

## 2020-04-26 MED ORDER — SODIUM CHLORIDE 0.9 % IV SOLN
Freq: Once | INTRAVENOUS | Status: AC
  Filled 2020-04-26: qty 250

## 2020-04-26 MED ORDER — HEPARIN SOD (PORK) LOCK FLUSH 100 UNIT/ML IV SOLN
500.0000 [IU] | Freq: Once | INTRAVENOUS | Status: AC | PRN
  Administered 2020-04-26: 500 [IU]
  Filled 2020-04-26: qty 5

## 2020-04-26 MED ORDER — PROCHLORPERAZINE MALEATE 10 MG PO TABS
ORAL_TABLET | ORAL | Status: AC
  Filled 2020-04-26: qty 1

## 2020-04-26 MED ORDER — PROCHLORPERAZINE MALEATE 10 MG PO TABS
10.0000 mg | ORAL_TABLET | Freq: Once | ORAL | Status: AC
  Administered 2020-04-26: 10 mg via ORAL

## 2020-04-26 MED ORDER — PROCHLORPERAZINE MALEATE 10 MG PO TABS: 10.0000 mg | ORAL_TABLET | Freq: Four times a day (QID) | ORAL | 2 refills | Status: AC | PRN

## 2020-04-26 MED ORDER — SODIUM CHLORIDE 0.9 % IV SOLN
1000.0000 mg/m2 | Freq: Once | INTRAVENOUS | Status: AC
  Administered 2020-04-26: 1558 mg via INTRAVENOUS
  Filled 2020-04-26: qty 40.98

## 2020-04-26 NOTE — Patient Instructions (Addendum)
[facility] Discharge Instructions for Patients Receiving Chemotherapy  Today you received the following chemotherapy agents: gemcitabine.  To help prevent nausea and vomiting after your treatment, we encourage you to take your nausea medication as directed.   If you develop nausea and vomiting that is not controlled by your nausea medication, call the clinic.   BELOW ARE SYMPTOMS THAT SHOULD BE REPORTED IMMEDIATELY:  *FEVER GREATER THAN 100.5 F  *CHILLS WITH OR WITHOUT FEVER  NAUSEA AND VOMITING THAT IS NOT CONTROLLED WITH YOUR NAUSEA MEDICATION  *UNUSUAL SHORTNESS OF BREATH  *UNUSUAL BRUISING OR BLEEDING  TENDERNESS IN MOUTH AND THROAT WITH OR WITHOUT PRESENCE OF ULCERS  *URINARY PROBLEMS  *BOWEL PROBLEMS  UNUSUAL RASH Items with * indicate a potential emergency and should be followed up as soon as possible.  Feel free to call the clinic should you have any questions or concerns. The clinic phone number is [phone].  Please show the [place] at check-in to the Emergency Department and triage nurse.  Gemcitabine injection What is this medicine? GEMCITABINE (jem SYE ta been) is a chemotherapy drug. This medicine is used to treat many types of cancer like breast cancer, lung cancer, pancreatic cancer, and ovarian cancer. This medicine may be used for other purposes; ask your health care provider or pharmacist if you have questions. COMMON BRAND NAME(S): Gemzar, Infugem What should I tell my health care provider before I take this medicine? They need to know if you have any of these conditions:  blood disorders  infection  kidney disease  liver disease  lung or breathing disease, like asthma  recent or ongoing radiation therapy  an unusual or allergic reaction to gemcitabine, other chemotherapy, other medicines, foods, dyes, or preservatives  pregnant or trying to get pregnant  breast-feeding How should I use this medicine? This  drug is given as an infusion into a vein. It is administered in a hospital or clinic by a specially trained health care professional. Talk to your pediatrician regarding the use of this medicine in children. Special care may be needed. Overdosage: If you think you have taken too much of this medicine contact a poison control center or emergency room at once. NOTE: This medicine is only for you. Do not share this medicine with others. What if I miss a dose? It is important not to miss your dose. Call your doctor or health care professional if you are unable to keep an appointment. What may interact with this medicine?  medicines to increase blood counts like filgrastim, pegfilgrastim, sargramostim  some other chemotherapy drugs like cisplatin  vaccines Talk to your doctor or health care professional before taking any of these medicines:  acetaminophen  aspirin  ibuprofen  ketoprofen  naproxen This list may not describe all possible interactions. Give your health care provider a list of all the medicines, herbs, non-prescription drugs, or dietary supplements you use. Also tell them if you smoke, drink alcohol, or use illegal drugs. Some items may interact with your medicine. What should I watch for while using this medicine? Visit your doctor for checks on your progress. This drug may make you feel generally unwell. This is not uncommon, as chemotherapy can affect healthy cells as well as cancer cells. Report any side effects. Continue your course of treatment even though you feel ill unless your doctor tells you to stop. In some cases, you may be given additional medicines to help with side effects. Follow all directions for their use. Call  your doctor or health care professional for advice if you get a fever, chills or sore throat, or other symptoms of a cold or flu. Do not treat yourself. This drug decreases your body's ability to fight infections. Try to avoid being around people who  are sick. This medicine may increase your risk to bruise or bleed. Call your doctor or health care professional if you notice any unusual bleeding. Be careful brushing and flossing your teeth or using a toothpick because you may get an infection or bleed more easily. If you have any dental work done, tell your dentist you are receiving this medicine. Avoid taking products that contain aspirin, acetaminophen, ibuprofen, naproxen, or ketoprofen unless instructed by your doctor. These medicines may hide a fever. Do not become pregnant while taking this medicine or for 6 months after stopping it. Women should inform their doctor if they wish to become pregnant or think they might be pregnant. Men should not father a child while taking this medicine and for 3 months after stopping it. There is a potential for serious side effects to an unborn child. Talk to your health care professional or pharmacist for more information. Do not breast-feed an infant while taking this medicine or for at least 1 week after stopping it. Men should inform their doctors if they wish to father a child. This medicine may lower sperm counts. Talk with your doctor or health care professional if you are concerned about your fertility. What side effects may I notice from receiving this medicine? Side effects that you should report to your doctor or health care professional as soon as possible:  allergic reactions like skin rash, itching or hives, swelling of the face, lips, or tongue  breathing problems  pain, redness, or irritation at site where injected  signs and symptoms of a dangerous change in heartbeat or heart rhythm like chest pain; dizziness; fast or irregular heartbeat; palpitations; feeling faint or lightheaded, falls; breathing problems  signs of decreased platelets or bleeding - bruising, pinpoint red spots on the skin, black, tarry stools, blood in the urine  signs of decreased red blood cells - unusually weak or  tired, feeling faint or lightheaded, falls  signs of infection - fever or chills, cough, sore throat, pain or difficulty passing urine  signs and symptoms of kidney injury like trouble passing urine or change in the amount of urine  signs and symptoms of liver injury like dark yellow or brown urine; general ill feeling or flu-like symptoms; light-colored stools; loss of appetite; nausea; right upper belly pain; unusually weak or tired; yellowing of the eyes or skin  swelling of ankles, feet, hands Side effects that usually do not require medical attention (report to your doctor or health care professional if they continue or are bothersome):  constipation  diarrhea  hair loss  loss of appetite  nausea  rash  vomiting This list may not describe all possible side effects. Call your doctor for medical advice about side effects. You may report side effects to FDA at 1-800-FDA-1088. Where should I keep my medicine? This drug is given in a hospital or clinic and will not be stored at home. NOTE: This sheet is a summary. It may not cover all possible information. If you have questions about this medicine, talk to your doctor, pharmacist, or health care provider.  2020 Elsevier/Gold Standard (2017-09-25 18:06:11)

## 2020-04-26 NOTE — Patient Instructions (Signed)

## 2020-04-30 NOTE — Progress Notes (Signed)
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 The patient presented today with concerning findings for disease recurrence and progression with enlarging and hypermetabolic right upper lobe Walker mass presenting as stage IIIa (T3, N0, M0).  PDL 1 expression 0%.  PRIOR THERAPY: 1) Curative stereotactic radiotherapy completed on June 21, 2017. 2) A course of concurrent chemoradiation with chemotherapy consisting of carboplatin for an AUC of 2 and paclitaxel 45 mg/m.First dose started on 06/23/2018.Status post 7 cycles. Last dose of chemotherapy was given August 04, 2018. 3)SBRT to the enlarging left lower lobe pulmonary nodule under the care of Dr. Sondra Come. Last radiation treatment scheduled for 03/26/2019 4)Consolidation immunotherapy with Imfinzi 10 mg/KG every 2 weeks. Last dose given on2/22/21. Status post26cycles. 5) Systemic chemotherapy with carboplatin for an AUC of 5, Taxol 175 mg/m2, and Keytruda 200 mg IV every 3 weeks with Neulasta. Status post7cycles. Last dose on 03/29/20. Starting from cycle #5, she has been on maintenance immunotherapy with Keytruda.  CURRENT THERAPY: Palliative systemic chemotherapy with gemcitabine 1000 mg/m2 first dose expected on 04/26/2020.   INTERVAL HISTORY: [name] 79 y.o. female returns to the clinic today for a follow up visit. The patient is feeling fairly well today without any concerning complaints. The patient recently had evidence for disease progression and her treatment was subsequently switched to chemotherapy with gemzar. She is status post day 1 of cycle #1 and she tolerated it well without any appreciable side effects. She denies  any fever, chills, or night sweats. It looks like she lost about 3lbs since her last visit. She does not like the taste of the supplemental drinks such as boost/ensure. She denies any chest pain, shortness of breath, or hemoptysis.  She reports her baseline mild dry cough. She does continue to smoke a few cigarettes.  She denies any nausea, vomiting, diarrhea, or constipation.  She denies any headache or visual changes. She is here for evaluation before starting day 8 of cycle #1.    MEDICAL HISTORY: Past Medical History:  Diagnosis Date   Carotid artery occlusion    Clostridium difficile infection 06/2013   Diabetes mellitus    takes Janumet daily   Dyslipidemia    takes Crestor daily   Gallstones    GERD (gastroesophageal reflux disease)    Heart murmur    hx of   History of radiation therapy 06/11/17-06/21/17   right Walker 50 Gy in 5 fractions   Hypertension    takes Prinzide and Verapamil daily   Impaired speech    from stroke   Neuropathy, diabetic ([place])    nscl ca dx'd 2018   Walker    Pneumonia    PONV (postoperative nausea and vomiting)    Seizures ([place]) 04/19/2018   recent hospitalization   Smoker    Stroke ([place]) 06/25/13   Vertigo    but doesn't take any meds    ALLERGIES:  is allergic to codeine, benadryl [diphenhydramine], lipitor [atorvastatin], and phenergan [promethazine hcl].  MEDICATIONS:  Current Outpatient Medications  Medication Sig Dispense Refill   aspirin EC 81 MG tablet Take 81 mg by mouth daily.     Blood Glucose Monitoring Suppl (ONE TOUCH ULTRA 2) w/Device KIT 1 Device by Does not apply route 2 (two) times daily. 1  each 0   Cholecalciferol (VITAMIN D) 50 MCG (2000 UT) tablet Take 2,000 Units by mouth daily.     Cyanocobalamin (B-12 COMPLIANCE INJECTION IJ) Inject 1 Dose as directed every 30 (thirty) days.     divalproex (DEPAKOTE ER) 500 MG 24 hr tablet Take one tablet  twice daily (Patient taking differently: Take 500 mg by  mouth 2 (two) times daily. ) 60 tablet 6   glucose blood (ONE TOUCH ULTRA TEST) test strip Use as instructed 100 each 12   JANUMET XR 50-1000 MG TB24 TAKE 1 TABLET BY MOUTH DAILY (Patient taking differently: Take 1 tablet by mouth daily. ) 60 tablet 5   Lancets (ONETOUCH ULTRASOFT) lancets Use as instructed 100 each 12   lidocaine-prilocaine (EMLA) cream APPLY TO THE AFFECTED AREA DAILY AS NEEDED 30 g 0   lisinopril (ZESTRIL) 10 MG tablet TAKE 1 TABLET(10 MG) BY MOUTH DAILY (Patient taking differently: Take 10 mg by mouth daily. ) 90 tablet 3   Multiple Vitamin (MULTIVITAMIN WITH MINERALS) TABS tablet Take 1 tablet by mouth daily.     pioglitazone (ACTOS) 30 MG tablet TAKE 1 TABLET(30 MG) BY MOUTH DAILY (Patient taking differently: Take 30 mg by mouth daily. ) 90 tablet 1   prochlorperazine (COMPAZINE) 10 MG tablet Take 1 tablet (10 mg total) by mouth every 6 (six) hours as needed. 30 tablet 2   rosuvastatin (CRESTOR) 20 MG tablet TAKE 1 TABLET BY MOUTH ONCE DAILY (Patient taking differently: Take 20 mg by mouth daily. ) 180 tablet 0   vitamin B-12 (CYANOCOBALAMIN) 100 MCG tablet Take 100 mcg by mouth daily.     Current Facility-Administered Medications  Medication Dose Route Frequency Provider Last Rate Last Admin   cyanocobalamin ((VITAMIN B-12)) injection 1,000 mcg  1,000 mcg Intramuscular Once Nichole Lung, MD       cyanocobalamin ((VITAMIN B-12)) injection 1,000 mcg  1,000 mcg Intramuscular Once Nichole Lung, MD       Facility-Administered Medications Ordered in Other Visits  Medication Dose Route Frequency Provider Last Rate Last Admin   heparin lock flush 100 unit/mL  500 Units Intracatheter Once PRN Curt Bears, MD       sodium chloride flush (NS) 0.9 % injection 10 mL  10 mL Intracatheter PRN Curt Bears, MD        SURGICAL HISTORY:  Past Surgical History:  Procedure Laterality Date   cataract removed Right    CHOLECYSTECTOMY     COLONOSCOPY      ENDARTERECTOMY Left 07/14/2013   Procedure: ENDARTERECTOMY CAROTID-LEFT;  Surgeon: Elam Dutch, MD;  Location: [facility];  Service: Vascular;  Laterality: Left;   EYE SURGERY Left    cataract   IR CV LINE INJECTION  05/13/2019   IR CV LINE INJECTION  03/11/2020   IR IMAGING GUIDED PORT INSERTION  09/15/2018   IR PATIENT EVAL TECH 0-60 MINS  05/08/2019   IR PERC PLEURAL DRAIN W/INDWELL CATH W/IMG GUIDE  05/23/2017   KNEE SURGERY Left 32yr ago   LAPAROSCOPIC PARTIAL COLECTOMY N/A 09/02/2015   Procedure: LAPAROSCOPIC PARTIAL RIGHT COLECTOMY;  Surgeon: ALeighton Ruff MD;  Location: [facility];  Service: General;  Laterality: N/A;   PATCH ANGIOPLASTY Left 07/14/2013   Procedure: LEFT CAROTID ARTERY PATCH ANGIOPLASTY;  Surgeon: CElam Dutch MD;  Location: [facility]  Service: Vascular;  Laterality: Left;   VIDEO BRONCHOSCOPY N/A 06/04/2018   Procedure: VIDEO BRONCHOSCOPY;  Surgeon: HMelrose Nakayama MD;  Location[facility]  Service: Thoracic;  Laterality: N/A;   wisdom      REVIEW OF SYSTEMS:   Constitutional: Positive for 2 lb weight loss and decreased appetite. Negative for chills, fatigue, and fever.  HENT: Negative for mouth sores, nosebleeds, sore throat and trouble swallowing.   Eyes: Negative for eye problems and icterus.  Respiratory: Positive for mild baseline dry cough. Negative for hemoptysis, shortness of breath and wheezing.   Cardiovascular: Negative for chest pain and leg swelling.  Gastrointestinal: Negative for abdominal pain, constipation, diarrhea, nausea and vomiting.  Genitourinary: Negative for bladder incontinence, difficulty urinating, dysuria, frequency and hematuria.   Musculoskeletal: Negative for back pain, gait problem, neck pain and neck stiffness.  Skin: Negative for itching and rash.  Neurological:  Negative for extremity weakness, dizziness, gait problem, headaches, light-headedness and seizures.  Hematological: Negative for adenopathy. Does not  bruise/bleed easily.  Psychiatric/Behavioral: Negative for confusion, depression and sleep disturbance. The patient is not nervous/anxious.    PHYSICAL EXAMINATION:  There were no vitals taken for this visit.  ECOG PERFORMANCE STATUS: 1 - Symptomatic but completely ambulatory  Physical Exam  Constitutional: Oriented to person, place, and time and elderly appearing female and in no distress.  HENT:  Head: Normocephalic and atraumatic.  Mouth/Throat: Oropharynx is clear and moist. No oropharyngeal exudate.  Eyes: Conjunctivae are normal. Right eye exhibits no discharge. Left eye exhibits no discharge. No scleral icterus.  Neck: Normal range of motion. Neck supple.  Cardiovascular: Normal rate, regular rhythm, normal heart sounds and intact distal pulses.   Pulmonary/Chest: Effort normal. Decreased breath sounds in right Walker. No respiratory distress. No wheezes. No rales.  Abdominal: Soft. Bowel sounds are normal. Exhibits no distension and no mass. There is no tenderness.  Musculoskeletal: Normal range of motion. Exhibits no edema.  Lymphadenopathy:    No cervical adenopathy.  Neurological: Alert and oriented to person, place, and time. Exhibits muscle wasting. Examined in the wheelchair.  Skin: Skin is warm and dry. No rash noted. Not diaphoretic. No erythema. No pallor.  Psychiatric: Mood, memory and judgment normal.  Vitals reviewed.  LABORATORY DATA: Lab Results  Component Value Date   WBC 10.9 (H) 04/26/2020   HGB 9.4 (L) 04/26/2020   HCT 29.5 (L) 04/26/2020   MCV 86.3 04/26/2020   PLT 522 (H) 04/26/2020      Chemistry      Component Value Date/Time   NA 138 04/26/2020 0832   NA 139 05/07/2018 1147   K 3.9 04/26/2020 0832   CL 101 04/26/2020 0832   CO2 31 04/26/2020 0832   BUN 16 04/26/2020 0832   BUN 15 05/07/2018 1147   CREATININE 0.88 04/26/2020 0832   CREATININE 0.87 03/08/2015 0001      Component Value Date/Time   CALCIUM 11.7 (H) 04/26/2020 0832   ALKPHOS  80 04/26/2020 0832   AST 10 (L) 04/26/2020 0832   ALT 7 04/26/2020 0832   BILITOT 0.2 (L) 04/26/2020 0832       RADIOGRAPHIC STUDIES:  CT Chest W Contrast  Result Date: 04/08/2020 CLINICAL DATA:  Non-small cell Walker cancer, assess treatment response EXAM: CT CHEST WITH CONTRAST TECHNIQUE: Multidetector CT imaging of the chest was performed during intravenous contrast administration. CONTRAST:  67m OMNIPAQUE IOHEXOL 300 MG/ML  SOLN COMPARISON:  January 26, 2020 FINDINGS: Cardiovascular: RIGHT-sided Port-A-Cath in place terminating in the distal superior vena cava. Three-vessel coronary artery disease with similar appearance to prior study. Heart size is stable without pericardial effusion. Generalize calcified and noncalcified atheromatous plaque of the thoracic  aorta. Central pulmonary vasculature is normal on venous phase assessment. Mediastinum/Nodes: Increased size of by hilar nodal enlargement. The RIGHT juxta hilar nodal mass (image 57, series 2) 2.9 x 2.3 cm previously approximately 2.5 cm when combined dimensions of 2 adjacent lymph nodes are some. This is contiguous with RIGHT upper lobe mass, see below. LEFT infrahilar mass and or nodal involvement measuring 2.7 x 3.6 cm as compared to 2.3 x 3.0 cm. This involves the superior segment of the LEFT lower lobe extending from the LEFT infrahilar region. Extract superiorly along the fissure distorting the major fissure. No enlarged lymph nodes elsewhere in the chest. Lungs/Pleura: Enlarging RIGHT upper lobe mass like area amidst post treatment changes showing irregular internal enhancement. Area measuring 6.3 x 4.4 cm as compared to 3.9 x 5.2 cm as measured on image 35 of series 2 Nodularity along the RIGHT hemidiaphragm, along the RIGHT liver margin. Pleural effusion with thickened "rind" and adjacent areas of rounded basilar consolidative change. These areas show no interval change since the previous study. Areas in the inferior RIGHT pleural space  with nodularity (image 122 series 2, 1.5 cm area of RIGHT costodiaphragmatic nodularity at the inferior margin of the pleural space grossly similar to the prior study. Upper Abdomen: Nodular contour along the liver margin favored to represent diaphragmatic involvement on image 118 of series 2. The imaged portions of upper abdominal viscera without acute process. Post cholecystectomy. Nodularity of the LEFT adrenal gland with stable appearance. Pancreas is normal. Musculoskeletal: No acute musculoskeletal process. No destructive bone finding. IMPRESSION: 1. Worsening of disease with enlarging and more heterogeneous appearance of RIGHT upper lobe mass with convex margins and associated RIGHT juxta hilar adenopathy also increased in size. 2. LEFT lower lobe mass and or adenopathy with increased size with similar appearance. 3. RIGHT sided pleural effusion presumably malignant with some areas of subtle nodularity along the RIGHT inferior costodiaphragmatic sulcus laterally. 4. Stable appearance of left adrenal gland nodularity. Attention on follow-up 5. Three-vessel coronary artery disease. 6. Aortic atherosclerosis. Aortic Atherosclerosis (ICD10-I70.0). Electronically Signed   By: Zetta Bills M.D.   On: 04/08/2020 08:26     ASSESSMENT/PLAN:  This is a very pleasant 80 year old Caucasian female with recurrentstage IIIAnon-small cell Walker cancer, squamous cell carcinomaof the right upper lobe. She presented initially as a stage IIa in August of 2018.  The patient underwent a course of concurrent chemoradiation with weekly carboplatin and paclitaxel. She status post 7 cycles. She tolerated treatment well. She then underwent consolidation immunotherapy with Imfinzi 10 mg/kg IV every 2 weeks. She is status post26cycles.  She tolerated it well without any adverse side effects.  She recently completed SBRT under the care of Dr. Sondra Come to the enlarging left pulmonary nodule on 03/26/2019.  She  then showed evidence of disease progression and started on treatment with carboplatin for an AUC of 5, paclitaxel 175 mg/m2 and Keytruda 200 mg IV every 3 weeks. She is status post7cycles.Starting from cycle #5, she had been on maintenance [place]. She showed evidence of disease progression and this was discontinued.   She is currently undergoing chemotherpy with gemcitabine 1000 mg/m2 on days 1 and 8 IV every 3 weeks. She is status post day 1 of cycle #` and she tolerated it well without any noticeable side effects.   Labs were reviewed. Recommend that she proceed with day 8 cycle #1 today as scheduled.   We will see her back for a follow up visit in 2 weeks for evaluation before starting  day 1 of cycle 2.   The patient was advised to call immediately if she has any concerning symptoms in the interval. The patient voices understanding of current disease status and treatment options and is in agreement with the current care plan. All questions were answered. The patient knows to call the clinic with any problems, questions or concerns. We can certainly see the patient much sooner if necessary       No orders of the defined types were placed in this encounter.    Shamaria Kavan L Arlind Klingerman, PA-C 04/30/20

## 2020-05-04 ENCOUNTER — Encounter: Payer: Self-pay | Admitting: Physician Assistant

## 2020-05-04 ENCOUNTER — Inpatient Hospital Stay: Payer: Medicare Other

## 2020-05-04 ENCOUNTER — Other Ambulatory Visit: Payer: Self-pay

## 2020-05-04 ENCOUNTER — Other Ambulatory Visit: Payer: Self-pay | Admitting: Physician Assistant

## 2020-05-04 ENCOUNTER — Inpatient Hospital Stay (HOSPITAL_BASED_OUTPATIENT_CLINIC_OR_DEPARTMENT_OTHER): Payer: Medicare Other | Admitting: Physician Assistant

## 2020-05-04 VITALS — BP 141/67 | HR 81 | Temp 97.1°F | Resp 16 | Ht 66.0 in | Wt 114.7 lb

## 2020-05-04 DIAGNOSIS — Z5112 Encounter for antineoplastic immunotherapy: Secondary | ICD-10-CM | POA: Diagnosis not present

## 2020-05-04 DIAGNOSIS — D6481 Anemia due to antineoplastic chemotherapy: Secondary | ICD-10-CM

## 2020-05-04 DIAGNOSIS — Z7984 Long term (current) use of oral hypoglycemic drugs: Secondary | ICD-10-CM | POA: Diagnosis not present

## 2020-05-04 DIAGNOSIS — Z923 Personal history of irradiation: Secondary | ICD-10-CM | POA: Diagnosis not present

## 2020-05-04 DIAGNOSIS — Z79899 Other long term (current) drug therapy: Secondary | ICD-10-CM | POA: Diagnosis not present

## 2020-05-04 DIAGNOSIS — T451X5A Adverse effect of antineoplastic and immunosuppressive drugs, initial encounter: Secondary | ICD-10-CM

## 2020-05-04 DIAGNOSIS — Z95828 Presence of other vascular implants and grafts: Secondary | ICD-10-CM

## 2020-05-04 DIAGNOSIS — I2510 Atherosclerotic heart disease of native coronary artery without angina pectoris: Secondary | ICD-10-CM | POA: Diagnosis not present

## 2020-05-04 DIAGNOSIS — C3491 Malignant neoplasm of unspecified part of right bronchus or lung: Secondary | ICD-10-CM

## 2020-05-04 DIAGNOSIS — F17210 Nicotine dependence, cigarettes, uncomplicated: Secondary | ICD-10-CM | POA: Diagnosis not present

## 2020-05-04 DIAGNOSIS — E119 Type 2 diabetes mellitus without complications: Secondary | ICD-10-CM | POA: Diagnosis not present

## 2020-05-04 DIAGNOSIS — Z5111 Encounter for antineoplastic chemotherapy: Secondary | ICD-10-CM | POA: Diagnosis not present

## 2020-05-04 DIAGNOSIS — D531 Other megaloblastic anemias, not elsewhere classified: Secondary | ICD-10-CM

## 2020-05-04 DIAGNOSIS — Z7982 Long term (current) use of aspirin: Secondary | ICD-10-CM | POA: Diagnosis not present

## 2020-05-04 DIAGNOSIS — Z9049 Acquired absence of other specified parts of digestive tract: Secondary | ICD-10-CM | POA: Diagnosis not present

## 2020-05-04 DIAGNOSIS — I10 Essential (primary) hypertension: Secondary | ICD-10-CM | POA: Diagnosis not present

## 2020-05-04 DIAGNOSIS — K219 Gastro-esophageal reflux disease without esophagitis: Secondary | ICD-10-CM | POA: Diagnosis not present

## 2020-05-04 DIAGNOSIS — Z8673 Personal history of transient ischemic attack (TIA), and cerebral infarction without residual deficits: Secondary | ICD-10-CM | POA: Diagnosis not present

## 2020-05-04 DIAGNOSIS — E785 Hyperlipidemia, unspecified: Secondary | ICD-10-CM | POA: Diagnosis not present

## 2020-05-04 DIAGNOSIS — C3411 Malignant neoplasm of upper lobe, right bronchus or lung: Secondary | ICD-10-CM | POA: Diagnosis not present

## 2020-05-04 LAB — CBC WITH DIFFERENTIAL (CANCER CENTER ONLY)
Abs Immature Granulocytes: 0.03 10*3/uL (ref 0.00–0.07)
Basophils Absolute: 0 10*3/uL (ref 0.0–0.1)
Basophils Relative: 0 %
Eosinophils Absolute: 0.1 10*3/uL (ref 0.0–0.5)
Eosinophils Relative: 1 %
HCT: 27.8 % — ABNORMAL LOW (ref 36.0–46.0)
Hemoglobin: 8.9 g/dL — ABNORMAL LOW (ref 12.0–15.0)
Immature Granulocytes: 0 %
Lymphocytes Relative: 16 %
Lymphs Abs: 1.2 10*3/uL (ref 0.7–4.0)
MCH: 27.1 pg (ref 26.0–34.0)
MCHC: 32 g/dL (ref 30.0–36.0)
MCV: 84.8 fL (ref 80.0–100.0)
Monocytes Absolute: 0.6 10*3/uL (ref 0.1–1.0)
Monocytes Relative: 8 %
Neutro Abs: 5.3 10*3/uL (ref 1.7–7.7)
Neutrophils Relative %: 75 %
Platelet Count: 303 10*3/uL (ref 150–400)
RBC: 3.28 MIL/uL — ABNORMAL LOW (ref 3.87–5.11)
RDW: 14.8 % (ref 11.5–15.5)
WBC Count: 7.2 10*3/uL (ref 4.0–10.5)
nRBC: 0 % (ref 0.0–0.2)

## 2020-05-04 LAB — CMP (CANCER CENTER ONLY)
ALT: 6 U/L (ref 0–44)
AST: 11 U/L — ABNORMAL LOW (ref 15–41)
Albumin: 2.7 g/dL — ABNORMAL LOW (ref 3.5–5.0)
Alkaline Phosphatase: 82 U/L (ref 38–126)
Anion gap: 7 (ref 5–15)
BUN: 15 mg/dL (ref 8–23)
CO2: 30 mmol/L (ref 22–32)
Calcium: 10.6 mg/dL — ABNORMAL HIGH (ref 8.9–10.3)
Chloride: 101 mmol/L (ref 98–111)
Creatinine: 0.76 mg/dL (ref 0.44–1.00)
GFR, Estimated: >60 mL/min (ref >60)
Glucose, Bld: 126 mg/dL — ABNORMAL HIGH (ref 70–99)
Potassium: 4.1 mmol/L (ref 3.5–5.1)
Sodium: 138 mmol/L (ref 135–145)
Total Bilirubin: <0.2 mg/dL — ABNORMAL LOW (ref 0.3–1.2)
Total Protein: 6.8 g/dL (ref 6.5–8.1)

## 2020-05-04 MED ORDER — SODIUM CHLORIDE 0.9 % IV SOLN
Freq: Once | INTRAVENOUS | Status: AC
  Filled 2020-05-04: qty 250

## 2020-05-04 MED ORDER — ALTEPLASE 2 MG IJ SOLR
INTRAMUSCULAR | Status: AC
  Filled 2020-05-04: qty 2

## 2020-05-04 MED ORDER — SODIUM CHLORIDE 0.9 % IV SOLN
1000.0000 mg/m2 | Freq: Once | INTRAVENOUS | Status: AC
  Administered 2020-05-04: 1558 mg via INTRAVENOUS
  Filled 2020-05-04: qty 40.98

## 2020-05-04 MED ORDER — SODIUM CHLORIDE 0.9% FLUSH
10.0000 mL | INTRAVENOUS | Status: DC | PRN
  Administered 2020-05-04: 10 mL
  Filled 2020-05-04: qty 10

## 2020-05-04 MED ORDER — ALTEPLASE 2 MG IJ SOLR
2.0000 mg | Freq: Once | INTRAMUSCULAR | Status: AC | PRN
  Administered 2020-05-04: 2 mg
  Filled 2020-05-04: qty 2

## 2020-05-04 MED ORDER — HEPARIN SOD (PORK) LOCK FLUSH 100 UNIT/ML IV SOLN
500.0000 [IU] | Freq: Once | INTRAVENOUS | Status: AC | PRN
  Administered 2020-05-04: 500 [IU]
  Filled 2020-05-04: qty 5

## 2020-05-04 MED ORDER — PROCHLORPERAZINE MALEATE 10 MG PO TABS
ORAL_TABLET | ORAL | Status: AC
  Filled 2020-05-04: qty 1

## 2020-05-04 MED ORDER — PROCHLORPERAZINE MALEATE 10 MG PO TABS
10.0000 mg | ORAL_TABLET | Freq: Once | ORAL | Status: AC
  Administered 2020-05-04: 10 mg via ORAL

## 2020-05-04 NOTE — Patient Instructions (Signed)
[facility] °Discharge Instructions for Patients Receiving Chemotherapy ° °Today you received the following chemotherapy agents Gemzar ° °To help prevent nausea and vomiting after your treatment, we encourage you to take your nausea medication as directed. °  °If you develop nausea and vomiting that is not controlled by your nausea medication, call the clinic.  ° °BELOW ARE SYMPTOMS THAT SHOULD BE REPORTED IMMEDIATELY: °· *FEVER GREATER THAN 100.5 F °· *CHILLS WITH OR WITHOUT FEVER °· NAUSEA AND VOMITING THAT IS NOT CONTROLLED WITH YOUR NAUSEA MEDICATION °· *UNUSUAL SHORTNESS OF BREATH °· *UNUSUAL BRUISING OR BLEEDING °· TENDERNESS IN MOUTH AND THROAT WITH OR WITHOUT PRESENCE OF ULCERS °· *URINARY PROBLEMS °· *BOWEL PROBLEMS °· UNUSUAL RASH °Items with * indicate a potential emergency and should be followed up as soon as possible. ° °Feel free to call the clinic should you have any questions or concerns. The clinic phone number is [phone]. ° °Please show the CHEMO ALERT CARD at check-in to the Emergency Department and triage nurse. ° ° °

## 2020-05-10 ENCOUNTER — Other Ambulatory Visit: Payer: Self-pay | Admitting: Family Medicine

## 2020-05-10 DIAGNOSIS — E1169 Type 2 diabetes mellitus with other specified complication: Secondary | ICD-10-CM

## 2020-05-10 DIAGNOSIS — E785 Hyperlipidemia, unspecified: Secondary | ICD-10-CM

## 2020-05-11 ENCOUNTER — Other Ambulatory Visit (INDEPENDENT_AMBULATORY_CARE_PROVIDER_SITE_OTHER): Payer: Medicare Other

## 2020-05-11 ENCOUNTER — Other Ambulatory Visit: Payer: Self-pay

## 2020-05-11 DIAGNOSIS — E538 Deficiency of other specified B group vitamins: Secondary | ICD-10-CM

## 2020-05-11 MED ORDER — CYANOCOBALAMIN 1000 MCG/ML IJ SOLN
1000.0000 ug | Freq: Once | INTRAMUSCULAR | Status: AC
  Administered 2020-05-11: 1000 ug via INTRAMUSCULAR

## 2020-05-17 ENCOUNTER — Inpatient Hospital Stay: Payer: Medicare Other

## 2020-05-17 ENCOUNTER — Inpatient Hospital Stay (HOSPITAL_BASED_OUTPATIENT_CLINIC_OR_DEPARTMENT_OTHER): Payer: Medicare Other | Admitting: Internal Medicine

## 2020-05-17 ENCOUNTER — Inpatient Hospital Stay: Payer: Medicare Other | Attending: Internal Medicine

## 2020-05-17 ENCOUNTER — Other Ambulatory Visit: Payer: Self-pay

## 2020-05-17 ENCOUNTER — Other Ambulatory Visit: Payer: Self-pay | Admitting: Medical Oncology

## 2020-05-17 ENCOUNTER — Encounter: Payer: Self-pay | Admitting: Internal Medicine

## 2020-05-17 VITALS — BP 152/62 | HR 95 | Temp 97.3°F | Resp 18 | Ht 66.0 in | Wt 113.9 lb

## 2020-05-17 DIAGNOSIS — Z79899 Other long term (current) drug therapy: Secondary | ICD-10-CM | POA: Insufficient documentation

## 2020-05-17 DIAGNOSIS — Z5112 Encounter for antineoplastic immunotherapy: Secondary | ICD-10-CM | POA: Diagnosis present

## 2020-05-17 DIAGNOSIS — Z95828 Presence of other vascular implants and grafts: Secondary | ICD-10-CM

## 2020-05-17 DIAGNOSIS — Z7982 Long term (current) use of aspirin: Secondary | ICD-10-CM | POA: Diagnosis not present

## 2020-05-17 DIAGNOSIS — C3411 Malignant neoplasm of upper lobe, right bronchus or lung: Secondary | ICD-10-CM | POA: Insufficient documentation

## 2020-05-17 DIAGNOSIS — Z923 Personal history of irradiation: Secondary | ICD-10-CM | POA: Insufficient documentation

## 2020-05-17 DIAGNOSIS — F17210 Nicotine dependence, cigarettes, uncomplicated: Secondary | ICD-10-CM | POA: Insufficient documentation

## 2020-05-17 DIAGNOSIS — C3491 Malignant neoplasm of unspecified part of right bronchus or lung: Secondary | ICD-10-CM

## 2020-05-17 DIAGNOSIS — E1140 Type 2 diabetes mellitus with diabetic neuropathy, unspecified: Secondary | ICD-10-CM | POA: Insufficient documentation

## 2020-05-17 DIAGNOSIS — I10 Essential (primary) hypertension: Secondary | ICD-10-CM | POA: Diagnosis not present

## 2020-05-17 DIAGNOSIS — Z5111 Encounter for antineoplastic chemotherapy: Secondary | ICD-10-CM | POA: Insufficient documentation

## 2020-05-17 DIAGNOSIS — D531 Other megaloblastic anemias, not elsewhere classified: Secondary | ICD-10-CM

## 2020-05-17 DIAGNOSIS — D6481 Anemia due to antineoplastic chemotherapy: Secondary | ICD-10-CM

## 2020-05-17 DIAGNOSIS — Z794 Long term (current) use of insulin: Secondary | ICD-10-CM | POA: Insufficient documentation

## 2020-05-17 DIAGNOSIS — K219 Gastro-esophageal reflux disease without esophagitis: Secondary | ICD-10-CM | POA: Diagnosis not present

## 2020-05-17 DIAGNOSIS — Z8673 Personal history of transient ischemic attack (TIA), and cerebral infarction without residual deficits: Secondary | ICD-10-CM | POA: Insufficient documentation

## 2020-05-17 DIAGNOSIS — R110 Nausea: Secondary | ICD-10-CM | POA: Insufficient documentation

## 2020-05-17 LAB — CBC WITH DIFFERENTIAL (CANCER CENTER ONLY)
Abs Immature Granulocytes: 0.05 10*3/uL (ref 0.00–0.07)
Basophils Absolute: 0 10*3/uL (ref 0.0–0.1)
Basophils Relative: 0 %
Eosinophils Absolute: 0.2 10*3/uL (ref 0.0–0.5)
Eosinophils Relative: 2 %
HCT: 29.7 % — ABNORMAL LOW (ref 36.0–46.0)
Hemoglobin: 9.4 g/dL — ABNORMAL LOW (ref 12.0–15.0)
Immature Granulocytes: 1 %
Lymphocytes Relative: 12 %
Lymphs Abs: 1 10*3/uL (ref 0.7–4.0)
MCH: 26.9 pg (ref 26.0–34.0)
MCHC: 31.6 g/dL (ref 30.0–36.0)
MCV: 84.9 fL (ref 80.0–100.0)
Monocytes Absolute: 1.1 10*3/uL — ABNORMAL HIGH (ref 0.1–1.0)
Monocytes Relative: 13 %
Neutro Abs: 6.1 10*3/uL (ref 1.7–7.7)
Neutrophils Relative %: 72 %
Platelet Count: 530 10*3/uL — ABNORMAL HIGH (ref 150–400)
RBC: 3.5 MIL/uL — ABNORMAL LOW (ref 3.87–5.11)
RDW: 15.8 % — ABNORMAL HIGH (ref 11.5–15.5)
WBC Count: 8.4 10*3/uL (ref 4.0–10.5)
nRBC: 0 % (ref 0.0–0.2)

## 2020-05-17 LAB — CMP (CANCER CENTER ONLY)
ALT: <6 U/L (ref 0–44)
AST: 11 U/L — ABNORMAL LOW (ref 15–41)
Albumin: 2.8 g/dL — ABNORMAL LOW (ref 3.5–5.0)
Alkaline Phosphatase: 77 U/L (ref 38–126)
Anion gap: 8 (ref 5–15)
BUN: 14 mg/dL (ref 8–23)
CO2: 29 mmol/L (ref 22–32)
Calcium: 10 mg/dL (ref 8.9–10.3)
Chloride: 103 mmol/L (ref 98–111)
Creatinine: 0.78 mg/dL (ref 0.44–1.00)
GFR, Estimated: >60 mL/min (ref >60)
Glucose, Bld: 141 mg/dL — ABNORMAL HIGH (ref 70–99)
Potassium: 4.3 mmol/L (ref 3.5–5.1)
Sodium: 140 mmol/L (ref 135–145)
Total Bilirubin: 0.3 mg/dL (ref 0.3–1.2)
Total Protein: 6.9 g/dL (ref 6.5–8.1)

## 2020-05-17 LAB — SAMPLE TO BLOOD BANK

## 2020-05-17 MED ORDER — HEPARIN SOD (PORK) LOCK FLUSH 100 UNIT/ML IV SOLN
500.0000 [IU] | Freq: Once | INTRAVENOUS | Status: AC | PRN
  Administered 2020-05-17: 500 [IU]
  Filled 2020-05-17: qty 5

## 2020-05-17 MED ORDER — ALTEPLASE 2 MG IJ SOLR
2.0000 mg | Freq: Once | INTRAMUSCULAR | Status: AC | PRN
  Administered 2020-05-17: 2 mg
  Filled 2020-05-17: qty 2

## 2020-05-17 MED ORDER — SODIUM CHLORIDE 0.9% FLUSH
10.0000 mL | INTRAVENOUS | Status: DC | PRN
  Administered 2020-05-17: 10 mL
  Filled 2020-05-17: qty 10

## 2020-05-17 MED ORDER — ALTEPLASE 2 MG IJ SOLR
INTRAMUSCULAR | Status: AC
  Filled 2020-05-17: qty 2

## 2020-05-17 NOTE — Progress Notes (Signed)
Upon one hour check following instillation of cath-flo, patient's port still without blood return. Dr. Julien Nordmann made aware and patient's treatment canceled for the day. Patient to receive dye study to be scheduled at a later time. Patient and family made aware. Both verbalized an understanding and agreement to the treatment.

## 2020-05-17 NOTE — Progress Notes (Signed)
No blood return from port.  Cathflo administered by [employer] drawn peripherally

## 2020-05-17 NOTE — Patient Instructions (Signed)
Thank you for choosing [facility] to provide your oncology and hematology care.   Should you have questions after your visit to the [facility]), please contact this office at [phone] between 8:30 AM and 4:30 PM.  Voice mails left after 4:00 PM may not be returned until the following business day.  Calls received after 4:30 PM will be answered by an off-site Nurse Triage Line.    Prescription Refills:  Please have your pharmacy contact us directly for most prescription requests.  Contact the office directly for refills of narcotics (pain medications). Allow 48-72 hours for refills.  Appointments: Please contact the [facility] scheduling department [phone] for questions regarding [facility] appointment scheduling.  Contact the schedulers with any scheduling changes so that your appointment can be rescheduled in a timely manner.   Central Scheduling for [facility] [phone] - Call to schedule procedures such as PET scans, CT scans, MRI, Ultrasound, etc.  To afford each patient quality time with our providers, please arrive 30 minutes before your scheduled appointment time.  If you arrive late for your appointment, you may be asked to reschedule.  We strive to give you quality time with our providers, and arriving late affects you and other patients whose appointments are after yours. If you are a no show for multiple scheduled visits, you may be dismissed from the clinic at the providers discretion.     Resources: [place] [employer] [phone] for additional information on assistance programs or assistance connecting with community support programs   [place]  [phone]: Information regarding food stamps, Medicaid, and utility assistance [employer]'s shared-ride transportation service for eligible riders who have a disability that prevents them from riding the fixed route bus.   [place] [phone] Helps people with Medicare understand their rights and benefits, navigate the Medicare system, and secure the quality healthcare they deserve American [employer] [phone] Assists patients locate various types of support and financial assistance Cancer Care: 1-800-813-HOPE 651-532-1614) Provides financial assistance, online support groups, medication/co-pay assistance.   Transportation Assistance for appointments at [facility]: [employer] [phone]  Again, thank you for choosing [facility] for your care.      Steps to Quit Smoking Smoking tobacco is the leading cause of preventable death. It can affect almost every organ in the body. Smoking puts you and people around you at risk for many serious, long-lasting (chronic) diseases. Quitting smoking can be hard, but it is one of the best things that you can do for your health. It is never too late to quit. How do I get ready to quit? When you decide to quit smoking, make a plan to help you succeed. Before you quit:  Pick a date to quit. Set a date within the next 2 weeks to give you time to prepare.  Write down the reasons why you are quitting. Keep this list in places where you will see it often.  Tell your family, friends, and co-workers that you are quitting. Their support is important.  Talk with your doctor about the choices that may help you quit.  Find out if your health insurance will pay for these treatments.  Know the people, places, things, and activities that make you want to smoke (triggers). Avoid them. What first steps can I take to quit smoking?  Throw away all cigarettes at home, at work, and in your car.  Throw away the things that  you use when you smoke, such as Scientist, research (medical).  Clean your car. Make sure to empty the ashtray.  Clean your home, including curtains and carpets. What can I do to help me quit smoking? Talk with your doctor about taking medicines  and seeing a counselor at the same time. You are more likely to succeed when you do both.  If you are pregnant or breastfeeding, talk with your doctor about counseling or other ways to quit smoking. Do not take medicine to help you quit smoking unless your doctor tells you to do so. To quit smoking: Quit right away  Quit smoking totally, instead of slowly cutting back on how much you smoke over a period of time.  Go to counseling. You are more likely to quit if you go to counseling sessions regularly. Take medicine You may take medicines to help you quit. Some medicines need a prescription, and some you can buy over-the-counter. Some medicines may contain a drug called nicotine to replace the nicotine in cigarettes. Medicines may:  Help you to stop having the desire to smoke (cravings).  Help to stop the problems that come when you stop smoking (withdrawal symptoms). Your doctor may ask you to use:  Nicotine patches, gum, or lozenges.  Nicotine inhalers or sprays.  Non-nicotine medicine that is taken by mouth. Find resources Find resources and other ways to help you quit smoking and remain smoke-free after you quit. These resources are most helpful when you use them often. They include:  Online chats with a Social worker.  Phone quitlines.  Printed Furniture conservator/restorer.  Support groups or group counseling.  Text messaging programs.  Mobile phone apps. Use apps on your mobile phone or tablet that can help you stick to your quit plan. There are many free apps for mobile phones and tablets as well as websites. Examples include Quit Guide from the [employer] and smokefree.gov  What things can I do to make it easier to quit?   Talk to your family and friends. Ask them to support and encourage you.  Call a phone quitline (1-800-QUIT-NOW), reach out to support groups, or work with a Social worker.  Ask people who smoke to not smoke around you.  Avoid places that make you want to smoke, such  as: ? Bars. ? Parties. ? Smoke-break areas at work.  Spend time with people who do not smoke.  Lower the stress in your life. Stress can make you want to smoke. Try these things to help your stress: ? Getting regular exercise. ? Doing deep-breathing exercises. ? Doing yoga. ? Meditating. ? Doing a body scan. To do this, close your eyes, focus on one area of your body at a time from head to toe. Notice which parts of your body are tense. Try to relax the muscles in those areas. How will I feel when I quit smoking? Day 1 to 3 weeks Within the first 24 hours, you may start to have some problems that come from quitting tobacco. These problems are very bad 2-3 days after you quit, but they do not often last for more than 2-3 weeks. You may get these symptoms:  Mood swings.  Feeling restless, nervous, angry, or annoyed.  Trouble concentrating.  Dizziness.  Strong desire for high-sugar foods and nicotine.  Weight gain.  Trouble pooping (constipation).  Feeling like you may vomit (nausea).  Coughing or a sore throat.  Changes in how the medicines that you take for other issues work in your body.  Depression.  Trouble sleeping (insomnia). Week 3 and afterward After the first 2-3 weeks of quitting, you may start to notice more positive results, such as:  Better sense of smell and taste.  Less coughing and sore throat.  Slower heart rate.  Lower blood pressure.  Clearer skin.  Better breathing.  Fewer sick days. Quitting smoking can be hard. Do not give up if you fail the first time. Some people need to try a few times before they succeed. Do your best to stick to your quit plan, and talk with your doctor if you have any questions or concerns. Summary  Smoking tobacco is the leading cause of preventable death. Quitting smoking can be hard, but it is one of the best things that you can do for your health.  When you decide to quit smoking, make a plan to help you  succeed.  Quit smoking right away, not slowly over a period of time.  When you start quitting, seek help from your doctor, family, or friends. This information is not intended to replace advice given to you by your health care provider. Make sure you discuss any questions you have with your health care provider. Document Revised: 03/27/2019 Document Reviewed: 09/20/2018 Elsevier Patient Education  [place].

## 2020-05-17 NOTE — Progress Notes (Signed)
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 The patient presented today with concerning findings for disease recurrence and progression with enlarging and hypermetabolic right upper lobe Walker mass presenting as stage IIIa (T3, N0, M0).  PDL 1 expression 0%.  PRIOR THERAPY: 1) Curative stereotactic radiotherapy completed on June 21, 2017. 2) A course of concurrent chemoradiation with chemotherapy consisting of carboplatin for an AUC of 2 and paclitaxel 45 mg/m.First dose started on 06/23/2018.Status post 7 cycles. Last dose of chemotherapy was given August 04, 2018. 3)SBRT to the enlarging left lower lobe pulmonary nodule under the care of Dr. Sondra Come. Last radiation treatment scheduled for 03/26/2019 4)Consolidation immunotherapy with Imfinzi 10 mg/KG every 2 weeks. Last dose given on2/22/21. Status post26cycles. 5)Systemic chemotherapy with carboplatin for an AUC of 5, Taxol 175 mg/m2, and Keytruda 200 mg IV every 3 weeks with Neulasta. Status post7cycles. Last dose on 03/29/20. Starting from cycle #5, she has been on maintenance immunotherapy with Keytruda.  CURRENT THERAPY: Palliative systemic chemotherapy with gemcitabine 1000 mg/m5frst dose expected on 04/26/2020.   Status post 1 cycle.  INTERVAL HISTORY: Nichole SAUTTER79y.o. female returns to the clinic today for follow-up visit.  The patient is feeling fine today with no concerning complaints except for mild fatigue and shortness of breath with exertion.  She denied having any chest pain but has mild cough with no hemoptysis.  She denied having any nausea, vomiting,  diarrhea or constipation.  She has no headache or visual changes.  She tolerated the first cycle of her treatment with single agent gemcitabine fairly well.  The patient is here today for evaluation before starting cycle #2.   MEDICAL HISTORY: Past Medical History:  Diagnosis Date  . Carotid artery occlusion   . Clostridium difficile infection 06/2013  . Diabetes mellitus    takes Janumet daily  . Dyslipidemia    takes Crestor daily  . Gallstones   . GERD (gastroesophageal reflux disease)   . Heart murmur    hx of  . History of radiation therapy 06/11/17-06/21/17   right Walker 50 Gy in 5 fractions  . Hypertension    takes Prinzide and Verapamil daily  . Impaired speech    from stroke  . [place]athy, [place]   . nscl ca dx'd 2018   Walker   . Pneumonia   . PONV (postoperative nausea and vomiting)   . [place]   recent hospitalization  . Smoker   . Stroke ([place] 06/25/13  . Vertigo    but doesn't take any meds    ALLERGIES:  is allergic to codeine, benadryl [diphenhydramine], lipitor [atorvastatin], and phenergan [promethazine hcl].  MEDICATIONS:  Current Outpatient Medications  Medication Sig Dispense Refill  . aspirin EC 81 MG tablet Take 81 mg by mouth daily.    . Blood Glucose Monitoring Suppl (ONE TOUCH ULTRA 2) w/Device KIT 1 Device by Does not apply route 2 (two) times daily. 1 each 0  . Cholecalciferol (VITAMIN D) 50 MCG (2000 UT) tablet Take 2,000 Units by mouth daily.    . Cyanocobalamin (B-12 COMPLIANCE INJECTION IJ) Inject 1 Dose as directed every 30 (thirty) days.    . divalproex (DEPAKOTE ER) 500 MG  24 hr tablet Take one tablet  twice daily (Patient taking differently: Take 500 mg by mouth 2 (two) times daily. ) 60 tablet 6  . glucose blood (ONE TOUCH ULTRA TEST) test strip Use as instructed 100 each 12  . JANUMET XR 50-1000 MG TB24 TAKE 1 TABLET BY MOUTH DAILY (Patient taking differently: Take 1 tablet by mouth daily. ) 60 tablet 5  .  Lancets (ONETOUCH ULTRASOFT) lancets Use as instructed 100 each 12  . lidocaine-prilocaine (EMLA) cream APPLY TO THE AFFECTED AREA DAILY AS NEEDED 30 g 0  . lisinopril (ZESTRIL) 10 MG tablet TAKE 1 TABLET(10 MG) BY MOUTH DAILY (Patient taking differently: Take 10 mg by mouth daily. ) 90 tablet 3  . Multiple Vitamin (MULTIVITAMIN WITH MINERALS) TABS tablet Take 1 tablet by mouth daily.    . pioglitazone (ACTOS) 30 MG tablet TAKE 1 TABLET(30 MG) BY MOUTH DAILY 90 tablet 1  . prochlorperazine (COMPAZINE) 10 MG tablet Take 1 tablet (10 mg total) by mouth every 6 (six) hours as needed. 30 tablet 2  . rosuvastatin (CRESTOR) 20 MG tablet TAKE 1 TABLET BY MOUTH ONCE DAILY (Patient taking differently: Take 20 mg by mouth daily. ) 180 tablet 0  . vitamin B-12 (CYANOCOBALAMIN) 100 MCG tablet Take 100 mcg by mouth daily.     Current Facility-Administered Medications  Medication Dose Route Frequency Provider Last Rate Last Admin  . cyanocobalamin ((VITAMIN B-12)) injection 1,000 mcg  1,000 mcg Intramuscular Once Nichole Lung, MD      . cyanocobalamin ((VITAMIN B-12)) injection 1,000 mcg  1,000 mcg Intramuscular Once Nichole Lung, MD       Facility-Administered Medications Ordered in Other Visits  Medication Dose Route Frequency Provider Last Rate Last Admin  . heparin lock flush 100 unit/mL  500 Units Intracatheter Once PRN Curt Bears, MD      . sodium chloride flush (NS) 0.9 % injection 10 mL  10 mL Intracatheter PRN Curt Bears, MD        SURGICAL HISTORY:  Past Surgical History:  Procedure Laterality Date  . cataract removed Right   . CHOLECYSTECTOMY    . COLONOSCOPY    . ENDARTERECTOMY Left 07/14/2013   Procedure: ENDARTERECTOMY CAROTID-LEFT;  Surgeon: Elam Dutch, MD;  Location: [facility];  Service: Vascular;  Laterality: Left;  . EYE SURGERY Left    cataract  . IR CV LINE INJECTION  05/13/2019  . IR CV LINE INJECTION  03/11/2020  . IR IMAGING GUIDED PORT INSERTION  09/15/2018   . IR PATIENT EVAL TECH 0-60 MINS  05/08/2019  . IR PERC PLEURAL DRAIN W/INDWELL CATH W/IMG GUIDE  05/23/2017  . KNEE SURGERY Left 69yr ago  . LAPAROSCOPIC PARTIAL COLECTOMY N/A 09/02/2015   Procedure: LAPAROSCOPIC PARTIAL RIGHT COLECTOMY;  Surgeon: ALeighton Ruff MD;  Location: [facility];  Service: General;  Laterality: N/A;  . PATCH ANGIOPLASTY Left 07/14/2013   Procedure: LEFT CAROTID ARTERY PATCH ANGIOPLASTY;  Surgeon: CElam Dutch MD;  [facility]  Service: Vascular;  Laterality: Left;  .Marland KitchenVIDEO BRONCHOSCOPY N/A 06/04/2018   Procedure: VIDEO BRONCHOSCOPY;  Surgeon: HMelrose Nakayama MD;  [facility];  Service: Thoracic;  Laterality: N/A;  . wisdom      REVIEW OF SYSTEMS:  A comprehensive review of systems was negative except for: Constitutional: positive for fatigue Respiratory: positive for dyspnea on exertion   PHYSICAL EXAMINATION: General appearance: alert, cooperative, fatigued and no distress Head: Normocephalic, without obvious abnormality, atraumatic Neck: no adenopathy, no  JVD, supple, symmetrical, trachea midline and thyroid not enlarged, symmetric, no tenderness/mass/nodules Lymph nodes: Cervical, supraclavicular, and axillary nodes normal. Resp: clear to auscultation bilaterally Back: symmetric, no curvature. ROM normal. No CVA tenderness. Cardio: regular rate and rhythm, S1, S2 normal, no murmur, click, rub or gallop GI: soft, non-tender; bowel sounds normal; no masses,  no organomegaly Extremities: extremities normal, atraumatic, no cyanosis or edema  ECOG PERFORMANCE STATUS: 1 - Symptomatic but completely ambulatory  Blood pressure (!) 152/62, pulse 95, temperature (!) 97.3 F (36.3 C), temperature source Tympanic, resp. rate 18, height '5\' 6"'  (1.676 m), weight 113 lb 14.4 oz (51.7 kg), SpO2 98 %.  LABORATORY DATA: Lab Results  Component Value Date   WBC 8.4 05/17/2020   HGB 9.4 (L) 05/17/2020   HCT 29.7 (L) 05/17/2020   MCV 84.9 05/17/2020   PLT  530 (H) 05/17/2020      Chemistry      Component Value Date/Time   NA 138 05/04/2020 1130   NA 139 05/07/2018 1147   K 4.1 05/04/2020 1130   CL 101 05/04/2020 1130   CO2 30 05/04/2020 1130   BUN 15 05/04/2020 1130   BUN 15 05/07/2018 1147   CREATININE 0.76 05/04/2020 1130   CREATININE 0.87 03/08/2015 0001      Component Value Date/Time   CALCIUM 10.6 (H) 05/04/2020 1130   ALKPHOS 82 05/04/2020 1130   AST 11 (L) 05/04/2020 1130   ALT 6 05/04/2020 1130   BILITOT <0.2 (L) 05/04/2020 1130       RADIOGRAPHIC STUDIES: No results found.  ASSESSMENT AND PLAN: This is a very pleasant 79 years old white female with recurrent non-small cell Walker cancer presented as a stage IIIa involving the right lower lobe. The patient underwent a course of concurrent chemoradiation with weekly carboplatin and paclitaxel status post 7 cycles.  She tolerated this treatment well. The patient completed a course of consolidation immunotherapy with Imfinzi status post 26 cycles.  She tolerated her treatment well with no concerning complaints. The patient was on observation for several months. Imaging studies including repeat CT scan of the chest as well as a PET scan that showed evidence for disease recurrence in the right Walker and metastasis to the left Walker. The patient is started systemic chemotherapy with carboplatin for AUC of 5, paclitaxel 175 mg/M2 and Keytruda 200 mg IV every 3 weeks status post 7 cycles.  Starting from cycle #5 she is on maintenance treatment with single agent Keytruda. Her treatment was discontinued secondary to disease progression. The patient is currently on systemic chemotherapy with single agent gemcitabine 1000 mg/M2 on days 1 and 8 every 3 weeks status post 1 cycle.  She tolerated the first cycle of her treatment fairly well. I recommended for her to proceed with cycle #2 today as planned. I will see her back for follow-up visit in 3 weeks for evaluation before starting cycle  #3. She was advised to call immediately if she has any concerning symptoms in the interval. The patient voices understanding of current disease status and treatment options and is in agreement with the current care plan. All questions were answered. The patient knows to call the clinic with any problems, questions or concerns. We can certainly see the patient much sooner if necessary.  Disclaimer: This note was dictated with voice recognition software. Similar sounding words can inadvertently be transcribed and may not be corrected upon review.

## 2020-05-18 ENCOUNTER — Other Ambulatory Visit: Payer: Self-pay | Admitting: Physician Assistant

## 2020-05-18 ENCOUNTER — Other Ambulatory Visit: Payer: Self-pay | Admitting: Student

## 2020-05-19 ENCOUNTER — Encounter (HOSPITAL_COMMUNITY): Payer: Self-pay

## 2020-05-19 ENCOUNTER — Ambulatory Visit (HOSPITAL_COMMUNITY)
Admission: RE | Admit: 2020-05-19 | Discharge: 2020-05-19 | Disposition: A | Discharge status: Home / Self Care | Payer: Medicare Other | Source: Ambulatory Visit | Attending: Family Medicine | Admitting: Family Medicine

## 2020-05-19 ENCOUNTER — Ambulatory Visit (HOSPITAL_COMMUNITY)
Admission: RE | Admit: 2020-05-19 | Discharge: 2020-05-19 | Disposition: A | Discharge status: Home / Self Care | Payer: Medicare Other | Source: Ambulatory Visit | Attending: Internal Medicine | Admitting: Internal Medicine

## 2020-05-19 ENCOUNTER — Other Ambulatory Visit: Payer: Self-pay

## 2020-05-19 ENCOUNTER — Other Ambulatory Visit: Payer: Self-pay | Admitting: Internal Medicine

## 2020-05-19 DIAGNOSIS — Z888 Allergy status to other drugs, medicaments and biological substances status: Secondary | ICD-10-CM | POA: Insufficient documentation

## 2020-05-19 DIAGNOSIS — E785 Hyperlipidemia, unspecified: Secondary | ICD-10-CM | POA: Diagnosis not present

## 2020-05-19 DIAGNOSIS — T82598A Other mechanical complication of other cardiac and vascular devices and implants, initial encounter: Secondary | ICD-10-CM | POA: Diagnosis not present

## 2020-05-19 DIAGNOSIS — Z7982 Long term (current) use of aspirin: Secondary | ICD-10-CM | POA: Insufficient documentation

## 2020-05-19 DIAGNOSIS — Z885 Allergy status to narcotic agent status: Secondary | ICD-10-CM | POA: Diagnosis not present

## 2020-05-19 DIAGNOSIS — Z95828 Presence of other vascular implants and grafts: Secondary | ICD-10-CM

## 2020-05-19 DIAGNOSIS — E1140 Type 2 diabetes mellitus with diabetic neuropathy, unspecified: Secondary | ICD-10-CM | POA: Diagnosis not present

## 2020-05-19 DIAGNOSIS — K219 Gastro-esophageal reflux disease without esophagitis: Secondary | ICD-10-CM | POA: Insufficient documentation

## 2020-05-19 DIAGNOSIS — Z79899 Other long term (current) drug therapy: Secondary | ICD-10-CM | POA: Diagnosis not present

## 2020-05-19 DIAGNOSIS — I10 Essential (primary) hypertension: Secondary | ICD-10-CM | POA: Diagnosis not present

## 2020-05-19 DIAGNOSIS — Z7984 Long term (current) use of oral hypoglycemic drugs: Secondary | ICD-10-CM | POA: Diagnosis not present

## 2020-05-19 DIAGNOSIS — F17210 Nicotine dependence, cigarettes, uncomplicated: Secondary | ICD-10-CM | POA: Diagnosis not present

## 2020-05-19 DIAGNOSIS — C3490 Malignant neoplasm of unspecified part of unspecified bronchus or lung: Secondary | ICD-10-CM | POA: Insufficient documentation

## 2020-05-19 DIAGNOSIS — Z452 Encounter for adjustment and management of vascular access device: Secondary | ICD-10-CM | POA: Diagnosis not present

## 2020-05-19 HISTORY — PX: IR US GUIDE VASC ACCESS RIGHT: IMG2390

## 2020-05-19 HISTORY — PX: IR REMOVE CV FIBRIN SHEATH: IMG699

## 2020-05-19 LAB — GLUCOSE, CAPILLARY: Glucose-Capillary: 130 mg/dL — ABNORMAL HIGH (ref 70–99)

## 2020-05-19 MED ORDER — MIDAZOLAM HCL 2 MG/2ML IJ SOLN
INTRAMUSCULAR | Status: AC
  Filled 2020-05-19: qty 4

## 2020-05-19 MED ORDER — LIDOCAINE HCL (PF) 1 % IJ SOLN
INTRAMUSCULAR | Status: AC | PRN
  Administered 2020-05-19: 5 mL

## 2020-05-19 MED ORDER — MIDAZOLAM HCL 2 MG/2ML IJ SOLN
INTRAMUSCULAR | Status: AC | PRN
  Administered 2020-05-19: 1 mg via INTRAVENOUS

## 2020-05-19 MED ORDER — IOHEXOL 300 MG/ML  SOLN
50.0000 mL | Freq: Once | INTRAMUSCULAR | Status: AC | PRN
  Administered 2020-05-19: 5 mL via INTRAVENOUS

## 2020-05-19 MED ORDER — LIDOCAINE HCL (PF) 1 % IJ SOLN
INTRAMUSCULAR | Status: AC
  Filled 2020-05-19: qty 30

## 2020-05-19 MED ORDER — FENTANYL CITRATE (PF) 100 MCG/2ML IJ SOLN
INTRAMUSCULAR | Status: AC
  Filled 2020-05-19: qty 2

## 2020-05-19 MED ORDER — FENTANYL CITRATE (PF) 100 MCG/2ML IJ SOLN
INTRAMUSCULAR | Status: AC | PRN
Start: 2020-05-19 — End: 2020-05-19
  Administered 2020-05-19: 50 ug via INTRAVENOUS

## 2020-05-19 MED ORDER — ONDANSETRON HCL 4 MG/2ML IJ SOLN
INTRAMUSCULAR | Status: AC
  Filled 2020-05-19: qty 2

## 2020-05-19 MED ORDER — SODIUM CHLORIDE 0.9 % IV SOLN: INTRAVENOUS | Status: DC

## 2020-05-19 NOTE — H&P (Signed)
Chief Complaint: Patient was seen in consultation today for Port-a-cath malfunction/fibrin sheath stripping.  Referring Physician(s): Mohamed,Mohamed  Supervising Physician: Jacqulynn Cadet  Patient Status: [facility] - Out-pt  History of Present Illness: [name] is a 79 y.o. female with a past medical history of hypertension, dyslipidemia, CVA 2014, vertigo, seizures, pneumonia, lung cancer, GERD, chololithiasis, diabetes mellitus with associated peripheral neuropathy, and current tobacco use. She was unfortunately diagnosed with non-small cell lung cancer in 02/2017. Her cancer is managed by Dr. Julien Nordmann. She had a Port-a-cath placed in IR 09/15/2018 and patient receives systemic chemotherapy as management via this Port-a-cath. She has had difficulties with aspiration of Port-a-cath. She underwent an image-guided Port-a-cath injection in IR 03/11/2020 revealing a small amount of fibrin sheath on tip of Port-a-cath.  IR consulted by Dr. Julien Nordmann for possible image-guided Port-a-cath fibrin sheath stripping with possible Port-a-cath exchange/replacement. Patient awake and alert sitting in bed with no complaints at this time. Denies fever, chills, chest pain, dyspnea, abdominal pain, or headache.   Past Medical History:  Diagnosis Date  . Carotid artery occlusion   . Clostridium difficile infection 06/2013  . Diabetes mellitus    takes Janumet daily  . Dyslipidemia    takes Crestor daily  . Gallstones   . GERD (gastroesophageal reflux disease)   . Heart murmur    hx of  . History of radiation therapy 06/11/17-06/21/17   right lung 50 Gy in 5 fractions  . Hypertension    takes Prinzide and Verapamil daily  . Impaired speech    from stroke  . Neuropathy, diabetic ([place])   . nscl ca dx'd 2018   Lung   . Pneumonia   . PONV (postoperative nausea and vomiting)   . Seizures ([place]) 04/19/2018   recent hospitalization  . Smoker   . Stroke ([place]) 06/25/13  . Vertigo    but doesn't  take any meds    Past Surgical History:  Procedure Laterality Date  . cataract removed Right   . CHOLECYSTECTOMY    . COLONOSCOPY    . ENDARTERECTOMY Left 07/14/2013   Procedure: ENDARTERECTOMY CAROTID-LEFT;  Surgeon: Elam Dutch, MD;  Location: [place];  Service: Vascular;  Laterality: Left;  . EYE SURGERY Left    cataract  . IR CV LINE INJECTION  05/13/2019  . IR CV LINE INJECTION  03/11/2020  . IR IMAGING GUIDED PORT INSERTION  09/15/2018  . IR PATIENT EVAL TECH 0-60 MINS  05/08/2019  . IR PERC PLEURAL DRAIN W/INDWELL CATH W/IMG GUIDE  05/23/2017  . KNEE SURGERY Left 65yr ago  . LAPAROSCOPIC PARTIAL COLECTOMY N/A 09/02/2015   Procedure: LAPAROSCOPIC PARTIAL RIGHT COLECTOMY;  Surgeon: ALeighton Ruff MD;  Location: [facility];  Service: General;  Laterality: N/A;  . PATCH ANGIOPLASTY Left 07/14/2013   Procedure: LEFT CAROTID ARTERY PATCH ANGIOPLASTY;  Surgeon: CElam Dutch MD;  [place] [facility]  Service: Vascular;  Laterality: Left;  .Marland KitchenVIDEO BRONCHOSCOPY N/A 06/04/2018   Procedure: VIDEO BRONCHOSCOPY;  Surgeon: HMelrose Nakayama MD;  [facility];  Service: Thoracic;  Laterality: N/A;  . wisdom      Allergies: Codeine, Benadryl [diphenhydramine], Lipitor [atorvastatin], and Phenergan [promethazine hcl]  Medications: Prior to Admission medications   Medication Sig Start Date End Date Taking? Authorizing Provider  aspirin EC 81 MG tablet Take 81 mg by mouth daily.    [provider]  Blood Glucose Monitoring Suppl (ONE TOUCH ULTRA 2) w/Device KIT 1 Device by Does not apply route 2 (two)  times daily. 08/21/17   [name], MD  Cholecalciferol (VITAMIN D) 50 MCG (2000 UT) tablet Take 2,000 Units by mouth daily.    [provider]  Cyanocobalamin (B-12 COMPLIANCE INJECTION IJ) Inject 1 Dose as directed every 30 (thirty) days.    [provider]  divalproex (DEPAKOTE ER) 500 MG 24 hr tablet Take one tablet  twice daily Patient taking differently:  Take 500 mg by mouth 2 (two) times daily.  08/12/19   Garvin Fila, MD  glucose blood (ONE TOUCH ULTRA TEST) test strip Use as instructed 02/10/18   [name], MD  JANUMET XR 50-1000 MG TB24 TAKE 1 TABLET BY MOUTH DAILY Patient taking differently: Take 1 tablet by mouth daily.  02/04/20   [name], MD  Lancets [facility] ULTRASOFT) lancets Use as instructed 08/21/17   [name], MD  lidocaine-prilocaine (EMLA) cream APPLY TO THE AFFECTED AREA DAILY AS NEEDED 03/30/20   Curt Bears, MD  lisinopril (ZESTRIL) 10 MG tablet TAKE 1 TABLET(10 MG) BY MOUTH DAILY Patient taking differently: Take 10 mg by mouth daily.  05/29/19   [name], MD  Multiple Vitamin (MULTIVITAMIN WITH MINERALS) TABS tablet Take 1 tablet by mouth daily.    [provider]  pioglitazone (ACTOS) 30 MG tablet TAKE 1 TABLET(30 MG) BY MOUTH DAILY 05/10/20   [name], MD  prochlorperazine (COMPAZINE) 10 MG tablet Take 1 tablet (10 mg total) by mouth every 6 (six) hours as needed. 04/26/20   Heilingoetter, Cassandra L, PA-C  rosuvastatin (CRESTOR) 20 MG tablet TAKE 1 TABLET BY MOUTH ONCE DAILY Patient taking differently: Take 20 mg by mouth daily.  01/05/20   [name], MD  vitamin B-12 (CYANOCOBALAMIN) 100 MCG tablet Take 100 mcg by mouth daily.    [provider]     Family History  Problem Relation Age of Onset  . Leukemia Mother   . Diabetes Mother   . Cancer Father        esophageal ca  . Heart disease Father   . Diabetes Maternal Aunt   . Diabetes Maternal Uncle   . Diabetes Brother   . Diabetes Sister   . Heart attack Brother        x 2    Social History   Socioeconomic History  . Marital status: Widowed    Spouse name: Jeneen Rinks  . Number of children: 3  . Years of education: 10  . Highest education level: Not on file  Occupational History  . Occupation: retired  Tobacco Use  . Smoking status: Current Every Day Smoker    Packs/day: 1.50    Years: 60.00     Pack years: 90.00    Types: Cigarettes  . Smokeless tobacco: Never Used  Vaping Use  . Vaping Use: Never used  Substance and Sexual Activity  . Alcohol use: No    Alcohol/week: 0.0 standard drinks  . Drug use: No  . Sexual activity: Yes  Other Topics Concern  . Not on file  Social History Narrative   Patient is married with 3 children.   Patient is right handed.   Patient has 10 th grade education.   Patient drinks 4 cups daily.   Social Determinants of Health   Financial Resource Strain:   . Difficulty of Paying Living Expenses: Not on file  Food Insecurity:   . Worried About Charity fundraiser in the Last Year: Not on file  . Ran Out  of Food in the Last Year: Not on file  Transportation Needs:   . Lack of Transportation (Medical): Not on file  . Lack of Transportation (Non-Medical): Not on file  Physical Activity:   . Days of Exercise per Week: Not on file  . Minutes of Exercise per Session: Not on file  Stress:   . Feeling of Stress : Not on file  Social Connections:   . Frequency of Communication with Friends and Family: Not on file  . Frequency of Social Gatherings with Friends and Family: Not on file  . Attends Religious Services: Not on file  . Active Member of Clubs or Organizations: Not on file  . Attends Archivist Meetings: Not on file  . Marital Status: Not on file     Review of Systems: A 12 point ROS discussed and pertinent positives are indicated in the HPI above.  All other systems are negative.  Review of Systems  Constitutional: Negative for chills and fever.  Respiratory: Negative for shortness of breath and wheezing.   Cardiovascular: Negative for chest pain and palpitations.  Gastrointestinal: Negative for abdominal pain.  Neurological: Negative for headaches.  Psychiatric/Behavioral: Negative for behavioral problems and confusion.    Vital Signs: There were no vitals taken for this visit.  Physical Exam Vitals and nursing  note reviewed.  Constitutional:      General: She is not in acute distress.    Appearance: Normal appearance.  Cardiovascular:     Rate and Rhythm: Normal rate and regular rhythm.     Heart sounds: Normal heart sounds. No murmur heard.   Pulmonary:     Effort: Pulmonary effort is normal. No respiratory distress.     Breath sounds: Normal breath sounds. No wheezing.  Skin:    General: Skin is warm and dry.  Neurological:     Mental Status: She is alert and oriented to person, place, and time.      MD Evaluation Airway: WNL Heart: WNL Abdomen: WNL Chest/ Lungs: WNL ASA  Classification: 3 Mallampati/Airway Score: One   Imaging: No results found.  Labs:  CBC: Recent Labs    04/19/20 0954 04/26/20 0832 05/04/20 1130 05/17/20 0912  WBC 9.6 10.9* 7.2 8.4  HGB 10.1* 9.4* 8.9* 9.4*  HCT 31.6* 29.5* 27.8* 29.7*  PLT 532* 522* 303 530*    COAGS: No results for input(s): INR, APTT in the last 8760 hours.  BMP: Recent Labs    02/16/20 1159 02/16/20 1159 02/23/20 1034 02/23/20 1034 03/08/20 0827 03/08/20 0827 03/29/20 1027 03/29/20 1027 04/19/20 0954 04/26/20 0832 05/04/20 1130 05/17/20 0912  NA 138   < > 137   < > 137   < > 136   < > 134* 138 138 140  K 4.1   < > 4.2   < > 4.3   < > 3.6   < > 4.2 3.9 4.1 4.3  CL 103   < > 101   < > 101   < > 101   < > 100 101 101 103  CO2 26   < > 27   < > 27   < > 30   < > '27 31 30 29  ' GLUCOSE 119*   < > 123*   < > 136*   < > 128*   < > 125* 139* 126* 141*  BUN 12   < > 15   < > 20   < > 16   < > 17 16  15 14  CALCIUM 9.8   < > 10.5*   < > 10.6*   < > 10.0   < > 11.2* 11.7* 10.6* 10.0  CREATININE 0.76   < > 0.77   < > 0.83   < > 0.79   < > 0.82 0.88 0.76 0.78  GFRNONAA >60   < > >60   < > >60   < > >60   < > >60 >60 >60 >60  GFRAA >60  --  >60  --  >60  --  >60  --   --   --   --   --    < > = values in this interval not displayed.    LIVER FUNCTION TESTS: Recent Labs    04/19/20 0954 04/26/20 0832 05/04/20 1130  05/17/20 0912  BILITOT 0.3 0.2* <0.2* 0.3  AST 13* 10* 11* 11*  ALT '9 7 6 ' <6  ALKPHOS 93 80 82 77  PROT 7.3 7.0 6.8 6.9  ALBUMIN 2.8* 2.7* 2.7* 2.8*     Assessment and Plan:  History of non-small cell lung cancer currently undergoing systemic chemotherapy as management via Port-a-cath placed in IR, with difficulty in aspiration of Port-a-cath and Port-a-cath injection in IR 03/11/2020 revealing a fibrin sheath on tip of Port-a-cath. Plan for image-guided Port-a-cath fibrin sheath stripping with possible Port-a-cath exchange/replacement today in IR. Patient is NPO. Afebrile.  Risks and benefits of image-guided Port-a-catheter fibrin sheath stripping with possible Port-a-cath exchange/replacement were discussed with the patient including, but not limited to bleeding, infection, pneumothorax, or fibrin sheath development and need for additional procedures. All of the patient's questions were answered, patient is agreeable to proceed. Consent signed and in chart.   Thank you for this interesting consult.  I greatly enjoyed meeting [name] and look forward to participating in their care.  A copy of this report was sent to the requesting provider on this date.  Electronically Signed: Earley Abide, PA-C 05/19/2020, 1:41 PM   I spent a total of 25 Minutes in face to face in clinical consultation, greater than 50% of which was counseling/coordinating care for Port-a-cath malfunction/fibrin sheath stripping.

## 2020-05-19 NOTE — Procedures (Signed)
Interventional Radiology Procedure Note  Procedure: Fibrin sheath stripping from portacatheter.   Complications: None.   Estimated Blood Loss: None.   Recommendations: - Bedrest x 2 hrs, right leg straight.  - DC home   Signed,  Criselda Peaches, MD

## 2020-05-19 NOTE — Discharge Instructions (Signed)
Please call Interventional Radiology clinic [phone] with any questions or concerns.  You may remove your dressing and shower tomorrow.   Femoral Site Care This sheet gives you information about how to care for yourself after your procedure. Your health care provider may also give you more specific instructions. If you have problems or questions, contact your health care provider. What can I expect after the procedure? After the procedure, it is common to have:  Bruising that usually fades within 1-2 weeks.  Tenderness at the site. Follow these instructions at home: Wound care  Follow instructions from your health care provider about how to take care of your insertion site. Make sure you: ? Wash your hands with soap and water before you change your bandage (dressing). If soap and water are not available, use hand sanitizer. ? Change your dressing as told by your health care provider. ? Leave stitches (sutures), skin glue, or adhesive strips in place. These skin closures may need to stay in place for 2 weeks or longer. If adhesive strip edges start to loosen and curl up, you may trim the loose edges. Do not remove adhesive strips completely unless your health care provider tells you to do that.  Do not take baths, swim, or use a hot tub until your health care provider approves.  You may shower 24-48 hours after the procedure or as told by your health care provider. ? Gently wash the site with plain soap and water. ? Pat the area dry with a clean towel. ? Do not rub the site. This may cause bleeding.  Do not apply powder or lotion to the site. Keep the site clean and dry.  Check your femoral site every day for signs of infection. Check for: ? Redness, swelling, or pain. ? Fluid or blood. ? Warmth. ? Pus or a bad smell. Activity  For the first 2-3 days after your procedure, or as long as directed: ? Avoid climbing stairs as much as possible. ? Do not squat.  Do not lift  anything that is heavier than 10 lb (4.5 kg), or the limit that you are told, until your health care provider says that it is safe.  Rest as directed. ? Avoid sitting for a long time without moving. Get up to take short walks every 1-2 hours.  Do not drive for 24 hours if you were given a medicine to help you relax (sedative). General instructions  Take over-the-counter and prescription medicines only as told by your health care provider.  Keep all follow-up visits as told by your health care provider. This is important. Contact a health care provider if you have:  A fever or chills.  You have redness, swelling, or pain around your insertion site. Get help right away if:  The catheter insertion area swells very fast.  You pass out.  You suddenly start to sweat or your skin gets clammy.  The catheter insertion area is bleeding, and the bleeding does not stop when you hold steady pressure on the area.  The area near or just beyond the catheter insertion site becomes pale, cool, tingly, or numb. These symptoms may represent a serious problem that is an emergency. Do not wait to see if the symptoms will go away. Get medical help right away. Call your local emergency services (911 in the [place] Do not drive yourself to the hospital. Summary  After the procedure, it is common to have bruising that usually fades within 1-2 weeks.  Check your femoral  site every day for signs of infection.  Do not lift anything that is heavier than 10 lb (4.5 kg), or the limit that you are told, until your health care provider says that it is safe. This information is not intended to replace advice given to you by your health care provider. Make sure you discuss any questions you have with your health care provider. Document Revised: 07/15/2017 Document Reviewed: 07/15/2017 Elsevier Patient Education  [place].   Moderate Conscious Sedation, Adult, Care After These instructions provide you  with information about caring for yourself after your procedure. Your health care provider may also give you more specific instructions. Your treatment has been planned according to current medical practices, but problems sometimes occur. Call your health care provider if you have any problems or questions after your procedure. What can I expect after the procedure? After your procedure, it is common:  To feel sleepy for several hours.  To feel clumsy and have poor balance for several hours.  To have poor judgment for several hours.  To vomit if you eat too soon. Follow these instructions at home: For at least 24 hours after the procedure:   Do not: ? Participate in activities where you could fall or become injured. ? Drive. ? Use heavy machinery. ? Drink alcohol. ? Take sleeping pills or medicines that cause drowsiness. ? Make important decisions or sign legal documents. ? Take care of children on your own.  Rest. Eating and drinking  Follow the diet recommended by your health care provider.  If you vomit: ? Drink water, juice, or soup when you can drink without vomiting. ? Make sure you have little or no nausea before eating solid foods. General instructions  Have a responsible adult stay with you until you are awake and alert.  Take over-the-counter and prescription medicines only as told by your health care provider.  If you smoke, do not smoke without supervision.  Keep all follow-up visits as told by your health care provider. This is important. Contact a health care provider if:  You keep feeling nauseous or you keep vomiting.  You feel light-headed.  You develop a rash.  You have a fever. Get help right away if:  You have trouble breathing. This information is not intended to replace advice given to you by your health care provider. Make sure you discuss any questions you have with your health care provider. Document Revised: 06/14/2017 Document Reviewed:  10/22/2015 Elsevier Patient Education  2020 [employer].

## 2020-05-21 ENCOUNTER — Other Ambulatory Visit: Payer: Self-pay | Admitting: Family Medicine

## 2020-05-21 DIAGNOSIS — I152 Hypertension secondary to endocrine disorders: Secondary | ICD-10-CM

## 2020-05-21 DIAGNOSIS — E1159 Type 2 diabetes mellitus with other circulatory complications: Secondary | ICD-10-CM

## 2020-05-24 ENCOUNTER — Inpatient Hospital Stay: Payer: Medicare Other

## 2020-05-24 ENCOUNTER — Other Ambulatory Visit: Payer: Self-pay

## 2020-05-24 VITALS — BP 122/54 | HR 81 | Temp 99.1°F | Resp 18

## 2020-05-24 DIAGNOSIS — Z8673 Personal history of transient ischemic attack (TIA), and cerebral infarction without residual deficits: Secondary | ICD-10-CM | POA: Diagnosis not present

## 2020-05-24 DIAGNOSIS — K219 Gastro-esophageal reflux disease without esophagitis: Secondary | ICD-10-CM | POA: Diagnosis not present

## 2020-05-24 DIAGNOSIS — F17210 Nicotine dependence, cigarettes, uncomplicated: Secondary | ICD-10-CM | POA: Diagnosis not present

## 2020-05-24 DIAGNOSIS — R110 Nausea: Secondary | ICD-10-CM | POA: Diagnosis not present

## 2020-05-24 DIAGNOSIS — Z95828 Presence of other vascular implants and grafts: Secondary | ICD-10-CM

## 2020-05-24 DIAGNOSIS — Z794 Long term (current) use of insulin: Secondary | ICD-10-CM | POA: Diagnosis not present

## 2020-05-24 DIAGNOSIS — Z7982 Long term (current) use of aspirin: Secondary | ICD-10-CM | POA: Diagnosis not present

## 2020-05-24 DIAGNOSIS — I10 Essential (primary) hypertension: Secondary | ICD-10-CM | POA: Diagnosis not present

## 2020-05-24 DIAGNOSIS — C3491 Malignant neoplasm of unspecified part of right bronchus or lung: Secondary | ICD-10-CM

## 2020-05-24 DIAGNOSIS — E1140 Type 2 diabetes mellitus with diabetic neuropathy, unspecified: Secondary | ICD-10-CM | POA: Diagnosis not present

## 2020-05-24 DIAGNOSIS — Z923 Personal history of irradiation: Secondary | ICD-10-CM | POA: Diagnosis not present

## 2020-05-24 DIAGNOSIS — D531 Other megaloblastic anemias, not elsewhere classified: Secondary | ICD-10-CM

## 2020-05-24 DIAGNOSIS — Z79899 Other long term (current) drug therapy: Secondary | ICD-10-CM | POA: Diagnosis not present

## 2020-05-24 DIAGNOSIS — C3411 Malignant neoplasm of upper lobe, right bronchus or lung: Secondary | ICD-10-CM | POA: Diagnosis not present

## 2020-05-24 DIAGNOSIS — Z5111 Encounter for antineoplastic chemotherapy: Secondary | ICD-10-CM | POA: Diagnosis not present

## 2020-05-24 LAB — CBC WITH DIFFERENTIAL (CANCER CENTER ONLY)
Abs Immature Granulocytes: 0.05 10*3/uL (ref 0.00–0.07)
Basophils Absolute: 0 10*3/uL (ref 0.0–0.1)
Basophils Relative: 0 %
Eosinophils Absolute: 0.2 10*3/uL (ref 0.0–0.5)
Eosinophils Relative: 2 %
HCT: 29.2 % — ABNORMAL LOW (ref 36.0–46.0)
Hemoglobin: 9.4 g/dL — ABNORMAL LOW (ref 12.0–15.0)
Immature Granulocytes: 1 %
Lymphocytes Relative: 9 %
Lymphs Abs: 0.8 10*3/uL (ref 0.7–4.0)
MCH: 26.7 pg (ref 26.0–34.0)
MCHC: 32.2 g/dL (ref 30.0–36.0)
MCV: 83 fL (ref 80.0–100.0)
Monocytes Absolute: 1.3 10*3/uL — ABNORMAL HIGH (ref 0.1–1.0)
Monocytes Relative: 14 %
Neutro Abs: 6.8 10*3/uL (ref 1.7–7.7)
Neutrophils Relative %: 74 %
Platelet Count: 688 10*3/uL — ABNORMAL HIGH (ref 150–400)
RBC: 3.52 MIL/uL — ABNORMAL LOW (ref 3.87–5.11)
RDW: 16 % — ABNORMAL HIGH (ref 11.5–15.5)
WBC Count: 9.2 10*3/uL (ref 4.0–10.5)
nRBC: 0 % (ref 0.0–0.2)

## 2020-05-24 LAB — CMP (CANCER CENTER ONLY)
ALT: <6 U/L (ref 0–44)
AST: 11 U/L — ABNORMAL LOW (ref 15–41)
Albumin: 2.6 g/dL — ABNORMAL LOW (ref 3.5–5.0)
Alkaline Phosphatase: 77 U/L (ref 38–126)
Anion gap: 7 (ref 5–15)
BUN: 13 mg/dL (ref 8–23)
CO2: 30 mmol/L (ref 22–32)
Calcium: 10.5 mg/dL — ABNORMAL HIGH (ref 8.9–10.3)
Chloride: 101 mmol/L (ref 98–111)
Creatinine: 0.81 mg/dL (ref 0.44–1.00)
GFR, Estimated: >60 mL/min (ref >60)
Glucose, Bld: 140 mg/dL — ABNORMAL HIGH (ref 70–99)
Potassium: 3.9 mmol/L (ref 3.5–5.1)
Sodium: 138 mmol/L (ref 135–145)
Total Bilirubin: 0.4 mg/dL (ref 0.3–1.2)
Total Protein: 6.6 g/dL (ref 6.5–8.1)

## 2020-05-24 MED ORDER — SODIUM CHLORIDE 0.9% FLUSH
10.0000 mL | INTRAVENOUS | Status: DC | PRN
  Administered 2020-05-24: 10 mL
  Filled 2020-05-24: qty 10

## 2020-05-24 MED ORDER — PROCHLORPERAZINE MALEATE 10 MG PO TABS
ORAL_TABLET | ORAL | Status: AC
  Filled 2020-05-24: qty 1

## 2020-05-24 MED ORDER — ALTEPLASE 2 MG IJ SOLR
2.0000 mg | Freq: Once | INTRAMUSCULAR | Status: AC | PRN
  Administered 2020-05-24: 2 mg
  Filled 2020-05-24: qty 2

## 2020-05-24 MED ORDER — ALTEPLASE 2 MG IJ SOLR
INTRAMUSCULAR | Status: AC
  Filled 2020-05-24: qty 2

## 2020-05-24 MED ORDER — SODIUM CHLORIDE 0.9 % IV SOLN
1000.0000 mg/m2 | Freq: Once | INTRAVENOUS | Status: AC
  Administered 2020-05-24: 1558 mg via INTRAVENOUS
  Filled 2020-05-24: qty 40.98

## 2020-05-24 MED ORDER — PROCHLORPERAZINE MALEATE 10 MG PO TABS
10.0000 mg | ORAL_TABLET | Freq: Once | ORAL | Status: AC
  Administered 2020-05-24: 10 mg via ORAL

## 2020-05-24 MED ORDER — SODIUM CHLORIDE 0.9 % IV SOLN
Freq: Once | INTRAVENOUS | Status: AC
  Filled 2020-05-24: qty 250

## 2020-05-24 MED ORDER — HEPARIN SOD (PORK) LOCK FLUSH 100 UNIT/ML IV SOLN
500.0000 [IU] | Freq: Once | INTRAVENOUS | Status: AC | PRN
  Administered 2020-05-24: 500 [IU]
  Filled 2020-05-24: qty 5

## 2020-05-24 NOTE — Patient Instructions (Signed)

## 2020-05-24 NOTE — Progress Notes (Signed)
Unable to aspirate blood from PAC. Labs drawn peripherally. Cathflo instilled @ 945a.

## 2020-05-24 NOTE — Patient Instructions (Signed)
[facility] Discharge Instructions for Patients Receiving Chemotherapy  Today you received the following chemotherapy agents Gemzar To help prevent nausea and vomiting after your treatment, we encourage you to take your nausea medication as prescribed.  If you develop nausea and vomiting that is not controlled by your nausea medication, call the clinic.   BELOW ARE SYMPTOMS THAT SHOULD BE REPORTED IMMEDIATELY:  *FEVER GREATER THAN 100.5 F  *CHILLS WITH OR WITHOUT FEVER  NAUSEA AND VOMITING THAT IS NOT CONTROLLED WITH YOUR NAUSEA MEDICATION  *UNUSUAL SHORTNESS OF BREATH  *UNUSUAL BRUISING OR BLEEDING  TENDERNESS IN MOUTH AND THROAT WITH OR WITHOUT PRESENCE OF ULCERS  *URINARY PROBLEMS  *BOWEL PROBLEMS  UNUSUAL RASH Items with * indicate a potential emergency and should be followed up as soon as possible.  Feel free to call the clinic should you have any questions or concerns. The clinic phone number is [phone].  Please show the CHEMO ALERT CARD at check-in to the Emergency Department and triage nurse.   

## 2020-06-03 ENCOUNTER — Inpatient Hospital Stay: Payer: Medicare Other

## 2020-06-03 ENCOUNTER — Other Ambulatory Visit: Payer: Self-pay

## 2020-06-03 ENCOUNTER — Telehealth: Payer: Self-pay | Admitting: Medical Oncology

## 2020-06-03 ENCOUNTER — Other Ambulatory Visit: Payer: Self-pay | Admitting: Medical Oncology

## 2020-06-03 DIAGNOSIS — C3491 Malignant neoplasm of unspecified part of right bronchus or lung: Secondary | ICD-10-CM

## 2020-06-03 DIAGNOSIS — E1140 Type 2 diabetes mellitus with diabetic neuropathy, unspecified: Secondary | ICD-10-CM | POA: Diagnosis not present

## 2020-06-03 DIAGNOSIS — C3411 Malignant neoplasm of upper lobe, right bronchus or lung: Secondary | ICD-10-CM | POA: Diagnosis not present

## 2020-06-03 DIAGNOSIS — Z8673 Personal history of transient ischemic attack (TIA), and cerebral infarction without residual deficits: Secondary | ICD-10-CM | POA: Diagnosis not present

## 2020-06-03 DIAGNOSIS — R110 Nausea: Secondary | ICD-10-CM

## 2020-06-03 DIAGNOSIS — F17210 Nicotine dependence, cigarettes, uncomplicated: Secondary | ICD-10-CM | POA: Diagnosis not present

## 2020-06-03 DIAGNOSIS — I10 Essential (primary) hypertension: Secondary | ICD-10-CM | POA: Diagnosis not present

## 2020-06-03 DIAGNOSIS — Z5111 Encounter for antineoplastic chemotherapy: Secondary | ICD-10-CM | POA: Diagnosis not present

## 2020-06-03 DIAGNOSIS — K219 Gastro-esophageal reflux disease without esophagitis: Secondary | ICD-10-CM | POA: Diagnosis not present

## 2020-06-03 DIAGNOSIS — Z7982 Long term (current) use of aspirin: Secondary | ICD-10-CM | POA: Diagnosis not present

## 2020-06-03 DIAGNOSIS — Z923 Personal history of irradiation: Secondary | ICD-10-CM | POA: Diagnosis not present

## 2020-06-03 DIAGNOSIS — Z79899 Other long term (current) drug therapy: Secondary | ICD-10-CM | POA: Diagnosis not present

## 2020-06-03 DIAGNOSIS — Z794 Long term (current) use of insulin: Secondary | ICD-10-CM | POA: Diagnosis not present

## 2020-06-03 LAB — CBC WITH DIFFERENTIAL (CANCER CENTER ONLY)
Abs Immature Granulocytes: 0.16 10*3/uL — ABNORMAL HIGH (ref 0.00–0.07)
Basophils Absolute: 0 10*3/uL (ref 0.0–0.1)
Basophils Relative: 0 %
Eosinophils Absolute: 0.2 10*3/uL (ref 0.0–0.5)
Eosinophils Relative: 2 %
HCT: 29.2 % — ABNORMAL LOW (ref 36.0–46.0)
Hemoglobin: 9.3 g/dL — ABNORMAL LOW (ref 12.0–15.0)
Immature Granulocytes: 2 %
Lymphocytes Relative: 9 %
Lymphs Abs: 0.8 10*3/uL (ref 0.7–4.0)
MCH: 26.3 pg (ref 26.0–34.0)
MCHC: 31.8 g/dL (ref 30.0–36.0)
MCV: 82.7 fL (ref 80.0–100.0)
Monocytes Absolute: 1 10*3/uL (ref 0.1–1.0)
Monocytes Relative: 10 %
Neutro Abs: 7.3 10*3/uL (ref 1.7–7.7)
Neutrophils Relative %: 77 %
Platelet Count: 345 10*3/uL (ref 150–400)
RBC: 3.53 MIL/uL — ABNORMAL LOW (ref 3.87–5.11)
RDW: 17.1 % — ABNORMAL HIGH (ref 11.5–15.5)
WBC Count: 9.4 10*3/uL (ref 4.0–10.5)
nRBC: 0 % (ref 0.0–0.2)

## 2020-06-03 LAB — CMP (CANCER CENTER ONLY)
ALT: <6 U/L (ref 0–44)
AST: 15 U/L (ref 15–41)
Albumin: 2.4 g/dL — ABNORMAL LOW (ref 3.5–5.0)
Alkaline Phosphatase: 75 U/L (ref 38–126)
Anion gap: 8 (ref 5–15)
BUN: 13 mg/dL (ref 8–23)
CO2: 29 mmol/L (ref 22–32)
Calcium: 10.7 mg/dL — ABNORMAL HIGH (ref 8.9–10.3)
Chloride: 101 mmol/L (ref 98–111)
Creatinine: 0.72 mg/dL (ref 0.44–1.00)
GFR, Estimated: >60 mL/min (ref >60)
Glucose, Bld: 105 mg/dL — ABNORMAL HIGH (ref 70–99)
Potassium: 3.7 mmol/L (ref 3.5–5.1)
Sodium: 138 mmol/L (ref 135–145)
Total Bilirubin: 0.3 mg/dL (ref 0.3–1.2)
Total Protein: 6.5 g/dL (ref 6.5–8.1)

## 2020-06-03 MED ORDER — SODIUM CHLORIDE 0.9 % IV SOLN
Freq: Once | INTRAVENOUS | Status: AC
  Filled 2020-06-03: qty 250

## 2020-06-03 NOTE — Telephone Encounter (Signed)
Dtr reports that [name] is declining- she is  disoriented ,confused in am , has no strength or energy. Not eating or drinking.  Schedule message sent for IVF today.

## 2020-06-03 NOTE — Patient Instructions (Signed)

## 2020-06-03 NOTE — Patient Instructions (Signed)

## 2020-06-06 ENCOUNTER — Telehealth: Payer: Self-pay | Admitting: Medical Oncology

## 2020-06-06 NOTE — Telephone Encounter (Signed)
Dtr cancelled appt for tomorrow.She is requesting Hospice . [name] is unable to walk and declining rapidly. She reports back pain and weakness.  Called in referral per Dr Julien Nordmann. Records faxed.

## 2020-06-07 ENCOUNTER — Inpatient Hospital Stay: Payer: Medicare Other

## 2020-06-07 ENCOUNTER — Inpatient Hospital Stay: Payer: Medicare Other | Admitting: Internal Medicine

## 2020-06-10 ENCOUNTER — Other Ambulatory Visit: Payer: Self-pay | Admitting: Oncology

## 2020-06-10 MED ORDER — OXYCODONE HCL 5 MG PO TABS: 5.0000 mg | ORAL_TABLET | ORAL | 0 refills | Status: DC | PRN

## 2020-06-10 MED ORDER — OXYCODONE HCL 5 MG PO TABS
5.0000 mg | ORAL_TABLET | ORAL | 0 refills | Status: DC | PRN
Start: 2020-06-10 — End: 2020-06-10

## 2020-06-10 MED ORDER — OXYCODONE HCL 5 MG PO TABS: 5.0000 mg | ORAL_TABLET | ORAL | 0 refills | Status: AC | PRN

## 2020-06-13 ENCOUNTER — Ambulatory Visit: Payer: Medicare Other | Admitting: Family Medicine

## 2020-06-14 ENCOUNTER — Inpatient Hospital Stay: Payer: Medicare Other

## 2020-06-17 ENCOUNTER — Telehealth: Payer: Self-pay | Admitting: Family Medicine

## 2020-06-17 NOTE — Telephone Encounter (Signed)
Pt's daughter called to let us know patient passed away yesterday.

## 2020-06-17 NOTE — Telephone Encounter (Signed)
Send a card

## 2020-06-20 ENCOUNTER — Telehealth: Payer: Self-pay | Admitting: Family Medicine

## 2020-06-20 NOTE — Telephone Encounter (Signed)
Sympathy card sent

## 2020-06-22 NOTE — Telephone Encounter (Signed)
Card sent 

## 2020-06-28 ENCOUNTER — Ambulatory Visit: Payer: Medicare Other

## 2020-06-28 ENCOUNTER — Other Ambulatory Visit: Payer: Medicare Other

## 2020-06-28 ENCOUNTER — Ambulatory Visit: Payer: Medicare Other | Admitting: Physician Assistant

## 2020-07-05 ENCOUNTER — Other Ambulatory Visit: Payer: Medicare Other

## 2020-07-05 ENCOUNTER — Ambulatory Visit: Payer: Medicare Other

## 2020-07-16 DEATH — deceased

## 2020-08-08 ENCOUNTER — Ambulatory Visit: Payer: Medicare Other | Admitting: Neurology
